# Patient Record
Sex: Male | Born: 1940 | Smoking: Former smoker
Health system: Southern US, Community
[De-identification: ages and names within clinical notes are randomized; demographics above are authoritative.]

## PROBLEM LIST (undated history)

## (undated) DIAGNOSIS — E119 Type 2 diabetes mellitus without complications: Secondary | ICD-10-CM

## (undated) DIAGNOSIS — N200 Calculus of kidney: Secondary | ICD-10-CM

## (undated) DIAGNOSIS — R112 Nausea with vomiting, unspecified: Secondary | ICD-10-CM

## (undated) DIAGNOSIS — Z9889 Other specified postprocedural states: Secondary | ICD-10-CM

## (undated) HISTORY — PX: SPLENECTOMY: SUR1306

## (undated) HISTORY — PX: KIDNEY SURGERY: SHX687

---

## 1998-03-15 ENCOUNTER — Encounter: Admission: RE | Admit: 1998-03-15 | Discharge: 1998-06-13 | Payer: Self-pay | Admitting: Emergency Medicine

## 2003-04-19 ENCOUNTER — Ambulatory Visit (HOSPITAL_COMMUNITY): Admission: RE | Admit: 2003-04-19 | Discharge: 2003-04-19 | Payer: Self-pay | Admitting: Family Medicine
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
## 2008-10-26 ENCOUNTER — Ambulatory Visit: Payer: Self-pay | Admitting: Family Medicine

## 2008-10-26 DIAGNOSIS — E119 Type 2 diabetes mellitus without complications: Secondary | ICD-10-CM

## 2008-10-26 DIAGNOSIS — R9431 Abnormal electrocardiogram [ECG] [EKG]: Secondary | ICD-10-CM | POA: Insufficient documentation

## 2008-10-26 DIAGNOSIS — E663 Overweight: Secondary | ICD-10-CM | POA: Insufficient documentation

## 2008-10-26 DIAGNOSIS — R5383 Other fatigue: Secondary | ICD-10-CM

## 2008-10-26 DIAGNOSIS — N3000 Acute cystitis without hematuria: Secondary | ICD-10-CM | POA: Insufficient documentation

## 2008-10-26 DIAGNOSIS — R5381 Other malaise: Secondary | ICD-10-CM

## 2008-10-26 DIAGNOSIS — F17200 Nicotine dependence, unspecified, uncomplicated: Secondary | ICD-10-CM

## 2008-10-26 DIAGNOSIS — D239 Other benign neoplasm of skin, unspecified: Secondary | ICD-10-CM | POA: Insufficient documentation

## 2008-10-26 DIAGNOSIS — R05 Cough: Secondary | ICD-10-CM | POA: Insufficient documentation

## 2008-10-27 ENCOUNTER — Encounter: Payer: Self-pay | Admitting: Family Medicine

## 2008-10-27 LAB — CONVERTED CEMR LAB: Microalb Creat Ratio: 24.6 mg/g (ref 0.0–30.0)

## 2010-05-07 LAB — CONVERTED CEMR LAB
ALT: 17 units/L (ref 0–53)
AST: 13 units/L (ref 0–37)
Basophils Absolute: 0 10*3/uL (ref 0.0–0.1)
Cholesterol: 207 mg/dL — ABNORMAL HIGH (ref 0–200)
Glucose, Bld: 230 mg/dL
Glucose, Urine, Semiquant: 500
Hgb A1c MFr Bld: 10.8 % — ABNORMAL HIGH (ref 4.6–6.1)
Indirect Bilirubin: 0.4 mg/dL (ref 0.0–0.9)
Lymphocytes Relative: 30 % (ref 12–46)
Neutro Abs: 7 10*3/uL (ref 1.7–7.7)
Neutrophils Relative %: 63 % (ref 43–77)
Platelets: 250 10*3/uL (ref 150–400)
Potassium: 4.2 meq/L (ref 3.5–5.3)
RDW: 13.8 % (ref 11.5–15.5)
Sodium: 138 meq/L (ref 135–145)
Specific Gravity, Urine: >=1.03
TSH: 0.903 microintl units/mL (ref 0.350–4.500)
Total CHOL/HDL Ratio: 5.4
Total Protein: 7.3 g/dL (ref 6.0–8.3)
Triglycerides: 304 mg/dL — ABNORMAL HIGH (ref <150)
VLDL: 61 mg/dL — ABNORMAL HIGH (ref 0–40)
WBC Urine, dipstick: NEGATIVE
pH: 5

## 2012-04-27 ENCOUNTER — Emergency Department (HOSPITAL_COMMUNITY)
Admission: EM | Admit: 2012-04-27 | Discharge: 2012-04-27 | Discharge status: Against Medical Advice | Payer: Medicare Other | Attending: Emergency Medicine | Admitting: Emergency Medicine

## 2012-04-27 ENCOUNTER — Emergency Department (HOSPITAL_COMMUNITY): Payer: Medicare Other

## 2012-04-27 ENCOUNTER — Encounter (HOSPITAL_COMMUNITY): Payer: Self-pay | Admitting: *Deleted

## 2012-04-27 DIAGNOSIS — N3289 Other specified disorders of bladder: Secondary | ICD-10-CM

## 2012-04-27 DIAGNOSIS — F17200 Nicotine dependence, unspecified, uncomplicated: Secondary | ICD-10-CM | POA: Insufficient documentation

## 2012-04-27 DIAGNOSIS — E119 Type 2 diabetes mellitus without complications: Secondary | ICD-10-CM | POA: Insufficient documentation

## 2012-04-27 DIAGNOSIS — R300 Dysuria: Secondary | ICD-10-CM | POA: Insufficient documentation

## 2012-04-27 DIAGNOSIS — Z87442 Personal history of urinary calculi: Secondary | ICD-10-CM | POA: Insufficient documentation

## 2012-04-27 DIAGNOSIS — R9389 Abnormal findings on diagnostic imaging of other specified body structures: Secondary | ICD-10-CM | POA: Insufficient documentation

## 2012-04-27 DIAGNOSIS — N390 Urinary tract infection, site not specified: Secondary | ICD-10-CM | POA: Insufficient documentation

## 2012-04-27 DIAGNOSIS — N509 Disorder of male genital organs, unspecified: Secondary | ICD-10-CM | POA: Insufficient documentation

## 2012-04-27 DIAGNOSIS — R319 Hematuria, unspecified: Secondary | ICD-10-CM

## 2012-04-27 HISTORY — DX: Calculus of kidney: N20.0

## 2012-04-27 HISTORY — DX: Type 2 diabetes mellitus without complications: E11.9

## 2012-04-27 LAB — CBC WITH DIFFERENTIAL/PLATELET
Eosinophils Absolute: 0 10*3/uL (ref 0.0–0.7)
Eosinophils Relative: 0 % (ref 0–5)
HCT: 41.7 % (ref 39.0–52.0)
Hemoglobin: 14.7 g/dL (ref 13.0–17.0)
Lymphocytes Relative: 31 % (ref 12–46)
Lymphs Abs: 3.4 10*3/uL (ref 0.7–4.0)
MCH: 32.2 pg (ref 26.0–34.0)
MCV: 91.4 fL (ref 78.0–100.0)
Monocytes Relative: 7 % (ref 3–12)
RBC: 4.56 MIL/uL (ref 4.22–5.81)
WBC: 10.9 10*3/uL — ABNORMAL HIGH (ref 4.0–10.5)

## 2012-04-27 LAB — URINALYSIS, ROUTINE W REFLEX MICROSCOPIC
Nitrite: POSITIVE — AB
Specific Gravity, Urine: 1.025 (ref 1.005–1.030)
pH: 5.5 (ref 5.0–8.0)

## 2012-04-27 LAB — BASIC METABOLIC PANEL
CO2: 27 mEq/L (ref 19–32)
Chloride: 100 mEq/L (ref 96–112)
Glucose, Bld: 237 mg/dL — ABNORMAL HIGH (ref 70–99)
Potassium: 3.7 mEq/L (ref 3.5–5.1)
Sodium: 136 mEq/L (ref 135–145)

## 2012-04-27 LAB — URINE MICROSCOPIC-ADD ON

## 2012-04-27 IMAGING — US US SCROTUM
1 series · 13 of 25 positions shown · non-contrast
Comparison: Stone study of earlier today.

CLINICAL DATA: Testicular pain. Right greater than left.
Swelling.  Rule out torsion.

SCROTAL ULTRASOUND
DOPPLER ULTRASOUND OF THE TESTICLES
TECHNIQUE: Complete ultrasound examination of the testicles,
epididymis, and other scrotal structures was performed.  Color and
spectral Doppler ultrasound were also utilized to evaluate blood
flow to the testicles.

[Series 1: us scrotum · 0.07mm/px · 13 of 60 slices shown]
[im 1/60]
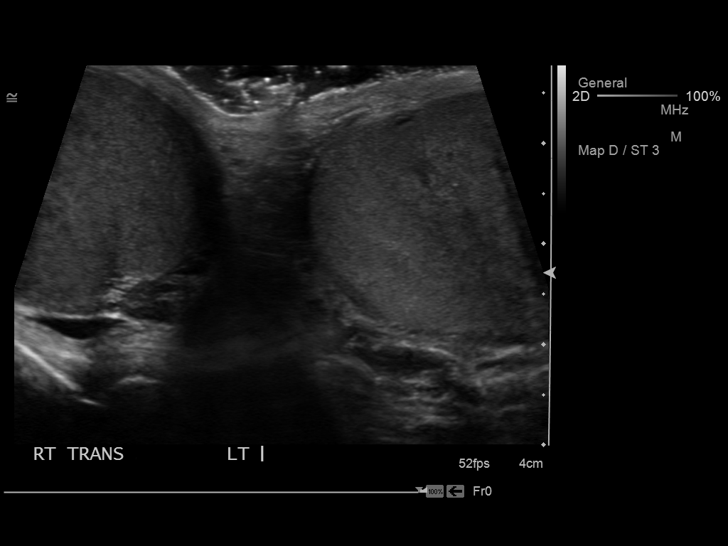
[im 5/60]
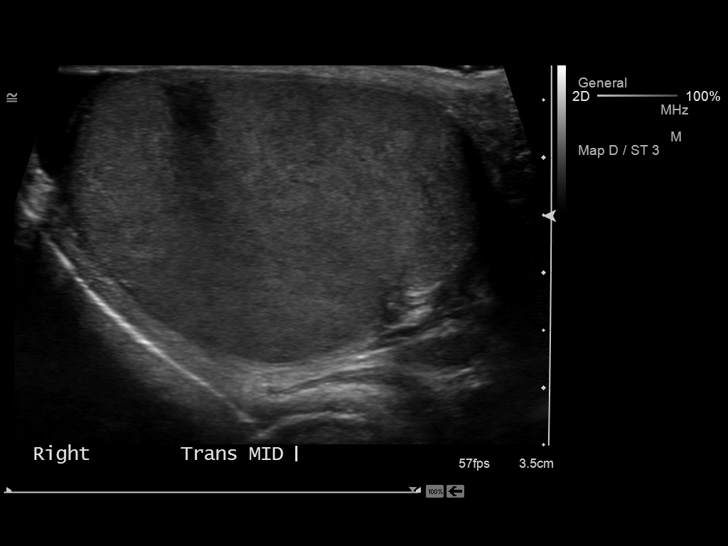
[im 10/60]
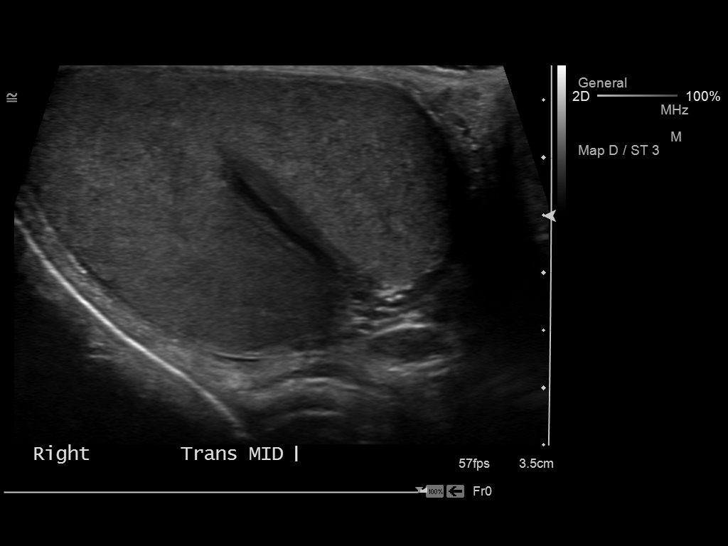
[im 15/60]
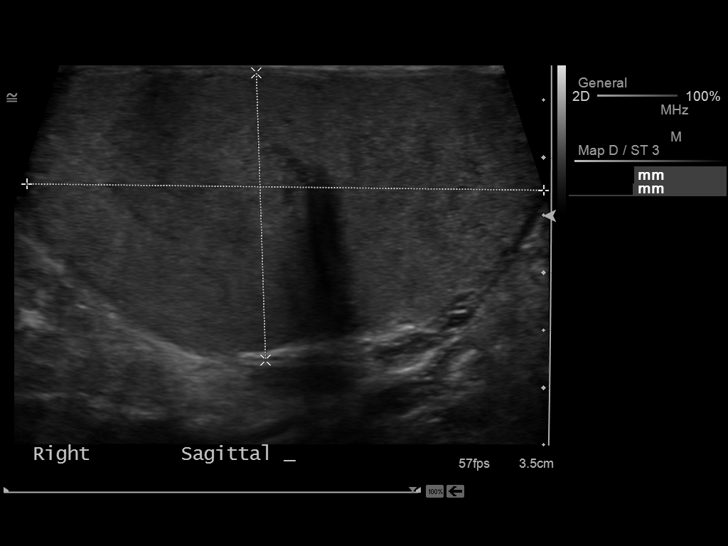
[im 20/60]
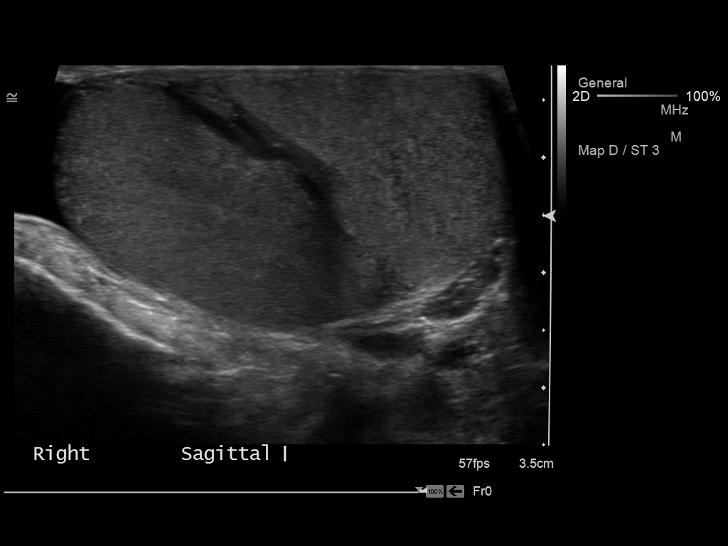
[im 25/60]
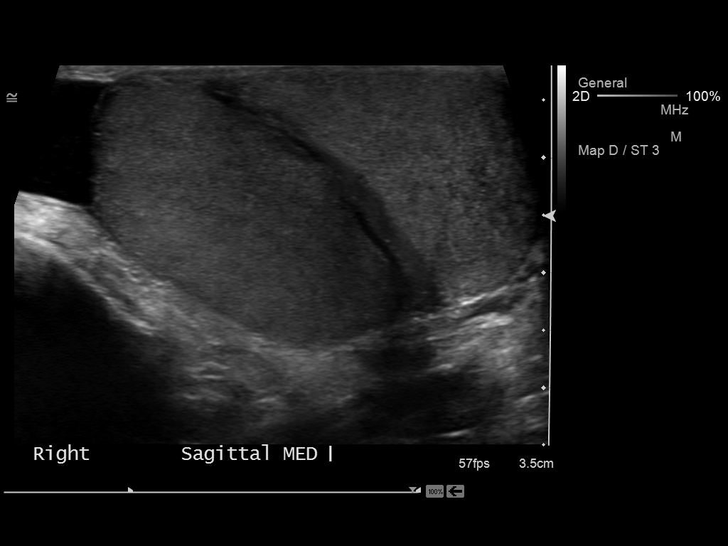
[im 30/60]
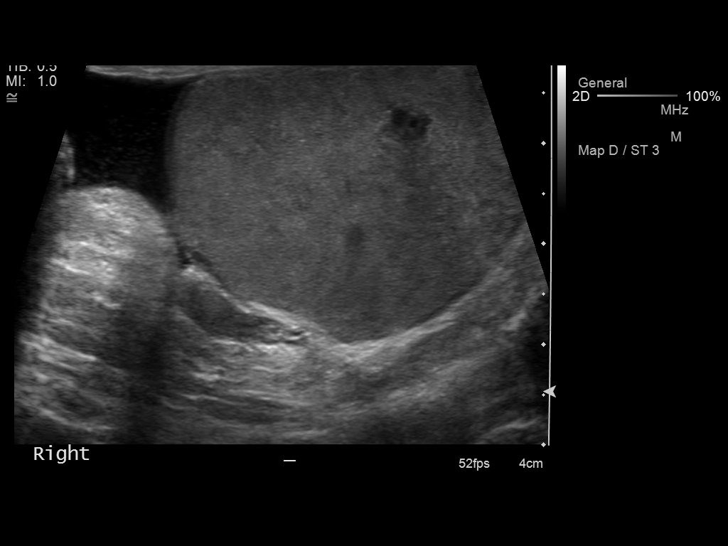
[im 35/60]
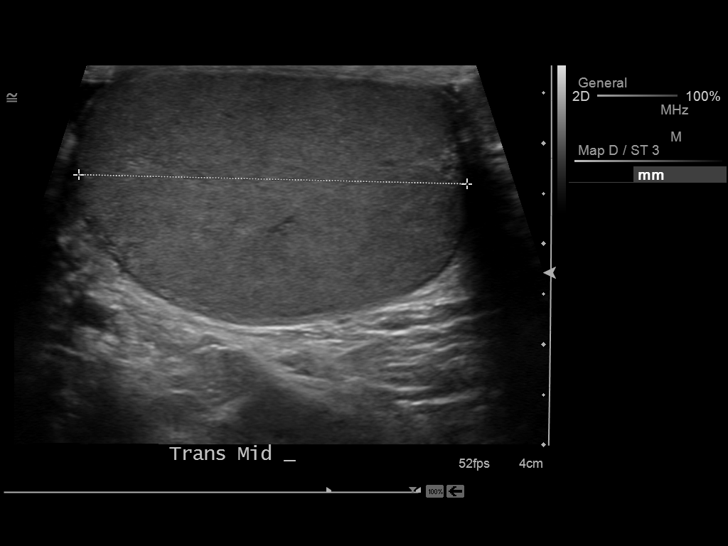
[im 40/60]
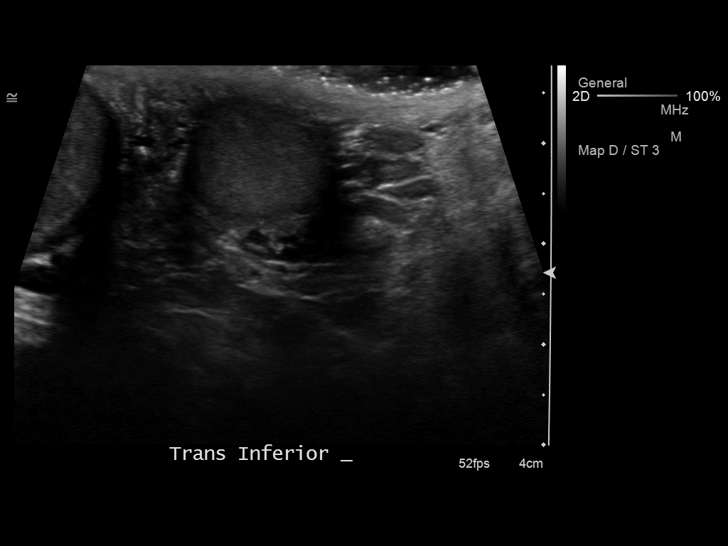
[im 45/60]
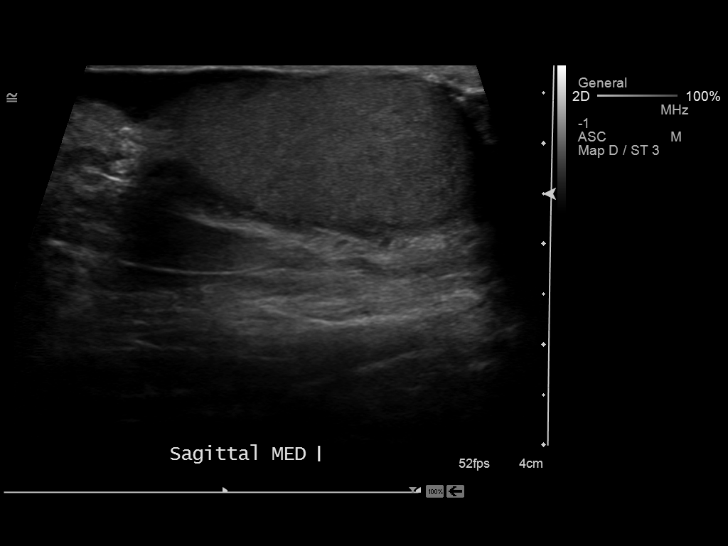
[im 50/60]
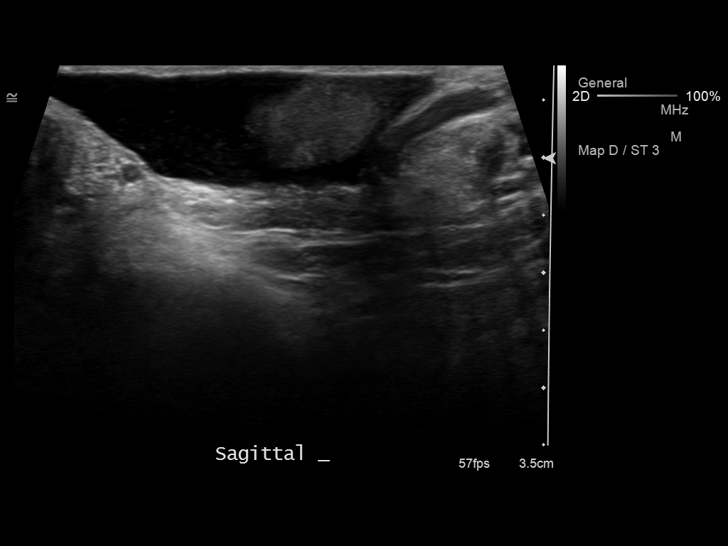
[im 55/60]
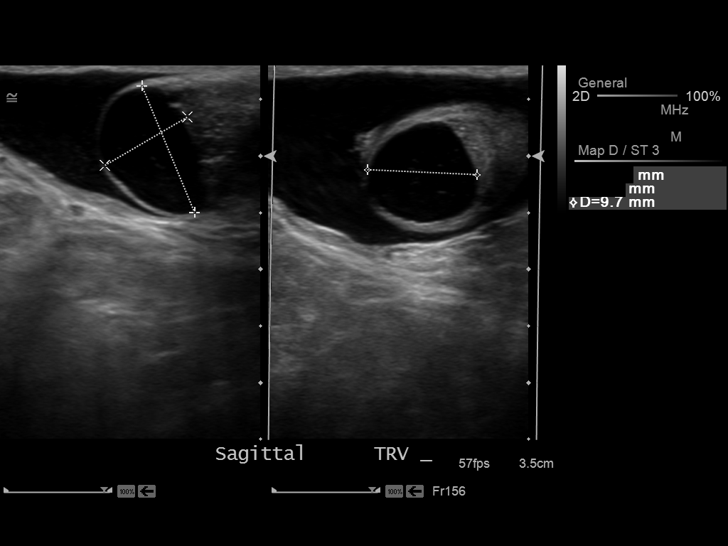
[im 60/60]
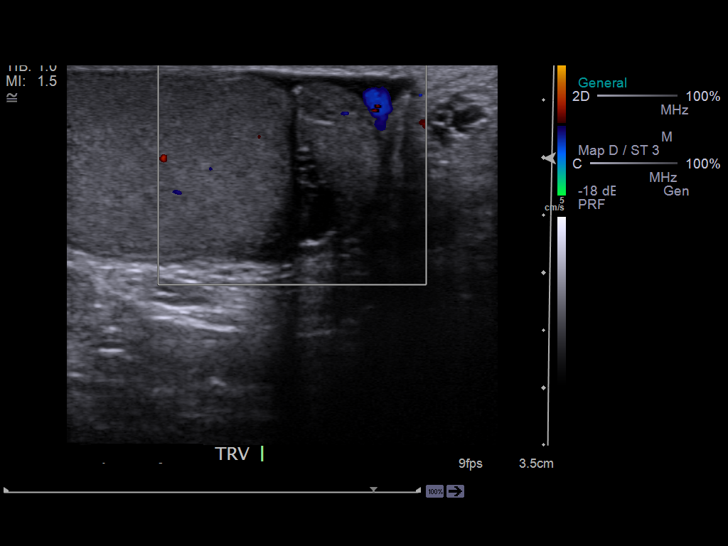

[13 of 25 positions shown; findings below may reference images not displayed]

FINDINGS: Right testis:  Normal in gray scale and color appearance.

Left testis:  Minimal testicular microlithiasis.  Otherwise normal
in size and appearance.  Normal color Doppler appearance.

Right epididymis:  Normal in size and appearance.

Left epididymis:  1.2 cm epididymal cyst versus spermatocele.

Hydrocele:  Small bilateral.

Varicocele:  Absent

Pulsed Doppler interrogation of both testes demonstrates low
resistance flow bilaterally.
IMPRESSION: 1. No acute process or explanation for testicular pain/swelling.
2.  Small bilateral hydroceles.
3.  Left epididymal cyst versus spermatocele.
4. Minimal left-sided testicular microlithiasis.  Current
literature suggests that testicular microlithiasis is not a
significant independent risk factor for development of testicular
carcinoma, and that followup imaging is not warranted in the
absence of other risk factors.  Monthly testicular self-examination
and annual physical exams are considered appropriate surveillance.
If patient has other risk factors for testicular carcinoma, then
referral to Urology should be considered.  (Reference:  ROBERTO,
et al.:  A 5-Year Followup Study of Asymptomatic Men with
Testicular Microlithiasis.  J Urol [SU]; 179:[PHONE_NUMBER].)

## 2012-04-27 IMAGING — CT CT ABD-PELV W/O CM
2 of 3 series · 17 of 46 positions shown, 19 images · non-contrast
Comparison: None.

CLINICAL DATA: Abdominal and pelvic pain and hematuria.

CT ABDOMEN AND PELVIS WITHOUT CONTRAST
TECHNIQUE: Multidetector CT imaging of the abdomen and pelvis was
performed following the standard protocol without intravenous
contrast.

[Series 2: standard/full over (age)lbs 5.0 · axial · 0.72mm/px · z∈[-451,-36]mm · 14 of 97 slices shown, 16 images]
[im 7/97  soft-tissue]
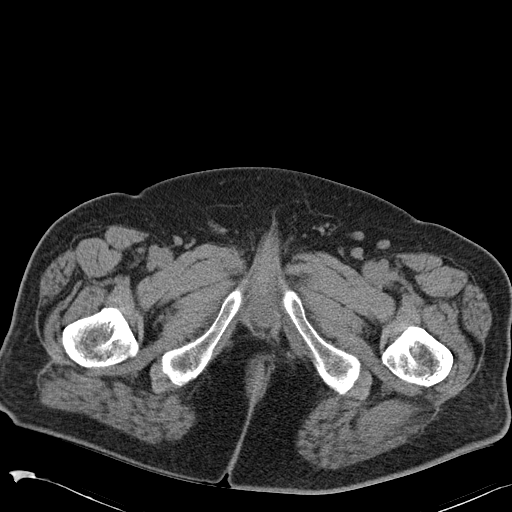
[im 7/97  bone]
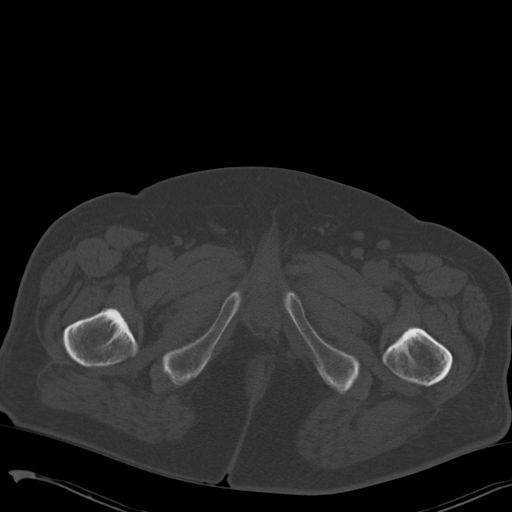
[im 13/97  soft-tissue]
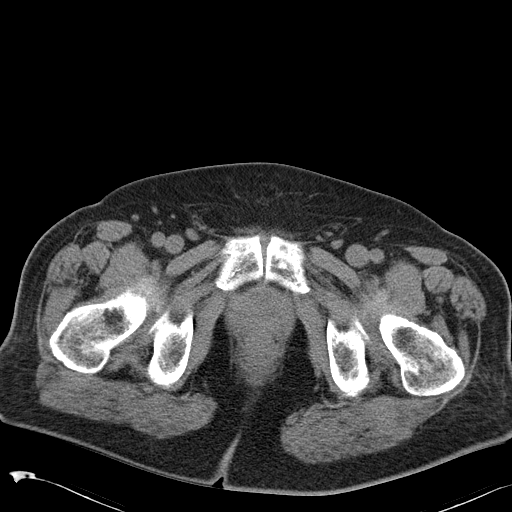
[im 19/97  soft-tissue]
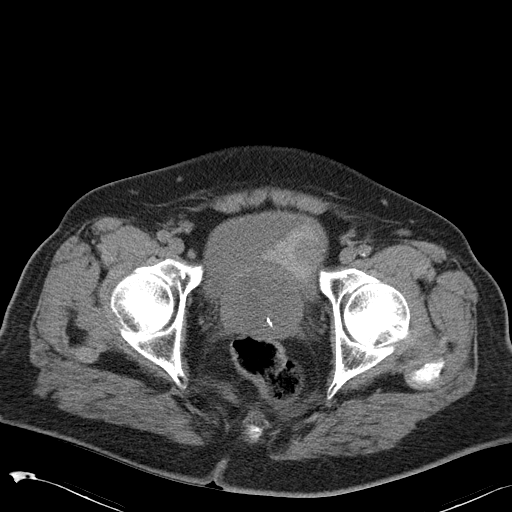
[im 25/97  soft-tissue]
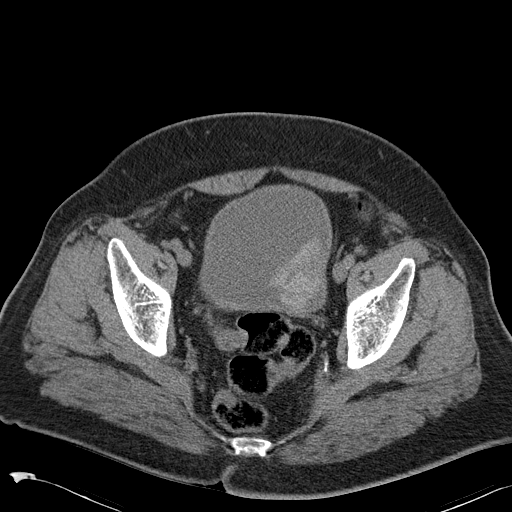
[im 31/97  soft-tissue]
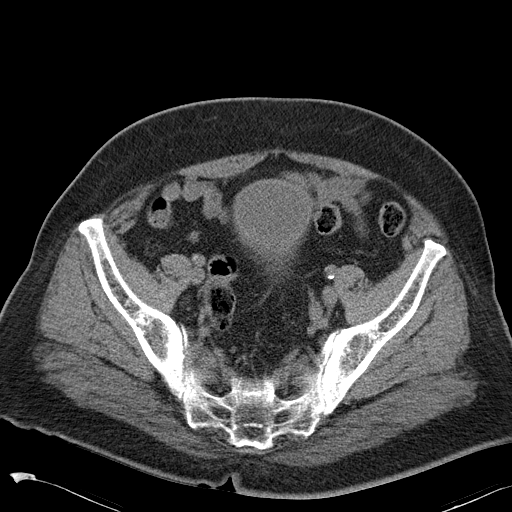
[im 38/97  soft-tissue]
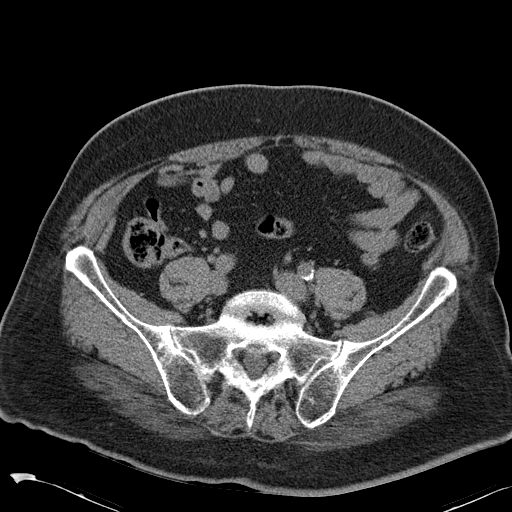
[im 44/97  soft-tissue]
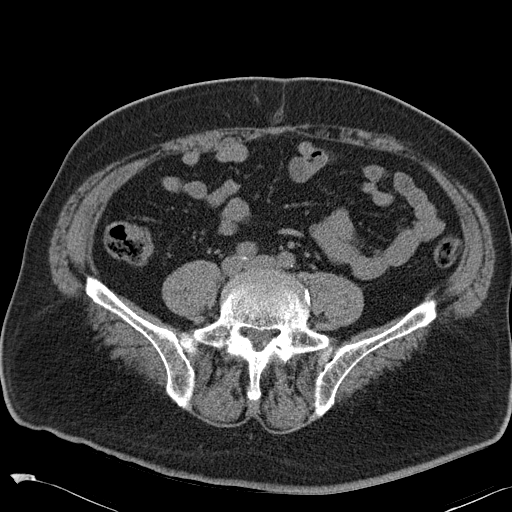
[im 53/97  soft-tissue]
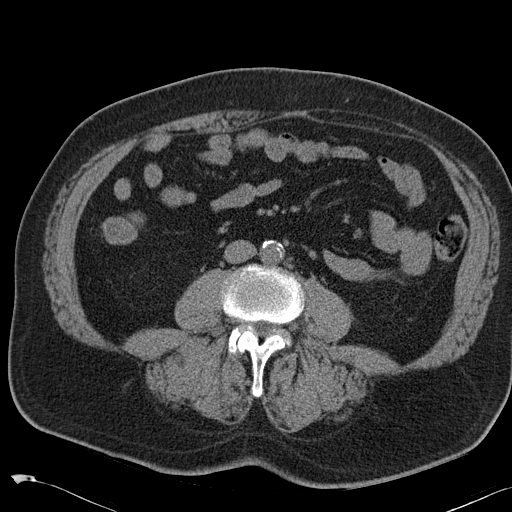
[im 59/97  soft-tissue]
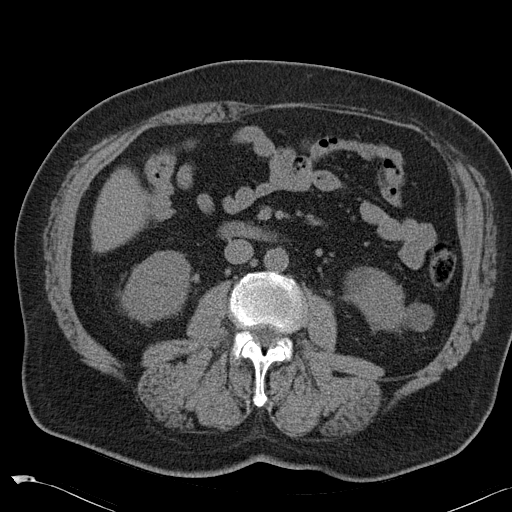
[im 59/97  bone]
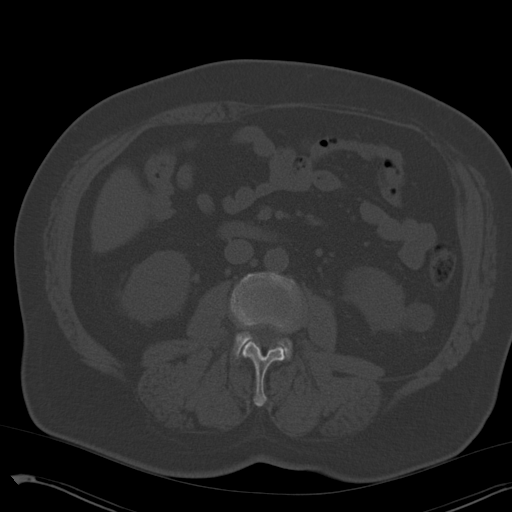
[im 66/97  soft-tissue]
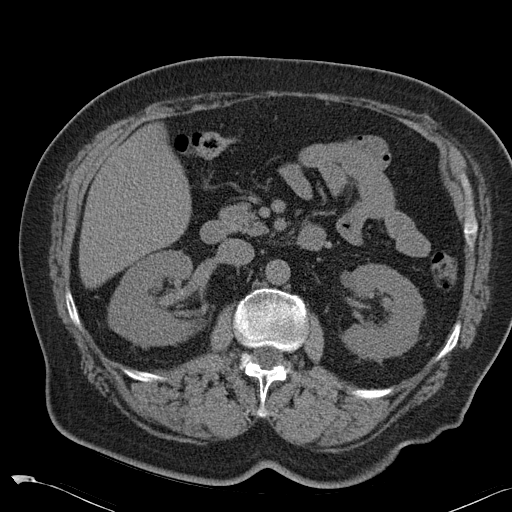
[im 72/97  soft-tissue]
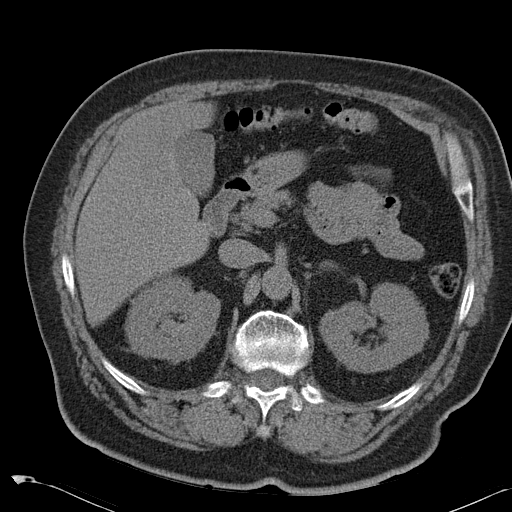
[im 78/97  soft-tissue]
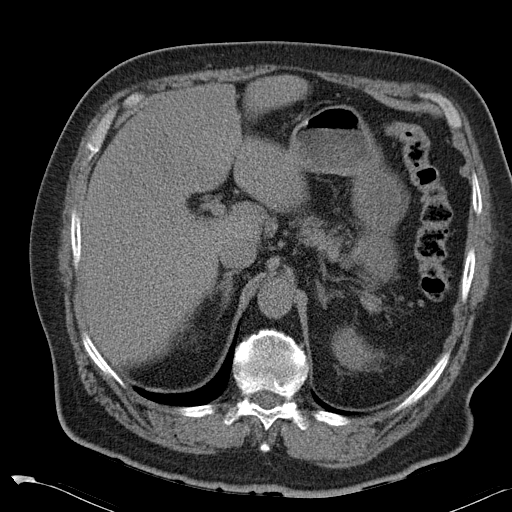
[im 84/97  soft-tissue]
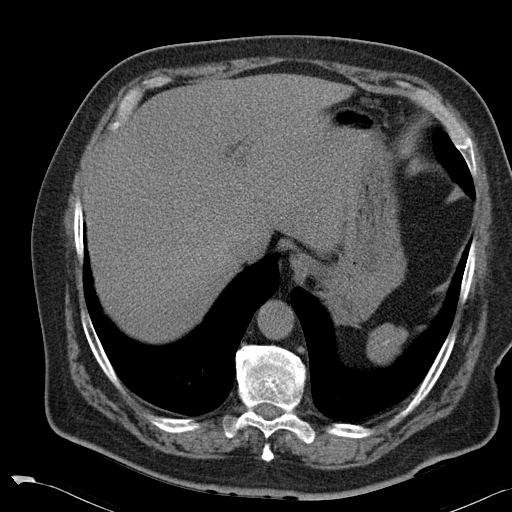
[im 90/97  soft-tissue]
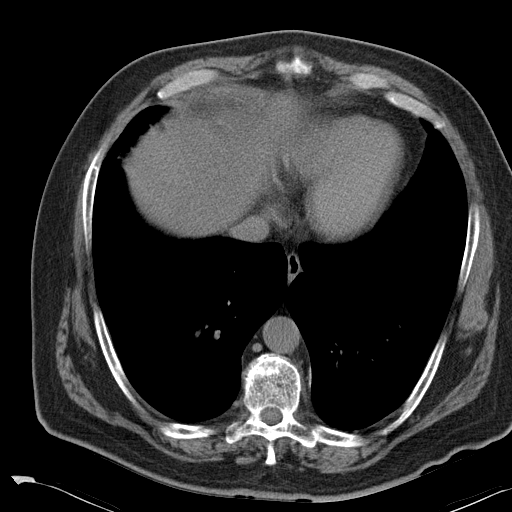

[Series 4: mpr coronal · coronal · 0.95mm/px · 3 of 102 slices shown]
[im 34/102  soft-tissue]
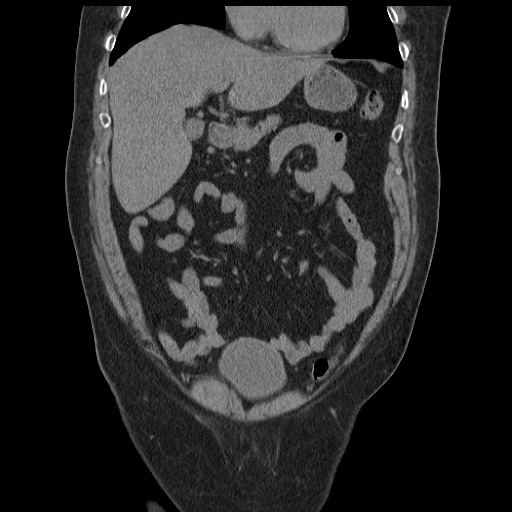
[im 45/102  soft-tissue]
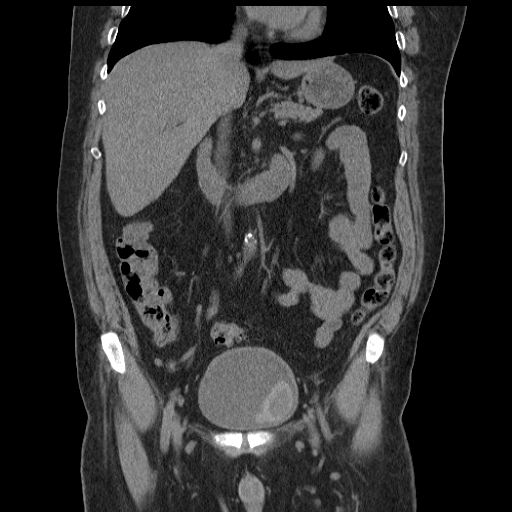
[im 57/102  soft-tissue]
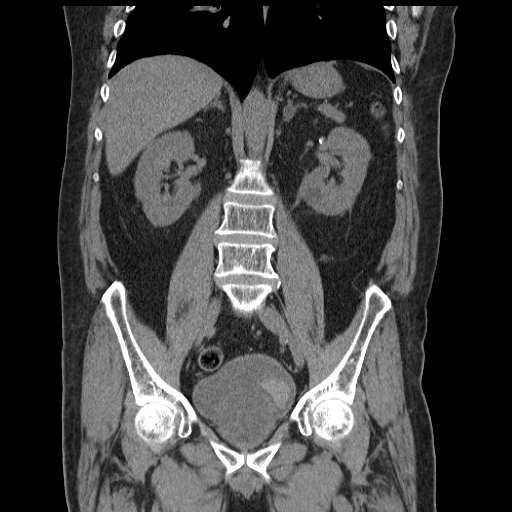

[17 of 46 positions shown; findings below may reference images not displayed]

FINDINGS: The lung bases are clear.  The heart is normal in size.
No pericardial effusion.  The distal esophagus is grossly normal as
is the distal descending thoracic aorta.

The liver is unremarkable.  No focal hepatic lesions or
intrahepatic biliary dilatation.  Gallbladder is normal.  No common
bile duct dilatation.  The pancreas is normal.  The spleen has been
surgically removed.  Accessory regenerated splenic tissue is noted
in the left upper quadrant.  Small left adrenal gland nodules are
consistent with benign adenomas.

There are renal arteriole calcifications but no renal or
obstructing ureteral calculi.  There is an ill-defined area of high
attenuation in the left aspect of the bladder.  Findings suspicious
for a bladder mass with surrounding hemorrhage.  The prostate gland
is mildly enlarged.  The seminal vesicles appear normal.  No pelvic
adenopathy.

The aorta is normal in caliber.  Scattered atherosclerotic
calcifications.  No mesenteric or retroperitoneal mass or
adenopathy.  Small scattered nodes are noted.

The bony structures are unremarkable.
IMPRESSION: 1.  CT findings suspicious for a left-sided bladder mass with
surrounding hemorrhage.
2.  Left renal cysts and left adrenal gland adenomas.

## 2012-04-27 MED ORDER — CIPROFLOXACIN IN D5W 400 MG/200ML IV SOLN
400.0000 mg | Freq: Once | INTRAVENOUS | Status: AC
  Administered 2012-04-27: 400 mg via INTRAVENOUS
  Filled 2012-04-27: qty 200

## 2012-04-27 MED ORDER — CIPROFLOXACIN HCL 500 MG PO TABS: 500.0000 mg | ORAL_TABLET | Freq: Two times a day (BID) | ORAL | Status: DC

## 2012-04-27 MED ORDER — ONDANSETRON HCL 4 MG/2ML IJ SOLN: 4.0000 mg | INTRAMUSCULAR | Status: DC | PRN

## 2012-04-27 MED ORDER — MORPHINE SULFATE 4 MG/ML IJ SOLN: 4.0000 mg | INTRAMUSCULAR | Status: DC | PRN

## 2012-04-27 MED ORDER — SODIUM CHLORIDE 0.9 % IV SOLN
INTRAVENOUS | Status: DC
  Administered 2012-04-27: 14:00:00 via INTRAVENOUS

## 2012-04-27 NOTE — ED Notes (Signed)
Hematuria with clots x 2 wks and testicle pain/swelling.

## 2012-04-27 NOTE — ED Notes (Signed)
Waiting for ax to finish before  Signing pt out.

## 2012-04-27 NOTE — ED Provider Notes (Signed)
History     CSN: [id number]  Arrival date & time 04/27/12  1059   First MD Initiated Contact with Patient 04/27/12 1316      Chief Complaint  Patient presents with  . Hematuria     HPI Pt was seen at 1320.   Per pt, c/o gradual onset and persistence of constant hematuria for the past 2 to 3 weeks.  Has been associated with dysuria and waxing and waning R>L testicular "pain."  Denies abd pain, no back pain, no N/V/D, no fevers, no rash, no penile drainage, no injury.    Past Medical History  Diagnosis Date  . Diabetes mellitus without complication   . Kidney stones     Past Surgical History  Procedure Date  . Splenectomy   . Kidney surgery     sutures      History  Substance Use Topics  . Smoking status: Current Every Day Smoker    Types: Cigarettes  . Smokeless tobacco: Not on file  . Alcohol Use: No      Review of Systems ROS: Statement: All systems negative except as marked or noted in the HPI; Constitutional: Negative for fever and chills. ; ; Eyes: Negative for eye pain, redness and discharge. ; ; ENMT: Negative for ear pain, hoarseness, nasal congestion, sinus pressure and sore throat. ; ; Cardiovascular: Negative for chest pain, palpitations, diaphoresis, dyspnea and peripheral edema. ; ; Respiratory: Negative for cough, wheezing and stridor. ; ; Gastrointestinal: Negative for nausea, vomiting, diarrhea, abdominal pain, blood in stool, hematemesis, jaundice and rectal bleeding. . ; ; Genitourinary: Negative for flank pain, +dysuria, hematuria. ; ; Genital:  No penile drainage or rash,+right testicular pain, no swelling, no perineal pain, no scrotal rash or swelling.;;  Musculoskeletal: Negative for back pain and neck pain. Negative for swelling and trauma.; ; Skin: Negative for pruritus, rash, abrasions, blisters, bruising and skin lesion.; ; Neuro: Negative for headache, lightheadedness and neck stiffness. Negative for weakness, altered level of consciousness ,  altered mental status, extremity weakness, paresthesias, involuntary movement, seizure and syncope.       Allergies  Review of patient's allergies indicates no known allergies.  Home Medications  No current outpatient prescriptions on file.  BP 138/67  Pulse 76  Temp 97.7 F (36.5 C) (Oral)  Resp 20  Ht 5\' 8"  (1.727 m)  Wt 205 lb (92.987 kg)  BMI 31.17 kg/m2  SpO2 95%  Physical Exam 1325: Physical examination:  Nursing notes reviewed; Vital signs and O2 SAT reviewed;  Constitutional: Well developed, Well nourished, Well hydrated, In no acute distress; Head:  Normocephalic, atraumatic; Eyes: EOMI, PERRL, No scleral icterus; ENMT: Mouth and pharynx normal, Mucous membranes moist; Neck: Supple, Full range of motion, No lymphadenopathy; Cardiovascular: Regular rate and rhythm, No murmur, rub, or gallop; Respiratory: Breath sounds clear & equal bilaterally, No rales, rhonchi, wheezes.  Speaking full sentences with ease, Normal respiratory effort/excursion; Chest: Nontender, Movement normal; Abdomen: Soft, Nontender, Nondistended, Normal bowel sounds; Genitourinary: No CVA tenderness; Genital exam performed with pt permission and male ED RN chaparone present during exam. No perineal erythema, soft tissue crepitus or tenderness to palp.  No penile lesions or drainage.  No scrotal erythema, edema or tenderness to palp.  Normal testicular lie.  No testicular tenderness to palp.  +cremasteric reflexes bilat.  No inguinal LAN or palpable masses.;;  Extremities: Pulses normal, No tenderness, No edema, No calf edema or asymmetry.; Neuro: AA&Ox3, Major CN grossly intact.  Speech clear. No gross focal  motor or sensory deficits in extremities.; Skin: Color normal, Warm, Dry.   ED Course  Procedures    MDM  MDM Reviewed: previous chart, nursing note and vitals Interpretation: labs, x-ray and ultrasound   Results for orders placed during the hospital encounter of 04/27/12  URINALYSIS, ROUTINE W  REFLEX MICROSCOPIC      Component Value Range   Color, Urine AMBER (*) YELLOW   APPearance HAZY (*) CLEAR   Specific Gravity, Urine 1.025  1.005 - 1.030   pH 5.5  5.0 - 8.0   Glucose, UA >1000 (*) NEGATIVE mg/dL   Hgb urine dipstick LARGE (*) NEGATIVE   Bilirubin Urine SMALL (*) NEGATIVE   Ketones, ur 15 (*) NEGATIVE mg/dL   Protein, ur >409 (*) NEGATIVE mg/dL   Urobilinogen, UA 0.2  0.0 - 1.0 mg/dL   Nitrite POSITIVE (*) NEGATIVE   Leukocytes, UA NEGATIVE  NEGATIVE  URINE MICROSCOPIC-ADD ON      Component Value Range   WBC, UA 11-20  <3 WBC/hpf   RBC / HPF 21-50  <3 RBC/hpf   Bacteria, UA MANY (*) RARE  BASIC METABOLIC PANEL      Component Value Range   Sodium 136  135 - 145 mEq/L   Potassium 3.7  3.5 - 5.1 mEq/L   Chloride 100  96 - 112 mEq/L   CO2 27  19 - 32 mEq/L   Glucose, Bld 237 (*) 70 - 99 mg/dL   BUN 13  6 - 23 mg/dL   Creatinine, Ser 8.11  0.50 - 1.35 mg/dL   Calcium 9.6  8.4 - 91.4 mg/dL   GFR calc non Af Amer >90  >90 mL/min   GFR calc Af Amer >90  >90 mL/min  CBC WITH DIFFERENTIAL      Component Value Range   WBC 10.9 (*) 4.0 - 10.5 K/uL   RBC 4.56  4.22 - 5.81 MIL/uL   Hemoglobin 14.7  13.0 - 17.0 g/dL   HCT 78.2  95.6 - 21.3 %   MCV 91.4  78.0 - 100.0 fL   MCH 32.2  26.0 - 34.0 pg   MCHC 35.3  30.0 - 36.0 g/dL   RDW 08.6  57.8 - 46.9 %   Platelets 306  150 - 400 K/uL   Neutrophils Relative 61  43 - 77 %   Neutro Abs 6.6  1.7 - 7.7 K/uL   Lymphocytes Relative 31  12 - 46 %   Lymphs Abs 3.4  0.7 - 4.0 K/uL   Monocytes Relative 7  3 - 12 %   Monocytes Absolute 0.8  0.1 - 1.0 K/uL   Eosinophils Relative 0  0 - 5 %   Eosinophils Absolute 0.0  0.0 - 0.7 K/uL   Basophils Relative 1  0 - 1 %   Basophils Absolute 0.1  0.0 - 0.1 K/uL   Ct Abdomen Pelvis Wo Contrast 04/27/2012  *RADIOLOGY REPORT*  Clinical Data: Abdominal and pelvic pain and hematuria.  CT ABDOMEN AND PELVIS WITHOUT CONTRAST  Technique:  Multidetector CT imaging of the abdomen and pelvis was  performed following the standard protocol without intravenous contrast.  Comparison: None.  Findings: The lung bases are clear.  The heart is normal in size. No pericardial effusion.  The distal esophagus is grossly normal as is the distal descending thoracic aorta.  The liver is unremarkable.  No focal hepatic lesions or intrahepatic biliary dilatation.  Gallbladder is normal.  No common bile duct dilatation.  The pancreas  is normal.  The spleen has been surgically removed.  Accessory regenerated splenic tissue is noted in the left upper quadrant.  Small left adrenal gland nodules are consistent with benign adenomas.  There are renal arteriole calcifications but no renal or obstructing ureteral calculi.  There is an ill-defined area of high attenuation in the left aspect of the bladder.  Findings suspicious for a bladder mass with surrounding hemorrhage.  The prostate gland is mildly enlarged.  The seminal vesicles appear normal.  No pelvic adenopathy.  The aorta is normal in caliber.  Scattered atherosclerotic calcifications.  No mesenteric or retroperitoneal mass or adenopathy.  Small scattered nodes are noted.  The bony structures are unremarkable.  IMPRESSION:  1.  CT findings suspicious for a left-sided bladder mass with surrounding hemorrhage. 2.  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   Original Report Authenticated By: Rudie Meyer, M.D.    US Scrotum 04/27/2012  *RADIOLOGY REPORT*  Clinical Data:  Testicular pain. Right greater than left. Swelling.  Rule out torsion.  SCROTAL ULTRASOUND DOPPLER ULTRASOUND OF THE TESTICLES  Technique: Complete ultrasound examination of the testicles, epididymis, and other scrotal structures was performed.  Color and spectral Doppler ultrasound were also utilized to evaluate blood flow to the testicles.  Comparison:  Stone study of earlier today.  Findings:  Right testis:  Normal in gray scale and color appearance.  Left testis:  Minimal testicular  microlithiasis.  Otherwise normal in size and appearance.  Normal color Doppler appearance.  Right epididymis:  Normal in size and appearance.  Left epididymis:  1.2 cm epididymal cyst versus spermatocele.  Hydrocele:  Small bilateral.  Varicocele:  Absent  Pulsed Doppler interrogation of both testes demonstrates low resistance flow bilaterally.  IMPRESSION:  1. No acute process or explanation for testicular pain/swelling. 2.  Small bilateral hydroceles. 3.  Left epididymal cyst versus spermatocele. 4. Minimal left-sided testicular microlithiasis.  Current literature suggests that testicular microlithiasis is not a significant independent risk factor for development of testicular carcinoma, and that followup imaging is not warranted in the absence of other risk factors.  Monthly testicular self-examination and annual physical exams are considered appropriate surveillance. If patient has other risk factors for testicular carcinoma, then referral to Urology should be considered.  (Reference:  DeCastro, et al.:  A 5-Year Followup Study of Asymptomatic Men with Testicular Microlithiasis.  J Urol 2008; 179:1420-1423.)   Original Report Authenticated By: Jeronimo Greaves, M.D.      1515:   T/C to Uro Dr. Jerre Simon, case discussed, including:  HPI, pertinent PM/SHx, VS/PE, dx testing, ED course and treatment:  requests to admit to hospital service and he will scope tomorrow, obtain UC and tx UTI with cipro.    1530:  Dx and testing d/w pt and family.  Questions answered.  Verb understanding, agreeable to admit. T/C to [facility] Dr. Kerry Hough, case discussed, including:  HPI, pertinent PM/SHx, VS/PE, dx testing, ED course and treatment:  Agreeable to admit, requests to write temporary orders, obtain medical bed to team 2.  1550:  Pt now states he "has to go home and take care of the dogs."  ED RN and myself, as well as his ex-wife, encouraged pt to make arrangements with friends/family to do same.  Pt refuses to do so saying he has  to do it himself, and continues to refuse admission.  I encouraged pt to stay to have his bleeding bladder mass further evaluated, but continues to refuse.  Pt makes his own medical  decisions.  Risks of AMA explained to pt and family, including, but not limited to:  uncontrolled hemorrhage, cancer (local, metastatic), stroke, heart attack, cardiac arrythmia ("irregular heart rate/beat"), "passing out," temporary and/or permanent disability, death.  Pt and family verb understanding and continue to refuse admission, understanding the consequences of their decision.  I encouraged pt to follow up with the Uro MD tomorrow and return to the ED immediately if symptoms worsen, he changes his mind about admission, or for any other concerns.  Pt and family verb understanding, agreeable.              Laray Anger, DO 04/29/12 1323

## 2012-04-27 NOTE — ED Notes (Signed)
Pt states he has to go home and take care of things at home, atleast for tonight. He states there is no way he can stay at this time.

## 2012-04-28 LAB — URINE CULTURE

## 2012-05-01 ENCOUNTER — Encounter (HOSPITAL_COMMUNITY): Payer: Self-pay | Admitting: Pharmacy Technician

## 2012-05-05 ENCOUNTER — Encounter (HOSPITAL_COMMUNITY)
Admission: RE | Admit: 2012-05-05 | Discharge: 2012-05-05 | Disposition: A | Discharge status: Home / Self Care | Payer: Medicare Other | Source: Ambulatory Visit | Attending: Urology | Admitting: Urology

## 2012-05-05 ENCOUNTER — Encounter (HOSPITAL_COMMUNITY): Payer: Self-pay

## 2012-05-05 LAB — SURGICAL PCR SCREEN: MRSA, PCR: NEGATIVE

## 2012-05-05 NOTE — Patient Instructions (Addendum)
RAYSHUN KANDLER  05/05/2012   Your procedure is scheduled on:  05/08/2012  Report to [facility] at  900 AM.  Call this number if you have problems the morning of surgery: [phone]   Remember:   Do not eat food or drink liquids after midnight.   Take these medicines the morning of surgery with A SIP OF WATER: none   Do not wear jewelry, make-up or nail polish.  Do not wear lotions, powders, or perfumes.  Do not shave 48 hours prior to surgery. Men may shave face and neck.  Do not bring valuables to the hospital.  Contacts, dentures or bridgework may not be worn into surgery.  Leave suitcase in the car. After surgery it may be brought to your room.  For patients admitted to the hospital, checkout time is 11:00 AM the day of discharge.   Patients discharged the day of surgery will not be allowed to drive  home.  Name and phone number of your driver: family  Special Instructions: Shower using CHG 2 nights before surgery and the night before surgery.  If you shower the day of surgery use CHG.  Use special wash - you have one bottle of CHG for all showers.  You should use approximately 1/3 of the bottle for each shower.   Please read over the following fact sheets that you were given: Pain Booklet, Coughing and Deep Breathing, MRSA Information, Surgical Site Infection Prevention, Anesthesia Post-op Instructions and Care and Recovery After Surgery Transurethral Resection, Bladder Tumor A cancerous growth (tumor) can develop on the inside wall of the bladder. The bladder is the organ that holds urine. One way to remove the tumor is a procedure called a transurethral resection. The tumor is removed (resected) through the tube that carries urine from the bladder out of the body (urethra). No cuts (incisions) are made in the skin. Instead, the procedure is done through a thin telescope, called a resectoscope. Attached to it is a light and usually a tiny camera. The resectoscope is put into the  urethra. In men, the urethra opens at the end of the penis. In women, it opens just above the vagina.  A transurethral resection is usually used to remove tumors that have not gotten too big or too deep. These are called Stage 0, Stage 1 or Stage 2 bladder cancers. LET YOUR CAREGIVER KNOW ABOUT:  On the day of the procedure, your caregivers will need to know the last time you had anything to eat or drink. This includes water, gum, and candy. In advance, make sure they know about:   Any allergies.  All medications you are taking, including:  Herbs, eyedrops, over-the-counter medications and creams.  Blood thinners (anticoagulants), aspirin or other drugs that could affect blood clotting.  Use of steroids (by mouth or as creams).  Previous problems with anesthetics, including local anesthetics.  Possibility of pregnancy, if this applies.  Any history of blood clots.  Any history of bleeding or other blood problems.  Previous surgery.  Smoking history.  Any recent symptoms of colds or infections.  Other health problems. RISKS AND COMPLICATIONS This is usually a safe procedure. Every procedure has risks, though. For a transurethral resection, they include:  Infection. Antibiotic medication would need to be taken.  Bleeding.  Light bleeding may last for several days after the procedure.  If bleeding continues or is heavy, the bladder may need rinsing. Or, a new catheter might be put in for  awhile.  Sometimes bed rest is needed.  Urination problems.  Pain and burning can occur when urinating. This usually goes away in a few days.  Scarring from the procedure can block the flow of urine.  Bladder damage.  It can be punctured or torn during removal of the tumor. If this happens, a catheter might be needed for longer. Antibiotics would be taken while the bladder heals.  Urine can leak through the hole or tear into the abdomen. If this happens, surgery may be needed to  repair the bladder. BEFORE THE PROCEDURE   A medical evaluation will be done. This may include:  A physical examination.  Urine test. This is to make sure you do not have a urinary tract infection.  Blood tests.  A test that checks the heart's rhythm (electrocardiogram).  Talking with an anesthesiologist. This is the person who will be in charge of the medication (anesthesia) to keep you from feeling pain during the transurethral resection. You might be asleep during the procedure (general anesthesia) or numb from the waist down, but awake during the procedure (spinal anesthesia). Ask your surgeon what to expect.  The person who is having a transurethral resection needs to give what is called informed consent. This requires signing a legal paper that gives permission for the procedure. To give informed consent:  You must understand how the procedure is done and why.  You must be told all the risks and benefits of the procedure.  You must sign the consent. Sometimes a legal guardian can do this.  Signing should be witnessed by a healthcare professional.  The day before the surgery, eat only a light dinner. Then, do not eat or drink anything for at least 8 hours before the surgery. Ask your caregiver if it is OK to take any needed medicines with a sip of water.  Arrive at least an hour before the surgery or whenever your surgeon recommends. This will give you time to check in and fill out any needed paperwork. PROCEDURE  The preparation:  You will change into a hospital gown.  A needle will be inserted in your arm. This is an intravenous access tube (IV). Medication will be able to flow directly into your body through this needle.  Small monitors will be put on your body. They are used to check your heart, blood pressure, and oxygen level.  You might be given medication that will help you relax (sedative).  You will be given a general anesthetic or spinal anesthesia.  The  procedure:  Once you are asleep or numb from the waist down, your legs will be placed in stirrups.  The resectoscope will be passed through the urethra into the bladder.  Fluid will be passed through the resectoscope. This will fill the bladder with water.  The surgeon will examine the bladder through the scope. If the scope has a camera, it can take pictures from inside the bladder. They can be projected onto a TV screen.  The surgeon will use various tools to remove the tumor in small pieces. Sometimes a laser (a beam of light energy) is used. Other tools may use electric current.  A tube (catheter) will often be placed so that urine can drain into a bag outside the body. This process helps stop bleeding. This tube keeps blood clots from blocking the urethra.  The procedure usually takes 30 to 45 minutes. AFTER THE PROCEDURE   You will stay in a recovery area until the anesthesia has  worn off. Your blood pressure and pulse will be checked every so often. Then you will be taken to a hospital room.  You may continue to get fluids through the IV for awhile.  Some pain is normal. The catheter might be uncomfortable. Pain is usually not severe. If it is, ask for pain medicine.  Your urine may look bloody after a transurethral resection. This is normal.  If bleeding is heavy, a hospital caregiver may rinse out the bladder (irrigation) through the catheter.  Once the urine is clear, the catheter will be taken out.  You will need to stay in the hospital until you can urinate on your own.  Most people stay in the hospital for up to 4 days. PROGNOSIS   Transurethral resection is considered the best way to treat bladder tumors that are not too far along. For most people, the treatment is successful. Sometimes, though, more treatment is needed.  Bladder cancers can come back even after a successful procedure. Because of this, be sure to have a checkup with your caregiver every 3 to 6  months. If everything is OK for 3 years, you can reduce the checkups to once a year. Document Released: 01/20/2009 Document Revised: 06/18/2011 Document Reviewed: 01/20/2009 [facility] Patient Information 2013 [place], [place]. PATIENT INSTRUCTIONS POST-ANESTHESIA  IMMEDIATELY FOLLOWING SURGERY:  Do not drive or operate machinery for the first twenty four hours after surgery.  Do not make any important decisions for twenty four hours after surgery or while taking narcotic pain medications or sedatives.  If you develop intractable nausea and vomiting or a severe headache please notify your doctor immediately.  FOLLOW-UP:  Please make an appointment with your surgeon as instructed. You do not need to follow up with anesthesia unless specifically instructed to do so.  WOUND CARE INSTRUCTIONS (if applicable):  Keep a dry clean dressing on the anesthesia/puncture wound site if there is drainage.  Once the wound has quit draining you may leave it open to air.  Generally you should leave the bandage intact for twenty four hours unless there is drainage.  If the epidural site drains for more than 36-48 hours please call the anesthesia department.  QUESTIONS?:  Please feel free to call your physician or the hospital operator if you have any questions, and they will be happy to assist you.

## 2012-05-07 NOTE — OR Nursing (Signed)
Dr. Jerre Simon notified  To see if this patient will need mitomycine -C. Per Dr.Javiad he won't know until after he looks into the pt. then will make this decision. Kathlene November in pharmacy notified of possibility of this need.

## 2012-05-08 ENCOUNTER — Ambulatory Visit (HOSPITAL_COMMUNITY): Payer: Medicare Other | Admitting: Anesthesiology

## 2012-05-08 ENCOUNTER — Ambulatory Visit (HOSPITAL_COMMUNITY)
Admission: RE | Admit: 2012-05-08 | Discharge: 2012-05-08 | Disposition: A | Discharge status: Home / Self Care | Payer: Medicare Other | Source: Ambulatory Visit | Attending: Urology | Admitting: Urology

## 2012-05-08 ENCOUNTER — Encounter (HOSPITAL_COMMUNITY)
Admission: RE | Disposition: A | Discharge status: Home / Self Care | Payer: Self-pay | Source: Ambulatory Visit | Attending: Urology

## 2012-05-08 ENCOUNTER — Encounter (HOSPITAL_COMMUNITY): Payer: Self-pay | Admitting: *Deleted

## 2012-05-08 ENCOUNTER — Encounter (HOSPITAL_COMMUNITY): Payer: Self-pay | Admitting: Anesthesiology

## 2012-05-08 DIAGNOSIS — N320 Bladder-neck obstruction: Secondary | ICD-10-CM | POA: Insufficient documentation

## 2012-05-08 DIAGNOSIS — Z01812 Encounter for preprocedural laboratory examination: Secondary | ICD-10-CM | POA: Insufficient documentation

## 2012-05-08 DIAGNOSIS — N138 Other obstructive and reflux uropathy: Secondary | ICD-10-CM | POA: Insufficient documentation

## 2012-05-08 DIAGNOSIS — Z01810 Encounter for preprocedural cardiovascular examination: Secondary | ICD-10-CM | POA: Insufficient documentation

## 2012-05-08 DIAGNOSIS — E119 Type 2 diabetes mellitus without complications: Secondary | ICD-10-CM | POA: Insufficient documentation

## 2012-05-08 DIAGNOSIS — N401 Enlarged prostate with lower urinary tract symptoms: Secondary | ICD-10-CM | POA: Insufficient documentation

## 2012-05-08 DIAGNOSIS — N3289 Other specified disorders of bladder: Secondary | ICD-10-CM | POA: Insufficient documentation

## 2012-05-08 HISTORY — PX: CYSTOSCOPY: SHX5120

## 2012-05-08 LAB — GLUCOSE, CAPILLARY: Glucose-Capillary: 204 mg/dL — ABNORMAL HIGH (ref 70–99)

## 2012-05-08 SURGERY — CYSTOSCOPY
Anesthesia: General | Site: Bladder | Wound class: Clean Contaminated

## 2012-05-08 MED ORDER — FENTANYL CITRATE 0.05 MG/ML IJ SOLN
INTRAMUSCULAR | Status: AC
  Filled 2012-05-08: qty 2

## 2012-05-08 MED ORDER — MIDAZOLAM HCL 5 MG/5ML IJ SOLN
INTRAMUSCULAR | Status: DC | PRN
  Administered 2012-05-08: 2 mg via INTRAVENOUS

## 2012-05-08 MED ORDER — ONDANSETRON HCL 4 MG/2ML IJ SOLN: 4.0000 mg | Freq: Once | INTRAMUSCULAR | Status: DC | PRN

## 2012-05-08 MED ORDER — ONDANSETRON HCL 4 MG/2ML IJ SOLN
INTRAMUSCULAR | Status: AC
  Filled 2012-05-08: qty 2

## 2012-05-08 MED ORDER — LIDOCAINE HCL (PF) 1 % IJ SOLN
INTRAMUSCULAR | Status: AC
  Filled 2012-05-08: qty 5

## 2012-05-08 MED ORDER — MIDAZOLAM HCL 2 MG/2ML IJ SOLN
INTRAMUSCULAR | Status: AC
  Filled 2012-05-08: qty 2

## 2012-05-08 MED ORDER — STERILE WATER FOR IRRIGATION IR SOLN
Status: DC | PRN
  Administered 2012-05-08: 1000 mL

## 2012-05-08 MED ORDER — FENTANYL CITRATE 0.05 MG/ML IJ SOLN
25.0000 ug | INTRAMUSCULAR | Status: DC | PRN
  Administered 2012-05-08 (×2): 25 ug via INTRAVENOUS

## 2012-05-08 MED ORDER — FENTANYL CITRATE 0.05 MG/ML IJ SOLN
INTRAMUSCULAR | Status: DC | PRN
  Administered 2012-05-08: 25 ug via INTRAVENOUS
  Administered 2012-05-08: 50 ug via INTRAVENOUS
  Administered 2012-05-08: 75 ug via INTRAVENOUS

## 2012-05-08 MED ORDER — GLYCINE 1.5 % IR SOLN
Status: DC | PRN
  Administered 2012-05-08 (×7): 3000 mL

## 2012-05-08 MED ORDER — BUPIVACAINE IN DEXTROSE 0.75-8.25 % IT SOLN
INTRATHECAL | Status: AC
  Filled 2012-05-08: qty 2

## 2012-05-08 MED ORDER — ONDANSETRON HCL 4 MG/2ML IJ SOLN
4.0000 mg | Freq: Once | INTRAMUSCULAR | Status: AC
  Administered 2012-05-08: 4 mg via INTRAVENOUS

## 2012-05-08 MED ORDER — FENTANYL CITRATE 0.05 MG/ML IJ SOLN
25.0000 ug | INTRAMUSCULAR | Status: DC | PRN
  Administered 2012-05-08: 50 ug via INTRAVENOUS

## 2012-05-08 MED ORDER — MIDAZOLAM HCL 2 MG/2ML IJ SOLN
1.0000 mg | INTRAMUSCULAR | Status: DC | PRN
  Administered 2012-05-08: 2 mg via INTRAVENOUS

## 2012-05-08 MED ORDER — PROPOFOL 10 MG/ML IV BOLUS
INTRAVENOUS | Status: DC | PRN
  Administered 2012-05-08: 160 mg via INTRAVENOUS

## 2012-05-08 MED ORDER — LACTATED RINGERS IV SOLN
INTRAVENOUS | Status: DC
  Administered 2012-05-08: 10:00:00 via INTRAVENOUS

## 2012-05-08 SURGICAL SUPPLY — 35 items
BAG DECANTER FOR FLEXI CONT (MISCELLANEOUS) ×3 IMPLANT
BAG DRAIN URO TABLE W/ADPT NS (DRAPE) ×3 IMPLANT
BAG URINE DRAINAGE (UROLOGICAL SUPPLIES) ×3 IMPLANT
CABLE HI FREQUENCY MONOPOLAR (ELECTROSURGICAL) ×3 IMPLANT
CATH FOLEY 2WAY SLVR  5CC 18FR (CATHETERS) ×1
CATH FOLEY 2WAY SLVR  5CC 20FR (CATHETERS)
CATH FOLEY 2WAY SLVR 5CC 18FR (CATHETERS) ×2 IMPLANT
CATH FOLEY 2WAY SLVR 5CC 20FR (CATHETERS) IMPLANT
CLOTH BEACON ORANGE TIMEOUT ST (SAFETY) ×3 IMPLANT
CONNECTOR 5 IN 1 STRAIGHT STRL (MISCELLANEOUS) ×3 IMPLANT
ELECT CUT LOOP C-MAX 27FR .012 (CUTTING LOOP) ×3
ELECTRODE CUT LP CMX 27FR .012 (CUTTING LOOP) ×2 IMPLANT
FORMALIN 10 PREFIL 120ML (MISCELLANEOUS) IMPLANT
GLOVE BIO SURGEON STRL SZ7 (GLOVE) ×3 IMPLANT
GLOVE BIOGEL PI IND STRL 7.0 (GLOVE) ×2 IMPLANT
GLOVE BIOGEL PI IND STRL 7.5 (GLOVE) ×2 IMPLANT
GLOVE BIOGEL PI INDICATOR 7.0 (GLOVE) ×1
GLOVE BIOGEL PI INDICATOR 7.5 (GLOVE) ×1
GLOVE ECLIPSE 6.5 STRL STRAW (GLOVE) ×3 IMPLANT
GLOVE ECLIPSE 7.0 STRL STRAW (GLOVE) ×3 IMPLANT
GLOVE SS BIOGEL STRL SZ 6.5 (GLOVE) ×2 IMPLANT
GLOVE SUPERSENSE BIOGEL SZ 6.5 (GLOVE) ×1
GLYCINE 1.5% IRRIG UROMATIC (IV SOLUTION) ×21 IMPLANT
GOWN STRL REIN XL XLG (GOWN DISPOSABLE) ×9 IMPLANT
IV NS IRRIG 3000ML ARTHROMATIC (IV SOLUTION) ×3 IMPLANT
KIT ROOM TURNOVER AP CYSTO (KITS) ×3 IMPLANT
MANIFOLD NEPTUNE II (INSTRUMENTS) ×3 IMPLANT
PACK CYSTO (CUSTOM PROCEDURE TRAY) ×3 IMPLANT
PAD ARMBOARD 7.5X6 YLW CONV (MISCELLANEOUS) ×3 IMPLANT
PAD TELFA 3X4 1S STER (GAUZE/BANDAGES/DRESSINGS) IMPLANT
SET IRRIGATING DISP (SET/KITS/TRAYS/PACK) ×3 IMPLANT
SYR 30ML LL (SYRINGE) ×3 IMPLANT
TOWEL OR 17X26 4PK STRL BLUE (TOWEL DISPOSABLE) ×3 IMPLANT
WATER STERILE IRR 1000ML POUR (IV SOLUTION) ×3 IMPLANT
YANKAUER SUCT BULB TIP 10FT TU (MISCELLANEOUS) ×3 IMPLANT

## 2012-05-08 NOTE — Anesthesia Preprocedure Evaluation (Addendum)
Anesthesia Evaluation  Patient identified by MRN, date of birth, ID band Patient awake    Reviewed: Allergy & Precautions, H&P , NPO status , Patient's Chart, lab work & pertinent test results  Airway Mallampati: I TM Distance: >3 FB     Dental  (+) Edentulous Upper and Edentulous Lower   Pulmonary neg pulmonary ROS, former smoker,  breath sounds clear to auscultation        Cardiovascular negative cardio ROS  Rhythm:Regular Rate:Normal     Neuro/Psych    GI/Hepatic negative GI ROS,   Endo/Other  diabetes, Type 2  Renal/GU Renal disease     Musculoskeletal   Abdominal   Peds  Hematology   Anesthesia Other Findings   Reproductive/Obstetrics                           Anesthesia Physical Anesthesia Plan  ASA: II  Anesthesia Plan: General   Post-op Pain Management:    Induction: Intravenous  Airway Management Planned: LMA  Additional Equipment:   Intra-op Plan:   Post-operative Plan: Extubation in OR  Informed Consent: I have reviewed the patients History and Physical, chart, labs and discussed the procedure including the risks, benefits and alternatives for the proposed anesthesia with the patient or authorized representative who has indicated his/her understanding and acceptance.     Plan Discussed with:   Anesthesia Plan Comments:        Anesthesia Quick Evaluation

## 2012-05-08 NOTE — Anesthesia Postprocedure Evaluation (Signed)
  Anesthesia Post-op Note  Patient: Thomas Garcia  Procedure(s) Performed: Procedure(s) (LRB) with comments: TRANSURETHRAL RESECTION OF BLADDER TUMOR (TURBT) (N/A)  Patient Location: PACU  Anesthesia Type:General  Level of Consciousness: awake, alert  and patient cooperative  Airway and Oxygen Therapy: Patient Spontanous Breathing and Patient connected to face mask oxygen  Post-op Pain: none  Post-op Assessment: Post-op Vital signs reviewed, Patient's Cardiovascular Status Stable, Respiratory Function Stable, Patent Airway, No signs of Nausea or vomiting and Pain level controlled  Post-op Vital Signs: Reviewed and stable  Complications: No apparent anesthesia complications

## 2012-05-08 NOTE — Anesthesia Procedure Notes (Signed)
Procedure Name: LMA Insertion Date/Time: 05/08/2012 10:25 AM Performed by: Despina Hidden Pre-anesthesia Checklist: Emergency Drugs available, Patient identified, Suction available and Patient being monitored Patient Re-evaluated:Patient Re-evaluated prior to inductionOxygen Delivery Method: Circle system utilized Preoxygenation: Pre-oxygenation with 100% oxygen Intubation Type: IV induction Ventilation: Mask ventilation without difficulty LMA Size: 4.0 Tube type: Oral Number of attempts: 1 Placement Confirmation: breath sounds checked- equal and bilateral and positive ETCO2 Tube secured with: Tape Dental Injury: Teeth and Oropharynx as per pre-operative assessment
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
## 2012-05-08 NOTE — Progress Notes (Signed)
No change in H&P on reexamination. 

## 2012-05-08 NOTE — Progress Notes (Signed)
Standard drainage bag changed to leg bag. Stat lock applied to R thigh. Pt instructed on foley care . Verbalized understanding and showed understanding through teach back method.

## 2012-05-08 NOTE — Op Note (Signed)
NAME:  Thomas Garcia, Thomas Garcia               ACCOUNT NO.:  [id number]  MEDICAL RECORD NO.:  [id number]  LOCATION:  [facility]                          FACILITY:  [facility]  PHYSICIAN:  Ky Barban, M.D.DATE OF BIRTH:  10/08/1940  DATE OF PROCEDURE: DATE OF DISCHARGE:                              OPERATIVE REPORT   PREOPERATIVE DIAGNOSIS:  Large blood clot organized, rule out bladder tumor.  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  No bladder tumor.  PROCEDURE:  Cystoscopy and evacuation of blood clots.  ANESTHESIA:  Spinal.  DESCRIPTION OF PROCEDURE:  The patient was under spinal anesthesia in lithotomy position.  After usual prep and drape, a #28 Iglesias resectoscope was introduced into the bladder.  He has a large organized blood clot sitting on the trigone.  I could not see behind it.  With the cold knife, I was able to cut the blood clot out, looked behind it and there was no tumor.  There was just inflammation behind and he has a very large prostate with very large median lobe which is bleeding.  Some of these bleeders were coagulated.  After making sure that he does have any bladder tumor, I cut the blood clot out and evacuated with the help of Ellik evacuator.  At the end, I looked into the bladder.  All the clot is out.  I decided to leave a Foley catheter in because he is still oozing blood from the prostatic urethra and #18 Foley catheter left in for drainage.  The patient left the operating room in satisfactory condition.     Ky Barban, M.D.     MIJ/MEDQ  D:  05/08/2012  T:  05/08/2012  Job:  [id number]

## 2012-05-08 NOTE — Anesthesia Postprocedure Evaluation (Signed)
  Anesthesia Post-op Note  Patient: Thomas Garcia  Procedure(s) Performed: Procedure(s) (LRB) with comments: TRANSURETHRAL RESECTION OF BLADDER TUMOR (TURBT) (N/A)  Patient Location: PACU  Anesthesia Type:General  Level of Consciousness: awake, alert , oriented and patient cooperative  Airway and Oxygen Therapy: Patient Spontanous Breathing  Post-op Pain: none  Post-op Assessment: Post-op Vital signs reviewed, Patient's Cardiovascular Status Stable, Respiratory Function Stable, Patent Airway, No signs of Nausea or vomiting and Pain level controlled  Post-op Vital Signs: Reviewed and stable  Complications: No apparent anesthesia complications

## 2012-05-09 ENCOUNTER — Encounter (HOSPITAL_COMMUNITY): Payer: Self-pay | Admitting: Urology

## 2012-05-09 ENCOUNTER — Encounter (HOSPITAL_COMMUNITY): Payer: Self-pay | Admitting: Anesthesiology

## 2012-05-09 MED ORDER — PROPOFOL 10 MG/ML IV EMUL
INTRAVENOUS | Status: AC
  Filled 2012-05-09: qty 20

## 2012-05-09 NOTE — Transfer of Care (Signed)
Immediate Anesthesia Transfer of Care Note  Patient: Thomas Garcia  Procedure(s) Performed: Procedure(s) (LRB) with comments: TRANSURETHRAL RESECTION OF BLADDER TUMOR (TURBT) (N/A)  Patient Location: PACU  Anesthesia Type:Spinal  Level of Consciousness: awake, alert , oriented and patient cooperative  Airway & Oxygen Therapy: Patient Spontanous Breathing  Post-op Assessment: Report given to PACU RN, Post -op Vital signs reviewed and stable, Patient moving all extremities and Patient moving all extremities X 4  Post vital signs: Reviewed and stable  Complications: No apparent anesthesia complications

## 2012-05-12 ENCOUNTER — Encounter (HOSPITAL_COMMUNITY): Payer: Self-pay | Admitting: Pharmacy Technician

## 2012-05-13 ENCOUNTER — Encounter (HOSPITAL_COMMUNITY): Payer: Self-pay

## 2012-05-13 ENCOUNTER — Encounter (HOSPITAL_COMMUNITY)
Admission: RE | Admit: 2012-05-13 | Discharge: 2012-05-13 | Disposition: A | Discharge status: Home / Self Care | Payer: Medicare Other | Source: Ambulatory Visit | Admitting: Urology

## 2012-05-13 NOTE — Patient Instructions (Addendum)
PRIYANSH PRY  05/13/2012   Your procedure is scheduled on:  Thursday, 05/14/12  Report to [facility] at 1130 AM.  Call this number if you have problems the morning of surgery: [phone]   Remember:   Do not eat food or drink liquids after midnight.   Take these medicines the morning of surgery with A SIP OF WATER: none   Do not wear jewelry, make-up or nail polish.  Do not wear lotions, powders, or perfumes. You may wear deodorant.  Do not shave 48 hours prior to surgery. Men may shave face and neck.  Do not bring valuables to the hospital.  Contacts, dentures or bridgework may not be worn into surgery.  Leave suitcase in the car. After surgery it may be brought to your room.  For patients admitted to the hospital, checkout time is 11:00 AM the day of  discharge.   Patients discharged the day of surgery will not be allowed to drive  home.  Name and phone number of your driver: family  Special Instructions: Shower using CHG 2 nights before surgery and the night before surgery.  If you shower the day of surgery use CHG.  Use special wash - you have one bottle of CHG for all showers.  You should use approximately 1/3 of the bottle for each shower.   Please read over the following fact sheets that you were given: Pain Booklet, MRSA Information, Surgical Site Infection Prevention, Anesthesia Post-op Instructions and Care and Recovery After Surgery   Transurethral Resection of the Prostate Care After Refer to this sheet in the next few weeks. These discharge instructions provide you with general information on caring for yourself after you leave the hospital. Your caregiver may also give you specific instructions. Your treatment has been planned according to the most current medical practices available, but unavoidable complications sometimes occur. If you have any problems or questions after discharge, please call your caregiver. HOME CARE INSTRUCTIONS  Medications  You will receive  medicine for pain management. As your level of discomfort decreases, adjustments in your pain medicines may be made.  Since it is important that you do deep breathing and coughing exercises and increase your activity, it may be helpful to take pain medicines prior to these activities. Take all medicines as directed.  You may be given a medicine (antibiotic) to kill germs following surgery. Finish all medicines. Let your caregiver know if you have any side effects or problems from the medicine. Hygiene  You can take a shower after surgery.  You should not take a bath while you still have the urethral catheter. Activity  You will be encouraged to get out of bed as much as possible and increase your activity level as tolerated.  Spend the first week in and around your home. For 10 days, avoid the following:  Straining.  Running.  Strenuous work.  Walks longer than a few blocks.  Riding for extended periods.  Sexual relations.  Do not lift heavy objects (more than 5 pounds/2.25 kilograms) for at least 1 month. When lifting, use your arms instead of your abdominal muscles.  You will be encouraged to walk as tolerated. Do not exert yourself. Increase your activity level slowly. Remember that it is important to keep moving after an operation of any type. This cuts down on the possibility of developing blood clots.  Your caregiver will tell you when you can resume driving and light housework. Discuss this at your first office visit after discharge.  Diet  No special diet is ordered after a TURP. However, if you are on a special diet for another medical problem, it should be continued.  Normal fluid intake is usually recommended.  Avoid alcohol and caffeinated drinks for 2 weeks. They irritate the bladder. Decaffeinated drinks are okay.  Avoid spicy foods. Bladder Function  For the first 10 days, empty the bladder whenever you feel a definite desire. Do not try to hold the urine for  long periods of time.  Urinating once or twice a night even after you are healed is not uncommon.  You may see some recurrence of blood in the urine after discharge from the hospital. If this occurs, force fluids again as you did in the hospital and reduce your activity. Bowel Function  You may experience some constipation after surgery. This can be minimized by increasing fluids and fiber in your diet. Drink enough water and fluids to keep your urine clear or pale yellow.  A stool softener may be prescribed for use at home. Do not strain to move your bowels.  If you are requiring increased pain medicine, it is important that you take stool softeners to prevent constipation. This will help to promote proper healing by reducing the need to strain to move your bowels. Sexual Activity  Semen movement in the opposite direction and into the bladder (retrograde ejaculation) may occur. Since the semen passes into the bladder, cloudy urine can occur the first time you urinate after intercourse. Or, you may not have an ejaculation during erection. Ask your caregiver when you can resume sexual activity. Retrograde ejaculation and reduced semen discharge should not reduce one's pleasure of intercourse. Postoperative Visit  Arrange the date and time of your after surgery visit with your caregiver. Return to Work  After your recovery is complete, you will be able to return to work and resume all activities. Your caregiver will inform you when you can return to work. Foley Catheter Care A soft, flexible tube (Foley catheter) has been placed in your bladder to drain urine and fluid. Follow these instructions: Taking Care of the Catheter  Keep the area where the catheter leaves your body clean.  Attach the catheter to the leg so there is no tension on the catheter.  Keep the drainage bag below the level of the bladder, but keep it OFF the floor.  Do not take long soaking baths. Your caregiver will  give instructions about showering.  Wash your hands before touching ANYTHING related to the catheter or bag.  Using mild soap and warm water on a washcloth:  Clean the area closest to the catheter insertion site using a circular motion around the catheter.  Clean the catheter itself by wiping AWAY from the insertion site for several inches down the tube.  NEVER wipe upward as this could sweep bacteria up into the urethra (tube in your body that normally drains the bladder) and cause infection.  Place a small amount of sterile lubricant at the tip of the penis where the catheter is entering. Taking Care of the Drainage Bags  Two drainage bags will be taken home: a large overnight drainage bag, and a smaller leg bag which fits underneath clothing.  It is okay to wear the overnight bag at any time, but NEVER wear the smaller leg bag at night.  Keep the drainage bag well below the level of your bladder. This prevents backflow of urine into the bladder and allows the urine to drain freely.  Anchor  the tubing to your leg to prevent pulling or tension on the catheter. Use tape or a leg strap provided by the hospital.  Empty the drainage bag when it is 1/2 to 3/4 full. Wash your hands before and after touching the bag.  Periodically check the tubing for kinks to make sure there is no pressure on the tubing which could restrict the flow of urine. Changing the Drainage Bags  Cleanse both ends of the clean bag with alcohol before changing.  Pinch off the rubber catheter to avoid urine spillage during the disconnection.  Disconnect the dirty bag and connect the clean one.  Empty the dirty bag carefully to avoid a urine spill.  Attach the new bag to the leg with tape or a leg strap. Cleaning the Drainage Bags  Whenever a drainage bag is disconnected, it must be cleaned quickly so it is ready for the next use.  Wash the bag in warm, soapy water.  Rinse the bag thoroughly with warm  water.  Soak the bag for 30 minutes in a solution of white vinegar and water (1 cup vinegar to 1 quart warm water).  Rinse with warm water. SEEK MEDICAL CARE IF:   You have chills or night sweats.  You are leaking around your catheter or have problems with your catheter. It is not uncommon to have sporadic leakage around your catheter as a result of bladder spasms. If the leakage stops, there is not much need for concern. If you are uncertain, call your caregiver.  You develop side effects that you think are coming from your medicines. SEEK IMMEDIATE MEDICAL CARE IF:   You are suddenly unable to urinate. Check to see if there are any kinks in the drainage tubing that may cause this. If you cannot find any kinks, call your caregiver immediately. This is an emergency.  You develop shortness of breath or chest pains.  Bleeding persists or clots develop in your urine.  You have a fever.  You develop pain in your back or over your lower belly (abdomen).  You develop pain or swelling in your legs.  Any problems you are having get worse rather than better. MAKE SURE YOU:   Understand these instructions.  Will watch your condition.  Will get help right away if you are not doing well or get worse. Document Released: 03/26/2005 Document Revised: 06/18/2011 Document Reviewed: 05/04/2011 [facility] Patient Information 2013 [place], [place]. Transurethral Resection of the Prostate Transurethral Resection of the Prostate (TURP) is often treatment for non-cancerous (benign) prostatic hyperplasia (BPH) or prostate cancer. BPH commonly begins in middle aged men and can cause symptoms at any age thereafter. The complications that stem from these problems, such as recurrent infection and bladder control and emptying problems, are often helped by this procedure. Both conditions usually cause the prostate to increase in size. TURP is a major surgery that removes part of the prostate gland. The goal is  to remove enough prostate to allow for unobstructed flow of urine. TREATMENT This surgery (TURP procedure) is done using an instrument like a narrow telescope to look into your bladder. This is then used to remove enlarged pieces of your prostate, one piece at a time. This removes the blockage and makes it easier for you to urinate. This is called a transurethral resection of the prostate. A lesser procedure is sometimes done. In this procedure, small cuts are made in the prostate. This lessens the prostates pressure on the urethra. This is called a transurethral incision (  cut by the surgeon) of the prostate (TUIP). You will probably be comfortable soon after the operation, but it will take 10 to 12 weeks, or longer, for your prostate to heal after you leave the hospital.  LET YOUR CAREGIVER KNOW ABOUT:  Allergies.  Medications taken including herbs, eye drops, over the counter medications, and creams.  Use of steroids (by mouth or creams).  Previous problems with anesthetics or Novocaine.  History of blood clots (thrombophlebitis).  History of bleeding or blood problems.  Previous surgery.  Previous prostate infections.  Other health problems. RISKS AND COMPLICATIONS  Rare injury to the bowel (intestine).  Rare injury to the bladder or adjacent blood vessels.  Intestinal/bowel obstruction.  Scarring called "stricture" that cause later problems with the flow of urine.  Bleeding and the need for blood transfusion (more common in those with prostate cancer and those who have previously received radiation therapy).  Inability to control your urine (incontinence). This is more common in those with prostate cancer and those who have received radiation therapy.  Injury to one of the ureters (tubes that drain the kidneys into the bladder) and/or urethra (the tube that drains the bladder).  Injury to the capsule that holds the prostate. This can lead to leakage of fluid and urine into  the belly (abdomen).  The operation can possibly lead to impotence. This is the inability to get an erection. Treatments are available for these types of problems.  Rare over absorption of the fluids used during the operation. This can then cause low blood levels of sodium, brain swelling, strain on the heart, and fluid accumulation in the lungs. This is more common in long procedures.  Infection.  Blood clots in the legs.  As with any major surgery, there is always the rare chance of a complicating stroke, heart attack, or other complications that will be discussed with you by the surgeon and anesthesiologist. BEFORE THE PROCEDURE   You may be asked to temporarily adjust your diet. If so, your caregiver will give you specific recommendations.  If you are on blood thinners, stop taking them before the operation, or as your caregiver advises.  You should have nothing to eat or drink after midnight prior to your surgery or as suggested by your caregiver. You may have a sip of water to take medications not stopped for the procedure PROCEDURE  This operation is performed after you have been given a medication to help you sleep (anesthetic), or with a spinal block. The spinal block keeps you awake but numb from the waist down.  No cut or incision is needed. During the operation, your surgeon passes a viewing and cutting instrument (resectoscope) through the penis into the prostate gland. The instrument contains an electric cutting edge. From inside the prostate, this cutter is used to remove part of the prostate.  HOME CARE INSTRUCTIONS  For your own protection, observe the following precautions for 10 days after your operation.  You may go home with a catheter. Take care of it as directed. You will receive instruction on catheter care.  After catheter removal, empty the bladder whenever you feel a definite desire. Do not try to hold the urine for long periods of time.  For 10 days, avoid all  lifting, straining, running, strenuous work, walks longer than a couple blocks, riding in a car for extended periods, and sexual relations.  Take 2 tablespoons of heavy mineral oil or Metamucil night and morning for 3 or 4 days. After that,  gradually reduce the dose to one or two teaspoons twice daily. Stop it after the stools have been normal for a week. If you become constipated, do not strain to move your bowels. You may use an enema. Notify your caregiver about problems.  Even after complete healing, you may continue to urinate once or twice during the night.  In addition to your usual medications, you may be given an antibiotic to take for 10-14 days. Notify your caregiver if you have any side effects or problems with the medication.  Avoid alcohol and caffeinated drinks for 2 weeks, as they are irritating to the bladder. Decaffeinated drinks are fine.  Eat a regular diet, avoiding spicy foods for 2 weeks.  You may continue non-strenuous activities. It is always important to keep active after an operation. This lessens the chance of developing blood clots.  You may see some recurrence of blood in the urine after discharge from the hospital. Even a small amount of blood colors the urine very red. If this occurs, force fluids again as you did in the hospital. This is generally not a concern. SEEK MEDICAL CARE IF:   You have chills or night sweats.  You are leaking around your catheter or have problems with your catheter.  You develop side effects that you think are coming from your medications. SEEK IMMEDIATE MEDICAL CARE IF:   You are suddenly unable to urinate. This is an emergency.  You develop shortness of breath or chest pains.  Bleeding persists or clots develop.  You have a fever.  You develop pain in your back or over your lower belly (abdomen).  You develop pain or swelling in your legs.  You develop swelling in your abdomen or have a sudden weight gain.  The  problems get worse rather than better. Document Released: 03/26/2005 Document Revised: 06/18/2011 Document Reviewed: 08/27/2011 [facility] Patient Information 2013 [place], [place].

## 2012-05-14 ENCOUNTER — Encounter (HOSPITAL_COMMUNITY): Payer: Self-pay | Admitting: Anesthesiology

## 2012-05-14 ENCOUNTER — Observation Stay (HOSPITAL_COMMUNITY)
Admission: RE | Admit: 2012-05-14 | Discharge: 2012-05-15 | Disposition: A | Discharge status: Home / Self Care | Payer: Medicare Other | Source: Ambulatory Visit | Attending: Urology | Admitting: Urology

## 2012-05-14 ENCOUNTER — Ambulatory Visit (HOSPITAL_COMMUNITY): Payer: Medicare Other | Admitting: Anesthesiology

## 2012-05-14 ENCOUNTER — Encounter (HOSPITAL_COMMUNITY)
Admission: RE | Disposition: A | Discharge status: Home / Self Care | Payer: Self-pay | Source: Ambulatory Visit | Attending: Urology

## 2012-05-14 ENCOUNTER — Encounter (HOSPITAL_COMMUNITY): Payer: Self-pay | Admitting: *Deleted

## 2012-05-14 ENCOUNTER — Encounter (HOSPITAL_COMMUNITY): Payer: Self-pay

## 2012-05-14 DIAGNOSIS — N3000 Acute cystitis without hematuria: Secondary | ICD-10-CM

## 2012-05-14 DIAGNOSIS — Z01812 Encounter for preprocedural laboratory examination: Secondary | ICD-10-CM | POA: Insufficient documentation

## 2012-05-14 DIAGNOSIS — Z23 Encounter for immunization: Secondary | ICD-10-CM | POA: Insufficient documentation

## 2012-05-14 DIAGNOSIS — D239 Other benign neoplasm of skin, unspecified: Secondary | ICD-10-CM

## 2012-05-14 DIAGNOSIS — E663 Overweight: Secondary | ICD-10-CM

## 2012-05-14 DIAGNOSIS — E119 Type 2 diabetes mellitus without complications: Secondary | ICD-10-CM

## 2012-05-14 DIAGNOSIS — F17200 Nicotine dependence, unspecified, uncomplicated: Secondary | ICD-10-CM

## 2012-05-14 DIAGNOSIS — R310 Gross hematuria: Secondary | ICD-10-CM | POA: Insufficient documentation

## 2012-05-14 DIAGNOSIS — R05 Cough: Secondary | ICD-10-CM

## 2012-05-14 DIAGNOSIS — R5381 Other malaise: Secondary | ICD-10-CM

## 2012-05-14 DIAGNOSIS — R9431 Abnormal electrocardiogram [ECG] [EKG]: Secondary | ICD-10-CM

## 2012-05-14 DIAGNOSIS — R059 Cough, unspecified: Secondary | ICD-10-CM

## 2012-05-14 DIAGNOSIS — N401 Enlarged prostate with lower urinary tract symptoms: Principal | ICD-10-CM | POA: Insufficient documentation

## 2012-05-14 DIAGNOSIS — N138 Other obstructive and reflux uropathy: Principal | ICD-10-CM | POA: Insufficient documentation

## 2012-05-14 HISTORY — PX: TRANSURETHRAL RESECTION OF PROSTATE: SHX73

## 2012-05-14 LAB — POCT I-STAT 4, (NA,K, GLUC, HGB,HCT): Glucose, Bld: 214 mg/dL — ABNORMAL HIGH (ref 70–99)

## 2012-05-14 SURGERY — TURP (TRANSURETHRAL RESECTION OF PROSTATE)
Anesthesia: Spinal | Site: Prostate | Wound class: Clean Contaminated

## 2012-05-14 MED ORDER — FENTANYL CITRATE 0.05 MG/ML IJ SOLN
INTRAMUSCULAR | Status: DC | PRN
  Administered 2012-05-14: 25 ug via INTRAVENOUS

## 2012-05-14 MED ORDER — INFLUENZA VIRUS VACC SPLIT PF IM SUSP
0.5000 mL | INTRAMUSCULAR | Status: AC
  Administered 2012-05-15: 0.5 mL via INTRAMUSCULAR
  Filled 2012-05-14: qty 0.5

## 2012-05-14 MED ORDER — STERILE WATER FOR IRRIGATION IR SOLN
Status: DC | PRN
  Administered 2012-05-14: 1000 mL

## 2012-05-14 MED ORDER — MIDAZOLAM HCL 2 MG/2ML IJ SOLN
INTRAMUSCULAR | Status: AC
  Filled 2012-05-14: qty 2

## 2012-05-14 MED ORDER — MIDAZOLAM HCL 5 MG/5ML IJ SOLN
INTRAMUSCULAR | Status: DC | PRN
  Administered 2012-05-14 (×3): 2 mg via INTRAVENOUS

## 2012-05-14 MED ORDER — PROPOFOL 10 MG/ML IV BOLUS
INTRAVENOUS | Status: DC | PRN
  Administered 2012-05-14: 10 mg via INTRAVENOUS

## 2012-05-14 MED ORDER — CIPROFLOXACIN IN D5W 200 MG/100ML IV SOLN
200.0000 mg | Freq: Once | INTRAVENOUS | Status: AC
  Administered 2012-05-14: 200 mg via INTRAVENOUS

## 2012-05-14 MED ORDER — PROPOFOL INFUSION 10 MG/ML OPTIME
INTRAVENOUS | Status: DC | PRN
  Administered 2012-05-14: 25 ug/kg/min via INTRAVENOUS

## 2012-05-14 MED ORDER — FENTANYL CITRATE 0.05 MG/ML IJ SOLN
25.0000 ug | INTRAMUSCULAR | Status: DC | PRN
  Administered 2012-05-14: 25 ug via INTRAVENOUS

## 2012-05-14 MED ORDER — SODIUM CHLORIDE 0.45 % IV SOLN
INTRAVENOUS | Status: DC
  Administered 2012-05-14: 16:00:00 via INTRAVENOUS

## 2012-05-14 MED ORDER — SODIUM CHLORIDE 0.9 % IR SOLN
Status: DC | PRN
  Administered 2012-05-14: 3000 mL via INTRAVESICAL

## 2012-05-14 MED ORDER — LIDOCAINE HCL (PF) 1 % IJ SOLN
INTRAMUSCULAR | Status: AC
  Filled 2012-05-14: qty 5

## 2012-05-14 MED ORDER — GLYCOPYRROLATE 0.2 MG/ML IJ SOLN
0.2000 mg | Freq: Once | INTRAMUSCULAR | Status: AC
  Administered 2012-05-14: 0.2 mg via INTRAVENOUS

## 2012-05-14 MED ORDER — MIDAZOLAM HCL 2 MG/2ML IJ SOLN
1.0000 mg | INTRAMUSCULAR | Status: DC | PRN
  Administered 2012-05-14: 2 mg via INTRAVENOUS

## 2012-05-14 MED ORDER — FENTANYL CITRATE 0.05 MG/ML IJ SOLN
INTRAMUSCULAR | Status: AC
  Filled 2012-05-14: qty 2

## 2012-05-14 MED ORDER — BUPIVACAINE IN DEXTROSE 0.75-8.25 % IT SOLN
INTRATHECAL | Status: AC
  Filled 2012-05-14: qty 2

## 2012-05-14 MED ORDER — GLYCOPYRROLATE 0.2 MG/ML IJ SOLN
INTRAMUSCULAR | Status: AC
  Filled 2012-05-14: qty 1

## 2012-05-14 MED ORDER — ONDANSETRON HCL 4 MG/2ML IJ SOLN: 4.0000 mg | Freq: Once | INTRAMUSCULAR | Status: DC | PRN

## 2012-05-14 MED ORDER — EPHEDRINE SULFATE 50 MG/ML IJ SOLN
INTRAMUSCULAR | Status: DC | PRN
  Administered 2012-05-14: 10 mg via INTRAVENOUS

## 2012-05-14 MED ORDER — CIPROFLOXACIN IN D5W 200 MG/100ML IV SOLN
INTRAVENOUS | Status: AC
  Filled 2012-05-14: qty 100

## 2012-05-14 MED ORDER — FENTANYL CITRATE 0.05 MG/ML IJ SOLN: 25.0000 ug | INTRAMUSCULAR | Status: DC | PRN

## 2012-05-14 MED ORDER — PROPOFOL 10 MG/ML IV EMUL
INTRAVENOUS | Status: AC
  Filled 2012-05-14: qty 20

## 2012-05-14 MED ORDER — HYDROMORPHONE HCL PF 1 MG/ML IJ SOLN
0.5000 mg | INTRAMUSCULAR | Status: DC | PRN
  Administered 2012-05-14: 0.5 mg via INTRAVENOUS
  Filled 2012-05-14: qty 1

## 2012-05-14 MED ORDER — GLYCINE 1.5 % IR SOLN
Status: DC | PRN
  Administered 2012-05-14 (×14): 3000 mL

## 2012-05-14 MED ORDER — PNEUMOCOCCAL VAC POLYVALENT 25 MCG/0.5ML IJ INJ
0.5000 mL | INJECTION | INTRAMUSCULAR | Status: AC
  Administered 2012-05-15: 0.5 mL via INTRAMUSCULAR
  Filled 2012-05-14: qty 0.5

## 2012-05-14 MED ORDER — LACTATED RINGERS IV SOLN
INTRAVENOUS | Status: DC
  Administered 2012-05-14: 1000 mL via INTRAVENOUS
  Administered 2012-05-14: 10:00:00 via INTRAVENOUS

## 2012-05-14 MED ORDER — EPHEDRINE SULFATE 50 MG/ML IJ SOLN
INTRAMUSCULAR | Status: AC
  Filled 2012-05-14: qty 1

## 2012-05-14 SURGICAL SUPPLY — 33 items
BAG DECANTER FOR FLEXI CONT (MISCELLANEOUS) ×6 IMPLANT
BAG DRAIN URO TABLE W/ADPT NS (DRAPE) ×2 IMPLANT
BAG URINE DRAIN TURP 4L (OSTOMY) ×2 IMPLANT
CABLE HI FREQUENCY MONOPOLAR (ELECTROSURGICAL) ×2 IMPLANT
CATH FOLEY 3WAY 30CC 22F (CATHETERS) ×2 IMPLANT
CLOTH BEACON ORANGE TIMEOUT ST (SAFETY) ×2 IMPLANT
CONNECTOR 5 IN 1 STRAIGHT STRL (MISCELLANEOUS) ×2 IMPLANT
DRAPE STERI URO 23X35 APER SZ5 (DRAPE) ×2 IMPLANT
DRAPE WARM FLUID 44X44 (DRAPE) ×2 IMPLANT
ELECT CUT LOOP C-MAX 27FR .012 (CUTTING LOOP) ×4
ELECT REM PT RETURN 9FT ADLT (ELECTROSURGICAL) ×2
ELECTRODE CUT LP CMX 27FR .012 (CUTTING LOOP) ×2 IMPLANT
ELECTRODE REM PT RTRN 9FT ADLT (ELECTROSURGICAL) ×1 IMPLANT
FORMALIN 10 PREFIL 480ML (MISCELLANEOUS) ×2 IMPLANT
GLOVE BIO SURGEON STRL SZ7 (GLOVE) ×2 IMPLANT
GLOVE BIOGEL PI IND STRL 7.0 (GLOVE) ×1 IMPLANT
GLOVE BIOGEL PI IND STRL 7.5 (GLOVE) ×2 IMPLANT
GLOVE BIOGEL PI INDICATOR 7.0 (GLOVE) ×1
GLOVE BIOGEL PI INDICATOR 7.5 (GLOVE) ×2
GLOVE ECLIPSE 7.0 STRL STRAW (GLOVE) ×2 IMPLANT
GLYCINE 1.5% IRRIG UROMATIC (IV SOLUTION) ×28 IMPLANT
GOWN STRL REIN XL XLG (GOWN DISPOSABLE) ×4 IMPLANT
IV NS IRRIG 3000ML ARTHROMATIC (IV SOLUTION) ×4 IMPLANT
KIT ROOM TURNOVER AP CYSTO (KITS) ×2 IMPLANT
MANIFOLD NEPTUNE II (INSTRUMENTS) ×2 IMPLANT
PACK CYSTO (CUSTOM PROCEDURE TRAY) ×2 IMPLANT
PAD ARMBOARD 7.5X6 YLW CONV (MISCELLANEOUS) ×2 IMPLANT
SET IRRIGATING DISP (SET/KITS/TRAYS/PACK) ×2 IMPLANT
SYR 30ML LL (SYRINGE) ×2 IMPLANT
TOWEL OR 17X26 4PK STRL BLUE (TOWEL DISPOSABLE) ×2 IMPLANT
WATER STERILE IRR 1000ML POUR (IV SOLUTION) ×2 IMPLANT
XPEEDA 550 SIDEFIRING FIBER (MISCELLANEOUS) IMPLANT
YANKAUER SUCT 12FT TUBE ARGYLE (SUCTIONS) ×2 IMPLANT

## 2012-05-14 NOTE — Progress Notes (Signed)
No change in H&P on reexamination. 

## 2012-05-14 NOTE — H&P (Signed)
NAME:  Thomas Garcia, Thomas Garcia               ACCOUNT NO.:  [id number]  MEDICAL RECORD NO.:  [id number]  LOCATION:                                FACILITY:  [facility]  PHYSICIAN:  Ky Barban, M.D.DATE OF BIRTH:  Jan 27, 1941  DATE OF ADMISSION:  05/14/2012 DATE OF DISCHARGE:  LH                             HISTORY & PHYSICAL   CHIEF COMPLAINT:  Gross hematuria and benign prostatic hypertrophy.  HISTORY:  This patient who is a 72 year old gentleman seen first in the office on January 21 with 106-month history of gross total painless hematuria, passing blood clots.  Had a CT scan done with contrast.  It showed questionable bladder tumor.  I looked into the bladder.  There was a large organized blood clot, and I could not see the whole bladder, so I took him to the operating room, and cystoscoped him under anesthesia.  There was no bladder tumor, removed that big organized blood clot and sent him home with Foley catheter because he was bleeding from prostatic urethra, which has hypertrophy bilateral lateral lobe with large median lobe.  He denies significant voiding difficulty, but he was bleeding and has significant bladder neck obstruction, and I have advised him to undergo TUR prostate for which he is coming in the morning to undergo TUR prostate, will stay overnight in the hospital. Next day, he will go home with the catheter.  No fever or chills.  The gross hematuria has subsided.  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  He was bleeding from spleen and the left kidney.  He has type 2 diabetes, diagnosed 64, but does not take any medicine, just watches with diet.  Never had any other surgery.  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  Does not drink any alcohol.  REVIEW OF SYSTEMS:  Unremarkable.  PHYSICAL EXAMINATION:  GENERAL:  Well-nourished,  well-developed male, not in acute distress, fully conscious, alert, oriented. VITAL SIGNS:  Blood pressure 130/80, temperature is normal. CENTRAL NERVOUS SYSTEM:  No gross neurological deficit. HEAD, NECK, EYES, ENT:  Negative. CHEST:  Symmetrical.  No breath sounds. HEART:  Regular sinus rhythm.  No murmur. ABDOMEN:  Soft, flat.  Liver, spleen, kidneys not palpable.  No CVA tenderness. GU:  External genitalia has a Foley catheter in place.  Testicles are normal. RECTAL:  Prostate 2+ smooth and firm.  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  TUR prostate.  Procedure limitation, and complications were discussed.  He understands and want me to proceed.     Ky Barban, M.D.     MIJ/MEDQ  D:  05/13/2012  T:  05/13/2012  Job:  [id number]

## 2012-05-14 NOTE — Anesthesia Postprocedure Evaluation (Signed)
  Anesthesia Post-op Note  Patient: Thomas Garcia  Procedure(s) Performed: Procedure(s) (LRB) with comments: TRANSURETHRAL RESECTION OF THE PROSTATE (TURP) (N/A)  Patient Location: PACU  Anesthesia Type:Spinal  Level of Consciousness: awake, alert  and oriented  Airway and Oxygen Therapy: Patient Spontanous Breathing and Patient connected to nasal cannula oxygen  Post-op Pain: none  Post-op Assessment: Post-op Vital signs reviewed, Patient's Cardiovascular Status Stable, Respiratory Function Stable, Patent Airway and No signs of Nausea or vomiting  Post-op Vital Signs: Reviewed and stable  Complications: No apparent anesthesia complications

## 2012-05-14 NOTE — Transfer of Care (Signed)
Immediate Anesthesia Transfer of Care Note  Patient: Thomas Garcia  Procedure(s) Performed: Procedure(s) (LRB) with comments: TRANSURETHRAL RESECTION OF THE PROSTATE (TURP) (N/A)  Patient Location: PACU  Anesthesia Type:Spinal  Level of Consciousness: awake, alert  and oriented  Airway & Oxygen Therapy: Patient Spontanous Breathing  Post-op Assessment: Report given to PACU RN  Post vital signs: Reviewed and stable  Complications: No apparent anesthesia complications

## 2012-05-14 NOTE — Anesthesia Preprocedure Evaluation (Signed)
Anesthesia Evaluation  Patient identified by MRN, date of birth, ID band Patient awake    Reviewed: Allergy & Precautions, H&P , NPO status , Patient's Chart, lab work & pertinent test results  Airway Mallampati: I TM Distance: >3 FB     Dental  (+) Edentulous Upper and Edentulous Lower   Pulmonary neg pulmonary ROS, COPDformer smoker (chronic productive cough),  breath sounds clear to auscultation        Cardiovascular negative cardio ROS  Rhythm:Regular Rate:Normal     Neuro/Psych    GI/Hepatic negative GI ROS,   Endo/Other  diabetes, Poorly Controlled, Type 2  Renal/GU Renal disease     Musculoskeletal   Abdominal   Peds  Hematology   Anesthesia Other Findings   Reproductive/Obstetrics                           Anesthesia Physical Anesthesia Plan  ASA: III  Anesthesia Plan: Spinal   Post-op Pain Management:    Induction: Intravenous  Airway Management Planned:   Additional Equipment:   Intra-op Plan:   Post-operative Plan:   Informed Consent: I have reviewed the patients History and Physical, chart, labs and discussed the procedure including the risks, benefits and alternatives for the proposed anesthesia with the patient or authorized representative who has indicated his/her understanding and acceptance.     Plan Discussed with:   Anesthesia Plan Comments:         Anesthesia Quick Evaluation

## 2012-05-14 NOTE — Op Note (Signed)
NAME:  Thomas Garcia, Thomas Garcia               ACCOUNT NO.:  [id number]  MEDICAL RECORD NO.:  [id number]  LOCATION:  [facility]                          FACILITY:  [facility]  PHYSICIAN:  Ky Barban, M.D.DATE OF BIRTH:  Aug 20, 1940  DATE OF PROCEDURE: DATE OF DISCHARGE:                              OPERATIVE REPORT   PREOPERATIVE DIAGNOSES:  Gross hematuria, benign prostatic hypertrophy.  POSTOPERATIVE DIAGNOSES:  Gross hematuria, benign prostatic hypertrophy.  PROCEDURE:  Transurethral resection of the prostate.  ANESTHESIA:  Spinal.  PROCEDURE:  The patient under spinal anesthesia in lithotomy position. After usual prep and drape, #28 Iglesias resectoscope was introduced into the bladder.  It was inspected once again.  He had a huge median lobe.  I proceeded to resect the median lobe up to the level of the verumontanum.  Then, the bladder neck was circumferentially resected. He has a large amount of tissue in the lateral lobes also.  After resecting the median lobe, I proceeded to do the bladder neck resection circumferentially down to the circular fibers.  Then, the resectoscope was pulled back at the level of the verumontanum, rotated to 11 o'clock position.  A groove was created down to the capsule at 11 o'clock position.  Then the right lobe was resected between 11 and 7 o'clock positions.  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  There is no visual obstruction.  The bleeders were coagulated.  Chips were evacuated, but there is still considerable tissue in the lateral lobes, but he does have any visual obstruction, so I decided to quit the procedure at this point. Resectoscope was removed, 22 three-way Foley catheter inserted, CBI started, which was clear.  The patient left the operating room in satisfactory condition.     Ky Barban, M.D.     MIJ/MEDQ  D:  05/14/2012  T:  05/14/2012  Job:  [id number]

## 2012-05-14 NOTE — Brief Op Note (Signed)
05/14/2012  9:58 AM  PATIENT:  Thomas Garcia  72 y.o. male  PRE-OPERATIVE DIAGNOSIS:  benign prostatic hypertrophy  POST-OPERATIVE DIAGNOSIS:  benign prostatic hypertrophy  PROCEDURE:  Procedure(s) (LRB) with comments: TRANSURETHRAL RESECTION OF THE PROSTATE (TURP) (N/A)  SURGEON:  Surgeon(s) and Role:    * Ky Barban, MD - Primary  PHYSICIAN ASSISTANT:   ASSISTANTS: none   ANESTHESIA:   spinal  EBL:  Total I/O In: 1200 [I.V.:1200] Out: -   BLOOD ADMINISTERED:none  DRAINS: Urinary Catheter (Foley)   LOCAL MEDICATIONS USED:  NONE  SPECIMEN:  Source of Specimen:  prostate chips  DISPOSITION OF SPECIMEN:  PATHOLOGY  COUNTS:  YES  TOURNIQUET:  * No tourniquets in log *  DICTATION: .Other Dictation: Dictation Number dictation [id number]  PLAN OF CARE: Admit for overnight observation  PATIENT DISPOSITION:  PACU - hemodynamically stable.   Delay start of Pharmacological VTE agent (>24hrs) due to surgical blood loss or risk of bleeding:

## 2012-05-14 NOTE — Anesthesia Procedure Notes (Signed)
Spinal  Patient location during procedure: OR Start time: 05/14/2012 8:18 AM Staffing CRNA/Resident: Glynn Octave E Preanesthetic Checklist Completed: patient identified, site marked, surgical consent, pre-op evaluation, timeout performed, IV checked, risks and benefits discussed and monitors and equipment checked Spinal Block Patient position: left lateral decubitus Prep: Betadine Patient monitoring: heart rate, cardiac monitor, continuous pulse ox and blood pressure Approach: left paramedian Location: L3-4 Injection technique: single-shot Needle Needle type: Spinocan  Needle gauge: 22 G Needle length: 9 cm Assessment Sensory level: T8 Additional Notes  ATTEMPTS:1 TRAY YN:[id number] TRAY EXPIRATION DATE:03/2013

## 2012-05-15 ENCOUNTER — Encounter (HOSPITAL_COMMUNITY): Payer: Self-pay | Admitting: Urology

## 2012-05-15 LAB — CBC
HCT: 37.8 % — ABNORMAL LOW (ref 39.0–52.0)
MCHC: 33.9 g/dL (ref 30.0–36.0)
MCV: 93.1 fL (ref 78.0–100.0)
Platelets: 317 10*3/uL (ref 150–400)
RDW: 14 % (ref 11.5–15.5)

## 2012-05-15 LAB — BASIC METABOLIC PANEL
BUN: 5 mg/dL — ABNORMAL LOW (ref 6–23)
Creatinine, Ser: 0.56 mg/dL (ref 0.50–1.35)
GFR calc Af Amer: >90 mL/min (ref >90)
GFR calc non Af Amer: >90 mL/min (ref >90)
Potassium: 3.5 mEq/L (ref 3.5–5.1)

## 2012-05-15 MED ORDER — MAGNESIUM HYDROXIDE 400 MG/5ML PO SUSP
30.0000 mL | Freq: Every day | ORAL | Status: DC | PRN
  Administered 2012-05-15: 30 mL via ORAL
  Filled 2012-05-15: qty 30

## 2012-05-15 MED ORDER — OXYCODONE-ACETAMINOPHEN 7.5-325 MG PO TABS: 1.0000 | ORAL_TABLET | Freq: Four times a day (QID) | ORAL | Status: DC | PRN

## 2012-05-15 NOTE — Progress Notes (Signed)
IV removed, site WNL.  Pt given d/c instructions and new prescriptions.  Discussed home care with patient and discussed home medications, patient verbalizes understanding, care of foley, switching from leg bad to large drainage bag, handwashing all discussed and pt able to demonstrate correct technique, teachback completed. F/U appointment in place for Monday, pt states they will keep appointment. Pt is stable at this time. Pt taken to main entrance in wheelchair by staff member.

## 2012-05-15 NOTE — Progress Notes (Signed)
UR Chart Review Completed  

## 2012-05-15 NOTE — Discharge Summary (Signed)
NAMEMarland Kitchen  JOVANIE, VERGE               ACCOUNT NO.:  [id number]  MEDICAL RECORD NO.:  [id number]  LOCATION:  A339                          FACILITY:  [facility]  PHYSICIAN:  Ky Barban, M.D.DATE OF BIRTH:  01/14/1941  DATE OF ADMISSION:  05/14/2012 DATE OF DISCHARGE:  02/06/2014LH                              DISCHARGE SUMMARY   Thomas Garcia is a 72 year old gentleman.  He was admitted in the hospital because he has BPH with bladder neck obstruction with history of gross hematuria.  He underwent TUR prostate yesterday.  Postoperative course was benign.  He has remained afebrile.  His CBI is absolutely clear. His hematocrit is 37.6.  WBC count is 11.1.  He is feeling fine except he has some constipation.  I am going to go ahead and give him milk of magnesia 1 ounce and after that he can be discharged home with Foley catheter, which I want to keep a couple of more days.  I will take it out Monday in the office.  I told him to come 10 o'clock on Monday.  His final pathology report is still pending.  He is going to go home with Flomax and Percocet.  FINAL DISCHARGE DIAGNOSIS:  Benign prostatic hypertrophy.  DISCHARGE CONDITION:  Improved.  DISCHARGE MEDICATIONS:  Flomax 0.4 mg 1 a day, and Percocet 7.5 mg 1 q.6 h. p.r.n., #30.  He is going to go home with Foley catheter which I will take it out on Monday.  I have told him if he has any fever or bleeding to let me know.  FINAL DISCHARGE DIAGNOSIS:  Benign prostatic hypertrophy.  DISCHARGE CONDITION:  Improved.     Ky Barban, M.D.     MIJ/MEDQ  D:  05/15/2012  T:  05/15/2012  Job:  [id number]

## 2012-05-15 NOTE — Progress Notes (Signed)
NAMEMarland Kitchen  Thomas Garcia, Thomas Garcia               ACCOUNT NO.:  [id number]  MEDICAL RECORD NO.:  [id number]  LOCATION:  A339                          FACILITY:  [facility]  PHYSICIAN:  Ky Barban, M.D.DATE OF BIRTH:  07/08/1940  DATE OF PROCEDURE: DATE OF DISCHARGE:  05/15/2012                                PROGRESS NOTE   [name] had TUR prostate done yesterday.  He clinically looks fine. His blood pressure 116/58, temperature is 98.1, and pulse is 78 per minute.  His CBI is clear.  His WBC count is 11.1, hematocrit 37.8. Sodium 138, potassium 3.5, chloride 104, CO2 is 25, BUN is 5, creatinine 0.56.  PLAN:  I am going to discontinue his CBI, get him out of bed and if the urine stays clear, then he will be going home with Foley catheter which I will take it out on Monday in the office.     Ky Barban, M.D.     MIJ/MEDQ  D:  05/15/2012  T:  05/15/2012  Job:  [id number]

## 2012-05-15 NOTE — Discharge Summary (Signed)
Discharge [phone]

## 2012-05-15 NOTE — Progress Notes (Signed)
Note [phone]

## 2012-05-15 NOTE — Progress Notes (Signed)
CBI discontinued per Dr Tillie Rung order.  Pt OOB to chair, tolerated well.

## 2013-02-12 ENCOUNTER — Other Ambulatory Visit: Payer: Self-pay

## 2018-09-08 ENCOUNTER — Emergency Department (HOSPITAL_COMMUNITY): Payer: Medicare Other

## 2018-09-08 ENCOUNTER — Other Ambulatory Visit: Payer: Self-pay

## 2018-09-08 ENCOUNTER — Inpatient Hospital Stay (HOSPITAL_COMMUNITY): Payer: Medicare Other

## 2018-09-08 ENCOUNTER — Encounter (HOSPITAL_COMMUNITY): Payer: Self-pay

## 2018-09-08 ENCOUNTER — Inpatient Hospital Stay (HOSPITAL_COMMUNITY)
Admission: EM | Admit: 2018-09-08 | Discharge: 2018-09-11 | DRG: 617 | Disposition: A | Discharge status: Home / Self Care | Payer: Medicare Other | Attending: Internal Medicine | Admitting: Internal Medicine

## 2018-09-08 DIAGNOSIS — M868X6 Other osteomyelitis, lower leg: Secondary | ICD-10-CM | POA: Diagnosis present

## 2018-09-08 DIAGNOSIS — I739 Peripheral vascular disease, unspecified: Secondary | ICD-10-CM | POA: Diagnosis not present

## 2018-09-08 DIAGNOSIS — F17200 Nicotine dependence, unspecified, uncomplicated: Secondary | ICD-10-CM | POA: Diagnosis not present

## 2018-09-08 DIAGNOSIS — E1169 Type 2 diabetes mellitus with other specified complication: Principal | ICD-10-CM | POA: Diagnosis present

## 2018-09-08 DIAGNOSIS — R509 Fever, unspecified: Secondary | ICD-10-CM | POA: Diagnosis not present

## 2018-09-08 DIAGNOSIS — E1165 Type 2 diabetes mellitus with hyperglycemia: Secondary | ICD-10-CM | POA: Diagnosis present

## 2018-09-08 DIAGNOSIS — I10 Essential (primary) hypertension: Secondary | ICD-10-CM | POA: Diagnosis present

## 2018-09-08 DIAGNOSIS — S91105A Unspecified open wound of left lesser toe(s) without damage to nail, initial encounter: Secondary | ICD-10-CM | POA: Diagnosis present

## 2018-09-08 DIAGNOSIS — E1151 Type 2 diabetes mellitus with diabetic peripheral angiopathy without gangrene: Secondary | ICD-10-CM | POA: Diagnosis not present

## 2018-09-08 DIAGNOSIS — L03116 Cellulitis of left lower limb: Secondary | ICD-10-CM | POA: Diagnosis present

## 2018-09-08 DIAGNOSIS — L97529 Non-pressure chronic ulcer of other part of left foot with unspecified severity: Secondary | ICD-10-CM | POA: Diagnosis present

## 2018-09-08 DIAGNOSIS — F17210 Nicotine dependence, cigarettes, uncomplicated: Secondary | ICD-10-CM | POA: Diagnosis present

## 2018-09-08 DIAGNOSIS — M868X7 Other osteomyelitis, ankle and foot: Secondary | ICD-10-CM | POA: Diagnosis present

## 2018-09-08 DIAGNOSIS — Z87442 Personal history of urinary calculi: Secondary | ICD-10-CM

## 2018-09-08 DIAGNOSIS — I96 Gangrene, not elsewhere classified: Secondary | ICD-10-CM | POA: Diagnosis not present

## 2018-09-08 DIAGNOSIS — M869 Osteomyelitis, unspecified: Secondary | ICD-10-CM | POA: Diagnosis not present

## 2018-09-08 DIAGNOSIS — D72829 Elevated white blood cell count, unspecified: Secondary | ICD-10-CM | POA: Diagnosis not present

## 2018-09-08 DIAGNOSIS — E1159 Type 2 diabetes mellitus with other circulatory complications: Secondary | ICD-10-CM | POA: Diagnosis present

## 2018-09-08 DIAGNOSIS — Z20828 Contact with and (suspected) exposure to other viral communicable diseases: Secondary | ICD-10-CM | POA: Diagnosis present

## 2018-09-08 DIAGNOSIS — E1152 Type 2 diabetes mellitus with diabetic peripheral angiopathy with gangrene: Secondary | ICD-10-CM | POA: Diagnosis present

## 2018-09-08 DIAGNOSIS — Z79899 Other long term (current) drug therapy: Secondary | ICD-10-CM | POA: Diagnosis not present

## 2018-09-08 DIAGNOSIS — Z01818 Encounter for other preprocedural examination: Secondary | ICD-10-CM | POA: Diagnosis not present

## 2018-09-08 LAB — URINALYSIS, ROUTINE W REFLEX MICROSCOPIC
Bilirubin Urine: NEGATIVE
Glucose, UA: >=500 mg/dL — AB
Ketones, ur: 5 mg/dL — AB
Leukocytes,Ua: NEGATIVE
Nitrite: NEGATIVE
Protein, ur: 30 mg/dL — AB
Specific Gravity, Urine: 1.014 (ref 1.005–1.030)
pH: 6 (ref 5.0–8.0)

## 2018-09-08 LAB — CBC WITH DIFFERENTIAL/PLATELET
Abs Immature Granulocytes: 0.05 10*3/uL (ref 0.00–0.07)
Basophils Absolute: 0.1 10*3/uL (ref 0.0–0.1)
Basophils Relative: 1 %
Eosinophils Absolute: 0 10*3/uL (ref 0.0–0.5)
Eosinophils Relative: 0 %
HCT: 42.1 % (ref 39.0–52.0)
Hemoglobin: 13.6 g/dL (ref 13.0–17.0)
Immature Granulocytes: 0 %
Lymphocytes Relative: 7 %
Lymphs Abs: 1.4 10*3/uL (ref 0.7–4.0)
MCH: 29.6 pg (ref 26.0–34.0)
MCHC: 32.3 g/dL (ref 30.0–36.0)
MCV: 91.5 fL (ref 80.0–100.0)
Monocytes Absolute: 1.5 10*3/uL — ABNORMAL HIGH (ref 0.1–1.0)
Monocytes Relative: 7 %
Neutro Abs: 17 10*3/uL — ABNORMAL HIGH (ref 1.7–7.7)
Neutrophils Relative %: 85 %
Platelets: 392 10*3/uL (ref 150–400)
RBC: 4.6 MIL/uL (ref 4.22–5.81)
RDW: 13.5 % (ref 11.5–15.5)
WBC: 20 10*3/uL — ABNORMAL HIGH (ref 4.0–10.5)
nRBC: 0 % (ref 0.0–0.2)

## 2018-09-08 LAB — PROTIME-INR
INR: 1.1 (ref 0.8–1.2)
Prothrombin Time: 13.7 seconds (ref 11.4–15.2)

## 2018-09-08 LAB — SARS CORONAVIRUS 2 BY RT PCR (HOSPITAL ORDER, PERFORMED IN ~~LOC~~ HOSPITAL LAB): SARS Coronavirus 2: NEGATIVE

## 2018-09-08 LAB — CBG MONITORING, ED: Glucose-Capillary: 190 mg/dL — ABNORMAL HIGH (ref 70–99)

## 2018-09-08 LAB — COMPREHENSIVE METABOLIC PANEL
ALT: 12 U/L (ref 0–44)
AST: 11 U/L — ABNORMAL LOW (ref 15–41)
Albumin: 3.3 g/dL — ABNORMAL LOW (ref 3.5–5.0)
Alkaline Phosphatase: 95 U/L (ref 38–126)
Anion gap: 13 (ref 5–15)
BUN: 14 mg/dL (ref 8–23)
CO2: 25 mmol/L (ref 22–32)
Calcium: 8.9 mg/dL (ref 8.9–10.3)
Chloride: 98 mmol/L (ref 98–111)
Creatinine, Ser: 0.71 mg/dL (ref 0.61–1.24)
GFR calc Af Amer: >60 mL/min (ref >60)
GFR calc non Af Amer: >60 mL/min (ref >60)
Glucose, Bld: 204 mg/dL — ABNORMAL HIGH (ref 70–99)
Potassium: 4.4 mmol/L (ref 3.5–5.1)
Sodium: 136 mmol/L (ref 135–145)
Total Bilirubin: 0.7 mg/dL (ref 0.3–1.2)
Total Protein: 7.6 g/dL (ref 6.5–8.1)

## 2018-09-08 LAB — C-REACTIVE PROTEIN: CRP: 16 mg/dL — ABNORMAL HIGH (ref <1.0)

## 2018-09-08 LAB — HEMOGLOBIN A1C
Hgb A1c MFr Bld: 9 % — ABNORMAL HIGH (ref 4.8–5.6)
Mean Plasma Glucose: 211.6 mg/dL

## 2018-09-08 LAB — LACTIC ACID, PLASMA
Lactic Acid, Venous: 1.3 mmol/L (ref 0.5–1.9)
Lactic Acid, Venous: 1.3 mmol/L (ref 0.5–1.9)

## 2018-09-08 LAB — GLUCOSE, CAPILLARY
Glucose-Capillary: 139 mg/dL — ABNORMAL HIGH (ref 70–99)
Glucose-Capillary: 127 mg/dL — ABNORMAL HIGH (ref 70–99)

## 2018-09-08 LAB — SEDIMENTATION RATE: Sed Rate: 72 mm/hr — ABNORMAL HIGH (ref 0–16)

## 2018-09-08 IMAGING — MR MRI OF THE LEFT FOOT WITHOUT CONTRAST
4 of 5 series · 19 of 40 positions shown · non-contrast
Comparison: Radiographs same date

CLINICAL DATA: Left foot wound with swelling for 6 months.
Diabetes. Osteomyelitis suspected.

EXAM:
MRI OF THE LEFT FOOT WITHOUT CONTRAST
TECHNIQUE: Multiplanar, multisequence MR imaging of the left forefoot was
performed. No intravenous contrast was administered.

[Series 4: T1 · coronal · 4.0mm · 0.30mm/px · 3 of 36 slices shown (1 of 2)]
[im 7/36]
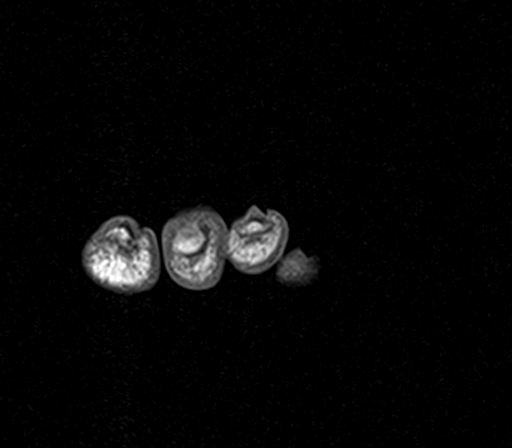
[im 20/36]
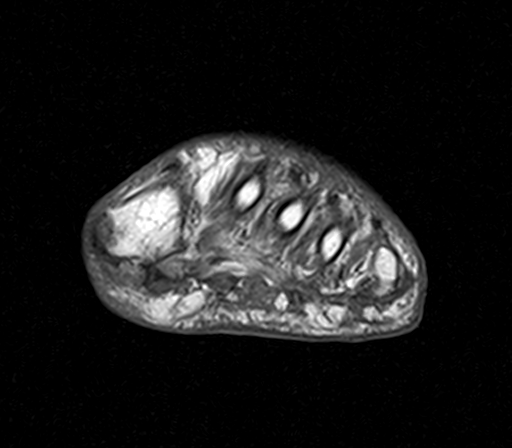
[im 32/36]
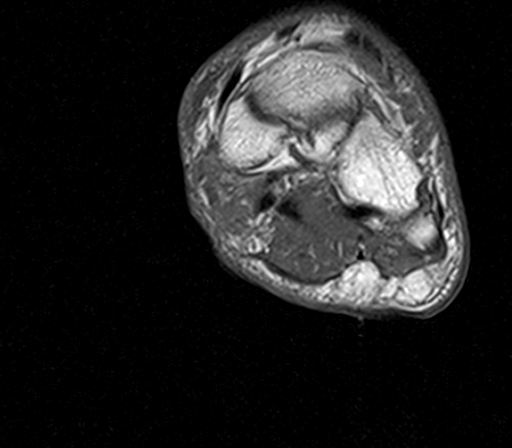

[Series 5: T2 fat-sat · coronal · 4.0mm · 0.30mm/px · 10 of 36 slices shown (1 of 2)]
[im 1/36]
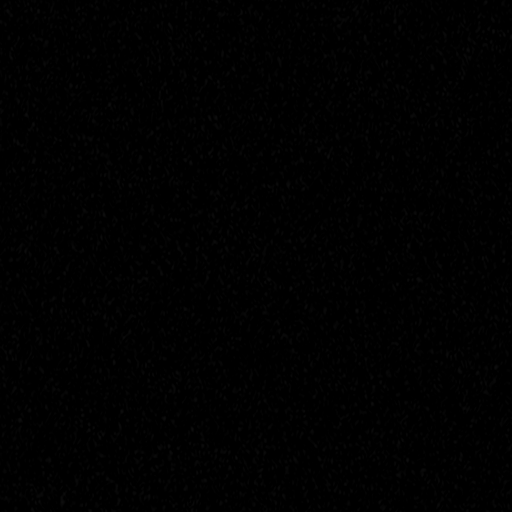
[im 4/36]
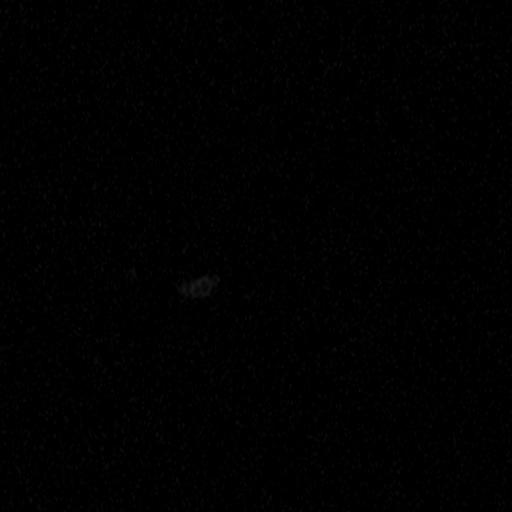
[im 8/36]
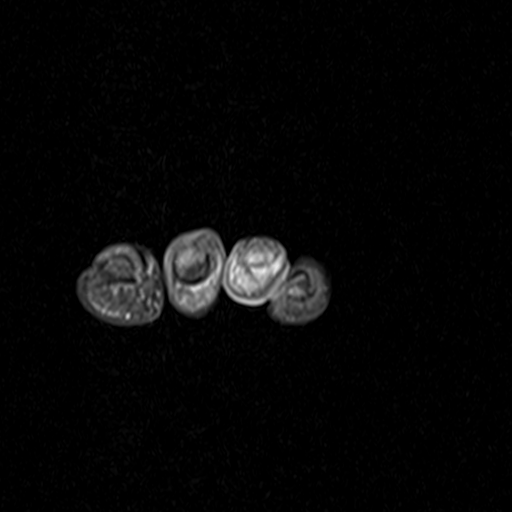
[im 11/36]
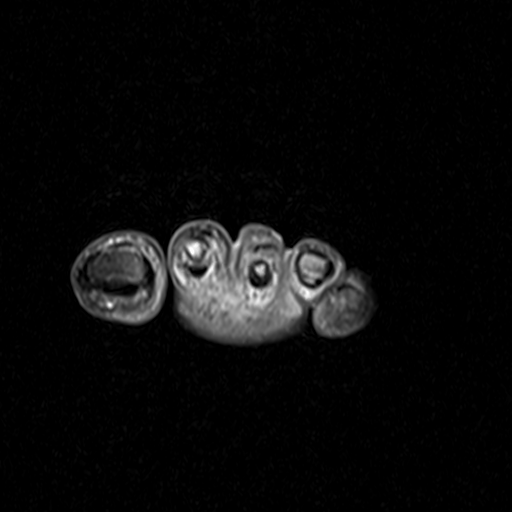
[im 15/36]
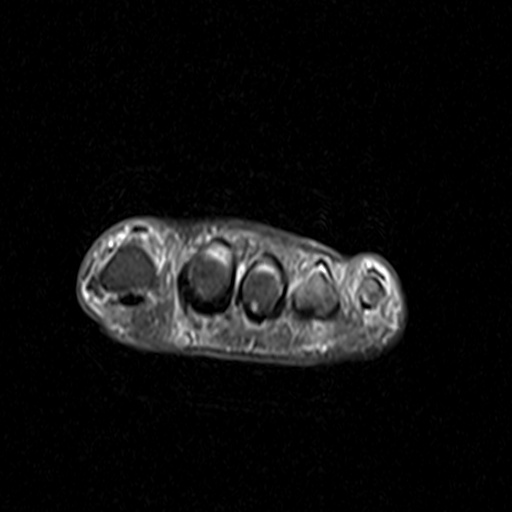
[im 18/36]
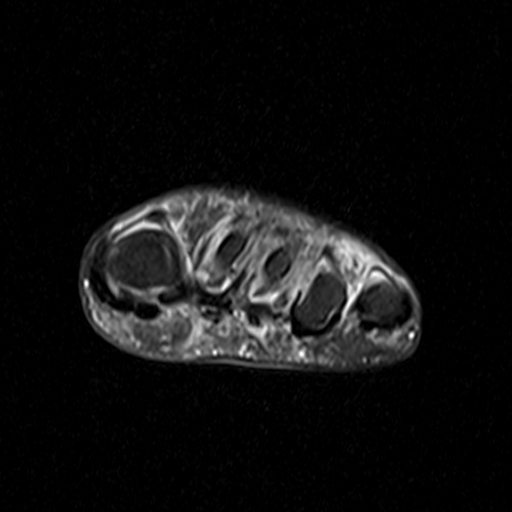
[im 22/36]
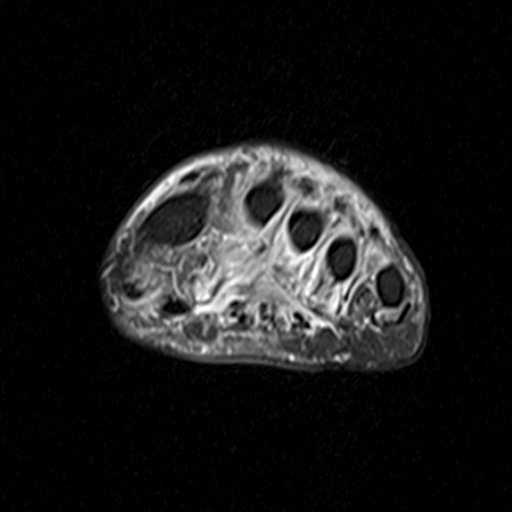
[im 25/36]
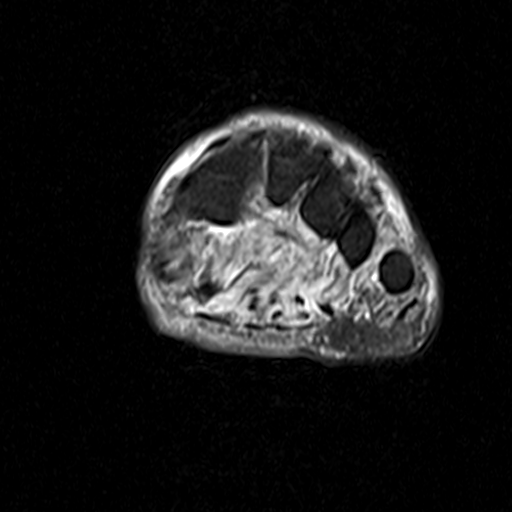
[im 29/36]
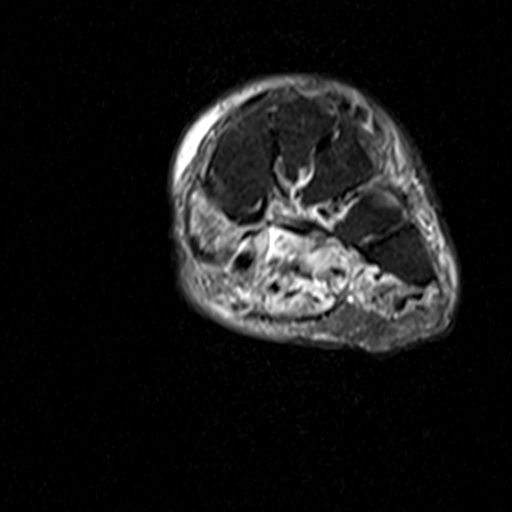
[im 32/36]
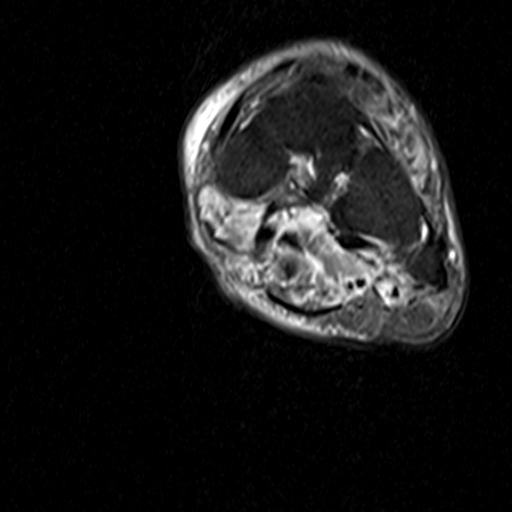

[Series 6: T2 fat-sat · axial · 4.0mm · 0.35mm/px · z∈[-54,+21]mm · 3 of 18 slices shown (2 of 2)]
[im 1/18]
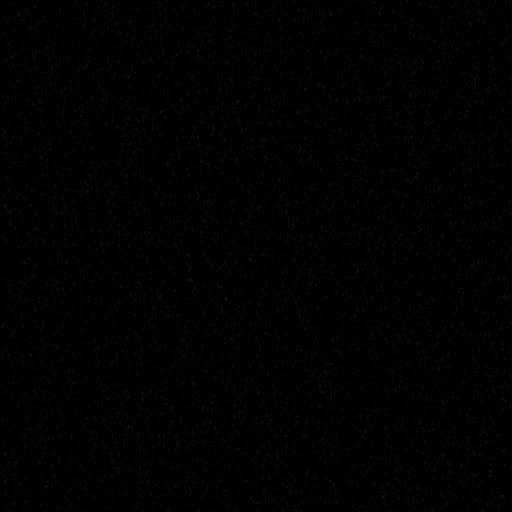
[im 9/18]
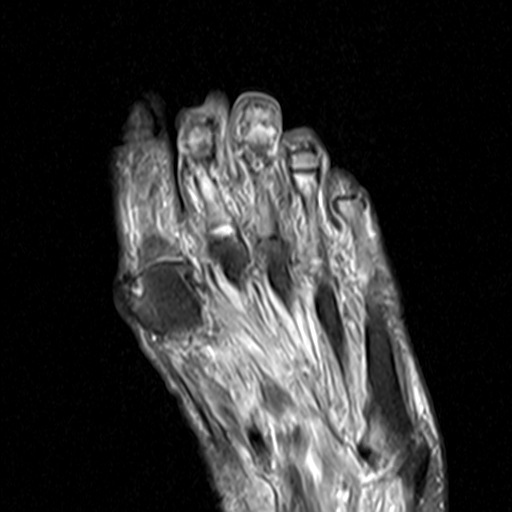
[im 18/18]
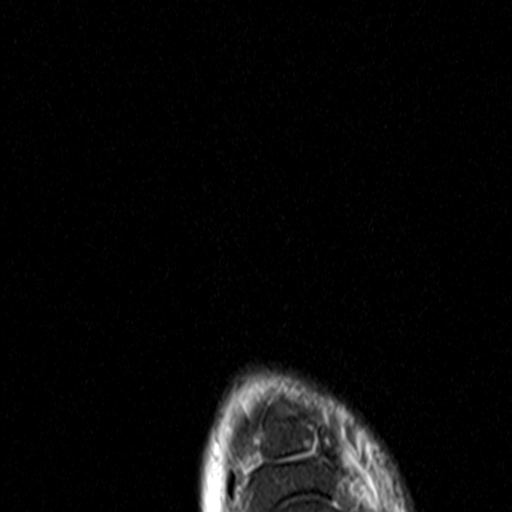

[Series 9: T1 · axial · 4.0mm · 0.35mm/px · z∈[-54,+18]mm · 3 of 18 slices shown (2 of 2)]
[im 1/18]
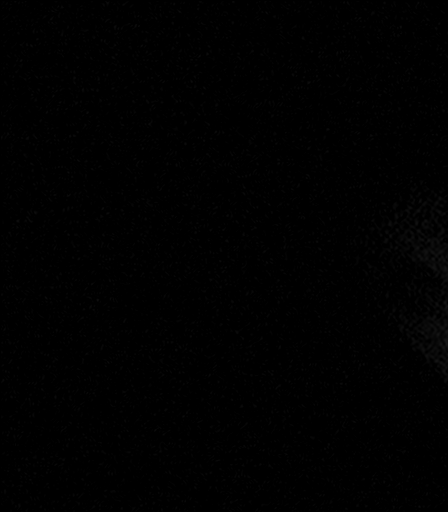
[im 9/18]
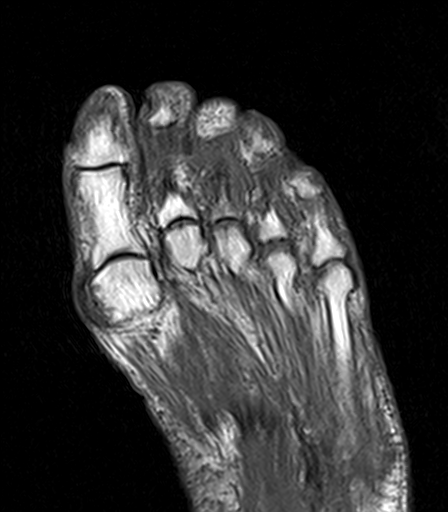
[im 18/18]
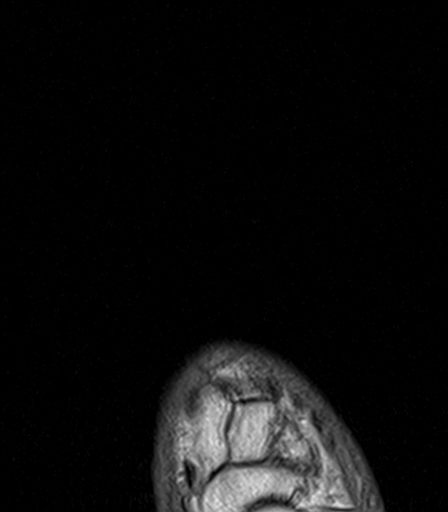

[19 of 40 positions shown; findings below may reference images not displayed]

FINDINGS: Despite efforts by the technologist and patient, mild motion
artifact is present on today's exam and could not be eliminated.
This reduces exam sensitivity and specificity.

Bones/Joint/Cartilage

Marrow assessment is limited by motion and incomplete fat saturation
on the T2 weighted images. Sagittal inversion recovery images were
obtained. There is cortical destruction with increased T2 and
decreased T1 marrow signal in the head of the 3rd proximal phalanx,
and probably the base of the 3rd middle phalanx, suspicious for
osteomyelitis as correlated with the earlier radiographs. There is
possible involvement of the 2nd metatarsal head as well, although
this is less definitive. The 1st, 4th and 5th toes appear intact.
There are mild degenerative changes at the 1st metatarsophalangeal
joint. No significant joint effusions. The alignment is normal at
the Lisfranc joint.

Ligaments

Intact Lisfranc ligament. The collateral ligaments of the
metatarsophalangeal joints are intact.

Muscles and Tendons

Increased T2 signal throughout the forefoot musculature, most likely
due to underlying diabetes. No focal fluid collection or
tenosynovitis.

Soft tissues

There is soft tissue swelling in the 3rd toe. In correlation with
the earlier radiographs, there is soft tissue emphysema as well.
There is possible mild soft tissue swelling in the 2nd toe. No focal
fluid collections are identified.
IMPRESSION: 1. In correlation with the earlier radiographs, the findings are
highly suspicious for osteomyelitis involving the head of the 3rd
proximal phalanx, and likely the 3rd middle phalanx.
2. Possible osteomyelitis of the 2nd proximal phalangeal head.
3. Soft tissue swelling in the 3rd and 2nd toes without focal fluid
collection.

## 2018-09-08 IMAGING — DX CHEST - 2 VIEW
2 series · 2 of 2 positions shown · non-contrast
Comparison: None.

CLINICAL DATA: Suspected sepsis. Pt has open wounds to 3rd toe of
Lt foot with redness and swelling to entire Lt foot. Pt states
wounds have been present x several weeks. Pt c/o chills
intermittently. Hx smoker, diabetes Suspected Sepsis

EXAM:
CHEST - 2 VIEW

[chest lat]
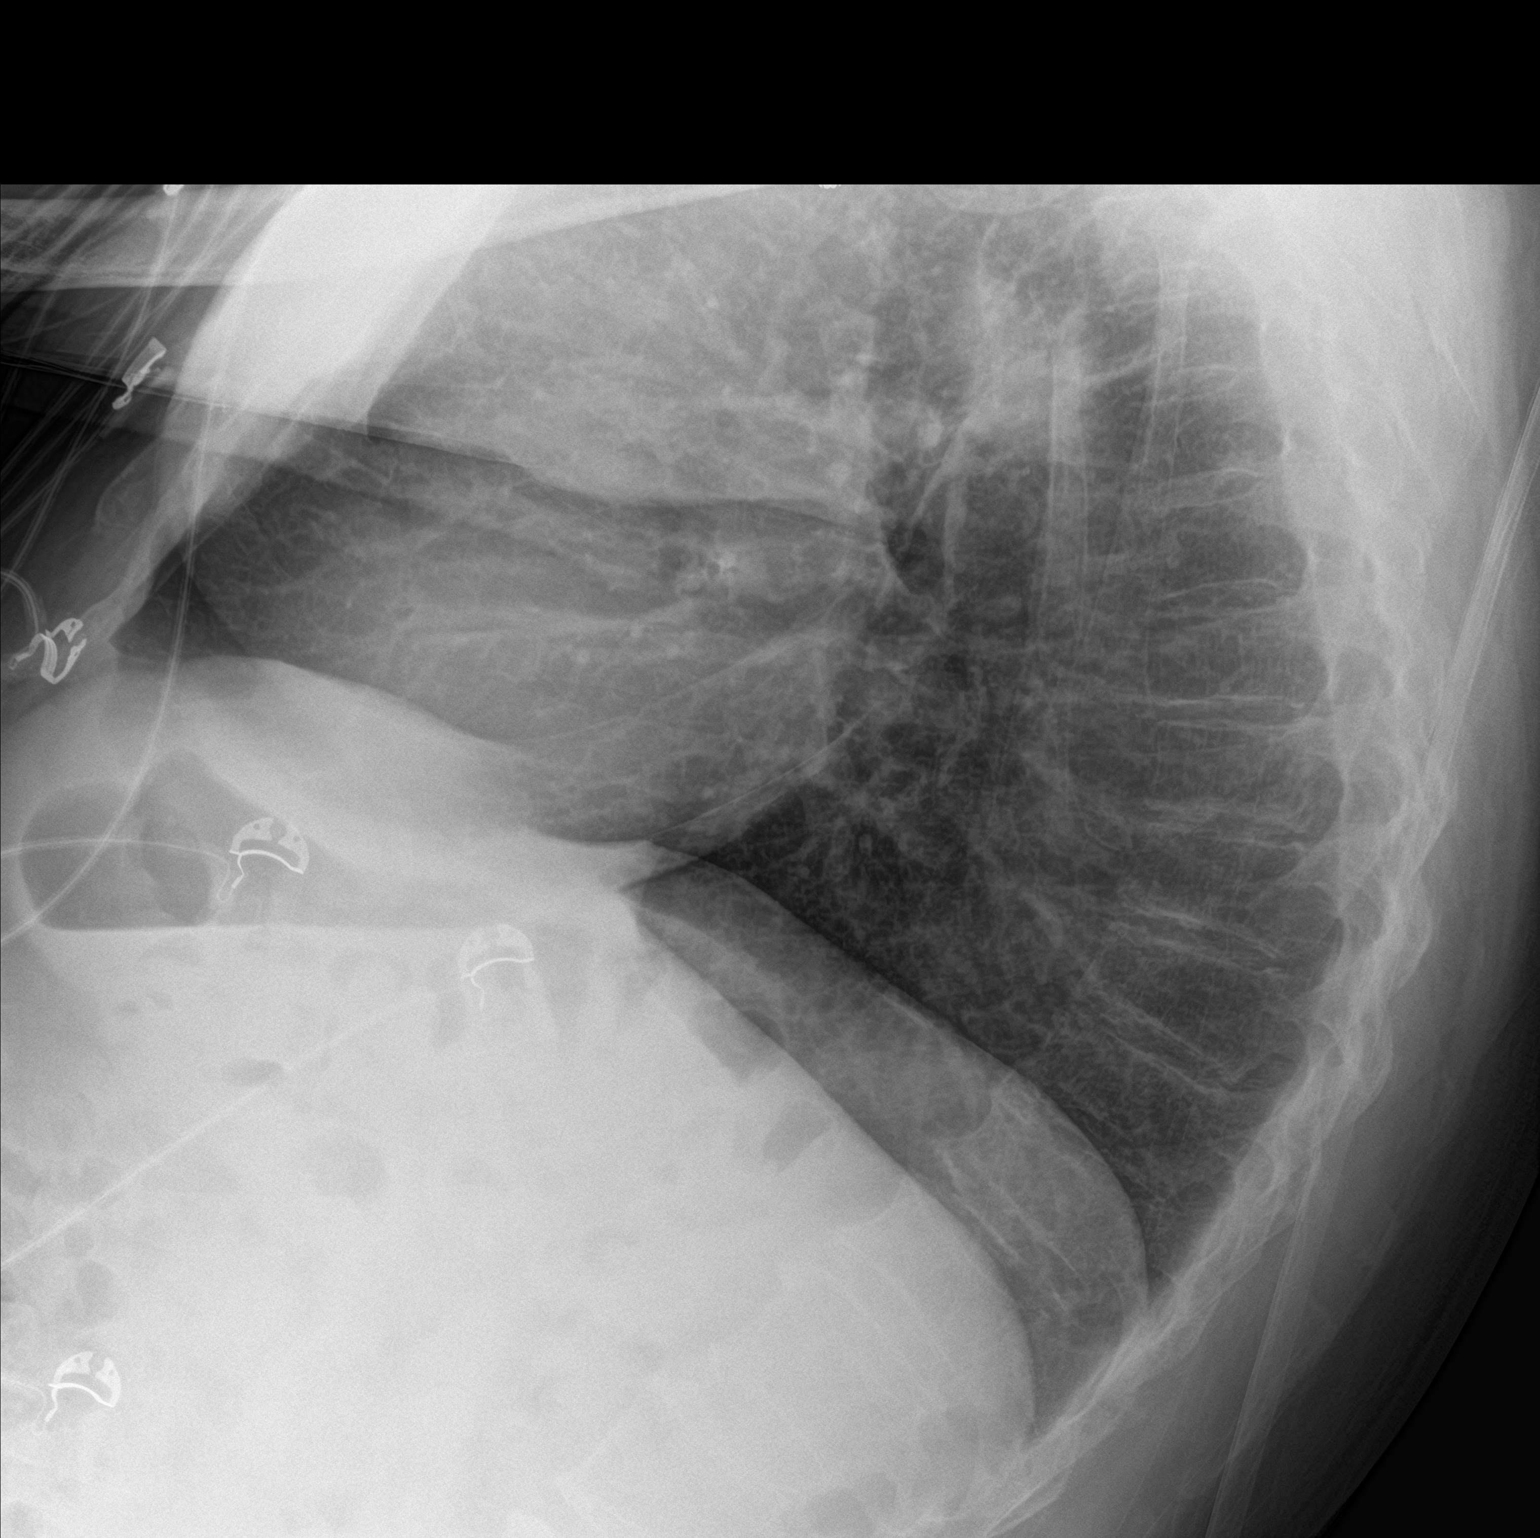

[chest ap]
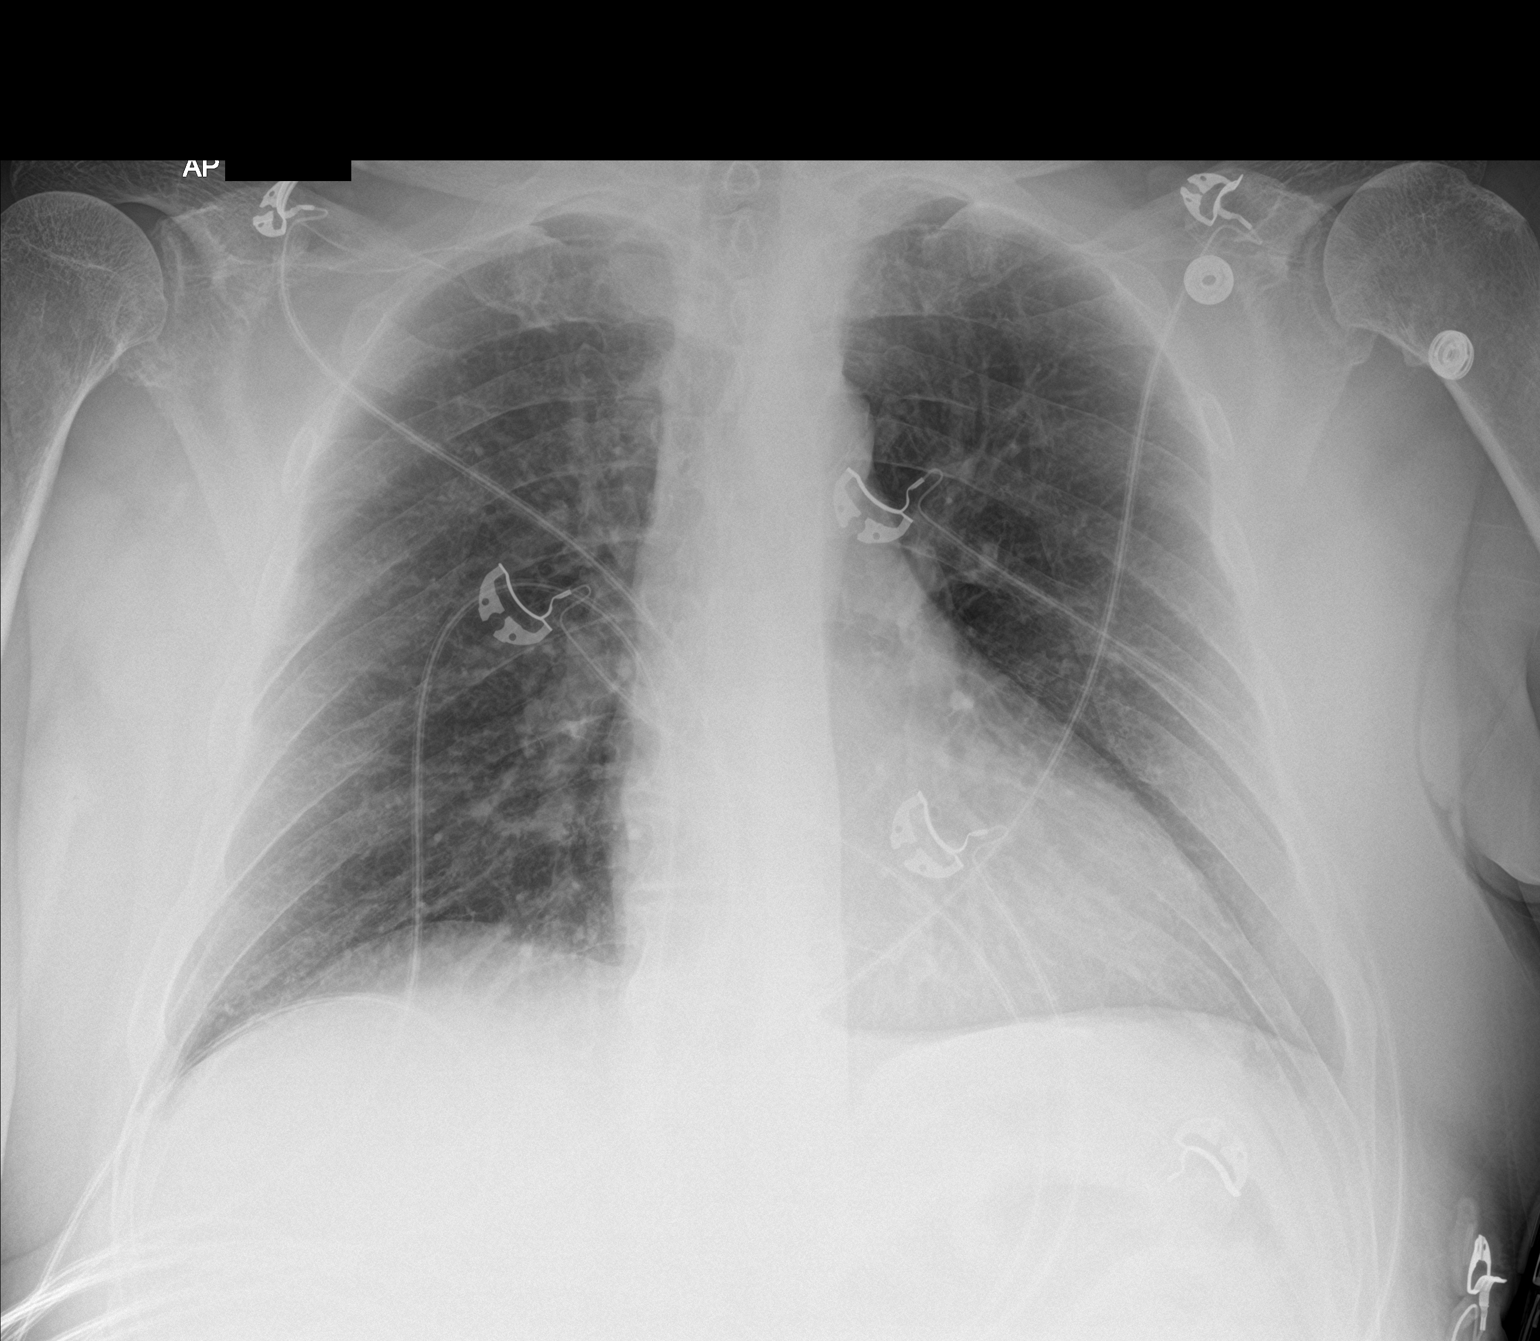

[2 of 2 positions shown; findings below may reference images not displayed]

FINDINGS: Normal mediastinum and cardiac silhouette. Normal pulmonary
vasculature. No evidence of effusion, infiltrate, or pneumothorax.
No acute bony abnormality.
IMPRESSION: No acute cardiopulmonary process.

## 2018-09-08 IMAGING — DX LEFT FOOT - COMPLETE 3+ VIEW
3 series · 3 of 3 positions shown · non-contrast
Comparison: None.

CLINICAL DATA: Third toe wound, initial encounter

EXAM:
LEFT FOOT - COMPLETE 3+ VIEW

[foot ap]
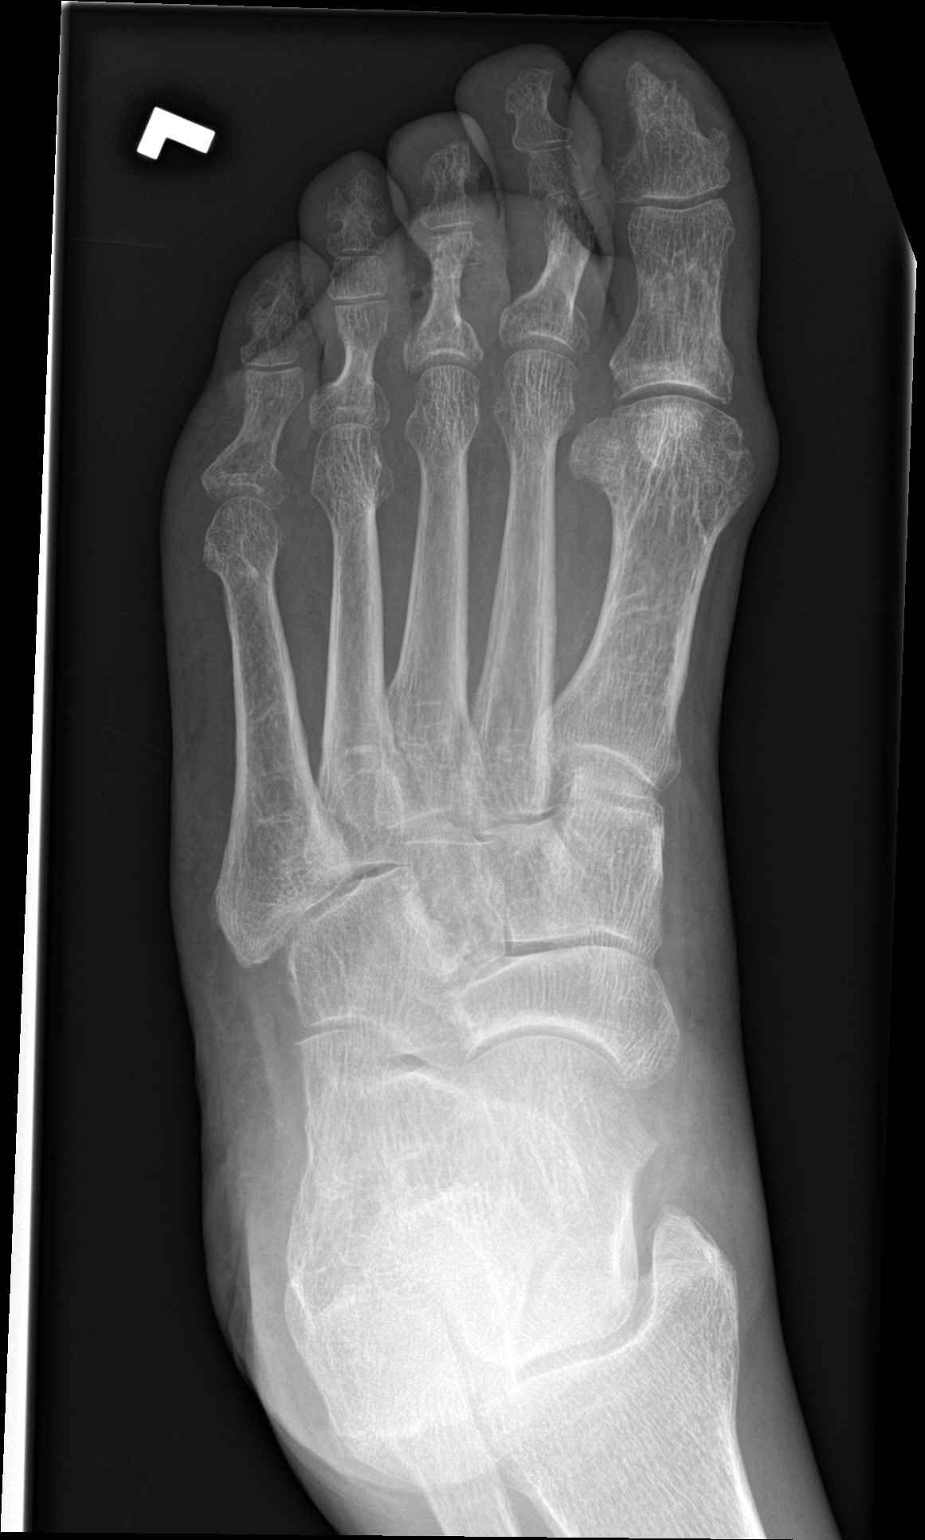

[foot obl]
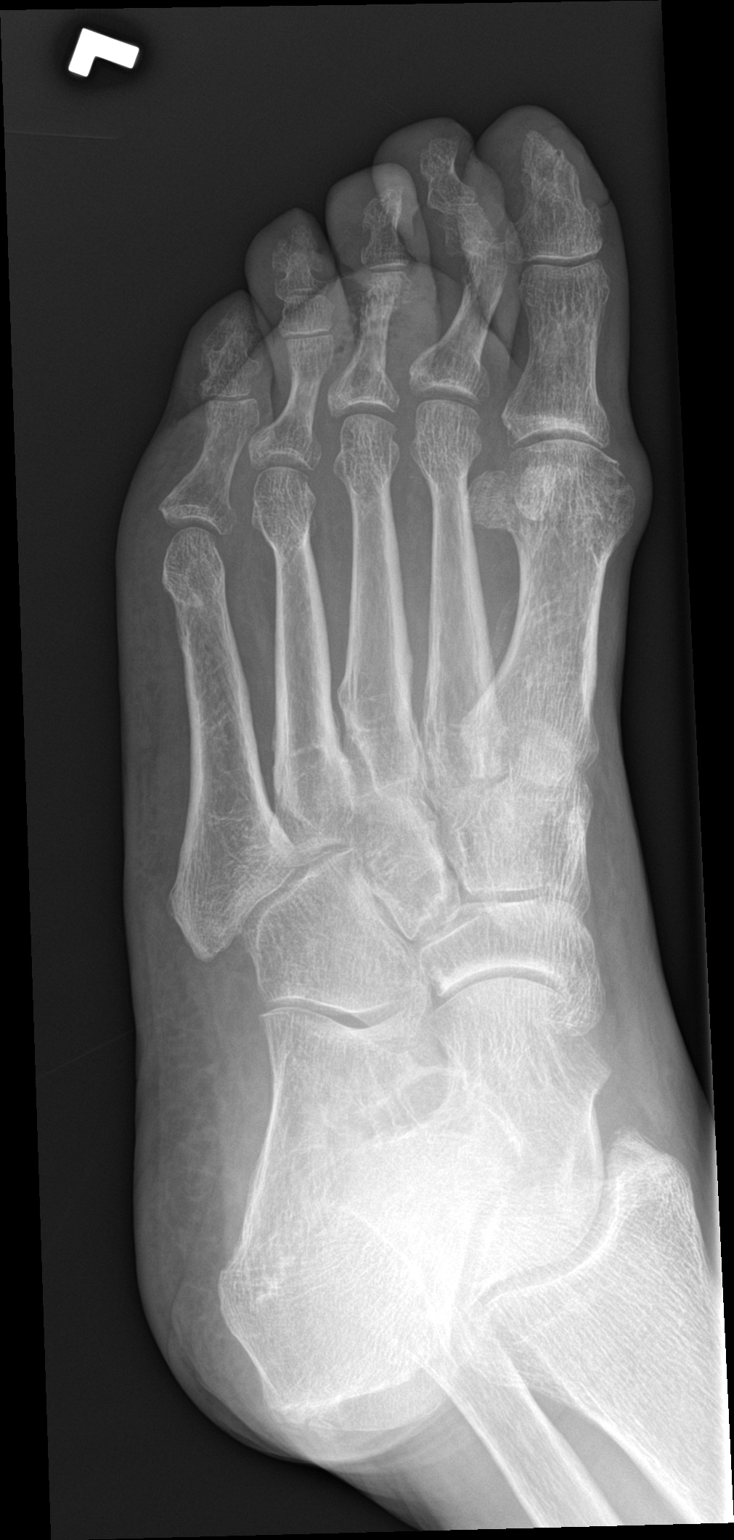

[foot lat]
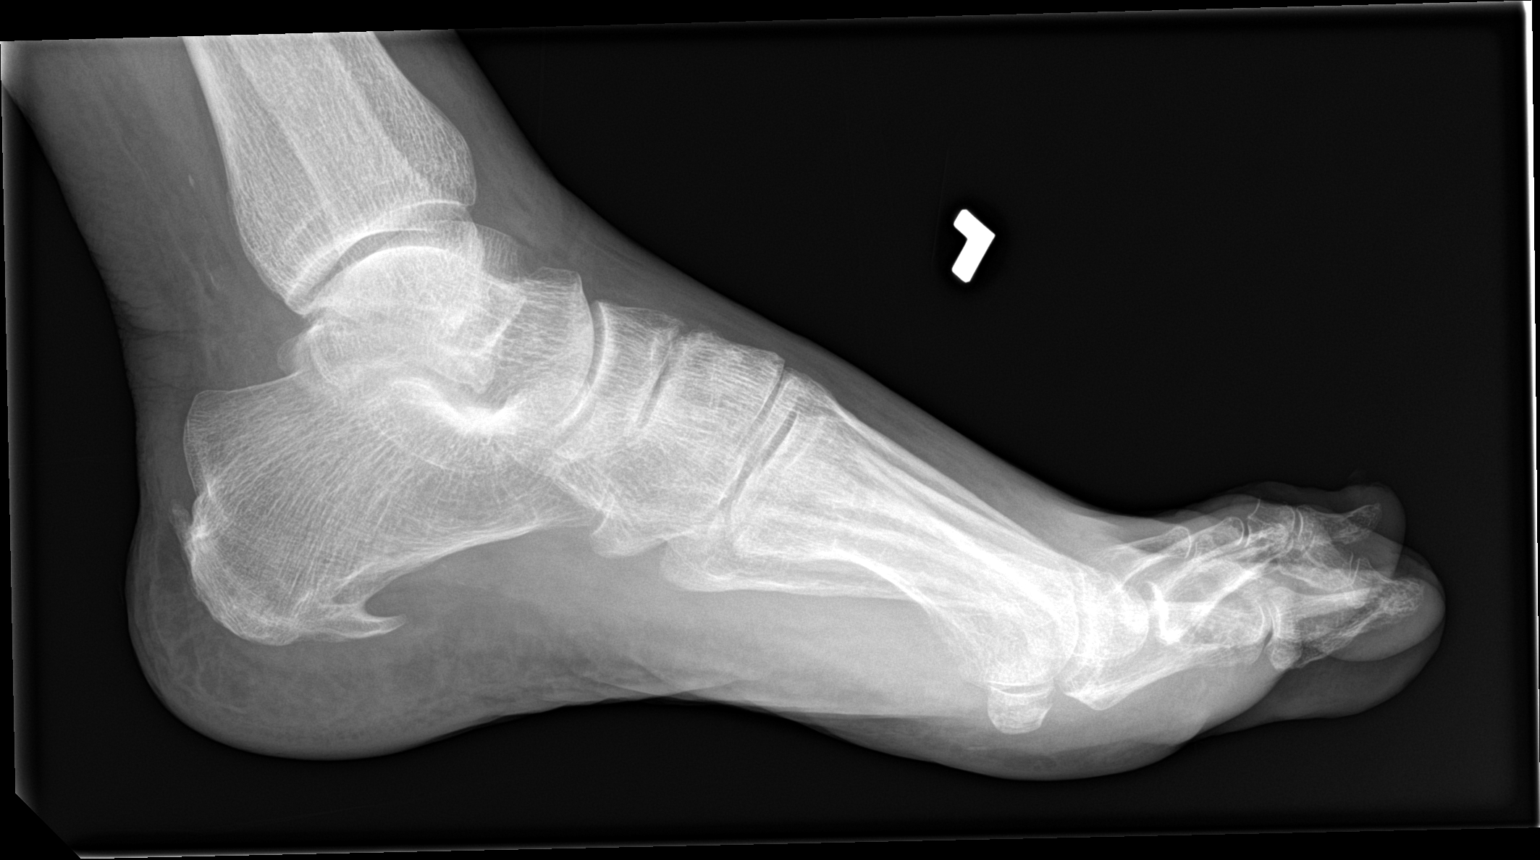

[3 of 3 positions shown; findings below may reference images not displayed]

FINDINGS: Soft tissue gas is noted in the third toe near its base. Some bony
resorption is noted in the distal aspect of the third proximal
phalanx.
IMPRESSION: Soft tissue infection involving the third toe with some resorption
in the distal aspect of the third proximal phalanx consistent with
osteomyelitis.

## 2018-09-08 MED ORDER — INSULIN ASPART 100 UNIT/ML ~~LOC~~ SOLN
0.0000 [IU] | Freq: Three times a day (TID) | SUBCUTANEOUS | Status: DC
  Administered 2018-09-08 – 2018-09-09 (×3): 2 [IU] via SUBCUTANEOUS
  Administered 2018-09-10 – 2018-09-11 (×2): 3 [IU] via SUBCUTANEOUS

## 2018-09-08 MED ORDER — PROMETHAZINE HCL 25 MG/ML IJ SOLN
12.5000 mg | Freq: Once | INTRAMUSCULAR | Status: AC
  Administered 2018-09-08: 12.5 mg via INTRAVENOUS
  Filled 2018-09-08: qty 1

## 2018-09-08 MED ORDER — HEPARIN SODIUM (PORCINE) 5000 UNIT/ML IJ SOLN
5000.0000 [IU] | Freq: Three times a day (TID) | INTRAMUSCULAR | Status: DC
  Administered 2018-09-08 – 2018-09-11 (×9): 5000 [IU] via SUBCUTANEOUS
  Filled 2018-09-08 (×9): qty 1

## 2018-09-08 MED ORDER — INSULIN ASPART 100 UNIT/ML ~~LOC~~ SOLN
0.0000 [IU] | Freq: Every day | SUBCUTANEOUS | Status: DC
  Administered 2018-09-10: 3 [IU] via SUBCUTANEOUS

## 2018-09-08 MED ORDER — PIPERACILLIN-TAZOBACTAM 3.375 G IVPB 30 MIN
3.3750 g | Freq: Once | INTRAVENOUS | Status: AC
  Administered 2018-09-08: 3.375 g via INTRAVENOUS
  Filled 2018-09-08: qty 50

## 2018-09-08 MED ORDER — HYDRALAZINE HCL 20 MG/ML IJ SOLN: 10.0000 mg | INTRAMUSCULAR | Status: DC | PRN

## 2018-09-08 MED ORDER — METRONIDAZOLE IN NACL 5-0.79 MG/ML-% IV SOLN
500.0000 mg | Freq: Three times a day (TID) | INTRAVENOUS | Status: DC
  Administered 2018-09-08 – 2018-09-11 (×9): 500 mg via INTRAVENOUS
  Filled 2018-09-08 (×9): qty 100

## 2018-09-08 MED ORDER — SODIUM CHLORIDE 0.9 % IV SOLN
2.0000 g | INTRAVENOUS | Status: DC
  Administered 2018-09-08: 17:00:00 via INTRAVENOUS
  Administered 2018-09-09 – 2018-09-11 (×4): 2 g via INTRAVENOUS
  Filled 2018-09-08 (×4): qty 20

## 2018-09-08 MED ORDER — HYDROCODONE-ACETAMINOPHEN 5-325 MG PO TABS
1.0000 | ORAL_TABLET | Freq: Once | ORAL | Status: AC
  Administered 2018-09-08: 1 via ORAL
  Filled 2018-09-08: qty 1

## 2018-09-08 MED ORDER — INSULIN GLARGINE 100 UNIT/ML ~~LOC~~ SOLN
10.0000 [IU] | Freq: Every day | SUBCUTANEOUS | Status: DC
  Administered 2018-09-08 – 2018-09-10 (×3): 10 [IU] via SUBCUTANEOUS
  Filled 2018-09-08 (×4): qty 0.1

## 2018-09-08 MED ORDER — VANCOMYCIN HCL IN DEXTROSE 1-5 GM/200ML-% IV SOLN
1000.0000 mg | Freq: Once | INTRAVENOUS | Status: AC
  Administered 2018-09-08: 1000 mg via INTRAVENOUS
  Filled 2018-09-08: qty 200

## 2018-09-08 MED ORDER — INSULIN ASPART 100 UNIT/ML ~~LOC~~ SOLN
4.0000 [IU] | Freq: Three times a day (TID) | SUBCUTANEOUS | Status: DC
  Administered 2018-09-08 – 2018-09-11 (×9): 4 [IU] via SUBCUTANEOUS

## 2018-09-08 MED ORDER — ONDANSETRON HCL 4 MG/2ML IJ SOLN
4.0000 mg | Freq: Four times a day (QID) | INTRAMUSCULAR | Status: DC | PRN
  Administered 2018-09-08 – 2018-09-10 (×4): 4 mg via INTRAVENOUS
  Filled 2018-09-08 (×5): qty 2

## 2018-09-08 MED ORDER — AMLODIPINE BESYLATE 5 MG PO TABS
5.0000 mg | ORAL_TABLET | Freq: Every day | ORAL | Status: DC
  Administered 2018-09-08 – 2018-09-11 (×3): 5 mg via ORAL
  Filled 2018-09-08 (×3): qty 1

## 2018-09-08 MED ORDER — OXYCODONE-ACETAMINOPHEN 5-325 MG PO TABS
1.0000 | ORAL_TABLET | Freq: Four times a day (QID) | ORAL | Status: DC | PRN
  Administered 2018-09-08 – 2018-09-10 (×7): 2 via ORAL
  Filled 2018-09-08 (×8): qty 2

## 2018-09-08 MED ORDER — ONDANSETRON HCL 4 MG PO TABS
4.0000 mg | ORAL_TABLET | Freq: Once | ORAL | Status: AC
  Administered 2018-09-08: 11:00:00 4 mg via ORAL
  Filled 2018-09-08: qty 1

## 2018-09-08 NOTE — H&P (Signed)
History and Physical  Thomas Garcia CZY:[id number] DOB: [birth year] DOA: 09/08/2018  Referring physician: Meryl Crutch  PCP: Fayrene Helper, MD   Chief Complaint: left foot wound  HPI: Thomas Garcia is a 78 y.o. male longtime smoker with type 2 diabetes mellitus and a history of renal stones who presented to the emergency department with left foot pain.  Is been dealing with an infection in his left foot for the past 6 months.  He has not has sought medical treatment for this.  He reports that the injury initially started with first toe and then he developed excoriations around that toe that developed into an infection that spread to the other toes.  He says that eventually most of the left foot became involved.  Is been using topical treatments and reports that he is developed an ulcer recently between the third and fourth toes.  He reports that his symptoms continued to worsen and he developed chills and is unaware of fever.  His main complaint is the pain that he is been feeling in the left foot.  He has pain mostly in the toes of the left foot and the lower leg.  He is having drainage and a foul smell from the third toe of the left foot.  He continues to smoke cigarettes daily.  He reports that his blood sugars have been mostly less than 200.  The patient denies chest pain and shortness of breath.  ED course: The patient was evaluated in the ED and noted to have a foul-smelling purulent drainage from the left foot with areas of necrosis around the second and third toes.  He was noted to have weak pedal pulses on the feet.  His main complaint involve pain of the left foot.  He was noted to have an elevated white count of 20.0.  His blood sugar was 204.  His lactic acid was 1.3.  He was afebrile with a temperature of 98.3.  His blood pressure was 142/76.  His oxygen saturations 94% on room air.  X-ray of the left foot show soft tissue gas noted at the left third toe and findings consistent with  osteomyelitis.  The patient had blood cultures drawn and was started on empiric antibiotic coverage and hospital admission was requested.  Surgery was consulted who agreed to see the patient in consultation and recommended an MRI of the left foot and ABI studies.  Review of Systems: All systems reviewed and apart from history of presenting illness, are negative.  Past Medical History:  Diagnosis Date  . Diabetes mellitus without complication ([place])   . Kidney stones    Past Surgical History:  Procedure Laterality Date  . CYSTOSCOPY  05/08/2012   Procedure: CYSTOSCOPY;  Surgeon: Marissa Nestle, MD;  Location: [facility];  Service: Urology;  Laterality: N/A;  Evacuation of clots  . KIDNEY SURGERY     sutures   . SPLENECTOMY    . TRANSURETHRAL RESECTION OF PROSTATE  05/14/2012   Procedure: TRANSURETHRAL RESECTION OF THE PROSTATE (TURP);  Surgeon: Marissa Nestle, MD;  Location: [facility];  Service: Urology;  Laterality: N/A;   Social History:  reports that he has been smoking cigarettes. He has a 11.25 pack-year smoking history. He has never used smokeless tobacco. He reports that he does not drink alcohol or use drugs.  No Known Allergies  History reviewed. No pertinent family history.  Prior to Admission medications   Medication Sig Start Date End Date Taking? Authorizing Provider  naproxen sodium (ALEVE) 220 MG tablet Take 440 mg by mouth 2 (two) times daily as needed (pain).   Yes [provider]   Physical Exam: Vitals:   09/08/18 0941 09/08/18 0948 09/08/18 1230  BP:  (!) 172/83 (!) 149/66  Pulse:  97 87  Resp:  16 18  Temp:  98.1 F (36.7 C)   TempSrc:  Oral   SpO2:  97% 98%  Weight: 86.2 kg    Height: 5\' 9"  (1.753 m)       General exam: Moderately built and nourished patient, lying comfortably supine on the gurney in no obvious distress.  Head, eyes and ENT: Nontraumatic and normocephalic. Pupils equally reacting to light and accommodation. Oral mucosa moist.   Neck: Supple. No JVD, carotid bruit or thyromegaly.  Lymphatics: No lymphadenopathy.  Respiratory system: Clear to auscultation. No increased work of breathing.  Cardiovascular system: S1 and S2 heard, RRR. No JVD, murmurs, gallops, clicks or pedal edema.  Gastrointestinal system: Abdomen is nondistended, soft and nontender. Normal bowel sounds heard. No organomegaly or masses appreciated.  Central nervous system: Alert and oriented. No focal neurological deficits.  Extremities: Symmetric 5 x 5 power. Weak pedal pulses bilateral.  Cellulitis and necrotic changes seen in left foot with extensive involvement of 3rd toe.  Foot is warm.         Skin: severe erythema, warmth, purulent drainage left foot noted in picture above.  Musculoskeletal system: Negative exam.  Psychiatry: Pleasant and cooperative.  Labs on Admission:  Basic Metabolic Panel: Recent Labs  Lab 09/08/18 1124  NA 136  K 4.4  CL 98  CO2 25  GLUCOSE 204*  BUN 14  CREATININE 0.71  CALCIUM 8.9   Liver Function Tests: Recent Labs  Lab 09/08/18 1124  AST 11*  ALT 12  ALKPHOS 95  BILITOT 0.7  PROT 7.6  ALBUMIN 3.3*   No results for input(s): LIPASE, AMYLASE in the last 168 hours. No results for input(s): AMMONIA in the last 168 hours. CBC: Recent Labs  Lab 09/08/18 1124  WBC 20.0*  NEUTROABS 17.0*  HGB 13.6  HCT 42.1  MCV 91.5  PLT 392   Cardiac Enzymes: No results for input(s): CKTOTAL, CKMB, CKMBINDEX, TROPONINI in the last 168 hours.  BNP (last 3 results) No results for input(s): PROBNP in the last 8760 hours. CBG: Recent Labs  Lab 09/08/18 1026  GLUCAP 190*    Radiological Exams on Admission: Dg Chest 2 View  Result Date: 09/08/2018 CLINICAL DATA:  Suspected sepsis. Pt has open wounds to 3rd toe of Lt foot with redness and swelling to entire Lt foot. Pt states wounds have been present x several weeks. Pt c/o chills intermittently. Hx smoker, diabetes Suspected Sepsis EXAM: CHEST -  2 VIEW COMPARISON:  None. FINDINGS: Normal mediastinum and cardiac silhouette. Normal pulmonary vasculature. No evidence of effusion, infiltrate, or pneumothorax. No acute bony abnormality. IMPRESSION: No acute cardiopulmonary process. Electronically Signed   By: Suzy Bouchard M.D.   On: 09/08/2018 11:57   Dg Foot Complete Left  Result Date: 09/08/2018 CLINICAL DATA:  Third toe wound, initial encounter EXAM: LEFT FOOT - COMPLETE 3+ VIEW COMPARISON:  None. FINDINGS: Soft tissue gas is noted in the third toe near its base. Some bony resorption is noted in the distal aspect of the third proximal phalanx. IMPRESSION: Soft tissue infection involving the third toe with some resorption in the distal aspect of the third proximal phalanx consistent with osteomyelitis. Electronically Signed   By: Elta Guadeloupe  Lukens M.D.   On: 09/08/2018 11:49    EKG: Independently reviewed. 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 Cellulitis and osteomyelitis of the left foot- the patient is being sent for MRI of the left foot, ABI studies ordered as well.  The patient has been started on broad-spectrum empiric antibiotic therapy.  Blood cultures have been obtained and will follow.  Continue supportive therapy.  General surgery consultation requested.  Patient likely will need some operative management of this infection. 2. Type 2 diabetes mellitus with hyperglycemia- ordered for sliding scale coverage, prandial coverage and monitor CBG closely.  Adjust therapy as needed for better glycemic control.  Check hemoglobin A1c. 3. Leukocytosis-secondary to osteomyelitis and cellulitis noted above.  Follow CBC with differential daily. 4. Nicotine addiction- patient counseled on smoking cessation and  will offer nicotine patch while in hospital.   DVT Prophylaxis: heparin Code Status: Full   Family Communication: patient   Disposition Plan: inpatient med surge   Time spent: 30 minutes   Thomas Labrie Wynetta Emery, MD [facility] How to contact the [facility] Attending or Consulting provider [place] or covering provider during after hours [place], for this patient?  1. Check the care team in [facility] and look for a) attending/consulting [facility] provider listed and b) the [facility] team listed 2. Log into [email or web address] and use [place]'s universal password to access. If you do not have the password, please contact the hospital operator. 3. Locate the [facility] provider you are looking for under [facility] and page to a number that you can be directly reached. 4. If you still have difficulty reaching the provider, please page the [facility] (Director on Call) for the Hospitalists listed on amion for assistance.

## 2018-09-08 NOTE — Progress Notes (Signed)
09/08/2018 5:48 PM  I spoke with patient and son about the MRI and ABI results and patient is going to have a surgery consultation.   Murvin Natal MD

## 2018-09-08 NOTE — ED Provider Notes (Signed)
[facility] EMERGENCY DEPARTMENT Provider Note   CSN: [id number] Arrival date & time: 09/08/18  8182    History   Chief Complaint Chief Complaint  Patient presents with  . Foot Pain    HPI Thomas Garcia is a 78 y.o. male.     Patient is a 78 year old male who presents to the emergency department with a complaint of left foot pain.  The patient states he has had problems with his foot for nearly 6 months.  He said it started out with him losing the nail of his left first toe, and then a raw area occurring on that toe.  It then seemed as though the raw areas went from one toe to the next until the entire foot on the left foot was involved.  He later noted an ulcer in between the third and fourth toe.  He applied over-the-counter medications for it, but these were not improving.  He says that he has had chills.  He is not aware of having had any fever.  He has increasing pain in his left foot.  The pain is from the toes to the lower tibial area.  Patient has drainage from the third toe and also from the area under the toes on the left foot.  It is of note the patient is a type II diabetic, and he smokes daily.  The history is provided by the patient.  Foot Pain  Pertinent negatives include no chest pain, no abdominal pain and no shortness of breath.    Past Medical History:  Diagnosis Date  . Diabetes mellitus without complication ([place])   . Kidney stones     Patient Active Problem List   Diagnosis Date Noted  . MOLE 10/26/2008  . DIABETES MELLITUS, TYPE II 10/26/2008  . OVERWEIGHT 10/26/2008  . NICOTINE ADDICTION 10/26/2008  . ACUTE CYSTITIS 10/26/2008  . FATIGUE 10/26/2008  . COUGH 10/26/2008  . ELECTROCARDIOGRAM, ABNORMAL 10/26/2008    Past Surgical History:  Procedure Laterality Date  . CYSTOSCOPY  05/08/2012   Procedure: CYSTOSCOPY;  Surgeon: Marissa Nestle, MD;  Location: [facility];  Service: Urology;  Laterality: N/A;  Evacuation of clots  . KIDNEY SURGERY     sutures   . SPLENECTOMY    . TRANSURETHRAL RESECTION OF PROSTATE  05/14/2012   Procedure: TRANSURETHRAL RESECTION OF THE PROSTATE (TURP);  Surgeon: Marissa Nestle, MD;  Location: [facility];  Service: Urology;  Laterality: N/A;        Home Medications    Prior to Admission medications   Medication Sig Start Date End Date Taking? Authorizing Provider  naproxen sodium (ALEVE) 220 MG tablet Take 440 mg by mouth 2 (two) times daily as needed (pain).   Yes [provider]    Family History No family history on file.  Social History Social History   Tobacco Use  . Smoking status: Current Every Day Smoker    Packs/day: 0.25    Years: 45.00    Pack years: 11.25    Types: Cigarettes  . Smokeless tobacco: Never Used  Substance Use Topics  . Alcohol use: No  . Drug use: No     Allergies   Patient has no known allergies.   Review of Systems Review of Systems  Constitutional: Positive for chills. Negative for activity change and fever.       All ROS Neg except as noted in HPI  HENT: Negative for nosebleeds.   Eyes: Negative for photophobia and discharge.  Respiratory:  Negative for cough, shortness of breath and wheezing.   Cardiovascular: Negative for chest pain and palpitations.  Gastrointestinal: Negative for abdominal pain and blood in stool.  Genitourinary: Negative for dysuria, frequency and hematuria.  Musculoskeletal: Negative for arthralgias, back pain and neck pain.       Foot pain  Skin: Negative.   Neurological: Negative for dizziness, seizures and speech difficulty.  Psychiatric/Behavioral: Negative for confusion and hallucinations.     Physical Exam Updated Vital Signs BP (!) 172/83 (BP Location: Left Arm)   Pulse 97   Temp 98.1 F (36.7 C) (Oral)   Resp 16   Ht 5\' 9"  (1.753 m)   Wt 86.2 kg   SpO2 97%   BMI 28.06 kg/m   Physical Exam Vitals signs and nursing note reviewed.  Constitutional:      Appearance: He is well-developed. He is not  toxic-appearing.  HENT:     Head: Normocephalic.     Right Ear: Tympanic membrane and external ear normal.     Left Ear: Tympanic membrane and external ear normal.  Eyes:     General: Lids are normal.     Pupils: Pupils are equal, round, and reactive to light.  Neck:     Musculoskeletal: Normal range of motion and neck supple.     Vascular: No carotid bruit.  Cardiovascular:     Rate and Rhythm: Normal rate and regular rhythm.     Pulses: Normal pulses.     Heart sounds: Normal heart sounds.  Pulmonary:     Effort: No respiratory distress.     Breath sounds: Normal breath sounds.  Abdominal:     General: Bowel sounds are normal.     Palpations: Abdomen is soft.     Tenderness: There is no abdominal tenderness. There is no guarding.  Musculoskeletal:        General: Swelling and tenderness present.     Comments: There is increased redness and swelling of the left foot.  There are multiple scabbed areas of the dorsum of the toes of the left foot.  There are cracked skin areas between the toes of the left foot.  There is a deep ulcer with black tissue in the webspace between the third and fourth toe.  There is purulent drainage present.  There is a purulent filled blister on the plantar surface from the second to the fourth toe.  There are no puncture wounds to the plantar surface of the left foot.  The dorsalis pedis is 1+ on the left.  The foot is warm, but not hot.  There are palpable inguinal nodes on the left.  Lymphadenopathy:     Head:     Right side of head: No submandibular adenopathy.     Left side of head: No submandibular adenopathy.     Cervical: No cervical adenopathy.  Skin:    General: Skin is warm and dry.  Neurological:     Mental Status: He is alert and oriented to person, place, and time.     Cranial Nerves: No cranial nerve deficit.     Sensory: No sensory deficit.  Psychiatric:        Speech: Speech normal.            ED Treatments / Results  Labs  (all labs ordered are listed, but only abnormal results are displayed) Labs Reviewed  CBG MONITORING, ED - Abnormal; Notable for the following components:      Result Value   Glucose-Capillary 190 (*)  All other components within normal limits  CULTURE, BLOOD (ROUTINE X 2)  CULTURE, BLOOD (ROUTINE X 2)  AEROBIC CULTURE (SUPERFICIAL SPECIMEN)  COMPREHENSIVE METABOLIC PANEL  LACTIC ACID, PLASMA  LACTIC ACID, PLASMA  CBC WITH DIFFERENTIAL/PLATELET  PROTIME-INR  URINALYSIS, ROUTINE W REFLEX MICROSCOPIC  CBG MONITORING, ED    EKG EKG Interpretation  Date/Time:  Monday September 08 2018 10:23:53 EDT Ventricular Rate:  94 PR Interval:    QRS Duration: 107 QT Interval:  401 QTC Calculation: 502 R Axis:   -58 Text Interpretation:  Sinus rhythm Left anterior fascicular block Abnormal R-wave progression, early transition Prolonged QT interval No significant change since last tracing Artifact Confirmed by Fredia Sorrow [phone]) on 09/08/2018 10:32:20 AM   Radiology No results found.  Procedures Procedures (including critical care time)  Medications Ordered in ED Medications  HYDROcodone-acetaminophen (NORCO/VICODIN) 5-325 MG per tablet 1 tablet (1 tablet Oral Given 09/08/18 1121)  ondansetron (ZOFRAN) tablet 4 mg (4 mg Oral Given 09/08/18 1121)     Initial Impression / Assessment and Plan / ED Course  I have reviewed the triage vital signs and the nursing notes.  Pertinent labs & imaging results that were available during my care of the patient were reviewed by me and considered in my medical decision making (see chart for details).          Final Clinical Impressions(s) / ED Diagnoses MDM  Blood pressure is elevated at 172/83, otherwise vital signs within normal limits.  Pulse oximetry is 97% on room air.  Within normal limits by my interpretation.  The patient has increased redness and swelling of the left foot.  There is an ulcer with black tissue material around it with  purulent drainage present.  I am concerned for possible septic foot.  Blood cultures and culture of the wound of the third toe have been obtained.  X-ray of the foot is pending.  CBG is 190.  Comprehensive metabolic panel shows a glucose to be elevated at 204.  The remainder of the comprehensive metabolic panel is well within normal limits.  The anion gap is normal at 13.  The lactic acid is normal at 1.3.  Chest x-ray shows no acute cardiopulmonary process. Soft tissue infection noted on the left foot x-ray particularly involving the third toe.  There is some resorption in the distal aspect of the third proximal phalanx, consistent with osteomyelitis.  Patient started on Zosyn and vancomycin.  Consult placed to [facility] hospitalist for admission. Consult placed to Gen. Surg. Hospitalist will admit.   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

## 2018-09-08 NOTE — ED Triage Notes (Signed)
Pt reports wound to left foot x 6 months.  Pt says hasn't seen his doctor for it yet.  Pt has foot wrapped.  Foot appears swollen.

## 2018-09-08 NOTE — Consult Note (Signed)
[facility] [facility] Nurse wound consult note Patient receiving care in [facility] ED 04. Reason for Consult: LE wound Wound type: chronic foot wound with evidence of osteomyelitis Per the Xray report of the left foot from today:  " FINDINGS: Soft tissue gas is noted in the third toe near its base. Some bony resorption is noted in the distal aspect of the third proximal phalanx. IMPRESSION:Soft tissue infection involving the third toe with some resorption in the distal aspect of the third proximal phalanx consistent with Osteomyelitis. Electronically Signed   By: Inez Catalina M.D.   On: 09/08/2018 11:49" Care of osteomyelitis exceeds the scope of practice of the [place] nurse. Please consult surgery for their participation in the patient care.  Val Riles, RN, MSN, CWOCN, CNS-BC, pager [phone]

## 2018-09-08 NOTE — ED Notes (Signed)
Pt's son, Ronalee Belts, can be reached [phone] or [phone].

## 2018-09-09 ENCOUNTER — Inpatient Hospital Stay (HOSPITAL_COMMUNITY): Payer: Medicare Other

## 2018-09-09 DIAGNOSIS — I96 Gangrene, not elsewhere classified: Secondary | ICD-10-CM

## 2018-09-09 DIAGNOSIS — Z01818 Encounter for other preprocedural examination: Secondary | ICD-10-CM

## 2018-09-09 DIAGNOSIS — I739 Peripheral vascular disease, unspecified: Secondary | ICD-10-CM

## 2018-09-09 LAB — BASIC METABOLIC PANEL
Anion gap: 12 (ref 5–15)
BUN: 17 mg/dL (ref 8–23)
CO2: 23 mmol/L (ref 22–32)
Calcium: 8.3 mg/dL — ABNORMAL LOW (ref 8.9–10.3)
Chloride: 101 mmol/L (ref 98–111)
Creatinine, Ser: 0.74 mg/dL (ref 0.61–1.24)
GFR calc Af Amer: >60 mL/min (ref >60)
GFR calc non Af Amer: >60 mL/min (ref >60)
Glucose, Bld: 153 mg/dL — ABNORMAL HIGH (ref 70–99)
Potassium: 3.9 mmol/L (ref 3.5–5.1)
Sodium: 136 mmol/L (ref 135–145)

## 2018-09-09 LAB — HIV ANTIBODY (ROUTINE TESTING W REFLEX): HIV Screen 4th Generation wRfx: NONREACTIVE

## 2018-09-09 LAB — CBC WITH DIFFERENTIAL/PLATELET
Abs Immature Granulocytes: 0.06 10*3/uL (ref 0.00–0.07)
Basophils Absolute: 0.1 10*3/uL (ref 0.0–0.1)
Basophils Relative: 0 %
Eosinophils Absolute: 0.1 10*3/uL (ref 0.0–0.5)
Eosinophils Relative: 0 %
HCT: 37.5 % — ABNORMAL LOW (ref 39.0–52.0)
Hemoglobin: 12.3 g/dL — ABNORMAL LOW (ref 13.0–17.0)
Immature Granulocytes: 0 %
Lymphocytes Relative: 11 %
Lymphs Abs: 2 10*3/uL (ref 0.7–4.0)
MCH: 29.6 pg (ref 26.0–34.0)
MCHC: 32.8 g/dL (ref 30.0–36.0)
MCV: 90.1 fL (ref 80.0–100.0)
Monocytes Absolute: 1 10*3/uL (ref 0.1–1.0)
Monocytes Relative: 6 %
Neutro Abs: 14 10*3/uL — ABNORMAL HIGH (ref 1.7–7.7)
Neutrophils Relative %: 83 %
Platelets: 415 10*3/uL — ABNORMAL HIGH (ref 150–400)
RBC: 4.16 MIL/uL — ABNORMAL LOW (ref 4.22–5.81)
RDW: 13.3 % (ref 11.5–15.5)
WBC: 17.2 10*3/uL — ABNORMAL HIGH (ref 4.0–10.5)
nRBC: 0 % (ref 0.0–0.2)

## 2018-09-09 LAB — GLUCOSE, CAPILLARY
Glucose-Capillary: 178 mg/dL — ABNORMAL HIGH (ref 70–99)
Glucose-Capillary: 132 mg/dL — ABNORMAL HIGH (ref 70–99)
Glucose-Capillary: 114 mg/dL — ABNORMAL HIGH (ref 70–99)
Glucose-Capillary: 134 mg/dL — ABNORMAL HIGH (ref 70–99)
Glucose-Capillary: 125 mg/dL — ABNORMAL HIGH (ref 70–99)

## 2018-09-09 LAB — PREALBUMIN: Prealbumin: 8.7 mg/dL — ABNORMAL LOW (ref 18–38)

## 2018-09-09 LAB — ECHOCARDIOGRAM COMPLETE
Height: 69 in
Weight: 3065.28 oz

## 2018-09-09 LAB — SURGICAL PCR SCREEN
MRSA, PCR: NEGATIVE
Staphylococcus aureus: NEGATIVE

## 2018-09-09 MED ORDER — NICOTINE 21 MG/24HR TD PT24
21.0000 mg | MEDICATED_PATCH | Freq: Every day | TRANSDERMAL | Status: DC
  Administered 2018-09-09 – 2018-09-11 (×2): 21 mg via TRANSDERMAL
  Filled 2018-09-09 (×2): qty 1

## 2018-09-09 MED ORDER — GLUCERNA SHAKE PO LIQD
237.0000 mL | Freq: Two times a day (BID) | ORAL | Status: DC
  Administered 2018-09-09 – 2018-09-11 (×4): 237 mL via ORAL

## 2018-09-09 MED ORDER — ADULT MULTIVITAMIN W/MINERALS CH
1.0000 | ORAL_TABLET | Freq: Every day | ORAL | Status: DC
  Administered 2018-09-09 – 2018-09-11 (×2): 1 via ORAL
  Filled 2018-09-09 (×2): qty 1

## 2018-09-09 MED ORDER — CEFAZOLIN SODIUM-DEXTROSE 2-4 GM/100ML-% IV SOLN
2.0000 g | INTRAVENOUS | Status: AC
  Administered 2018-09-10: 2 g via INTRAVENOUS
  Filled 2018-09-09: qty 100

## 2018-09-09 MED ORDER — LIVING WELL WITH DIABETES BOOK
Freq: Once | Status: AC
  Administered 2018-09-09: 13:00:00

## 2018-09-09 MED ORDER — CHLORHEXIDINE GLUCONATE CLOTH 2 % EX PADS
6.0000 | MEDICATED_PAD | Freq: Once | CUTANEOUS | Status: AC
  Administered 2018-09-09: 22:00:00 6 via TOPICAL

## 2018-09-09 MED ORDER — MUPIROCIN 2 % EX OINT
1.0000 "application " | TOPICAL_OINTMENT | Freq: Two times a day (BID) | CUTANEOUS | Status: DC
  Administered 2018-09-09 – 2018-09-11 (×3): 1 via NASAL
  Filled 2018-09-09: qty 22

## 2018-09-09 MED ORDER — SODIUM CHLORIDE 0.9 % IV SOLN
INTRAVENOUS | Status: DC | PRN
  Administered 2018-09-09: 18:00:00 1000 mL via INTRAVENOUS
  Administered 2018-09-10: 10:00:00 via INTRAVENOUS

## 2018-09-09 MED ORDER — CHLORHEXIDINE GLUCONATE CLOTH 2 % EX PADS: 6.0000 | MEDICATED_PAD | Freq: Once | CUTANEOUS | Status: DC

## 2018-09-09 MED ORDER — INSULIN STARTER KIT- PEN NEEDLES (ENGLISH)
1.0000 | Freq: Once | Status: AC
  Administered 2018-09-09: 1
  Filled 2018-09-09: qty 1

## 2018-09-09 MED ORDER — PRO-STAT SUGAR FREE PO LIQD
30.0000 mL | Freq: Two times a day (BID) | ORAL | Status: DC
  Administered 2018-09-09 – 2018-09-11 (×3): 30 mL via ORAL
  Filled 2018-09-09 (×3): qty 30

## 2018-09-09 MED ORDER — LISINOPRIL 5 MG PO TABS
5.0000 mg | ORAL_TABLET | Freq: Every day | ORAL | Status: DC
  Administered 2018-09-09 – 2018-09-11 (×2): 5 mg via ORAL
  Filled 2018-09-09 (×2): qty 1

## 2018-09-09 NOTE — Progress Notes (Signed)
*  PRELIMINARY RESULTS* Echocardiogram 2D Echocardiogram has been performed by Gerald Stabs RDCS.  Samuel Germany 09/09/2018, 10:29 AM

## 2018-09-09 NOTE — Consult Note (Signed)
[facility] Consult  Reason for Consult: Left 3rd toe osteomyelitis, cellulitis  Referring Physician:  Dr. Johnson, MD   Chief Complaint    Foot Pain      Thomas Garcia is a 78 y.o. male.  HPI:  Thomas Garcia is a very sweet 78 yo who does not seek out medical attention often and came to the ED with foot pain on the left with swelling that he says has been present for at least 6 months. He has diabetes that he knows of but has been on and off medications in the past and was not on medications currently to treat.  He says that he has noticed that the toes on his left foot have started to lose the toenails and that he does not recall any ulcers or blisters until this recent ulcer on his 3rd toe on the left. He says that the foot has been swollen for some time. He had tried to treat the area with some topical ointments but has not sought out any medical treatment.  He says that he has never had any heart attacks or strokes that he knows of but that he did have a time when he felt "someone pulls something down in his chest."  He is able to drive and lives alone. He has sons that are available if he needs them or in an emergency. He says he drives about 2 miles to the local store but this about the limit to his outings.   Past Medical History:  Diagnosis Date  . Diabetes mellitus without complication (HCC)   . Kidney stones     Past Surgical History:  Procedure Laterality Date  . CYSTOSCOPY  05/08/2012   Procedure: CYSTOSCOPY;  Surgeon: Mohammad I Javaid, MD;  Location: [facility];  Service: Urology;  Laterality: N/A;  Evacuation of clots  . KIDNEY SURGERY     sutures   . SPLENECTOMY    . TRANSURETHRAL RESECTION OF PROSTATE  05/14/2012   Procedure: TRANSURETHRAL RESECTION OF THE PROSTATE (TURP);  Surgeon: Mohammad I Javaid, MD;  Location: [facility];  Service: Urology;  Laterality: N/A;    History reviewed. No pertinent family history.  Social History   Tobacco Use  . Smoking  status: Current Every Day Smoker    Packs/day: 0.25    Years: 45.00    Pack years: 11.25    Types: Cigarettes  . Smokeless tobacco: Never Used  Substance Use Topics  . Alcohol use: No  . Drug use: No    Medications:  I have reviewed the patient's current medications. Prior to Admission:  Medications Prior to Admission  Medication Sig Dispense Refill Last Dose  . naproxen sodium (ALEVE) 220 MG tablet Take 440 mg by mouth 2 (two) times daily as needed (pain).   09/08/2018 at Unknown time   Scheduled: . amLODipine  5 mg Oral Daily  . heparin injection (subcutaneous)  5,000 Units Subcutaneous Q8H  . insulin aspart  0-15 Units Subcutaneous TID WC  . insulin aspart  0-5 Units Subcutaneous QHS  . insulin aspart  4 Units Subcutaneous TID WC  . insulin glargine  10 Units Subcutaneous QHS  . insulin starter kit- pen needles  1 kit Other Once  . living well with diabetes book   Does not apply Once   Continuous: . cefTRIAXone (ROCEPHIN)  IV 200 mL/hr at 09/08/18 1720   And  . metronidazole 500 mg (09/09/18 0809)   PRN:hydrALAZINE, ondansetron (ZOFRAN) IV, oxyCODONE-acetaminophen  No Known   Allergies  ROS:  A comprehensive review of systems was negative except for: Musculoskeletal: positive for left foot swelling and pain, drainage from the 3rd toe  Blood pressure (!) 151/70, pulse 89, temperature 98.1 F (36.7 C), temperature source Oral, resp. rate 16, height 5' 9" (1.753 m), weight 86.9 kg, SpO2 99 %. Physical Exam Vitals signs reviewed.  Constitutional:      Appearance: Normal appearance.  HENT:     Head: Normocephalic.     Nose: Nose normal.     Mouth/Throat:     Mouth: Mucous membranes are moist.  Eyes:     Pupils: Pupils are equal, round, and reactive to light.  Neck:     Musculoskeletal: Normal range of motion.  Cardiovascular:     Rate and Rhythm: Normal rate.     Pulses:          Popliteal pulses are 1+ on the right side and 1+ on the left side.       Dorsalis  pedis pulses are 0 on the right side and 0 on the left side.       Posterior tibial pulses are 0 on the right side and 0 on the left side.  Pulmonary:     Effort: Pulmonary effort is normal.  Abdominal:     General: There is no distension.     Palpations: Abdomen is soft.     Tenderness: There is no abdominal tenderness.  Musculoskeletal:     Right lower leg: No edema.     Left lower leg: Edema present.     Comments: Left foot swelling and erythema to the mid foot, 3rd toe with wet gangrene and drainage, 2nd toe with some ulcerations, no toenails on the 1st, 2nd toe, small attenuated nails on the remaining toes, 4th digit with some superficial ulcerations but no drainage, and 5th digit without ulcers or drainage  Skin:    General: Skin is warm.  Neurological:     General: No focal deficit present.     Mental Status: He is alert and oriented to person, place, and time.  Psychiatric:        Mood and Affect: Mood normal.        Behavior: Behavior normal.        Thought Content: Thought content normal.        Judgment: Judgment normal.       Results: Results for orders placed or performed during the hospital encounter of 09/08/18 (from the past 48 hour(s))  CBG monitoring, ED     Status: Abnormal   Collection Time: 09/08/18 10:26 AM  Result Value Ref Range   Glucose-Capillary 190 (H) 70 - 99 mg/dL   Comment 1 Notify RN   Urinalysis, Routine w reflex microscopic     Status: Abnormal   Collection Time: 09/08/18 10:37 AM  Result Value Ref Range   Color, Urine YELLOW YELLOW   APPearance CLEAR CLEAR   Specific Gravity, Urine 1.014 1.005 - 1.030   pH 6.0 5.0 - 8.0   Glucose, UA >=500 (A) NEGATIVE mg/dL   Hgb urine dipstick SMALL (A) NEGATIVE   Bilirubin Urine NEGATIVE NEGATIVE   Ketones, ur 5 (A) NEGATIVE mg/dL   Protein, ur 30 (A) NEGATIVE mg/dL   Nitrite NEGATIVE NEGATIVE   Leukocytes,Ua NEGATIVE NEGATIVE   RBC / HPF 21-50 0 - 5 RBC/hpf   WBC, UA 0-5 0 - 5 WBC/hpf    Bacteria, UA RARE (A) NONE SEEN   Mucus PRESENT      Hyaline Casts, UA PRESENT     Comment: Performed at [facility], [address] [place], [place] [address]  Wound or Superficial Culture     Status: None (Preliminary result)   Collection Time: 09/08/18 11:13 AM  Result Value Ref Range   Specimen Description      FOOT TOE Performed at [facility], [address] [place], [place] [address]    Special Requests      NONE Performed at [facility], [address] [place], [place] [address]    Gram Stain      MODERATE WBC PRESENT,BOTH PMN AND MONONUCLEAR ABUNDANT GRAM NEGATIVE RODS ABUNDANT GRAM POSITIVE COCCI    Culture      ABUNDANT GRAM NEGATIVE RODS CULTURE REINCUBATED FOR BETTER GROWTH Performed at [facility], [address] [place], [place] [address]    Report Status PENDING   Comprehensive metabolic panel     Status: Abnormal   Collection Time: 09/08/18 11:24 AM  Result Value Ref Range   Sodium 136 135 - 145 mmol/L   Potassium 4.4 3.5 - 5.1 mmol/L   Chloride 98 98 - 111 mmol/L   CO2 25 22 - 32 mmol/L   Glucose, Bld 204 (H) 70 - 99 mg/dL   BUN 14 8 - 23 mg/dL   Creatinine, Ser 0.71 0.61 - 1.24 mg/dL   Calcium 8.9 8.9 - 10.3 mg/dL   Total Protein 7.6 6.5 - 8.1 g/dL   Albumin 3.3 (L) 3.5 - 5.0 g/dL   AST 11 (L) 15 - 41 U/L   ALT 12 0 - 44 U/L   Alkaline Phosphatase 95 38 - 126 U/L   Total Bilirubin 0.7 0.3 - 1.2 mg/dL   GFR calc non Af Amer >60 >60 mL/min   GFR calc Af Amer >60 >60 mL/min   Anion gap 13 5 - 15    Comment: Performed at [facility], [address] [place], [place] [address]  Lactic acid, plasma     Status: None   Collection Time: 09/08/18 11:24 AM  Result Value Ref Range   Lactic Acid, Venous 1.3 0.5 - 1.9 mmol/L    Comment: Performed at [facility], [address] [place], [place] [address]  CBC with Differential     Status: Abnormal   Collection Time: 09/08/18 11:24 AM  Result Value Ref Range   WBC 20.0 (H) 4.0 - 10.5 K/uL   RBC  4.60 4.22 - 5.81 MIL/uL   Hemoglobin 13.6 13.0 - 17.0 g/dL   HCT 42.1 39.0 - 52.0 %   MCV 91.5 80.0 - 100.0 fL   MCH 29.6 26.0 - 34.0 pg   MCHC 32.3 30.0 - 36.0 g/dL   RDW 13.5 11.5 - 15.5 %   Platelets 392 150 - 400 K/uL    Comment: PLATELET COUNT CONFIRMED BY SMEAR SPECIMEN CHECKED FOR CLOTS    nRBC 0.0 0.0 - 0.2 %   Neutrophils Relative % 85 %   Neutro Abs 17.0 (H) 1.7 - 7.7 K/uL   Lymphocytes Relative 7 %   Lymphs Abs 1.4 0.7 - 4.0 K/uL   Monocytes Relative 7 %   Monocytes Absolute 1.5 (H) 0.1 - 1.0 K/uL   Eosinophils Relative 0 %   Eosinophils Absolute 0.0 0.0 - 0.5 K/uL   Basophils Relative 1 %   Basophils Absolute 0.1 0.0 - 0.1 K/uL   Immature Granulocytes 0 %   Abs Immature Granulocytes 0.05 0.00 - 0.07 K/uL    Comment: Performed at   [facility], [address] [place], [place] [address]  Protime-INR     Status: None   Collection Time: 09/08/18 11:24 AM  Result Value Ref Range   Prothrombin Time 13.7 11.4 - 15.2 seconds   INR 1.1 0.8 - 1.2    Comment: (NOTE) INR goal varies based on device and disease states. Performed at [facility], [address], [place], [place] [address]   Sedimentation rate     Status: Abnormal   Collection Time: 09/08/18 11:24 AM  Result Value Ref Range   Sed Rate 72 (H) 0 - 16 mm/hr    Comment: Performed at [facility], [address] [place], [place] [address]  Hemoglobin A1c     Status: Abnormal   Collection Time: 09/08/18 11:24 AM  Result Value Ref Range   Hgb A1c MFr Bld 9.0 (H) 4.8 - 5.6 %    Comment: (NOTE) Pre diabetes:          5.7%-6.4% Diabetes:              >6.4% Glycemic control for   <7.0% adults with diabetes    Mean Plasma Glucose 211.6 mg/dL    Comment: Performed at [facility] [address], [place], [place] [address]  Culture, blood (Routine x 2)     Status: None (Preliminary result)   Collection Time: 09/08/18 11:25 AM  Result Value Ref Range   Specimen Description RIGHT ANTECUBITAL    Special  Requests      BOTTLES DRAWN AEROBIC AND ANAEROBIC Blood Culture adequate volume   Culture      NO GROWTH < 24 HOURS Performed at [facility], [address], [place], [place] [address]    Report Status PENDING   Culture, blood (Routine x 2)     Status: None (Preliminary result)   Collection Time: 09/08/18 11:25 AM  Result Value Ref Range   Specimen Description LEFT ANTECUBITAL    Special Requests      BOTTLES DRAWN AEROBIC AND ANAEROBIC Blood Culture adequate volume   Culture      NO GROWTH < 24 HOURS Performed at [facility], [address] [place], [place] [address]    Report Status PENDING   SARS Coronavirus 2 (CEPHEID - Performed in [place] hospital lab), Hosp Order     Status: None   Collection Time: 09/08/18 11:48 AM  Result Value Ref Range   SARS Coronavirus 2 NEGATIVE NEGATIVE    Comment: (NOTE) If result is NEGATIVE SARS-CoV-2 target nucleic acids are NOT DETECTED. The SARS-CoV-2 RNA is generally detectable in upper and lower  respiratory specimens during the acute phase of infection. The lowest  concentration of SARS-CoV-2 viral copies this assay can detect is 250  copies / mL. A negative result does not preclude SARS-CoV-2 infection  and should not be used as the sole basis for treatment or other  patient management decisions.  A negative result may occur with  improper specimen collection / handling, submission of specimen other  than nasopharyngeal swab, presence of viral mutation(s) within the  areas targeted by this assay, and inadequate number of viral copies  (<250 copies / mL). A negative result must be combined with clinical  observations, patient history, and epidemiological information. If result is POSITIVE SARS-CoV-2 target nucleic acids are DETECTED. The SARS-CoV-2 RNA is generally detectable in upper and lower  respiratory specimens dur ing the acute phase of infection.  Positive  results are indicative of active infection with SARS-CoV-2.  Clinical  correlation with patient history and other diagnostic information is  necessary to determine patient infection status.  Positive results do  not rule out bacterial infection or co-infection with other viruses. If result is PRESUMPTIVE POSTIVE SARS-CoV-2 nucleic acids MAY BE PRESENT.   A presumptive positive result was obtained on the submitted specimen  and confirmed on repeat testing.  While 2019 novel coronavirus  (SARS-CoV-2) nucleic acids may be present in the submitted sample  additional confirmatory testing may be necessary for epidemiological  and / or clinical management purposes  to differentiate between  SARS-CoV-2 and other Sarbecovirus currently known to infect humans.  If clinically indicated additional testing with an alternate test  methodology ([id number]) is advised. The SARS-CoV-2 RNA is generally  detectable in upper and lower respiratory sp ecimens during the acute  phase of infection. The expected result is Negative. Fact Sheet for Patients:  https://www.fda.gov/media/136312/download Fact Sheet for Healthcare Providers: https://www.fda.gov/media/136313/download This test is not yet approved or cleared by the [place] FDA and has been authorized for detection and/or diagnosis of SARS-CoV-2 by FDA under an Emergency Use Authorization (EUA).  This EUA will remain in effect (meaning this test can be used) for the duration of the COVID-19 declaration under Section 564(b)(1) of the Act, 21 U.S.C. section 360bbb-3(b)(1), unless the authorization is terminated or revoked sooner. Performed at [facility], [address] [place], [place] [address]   Lactic acid, plasma     Status: None   Collection Time: 09/08/18  2:12 PM  Result Value Ref Range   Lactic Acid, Venous 1.3 0.5 - 1.9 mmol/L    Comment: Performed at [facility], [address] [place], [place] [address]  Glucose, capillary     Status: Abnormal   Collection Time: 09/08/18  5:04 PM  Result  Value Ref Range   Glucose-Capillary 139 (H) 70 - 99 mg/dL  C-reactive protein     Status: Abnormal   Collection Time: 09/08/18  5:44 PM  Result Value Ref Range   CRP 16.0 (H) <1.0 mg/dL    Comment: Performed at [facility], [address] [place], [place] [address]  Prealbumin     Status: Abnormal   Collection Time: 09/08/18  5:44 PM  Result Value Ref Range   Prealbumin 8.7 (L) 18 - 38 mg/dL    Comment: Performed at [facility], [address] [place], [place] [address]  Glucose, capillary     Status: Abnormal   Collection Time: 09/08/18  9:07 PM  Result Value Ref Range   Glucose-Capillary 127 (H) 70 - 99 mg/dL  Glucose, capillary     Status: Abnormal   Collection Time: 09/09/18  3:02 AM  Result Value Ref Range   Glucose-Capillary 178 (H) 70 - 99 mg/dL  Basic metabolic panel     Status: Abnormal   Collection Time: 09/09/18  6:06 AM  Result Value Ref Range   Sodium 136 135 - 145 mmol/L   Potassium 3.9 3.5 - 5.1 mmol/L   Chloride 101 98 - 111 mmol/L   CO2 23 22 - 32 mmol/L   Glucose, Bld 153 (H) 70 - 99 mg/dL   BUN 17 8 - 23 mg/dL   Creatinine, Ser 0.74 0.61 - 1.24 mg/dL   Calcium 8.3 (L) 8.9 - 10.3 mg/dL   GFR calc non Af Amer >60 >60 mL/min   GFR calc Af Amer >60 >60 mL/min   Anion gap 12 5 - 15    Comment: Performed at [facility], [address] [place], [place] [address]  CBC with Differential     Status: Abnormal   Collection Time: 09/09/18  6:06 AM  Result Value Ref Range   WBC 17.2 (H) 4.0 - 10.5 K/uL   RBC 4.16 (L) 4.22 - 5.81 MIL/uL   Hemoglobin 12.3 (L) 13.0 - 17.0 g/dL   HCT 37.5 (L) 39.0 - 52.0 %   MCV 90.1 80.0 - 100.0 fL   MCH 29.6 26.0 - 34.0 pg   MCHC 32.8 30.0 - 36.0 g/dL   RDW 13.3 11.5 - 15.5 %   Platelets 415 (H) 150 - 400 K/uL   nRBC 0.0 0.0 - 0.2 %   Neutrophils Relative % 83 %   Neutro Abs 14.0 (H) 1.7 - 7.7 K/uL   Lymphocytes Relative 11 %   Lymphs Abs 2.0 0.7 - 4.0 K/uL   Monocytes Relative 6 %   Monocytes Absolute 1.0 0.1 - 1.0  K/uL   Eosinophils Relative 0 %   Eosinophils Absolute 0.1 0.0 - 0.5 K/uL   Basophils Relative 0 %   Basophils Absolute 0.1 0.0 - 0.1 K/uL   Immature Granulocytes 0 %   Abs Immature Granulocytes 0.06 0.00 - 0.07 K/uL    Comment: Performed at [facility], [address] [place], [place] [address]  Glucose, capillary     Status: Abnormal   Collection Time: 09/09/18  7:19 AM  Result Value Ref Range   Glucose-Capillary 132 (H) 70 - 99 mg/dL   Personally reviewed imaging- concern for osteo of the 3rd toe on left, no abscess  Dg Chest 2 View  Result Date: 09/08/2018 CLINICAL DATA:  Suspected sepsis. Pt has open wounds to 3rd toe of Lt foot with redness and swelling to entire Lt foot. Pt states wounds have been present x several weeks. Pt c/o chills intermittently. Hx smoker, diabetes Suspected Sepsis EXAM: CHEST - 2 VIEW COMPARISON:  None. FINDINGS: Normal mediastinum and cardiac silhouette. Normal pulmonary vasculature. No evidence of effusion, infiltrate, or pneumothorax. No acute bony abnormality. IMPRESSION: No acute cardiopulmonary process. Electronically Signed   By: Stewart  Edmunds M.D.   On: 09/08/2018 11:57   Mri Left Foot Without Contrast  Result Date: 09/08/2018 CLINICAL DATA:  Left foot wound with swelling for 6 months. Diabetes. Osteomyelitis suspected. EXAM: MRI OF THE LEFT FOOT WITHOUT CONTRAST TECHNIQUE: Multiplanar, multisequence MR imaging of the left forefoot was performed. No intravenous contrast was administered. COMPARISON:  Radiographs same date FINDINGS: Despite efforts by the technologist and patient, mild motion artifact is present on today's exam and could not be eliminated. This reduces exam sensitivity and specificity. Bones/Joint/Cartilage Marrow assessment is limited by motion and incomplete fat saturation on the T2 weighted images. Sagittal inversion recovery images were obtained. There is cortical destruction with increased T2 and decreased T1 marrow signal in the head  of the 3rd proximal phalanx, and probably the base of the 3rd middle phalanx, suspicious for osteomyelitis as correlated with the earlier radiographs. There is possible involvement of the 2nd metatarsal head as well, although this is less definitive. The 1st, 4th and 5th toes appear intact. There are mild degenerative changes at the 1st metatarsophalangeal joint. No significant joint effusions. The alignment is normal at the Lisfranc joint. Ligaments Intact Lisfranc ligament. The collateral ligaments of the metatarsophalangeal joints are intact. Muscles and Tendons Increased T2 signal throughout the forefoot musculature, most likely due to underlying diabetes. No focal fluid collection or tenosynovitis. Soft tissues There is soft tissue swelling in   the 3rd toe. In correlation with the earlier radiographs, there is soft tissue emphysema as well. There is possible mild soft tissue swelling in the 2nd toe. No focal fluid collections are identified. IMPRESSION: 1. In correlation with the earlier radiographs, the findings are highly suspicious for osteomyelitis involving the head of the 3rd proximal phalanx, and likely the 3rd middle phalanx. 2. Possible osteomyelitis of the 2nd proximal phalangeal head. 3. Soft tissue swelling in the 3rd and 2nd toes without focal fluid collection. Electronically Signed   By: Richardean Sale M.D.   On: 09/08/2018 17:15   US Arterial Abi (screening Lower Extremity)  Result Date: 09/08/2018 CLINICAL DATA:  Peripheral vascular disease. Nonhealing left foot ulcer. Chronic osteomyelitis. Smoking history. EXAM: NONINVASIVE PHYSIOLOGIC VASCULAR STUDY OF BILATERAL LOWER EXTREMITIES TECHNIQUE: Evaluation of both lower extremities were performed at rest, including calculation of ankle-brachial indices with single level Doppler, pressure and pulse volume recording. COMPARISON:  None. FINDINGS: Right ABI:  0.51 Left ABI:  0.49 Right Lower Extremity: Monophasic and a few biphasic waveforms in the  right ankle. Left Lower Extremity:  Irregular arterial waveforms in left ankle. 0.5-0.79 Moderate PAD IMPRESSION: Right ABI is 0.51 and left ABI is 0.49. Findings are suggestive for moderate to severe peripheral arterial disease. Electronically Signed   By: Markus Daft M.D.   On: 09/08/2018 16:29   Dg Foot Complete Left  Result Date: 09/08/2018 CLINICAL DATA:  Third toe wound, initial encounter EXAM: LEFT FOOT - COMPLETE 3+ VIEW COMPARISON:  None. FINDINGS: Soft tissue gas is noted in the third toe near its base. Some bony resorption is noted in the distal aspect of the third proximal phalanx. IMPRESSION: Soft tissue infection involving the third toe with some resorption in the distal aspect of the third proximal phalanx consistent with osteomyelitis. Electronically Signed   By: Inez Catalina M.D.   On: 09/08/2018 11:49    Assessment & Plan:  BEREKET GERNERT is a 78 y.o. male with wet gangrene of the 3rd left toe and associated osteomyelitis. He also has some ulcerations and possible osteomyelitis of the 2nd toe and some superficial ulceration on the 4th toe.  I do not appreciate any palpable pulses and the ABI are consistent with some degree of moderate to severe disease. He will need Vascular Referral ASAP for healing purposes and to prevent further disease development. Given the wet gangrene I think he needs amputation of the 3rd toe and possible debridement of the 2nd toe and possible distal phalanx partial amputation.   -We have discussed the risk of bleeding, lack of bleeding due to poor blood flow, the risk of not healing and needing further surgery or amputation, the risk of need revascularization of the legs, the risk of infection and need for prolonged antibiotics, the need for packing of an open wound due to the wet gangrene.  -Attempted to call Legrand Como, son, but no answer, left a message.  -ECHO ordered for preop testing given lack of medical care -OR for amputation of the left 3rd toe and  debridement possible amputation of the 2nd toe   All questions were answered to the satisfaction of the patient.    Virl Cagey 09/09/2018, 9:10 AM

## 2018-09-09 NOTE — Progress Notes (Addendum)
PROGRESS NOTE  Thomas Garcia  ESP:[id number]  DOB: 21-Sep-1940  DOA: 09/08/2018 PCP: Fayrene Helper, MD  Brief Admission Hx: 78 year old male smoker with type 2 diabetes mellitus presented with worsening six-month infection involving the left foot and second and third toes.  He was found to have osteomyelitis in that left foot.  He was admitted for IV antibiotics and surgery consultation.  MDM/Assessment & Plan:   1. Cellulitis and osteomyelitis of the left foot is tolerating the IV broad-spectrum antibiotics.  ABI studies reveal moderate PVD in the lower extremity.  Patient has been seen by surgery and they are recommending operative management 09/10/2018.  The patient is having a preop echocardiogram done.  Patient consents to have surgery. 2. Type 2 diabetes mellitus with hyperglycemia- poorly controlled as evidenced by an hemoglobin A1c of 9.0.  The patient is on basal bolus insulin and his blood sugars remain controlled at this time.  Continue to monitor CBG 5 times per day. 3. Leukocytosis - WBC slightly down today.  Continue current treatments. 4. Nicotine addiction - Counseled patient to stop using tobacco products.  Offered nicotine patch while in hospital.  5. Essential hypertension - His blood pressure is suboptimally controlled, continue amlodipine 5 mg daily, add lisinopril 5 mg daily.   DVT prophylaxis: heparin Code Status: Full  Family Communication: I spoke with son on telephone and updated him Disposition Plan: continue inpatient hospital treatments  Consultants:  Surgery Dr. Constance Haw   Procedures:  Tentative 09/10/18  Echocardiogram: 09/09/18 IMPRESSIONS  1. The left ventricle has normal systolic function with an ejection fraction of 60-65%. The cavity size was normal. There is mild concentric left ventricular hypertrophy. Left ventricular diastolic Doppler parameters are consistent with impaired  relaxation. Indeterminate filling pressures No evidence of left  ventricular regional wall motion abnormalities.  2. The right ventricle has normal systolic function. The cavity was normal. There is no increase in right ventricular wall thickness.  3. The mitral valve is grossly normal. There is moderate mitral annular calcification present.  4. The tricuspid valve is grossly normal.  5. The aortic valve is tricuspid.  6. The aortic root is normal in size and structure.  Antimicrobials:  Ceftriaxone 6/2 >  Metronidazole 6/2 >  Subjective: Pt says that pain is controlled but the effects of the pain medication don't last very long.  He denies fever and chills.  He denies nausea and vomiting.   Objective: Vitals:   09/08/18 1538 09/08/18 2045 09/08/18 2111 09/09/18 0529  BP: (!) 142/76  (!) 159/55 (!) 151/70  Pulse: 84  71 89  Resp: '18  16 16  '$ Temp: 98.3 F (36.8 C)  97.7 F (36.5 C) 98.1 F (36.7 C)  TempSrc: Oral  Oral Oral  SpO2: 94% 98% 100% 99%  Weight: 86.9 kg     Height: '5\' 9"'$  (1.753 m)       Intake/Output Summary (Last 24 hours) at 09/09/2018 1351 Last data filed at 09/09/2018 1300 Gross per 24 hour  Intake 1206.96 ml  Output 100 ml  Net 1106.96 ml   Filed Weights   09/08/18 0941 09/08/18 1538  Weight: 86.2 kg 86.9 kg     REVIEW OF SYSTEMS  As per history otherwise all reviewed and reported negative  Exam:  General exam: awake, alert, NAD. Cooperative.  Respiratory system: Clear. No increased work of breathing. Cardiovascular system: S1 & S2 heard. No JVD, murmurs, gallops, clicks or pedal edema. Gastrointestinal system: Abdomen is nondistended, soft and nontender. Normal  bowel sounds heard. Central nervous system: Alert and oriented. No focal neurological deficits. Extremities: cellulitis left foot, warm, weak pedal pulses bilateral, necrotic area on left 3rd toe with surrounding purulent drainage.  Data Reviewed: Basic Metabolic Panel: Recent Labs  Lab 09/08/18 1124 09/09/18 0606  NA 136 136  K 4.4 3.9  CL 98  101  CO2 25 23  GLUCOSE 204* 153*  BUN 14 17  CREATININE 0.71 0.74  CALCIUM 8.9 8.3*   Liver Function Tests: Recent Labs  Lab 09/08/18 1124  AST 11*  ALT 12  ALKPHOS 95  BILITOT 0.7  PROT 7.6  ALBUMIN 3.3*   No results for input(s): LIPASE, AMYLASE in the last 168 hours. No results for input(s): AMMONIA in the last 168 hours. CBC: Recent Labs  Lab 09/08/18 1124 09/09/18 0606  WBC 20.0* 17.2*  NEUTROABS 17.0* 14.0*  HGB 13.6 12.3*  HCT 42.1 37.5*  MCV 91.5 90.1  PLT 392 415*   Cardiac Enzymes: No results for input(s): CKTOTAL, CKMB, CKMBINDEX, TROPONINI in the last 168 hours. CBG (last 3)  Recent Labs    09/09/18 0302 09/09/18 0719 09/09/18 1308  GLUCAP 178* 132* 114*   Recent Results (from the past 240 hour(s))  Wound or Superficial Culture     Status: None (Preliminary result)   Collection Time: 09/08/18 11:13 AM  Result Value Ref Range Status   Specimen Description   Final    FOOT TOE Performed at [facility], [address], [place], [place] [address]    Special Requests   Final    NONE Performed at [facility], [address], [place], [place] [address]    Gram Stain   Final    MODERATE WBC PRESENT,BOTH PMN AND MONONUCLEAR ABUNDANT GRAM NEGATIVE RODS ABUNDANT GRAM POSITIVE COCCI    Culture   Final    ABUNDANT GRAM NEGATIVE RODS CULTURE REINCUBATED FOR BETTER GROWTH Performed at [facility], [place] [address] [place], [place] [address]    Report Status PENDING  Incomplete  Culture, blood (Routine x 2)     Status: None (Preliminary result)   Collection Time: 09/08/18 11:25 AM  Result Value Ref Range Status   Specimen Description RIGHT ANTECUBITAL  Final   Special Requests   Final    BOTTLES DRAWN AEROBIC AND ANAEROBIC Blood Culture adequate volume   Culture   Final    NO GROWTH < 24 HOURS Performed at [facility], [address], [place], [place] [address]    Report Status PENDING  Incomplete  Culture, blood (Routine x 2)     Status:  None (Preliminary result)   Collection Time: 09/08/18 11:25 AM  Result Value Ref Range Status   Specimen Description LEFT ANTECUBITAL  Final   Special Requests   Final    BOTTLES DRAWN AEROBIC AND ANAEROBIC Blood Culture adequate volume   Culture   Final    NO GROWTH < 24 HOURS Performed at [facility], [address] [place], [place] [address]    Report Status PENDING  Incomplete  SARS Coronavirus 2 (CEPHEID - Performed in [place] hospital lab), Hosp Order     Status: None   Collection Time: 09/08/18 11:48 AM  Result Value Ref Range Status   SARS Coronavirus 2 NEGATIVE NEGATIVE Final    Comment: (NOTE) If result is NEGATIVE SARS-CoV-2 target nucleic acids are NOT DETECTED. The SARS-CoV-2 RNA is generally detectable in upper and lower  respiratory specimens during the acute phase of infection. The lowest  concentration of SARS-CoV-2 viral copies this assay can detect is 250  copies / mL. A negative result does not preclude SARS-CoV-2 infection  and should not be used as the sole basis for treatment or other  patient management decisions.  A negative result may occur with  improper specimen collection / handling, submission of specimen other  than nasopharyngeal swab, presence of viral mutation(s) within the  areas targeted by this assay, and inadequate number of viral copies  (<250 copies / mL). A negative result must be combined with clinical  observations, patient history, and epidemiological information. If result is POSITIVE SARS-CoV-2 target nucleic acids are DETECTED. The SARS-CoV-2 RNA is generally detectable in upper and lower  respiratory specimens dur ing the acute phase of infection.  Positive  results are indicative of active infection with SARS-CoV-2.  Clinical  correlation with patient history and other diagnostic information is  necessary to determine patient infection status.  Positive results do  not rule out bacterial infection or co-infection with other  viruses. If result is PRESUMPTIVE POSTIVE SARS-CoV-2 nucleic acids MAY BE PRESENT.   A presumptive positive result was obtained on the submitted specimen  and confirmed on repeat testing.  While 2019 novel coronavirus  (SARS-CoV-2) nucleic acids may be present in the submitted sample  additional confirmatory testing may be necessary for epidemiological  and / or clinical management purposes  to differentiate between  SARS-CoV-2 and other Sarbecovirus currently known to infect humans.  If clinically indicated additional testing with an alternate test  methodology [phone]) is advised. The SARS-CoV-2 RNA is generally  detectable in upper and lower respiratory sp ecimens during the acute  phase of infection. The expected result is Negative. Fact Sheet for Patients:  StrictlyIdeas.no Fact Sheet for Healthcare Providers: BankingDealers.co.za This test is not yet approved or cleared by the [place] FDA and has been authorized for detection and/or diagnosis of SARS-CoV-2 by FDA under an Emergency Use Authorization (EUA).  This EUA will remain in effect (meaning this test can be used) for the duration of the COVID-19 declaration under Section 564(b)(1) of the Act, 21 U.S.C. section 360bbb-3(b)(1), unless the authorization is terminated or revoked sooner. Performed at [facility], [address], [place], [place] [address]      Studies: Dg Chest 2 View  Result Date: 09/08/2018 CLINICAL DATA:  Suspected sepsis. Pt has open wounds to 3rd toe of Lt foot with redness and swelling to entire Lt foot. Pt states wounds have been present x several weeks. Pt c/o chills intermittently. Hx smoker, diabetes Suspected Sepsis EXAM: CHEST - 2 VIEW COMPARISON:  None. FINDINGS: Normal mediastinum and cardiac silhouette. Normal pulmonary vasculature. No evidence of effusion, infiltrate, or pneumothorax. No acute bony abnormality. IMPRESSION: No acute  cardiopulmonary process. Electronically Signed   By: Suzy Bouchard M.D.   On: 09/08/2018 11:57   Mri Left Foot Without Contrast  Result Date: 09/08/2018 CLINICAL DATA:  Left foot wound with swelling for 6 months. Diabetes. Osteomyelitis suspected. EXAM: MRI OF THE LEFT FOOT WITHOUT CONTRAST TECHNIQUE: Multiplanar, multisequence MR imaging of the left forefoot was performed. No intravenous contrast was administered. COMPARISON:  Radiographs same date FINDINGS: Despite efforts by the technologist and patient, mild motion artifact is present on today's exam and could not be eliminated. This reduces exam sensitivity and specificity. Bones/Joint/Cartilage Marrow assessment is limited by motion and incomplete fat saturation on the T2 weighted images. Sagittal inversion recovery images were obtained. There is cortical destruction with increased T2 and decreased T1  marrow signal in the head of the 3rd proximal phalanx, and probably the base of the 3rd middle phalanx, suspicious for osteomyelitis as correlated with the earlier radiographs. There is possible involvement of the 2nd metatarsal head as well, although this is less definitive. The 1st, 4th and 5th toes appear intact. There are mild degenerative changes at the 1st metatarsophalangeal joint. No significant joint effusions. The alignment is normal at the Lisfranc joint. Ligaments Intact Lisfranc ligament. The collateral ligaments of the metatarsophalangeal joints are intact. Muscles and Tendons Increased T2 signal throughout the forefoot musculature, most likely due to underlying diabetes. No focal fluid collection or tenosynovitis. Soft tissues There is soft tissue swelling in the 3rd toe. In correlation with the earlier radiographs, there is soft tissue emphysema as well. There is possible mild soft tissue swelling in the 2nd toe. No focal fluid collections are identified. IMPRESSION: 1. In correlation with the earlier radiographs, the findings are highly  suspicious for osteomyelitis involving the head of the 3rd proximal phalanx, and likely the 3rd middle phalanx. 2. Possible osteomyelitis of the 2nd proximal phalangeal head. 3. Soft tissue swelling in the 3rd and 2nd toes without focal fluid collection. Electronically Signed   By: Richardean Sale M.D.   On: 09/08/2018 17:15   US Arterial Abi (screening Lower Extremity)  Result Date: 09/08/2018 CLINICAL DATA:  Peripheral vascular disease. Nonhealing left foot ulcer. Chronic osteomyelitis. Smoking history. EXAM: NONINVASIVE PHYSIOLOGIC VASCULAR STUDY OF BILATERAL LOWER EXTREMITIES TECHNIQUE: Evaluation of both lower extremities were performed at rest, including calculation of ankle-brachial indices with single level Doppler, pressure and pulse volume recording. COMPARISON:  None. FINDINGS: Right ABI:  0.51 Left ABI:  0.49 Right Lower Extremity: Monophasic and a few biphasic waveforms in the right ankle. Left Lower Extremity:  Irregular arterial waveforms in left ankle. 0.5-0.79 Moderate PAD IMPRESSION: Right ABI is 0.51 and left ABI is 0.49. Findings are suggestive for moderate to severe peripheral arterial disease. Electronically Signed   By: Markus Daft M.D.   On: 09/08/2018 16:29   Dg Foot Complete Left  Result Date: 09/08/2018 CLINICAL DATA:  Third toe wound, initial encounter EXAM: LEFT FOOT - COMPLETE 3+ VIEW COMPARISON:  None. FINDINGS: Soft tissue gas is noted in the third toe near its base. Some bony resorption is noted in the distal aspect of the third proximal phalanx. IMPRESSION: Soft tissue infection involving the third toe with some resorption in the distal aspect of the third proximal phalanx consistent with osteomyelitis. Electronically Signed   By: Inez Catalina M.D.   On: 09/08/2018 11:49   Scheduled Meds:  amLODipine  5 mg Oral Daily   heparin injection (subcutaneous)  5,000 Units Subcutaneous Q8H   insulin aspart  0-15 Units Subcutaneous TID WC   insulin aspart  0-5 Units  Subcutaneous QHS   insulin aspart  4 Units Subcutaneous TID WC   insulin glargine  10 Units Subcutaneous QHS   insulin starter kit- pen needles  1 kit Other Once   Continuous Infusions:  cefTRIAXone (ROCEPHIN)  IV 200 mL/hr at 09/08/18 1720   And   metronidazole 500 mg (09/09/18 0809)    Principal Problem:   Osteomyelitis of left foot (HCC) Active Problems:   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 ([place])   NICOTINE ADDICTION   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 (HCC)   Cellulitis of left foot   Leukocytosis   Fever and chills   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  1. Check the care team in [facility] and look for a) attending/consulting TR[facility] provider listed and b) the [facility] team listed 2. Log into [email or web address] and use 's universal password to access. If you do not have the password, please contact the hospital operator. 3. Locate the [facility] provider you are looking for under [facility] and page to a number that you can be directly reached. 4. If you still have difficulty reaching the provider, please page the [facility] (Director on Call) for the Hospitalists listed on amion for assistance.

## 2018-09-09 NOTE — Progress Notes (Addendum)
Inpatient Diabetes Program Recommendations  AACE/ADA: New Consensus Statement on Inpatient Glycemic Control (2015)  Target Ranges:  Prepandial:   less than 140 mg/dL      Peak postprandial:   less than 180 mg/dL (1-2 hours)      Critically ill patients:  140 - 180 mg/dL   Lab Results  Component Value Date   GLUCAP 132 (H) 09/09/2018   HGBA1C 9.0 (H) 09/08/2018    Review of Glycemic Control Results for Thomas Garcia, Thomas Garcia (MRN [id number]) as of 09/09/2018 09:21  Ref. Range 09/08/2018 10:26 09/08/2018 17:04 09/08/2018 21:07 09/09/2018 03:02 09/09/2018 07:19  Glucose-Capillary Latest Ref Range: 70 - 99 mg/dL 190 (H) 139 (H) 127 (H) 178 (H) 132 (H)   Diabetes history: DM2 Outpatient Diabetes medications: None Current orders for Inpatient glycemic control: Lantus 10 units daily + Novolog 4 tid meal coverage + Novolog moderate correction tid + hs 0-5 units  Inpatient Diabetes Program Recommendations:   Ordered insulin starter kit pen needles to begin insulin injection teaching in case patient is discharged home on insulin to be prepared. Noted insulin regimen and will follow patient. Nurses, please begin insulin teaching to prepare for the possibility of home insulin regimen. Will plan to speak with patient (DM coordinator working from [place]).  Thank you, Nani Gasser. Thomas Buth, RN, MSN, CDE  Diabetes Coordinator Inpatient Glycemic Control Team Team Pager [phone] (8am-5pm) 09/09/2018 9:29 AM

## 2018-09-09 NOTE — H&P (View-Only) (Signed)
[facility] Consult  Reason for Consult: Left 3rd toe osteomyelitis, cellulitis  Referring Physician:  Dr. Wynetta Emery, MD   Chief Complaint    Foot Pain      Thomas Garcia is a 78 y.o. male.  HPI:  Mr. Thomas Garcia is a very sweet 78 yo who does not seek out medical attention often and came to the ED with foot pain on the left with swelling that he says has been present for at least 6 months. He has diabetes that he knows of but has been on and off medications in the past and was not on medications currently to treat.  He says that he has noticed that the toes on his left foot have started to lose the toenails and that he does not recall any ulcers or blisters until this recent ulcer on his 3rd toe on the left. He says that the foot has been swollen for some time. He had tried to treat the area with some topical ointments but has not sought out any medical treatment.  He says that he has never had any heart attacks or strokes that he knows of but that he did have a time when he felt "someone pulls something down in his chest."  He is able to drive and lives alone. He has sons that are available if he needs them or in an emergency. He says he drives about 2 miles to the local store but this about the limit to his outings.   Past Medical History:  Diagnosis Date  . Diabetes mellitus without complication ([place])   . Kidney stones     Past Surgical History:  Procedure Laterality Date  . CYSTOSCOPY  05/08/2012   Procedure: CYSTOSCOPY;  Surgeon: Marissa Nestle, MD;  Location: [facility];  Service: Urology;  Laterality: N/A;  Evacuation of clots  . KIDNEY SURGERY     sutures   . SPLENECTOMY    . TRANSURETHRAL RESECTION OF PROSTATE  05/14/2012   Procedure: TRANSURETHRAL RESECTION OF THE PROSTATE (TURP);  Surgeon: Marissa Nestle, MD;  Location: [facility];  Service: Urology;  Laterality: N/A;    History reviewed. No pertinent family history.  Social History   Tobacco Use  . Smoking  status: Current Every Day Smoker    Packs/day: 0.25    Years: 45.00    Pack years: 11.25    Types: Cigarettes  . Smokeless tobacco: Never Used  Substance Use Topics  . Alcohol use: No  . Drug use: No    Medications:  I have reviewed the patient's current medications. Prior to Admission:  Medications Prior to Admission  Medication Sig Dispense Refill Last Dose  . naproxen sodium (ALEVE) 220 MG tablet Take 440 mg by mouth 2 (two) times daily as needed (pain).   09/08/2018 at Unknown time   Scheduled: . amLODipine  5 mg Oral Daily  . heparin injection (subcutaneous)  5,000 Units Subcutaneous Q8H  . insulin aspart  0-15 Units Subcutaneous TID WC  . insulin aspart  0-5 Units Subcutaneous QHS  . insulin aspart  4 Units Subcutaneous TID WC  . insulin glargine  10 Units Subcutaneous QHS  . insulin starter kit- pen needles  1 kit Other Once  . living well with diabetes book   Does not apply Once   Continuous: . cefTRIAXone (ROCEPHIN)  IV 200 mL/hr at 09/08/18 1720   And  . metronidazole 500 mg (09/09/18 0809)   SWF:[id number], ondansetron (ZOFRAN) IV, oxyCODONE-acetaminophen  No Known  Allergies  ROS:  A comprehensive review of systems was negative except for: Musculoskeletal: positive for left foot swelling and pain, drainage from the 3rd toe  Blood pressure (!) 151/70, pulse 89, temperature 98.1 F (36.7 C), temperature source Oral, resp. rate 16, height 5' 9" (1.753 m), weight 86.9 kg, SpO2 99 %. Physical Exam Vitals signs reviewed.  Constitutional:      Appearance: Normal appearance.  HENT:     Head: Normocephalic.     Nose: Nose normal.     Mouth/Throat:     Mouth: Mucous membranes are moist.  Eyes:     Pupils: Pupils are equal, round, and reactive to light.  Neck:     Musculoskeletal: Normal range of motion.  Cardiovascular:     Rate and Rhythm: Normal rate.     Pulses:          Popliteal pulses are 1+ on the right side and 1+ on the left side.       Dorsalis  pedis pulses are 0 on the right side and 0 on the left side.       Posterior tibial pulses are 0 on the right side and 0 on the left side.  Pulmonary:     Effort: Pulmonary effort is normal.  Abdominal:     General: There is no distension.     Palpations: Abdomen is soft.     Tenderness: There is no abdominal tenderness.  Musculoskeletal:     Right lower leg: No edema.     Left lower leg: Edema present.     Comments: Left foot swelling and erythema to the mid foot, 3rd toe with wet gangrene and drainage, 2nd toe with some ulcerations, no toenails on the 1st, 2nd toe, small attenuated nails on the remaining toes, 4th digit with some superficial ulcerations but no drainage, and 5th digit without ulcers or drainage  Skin:    General: Skin is warm.  Neurological:     General: No focal deficit present.     Mental Status: He is alert and oriented to person, place, and time.  Psychiatric:        Mood and Affect: Mood normal.        Behavior: Behavior normal.        Thought Content: Thought content normal.        Judgment: Judgment normal.       Results: Results for orders placed or performed during the hospital encounter of 09/08/18 (from the past 48 hour(s))  CBG monitoring, ED     Status: Abnormal   Collection Time: 09/08/18 10:26 AM  Result Value Ref Range   Glucose-Capillary 190 (H) 70 - 99 mg/dL   Comment 1 Notify RN   Urinalysis, Routine w reflex microscopic     Status: Abnormal   Collection Time: 09/08/18 10:37 AM  Result Value Ref Range   Color, Urine YELLOW YELLOW   APPearance CLEAR CLEAR   Specific Gravity, Urine 1.014 1.005 - 1.030   pH 6.0 5.0 - 8.0   Glucose, UA >=500 (A) NEGATIVE mg/dL   Hgb urine dipstick SMALL (A) NEGATIVE   Bilirubin Urine NEGATIVE NEGATIVE   Ketones, ur 5 (A) NEGATIVE mg/dL   Protein, ur 30 (A) NEGATIVE mg/dL   Nitrite NEGATIVE NEGATIVE   Leukocytes,Ua NEGATIVE NEGATIVE   RBC / HPF 21-50 0 - 5 RBC/hpf   WBC, UA 0-5 0 - 5 WBC/hpf    Bacteria, UA RARE (A) NONE SEEN   Mucus PRESENT  Hyaline Casts, UA PRESENT     Comment: Performed at [facility], [address] [place], [place] [address]  Wound or Superficial Culture     Status: None (Preliminary result)   Collection Time: 09/08/18 11:13 AM  Result Value Ref Range   Specimen Description      FOOT TOE Performed at [facility], [address] [place], [place] [address]    Special Requests      NONE Performed at [facility], [address] [place], [place] [address]    Gram Stain      MODERATE WBC PRESENT,BOTH PMN AND MONONUCLEAR ABUNDANT GRAM NEGATIVE RODS ABUNDANT GRAM POSITIVE COCCI    Culture      ABUNDANT GRAM NEGATIVE RODS CULTURE REINCUBATED FOR BETTER GROWTH Performed at [facility], [address] [place], [place] [address]    Report Status PENDING   Comprehensive metabolic panel     Status: Abnormal   Collection Time: 09/08/18 11:24 AM  Result Value Ref Range   Sodium 136 135 - 145 mmol/L   Potassium 4.4 3.5 - 5.1 mmol/L   Chloride 98 98 - 111 mmol/L   CO2 25 22 - 32 mmol/L   Glucose, Bld 204 (H) 70 - 99 mg/dL   BUN 14 8 - 23 mg/dL   Creatinine, Ser 0.71 0.61 - 1.24 mg/dL   Calcium 8.9 8.9 - 10.3 mg/dL   Total Protein 7.6 6.5 - 8.1 g/dL   Albumin 3.3 (L) 3.5 - 5.0 g/dL   AST 11 (L) 15 - 41 U/L   ALT 12 0 - 44 U/L   Alkaline Phosphatase 95 38 - 126 U/L   Total Bilirubin 0.7 0.3 - 1.2 mg/dL   GFR calc non Af Amer >60 >60 mL/min   GFR calc Af Amer >60 >60 mL/min   Anion gap 13 5 - 15    Comment: Performed at [facility], [address] [place], [place] [address]  Lactic acid, plasma     Status: None   Collection Time: 09/08/18 11:24 AM  Result Value Ref Range   Lactic Acid, Venous 1.3 0.5 - 1.9 mmol/L    Comment: Performed at [facility], [address] [place], [place] [address]  CBC with Differential     Status: Abnormal   Collection Time: 09/08/18 11:24 AM  Result Value Ref Range   WBC 20.0 (H) 4.0 - 10.5 K/uL   RBC  4.60 4.22 - 5.81 MIL/uL   Hemoglobin 13.6 13.0 - 17.0 g/dL   HCT 42.1 39.0 - 52.0 %   MCV 91.5 80.0 - 100.0 fL   MCH 29.6 26.0 - 34.0 pg   MCHC 32.3 30.0 - 36.0 g/dL   RDW 13.5 11.5 - 15.5 %   Platelets 392 150 - 400 K/uL    Comment: PLATELET COUNT CONFIRMED BY SMEAR SPECIMEN CHECKED FOR CLOTS    nRBC 0.0 0.0 - 0.2 %   Neutrophils Relative % 85 %   Neutro Abs 17.0 (H) 1.7 - 7.7 K/uL   Lymphocytes Relative 7 %   Lymphs Abs 1.4 0.7 - 4.0 K/uL   Monocytes Relative 7 %   Monocytes Absolute 1.5 (H) 0.1 - 1.0 K/uL   Eosinophils Relative 0 %   Eosinophils Absolute 0.0 0.0 - 0.5 K/uL   Basophils Relative 1 %   Basophils Absolute 0.1 0.0 - 0.1 K/uL   Immature Granulocytes 0 %   Abs Immature Granulocytes 0.05 0.00 - 0.07 K/uL    Comment: Performed at   [facility], [address] [place], [place] [address]  Protime-INR     Status: None   Collection Time: 09/08/18 11:24 AM  Result Value Ref Range   Prothrombin Time 13.7 11.4 - 15.2 seconds   INR 1.1 0.8 - 1.2    Comment: (NOTE) INR goal varies based on device and disease states. Performed at [facility], [address], [place], [place] [address]   Sedimentation rate     Status: Abnormal   Collection Time: 09/08/18 11:24 AM  Result Value Ref Range   Sed Rate 72 (H) 0 - 16 mm/hr    Comment: Performed at [facility], [address] [place], [place] [address]  Hemoglobin A1c     Status: Abnormal   Collection Time: 09/08/18 11:24 AM  Result Value Ref Range   Hgb A1c MFr Bld 9.0 (H) 4.8 - 5.6 %    Comment: (NOTE) Pre diabetes:          5.7%-6.4% Diabetes:              >6.4% Glycemic control for   <7.0% adults with diabetes    Mean Plasma Glucose 211.6 mg/dL    Comment: Performed at [facility] [address], [place], [place] [address]  Culture, blood (Routine x 2)     Status: None (Preliminary result)   Collection Time: 09/08/18 11:25 AM  Result Value Ref Range   Specimen Description RIGHT ANTECUBITAL    Special  Requests      BOTTLES DRAWN AEROBIC AND ANAEROBIC Blood Culture adequate volume   Culture      NO GROWTH < 24 HOURS Performed at [facility], [address], [place], [place] [address]    Report Status PENDING   Culture, blood (Routine x 2)     Status: None (Preliminary result)   Collection Time: 09/08/18 11:25 AM  Result Value Ref Range   Specimen Description LEFT ANTECUBITAL    Special Requests      BOTTLES DRAWN AEROBIC AND ANAEROBIC Blood Culture adequate volume   Culture      NO GROWTH < 24 HOURS Performed at [facility], [address] [place], [place] [address]    Report Status PENDING   SARS Coronavirus 2 (CEPHEID - Performed in [place] hospital lab), Hosp Order     Status: None   Collection Time: 09/08/18 11:48 AM  Result Value Ref Range   SARS Coronavirus 2 NEGATIVE NEGATIVE    Comment: (NOTE) If result is NEGATIVE SARS-CoV-2 target nucleic acids are NOT DETECTED. The SARS-CoV-2 RNA is generally detectable in upper and lower  respiratory specimens during the acute phase of infection. The lowest  concentration of SARS-CoV-2 viral copies this assay can detect is 250  copies / mL. A negative result does not preclude SARS-CoV-2 infection  and should not be used as the sole basis for treatment or other  patient management decisions.  A negative result may occur with  improper specimen collection / handling, submission of specimen other  than nasopharyngeal swab, presence of viral mutation(s) within the  areas targeted by this assay, and inadequate number of viral copies  (<250 copies / mL). A negative result must be combined with clinical  observations, patient history, and epidemiological information. If result is POSITIVE SARS-CoV-2 target nucleic acids are DETECTED. The SARS-CoV-2 RNA is generally detectable in upper and lower  respiratory specimens dur ing the acute phase of infection.  Positive  results are indicative of active infection with SARS-CoV-2.  Clinical  correlation with patient history and other diagnostic information is  necessary to determine patient infection status.  Positive results do  not rule out bacterial infection or co-infection with other viruses. If result is PRESUMPTIVE POSTIVE SARS-CoV-2 nucleic acids MAY BE PRESENT.   A presumptive positive result was obtained on the submitted specimen  and confirmed on repeat testing.  While 2019 novel coronavirus  (SARS-CoV-2) nucleic acids may be present in the submitted sample  additional confirmatory testing may be necessary for epidemiological  and / or clinical management purposes  to differentiate between  SARS-CoV-2 and other Sarbecovirus currently known to infect humans.  If clinically indicated additional testing with an alternate test  methodology ([id number]) is advised. The SARS-CoV-2 RNA is generally  detectable in upper and lower respiratory sp ecimens during the acute  phase of infection. The expected result is Negative. Fact Sheet for Patients:  https://www.fda.gov/media/136312/download Fact Sheet for Healthcare Providers: https://www.fda.gov/media/136313/download This test is not yet approved or cleared by the [place] FDA and has been authorized for detection and/or diagnosis of SARS-CoV-2 by FDA under an Emergency Use Authorization (EUA).  This EUA will remain in effect (meaning this test can be used) for the duration of the COVID-19 declaration under Section 564(b)(1) of the Act, 21 U.S.C. section 360bbb-3(b)(1), unless the authorization is terminated or revoked sooner. Performed at [facility], [address] [place], [place] [address]   Lactic acid, plasma     Status: None   Collection Time: 09/08/18  2:12 PM  Result Value Ref Range   Lactic Acid, Venous 1.3 0.5 - 1.9 mmol/L    Comment: Performed at [facility], [address] [place], [place] [address]  Glucose, capillary     Status: Abnormal   Collection Time: 09/08/18  5:04 PM  Result  Value Ref Range   Glucose-Capillary 139 (H) 70 - 99 mg/dL  C-reactive protein     Status: Abnormal   Collection Time: 09/08/18  5:44 PM  Result Value Ref Range   CRP 16.0 (H) <1.0 mg/dL    Comment: Performed at [facility], [address] [place], [place] [address]  Prealbumin     Status: Abnormal   Collection Time: 09/08/18  5:44 PM  Result Value Ref Range   Prealbumin 8.7 (L) 18 - 38 mg/dL    Comment: Performed at [facility], [address] [place], [place] [address]  Glucose, capillary     Status: Abnormal   Collection Time: 09/08/18  9:07 PM  Result Value Ref Range   Glucose-Capillary 127 (H) 70 - 99 mg/dL  Glucose, capillary     Status: Abnormal   Collection Time: 09/09/18  3:02 AM  Result Value Ref Range   Glucose-Capillary 178 (H) 70 - 99 mg/dL  Basic metabolic panel     Status: Abnormal   Collection Time: 09/09/18  6:06 AM  Result Value Ref Range   Sodium 136 135 - 145 mmol/L   Potassium 3.9 3.5 - 5.1 mmol/L   Chloride 101 98 - 111 mmol/L   CO2 23 22 - 32 mmol/L   Glucose, Bld 153 (H) 70 - 99 mg/dL   BUN 17 8 - 23 mg/dL   Creatinine, Ser 0.74 0.61 - 1.24 mg/dL   Calcium 8.3 (L) 8.9 - 10.3 mg/dL   GFR calc non Af Amer >60 >60 mL/min   GFR calc Af Amer >60 >60 mL/min   Anion gap 12 5 - 15    Comment: Performed at [facility], [address] [place], [place] [address]  CBC with Differential     Status: Abnormal   Collection Time: 09/09/18  6:06 AM  Result Value Ref Range   WBC 17.2 (H) 4.0 - 10.5 K/uL   RBC 4.16 (L) 4.22 - 5.81 MIL/uL   Hemoglobin 12.3 (L) 13.0 - 17.0 g/dL   HCT 37.5 (L) 39.0 - 52.0 %   MCV 90.1 80.0 - 100.0 fL   MCH 29.6 26.0 - 34.0 pg   MCHC 32.8 30.0 - 36.0 g/dL   RDW 13.3 11.5 - 15.5 %   Platelets 415 (H) 150 - 400 K/uL   nRBC 0.0 0.0 - 0.2 %   Neutrophils Relative % 83 %   Neutro Abs 14.0 (H) 1.7 - 7.7 K/uL   Lymphocytes Relative 11 %   Lymphs Abs 2.0 0.7 - 4.0 K/uL   Monocytes Relative 6 %   Monocytes Absolute 1.0 0.1 - 1.0  K/uL   Eosinophils Relative 0 %   Eosinophils Absolute 0.1 0.0 - 0.5 K/uL   Basophils Relative 0 %   Basophils Absolute 0.1 0.0 - 0.1 K/uL   Immature Granulocytes 0 %   Abs Immature Granulocytes 0.06 0.00 - 0.07 K/uL    Comment: Performed at [facility], [address] [place], [place] [address]  Glucose, capillary     Status: Abnormal   Collection Time: 09/09/18  7:19 AM  Result Value Ref Range   Glucose-Capillary 132 (H) 70 - 99 mg/dL   Personally reviewed imaging- concern for osteo of the 3rd toe on left, no abscess  Dg Chest 2 View  Result Date: 09/08/2018 CLINICAL DATA:  Suspected sepsis. Pt has open wounds to 3rd toe of Lt foot with redness and swelling to entire Lt foot. Pt states wounds have been present x several weeks. Pt c/o chills intermittently. Hx smoker, diabetes Suspected Sepsis EXAM: CHEST - 2 VIEW COMPARISON:  None. FINDINGS: Normal mediastinum and cardiac silhouette. Normal pulmonary vasculature. No evidence of effusion, infiltrate, or pneumothorax. No acute bony abnormality. IMPRESSION: No acute cardiopulmonary process. Electronically Signed   By: Stewart  Edmunds M.D.   On: 09/08/2018 11:57   Mri Left Foot Without Contrast  Result Date: 09/08/2018 CLINICAL DATA:  Left foot wound with swelling for 6 months. Diabetes. Osteomyelitis suspected. EXAM: MRI OF THE LEFT FOOT WITHOUT CONTRAST TECHNIQUE: Multiplanar, multisequence MR imaging of the left forefoot was performed. No intravenous contrast was administered. COMPARISON:  Radiographs same date FINDINGS: Despite efforts by the technologist and patient, mild motion artifact is present on today's exam and could not be eliminated. This reduces exam sensitivity and specificity. Bones/Joint/Cartilage Marrow assessment is limited by motion and incomplete fat saturation on the T2 weighted images. Sagittal inversion recovery images were obtained. There is cortical destruction with increased T2 and decreased T1 marrow signal in the head  of the 3rd proximal phalanx, and probably the base of the 3rd middle phalanx, suspicious for osteomyelitis as correlated with the earlier radiographs. There is possible involvement of the 2nd metatarsal head as well, although this is less definitive. The 1st, 4th and 5th toes appear intact. There are mild degenerative changes at the 1st metatarsophalangeal joint. No significant joint effusions. The alignment is normal at the Lisfranc joint. Ligaments Intact Lisfranc ligament. The collateral ligaments of the metatarsophalangeal joints are intact. Muscles and Tendons Increased T2 signal throughout the forefoot musculature, most likely due to underlying diabetes. No focal fluid collection or tenosynovitis. Soft tissues There is soft tissue swelling in   the 3rd toe. In correlation with the earlier radiographs, there is soft tissue emphysema as well. There is possible mild soft tissue swelling in the 2nd toe. No focal fluid collections are identified. IMPRESSION: 1. In correlation with the earlier radiographs, the findings are highly suspicious for osteomyelitis involving the head of the 3rd proximal phalanx, and likely the 3rd middle phalanx. 2. Possible osteomyelitis of the 2nd proximal phalangeal head. 3. Soft tissue swelling in the 3rd and 2nd toes without focal fluid collection. Electronically Signed   By: Richardean Sale M.D.   On: 09/08/2018 17:15   US Arterial Abi (screening Lower Extremity)  Result Date: 09/08/2018 CLINICAL DATA:  Peripheral vascular disease. Nonhealing left foot ulcer. Chronic osteomyelitis. Smoking history. EXAM: NONINVASIVE PHYSIOLOGIC VASCULAR STUDY OF BILATERAL LOWER EXTREMITIES TECHNIQUE: Evaluation of both lower extremities were performed at rest, including calculation of ankle-brachial indices with single level Doppler, pressure and pulse volume recording. COMPARISON:  None. FINDINGS: Right ABI:  0.51 Left ABI:  0.49 Right Lower Extremity: Monophasic and a few biphasic waveforms in the  right ankle. Left Lower Extremity:  Irregular arterial waveforms in left ankle. 0.5-0.79 Moderate PAD IMPRESSION: Right ABI is 0.51 and left ABI is 0.49. Findings are suggestive for moderate to severe peripheral arterial disease. Electronically Signed   By: Markus Daft M.D.   On: 09/08/2018 16:29   Dg Foot Complete Left  Result Date: 09/08/2018 CLINICAL DATA:  Third toe wound, initial encounter EXAM: LEFT FOOT - COMPLETE 3+ VIEW COMPARISON:  None. FINDINGS: Soft tissue gas is noted in the third toe near its base. Some bony resorption is noted in the distal aspect of the third proximal phalanx. IMPRESSION: Soft tissue infection involving the third toe with some resorption in the distal aspect of the third proximal phalanx consistent with osteomyelitis. Electronically Signed   By: Inez Catalina M.D.   On: 09/08/2018 11:49    Assessment & Plan:  BEREKET GERNERT is a 78 y.o. male with wet gangrene of the 3rd left toe and associated osteomyelitis. He also has some ulcerations and possible osteomyelitis of the 2nd toe and some superficial ulceration on the 4th toe.  I do not appreciate any palpable pulses and the ABI are consistent with some degree of moderate to severe disease. He will need Vascular Referral ASAP for healing purposes and to prevent further disease development. Given the wet gangrene I think he needs amputation of the 3rd toe and possible debridement of the 2nd toe and possible distal phalanx partial amputation.   -We have discussed the risk of bleeding, lack of bleeding due to poor blood flow, the risk of not healing and needing further surgery or amputation, the risk of need revascularization of the legs, the risk of infection and need for prolonged antibiotics, the need for packing of an open wound due to the wet gangrene.  -Attempted to call Legrand Como, son, but no answer, left a message.  -ECHO ordered for preop testing given lack of medical care -OR for amputation of the left 3rd toe and  debridement possible amputation of the 2nd toe   All questions were answered to the satisfaction of the patient.    Virl Cagey 09/09/2018, 9:10 AM

## 2018-09-09 NOTE — Progress Notes (Signed)
Ate small amount of supper and vomited small amount.  Gave zofran

## 2018-09-09 NOTE — Progress Notes (Addendum)
Vomited after lunch today.  Had zofran.  Texted Dr. Wynetta Emery.  Showered with assist and gauze dressing replaced on left toes.  Has requested pain medicine for pain in toes.  Consent signed for surgery tomorrow and knows to not eat or drink after midnight.  MRSA sent to lab.  Diabetes teaching with booklet as well as insulin pen with repeat demonstration.

## 2018-09-09 NOTE — Progress Notes (Addendum)
Initial Nutrition Assessment  DOCUMENTATION CODES:  Not applicable  INTERVENTION:  MVI with minerals  Will order 30 mL Prostat BID, each supplement provides 100 kcal and 15 grams of protein.  Glucerna Shake po BID, each supplement provides 220 kcal and 10 grams of protein  Gave pt simple nutrition goals/diet recommendations for wound healing. Pt does feel he has the willpower to modify his baseline behaviors at home.   NUTRITION DIAGNOSIS:  Increased nutrient needs related to wound healing as evidenced by estimated nutrition requirements for this outcome  GOAL:  Patient will meet greater than or equal to 90% of their needs  MONITOR:  PO intake, Labs, I & O's, Supplement acceptance, Diet advancement, Weight trends, Skin  REASON FOR ASSESSMENT:  Consult Wound healing  ASSESSMENT:  78 y/o male PMHx tobacco abuse, DM2 and little medical follow up. Presented to ED with 6 month history of L foot pain, which has progressively worsened. 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 Admitted for operative management  Per general surgery evaluation, pt will need amputation of 3rd toe and either debridement or amputation of 2nd toe.   Pt reports that he lives on his own. He admits that he does not take his meds or follow a DM diet. He does not ever check his BG. He has a disorganized eating pattern and does not eat regularly. He lives on [employer] and prepackaged meals. A majority of his diet is cold cereal and canned soups. He does not like to cook because he cant stand for long periods. He drinks coffee (w/ sweetener), sweet tea, 2% milk and occasionally soda. He doesn't drink diet drinks. His brother occasionally takes him to buffets or fast food restaurants where he eats foods he knows he shouldn't, but says he cant resist  RD spoke with patient about what he needs to do diet-wise for his wound to heal, namely prioritize protein and  keep his sugars in check. Unfortunately, pt is candid about poor compliance/lack of self control. He does not want to change how he eats. He says he knows that when he gets home he will not do what is instructed. In fact, he says hed rather them just amputate his whole foot right now then deal with performing treatments or changing his behaviors when he goes home.  RD tried to motivate pt. Stressed that he could help his foot heal and avoid need for further amputation with only small diet changes. RD gave him simple goals: 1. Eat protein at meals (reviewed some foods that are already in his diet that are good. 2. Cut out the sweetened beverages from his diet. 3. Take his DM meds as prescribed. Again, he says he does not foresee himself making changes.    There is minimal weight history in chart. He is currently 191.6 lbs. Next most recent measurement in chart is from 2014 when he was ~205-210 lbs  Currently, pt is eating 75% of his meals. However, he reports a poor appetite. Apparently he just recently developed some nausea/vomiting as well. He agreed to protein supplements in the hospital, but he says he does not want to take anything at home. He does not like being in the hospital and says he is gonna jump out the window if he stays here much longer.   Labs: Wbc: 20-> 17.2, BGs 153->132->114. A1c: 9.0, Albumin: 3.3, CRp: 16.0 Meds: Insulin, IV abx, prn zofran/oxycodone  Recent Labs  Lab 09/08/18 1124  09/09/18 0606  NA 136 136  K 4.4 3.9  CL 98 101  CO2 25 23  BUN 14 17  CREATININE 0.71 0.74  CALCIUM 8.9 8.3*  GLUCOSE 204* 153*   NUTRITION - FOCUSED PHYSICAL EXAM: WDL  Diet Order:   Diet Order            Diet Carb Modified Fluid consistency: Thin; Room service appropriate? Yes  Diet effective now             EDUCATION NEEDS:  Education needs have been addressed  Skin: Wet gangrene/ulcer to L 3rd toe   Last BM:  5/31  Height:  Ht Readings from Last 1 Encounters:  09/08/18  5\' 9"  (1.753 m)   Weight:  Wt Readings from Last 1 Encounters:  09/08/18 86.9 kg   Wt Readings from Last 10 Encounters:  09/08/18 86.9 kg  05/14/12 95.2 kg  05/05/12 95.3 kg  04/27/12 93 kg   Ideal Body Weight:  72.72 kg  BMI:  Body mass index is 28.29 kg/m.  Estimated Nutritional Needs:  Kcal:  1900-2100 (22-24 kcals/kg bw) Protein:  95-109g Pro (1.3-1.5g/kg bw) Fluid:  1.9-2.1 L fluid (1 ml/kcal)  Burtis Junes RD, LDN, CNSC Clinical Nutrition Available Tues-Sat via Pager: [phone] 09/09/2018 1:24 PM'

## 2018-09-10 ENCOUNTER — Encounter (HOSPITAL_COMMUNITY)
Admission: EM | Disposition: A | Discharge status: Home / Self Care | Payer: Self-pay | Source: Home / Self Care | Attending: Family Medicine

## 2018-09-10 ENCOUNTER — Encounter (HOSPITAL_COMMUNITY): Payer: Self-pay | Admitting: Anesthesiology

## 2018-09-10 ENCOUNTER — Inpatient Hospital Stay (HOSPITAL_COMMUNITY): Payer: Medicare Other | Admitting: Anesthesiology

## 2018-09-10 DIAGNOSIS — M869 Osteomyelitis, unspecified: Secondary | ICD-10-CM

## 2018-09-10 HISTORY — PX: AMPUTATION TOE: SHX6595

## 2018-09-10 LAB — CBC WITH DIFFERENTIAL/PLATELET
Abs Immature Granulocytes: 0.12 10*3/uL — ABNORMAL HIGH (ref 0.00–0.07)
Basophils Absolute: 0.1 10*3/uL (ref 0.0–0.1)
Basophils Relative: 0 %
Eosinophils Absolute: 0 10*3/uL (ref 0.0–0.5)
Eosinophils Relative: 0 %
HCT: 39.8 % (ref 39.0–52.0)
Hemoglobin: 12.8 g/dL — ABNORMAL LOW (ref 13.0–17.0)
Immature Granulocytes: 1 %
Lymphocytes Relative: 11 %
Lymphs Abs: 2.4 10*3/uL (ref 0.7–4.0)
MCH: 29.3 pg (ref 26.0–34.0)
MCHC: 32.2 g/dL (ref 30.0–36.0)
MCV: 91.1 fL (ref 80.0–100.0)
Monocytes Absolute: 1.7 10*3/uL — ABNORMAL HIGH (ref 0.1–1.0)
Monocytes Relative: 8 %
Neutro Abs: 17.9 10*3/uL — ABNORMAL HIGH (ref 1.7–7.7)
Neutrophils Relative %: 80 %
Platelets: 442 10*3/uL — ABNORMAL HIGH (ref 150–400)
RBC: 4.37 MIL/uL (ref 4.22–5.81)
RDW: 13.6 % (ref 11.5–15.5)
WBC: 22.1 10*3/uL — ABNORMAL HIGH (ref 4.0–10.5)
nRBC: 0 % (ref 0.0–0.2)

## 2018-09-10 LAB — GLUCOSE, CAPILLARY
Glucose-Capillary: 115 mg/dL — ABNORMAL HIGH (ref 70–99)
Glucose-Capillary: 118 mg/dL — ABNORMAL HIGH (ref 70–99)
Glucose-Capillary: 109 mg/dL — ABNORMAL HIGH (ref 70–99)
Glucose-Capillary: 112 mg/dL — ABNORMAL HIGH (ref 70–99)
Glucose-Capillary: 167 mg/dL — ABNORMAL HIGH (ref 70–99)
Glucose-Capillary: 229 mg/dL — ABNORMAL HIGH (ref 70–99)

## 2018-09-10 LAB — BASIC METABOLIC PANEL
Anion gap: 13 (ref 5–15)
BUN: 29 mg/dL — ABNORMAL HIGH (ref 8–23)
CO2: 26 mmol/L (ref 22–32)
Calcium: 8.9 mg/dL (ref 8.9–10.3)
Chloride: 99 mmol/L (ref 98–111)
Creatinine, Ser: 1.03 mg/dL (ref 0.61–1.24)
GFR calc Af Amer: >60 mL/min (ref >60)
GFR calc non Af Amer: >60 mL/min (ref >60)
Glucose, Bld: 112 mg/dL — ABNORMAL HIGH (ref 70–99)
Potassium: 4.1 mmol/L (ref 3.5–5.1)
Sodium: 138 mmol/L (ref 135–145)

## 2018-09-10 LAB — MAGNESIUM: Magnesium: 2.2 mg/dL (ref 1.7–2.4)

## 2018-09-10 SURGERY — AMPUTATION, TOE
Anesthesia: General | Site: Foot | Laterality: Left

## 2018-09-10 MED ORDER — EPHEDRINE SULFATE 50 MG/ML IJ SOLN
INTRAMUSCULAR | Status: DC | PRN
  Administered 2018-09-10 (×2): 10 mg via INTRAVENOUS

## 2018-09-10 MED ORDER — PROPOFOL 10 MG/ML IV BOLUS
INTRAVENOUS | Status: AC
  Filled 2018-09-10: qty 40

## 2018-09-10 MED ORDER — FENTANYL CITRATE (PF) 100 MCG/2ML IJ SOLN
INTRAMUSCULAR | Status: AC
  Filled 2018-09-10: qty 2

## 2018-09-10 MED ORDER — LIDOCAINE HCL (PF) 1 % IJ SOLN
INTRAMUSCULAR | Status: AC
  Filled 2018-09-10: qty 30

## 2018-09-10 MED ORDER — ROCURONIUM BROMIDE 10 MG/ML (PF) SYRINGE
PREFILLED_SYRINGE | INTRAVENOUS | Status: AC
  Filled 2018-09-10: qty 10

## 2018-09-10 MED ORDER — ONDANSETRON HCL 4 MG/2ML IJ SOLN
4.0000 mg | Freq: Once | INTRAMUSCULAR | Status: AC
  Administered 2018-09-10: 4 mg via INTRAVENOUS

## 2018-09-10 MED ORDER — GLYCOPYRROLATE PF 0.2 MG/ML IJ SOSY
PREFILLED_SYRINGE | INTRAMUSCULAR | Status: DC | PRN
  Administered 2018-09-10: .2 mg via INTRAVENOUS

## 2018-09-10 MED ORDER — HYDROMORPHONE HCL 1 MG/ML IJ SOLN: 0.2500 mg | INTRAMUSCULAR | Status: DC | PRN

## 2018-09-10 MED ORDER — MORPHINE SULFATE (PF) 2 MG/ML IV SOLN: 2.0000 mg | INTRAVENOUS | Status: DC | PRN

## 2018-09-10 MED ORDER — BUPIVACAINE HCL (PF) 0.5 % IJ SOLN
INTRAMUSCULAR | Status: AC
  Filled 2018-09-10: qty 30

## 2018-09-10 MED ORDER — LACTATED RINGERS IV SOLN: INTRAVENOUS | Status: DC

## 2018-09-10 MED ORDER — FENTANYL CITRATE (PF) 100 MCG/2ML IJ SOLN
INTRAMUSCULAR | Status: DC | PRN
  Administered 2018-09-10 (×3): 50 ug via INTRAVENOUS

## 2018-09-10 MED ORDER — PROMETHAZINE HCL 25 MG/ML IJ SOLN: 6.2500 mg | INTRAMUSCULAR | Status: DC | PRN

## 2018-09-10 MED ORDER — GLYCOPYRROLATE PF 0.2 MG/ML IJ SOSY
PREFILLED_SYRINGE | INTRAMUSCULAR | Status: AC
  Filled 2018-09-10: qty 1

## 2018-09-10 MED ORDER — SODIUM CHLORIDE 0.9 % IR SOLN
Status: DC | PRN
  Administered 2018-09-10: 1000 mL

## 2018-09-10 MED ORDER — DEXAMETHASONE SODIUM PHOSPHATE 10 MG/ML IJ SOLN
INTRAMUSCULAR | Status: AC
  Filled 2018-09-10: qty 1

## 2018-09-10 MED ORDER — GABAPENTIN 100 MG PO CAPS
100.0000 mg | ORAL_CAPSULE | Freq: Three times a day (TID) | ORAL | Status: DC
  Administered 2018-09-10 – 2018-09-11 (×3): 100 mg via ORAL
  Filled 2018-09-10 (×3): qty 1

## 2018-09-10 MED ORDER — LIDOCAINE 2% (20 MG/ML) 5 ML SYRINGE
INTRAMUSCULAR | Status: AC
  Filled 2018-09-10: qty 5

## 2018-09-10 MED ORDER — BUPIVACAINE HCL (PF) 0.5 % IJ SOLN
INTRAMUSCULAR | Status: DC | PRN
  Administered 2018-09-10: 6 mL
  Administered 2018-09-10: 4 mL

## 2018-09-10 MED ORDER — OXYCODONE-ACETAMINOPHEN 5-325 MG PO TABS
1.0000 | ORAL_TABLET | ORAL | Status: DC | PRN
  Administered 2018-09-10: 2 via ORAL
  Administered 2018-09-11 (×2): 1 via ORAL
  Administered 2018-09-11: 2 via ORAL
  Filled 2018-09-10: qty 1
  Filled 2018-09-10 (×3): qty 2

## 2018-09-10 MED ORDER — DEXAMETHASONE SODIUM PHOSPHATE 4 MG/ML IJ SOLN
INTRAMUSCULAR | Status: DC | PRN
  Administered 2018-09-10: 8 mg via INTRAVENOUS

## 2018-09-10 MED ORDER — HYDROCODONE-ACETAMINOPHEN 7.5-325 MG PO TABS: 1.0000 | ORAL_TABLET | Freq: Once | ORAL | Status: DC | PRN

## 2018-09-10 MED ORDER — LIDOCAINE 2% (20 MG/ML) 5 ML SYRINGE
INTRAMUSCULAR | Status: DC | PRN
  Administered 2018-09-10: 40 mg via INTRAVENOUS

## 2018-09-10 MED ORDER — PROPOFOL 10 MG/ML IV BOLUS
INTRAVENOUS | Status: DC | PRN
  Administered 2018-09-10: 40 mg via INTRAVENOUS
  Administered 2018-09-10: 130 mg via INTRAVENOUS
  Administered 2018-09-10: 20 mg via INTRAVENOUS
  Administered 2018-09-10: 30 mg via INTRAVENOUS

## 2018-09-10 MED ORDER — MEPERIDINE HCL 50 MG/ML IJ SOLN: 6.2500 mg | INTRAMUSCULAR | Status: DC | PRN

## 2018-09-10 MED ORDER — SUCCINYLCHOLINE CHLORIDE 200 MG/10ML IV SOSY
PREFILLED_SYRINGE | INTRAVENOUS | Status: AC
  Filled 2018-09-10: qty 10

## 2018-09-10 SURGICAL SUPPLY — 35 items
BANDAGE ELASTIC 4 LF NS (GAUZE/BANDAGES/DRESSINGS) ×2 IMPLANT
BANDAGE ELASTIC 4 VELCRO NS (GAUZE/BANDAGES/DRESSINGS) ×2 IMPLANT
BNDG GAUZE ELAST 4 BULKY (GAUZE/BANDAGES/DRESSINGS) ×2 IMPLANT
CLOTH BEACON ORANGE TIMEOUT ST (SAFETY) ×3 IMPLANT
COVER LIGHT HANDLE STERIS (MISCELLANEOUS) ×6 IMPLANT
COVER WAND RF STERILE (DRAPES) ×2 IMPLANT
DRSG XEROFORM 1X8 (GAUZE/BANDAGES/DRESSINGS) ×2 IMPLANT
ELECT REM PT RETURN 9FT ADLT (ELECTROSURGICAL) ×3
ELECTRODE REM PT RTRN 9FT ADLT (ELECTROSURGICAL) ×1 IMPLANT
GAUZE PETROLATUM 1 X8 (GAUZE/BANDAGES/DRESSINGS) ×2 IMPLANT
GAUZE SPONGE 4X4 12PLY STRL (GAUZE/BANDAGES/DRESSINGS) ×3 IMPLANT
GLOVE BIO SURGEON STRL SZ 6.5 (GLOVE) ×2 IMPLANT
GLOVE BIO SURGEONS STRL SZ 6.5 (GLOVE) ×1
GLOVE BIOGEL PI IND STRL 6.5 (GLOVE) ×1 IMPLANT
GLOVE BIOGEL PI IND STRL 7.0 (GLOVE) ×1 IMPLANT
GLOVE BIOGEL PI INDICATOR 6.5 (GLOVE) ×2
GLOVE BIOGEL PI INDICATOR 7.0 (GLOVE) ×4
GOWN STRL REUS W/TWL LRG LVL3 (GOWN DISPOSABLE) ×7 IMPLANT
INST SET MINOR BONE (KITS) ×3 IMPLANT
KIT TURNOVER KIT A (KITS) ×3 IMPLANT
MANIFOLD NEPTUNE II (INSTRUMENTS) ×3 IMPLANT
NDL HYPO 25X1 1.5 SAFETY (NEEDLE) IMPLANT
NEEDLE HYPO 25X1 1.5 SAFETY (NEEDLE) ×3 IMPLANT
NS IRRIG 1000ML POUR BTL (IV SOLUTION) ×3 IMPLANT
PACK BASIC LIMB (CUSTOM PROCEDURE TRAY) ×3 IMPLANT
PAD ARMBOARD 7.5X6 YLW CONV (MISCELLANEOUS) ×3 IMPLANT
SET BASIN LINEN APH (SET/KITS/TRAYS/PACK) ×3 IMPLANT
SOL PREP PROV IODINE SCRUB 4OZ (MISCELLANEOUS) ×3 IMPLANT
SPONGE LAP 18X18 RF (DISPOSABLE) ×3 IMPLANT
SUT ETHILON 3 0 FSL (SUTURE) IMPLANT
SUT PROLENE 2 0 FS (SUTURE) ×4 IMPLANT
SUT VIC AB 3-0 SH 27 (SUTURE)
SUT VIC AB 3-0 SH 27XBRD (SUTURE) ×1 IMPLANT
SUT VICRYL 0 27 CT2 27 ABS (SUTURE) ×6 IMPLANT
SYR CONTROL 10ML LL (SYRINGE) ×2 IMPLANT

## 2018-09-10 NOTE — Anesthesia Procedure Notes (Signed)
Procedure Name: Intubation Date/Time: 09/10/2018 9:54 AM Performed by: Andree Elk, Tashae Inda A, CRNA Pre-anesthesia Checklist: Patient identified, Patient being monitored, Timeout performed, Emergency Drugs available and Suction available Patient Re-evaluated:Patient Re-evaluated prior to induction Oxygen Delivery Method: Circle system utilized Preoxygenation: Pre-oxygenation with 100% oxygen Induction Type: IV induction, Rapid sequence and Cricoid Pressure applied Laryngoscope Size: Mac and 3 Grade View: Grade I Tube type: Oral Tube size: 7.0 mm Number of attempts: 1 Airway Equipment and Method: Stylet Placement Confirmation: ETT inserted through vocal cords under direct vision,  positive ETCO2 and breath sounds checked- equal and bilateral Secured at: 21 cm Tube secured with: Tape Dental Injury: Teeth and Oropharynx as per pre-operative assessment

## 2018-09-10 NOTE — Op Note (Signed)
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 Constance Haw, MD   Assistants: No qualified resident was available    Anesthesia: General endotracheal   Anesthesiologist: Dr. Currie Paris, MD    Specimens: 3rd toe; proximal phalanx of 3rd toe; 2nd toe   Estimated Blood Loss: Minimal   Blood Replacement: None    Complications: None   Wound Class: Dirty/ Infected    Operative Indications: Thomas Garcia is a 78 yo with a history of foot pain and ulceration/ infection for several months. He presented to the ED with swollen and ulcerated toes and findings of wet gangrene on my exam. He had MRI that demonstrated osteomyelitis of the 3rd toe and possible osteomyelitis of the 2nd toe. He also had ABIs that demonstrated moderate to severe disease. He has had diabetes for years and has not been compliant with medication. He reports that he has been on and off oral medications.  We discussed the wet gangrene and infection of the bone of the 3rd toe and the possible infection of the bone of the second toe. We discussed the risk of surgery including but not limited to bleeding, infection, need for more surgery, poor healing, need for vascular intervention/ revascularization, and possibility of having more amputations in the future. We discussed function of his foot and that he is independent, lives alone, and drives. We will try to preserve as much function as possible.    Findings: 3rd toe wet gangrene; 2nd toe exposed intra-phalanx joint at site of ulceration   Procedure: The patient was taken to the operating room and placed supine.  General endotracheal anesthesia was induced. Intravenous antibiotics were administered per protocol.  The left foot was prepped and draped in the normal sterile fashion.     I inspected the 3rd digit and it was clearly wet gangrene and cannot be salvaged. The 2nd toe ulceration at the level of the joint has exposed intraphalanx joint.  Given this I planned for a 3rd toe amputation and a partial 2nd toe amputation in order to attempt to preserve function and prevent valgus deformity of the great toe.   A fish mouth incision was made at the level of the proximal phalanx metatarsal head, and carried down through to the subcutaneous tissue. This level was the level at which the skin appeared viable. The incision was deepened down to the bone and tendinous attachments were divided.  The periosteum overlying the proximal phalanx was elevated and the bone was transected at the proximal phalanx level as I was having difficulty with disarticulation.  After this, I was able to further disarticulate the proximal phalanx.  These two specimen were sent separately.  Following this, the 2nd toe was elevated. The ulceration and exposed joint was at the level of the proximal and mid joint space.  I performed a fish mouth incision just proximal to this on viable skin. The periosteal elevate was used, and the proximal phalanx was transected mid phalanx in order to attempt to preserve the position of the great toe and prevent valgus deformation. The edges were cleaned up and smoothed. Both wounds were copious irrigated. The entire foot was cleaned with a scrub  brush and betadine and washed with saline.  There was some minor bleeding of the tissue.  Hemostasis was achieved. The 3rd digit wound deep space was closed with 0 Vicryl suture. Pulling the tendon over the metatarsal head and closing as much of the subcutaneous tissue over this bone.    The 2nd digit wound was closed with 0 Vicryl interrupted deep and 2-0 Prolene interrupted  to close the fish mouth.   Gauze packing was placed in the 3rd digit wound and a xeroform gauze was placed over the 2nd digit. Gauze was placed between the remaining toes and fluffed gauze and kerlix was wrapped around the wound. An Ace wrap was applied.   Final inspection revealed acceptable hemostasis. All counts were correct at the end of the case. The patient was awakened from anesthesia and extubated without complication.  The patient went to the PACU in stable condition.   Curlene Labrum, MD [facility] [address] [place], [place] [address] [phone] (office)

## 2018-09-10 NOTE — TOC Initial Note (Signed)
Transition of Care [facility]) - Initial/Assessment Note    Patient Details  Name: Thomas Garcia MRN: [id number] Date of Birth: 10/14/1940  Transition of Care [facility]) CM/SW Contact:    Boneta Lucks, RN Phone Number: 09/10/2018, 3:24 PM  Clinical Narrative:       Admitted for osteomyelitis, Patient lives at home alone, brother lives next door and Son close by to take him to appointments. He has a vehicle but can not drive due to stick shift. Discussed SNF and HH with patient he rather go home and follow up with PCP. Patient will wait for PT assessment and discuss in the morning.         Expected Discharge Plan: Home/Self Care Barriers to Discharge: Continued Medical Work up   Patient Goals and CMS Choice        Expected Discharge Plan and Services Expected Discharge Plan: Home/Self Care       Living arrangements for the past 2 months: Single Family Home Expected Discharge Date: 09/10/18                         HH Arranged: Refused HH, Refused SNF          Prior Living Arrangements/Services Living arrangements for the past 2 months: Single Family Home Lives with:: Self   Do you feel safe going back to the place where you live?: Yes      Need for Family Participation in Patient Care: Yes (Comment) Care giver support system in place?: Yes (comment)   Criminal Activity/Legal Involvement Pertinent to Current Situation/Hospitalization: No - Comment as needed  Activities of Daily Living Home Assistive Devices/Equipment: None ADL Screening (condition at time of admission) Patient's cognitive ability adequate to safely complete daily activities?: Yes Is the patient deaf or have difficulty hearing?: No Does the patient have difficulty seeing, even when wearing glasses/contacts?: No Does the patient have difficulty concentrating, remembering, or making decisions?: No Patient able to express need for assistance with ADLs?: No Does the patient have difficulty dressing or  bathing?: No Independently performs ADLs?: Yes (appropriate for developmental age) Does the patient have difficulty walking or climbing stairs?: No Weakness of Legs: None Weakness of Arms/Hands: None  Permission Sought/Granted Permission sought to share information with : Case Manager                Emotional Assessment     Affect (typically observed): Calm, Hopeful Orientation: : Oriented to Self, Oriented to  Time, Oriented to Place, Oriented to Situation Alcohol / Substance Use: Not Applicable Psych Involvement: No (comment)  Admission diagnosis:  PVD (peripheral vascular disease) ([place]) [I73.9] Patient Active Problem List   Diagnosis Date Noted  . 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[place])   . PVD (peripheral vascular disease) ([place])   . 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 Osteomyelitis of second toe of left foot ([place]) 09/08/2018  . Cellulitis of left foot 09/08/2018  . Leukocytosis 09/08/2018  . Fever and chills 09/08/2018  . MOLE 10/26/2008  . 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 OVERWEIGHT 10/26/2008  . NICOTINE ADDICTION 10/26/2008  . ACUTE CYSTITIS 10/26/2008  . FATIGUE 10/26/2008  . COUGH 10/26/2008  . ELECTROCARDIOGRAM, ABNORMAL 10/26/2008   PCP:  Fayrene Helper, MD Pharmacy:   [facility] [address], [place] - [place] [address] [place] [address] [place] [address] Phone: [phone] Fax: [phone]  Readmission Risk Interventions No flowsheet data found.

## 2018-09-10 NOTE — Interval H&P Note (Signed)
History and Physical Interval Note:  09/10/2018 9:42 AM  Thomas Garcia  has presented today for surgery, with the diagnosis of osteomyelitis ,wet gangrene.  The various methods of treatment have been discussed with the patient and family. After consideration of risks, benefits and other options for treatment, the patient has consented to  Procedure(s): AMPUTATION TOE 3rd toe with debridement of 2nd toe and possible amputation of 2nd toe (Left) as a surgical intervention.  The patient's history has been reviewed, patient examined, no change in status, stable for surgery.  I have reviewed the patient's chart and labs.  Questions were answered to the patient's satisfaction.     No questions. Patient saying he has pain in the 1st toe and in the bottom of the foot. Discussed possibility of gabapentin to help with this. Do not think amputation of these is appropriate at this time but it is possible that this could be necessary in the future for a transmetatarsal versus a BKA once he is revascularized and pending his healing.  Virl Cagey

## 2018-09-10 NOTE — Care Management Important Message (Signed)
Important Message  Patient Details  Name: Thomas Garcia MRN: [id number] Date of Birth: 03-Dec-1940   Medicare Important Message Given:  Yes    [name] 09/10/2018, 2:15 PM

## 2018-09-10 NOTE — Progress Notes (Signed)
PROGRESS NOTE    Thomas Garcia  ONG:[id number] DOB: 1940/11/04 DOA: 09/08/2018 PCP: Fayrene Helper, MD   Brief Narrative:  78 year old male smoker with type 2 diabetes mellitus presented with worsening six-month infection involving the left foot and second and third toes.  He was found to have osteomyelitis in that left foot.  He was admitted for IV antibiotics and surgery consultation.  He has now undergone left third toe and left partial second toe amputation.  He appears to have infection with Morganella that is currently susceptible to IV Rocephin.  Blood cultures with no growth in the last 2 days noted.  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 Cellulitis and osteomyelitis of the left foot with Morganella. ABI studies reveal moderate PVD in the lower extremity.  Patient has been seen by surgery and they are recommending operative management 09/10/2018.  The patient is having a preop echocardiogram done.  Patient consents to have surgery.  Patient is susceptible to Rocephin which she is currently on and will continue for now until discharge on oral antibiotics. 2. Type 2 diabetes mellitus with hyperglycemia- poorly controlled as evidenced by an hemoglobin A1c of 9.0.  The patient is on basal bolus insulin and his blood sugars remain controlled at this time.  Continue to monitor CBG 5 times per day. 3. Leukocytosis - WBC continues to remain elevated.  Continue current antibiotics. 4. Nicotine addiction - Counseled patient to stop using tobacco products.  Offered nicotine patch while in hospital.  5. Essential hypertension - His blood pressure is better  controlled today.  Continue amlodipine 5 mg daily, add lisinopril 5 mg daily.   DVT prophylaxis: heparin Code Status: Full  Family Communication:  Dr. Constance Haw had updated son Disposition Plan: continue inpatient hospital treatments and plan for discharge in a.m. if stable.  Will need outpatient follow-up to vascular surgery for PAD.  Consultants:  Surgery Dr. Constance Haw   Procedures:  Left third toe full toe and left second toe partial amputation on 6/3  Echocardiogram: 09/09/18 IMPRESSIONS 1. The left ventricle has normal systolic function with an ejection fraction of 60-65%. The cavity size was normal. There is mild concentric left ventricular hypertrophy. Left ventricular diastolic Doppler parameters are consistent with impaired  relaxation. Indeterminate filling pressures No evidence of left ventricular regional wall motion abnormalities. 2. The right ventricle has normal systolic function. The cavity was normal. There is no increase in right ventricular wall thickness. 3. The mitral valve is grossly normal. There is moderate mitral annular calcification present. 4. The tricuspid valve is grossly normal. 5. The aortic valve is tricuspid. 6. The aortic root is normal in size and structure.  Antimicrobials:  Ceftriaxone 6/2 >  Metronidazole 6/2 >  Subjective: Patient seen and evaluated today with no new acute complaints or concerns. No acute concerns or events noted overnight.  He is status post left toe amputation and denies any significant pain.  Somewhat somnolent from anesthesia.  Objective: Vitals:   09/10/18 1115 09/10/18 1130 09/10/18 1145 09/10/18 1221  BP: (!) 122/48 (!) 120/48 (!) 121/52 (!) 125/55  Pulse: 89 87 87 85  Resp: 18 16 15 18   Temp:    97.7 F (36.5 C)  TempSrc:    Oral  SpO2: 100% 100% 92% 93%  Weight:      Height:        Intake/Output Summary (Last 24 hours) at 09/10/2018 1246 Last data filed at 09/10/2018 1115 Gross per 24 hour  Intake  1680 ml  Output 6 ml  Net 1674 ml   Filed Weights   09/08/18 0941 09/08/18 1538  Weight: 86.2 kg 86.9 kg    Examination:  General exam: Appears calm and comfortable, somnolent Respiratory system: Clear to auscultation. Respiratory effort normal. Cardiovascular system: S1 & S2 heard, RRR. No JVD, murmurs, rubs, gallops or clicks. No pedal edema. Gastrointestinal system: Abdomen is nondistended, soft and nontender. No organomegaly or masses felt. Normal bowel sounds heard. Central nervous system: Arousable but somnolent. Extremities: Left lower extremity in clean dry and intact dressings. Skin: No rashes, lesions or ulcers Psychiatry: Judgement and insight appear normal. Mood & affect appropriate.     Data Reviewed: I have personally reviewed following labs and imaging studies  CBC: Recent Labs  Lab 09/08/18 1124 09/09/18 0606 09/10/18 0348  WBC 20.0* 17.2* 22.1*  NEUTROABS 17.0* 14.0* 17.9*  HGB 13.6 12.3* 12.8*  HCT 42.1 37.5* 39.8  MCV 91.5 90.1 91.1  PLT 392 415* 465*   Basic Metabolic Panel: Recent Labs  Lab 09/08/18 1124 09/09/18 0606 09/10/18 0348  NA 136 136 138  K 4.4 3.9 4.1  CL 98 101 99  CO2 25 23 26   GLUCOSE 204* 153* 112*  BUN 14 17 29*  CREATININE 0.71 0.74 1.03  CALCIUM 8.9 8.3* 8.9  MG  --   --  2.2   GFR: Estimated Creatinine Clearance: 66.6 mL/min (by C-G formula based on SCr of 1.03 mg/dL). Liver Function Tests: Recent Labs  Lab 09/08/18 1124  AST 11*  ALT 12  ALKPHOS 95  BILITOT 0.7  PROT 7.6  ALBUMIN 3.3*   No results for input(s): LIPASE, AMYLASE in the last 168 hours. No results for input(s): AMMONIA in the last 168 hours. Coagulation Profile: Recent Labs  Lab 09/08/18 1124  INR 1.1   Cardiac Enzymes: No results for input(s): CKTOTAL, CKMB, CKMBINDEX, TROPONINI in the last 168 hours. BNP (last 3 results) No results for input(s): PROBNP in the last 8760 hours. HbA1C: Recent Labs    09/08/18 1124  HGBA1C 9.0*    CBG: Recent Labs  Lab 09/09/18 1610 09/09/18 2118 09/10/18 0259 09/10/18 0717 09/10/18 0842  GLUCAP 134* 125* 115* 118* 109*   Lipid Profile: No results for input(s): CHOL, HDL, LDLCALC, TRIG, CHOLHDL, LDLDIRECT in the last 72 hours. Thyroid Function Tests: No results for input(s): TSH, T4TOTAL, FREET4, T3FREE, THYROIDAB in the last 72 hours. Anemia Panel: No results for input(s): VITAMINB12, FOLATE, FERRITIN, TIBC, IRON, RETICCTPCT in the last 72 hours. Sepsis Labs: Recent Labs  Lab 09/08/18 1124 09/08/18 1412  LATICACIDVEN 1.3 1.3    Recent Results (from the past 240 hour(s))  Wound or Superficial Culture     Status: None (Preliminary result)   Collection Time: 09/08/18 11:13 AM  Result Value Ref Range Status   Specimen Description   Final    FOOT TOE Performed at [facility], [address], [place], [place] [address]    Special Requests   Final    NONE Performed at [facility], [address] [place], [place] [address]    Gram Stain   Final  MODERATE WBC PRESENT,BOTH PMN AND MONONUCLEAR ABUNDANT GRAM NEGATIVE RODS ABUNDANT GRAM POSITIVE COCCI    Culture   Final    ABUNDANT MORGANELLA MORGANII CULTURE REINCUBATED FOR BETTER GROWTH Performed at [facility], [place] [address], [place], [place] [address]    Report Status PENDING  Incomplete   Organism ID, Bacteria MORGANELLA MORGANII  Final      Susceptibility   Morganella morganii - MIC*    AMPICILLIN >=32 RESISTANT Resistant     CEFAZOLIN >=64 RESISTANT Resistant     CEFEPIME <=1 SENSITIVE Sensitive     CEFTAZIDIME <=1 SENSITIVE Sensitive     CEFTRIAXONE <=1 SENSITIVE Sensitive     CIPROFLOXACIN <=0.25 SENSITIVE Sensitive     GENTAMICIN <=1 SENSITIVE Sensitive     IMIPENEM 1 SENSITIVE Sensitive     TRIMETH/SULFA <=20 SENSITIVE Sensitive     AMPICILLIN/SULBACTAM 16 INTERMEDIATE Intermediate     PIP/TAZO <=4 SENSITIVE Sensitive     * ABUNDANT MORGANELLA MORGANII  Culture, blood (Routine x 2)      Status: None (Preliminary result)   Collection Time: 09/08/18 11:25 AM  Result Value Ref Range Status   Specimen Description RIGHT ANTECUBITAL  Final   Special Requests   Final    BOTTLES DRAWN AEROBIC AND ANAEROBIC Blood Culture adequate volume   Culture   Final    NO GROWTH 2 DAYS Performed at [facility], [address], [place], [place] [address]    Report Status PENDING  Incomplete  Culture, blood (Routine x 2)     Status: None (Preliminary result)   Collection Time: 09/08/18 11:25 AM  Result Value Ref Range Status   Specimen Description LEFT ANTECUBITAL  Final   Special Requests   Final    BOTTLES DRAWN AEROBIC AND ANAEROBIC Blood Culture adequate volume   Culture   Final    NO GROWTH 2 DAYS Performed at [facility], [address], [place], [place] [address]    Report Status PENDING  Incomplete  SARS Coronavirus 2 (CEPHEID - Performed in [place] hospital lab), Hosp Order     Status: None   Collection Time: 09/08/18 11:48 AM  Result Value Ref Range Status   SARS Coronavirus 2 NEGATIVE NEGATIVE Final    Comment: (NOTE) If result is NEGATIVE SARS-CoV-2 target nucleic acids are NOT DETECTED. The SARS-CoV-2 RNA is generally detectable in upper and lower  respiratory specimens during the acute phase of infection. The lowest  concentration of SARS-CoV-2 viral copies this assay can detect is 250  copies / mL. A negative result does not preclude SARS-CoV-2 infection  and should not be used as the sole basis for treatment or other  patient management decisions.  A negative result may occur with  improper specimen collection / handling, submission of specimen other  than nasopharyngeal swab, presence of viral mutation(s) within the  areas targeted by this assay, and inadequate number of viral copies  (<250 copies / mL). A negative result must be combined with clinical  observations, patient history, and epidemiological information. If result is POSITIVE SARS-CoV-2 target  nucleic acids are DETECTED. The SARS-CoV-2 RNA is generally detectable in upper and lower  respiratory specimens dur ing the acute phase of infection.  Positive  results are indicative of active infection with SARS-CoV-2.  Clinical  correlation with patient history and other diagnostic information is  necessary to determine patient infection status.  Positive results do  not rule out bacterial infection or co-infection with other viruses. If result is  PRESUMPTIVE POSTIVE SARS-CoV-2 nucleic acids MAY BE PRESENT.   A presumptive positive result was obtained on the submitted specimen  and confirmed on repeat testing.  While 2019 novel coronavirus  (SARS-CoV-2) nucleic acids may be present in the submitted sample  additional confirmatory testing may be necessary for epidemiological  and / or clinical management purposes  to differentiate between  SARS-CoV-2 and other Sarbecovirus currently known to infect humans.  If clinically indicated additional testing with an alternate test  methodology [phone]) is advised. The SARS-CoV-2 RNA is generally  detectable in upper and lower respiratory sp ecimens during the acute  phase of infection. The expected result is Negative. Fact Sheet for Patients:  StrictlyIdeas.no Fact Sheet for Healthcare Providers: BankingDealers.co.za This test is not yet approved or cleared by the [place] FDA and has been authorized for detection and/or diagnosis of SARS-CoV-2 by FDA under an Emergency Use Authorization (EUA).  This EUA will remain in effect (meaning this test can be used) for the duration of the COVID-19 declaration under Section 564(b)(1) of the Act, 21 U.S.C. section 360bbb-3(b)(1), unless the authorization is terminated or revoked sooner. Performed at [facility], [address], [place], [place] [address]   Surgical PCR screen     Status: None   Collection Time: 09/09/18  4:10 PM  Result Value  Ref Range Status   MRSA, PCR NEGATIVE NEGATIVE Final   Staphylococcus aureus NEGATIVE NEGATIVE Final    Comment: (NOTE) The Xpert SA Assay (FDA approved for NASAL specimens in patients 18 years of age and older), is one component of a comprehensive surveillance program. It is not intended to diagnose infection nor to guide or monitor treatment. Performed at [facility], [address], [place], [place] [address]          Radiology Studies: Mri Left Foot Without Contrast  Result Date: 09/08/2018 CLINICAL DATA:  Left foot wound with swelling for 6 months. Diabetes. Osteomyelitis suspected. EXAM: MRI OF THE LEFT FOOT WITHOUT CONTRAST TECHNIQUE: Multiplanar, multisequence MR imaging of the left forefoot was performed. No intravenous contrast was administered. COMPARISON:  Radiographs same date FINDINGS: Despite efforts by the technologist and patient, mild motion artifact is present on today's exam and could not be eliminated. This reduces exam sensitivity and specificity. Bones/Joint/Cartilage Marrow assessment is limited by motion and incomplete fat saturation on the T2 weighted images. Sagittal inversion recovery images were obtained. There is cortical destruction with increased T2 and decreased T1 marrow signal in the head of the 3rd proximal phalanx, and probably the base of the 3rd middle phalanx, suspicious for osteomyelitis as correlated with the earlier radiographs. There is possible involvement of the 2nd metatarsal head as well, although this is less definitive. The 1st, 4th and 5th toes appear intact. There are mild degenerative changes at the 1st metatarsophalangeal joint. No significant joint effusions. The alignment is normal at the Lisfranc joint. Ligaments Intact Lisfranc ligament. The collateral ligaments of the metatarsophalangeal joints are intact. Muscles and Tendons Increased T2 signal throughout the forefoot musculature, most likely due to underlying diabetes. No focal fluid  collection or tenosynovitis. Soft tissues There is soft tissue swelling in the 3rd toe. In correlation with the earlier radiographs, there is soft tissue emphysema as well. There is possible mild soft tissue swelling in the 2nd toe. No focal fluid collections are identified. IMPRESSION: 1. In correlation with the earlier radiographs, the findings are highly suspicious for osteomyelitis involving the head of the 3rd proximal phalanx, and likely the 3rd middle  phalanx. 2. Possible osteomyelitis of the 2nd proximal phalangeal head. 3. Soft tissue swelling in the 3rd and 2nd toes without focal fluid collection. Electronically Signed   By: Richardean Sale M.D.   On: 09/08/2018 17:15   US Arterial Abi (screening Lower Extremity)  Result Date: 09/08/2018 CLINICAL DATA:  Peripheral vascular disease. Nonhealing left foot ulcer. Chronic osteomyelitis. Smoking history. EXAM: NONINVASIVE PHYSIOLOGIC VASCULAR STUDY OF BILATERAL LOWER EXTREMITIES TECHNIQUE: Evaluation of both lower extremities were performed at rest, including calculation of ankle-brachial indices with single level Doppler, pressure and pulse volume recording. COMPARISON:  None. FINDINGS: Right ABI:  0.51 Left ABI:  0.49 Right Lower Extremity: Monophasic and a few biphasic waveforms in the right ankle. Left Lower Extremity:  Irregular arterial waveforms in left ankle. 0.5-0.79 Moderate PAD IMPRESSION: Right ABI is 0.51 and left ABI is 0.49. Findings are suggestive for moderate to severe peripheral arterial disease. Electronically Signed   By: Markus Daft M.D.   On: 09/08/2018 16:29        Scheduled Meds:  amLODipine  5 mg Oral Daily   Chlorhexidine Gluconate Cloth  6 each Topical Once   feeding supplement (GLUCERNA SHAKE)  237 mL Oral BID BM   feeding supplement (PRO-STAT SUGAR FREE 64)  30 mL Oral BID   heparin injection (subcutaneous)  5,000 Units Subcutaneous Q8H   insulin aspart  0-15 Units Subcutaneous TID WC   insulin aspart  0-5  Units Subcutaneous QHS   insulin aspart  4 Units Subcutaneous TID WC   insulin glargine  10 Units Subcutaneous QHS   lisinopril  5 mg Oral Daily   multivitamin with minerals  1 tablet Oral Daily   mupirocin ointment  1 application Nasal BID   nicotine  21 mg Transdermal Daily   Continuous Infusions:  sodium chloride 1,000 mL (09/09/18 1803)   cefTRIAXone (ROCEPHIN)  IV 2 g (09/09/18 1723)   And   metronidazole 500 mg (09/10/18 0801)     LOS: 2 days    Time spent: 30 minutes    Chisom Aust Darleen Crocker, DO [facility] Pager [phone]  If 7PM-7AM, please contact night-coverage [email or web address] Password [facility] 09/10/2018, 12:46 PM

## 2018-09-10 NOTE — Anesthesia Postprocedure Evaluation (Signed)
Anesthesia Post Note Late Entry for 1133  Patient: [name]  Procedure(s) Performed: AMPUTATION OF THIRD TOE LEFT FOOT, DEBRIDEMENT OF ULCERS ON SECOND TOE AND PARTIAL AMPUTATION SECOND TOE ON LEFT FOOT (Left Foot)  Patient location during evaluation: PACU Anesthesia Type: General Level of consciousness: awake and oriented Pain management: pain level controlled Vital Signs Assessment: post-procedure vital signs reviewed and stable Respiratory status: spontaneous breathing Cardiovascular status: stable Postop Assessment: no apparent nausea or vomiting Anesthetic complications: no     Last Vitals:  Vitals:   09/10/18 1145 09/10/18 1221  BP: (!) 121/52 (!) 125/55  Pulse: 87 85  Resp: 15 18  Temp:  36.5 C  SpO2: 92% 93%    Last Pain:  Vitals:   09/10/18 1221  TempSrc: Oral  PainSc:                  ADAMS, AMY A

## 2018-09-10 NOTE — Progress Notes (Signed)
[facility]  L 3rd toe amputation and L 2nd toe partial amputation. Pain medication ordered, diet ordered, post op shoe ordered, PT for tomorrow. Added gabapentin for general foot pain he described. Continue IV antibiotics while inpatient.  Will need packing of the 3rd toe site as it was left open.  Will start this tomorrow.  Will need Vascular referral urgently as outpatient.  Will need home health for packing of the wound.   Updated Dr. Manuella Ghazi. Updated son, Legrand Como.   Curlene Labrum, MD [facility] [address] [place], [place] [address] [phone] (office)

## 2018-09-10 NOTE — Transfer of Care (Addendum)
Immediate Anesthesia Transfer of Care Note  Patient: Thomas Garcia  Procedure(s) Performed: AMPUTATION OF THIRD TOE LEFT FOOT, DEBRIDEMENT OF ULCERS ON SECOND TOE AND PARTIAL AMPUTATION SECOND TOE ON LEFT FOOT (Left Foot)  Patient Location: PACU  Anesthesia Type:General  Level of Consciousness: sedated and patient cooperative  Airway & Oxygen Therapy: Patient Spontanous Breathing and Patient connected to face mask oxygen  Post-op Assessment: Report given to RN and Post -op Vital signs reviewed and stable  Post vital signs: Reviewed and stable  Last Vitals:  Vitals Value Taken Time  BP 121/45 09/10/2018 11:10 AM  Temp    Pulse 89 09/10/2018 11:15 AM  Resp 18 09/10/2018 11:15 AM  SpO2 100 % 09/10/2018 11:15 AM  Vitals shown include unvalidated device data.  Last Pain:  Vitals:   09/10/18 0846  TempSrc:   PainSc: 9       Patients Stated Pain Goal: 0 (72/27/73 7505)  Complications: No apparent anesthesia complications

## 2018-09-10 NOTE — Anesthesia Preprocedure Evaluation (Signed)
Anesthesia Evaluation    Airway Mallampati: II       Dental  (+) Poor Dentition   Pulmonary Current Smoker,    breath sounds clear to auscultation       Cardiovascular + Peripheral Vascular Disease   Rhythm:regular     Neuro/Psych    GI/Hepatic   Endo/Other  diabetes  Renal/GU Renal disease     Musculoskeletal   Abdominal   Peds  Hematology   Anesthesia Other Findings DM2, PVD with osteomyelitis, wet gangrene and cellulitis Ongoing tobacco abuse No previous medical care with approx 9 yr h/o DM Denies any experience of CP or other CV disease  Reproductive/Obstetrics                             Anesthesia Physical Anesthesia Plan  ASA: III  Anesthesia Plan: General   Post-op Pain Management:    Induction:   PONV Risk Score and Plan:   Airway Management Planned:   Additional Equipment:   Intra-op Plan:   Post-operative Plan:   Informed Consent: I have reviewed the patients History and Physical, chart, labs and discussed the procedure including the risks, benefits and alternatives for the proposed anesthesia with the patient or authorized representative who has indicated his/her understanding and acceptance.       Plan Discussed with: Anesthesiologist  Anesthesia Plan Comments:         Anesthesia Quick Evaluation

## 2018-09-11 ENCOUNTER — Encounter (HOSPITAL_COMMUNITY): Payer: Self-pay | Admitting: General Surgery

## 2018-09-11 LAB — AEROBIC CULTURE W GRAM STAIN (SUPERFICIAL SPECIMEN)

## 2018-09-11 LAB — BASIC METABOLIC PANEL
Anion gap: 10 (ref 5–15)
BUN: 29 mg/dL — ABNORMAL HIGH (ref 8–23)
CO2: 27 mmol/L (ref 22–32)
Calcium: 8.7 mg/dL — ABNORMAL LOW (ref 8.9–10.3)
Chloride: 100 mmol/L (ref 98–111)
Creatinine, Ser: 0.93 mg/dL (ref 0.61–1.24)
GFR calc Af Amer: >60 mL/min (ref >60)
GFR calc non Af Amer: >60 mL/min (ref >60)
Glucose, Bld: 169 mg/dL — ABNORMAL HIGH (ref 70–99)
Potassium: 4.3 mmol/L (ref 3.5–5.1)
Sodium: 137 mmol/L (ref 135–145)

## 2018-09-11 LAB — CBC WITH DIFFERENTIAL/PLATELET
Abs Immature Granulocytes: 0.14 10*3/uL — ABNORMAL HIGH (ref 0.00–0.07)
Basophils Absolute: 0 10*3/uL (ref 0.0–0.1)
Basophils Relative: 0 %
Eosinophils Absolute: 0 10*3/uL (ref 0.0–0.5)
Eosinophils Relative: 0 %
HCT: 38.2 % — ABNORMAL LOW (ref 39.0–52.0)
Hemoglobin: 12.4 g/dL — ABNORMAL LOW (ref 13.0–17.0)
Immature Granulocytes: 1 %
Lymphocytes Relative: 8 %
Lymphs Abs: 1.8 10*3/uL (ref 0.7–4.0)
MCH: 29.7 pg (ref 26.0–34.0)
MCHC: 32.5 g/dL (ref 30.0–36.0)
MCV: 91.6 fL (ref 80.0–100.0)
Monocytes Absolute: 1.2 10*3/uL — ABNORMAL HIGH (ref 0.1–1.0)
Monocytes Relative: 5 %
Neutro Abs: 20.6 10*3/uL — ABNORMAL HIGH (ref 1.7–7.7)
Neutrophils Relative %: 86 %
Platelets: 470 10*3/uL — ABNORMAL HIGH (ref 150–400)
RBC: 4.17 MIL/uL — ABNORMAL LOW (ref 4.22–5.81)
RDW: 13.8 % (ref 11.5–15.5)
WBC: 23.8 10*3/uL — ABNORMAL HIGH (ref 4.0–10.5)
nRBC: 0 % (ref 0.0–0.2)

## 2018-09-11 LAB — GLUCOSE, CAPILLARY
Glucose-Capillary: 155 mg/dL — ABNORMAL HIGH (ref 70–99)
Glucose-Capillary: 119 mg/dL — ABNORMAL HIGH (ref 70–99)
Glucose-Capillary: 96 mg/dL (ref 70–99)

## 2018-09-11 MED ORDER — SULFAMETHOXAZOLE-TRIMETHOPRIM 800-160 MG PO TABS: 1.0000 | ORAL_TABLET | Freq: Two times a day (BID) | ORAL | 0 refills | Status: DC

## 2018-09-11 MED ORDER — OXYCODONE-ACETAMINOPHEN 5-325 MG PO TABS: 1.0000 | ORAL_TABLET | ORAL | 0 refills | Status: DC | PRN

## 2018-09-11 MED ORDER — CALCIUM CARBONATE ANTACID 500 MG PO CHEW
1.0000 | CHEWABLE_TABLET | Freq: Three times a day (TID) | ORAL | Status: DC
  Administered 2018-09-11: 200 mg via ORAL
  Filled 2018-09-11: qty 1

## 2018-09-11 MED ORDER — METFORMIN HCL 500 MG PO TABS: 500.0000 mg | ORAL_TABLET | Freq: Two times a day (BID) | ORAL | 11 refills | Status: DC

## 2018-09-11 MED ORDER — LISINOPRIL 5 MG PO TABS: 5.0000 mg | ORAL_TABLET | Freq: Every day | ORAL | 0 refills | Status: DC

## 2018-09-11 MED ORDER — GABAPENTIN 100 MG PO CAPS: 100.0000 mg | ORAL_CAPSULE | Freq: Three times a day (TID) | ORAL | 0 refills | Status: DC

## 2018-09-11 MED ORDER — AMLODIPINE BESYLATE 5 MG PO TABS: 5.0000 mg | ORAL_TABLET | Freq: Every day | ORAL | 0 refills | Status: DC

## 2018-09-11 NOTE — TOC Progression Note (Signed)
Transition of Care [facility]) - Progression Note    Patient Details  Name: THURMON MIZELL MRN: [id number] Date of Birth: [birth year]  Transition of Care [facility]) CM/SW Contact  Boneta Lucks, RN Phone Number: 09/11/2018, 10:36 AM  Clinical Narrative:   PT recommended rolling walker, referred to [facility]. Juliann Pulse will deliver today. Patient wishes to go home, he lives alone, has walking stick and supportive family.     Expected Discharge Plan: Home/Self Care Barriers to Discharge: No Barriers Identified  Expected Discharge Plan and Services Expected Discharge Plan: Home/Self Care   Discharge Planning Services: CM Consult Post Acute Care Choice: Durable Medical Equipment Living arrangements for the past 2 months: Mobile Home Expected Discharge Date: 09/10/18               DME Arranged: 3-N-1 DME Agency: [employer] Date DME Agency Contacted: 09/11/18 Time DME Agency Contacted: 0930   HH Arranged: Refused HH, Refused SNF               Readmission Risk Interventions No flowsheet data found.

## 2018-09-11 NOTE — Discharge Instructions (Signed)
Left Foot Amputation Site Care: Twice daily dressing changes.  2nd digit incision needs xeroform gauze over the incision.  3rd digit wound needs saline dampened gauze packed into the cavity.  Dry gauze over the area. Dry gauze between remaining toes.  Wrap with Kerlix and ACE wrap.  No soaking the foot.  You can clean the foot with a wash cloth soap/water daily. Dry well.   Dr. Sharlot Gowda Information: If you have questions or concerns, please call our office, [phone], Monday- Thursday 8AM-5PM and Friday 8AM-12Noon.  If it is after hours or on the weekend, please call [facility] Main Number, [phone], and ask to speak to the surgeon on call for Dr. Constance Haw at [facility].

## 2018-09-11 NOTE — Discharge Summary (Signed)
Physician Discharge Summary  VELDON WAGER QIW:[id number] DOB: Mar 19, 1941 DOA: 09/08/2018  PCP: Fayrene Helper, MD  Admit date: 09/08/2018  Discharge date: 09/11/2018  Admitted From:Home  Disposition:  Home  Recommendations for Outpatient Follow-up:  1. Follow up with PCP in 1-2 weeks 2. Called [facility] specialists in [place] who will call patient to arrange appointment ASAP to address revascularization for peripheral vascular disease 3. Follow-up with Dr. Constance Haw as scheduled on 6/9 for wound evaluation 4. Set up with home health for complex wound care 5. Continue on metformin and follow-up with PCP to monitor blood glucose readings as hemoglobin A1c noted to be 9%. 6. Continue on blood pressure agents as prescribed and follow-up with PCP 7. Remain on Bactrim as prescribed for 10 more days to complete a 14-day total course of treatment for Morganella infection  Home Health: Yes for wound care  Equipment/Devices: Rolling walker  Discharge Condition: Stable  CODE STATUS: Full  Diet recommendation: Heart Healthy/carb modified  Brief/Interim Summary: 78 year old male smoker with type 2 diabetes mellitus presented with worsening six-month infection involving the left foot and second and third toes.He was found to have osteomyelitis in that left foot. He was admitted for IV antibiotics and surgery consultation.  He has now undergone left third toe and left partial second toe amputation on 6/3.  He appears to have infection with Morganella that is currently susceptible to IV Rocephin and has received 2 days worth of this antibiotic as well as Flagyl.  Blood cultures with no growth in the last 2 days noted.  He will remain on Bactrim for the next 10 days to complete a total 14-day course of treatment.  He has been seen by general surgery today and deemed okay for discharge as long as he has complex wound care at home.  Dr. Constance Haw will follow-up in the outpatient setting on  6/9 as scheduled.  He will also be set up with vascular surgeon Dr. Carlis Abbott in the very near future and the practice will call the patient to schedule an appointment ASAP.  Patient was also noted to have elevated blood pressure readings while in the hospital and will be discharged on amlodipine as well as lisinopril.  Additionally, he is noted to have hyperglycemia with type 2 diabetes and hemoglobin A1c noted to be 9%.  He will be discharged on metformin twice daily for now to follow-up with his PCP in the near future.  He will also have nicotine patch for assistance with tobacco cessation.  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 Moderate PAD on ABI.  Discharge Instructions  Discharge Instructions    Diet - low sodium heart healthy   Complete by:  As directed    Increase activity slowly   Complete by:  As directed      Allergies as of 09/11/2018   No Known Allergies     Medication List    STOP taking these medications   naproxen sodium 220 MG tablet Commonly known  as:  ALEVE     TAKE these medications   amLODipine 5 MG tablet Commonly known as:  NORVASC Take 1 tablet (5 mg total) by mouth daily for 30 days. Start taking on:  September 12, 2018   gabapentin 100 MG capsule Commonly known as:  NEURONTIN Take 1 capsule (100 mg total) by mouth 3 (three) times daily for 30 days.   lisinopril 5 MG tablet Commonly known as:  ZESTRIL Take 1 tablet (5 mg total) by mouth daily for 30 days. Start  taking on:  September 12, 2018   metFORMIN 500 MG tablet Commonly known as:  Glucophage Take 1 tablet (500 mg total) by mouth 2 (two) times daily with a meal.   oxyCODONE-acetaminophen 5-325 MG tablet Commonly known as:  PERCOCET/ROXICET Take 1-2 tablets by mouth every 4 (four) hours as needed for moderate pain or severe pain.   sulfamethoxazole-trimethoprim 800-160 MG tablet Commonly known as:  BACTRIM DS Take 1 tablet by mouth 2 (two) times daily for 10 days.            Durable Medical Equipment  (From admission, onward)         Start     Ordered   09/11/18 1431  For home use only DME Walker rolling  [facility])  Once    Question:  Patient needs a walker to treat with the following condition  Answer:  Weakness   09/11/18 1432         Follow-up Information    Virl Cagey, MD On 09/16/2018.   Specialty:  General Surgery Why:  Wound check.  Contact information: [address] Linna Hoff [facility] [address] [phone]        Marty Heck, MD Follow up in 3 day(s).   Specialty:  Vascular Surgery Why:  Office will call with appt ASAP. Contact information: [place] [address] [phone]        Fayrene Helper, MD Follow up in 1 week(s).   Specialty:  Family Medicine Contact information: [address] [place] [address] [phone]          No Known Allergies  Consultations:  General surgery Dr. Constance Haw   Procedures/Studies: Dg Chest 2 View  Result Date: 09/08/2018 CLINICAL DATA:  Suspected sepsis. Pt has open wounds to 3rd toe of Lt foot with redness and swelling to entire Lt foot. Pt states wounds have been present x several weeks. Pt c/o chills intermittently. Hx smoker, diabetes Suspected Sepsis EXAM: CHEST - 2 VIEW COMPARISON:  None. FINDINGS: Normal mediastinum and cardiac silhouette. Normal pulmonary vasculature. No evidence of effusion, infiltrate, or pneumothorax. No acute bony abnormality. IMPRESSION:  No acute cardiopulmonary process. Electronically Signed   By: Suzy Bouchard M.D.   On: 09/08/2018 11:57   Mri Left Foot Without Contrast  Result Date: 09/08/2018 CLINICAL DATA:  Left foot wound with swelling for 6 months. Diabetes. Osteomyelitis suspected. EXAM: MRI OF THE LEFT FOOT WITHOUT CONTRAST TECHNIQUE: Multiplanar, multisequence MR imaging of the left forefoot was performed. No intravenous contrast was administered. COMPARISON:  Radiographs same date FINDINGS: Despite efforts by the technologist and patient, mild motion artifact is present on today's exam and could not be eliminated. This reduces exam sensitivity and specificity. Bones/Joint/Cartilage Marrow assessment is limited by motion and incomplete fat saturation on the T2 weighted images. Sagittal inversion recovery images were obtained. There is cortical destruction with increased T2 and decreased T1 marrow signal in the head of the 3rd proximal phalanx,  and probably the base of the 3rd middle phalanx, suspicious for osteomyelitis as correlated with the earlier radiographs. There is possible involvement of the 2nd metatarsal head as well, although this is less definitive. The 1st, 4th and 5th toes appear intact. There are mild degenerative changes at the 1st metatarsophalangeal joint. No significant joint effusions. The alignment is normal at the Lisfranc joint. Ligaments Intact Lisfranc ligament. The collateral ligaments of the metatarsophalangeal joints are intact. Muscles and Tendons Increased T2 signal throughout the forefoot musculature, most likely due to underlying diabetes. No focal fluid collection or tenosynovitis. Soft tissues There is soft tissue swelling in the 3rd toe. In correlation with the earlier radiographs, there is soft tissue emphysema as well. There is possible mild soft tissue swelling in the 2nd toe. No focal fluid collections are identified. IMPRESSION: 1. In correlation with the earlier radiographs, the findings are  highly suspicious for osteomyelitis involving the head of the 3rd proximal phalanx, and likely the 3rd middle phalanx. 2. Possible osteomyelitis of the 2nd proximal phalangeal head. 3. Soft tissue swelling in the 3rd and 2nd toes without focal fluid collection. Electronically Signed   By: Richardean Sale M.D.   On: 09/08/2018 17:15   US Arterial Abi (screening Lower Extremity)  Result Date: 09/08/2018 CLINICAL DATA:  Peripheral vascular disease. Nonhealing left foot ulcer. Chronic osteomyelitis. Smoking history. EXAM: NONINVASIVE PHYSIOLOGIC VASCULAR STUDY OF BILATERAL LOWER EXTREMITIES TECHNIQUE: Evaluation of both lower extremities were performed at rest, including calculation of ankle-brachial indices with single level Doppler, pressure and pulse volume recording. COMPARISON:  None. FINDINGS: Right ABI:  0.51 Left ABI:  0.49 Right Lower Extremity: Monophasic and a few biphasic waveforms in the right ankle. Left Lower Extremity:  Irregular arterial waveforms in left ankle. 0.5-0.79 Moderate PAD IMPRESSION: Right ABI is 0.51 and left ABI is 0.49. Findings are suggestive for moderate to severe peripheral arterial disease. Electronically Signed   By: Markus Daft M.D.   On: 09/08/2018 16:29   Dg Foot Complete Left  Result Date: 09/08/2018 CLINICAL DATA:  Third toe wound, initial encounter EXAM: LEFT FOOT - COMPLETE 3+ VIEW COMPARISON:  None. FINDINGS: Soft tissue gas is noted in the third toe near its base. Some bony resorption is noted in the distal aspect of the third proximal phalanx. IMPRESSION: Soft tissue infection involving the third toe with some resorption in the distal aspect of the third proximal phalanx consistent with osteomyelitis. Electronically Signed   By: Inez Catalina M.D.   On: 09/08/2018 11:49     Discharge Exam: Vitals:   09/11/18 0540 09/11/18 1415  BP: 138/81 (!) 122/58  Pulse: 81 76  Resp: 16 18  Temp: 97.7 F (36.5 C) 98.4 F (36.9 C)  SpO2: 98% 97%   Vitals:   09/10/18  2015 09/10/18 2024 09/11/18 0540 09/11/18 1415  BP: (!) 156/91  138/81 (!) 122/58  Pulse: 78  81 76  Resp: 18  16 18   Temp: 98.5 F (36.9 C)  97.7 F (36.5 C) 98.4 F (36.9 C)  TempSrc: Oral  Oral Oral  SpO2: 97% 97% 98% 97%  Weight:      Height:        General: Pt is alert, awake, not in acute distress Cardiovascular: RRR, S1/S2 +, no rubs, no gallops Respiratory: CTA bilaterally, no wheezing, no rhonchi Abdominal: Soft, NT, ND, bowel sounds + Extremities: no edema, no cyanosis    The results of significant diagnostics from this hospitalization (including imaging, microbiology, ancillary and laboratory) are  listed below for reference.     Microbiology: Recent Results (from the past 240 hour(s))  Wound or Superficial Culture     Status: None   Collection Time: 09/08/18 11:13 AM  Result Value Ref Range Status   Specimen Description   Final    FOOT TOE Performed at [facility], [address] [place], [place] [address]    Special Requests   Final    NONE Performed at [facility], [address] [place], [place] [address]    Gram Stain   Final    MODERATE WBC PRESENT,BOTH PMN AND MONONUCLEAR ABUNDANT GRAM NEGATIVE RODS ABUNDANT GRAM POSITIVE COCCI    Culture   Final    ABUNDANT MORGANELLA MORGANII NO GROUP A STREP (S.PYOGENES) ISOLATED NO STAPHYLOCOCCUS AUREUS ISOLATED WITHIN MIXED ORGANISMS Performed at [facility], [address], [place], [place] [address]    Report Status 09/11/2018 FINAL  Final   Organism ID, Bacteria MORGANELLA MORGANII  Final      Susceptibility   Morganella morganii - MIC*    AMPICILLIN >=32 RESISTANT Resistant     CEFAZOLIN >=64 RESISTANT Resistant     CEFEPIME <=1 SENSITIVE Sensitive     CEFTAZIDIME <=1 SENSITIVE Sensitive     CEFTRIAXONE <=1 SENSITIVE Sensitive     CIPROFLOXACIN <=0.25 SENSITIVE Sensitive     GENTAMICIN <=1 SENSITIVE Sensitive     IMIPENEM 1 SENSITIVE Sensitive     TRIMETH/SULFA <=20 SENSITIVE Sensitive      AMPICILLIN/SULBACTAM 16 INTERMEDIATE Intermediate     PIP/TAZO <=4 SENSITIVE Sensitive     * ABUNDANT MORGANELLA MORGANII  Culture, blood (Routine x 2)     Status: None (Preliminary result)   Collection Time: 09/08/18 11:25 AM  Result Value Ref Range Status   Specimen Description RIGHT ANTECUBITAL  Final   Special Requests   Final    BOTTLES DRAWN AEROBIC AND ANAEROBIC Blood Culture adequate volume   Culture   Final    NO GROWTH 3 DAYS Performed at [facility], [address] [place], [place] [address]    Report Status PENDING  Incomplete  Culture, blood (Routine x 2)     Status: None (Preliminary result)   Collection Time: 09/08/18 11:25 AM  Result Value Ref Range Status   Specimen Description LEFT ANTECUBITAL  Final   Special Requests   Final    BOTTLES DRAWN AEROBIC AND ANAEROBIC Blood Culture adequate volume   Culture   Final    NO GROWTH 3 DAYS Performed at [facility], [address] [place], [place] [address]    Report Status PENDING  Incomplete  SARS Coronavirus 2 (CEPHEID - Performed in [place] hospital lab), Hosp Order     Status: None   Collection Time: 09/08/18 11:48 AM  Result Value Ref Range Status   SARS Coronavirus 2 NEGATIVE NEGATIVE Final    Comment: (NOTE) If result is NEGATIVE SARS-CoV-2 target nucleic acids are NOT DETECTED. The SARS-CoV-2 RNA is generally detectable in upper and lower  respiratory specimens during the acute phase of infection. The lowest  concentration of SARS-CoV-2 viral copies this assay can detect is 250  copies / mL. A negative result does not preclude SARS-CoV-2 infection  and should not be used as the sole basis for treatment or other  patient management decisions.  A negative result may occur with  improper specimen collection / handling, submission of specimen other  than nasopharyngeal swab, presence of viral mutation(s) within the  areas targeted by this  assay, and inadequate number of viral copies  (<250 copies /  mL). A negative result must be combined with clinical  observations, patient history, and epidemiological information. If result is POSITIVE SARS-CoV-2 target nucleic acids are DETECTED. The SARS-CoV-2 RNA is generally detectable in upper and lower  respiratory specimens dur ing the acute phase of infection.  Positive  results are indicative of active infection with SARS-CoV-2.  Clinical  correlation with patient history and other diagnostic information is  necessary to determine patient infection status.  Positive results do  not rule out bacterial infection or co-infection with other viruses. If result is PRESUMPTIVE POSTIVE SARS-CoV-2 nucleic acids MAY BE PRESENT.   A presumptive positive result was obtained on the submitted specimen  and confirmed on repeat testing.  While 2019 novel coronavirus  (SARS-CoV-2) nucleic acids may be present in the submitted sample  additional confirmatory testing may be necessary for epidemiological  and / or clinical management purposes  to differentiate between  SARS-CoV-2 and other Sarbecovirus currently known to infect humans.  If clinically indicated additional testing with an alternate test  methodology [phone]) is advised. The SARS-CoV-2 RNA is generally  detectable in upper and lower respiratory sp ecimens during the acute  phase of infection. The expected result is Negative. Fact Sheet for Patients:  StrictlyIdeas.no Fact Sheet for Healthcare Providers: BankingDealers.co.za This test is not yet approved or cleared by the [place] FDA and has been authorized for detection and/or diagnosis of SARS-CoV-2 by FDA under an Emergency Use Authorization (EUA).  This EUA will remain in effect (meaning this test can be used) for the duration of the COVID-19 declaration under Section 564(b)(1) of the Act, 21 U.S.C. section 360bbb-3(b)(1), unless the authorization is terminated or revoked  sooner. Performed at [facility], [address] [place],  [address]   Surgical PCR screen     Status: None   Collection Time: 09/09/18  4:10 PM  Result Value Ref Range Status   MRSA, PCR NEGATIVE NEGATIVE Final   Staphylococcus aureus NEGATIVE NEGATIVE Final    Comment: (NOTE) The Xpert SA Assay (FDA approved for NASAL specimens in patients 80 years of age and older), is one component of a comprehensive surveillance program. It is not intended to diagnose infection nor to guide or monitor treatment. Performed at [facility], [address], [place],  [address]      Labs: BNP (last 3 results) No results for input(s): BNP in the last 8760 hours. Basic Metabolic Panel: Recent Labs  Lab 09/08/18 1124 09/09/18 0606 09/10/18 0348 09/11/18 0535  NA 136 136 138 137  K 4.4 3.9 4.1 4.3  CL 98 101 99 100  CO2 25 23 26 27   GLUCOSE 204* 153* 112* 169*  BUN 14 17 29* 29*  CREATININE 0.71 0.74 1.03 0.93  CALCIUM 8.9 8.3* 8.9 8.7*  MG  --   --  2.2  --    Liver Function Tests: Recent Labs  Lab 09/08/18 1124  AST 11*  ALT 12  ALKPHOS 95  BILITOT 0.7  PROT 7.6  ALBUMIN 3.3*   No results for input(s): LIPASE, AMYLASE in the last 168 hours. No results for input(s): AMMONIA in the last 168 hours. CBC: Recent Labs  Lab 09/08/18 1124 09/09/18 0606 09/10/18 0348 09/11/18 0535  WBC 20.0* 17.2* 22.1* 23.8*  NEUTROABS 17.0* 14.0* 17.9* 20.6*  HGB 13.6 12.3* 12.8* 12.4*  HCT 42.1 37.5* 39.8 38.2*  MCV 91.5 90.1 91.1 91.6  PLT 392 415* 442*  470*   Cardiac Enzymes: No results for input(s): CKTOTAL, CKMB, CKMBINDEX, TROPONINI in the last 168 hours. BNP: Invalid input(s): POCBNP CBG: Recent Labs  Lab 09/10/18 1113 09/10/18 1621 09/10/18 2018 09/11/18 0735 09/11/18 1150  GLUCAP 112* 167* 229* 155* 119*   D-Dimer No results for input(s): DDIMER in the last 72 hours. Hgb A1c No results for input(s): HGBA1C in the last 72 hours. Lipid Profile No results  for input(s): CHOL, HDL, LDLCALC, TRIG, CHOLHDL, LDLDIRECT in the last 72 hours. Thyroid function studies No results for input(s): TSH, T4TOTAL, T3FREE, THYROIDAB in the last 72 hours.  Invalid input(s): FREET3 Anemia work up No results for input(s): VITAMINB12, FOLATE, FERRITIN, TIBC, IRON, RETICCTPCT in the last 72 hours. Urinalysis    Component Value Date/Time   COLORURINE YELLOW 09/08/2018 1037   APPEARANCEUR CLEAR 09/08/2018 1037   LABSPEC 1.014 09/08/2018 1037   PHURINE 6.0 09/08/2018 1037   GLUCOSEU >=500 (A) 09/08/2018 1037   HGBUR SMALL (A) 09/08/2018 1037   HGBUR trace-intact 10/26/2008 0000   BILIRUBINUR NEGATIVE 09/08/2018 1037   KETONESUR 5 (A) 09/08/2018 1037   PROTEINUR 30 (A) 09/08/2018 1037   UROBILINOGEN 0.2 04/27/2012 1142   NITRITE NEGATIVE 09/08/2018 1037   LEUKOCYTESUR NEGATIVE 09/08/2018 1037   Sepsis Labs Invalid input(s): PROCALCITONIN,  WBC,  LACTICIDVEN Microbiology Recent Results (from the past 240 hour(s))  Wound or Superficial Culture     Status: None   Collection Time: 09/08/18 11:13 AM  Result Value Ref Range Status   Specimen Description   Final    FOOT TOE Performed at [facility], [address], [place], [place] [address]    Special Requests   Final    NONE Performed at [facility], [address] [place], [place] [address]    Gram Stain   Final    MODERATE WBC PRESENT,BOTH PMN AND MONONUCLEAR ABUNDANT GRAM NEGATIVE RODS ABUNDANT GRAM POSITIVE COCCI    Culture   Final    ABUNDANT MORGANELLA MORGANII NO GROUP A STREP (S.PYOGENES) ISOLATED NO STAPHYLOCOCCUS AUREUS ISOLATED WITHIN MIXED ORGANISMS Performed at [facility], [place] [address] [place], [place] [address]    Report Status 09/11/2018 FINAL  Final   Organism ID, Bacteria MORGANELLA MORGANII  Final      Susceptibility   Morganella morganii - MIC*    AMPICILLIN >=32 RESISTANT Resistant     CEFAZOLIN >=64 RESISTANT Resistant     CEFEPIME <=1 SENSITIVE Sensitive      CEFTAZIDIME <=1 SENSITIVE Sensitive     CEFTRIAXONE <=1 SENSITIVE Sensitive     CIPROFLOXACIN <=0.25 SENSITIVE Sensitive     GENTAMICIN <=1 SENSITIVE Sensitive     IMIPENEM 1 SENSITIVE Sensitive     TRIMETH/SULFA <=20 SENSITIVE Sensitive     AMPICILLIN/SULBACTAM 16 INTERMEDIATE Intermediate     PIP/TAZO <=4 SENSITIVE Sensitive     * ABUNDANT MORGANELLA MORGANII  Culture, blood (Routine x 2)     Status: None (Preliminary result)   Collection Time: 09/08/18 11:25 AM  Result Value Ref Range Status   Specimen Description RIGHT ANTECUBITAL  Final   Special Requests   Final    BOTTLES DRAWN AEROBIC AND ANAEROBIC Blood Culture adequate volume   Culture   Final    NO GROWTH 3 DAYS Performed at [facility], [address] [place], [place] [address]    Report Status PENDING  Incomplete  Culture, blood (Routine x 2)     Status: None (Preliminary result)   Collection Time: 09/08/18 11:25 AM  Result Value Ref Range Status   Specimen Description LEFT ANTECUBITAL  Final   Special Requests   Final    BOTTLES DRAWN AEROBIC AND ANAEROBIC Blood Culture adequate volume   Culture   Final    NO GROWTH 3 DAYS Performed at [facility], [address], [place], [place] [address]    Report Status PENDING  Incomplete  SARS Coronavirus 2 (CEPHEID - Performed in [place] hospital lab), Hosp Order     Status: None   Collection Time: 09/08/18 11:48 AM  Result Value Ref Range Status   SARS Coronavirus 2 NEGATIVE NEGATIVE Final    Comment: (NOTE) If result is NEGATIVE SARS-CoV-2 target nucleic acids are NOT DETECTED. The SARS-CoV-2 RNA is generally detectable in upper and lower  respiratory specimens during the acute phase of infection. The lowest  concentration of SARS-CoV-2 viral copies this assay can detect is 250  copies / mL. A negative result does not preclude SARS-CoV-2 infection  and should not be used as the sole basis for treatment or other  patient management decisions.  A negative result  may occur with  improper specimen collection / handling, submission of specimen other  than nasopharyngeal swab, presence of viral mutation(s) within the  areas targeted by this assay, and inadequate number of viral copies  (<250 copies / mL). A negative result must be combined with clinical  observations, patient history, and epidemiological information. If result is POSITIVE SARS-CoV-2 target nucleic acids are DETECTED. The SARS-CoV-2 RNA is generally detectable in upper and lower  respiratory specimens dur ing the acute phase of infection.  Positive  results are indicative of active infection with SARS-CoV-2.  Clinical  correlation with patient history and other diagnostic information is  necessary to determine patient infection status.  Positive results do  not rule out bacterial infection or co-infection with other viruses. If result is PRESUMPTIVE POSTIVE SARS-CoV-2 nucleic acids MAY BE PRESENT.   A presumptive positive result was obtained on the submitted specimen  and confirmed on repeat testing.  While 2019 novel coronavirus  (SARS-CoV-2) nucleic acids may be present in the submitted sample  additional confirmatory testing may be necessary for epidemiological  and / or clinical management purposes  to differentiate between  SARS-CoV-2 and other Sarbecovirus currently known to infect humans.  If clinically indicated additional testing with an alternate test  methodology [phone]) is advised. The SARS-CoV-2 RNA is generally  detectable in upper and lower respiratory sp ecimens during the acute  phase of infection. The expected result is Negative. Fact Sheet for Patients:  StrictlyIdeas.no Fact Sheet for Healthcare Providers: BankingDealers.co.za This test is not yet approved or cleared by the [place] FDA and has been authorized for detection and/or diagnosis of SARS-CoV-2 by FDA under an Emergency Use Authorization (EUA).   This EUA will remain in effect (meaning this test can be used) for the duration of the COVID-19 declaration under Section 564(b)(1) of the Act, 21 U.S.C. section 360bbb-3(b)(1), unless the authorization is terminated or revoked sooner. Performed at [facility], [address] [place], [place] [address]   Surgical PCR screen     Status: None   Collection Time: 09/09/18  4:10 PM  Result Value Ref Range Status   MRSA, PCR NEGATIVE NEGATIVE Final   Staphylococcus aureus NEGATIVE NEGATIVE Final    Comment: (NOTE) The Xpert SA Assay (FDA approved for NASAL specimens in patients 75 years of age and older), is one component of a comprehensive surveillance program. It is not  intended to diagnose infection nor to guide or monitor treatment. Performed at [facility], [address], [place], [place] [address]      Time coordinating discharge: 35 minutes  SIGNED:   Rodena Goldmann, DO [facility] 09/11/2018, 2:35 PM  If 7PM-7AM, please contact night-coverage [email or web address] Password [email or web address]

## 2018-09-11 NOTE — Evaluation (Signed)
Physical Therapy Evaluation Patient Details Name: Thomas Garcia MRN: [id number] DOB: 1940/05/19 Today's Date: 09/11/2018   History of Present Illness  ARCH METHOT is a 78 y.o. 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  Is been dealing with an infection in his left foot for the past 6 months.  He has not has sought medical treatment for this.  He reports that the injury initially started with first toe and then he developed excoriations around that toe that developed into an infection that spread to the other toes.  He says that eventually most of the left foot became involved.  Is been using topical treatments and reports that he is developed an ulcer recently between the third and fourth toes.  He reports that his symptoms continued to worsen and he developed chills and is unaware of fever.  His main complaint is the pain that he is been feeling in the left foot.  He has pain mostly in the toes of the left foot and the lower leg.  He is having drainage and a foul smell from the third toe of the left foot.  He continues to smoke cigarettes daily.  He reports that his blood sugars have been mostly less than 200.  The patient denies chest pain and shortness of breath.    Clinical Impression  Patient demonstrates fair/good return for keeping body weight off left forefoot while wearing post op shoe, has to lean on nearby objects for support and required use of SPC for safety to increase stability, no loss of balance using SPC without having to lean on side rails.  Patient requested RW for longer distances when he goes shopping with his brother and will benefit also from use of SPC in household.  Patient tolerated sitting up at bedside after therapy.  Patient will benefit from continued physical therapy in hospital to  increase strength, balance, endurance for safe ADLs and gait.    Follow Up Recommendations Supervision - Intermittent    Equipment Recommendations  Rolling walker with 5" wheels;Cane    Recommendations for Other Services       Precautions / Restrictions Precautions Precautions: None Required Braces or Orthoses: Other Brace Other Brace: post op shoe left foot Restrictions Weight Bearing Restrictions: Yes LLE Weight Bearing: Partial weight bearing LLE Partial Weight Bearing Percentage or Pounds: weightbearing on left heel only      Mobility  Bed Mobility Overal bed mobility: Modified Independent             General bed mobility comments: head of bed raised  Transfers Overall transfer level: Modified independent Equipment used: None;Straight cane             General transfer comment: increased time  Ambulation/Gait Ambulation/Gait assistance: Supervision Gait Distance (Feet): 65 Feet Assistive device: Straight cane Gait Pattern/deviations: Decreased step length - left;Decreased stance time - left;Decreased stride length;Step-through pattern Gait velocity: decreased   General Gait Details: slightly labored cadence with fair/good return for keeping body weight off left forefoot, tends to lean on nearby objects for support when not using an AD, safer using SPC with mostly 2 point gait pattern  Stairs            Wheelchair Mobility    Modified Rankin (Stroke Patients Only)       Balance  Overall balance assessment: Needs assistance Sitting-balance support: Feet supported;No upper extremity supported Sitting balance-Leahy Scale: Good     Standing balance support: During functional activity;No upper extremity supported Standing balance-Leahy Scale: Fair Standing balance comment: fair/good using SPC                             Pertinent Vitals/Pain Pain Assessment: 0-10 Pain Score: 7  Pain Location: 6-7/10 left foot when dangling Pain  Descriptors / Indicators: Sore;Discomfort Pain Intervention(s): Limited activity within patient's tolerance;Monitored during session    Home Living Family/patient expects to be discharged to:: Private residence Living Arrangements: Alone Available Help at Discharge: Family;Available PRN/intermittently Type of Home: Mobile home Home Access: Stairs to enter Entrance Stairs-Rails: Right;Left Entrance Stairs-Number of Steps: 2 Home Layout: One level Home Equipment: Other (comment) Additional Comments: has walking stick    Prior Function Level of Independence: Independent;Needs assistance   Gait / Transfers Assistance Needed: household ambulator  ADL's / Homemaking Assistance Needed: assisted by his brother for community ADLs        Hand Dominance        Extremity/Trunk Assessment   Upper Extremity Assessment Upper Extremity Assessment: Overall WFL for tasks assessed    Lower Extremity Assessment Lower Extremity Assessment: Generalized weakness;RLE deficits/detail;LLE deficits/detail RLE Deficits / Details: grossly 5/5 LLE Deficits / Details: grossly 4/5 except ankle/foot not tested LLE: Unable to fully assess due to pain    Cervical / Trunk Assessment Cervical / Trunk Assessment: Normal  Communication   Communication: No difficulties  Cognition Arousal/Alertness: Awake/alert Behavior During Therapy: WFL for tasks assessed/performed Overall Cognitive Status: Within Functional Limits for tasks assessed                                        General Comments      Exercises     Assessment/Plan    PT Assessment Patient needs continued PT services  PT Problem List Decreased strength;Decreased activity tolerance;Decreased balance;Decreased mobility       PT Treatment Interventions Therapeutic exercise;Gait training;Stair training;Functional mobility training;Therapeutic activities;Patient/family education    PT Goals (Current goals can be found  in the Care Plan section)  Acute Rehab PT Goals Patient Stated Goal: return home with family to assist PT Goal Formulation: With patient Time For Goal Achievement: 09/18/18 Potential to Achieve Goals: Good    Frequency Min 3X/week   Barriers to discharge        Co-evaluation               AM-PAC PT "6 Clicks" Mobility  Outcome Measure Help needed turning from your back to your side while in a flat bed without using bedrails?: None Help needed moving from lying on your back to sitting on the side of a flat bed without using bedrails?: None Help needed moving to and from a bed to a chair (including a wheelchair)?: None Help needed standing up from a chair using your arms (e.g., wheelchair or bedside chair)?: None Help needed to walk in hospital room?: A Little Help needed climbing 3-5 steps with a railing? : A Lot 6 Click Score: 21    End of Session   Activity Tolerance: Patient tolerated treatment well;Patient limited by fatigue;Patient limited by pain Patient left: in bed;with call bell/phone within reach(seated at bedside) Nurse Communication: Mobility status PT Visit Diagnosis: Unsteadiness on feet (R26.81);Other  abnormalities of gait and mobility (R26.89);Muscle weakness (generalized) (M62.81)    Time: 5188-4166 PT Time Calculation (min) (ACUTE ONLY): 25 min   Charges:   PT Evaluation $PT Eval Moderate Complexity: 1 Mod PT Treatments $Therapeutic Activity: 23-37 mins        10:11 AM, 09/11/18 Lonell Grandchild, MPT Physical Therapist with [facility] 336 [phone] office [phone] mobile phone

## 2018-09-11 NOTE — Progress Notes (Signed)
Discharge instructions given to patient. Demonstrated to patient dressing change to left toe area. Patient verbalized and demonstrated understanding of instructions. Patient discharged home with son in stable condition.

## 2018-09-11 NOTE — Progress Notes (Signed)
[facility]  Doing fair after his amputation. Worked with PT this AM. Rolling walker provided. He says he is feeling well otherwise. Ate lunch.    BP 138/81 (BP Location: Left Arm)   Pulse 81   Temp 97.7 F (36.5 C) (Oral)   Resp 16   Ht 5\' 9"  (1.753 m)   Wt 86.9 kg   SpO2 98%   BMI 28.29 kg/m   Bandage removed. Minimal drainage. 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  Doing fair.   -Twice daily dressing changes. 2nd digit incision needs xeroform gauze over the incision. 3rd digit wound needs saline dampened gauze packed into the cavity. Dry gauze over the area. Dry gauze between remaining toes. Wrap with Kerlix and ACE wrap.  -Will likely need Home Health -Would complete a course of antibiotics for 14 days total given his continued cellulitis  -Needs Vascular Referral ASAP for possible revascularization to aid with healing  -Plan to see the patient in the clinic in person for wound check on 09/16/2018 @ 11:30AM.   Curlene Labrum, MD [facility] [address] [place],  [address] [phone] (office)

## 2018-09-11 NOTE — Plan of Care (Signed)
  Problem: Acute Rehab PT Goals(only PT should resolve) Goal: Pt Will Go Supine/Side To Sit Outcome: Progressing Flowsheets (Taken 09/11/2018 1012) Pt will go Supine/Side to Sit: Independently Goal: Patient Will Transfer Sit To/From Stand Outcome: Progressing Flowsheets (Taken 09/11/2018 1012) Patient will transfer sit to/from stand: with modified independence Goal: Pt Will Transfer Bed To Chair/Chair To Bed Outcome: Progressing Flowsheets (Taken 09/11/2018 1012) Pt will Transfer Bed to Chair/Chair to Bed: with modified independence Goal: Pt Will Ambulate Outcome: Progressing Flowsheets (Taken 09/11/2018 1012) Pt will Ambulate: 100 feet; with modified independence; with rolling walker; with cane   10:13 AM, 09/11/18 Lonell Grandchild, MPT Physical Therapist with [facility] [phone] office [phone] mobile phone

## 2018-09-13 LAB — CULTURE, BLOOD (ROUTINE X 2)
Culture: NO GROWTH
Culture: NO GROWTH
Special Requests: ADEQUATE
Special Requests: ADEQUATE

## 2018-09-15 ENCOUNTER — Telehealth (HOSPITAL_COMMUNITY): Payer: Self-pay | Admitting: Rehabilitation

## 2018-09-15 NOTE — Telephone Encounter (Signed)

## 2018-09-16 ENCOUNTER — Telehealth: Payer: Self-pay

## 2018-09-16 ENCOUNTER — Encounter: Payer: Self-pay | Admitting: Vascular Surgery

## 2018-09-16 ENCOUNTER — Encounter: Payer: Medicare Other | Admitting: Vascular Surgery

## 2018-09-16 ENCOUNTER — Encounter: Payer: Self-pay | Admitting: *Deleted

## 2018-09-16 ENCOUNTER — Encounter (HOSPITAL_COMMUNITY): Payer: Medicare Other

## 2018-09-16 ENCOUNTER — Other Ambulatory Visit: Payer: Self-pay | Admitting: *Deleted

## 2018-09-16 ENCOUNTER — Other Ambulatory Visit: Payer: Self-pay

## 2018-09-16 ENCOUNTER — Telehealth: Payer: Self-pay | Admitting: General Surgery

## 2018-09-16 ENCOUNTER — Ambulatory Visit (INDEPENDENT_AMBULATORY_CARE_PROVIDER_SITE_OTHER): Payer: Medicare Other | Admitting: Vascular Surgery

## 2018-09-16 ENCOUNTER — Other Ambulatory Visit (HOSPITAL_COMMUNITY)
Admission: RE | Admit: 2018-09-16 | Discharge: 2018-09-16 | Disposition: A | Discharge status: Home / Self Care | Payer: Medicare Other | Source: Ambulatory Visit | Attending: Vascular Surgery | Admitting: Vascular Surgery

## 2018-09-16 VITALS — BP 125/65 | HR 90 | Temp 101.2°F | Ht 67.0 in | Wt 191.0 lb

## 2018-09-16 DIAGNOSIS — I739 Peripheral vascular disease, unspecified: Secondary | ICD-10-CM

## 2018-09-16 DIAGNOSIS — Z01812 Encounter for preprocedural laboratory examination: Secondary | ICD-10-CM | POA: Insufficient documentation

## 2018-09-16 DIAGNOSIS — Z1159 Encounter for screening for other viral diseases: Secondary | ICD-10-CM | POA: Insufficient documentation

## 2018-09-16 LAB — SARS CORONAVIRUS 2 BY RT PCR (HOSPITAL ORDER, PERFORMED IN ~~LOC~~ HOSPITAL LAB): SARS Coronavirus 2: NEGATIVE

## 2018-09-16 MED ORDER — OXYCODONE-ACETAMINOPHEN 5-325 MG PO TABS: 1.0000 | ORAL_TABLET | ORAL | 0 refills | Status: DC | PRN

## 2018-09-16 NOTE — Telephone Encounter (Signed)
[facility]  Patient called and out of pain medication. Only given 10 percocet by hospitalist.  Seeing Vascular today and following up with me for a wound check on Thursday.  Reiterated need for Vascular appointment today. Acute post op pain.   Refilled Percocet 5-325mg  take 1-2 tablet q 4 PRN (20#)   Curlene Labrum, MD [facility] [address] [place], [place] [address] [phone] (office)

## 2018-09-16 NOTE — Progress Notes (Signed)
Patient scheduled to go to [facility] from the office today for nasal swab for Covid screening. Instructions given.

## 2018-09-16 NOTE — Progress Notes (Signed)
Patient name: Thomas Garcia MRN: [id number] DOB: 07-27-1940 Sex: male  REASON FOR CONSULT: Nonhealing left second and third toe amps, concern for underlying PAD  HPI: Thomas CRESCENZO is a 78 y.o. 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  He reports one year of severe pain in the left foot - mainly the great toe.  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  These have been nonhealing.  On evaluation today he reports he still having severe pain in his left foot.  He did have ABIs that showed an ABI of 0.51 on the right 0.49 on the left with monophasic runoff.  He has a history of heavy tobacco abuse and says smoked his entire life.  He still smoking 10 cigarettes a day.  States he does not want to go through a bunch of operations and is willing to lose his leg if it would expedite alleviating his discomfort.  States he is able to walk when foot not hurting.  Past Medical History:  Diagnosis Date  . Diabetes mellitus without complication ([place])   . Kidney stones     Past Surgical History:  Procedure Laterality Date  . AMPUTATION TOE Left 09/10/2018   Procedure: AMPUTATION OF THIRD TOE LEFT FOOT, DEBRIDEMENT OF ULCERS ON SECOND TOE AND PARTIAL AMPUTATION SECOND TOE ON LEFT FOOT;  Surgeon: Virl Cagey, MD;  Location: [facility];  Service: General;  Laterality: Left;  . CYSTOSCOPY  05/08/2012   Procedure: CYSTOSCOPY;  Surgeon: Marissa Nestle, MD;  Location: [facility];  Service: Urology;  Laterality: N/A;  Evacuation of clots  . KIDNEY SURGERY     sutures   . SPLENECTOMY    . TRANSURETHRAL RESECTION OF PROSTATE  05/14/2012   Procedure: TRANSURETHRAL RESECTION OF THE PROSTATE (TURP);  Surgeon: Marissa Nestle, MD;  Location: [facility];  Service: Urology;  Laterality: N/A;    No family  history on file.  SOCIAL HISTORY: Social History   Socioeconomic History  . Marital status: Divorced    Spouse name: Not on file  . Number of children: Not on file  . Years of education: Not on file  . Highest education level: Not on file  Occupational History  . Not on file  Social Needs  . Financial resource strain: Not on file  . Food insecurity:    Worry: Not on file    Inability: Not on file  . Transportation needs:    Medical: Not on file    Non-medical: Not on file  Tobacco Use  . Smoking status: Current Every Day Smoker    Packs/day: 0.25    Years: 45.00    Pack years: 11.25    Types: Cigarettes  . Smokeless tobacco: Never Used  Substance and Sexual Activity  . Alcohol use: No  . Drug use: No  . Sexual activity: Not on file  Lifestyle  . Physical activity:    Days per week: Not on file    Minutes per session: Not on file  . Stress: Not on file  Relationships  . Social connections:    Talks on phone: Not on file    Gets together: Not on file    Attends religious service: Not on file    Active member of club or organization: Not  on file    Attends meetings of clubs or organizations: Not on file    Relationship status: Not on file  . Intimate partner violence:    Fear of current or ex partner: Not on file    Emotionally abused: Not on file    Physically abused: Not on file    Forced sexual activity: Not on file  Other Topics Concern  . Not on file  Social History Narrative  . Not on file    No Known Allergies  Current Outpatient Medications  Medication Sig Dispense Refill  . amLODipine (NORVASC) 5 MG tablet Take 1 tablet (5 mg total) by mouth daily for 30 days. 30 tablet 0  . gabapentin (NEURONTIN) 100 MG capsule Take 1 capsule (100 mg total) by mouth 3 (three) times daily for 30 days. 90 capsule 0  . lisinopril (ZESTRIL) 5 MG tablet Take 1 tablet (5 mg total) by mouth daily for 30 days. 30 tablet 0  . metFORMIN (GLUCOPHAGE) 500 MG tablet Take 1  tablet (500 mg total) by mouth 2 (two) times daily with a meal. 60 tablet 11  . oxyCODONE-acetaminophen (PERCOCET/ROXICET) 5-325 MG tablet Take 1-2 tablets by mouth every 4 (four) hours as needed for severe pain. 20 tablet 0  . sulfamethoxazole-trimethoprim (BACTRIM DS) 800-160 MG tablet Take 1 tablet by mouth 2 (two) times daily for 10 days. 20 tablet 0   No current facility-administered medications for this visit.     REVIEW OF SYSTEMS:  [X]  denotes positive finding, [ ]  denotes negative finding Cardiac  Comments:  Chest pain or chest pressure:    Shortness of breath upon exertion:    Short of breath when lying flat:    Irregular heart rhythm:        Vascular    Pain in calf, thigh, or hip brought on by ambulation:    Pain in feet at night that wakes you up from your sleep:  x left  Blood clot in your veins:    Leg swelling:         Pulmonary    Oxygen at home:    Productive cough:     Wheezing:         Neurologic    Sudden weakness in arms or legs:     Sudden numbness in arms or legs:     Sudden onset of difficulty speaking or slurred speech:    Temporary loss of vision in one eye:     Problems with dizziness:         Gastrointestinal    Blood in stool:     Vomited blood:         Genitourinary    Burning when urinating:     Blood in urine:        Psychiatric    Major depression:         Hematologic    Bleeding problems:    Problems with blood clotting too easily:        Skin    Rashes or ulcers:        Constitutional    Fever or chills:      PHYSICAL EXAM: Vitals:   09/16/18 1106 09/16/18 1107  BP:  125/65  Pulse:  90  Temp:  (!) 101.2 F (38.4 C)  TempSrc:  Temporal  SpO2:  94%  Weight: 191 lb (86.6 kg) 191 lb (86.6 kg)  Height: 5\' 9"  (1.753 m) 5\' 7"  (1.702 m)    GENERAL: The patient  is a well-nourished male, in no acute distress. The vital signs are documented above. CARDIAC: There is a regular rate and rhythm.  VASCULAR:  Palpable bilateral  femoral pulses Left DP/PT monophasic signals Left 2nd and third toe amputation sites as pictured below Dependent rubor in left foot with some underlying cellulitis PULMONARY: There is good air exchange bilaterally without wheezing or rales. ABDOMEN: Soft and non-tender with normal pitched bowel sounds.  MUSCULOSKELETAL: There are no major deformities or cyanosis. NEUROLOGIC: No focal weakness or paresthesias are detected. SKIN: There are no ulcers or rashes noted. PSYCHIATRIC: The patient has a normal affect.      DATA:   ABI 0.51 on the right 0.4 on the left monophasic waveforms at the ankle.  Assessment/Plan:  Had a long discussion with Mr. Beswick regarding his left foot.  He has critical limb ischemia with tissue loss and nonhealing left second and third toe amps.  This is certainly a limb threatening situation and I discussed without intervention he is at high risk for limb loss.  We will schedule him for arteriogram with possible intervention tomorrow and I will plan to admit him afterwards because he is going to need some further amputation whether that be a TMA versus BKA pending the results of revascularization.  He states he does not want to go through multiple surgeries that are at high risk for failure and is at peace losing his leg if it means less pain and less hospital stays.  Discussed with son via conference call.   Marty Heck, MD [facility] Office: [phone] Pager: [phone]

## 2018-09-17 ENCOUNTER — Encounter (HOSPITAL_COMMUNITY)
Admission: RE | Disposition: A | Discharge status: Rehabilitation Facility | Payer: Self-pay | Source: Home / Self Care | Attending: Vascular Surgery

## 2018-09-17 ENCOUNTER — Inpatient Hospital Stay (HOSPITAL_COMMUNITY)
Admission: RE | Admit: 2018-09-17 | Discharge: 2018-09-24 | DRG: 617 | Disposition: A | Discharge status: Rehabilitation Facility | Payer: Medicare Other | Attending: Vascular Surgery | Admitting: Vascular Surgery

## 2018-09-17 ENCOUNTER — Other Ambulatory Visit: Payer: Self-pay

## 2018-09-17 DIAGNOSIS — E1169 Type 2 diabetes mellitus with other specified complication: Principal | ICD-10-CM | POA: Diagnosis present

## 2018-09-17 DIAGNOSIS — S88112S Complete traumatic amputation at level between knee and ankle, left lower leg, sequela: Secondary | ICD-10-CM | POA: Diagnosis not present

## 2018-09-17 DIAGNOSIS — G8918 Other acute postprocedural pain: Secondary | ICD-10-CM

## 2018-09-17 DIAGNOSIS — Z7984 Long term (current) use of oral hypoglycemic drugs: Secondary | ICD-10-CM | POA: Diagnosis not present

## 2018-09-17 DIAGNOSIS — S88112A Complete traumatic amputation at level between knee and ankle, left lower leg, initial encounter: Secondary | ICD-10-CM

## 2018-09-17 DIAGNOSIS — I10 Essential (primary) hypertension: Secondary | ICD-10-CM | POA: Diagnosis not present

## 2018-09-17 DIAGNOSIS — G546 Phantom limb syndrome with pain: Secondary | ICD-10-CM | POA: Diagnosis not present

## 2018-09-17 DIAGNOSIS — E1152 Type 2 diabetes mellitus with diabetic peripheral angiopathy with gangrene: Secondary | ICD-10-CM | POA: Diagnosis present

## 2018-09-17 DIAGNOSIS — D62 Acute posthemorrhagic anemia: Secondary | ICD-10-CM | POA: Diagnosis not present

## 2018-09-17 DIAGNOSIS — Z7902 Long term (current) use of antithrombotics/antiplatelets: Secondary | ICD-10-CM | POA: Diagnosis not present

## 2018-09-17 DIAGNOSIS — E1142 Type 2 diabetes mellitus with diabetic polyneuropathy: Secondary | ICD-10-CM

## 2018-09-17 DIAGNOSIS — F17210 Nicotine dependence, cigarettes, uncomplicated: Secondary | ICD-10-CM | POA: Diagnosis present

## 2018-09-17 DIAGNOSIS — M792 Neuralgia and neuritis, unspecified: Secondary | ICD-10-CM | POA: Diagnosis not present

## 2018-09-17 DIAGNOSIS — E119 Type 2 diabetes mellitus without complications: Secondary | ICD-10-CM | POA: Diagnosis not present

## 2018-09-17 DIAGNOSIS — M869 Osteomyelitis, unspecified: Secondary | ICD-10-CM | POA: Diagnosis present

## 2018-09-17 DIAGNOSIS — I739 Peripheral vascular disease, unspecified: Secondary | ICD-10-CM | POA: Diagnosis present

## 2018-09-17 DIAGNOSIS — T8781 Dehiscence of amputation stump: Secondary | ICD-10-CM | POA: Diagnosis not present

## 2018-09-17 DIAGNOSIS — Z720 Tobacco use: Secondary | ICD-10-CM | POA: Diagnosis not present

## 2018-09-17 HISTORY — DX: Other specified postprocedural states: Z98.890

## 2018-09-17 HISTORY — PX: ABDOMINAL AORTOGRAM W/LOWER EXTREMITY: CATH118223

## 2018-09-17 HISTORY — PX: PERIPHERAL VASCULAR BALLOON ANGIOPLASTY: CATH118281

## 2018-09-17 HISTORY — DX: Other specified postprocedural states: R11.2

## 2018-09-17 LAB — POCT I-STAT, CHEM 8
BUN: 17 mg/dL (ref 8–23)
Calcium, Ion: 1.02 mmol/L — ABNORMAL LOW (ref 1.15–1.40)
Chloride: 102 mmol/L (ref 98–111)
Creatinine, Ser: 0.8 mg/dL (ref 0.61–1.24)
Glucose, Bld: 148 mg/dL — ABNORMAL HIGH (ref 70–99)
HCT: 42 % (ref 39.0–52.0)
Hemoglobin: 14.3 g/dL (ref 13.0–17.0)
Potassium: 5.4 mmol/L — ABNORMAL HIGH (ref 3.5–5.1)
Sodium: 132 mmol/L — ABNORMAL LOW (ref 135–145)
TCO2: 26 mmol/L (ref 22–32)

## 2018-09-17 LAB — GLUCOSE, CAPILLARY
Glucose-Capillary: 150 mg/dL — ABNORMAL HIGH (ref 70–99)
Glucose-Capillary: 127 mg/dL — ABNORMAL HIGH (ref 70–99)
Glucose-Capillary: 97 mg/dL (ref 70–99)

## 2018-09-17 LAB — CBC
HCT: 36.3 % — ABNORMAL LOW (ref 39.0–52.0)
Hemoglobin: 12 g/dL — ABNORMAL LOW (ref 13.0–17.0)
MCH: 29.2 pg (ref 26.0–34.0)
MCHC: 33.1 g/dL (ref 30.0–36.0)
MCV: 88.3 fL (ref 80.0–100.0)
Platelets: 474 10*3/uL — ABNORMAL HIGH (ref 150–400)
RBC: 4.11 MIL/uL — ABNORMAL LOW (ref 4.22–5.81)
RDW: 13.7 % (ref 11.5–15.5)
WBC: 14.5 10*3/uL — ABNORMAL HIGH (ref 4.0–10.5)
nRBC: 0 % (ref 0.0–0.2)

## 2018-09-17 LAB — POCT ACTIVATED CLOTTING TIME
Activated Clotting Time: 213 seconds
Activated Clotting Time: 180 seconds

## 2018-09-17 LAB — CREATININE, SERUM
Creatinine, Ser: 0.81 mg/dL (ref 0.61–1.24)
GFR calc Af Amer: >60 mL/min (ref >60)
GFR calc non Af Amer: >60 mL/min (ref >60)

## 2018-09-17 SURGERY — ABDOMINAL AORTOGRAM W/LOWER EXTREMITY
Anesthesia: LOCAL

## 2018-09-17 MED ORDER — LIDOCAINE HCL (PF) 1 % IJ SOLN
INTRAMUSCULAR | Status: DC | PRN
  Administered 2018-09-17: 15 mL

## 2018-09-17 MED ORDER — VANCOMYCIN HCL IN DEXTROSE 1-5 GM/200ML-% IV SOLN: 1000.0000 mg | Freq: Once | INTRAVENOUS | Status: DC

## 2018-09-17 MED ORDER — CLOPIDOGREL BISULFATE 75 MG PO TABS: 75.0000 mg | ORAL_TABLET | Freq: Every day | ORAL | Status: DC

## 2018-09-17 MED ORDER — SODIUM CHLORIDE 0.9 % IV SOLN: 250.0000 mL | INTRAVENOUS | Status: DC | PRN

## 2018-09-17 MED ORDER — SODIUM CHLORIDE 0.9% FLUSH: 3.0000 mL | INTRAVENOUS | Status: DC | PRN

## 2018-09-17 MED ORDER — NITROGLYCERIN 1 MG/10 ML FOR IR/CATH LAB
INTRA_ARTERIAL | Status: AC
  Filled 2018-09-17: qty 10

## 2018-09-17 MED ORDER — CLOPIDOGREL BISULFATE 300 MG PO TABS
ORAL_TABLET | ORAL | Status: DC | PRN
  Administered 2018-09-17: 300 mg via ORAL

## 2018-09-17 MED ORDER — HEPARIN SODIUM (PORCINE) 5000 UNIT/ML IJ SOLN
5000.0000 [IU] | Freq: Three times a day (TID) | INTRAMUSCULAR | Status: DC
  Administered 2018-09-17 – 2018-09-22 (×10): 5000 [IU] via SUBCUTANEOUS
  Filled 2018-09-17 (×11): qty 1

## 2018-09-17 MED ORDER — HEPARIN (PORCINE) IN NACL 1000-0.9 UT/500ML-% IV SOLN
INTRAVENOUS | Status: DC | PRN
  Administered 2018-09-17: 500 mL

## 2018-09-17 MED ORDER — FENTANYL CITRATE (PF) 100 MCG/2ML IJ SOLN
INTRAMUSCULAR | Status: AC
  Filled 2018-09-17: qty 2

## 2018-09-17 MED ORDER — OXYCODONE-ACETAMINOPHEN 5-325 MG PO TABS
1.0000 | ORAL_TABLET | ORAL | Status: DC | PRN
  Administered 2018-09-17 – 2018-09-19 (×6): 2 via ORAL
  Administered 2018-09-19: 1 via ORAL
  Administered 2018-09-19 – 2018-09-24 (×23): 2 via ORAL
  Filled 2018-09-17 (×30): qty 2

## 2018-09-17 MED ORDER — HEPARIN SODIUM (PORCINE) 1000 UNIT/ML IJ SOLN
INTRAMUSCULAR | Status: AC
  Filled 2018-09-17: qty 1

## 2018-09-17 MED ORDER — CLOPIDOGREL BISULFATE 75 MG PO TABS
75.0000 mg | ORAL_TABLET | Freq: Every day | ORAL | Status: DC
  Administered 2018-09-19 – 2018-09-24 (×6): 75 mg via ORAL
  Filled 2018-09-17 (×6): qty 1

## 2018-09-17 MED ORDER — HEPARIN SODIUM (PORCINE) 1000 UNIT/ML IJ SOLN
INTRAMUSCULAR | Status: DC | PRN
  Administered 2018-09-17: 2000 [IU] via INTRAVENOUS
  Administered 2018-09-17: 8000 [IU] via INTRAVENOUS

## 2018-09-17 MED ORDER — AMLODIPINE BESYLATE 5 MG PO TABS: 5.0000 mg | ORAL_TABLET | Freq: Every day | ORAL | Status: DC

## 2018-09-17 MED ORDER — MIDAZOLAM HCL 2 MG/2ML IJ SOLN
INTRAMUSCULAR | Status: DC | PRN
  Administered 2018-09-17 (×4): 1 mg via INTRAVENOUS

## 2018-09-17 MED ORDER — GABAPENTIN 100 MG PO CAPS
100.0000 mg | ORAL_CAPSULE | Freq: Three times a day (TID) | ORAL | Status: DC
  Administered 2018-09-17 – 2018-09-24 (×19): 100 mg via ORAL
  Filled 2018-09-17 (×19): qty 1

## 2018-09-17 MED ORDER — HYDRALAZINE HCL 20 MG/ML IJ SOLN: 5.0000 mg | INTRAMUSCULAR | Status: DC | PRN

## 2018-09-17 MED ORDER — ACETAMINOPHEN 325 MG PO TABS: 650.0000 mg | ORAL_TABLET | ORAL | Status: DC | PRN

## 2018-09-17 MED ORDER — SODIUM CHLORIDE 0.9 % IV SOLN
INTRAVENOUS | Status: DC
  Administered 2018-09-17: 11:00:00 via INTRAVENOUS

## 2018-09-17 MED ORDER — PIPERACILLIN-TAZOBACTAM 3.375 G IVPB
3.3750 g | Freq: Three times a day (TID) | INTRAVENOUS | Status: DC
  Administered 2018-09-17 – 2018-09-24 (×21): 3.375 g via INTRAVENOUS
  Filled 2018-09-17 (×17): qty 50

## 2018-09-17 MED ORDER — ATORVASTATIN CALCIUM 10 MG PO TABS
10.0000 mg | ORAL_TABLET | Freq: Every day | ORAL | Status: DC
  Administered 2018-09-17 – 2018-09-23 (×7): 10 mg via ORAL
  Filled 2018-09-17 (×7): qty 1

## 2018-09-17 MED ORDER — CLOPIDOGREL BISULFATE 300 MG PO TABS
ORAL_TABLET | ORAL | Status: AC
  Filled 2018-09-17: qty 1

## 2018-09-17 MED ORDER — VANCOMYCIN HCL 10 G IV SOLR
1250.0000 mg | Freq: Two times a day (BID) | INTRAVENOUS | Status: DC
  Administered 2018-09-17 – 2018-09-20 (×7): 1250 mg via INTRAVENOUS
  Filled 2018-09-17 (×8): qty 1250

## 2018-09-17 MED ORDER — MIDAZOLAM HCL 2 MG/2ML IJ SOLN
INTRAMUSCULAR | Status: AC
  Filled 2018-09-17: qty 2

## 2018-09-17 MED ORDER — LABETALOL HCL 5 MG/ML IV SOLN: 10.0000 mg | INTRAVENOUS | Status: DC | PRN

## 2018-09-17 MED ORDER — HEPARIN (PORCINE) IN NACL 1000-0.9 UT/500ML-% IV SOLN
INTRAVENOUS | Status: AC
  Filled 2018-09-17: qty 1000

## 2018-09-17 MED ORDER — SODIUM CHLORIDE 0.9% FLUSH: 10.0000 mL | INTRAVENOUS | Status: DC | PRN

## 2018-09-17 MED ORDER — ASPIRIN EC 81 MG PO TBEC
81.0000 mg | DELAYED_RELEASE_TABLET | Freq: Every day | ORAL | Status: DC
  Administered 2018-09-17 – 2018-09-24 (×7): 81 mg via ORAL
  Filled 2018-09-17 (×7): qty 1

## 2018-09-17 MED ORDER — SODIUM CHLORIDE 0.9% FLUSH: 3.0000 mL | Freq: Two times a day (BID) | INTRAVENOUS | Status: DC

## 2018-09-17 MED ORDER — PIPERACILLIN-TAZOBACTAM 3.375 G IVPB 30 MIN: 3.3750 g | Freq: Once | INTRAVENOUS | Status: DC

## 2018-09-17 MED ORDER — NITROGLYCERIN 1 MG/10 ML FOR IR/CATH LAB
INTRA_ARTERIAL | Status: DC | PRN
  Administered 2018-09-17: 400 ug via INTRA_ARTERIAL

## 2018-09-17 MED ORDER — SODIUM CHLORIDE 0.9 % IV SOLN: INTRAVENOUS | Status: AC

## 2018-09-17 MED ORDER — CLOPIDOGREL BISULFATE 75 MG PO TABS: 300.0000 mg | ORAL_TABLET | Freq: Once | ORAL | Status: DC

## 2018-09-17 MED ORDER — FENTANYL CITRATE (PF) 100 MCG/2ML IJ SOLN
INTRAMUSCULAR | Status: DC | PRN
  Administered 2018-09-17 (×4): 25 ug via INTRAVENOUS

## 2018-09-17 MED ORDER — ONDANSETRON HCL 4 MG/2ML IJ SOLN: 4.0000 mg | Freq: Four times a day (QID) | INTRAMUSCULAR | Status: DC | PRN

## 2018-09-17 MED ORDER — LIDOCAINE HCL (PF) 1 % IJ SOLN
INTRAMUSCULAR | Status: AC
  Filled 2018-09-17: qty 30

## 2018-09-17 SURGICAL SUPPLY — 26 items
BALL STERLING OTW 2.5X150X150 (BALLOONS) ×1
BALLN ADMIRAL INPACT 5X200 (BALLOONS) ×3
BALLN MUSTANG 4X150X135 (BALLOONS) ×3
BALLN MUSTANG 5X150X135 (BALLOONS) ×3
BALLN STERLING OTW 2.5X150X150 (BALLOONS) ×2
BALLOON ADMIRAL INPACT 5X200 (BALLOONS) ×2 IMPLANT
BALLOON MUSTANG 4X150X135 (BALLOONS) ×2 IMPLANT
BALLOON MUSTANG 5X150X135 (BALLOONS) ×2 IMPLANT
BALLOON STRLNG OTW 2.5X150X150 (BALLOONS) ×2 IMPLANT
CATH CXI SUPP 2.6F 150 ANG (CATHETERS) ×3 IMPLANT
CATH OMNI FLUSH 5F 65CM (CATHETERS) ×3 IMPLANT
CATH QUICKCROSS SUPP .035X90CM (MICROCATHETER) ×3 IMPLANT
DEVICE TORQUE .025-.038 (MISCELLANEOUS) ×3 IMPLANT
GLIDEWIRE ADV .035X260CM (WIRE) ×3 IMPLANT
KIT ENCORE 26 ADVANTAGE (KITS) ×3 IMPLANT
KIT MICROPUNCTURE NIT STIFF (SHEATH) ×3 IMPLANT
KIT PV (KITS) ×3 IMPLANT
SHEATH FLEX ANSEL ANG 6F 45CM (SHEATH) ×3 IMPLANT
SHEATH PINNACLE 5F 10CM (SHEATH) ×3 IMPLANT
SHEATH PROBE COVER 6X72 (BAG) ×3 IMPLANT
SYR MEDRAD MARK V 150ML (SYRINGE) ×3 IMPLANT
TRANSDUCER W/STOPCOCK (MISCELLANEOUS) ×3 IMPLANT
TRAY PV CATH (CUSTOM PROCEDURE TRAY) ×3 IMPLANT
WIRE BENTSON .035X145CM (WIRE) ×3 IMPLANT
WIRE G V18X300CM (WIRE) ×3 IMPLANT
WIRE TORQFLEX AUST .018X40CM (WIRE) ×6 IMPLANT

## 2018-09-17 NOTE — H&P (Signed)
History and Physical Interval Note:  09/17/2018 10:47 AM  Thomas Garcia  has presented today for surgery, with the diagnosis of pad  non healing wound lt foot.  The various methods of treatment have been discussed with the patient and family. After consideration of risks, benefits and other options for treatment, the patient has consented to  Procedure(s): ABDOMINAL AORTOGRAM W/LOWER EXTREMITY (N/A) as a surgical intervention.  The patient's history has been reviewed, patient examined, no change in status, stable for surgery.  I have reviewed the patient's chart and labs.  Questions were answered to the patient's satisfaction.    Aortogram, BLE arteriogram.  CLI with non-healing toe amps left foot.  Thomas Garcia  Patient name: Thomas Garcia         MRN: [id number]        DOB: 21-Mar-1941          Sex: male  REASON FOR CONSULT: Nonhealing left second and third toe amps, concern for underlying PAD  HPI: Thomas Garcia is a 78 y.o. 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  He reports one year of severe pain in the left foot - mainly the great toe.  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  These have been nonhealing.  On evaluation today he reports he still having severe pain in his left foot.  He did have ABIs that showed an ABI of 0.51 on the right 0.49 on the left with monophasic runoff.  He has a history of heavy tobacco abuse and says smoked his entire life.  He still smoking 10 cigarettes a day.  States he does not want to go through a bunch of operations and is willing to lose his leg if it would expedite alleviating his discomfort.  States he is able to walk when foot not hurting.      Past Medical History:  Diagnosis Date  . Diabetes mellitus without complication ([place])    . Kidney stones          Past Surgical History:  Procedure Laterality Date  . AMPUTATION TOE Left 09/10/2018   Procedure: AMPUTATION OF THIRD TOE LEFT FOOT, DEBRIDEMENT OF ULCERS ON SECOND TOE AND PARTIAL AMPUTATION SECOND TOE ON LEFT FOOT;  Surgeon: Virl Cagey, MD;  Location: [facility];  Service: General;  Laterality: Left;  . CYSTOSCOPY  05/08/2012   Procedure: CYSTOSCOPY;  Surgeon: Marissa Nestle, MD;  Location: [facility];  Service: Urology;  Laterality: N/A;  Evacuation of clots  . KIDNEY SURGERY     sutures   . SPLENECTOMY    . TRANSURETHRAL RESECTION OF PROSTATE  05/14/2012   Procedure: TRANSURETHRAL RESECTION OF THE PROSTATE (TURP);  Surgeon: Marissa Nestle, MD;  Location: [facility];  Service: Urology;  Laterality: N/A;    No family history on file.  SOCIAL HISTORY: Social History        Socioeconomic History  . Marital status: Divorced    Spouse name: Not on file  . Number of children: Not on file  . Years of education: Not on file  . Highest education level: Not on file  Occupational History  . Not on file  Social Needs  . Financial resource strain: Not on file  . Food insecurity:    Worry: Not on  file    Inability: Not on file  . Transportation needs:    Medical: Not on file    Non-medical: Not on file  Tobacco Use  . Smoking status: Current Every Day Smoker    Packs/day: 0.25    Years: 45.00    Pack years: 11.25    Types: Cigarettes  . Smokeless tobacco: Never Used  Substance and Sexual Activity  . Alcohol use: No  . Drug use: No  . Sexual activity: Not on file  Lifestyle  . Physical activity:    Days per week: Not on file    Minutes per session: Not on file  . Stress: Not on file  Relationships  . Social connections:    Talks on phone: Not on file    Gets together: Not on file    Attends religious service: Not on file    Active member of club or organization: Not on file    Attends meetings of  clubs or organizations: Not on file    Relationship status: Not on file  . Intimate partner violence:    Fear of current or ex partner: Not on file    Emotionally abused: Not on file    Physically abused: Not on file    Forced sexual activity: Not on file  Other Topics Concern  . Not on file  Social History Narrative  . Not on file    No Known Allergies        Current Outpatient Medications  Medication Sig Dispense Refill  . amLODipine (NORVASC) 5 MG tablet Take 1 tablet (5 mg total) by mouth daily for 30 days. 30 tablet 0  . gabapentin (NEURONTIN) 100 MG capsule Take 1 capsule (100 mg total) by mouth 3 (three) times daily for 30 days. 90 capsule 0  . lisinopril (ZESTRIL) 5 MG tablet Take 1 tablet (5 mg total) by mouth daily for 30 days. 30 tablet 0  . metFORMIN (GLUCOPHAGE) 500 MG tablet Take 1 tablet (500 mg total) by mouth 2 (two) times daily with a meal. 60 tablet 11  . oxyCODONE-acetaminophen (PERCOCET/ROXICET) 5-325 MG tablet Take 1-2 tablets by mouth every 4 (four) hours as needed for severe pain. 20 tablet 0  . sulfamethoxazole-trimethoprim (BACTRIM DS) 800-160 MG tablet Take 1 tablet by mouth 2 (two) times daily for 10 days. 20 tablet 0   No current facility-administered medications for this visit.     REVIEW OF SYSTEMS:  [X]  denotes positive finding, [ ]  denotes negative finding Cardiac  Comments:  Chest pain or chest pressure:    Shortness of breath upon exertion:    Short of breath when lying flat:    Irregular heart rhythm:        Vascular    Pain in calf, thigh, or hip brought on by ambulation:    Pain in feet at night that wakes you up from your sleep:  x left  Blood clot in your veins:    Leg swelling:         Pulmonary    Oxygen at home:    Productive cough:     Wheezing:         Neurologic    Sudden weakness in arms or legs:     Sudden numbness in arms or legs:     Sudden onset of difficulty  speaking or slurred speech:    Temporary loss of vision in one eye:     Problems with dizziness:  Gastrointestinal    Blood in stool:     Vomited blood:         Genitourinary    Burning when urinating:     Blood in urine:        Psychiatric    Major depression:         Hematologic    Bleeding problems:    Problems with blood clotting too easily:        Skin    Rashes or ulcers:        Constitutional    Fever or chills:      PHYSICAL EXAM:     Vitals:   09/16/18 1106 09/16/18 1107  BP:  125/65  Pulse:  90  Temp:  (!) 101.2 F (38.4 C)  TempSrc:  Temporal  SpO2:  94%  Weight: 191 lb (86.6 kg) 191 lb (86.6 kg)  Height: 5\' 9"  (1.753 m) 5\' 7"  (1.702 m)    GENERAL: The patient is a well-nourished male, in no acute distress. The vital signs are documented above. CARDIAC: There is a regular rate and rhythm.  VASCULAR:  Palpable bilateral femoral pulses Left DP/PT monophasic signals Left 2nd and third toe amputation sites as pictured below Dependent rubor in left foot with some underlying cellulitis PULMONARY: There is good air exchange bilaterally without wheezing or rales. ABDOMEN: Soft and non-tender with normal pitched bowel sounds.  MUSCULOSKELETAL: There are no major deformities or cyanosis. NEUROLOGIC: No focal weakness or paresthesias are detected. SKIN: There are no ulcers or rashes noted. PSYCHIATRIC: The patient has a normal affect.      DATA:   ABI 0.51 on the right 0.4 on the left monophasic waveforms at the ankle.  Assessment/Plan:  Had a long discussion with Mr. Holley regarding his left foot.  He has critical limb ischemia with tissue loss and nonhealing left second and third toe amps.  This is certainly a limb threatening situation and I discussed without intervention he is at high risk for limb loss.  We will schedule him for arteriogram with possible intervention  tomorrow and I will plan to admit him afterwards because he is going to need some further amputation whether that be a TMA versus BKA pending the results of revascularization.  He states he does not want to go through multiple surgeries that are at high risk for failure and is at peace losing his leg if it means less pain and less hospital stays.  Discussed with son via conference call.   Thomas Heck, MD [facility] Office: [phone] Pager: [phone]

## 2018-09-17 NOTE — Progress Notes (Signed)
Pharmacy Antibiotic Note  Thomas Garcia is a 78 y.o. male admitted on 09/17/2018 with cellulitis/foot abscess.  Pharmacy has been consulted for vancomycin and zosyn dosing.  Plan: 1. Vancomycin 1250 mg q 12 hrs.  Est AUC 506 2. Zosyn 3.375g IV q 8 hrs, extended interval infusion. 3. Monitor cultures, renal function and clinical course. 4. Vancomycin level as needed.  Height: 5\' 9"  (175.3 cm) Weight: 185 lb (83.9 kg) IBW/kg (Calculated) : 70.7  Temp (24hrs), Avg:98.3 F (36.8 C), Min:98 F (36.7 C), Max:98.6 F (37 C)  Recent Labs  Lab 09/11/18 0535 09/17/18 1048  WBC 23.8*  --   CREATININE 0.93 0.80    Estimated Creatinine Clearance: 78.6 mL/min (by C-G formula based on SCr of 0.8 mg/dL).    No Known Allergies  Antimicrobials this admission:  Vanc 6/10 >  Zosyn 6/10 >   Dose adjustments this admission:    Microbiology results:  6/9 COVID neg  Thank you for allowing pharmacy to be a part of this patient's care. Marguerite Olea, [facility] Clinical Pharmacist Phone [phone]  09/17/2018 5:02 PM

## 2018-09-17 NOTE — Progress Notes (Signed)
Pt received from cath lab. VSS. L ft with dopplerable DP pulse. CHG complete. Pt oriented to room and unit. Will continue to monitor.  Clyde Canterbury, RN

## 2018-09-17 NOTE — Op Note (Signed)
Patient name: Thomas Garcia MRN: [id number] DOB: 1940-07-23 Sex: male  09/17/2018 Pre-operative Diagnosis: Critical limb ischemia of the left lower extremity with tissue loss Post-operative diagnosis:  Same Surgeon:  Marty Heck, MD Procedure Performed: 1.  Ultrasound-guided access of the right common femoral artery 2.  Aortogram with bilateral lower extremity arteriogram 3.  Left SFA angioplasty (4 mm x 150 mm Mustang and 5 mm x 200 mm drug-coated Admiral Impact) 4.  Left anterior tibial artery angioplasty (2.5 mm x 150 mm Sterling) 5.  109 minutes of monitored moderate conscious sedation time  Indications: Patient is a 78 year old male who presented to clinic yesterday with evidence of osteomyelitis on MRI and requiring toe amputations and subsequently underwent a partial left second toe amputation and left third toe amputation that are nonhealing.  His noninvasive imaging suggested multilevel vascular disease and he presents today for lower extremity arteriogram and intervention after risks benefits are discussed.  I discussed high risk for limb loss.  Findings:  Aortogram showed patent single renal arteries bilaterally.  His aortoiliac segment was very calcified and small but there was no evidence of flow-limiting stenosis. On the right which is the nonaffected extremity he did have a large posterior plaque in the distal external iliac common femoral artery.  The right profunda was widely patent but the SFA was diffusely diseased with a short focal chronic total occlusion and 50 to 70% stenosis throughout its course.  Above and below-knee popliteal artery were patent.  I could only identify a single tibial runoff via the anterior tibial but it was difficult to evaluate this in the mid to distal calf and into the foot given patient movement. On the left which was the side of interest he had a patent common femoral and a very large profunda.  His SFA was very small and diffusely  diseased and he had several segments of chronic total occlusions with reconstitution at [place]'s canal.  The above and below-knee popliteal artery was widely patent.  He had a very robust anterior tibial artery in the proximal to mid calf as the only patent tibial.  Unfortunately in the distal calf it appeared the anterior tibial occluded and then through collaterals filled the peroneal which had a collateral to fill the posterior tibial retrograde.  No posterior tibial or peroneal stump was identified proximally and the TP trunk was occluded as well.  Ultimately the SFA was recanalized with balloon angioplasty and then treated with a drug-coated balloon.  I went down the anterior tibial and then treated this with a balloon into the top of the ankle.  Patient had very brisk dorsalis pedis and posterior tibial signals at completion of the case.   Procedure:  The patient was identified in the holding area and taken to room 8.  The patient was then placed supine on the table and prepped and draped in the usual sterile fashion.  A time out was called.  Ultrasound was used to evaluate the right common femoral artery.  It was patent .  A digital ultrasound image was acquired.  A micropuncture needle was used to access the right common femoral artery under ultrasound guidance.  There was a large posterior plaque here and had a lot of trouble getting a wire to advance.  After several sticks with a micro access needle I finally was successful getting my wire to track into the external iliac.  I then placed a micro-sheath and through this placed a Bentson wire into the  infrarenal aorta exchanged for a short 5 French sheath in the right groin.  An Omni Flush catheter was then advanced and aortogram was obtained with pertinent findings noted above.  We then pulled the catheter down to the aortic bifurcation and bilateral lower extremity runoff was obtained.  We had difficulty evaluating the tibias on the left due to patient  movement.  Ultimately used an Omni Flush to select the contralateral common iliac and also got a Bentson wire down into the distal left external iliac and passed the Omni Flush catheter.  We then got dedicated tibial shots on the left including lateral foot shot.  Best I could tell he had severe SFA disease with a patent popliteal and then only had single-vessel runoff via an anterior tibial that occluded in the distal calf and had collaterals that retrograde filled the peroneal and posterior tibial at the ankle. He has been adamant that he does not want a lot of surgery.  Given that I am not sure his foot is salvageable at this point, I elected to go ahead and perform endovascular approach for re-vascularization.  I then used a Glidewire advantage through the Omni Flush catheter and exchanged for a large 6 [place] Ansell sheath in the right groin was placed over the aortic bifurcation.  The patient was given 100 units heparin/kg.  I then used a Glidewire advantage selected the SFA and using a quick cross catheter crossed the multilevel chronic total occlusions in the SFA into the popliteal artery which we confirmed we were back in the true lumen with a brief hand-injection.  Over this wire I then initially dilated everything with a 4 mm x 150 mm Mustang to nominal pressure for 2 minutes.  Another hand-injection through the sheath showed no flow-limiting dissection but the artery still appeared fairly small in caliber.  I went on down and turned my attention to the tibials planing to come back and use a drug-coated balloon in the SFA.  I exchanged for a V 18 wire that went down the anterior tibial and then tracked over this with a CXI catheter.  I could get down the anterior tibial to just above the ankle but then it appeared the artery occluded and there was a collateral with reconstitution of the dorsalis pedis in the foot.  Ultimately I selected a 2.5 mm x 150 mm Sterling and angioplastied the distal anterior  tibial to try and give him the best chance of inline flow.  In further evaluating these images there was no stump to stick antegrade to get down the posterior tibial or peroneal.  Patient was given additional nitroglycerin.  I then did some additional hand injections that confirmed the anterior tibial remained patent with filling of the peroneal and posterior tibial retrograde off collaterals. I went back up to the SFA and this was treated with a long 5 mm drug-coated balloon to nominal pressure for 2 minutes.  One final hand-injection through the sheath showed inline flow down the SFA popliteal and filling of the anterior tibial.  At that point time wires and catheters were removed.  I left his long 6 French sheath in the right groin but pulled it back off the aortic bifurcation.  Sheath will be removed in PACU.  I did not mynx this due to large posterior plaque.  Plan: Concerned that he may have abscess in the plantar arch of his left foot given the amount of pain he is having and based on his exam.  He  has a fair amount of foot cellulitis at this time.  I will admit him for IV antibiotics.  I plan to take him to the OR tomorrow for I&D of his foot with TMA versus BKA.  He has been having severe pain and states very clearly he does not want a bunch of return trips to the OR and is willing to have an amputation tomorrow if the tissue in the foot does not look salvageable.       Marty Heck, MD [facility] [place] Office: [phone] Pager: [place]

## 2018-09-17 NOTE — Progress Notes (Signed)
Cath lab called and informed of lab results  (Potassium level)

## 2018-09-17 NOTE — Progress Notes (Signed)
    6 Fr. Long sheath was pulled manually from the R F/A, and pressure was held for 20 min.. Sterile gauze was applied at the site. R groin area is soft and non tender. R and L DP were obtained via doppler before and after rhe sheath pull.    Bed rest started at 1520 X 4 hr. Instructions were given to tre patient about his limitations during that time.   HR 81 NSR  BP 152/99  sPO2 98 % on R/A

## 2018-09-18 ENCOUNTER — Inpatient Hospital Stay (HOSPITAL_COMMUNITY): Payer: Medicare Other | Admitting: Certified Registered"

## 2018-09-18 ENCOUNTER — Encounter (HOSPITAL_COMMUNITY)
Admission: RE | Disposition: A | Discharge status: Rehabilitation Facility | Payer: Self-pay | Source: Home / Self Care | Attending: Vascular Surgery

## 2018-09-18 ENCOUNTER — Ambulatory Visit: Payer: Medicare Other | Admitting: General Surgery

## 2018-09-18 ENCOUNTER — Encounter (HOSPITAL_COMMUNITY): Payer: Self-pay | Admitting: Vascular Surgery

## 2018-09-18 DIAGNOSIS — I739 Peripheral vascular disease, unspecified: Secondary | ICD-10-CM

## 2018-09-18 HISTORY — PX: APPLICATION OF WOUND VAC: SHX5189

## 2018-09-18 HISTORY — PX: TRANSMETATARSAL AMPUTATION: SHX6197

## 2018-09-18 LAB — BASIC METABOLIC PANEL
Anion gap: 10 (ref 5–15)
BUN: 8 mg/dL (ref 8–23)
CO2: 24 mmol/L (ref 22–32)
Calcium: 8.2 mg/dL — ABNORMAL LOW (ref 8.9–10.3)
Chloride: 100 mmol/L (ref 98–111)
Creatinine, Ser: 0.85 mg/dL (ref 0.61–1.24)
GFR calc Af Amer: >60 mL/min (ref >60)
GFR calc non Af Amer: >60 mL/min (ref >60)
Glucose, Bld: 119 mg/dL — ABNORMAL HIGH (ref 70–99)
Potassium: 4.2 mmol/L (ref 3.5–5.1)
Sodium: 134 mmol/L — ABNORMAL LOW (ref 135–145)

## 2018-09-18 LAB — CBC
HCT: 34.1 % — ABNORMAL LOW (ref 39.0–52.0)
Hemoglobin: 11.4 g/dL — ABNORMAL LOW (ref 13.0–17.0)
MCH: 29.6 pg (ref 26.0–34.0)
MCHC: 33.4 g/dL (ref 30.0–36.0)
MCV: 88.6 fL (ref 80.0–100.0)
Platelets: 473 10*3/uL — ABNORMAL HIGH (ref 150–400)
RBC: 3.85 MIL/uL — ABNORMAL LOW (ref 4.22–5.81)
RDW: 13.8 % (ref 11.5–15.5)
WBC: 11.5 10*3/uL — ABNORMAL HIGH (ref 4.0–10.5)
nRBC: 0 % (ref 0.0–0.2)

## 2018-09-18 LAB — GLUCOSE, CAPILLARY
Glucose-Capillary: 111 mg/dL — ABNORMAL HIGH (ref 70–99)
Glucose-Capillary: 113 mg/dL — ABNORMAL HIGH (ref 70–99)
Glucose-Capillary: 109 mg/dL — ABNORMAL HIGH (ref 70–99)

## 2018-09-18 SURGERY — AMPUTATION, FOOT, TRANSMETATARSAL
Anesthesia: Choice | Site: Foot | Laterality: Left

## 2018-09-18 MED ORDER — ONDANSETRON HCL 4 MG/2ML IJ SOLN
INTRAMUSCULAR | Status: AC
  Filled 2018-09-18: qty 2

## 2018-09-18 MED ORDER — LACTATED RINGERS IV SOLN
INTRAVENOUS | Status: DC
  Administered 2018-09-18 (×2): via INTRAVENOUS

## 2018-09-18 MED ORDER — PHENYLEPHRINE 40 MCG/ML (10ML) SYRINGE FOR IV PUSH (FOR BLOOD PRESSURE SUPPORT)
PREFILLED_SYRINGE | INTRAVENOUS | Status: AC
  Filled 2018-09-18: qty 10

## 2018-09-18 MED ORDER — ONDANSETRON HCL 4 MG/2ML IJ SOLN: 4.0000 mg | Freq: Once | INTRAMUSCULAR | Status: DC | PRN

## 2018-09-18 MED ORDER — LIDOCAINE 2% (20 MG/ML) 5 ML SYRINGE
INTRAMUSCULAR | Status: DC | PRN
  Administered 2018-09-18: 100 mg via INTRAVENOUS

## 2018-09-18 MED ORDER — FENTANYL CITRATE (PF) 100 MCG/2ML IJ SOLN
INTRAMUSCULAR | Status: DC | PRN
  Administered 2018-09-18: 50 ug via INTRAVENOUS

## 2018-09-18 MED ORDER — EPHEDRINE SULFATE-NACL 50-0.9 MG/10ML-% IV SOSY
PREFILLED_SYRINGE | INTRAVENOUS | Status: DC | PRN
  Administered 2018-09-18: 15 mg via INTRAVENOUS

## 2018-09-18 MED ORDER — DEXAMETHASONE SODIUM PHOSPHATE 10 MG/ML IJ SOLN
INTRAMUSCULAR | Status: DC | PRN
  Administered 2018-09-18: 5 mg via INTRAVENOUS

## 2018-09-18 MED ORDER — HYDROMORPHONE HCL 1 MG/ML IJ SOLN: 0.2500 mg | INTRAMUSCULAR | Status: DC | PRN

## 2018-09-18 MED ORDER — MORPHINE SULFATE (PF) 2 MG/ML IV SOLN
2.0000 mg | INTRAVENOUS | Status: DC | PRN
  Administered 2018-09-19 – 2018-09-20 (×3): 2 mg via INTRAVENOUS
  Filled 2018-09-18 (×3): qty 1

## 2018-09-18 MED ORDER — MEPERIDINE HCL 25 MG/ML IJ SOLN: 6.2500 mg | INTRAMUSCULAR | Status: DC | PRN

## 2018-09-18 MED ORDER — ONDANSETRON HCL 4 MG/2ML IJ SOLN
INTRAMUSCULAR | Status: DC | PRN
  Administered 2018-09-18: 4 mg via INTRAVENOUS

## 2018-09-18 MED ORDER — FENTANYL CITRATE (PF) 250 MCG/5ML IJ SOLN
INTRAMUSCULAR | Status: AC
  Filled 2018-09-18: qty 5

## 2018-09-18 MED ORDER — 0.9 % SODIUM CHLORIDE (POUR BTL) OPTIME
TOPICAL | Status: DC | PRN
  Administered 2018-09-18: 11:00:00 1000 mL

## 2018-09-18 MED ORDER — LIDOCAINE 2% (20 MG/ML) 5 ML SYRINGE
INTRAMUSCULAR | Status: AC
  Filled 2018-09-18: qty 10

## 2018-09-18 MED ORDER — PROPOFOL 10 MG/ML IV BOLUS
INTRAVENOUS | Status: AC
  Filled 2018-09-18: qty 20

## 2018-09-18 MED ORDER — DEXAMETHASONE SODIUM PHOSPHATE 10 MG/ML IJ SOLN
INTRAMUSCULAR | Status: AC
  Filled 2018-09-18: qty 1

## 2018-09-18 MED ORDER — PHENYLEPHRINE 40 MCG/ML (10ML) SYRINGE FOR IV PUSH (FOR BLOOD PRESSURE SUPPORT)
PREFILLED_SYRINGE | INTRAVENOUS | Status: DC | PRN
  Administered 2018-09-18: 80 ug via INTRAVENOUS
  Administered 2018-09-18: 200 ug via INTRAVENOUS
  Administered 2018-09-18 (×2): 80 ug via INTRAVENOUS

## 2018-09-18 MED ORDER — PHENYLEPHRINE HCL (PRESSORS) 10 MG/ML IV SOLN
INTRAVENOUS | Status: AC
  Filled 2018-09-18: qty 2

## 2018-09-18 MED ORDER — PROPOFOL 10 MG/ML IV BOLUS
INTRAVENOUS | Status: DC | PRN
  Administered 2018-09-18: 150 mg via INTRAVENOUS

## 2018-09-18 MED ORDER — ROPIVACAINE HCL 7.5 MG/ML IJ SOLN
INTRAMUSCULAR | Status: DC | PRN
  Administered 2018-09-18: 30 mL via PERINEURAL

## 2018-09-18 MED ORDER — EPHEDRINE 5 MG/ML INJ
INTRAVENOUS | Status: AC
  Filled 2018-09-18: qty 10

## 2018-09-18 SURGICAL SUPPLY — 68 items
BANDAGE ACE 4X5 VEL STRL LF (GAUZE/BANDAGES/DRESSINGS) ×4 IMPLANT
BANDAGE ACE 6X5 VEL STRL LF (GAUZE/BANDAGES/DRESSINGS) ×4 IMPLANT
BANDAGE ESMARK 6X9 LF (GAUZE/BANDAGES/DRESSINGS) IMPLANT
BLADE CORE FAN STRYKER (BLADE) ×4 IMPLANT
BLADE SAW SAG 73X25 THK (BLADE) ×1
BLADE SAW SGTL 73X25 THK (BLADE) ×3 IMPLANT
BNDG COHESIVE 6X5 TAN STRL LF (GAUZE/BANDAGES/DRESSINGS) ×4 IMPLANT
BNDG ESMARK 6X9 LF (GAUZE/BANDAGES/DRESSINGS)
BNDG GAUZE ELAST 4 BULKY (GAUZE/BANDAGES/DRESSINGS) ×8 IMPLANT
CANISTER SUCT 3000ML PPV (MISCELLANEOUS) ×4 IMPLANT
CANISTER WOUNDNEG PRESSURE 500 (CANNISTER) ×4 IMPLANT
CLIP VESOCCLUDE MED 6/CT (CLIP) IMPLANT
COVER BACK TABLE 60X90IN (DRAPES) IMPLANT
COVER SURGICAL LIGHT HANDLE (MISCELLANEOUS) ×8 IMPLANT
COVER WAND RF STERILE (DRAPES) ×4 IMPLANT
CUFF TOURNIQUET SINGLE 24IN (TOURNIQUET CUFF) IMPLANT
CUFF TOURNIQUET SINGLE 34IN LL (TOURNIQUET CUFF) IMPLANT
CUFF TOURNIQUET SINGLE 44IN (TOURNIQUET CUFF) IMPLANT
DRAIN CHANNEL 19F RND (DRAIN) IMPLANT
DRAPE EXTREMITY T 121X128X90 (DISPOSABLE) ×4 IMPLANT
DRAPE HALF SHEET 40X57 (DRAPES) ×4 IMPLANT
DRAPE INCISE IOBAN 66X45 STRL (DRAPES) IMPLANT
DRAPE ORTHO SPLIT 77X108 STRL (DRAPES) ×4
DRAPE SURG ORHT 6 SPLT 77X108 (DRAPES) ×4 IMPLANT
DRSG ADAPTIC 3X8 NADH LF (GAUZE/BANDAGES/DRESSINGS) ×4 IMPLANT
DRSG VAC ATS MED SENSATRAC (GAUZE/BANDAGES/DRESSINGS) ×4 IMPLANT
ELECT CAUTERY BLADE 6.4 (BLADE) ×4 IMPLANT
ELECT REM PT RETURN 9FT ADLT (ELECTROSURGICAL) ×4
ELECTRODE REM PT RTRN 9FT ADLT (ELECTROSURGICAL) ×2 IMPLANT
EVACUATOR SILICONE 100CC (DRAIN) IMPLANT
GAUZE SPONGE 4X4 12PLY STRL (GAUZE/BANDAGES/DRESSINGS) ×8 IMPLANT
GLOVE BIO SURGEON STRL SZ7.5 (GLOVE) ×4 IMPLANT
GLOVE BIOGEL PI IND STRL 6.5 (GLOVE) ×4 IMPLANT
GLOVE BIOGEL PI IND STRL 8 (GLOVE) ×2 IMPLANT
GLOVE BIOGEL PI INDICATOR 6.5 (GLOVE) ×4
GLOVE BIOGEL PI INDICATOR 8 (GLOVE) ×2
GOWN STRL REUS W/ TWL LRG LVL3 (GOWN DISPOSABLE) ×4 IMPLANT
GOWN STRL REUS W/ TWL XL LVL3 (GOWN DISPOSABLE) ×4 IMPLANT
GOWN STRL REUS W/TWL LRG LVL3 (GOWN DISPOSABLE) ×4
GOWN STRL REUS W/TWL XL LVL3 (GOWN DISPOSABLE) ×4
KIT BASIN OR (CUSTOM PROCEDURE TRAY) ×4 IMPLANT
KIT TURNOVER KIT B (KITS) ×4 IMPLANT
NS IRRIG 1000ML POUR BTL (IV SOLUTION) ×4 IMPLANT
PACK GENERAL/GYN (CUSTOM PROCEDURE TRAY) ×4 IMPLANT
PAD ARMBOARD 7.5X6 YLW CONV (MISCELLANEOUS) ×8 IMPLANT
STAPLER VISISTAT 35W (STAPLE) ×4 IMPLANT
STOCKINETTE IMPERVIOUS LG (DRAPES) ×4 IMPLANT
SUT ETHILON 3 0 PS 1 (SUTURE) ×4 IMPLANT
SUT MNCRL AB 4-0 PS2 18 (SUTURE) IMPLANT
SUT SILK 0 TIES 10X30 (SUTURE) IMPLANT
SUT SILK 2 0 (SUTURE) ×2
SUT SILK 2-0 18XBRD TIE 12 (SUTURE) ×2 IMPLANT
SUT SILK 3 0 (SUTURE)
SUT SILK 3 0 SH CR/8 (SUTURE) ×4 IMPLANT
SUT SILK 3 0SH CR/8 30 (SUTURE) ×4 IMPLANT
SUT SILK 3-0 18XBRD TIE 12 (SUTURE) IMPLANT
SUT VIC AB 2-0 CT1 18 (SUTURE) ×8 IMPLANT
SUT VIC AB 2-0 CTX 36 (SUTURE) IMPLANT
SUT VIC AB 3-0 SH 18 (SUTURE) IMPLANT
SUT VIC AB 3-0 SH 27 (SUTURE)
SUT VIC AB 3-0 SH 27X BRD (SUTURE) IMPLANT
SWAB COLLECTION DEVICE MRSA (MISCELLANEOUS) ×4 IMPLANT
SWAB CULTURE LIQUID MINI MALE (MISCELLANEOUS) ×4 IMPLANT
TAPE UMBILICAL COTTON 1/8X30 (MISCELLANEOUS) ×4 IMPLANT
TOWEL GREEN STERILE (TOWEL DISPOSABLE) ×8 IMPLANT
TOWEL GREEN STERILE FF (TOWEL DISPOSABLE) ×4 IMPLANT
UNDERPAD 30X30 (UNDERPADS AND DIAPERS) ×4 IMPLANT
WATER STERILE IRR 1000ML POUR (IV SOLUTION) ×4 IMPLANT

## 2018-09-18 NOTE — Progress Notes (Signed)
Pt received from PACU. Vital stable. PT denies any pain. Tlebox applied/ccmd notified. Doppler used DP +1.  Will continue to monitor.  Jerald Kief, RN

## 2018-09-18 NOTE — Progress Notes (Signed)
Dr Carlis Abbott notified ref to multiple dressing reinforcements. Pt still continues to bleed. Will await order and reinforce again.  Jerald Kief, RN

## 2018-09-18 NOTE — Anesthesia Procedure Notes (Signed)
Procedure Name: LMA Insertion Date/Time: 09/18/2018 10:54 AM Performed by: Gwyndolyn Saxon, CRNA Pre-anesthesia Checklist: Patient identified, Emergency Drugs available, Suction available and Patient being monitored Patient Re-evaluated:Patient Re-evaluated prior to induction Oxygen Delivery Method: Circle system utilized Preoxygenation: Pre-oxygenation with 100% oxygen Induction Type: IV induction Ventilation: Mask ventilation without difficulty LMA: LMA inserted LMA Size: 4.0 Number of attempts: 1 Placement Confirmation: positive ETCO2 and breath sounds checked- equal and bilateral Tube secured with: Tape Dental Injury: Teeth and Oropharynx as per pre-operative assessment

## 2018-09-18 NOTE — Progress Notes (Signed)
[facility]  Subjective  - No complaints.  Thinks left foot feels better.   Objective (!) 113/58 65 97.9 F (36.6 C) (Oral) 15 93%  Intake/Output Summary (Last 24 hours) at 09/18/2018 0917 Last data filed at 09/18/2018 0400 Gross per 24 hour  Intake 1285.17 ml  Output 125 ml  Net 1160.17 ml    Right groin c/d/i, no hematoma Left DP/Pt signals brisk Necrotic left 2nd 3rd toe amps  Laboratory Lab Results: Recent Labs    09/17/18 1642 09/18/18 0642  WBC 14.5* 11.5*  HGB 12.0* 11.4*  HCT 36.3* 34.1*  PLT 474* 473*   BMET Recent Labs    09/17/18 1048 09/17/18 1642 09/18/18 0642  NA 132*  --  134*  K 5.4*  --  4.2  CL 102  --  100  CO2  --   --  24  GLUCOSE 148*  --  119*  BUN 17  --  8  CREATININE 0.80 0.81 0.85  CALCIUM  --   --  8.2*    COAG Lab Results  Component Value Date   INR 1.1 09/08/2018   No results found for: PTT  Assessment/Planning:  Doing well POD#1 s/p revascularization LLE.  Brisk DP/PT signals.  Foot actually looks a bit better.  Plan for I&D left foot with possible TMA vs BKA.  Will remain on vanc/zosyn for now.  Had profound foot cellulitis on admission with previous evidence osteo.   Marty Heck 09/18/2018 9:17 AM --

## 2018-09-18 NOTE — Anesthesia Procedure Notes (Signed)
Anesthesia Regional Block: Popliteal block   Pre-Anesthetic Checklist: ,, timeout performed, Correct Patient, Correct Site, Correct Laterality, Correct Procedure, Correct Position, site marked, Risks and benefits discussed,  Surgical consent,  Pre-op evaluation,  At surgeon's request and post-op pain management  Laterality: Left  Prep: chloraprep       Needles:  Injection technique: Single-shot  Needle Type: Stimiplex     Needle Length: 10cm  Needle Gauge: 21     Additional Needles:   Procedures:,,,, ultrasound used (permanent image in chart),,,,  Motor weakness within 5 minutes.   Nerve Stimulator or Paresthesia:  Response: 0.5 mA,   Additional Responses:   Narrative:  Start time: 09/18/2018 10:12 AM End time: 09/18/2018 10:17 AM Injection made incrementally with aspirations every 5 mL.  Performed by: Personally  Anesthesiologist: Nolon Nations, MD  Additional Notes: Nerve located and needle positioned with direct ultrasound guidance. Good perineural spread. Patient tolerated well.

## 2018-09-18 NOTE — Anesthesia Postprocedure Evaluation (Signed)
Anesthesia Post Note  Patient: Thomas Garcia  Procedure(s) Performed: TRANSMETATARSAL AMPUTATION LEFT FOOT (Left ) APPLICATION OF WOUND VAC (Left Foot)     Patient location during evaluation: PACU Anesthesia Type: General Level of consciousness: sedated and patient cooperative Pain management: pain level controlled Vital Signs Assessment: post-procedure vital signs reviewed and stable Respiratory status: spontaneous breathing Cardiovascular status: stable Anesthetic complications: no    Last Vitals:  Vitals:   09/18/18 0800 09/18/18 1200  BP: (!) 113/58 129/60  Pulse: 65 83  Resp: 15 16  Temp: 36.6 C 36.7 C  SpO2: 93% 99%    Last Pain:  Vitals:   09/18/18 1200  TempSrc:   PainSc: Asleep    LLE Motor Response: Purposeful movement (09/18/18 1200) LLE Sensation: Full sensation (09/18/18 1200)          Nolon Nations

## 2018-09-18 NOTE — Transfer of Care (Signed)
Immediate Anesthesia Transfer of Care Note  Patient: Thomas Garcia  Procedure(s) Performed: TRANSMETATARSAL AMPUTATION LEFT FOOT (Left ) APPLICATION OF WOUND VAC (Left Foot)  Patient Location: PACU  Anesthesia Type:General  Level of Consciousness: awake, alert  and oriented  Airway & Oxygen Therapy: Patient Spontanous Breathing and Patient connected to nasal cannula oxygen  Post-op Assessment: Report given to RN, Post -op Vital signs reviewed and stable and Patient moving all extremities X 4  Post vital signs: Reviewed and stable  Last Vitals:  Vitals Value Taken Time  BP 129/60 09/18/18 1200  Temp    Pulse 83 09/18/18 1200  Resp 16 09/18/18 1200  SpO2 99 % 09/18/18 1200  Vitals shown include unvalidated device data.  Last Pain:  Vitals:   09/18/18 0911  TempSrc:   PainSc: 0-No pain         Complications: No apparent anesthesia complications

## 2018-09-18 NOTE — Progress Notes (Signed)
Inpatient Diabetes Program Recommendations  AACE/ADA: New Consensus Statement on Inpatient Glycemic Control (2015)  Target Ranges:  Prepandial:   less than 140 mg/dL      Peak postprandial:   less than 180 mg/dL (1-2 hours)      Critically ill patients:  140 - 180 mg/dL   Lab Results  Component Value Date   GLUCAP 109 (H) 09/18/2018   HGBA1C 9.0 (H) 09/08/2018    Review of Glycemic Control Results for [name], Thomas Garcia (MRN [id number]) as of 09/18/2018 12:47  Ref. Range 09/17/2018 14:33 09/17/2018 16:38 09/18/2018 08:44 09/18/2018 09:16 09/18/2018 11:59  Glucose-Capillary Latest Ref Range: 70 - 99 mg/dL 127 (H) 97 113 (H) 111 (H) 109 (H)   Diabetes history: DM 2 Outpatient Diabetes medications: Metformin 500 mg bid Current orders for Inpatient glycemic control:  None  Inpatient Diabetes Program Recommendations:    Note elevated A1C.  May need adjustment in outpatient medications.  Also please add Novolog sensitive tid with meals and HS.   Thanks,  Adah Perl, RN, BC-ADM Inpatient Diabetes Coordinator Pager [phone] (8a-5p)

## 2018-09-18 NOTE — Progress Notes (Signed)
Called to address alarm in the room. Wound vac alarming. Bleeding from left foot. S/P  Surgery of the foot. PA called.

## 2018-09-18 NOTE — Op Note (Signed)
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  Left transmetatarsal amputation (wound left open) 2.  Negative pressure wound VAC placement to left transmetatarsal amputation  Surgeon: Dr. Marty Heck, MD  Assistant: Roxy Horseman, [place]  Indications: Patient is a 78 year old male that was seen in clinic on Tuesday with critical limb ischemia of his left lower extremity.  He underwent toe amputations of his left second and third toes that have been nonhealing and now appear necrotic.  In addition he had a fairly frank cellulitis of the foot with a fair amount of tenderness on the plantar surface.  He underwent revascularization yesterday with left SFA and anterior tibial artery angioplasty.  He presents today for TMA versus BKA after risks and benefits were discussed.  Findings: Fishmouth incision was made with left transmetatarsal amputation.  We found a large abscess cavity on the plantar surface of the foot where he has been having a lot of tenderness and some of the underlying muscle here was necrotic.  Ultimately I revised the transmetatarsal amputation back further and debrided significant amount of tissue on the plantar surface flap until everything appeared healthy.  There was excellent pulsatile bleeding.  Negative pressure wound VAC was placed.  Discussed I will bring him back on Monday for delayed primary closure versus BKA if the flap fails over the weekend.  Anesthesia: LMA  Details: The patient was taken to the operating room after informed consent was obtained.  He was placed on operative table in the supine position and his left leg was prepped and draped in the usual sterile fashion.  Preop timeout was performed to identify patient, procedure and site.  Initially marked out a fishmouth incision on  the left foot approximately at healthy tissue margins.  Ultimately incision was made with a 15 blade scalpel was carried down to the metatarsal bone.  I then used Bovie cautery and isolated all the metatarsals on the dorsum of the foot.  These were then transected with oscillating saw.  I then completed the posterior flap with Bovie cautery.  Ultimately we entered a large abscess cavity on the plantar surface of the foot that tracked posteriorly.  There was a fair amount of tissue on the plantar flap that looked necrotic and ultimately Bovie cautery was used to debride this tissue back.  Ultimately in order to get healthier tissue margins I then made a new fishmouth incision with a 15 blade scalpel and then used an oscillating saw to take more metatarsal bone back another 2 or 3 inches.  I then felt like I had reasonable tissue on the plantar flap that looked viable after debriding back the necrotic stuff and widely opening the abscess cavity.  It should be noted that we did take cultures for Gram stain.  The wound was then copious irrigated with saline until the effluent was clear.  Given the underlying infection, I elected to leave the flap open.  A medium sponge was cut to size and a negative pressure wound VAC was applied.  He was taken to PACU in stable condition.  Condition: Stable  Complications: None  Marty Heck, MD [facility] [place] Office: [phone] Pager: [place]

## 2018-09-18 NOTE — Progress Notes (Signed)
Pt partial amputation continues to bleed through dressings. Reinforced with abd pad, cling, and ace wrap. Will continue to monitor. Pt has +1 dp.  Jerald Kief, RN

## 2018-09-18 NOTE — Anesthesia Preprocedure Evaluation (Addendum)
Anesthesia Evaluation  Patient identified by MRN, date of birth, ID band Patient awake    Reviewed: Allergy & Precautions, NPO status , Patient's Chart, lab work & pertinent test results  History of Anesthesia Complications (+) PONV and history of anesthetic complications  Airway Mallampati: II       Dental  (+) Poor Dentition, Dental Advisory Given   Pulmonary neg pulmonary ROS, Current Smoker,    breath sounds clear to auscultation       Cardiovascular + Peripheral Vascular Disease   Rhythm:regular  Echo 09/09/2018  1. The left ventricle has normal systolic function with an ejection fraction of 60-65%. The cavity size was normal. There is mild concentric left ventricular hypertrophy. Left ventricular diastolic Doppler parameters are consistent with impaired relaxation. Indeterminate filling pressures No evidence of left ventricular regional wall motion abnormalities.  2. The right ventricle has normal systolic function. The cavity was normal. There is no increase in right ventricular wall thickness.  3. The mitral valve is grossly normal. There is moderate mitral annular calcification present.  4. The tricuspid valve is grossly normal.  5. The aortic valve is tricuspid.  6. The aortic root is normal in size and structure.   Neuro/Psych negative neurological ROS  negative psych ROS   GI/Hepatic negative GI ROS, Neg liver ROS,   Endo/Other  diabetes, Type 2  Renal/GU Renal disease     Musculoskeletal negative musculoskeletal ROS (+)   Abdominal   Peds  Hematology negative hematology ROS (+)   Anesthesia Other Findings DM2, PVD with osteomyelitis, wet gangrene and cellulitis Ongoing tobacco abuse No previous medical care with approx 30 yr h/o DM Denies any experience of CP or other CV disease  Reproductive/Obstetrics                            Anesthesia Physical  Anesthesia Plan  ASA:  III  Anesthesia Plan:    Post-op Pain Management:    Induction: Intravenous  PONV Risk Score and Plan:   Airway Management Planned:   Additional Equipment:   Intra-op Plan:   Post-operative Plan:   Informed Consent: I have reviewed the patients History and Physical, chart, labs and discussed the procedure including the risks, benefits and alternatives for the proposed anesthesia with the patient or authorized representative who has indicated his/her understanding and acceptance.     Dental advisory given  Plan Discussed with: CRNA  Anesthesia Plan Comments:        Anesthesia Quick Evaluation

## 2018-09-18 NOTE — Progress Notes (Signed)
   I was called to patient's bedside for persistent bleeding.  All dressings were taken down there were 2 areas that were directly using with diffuse ooze from most of the wound bed there was some clot present which are left in place.  I placed no throughout the wound packed Kerlix tightly and wrapped with Ace wrap.  He tolerated this procedure very well we will continue to monitor.  Selma Mink C. Donzetta Matters, MD [facility] [place] Office: [phone] Pager: [phone]

## 2018-09-19 ENCOUNTER — Encounter (HOSPITAL_COMMUNITY): Payer: Self-pay | Admitting: Vascular Surgery

## 2018-09-19 LAB — GLUCOSE, CAPILLARY: Glucose-Capillary: 157 mg/dL — ABNORMAL HIGH (ref 70–99)

## 2018-09-19 NOTE — Care Management Important Message (Signed)
Important Message  Patient Details  Name: STEFANOS HAYNESWORTH MRN: [id number] Date of Birth: 1940/10/27   Medicare Important Message Given:  Yes    Shelda Altes 09/19/2018, 11:52 AM

## 2018-09-19 NOTE — Progress Notes (Signed)
Orthopedic Tech Progress Note Patient Details:  Thomas Garcia [birth year] [id number] Called in order to [facility] for [name] Patient ID: Thomas Garcia, male   DOB: [birth year], 78 y.o.   MRN: [id number]   [name] 09/19/2018, 8:38 AM

## 2018-09-19 NOTE — Progress Notes (Addendum)
  Progress Note    09/19/2018 7:55 AM 1 Day Post-Op  Subjective:  Wound vac removed overnight due to persistent bleeding.  No complaints this morning from patient   Vitals:   09/19/18 0627 09/19/18 0700  BP: 126/62 (!) 150/66  Pulse: 80 71  Resp: 17 15  Temp: 97.7 F (36.5 C)   SpO2: 97% 99%   Physical Exam: Lungs:  Non labored Incisions:  L foot wound without purulence or continued bleeding Extremities:  DP by doppler Neurologic: A&O  CBC    Component Value Date/Time   WBC 11.5 (H) 09/18/2018 0642   RBC 3.85 (L) 09/18/2018 0642   HGB 11.4 (L) 09/18/2018 0642   HCT 34.1 (L) 09/18/2018 0642   PLT 473 (H) 09/18/2018 0642   MCV 88.6 09/18/2018 0642   MCH 29.6 09/18/2018 0642   MCHC 33.4 09/18/2018 0642   RDW 13.8 09/18/2018 0642   LYMPHSABS 1.8 09/11/2018 0535   MONOABS 1.2 (H) 09/11/2018 0535   EOSABS 0.0 09/11/2018 0535   BASOSABS 0.0 09/11/2018 0535    BMET    Component Value Date/Time   NA 134 (L) 09/18/2018 0642   K 4.2 09/18/2018 0642   CL 100 09/18/2018 0642   CO2 24 09/18/2018 0642   GLUCOSE 119 (H) 09/18/2018 0642   BUN 8 09/18/2018 0642   CREATININE 0.85 09/18/2018 0642   CALCIUM 8.2 (L) 09/18/2018 0642   GFRNONAA >60 09/18/2018 0642   GFRAA >60 09/18/2018 0642    INR    Component Value Date/Time   INR 1.1 09/08/2018 1124     Intake/Output Summary (Last 24 hours) at 09/19/2018 0755 Last data filed at 09/19/2018 0524 Gross per 24 hour  Intake 1965.23 ml  Output 2440 ml  Net -474.77 ml     Assessment/Plan:  78 y.o. male is s/p L TMA  1 Day Post-Op   Wet to dry dressing changed Darco shoe ordered, heel weightbearing only Continue IV antibiotics Plan is for wound care with dressing changes over the weekend with return to OR Monday for possible closure of wound; patient is aware he remains at risk for BKA   Dagoberto Ligas, PA-C [facility] [phone] 09/19/2018 7:55 AM  I have seen and evaluated the patient. I  agree with the PA note as documented above. Brisk L DP/PT signals.  TMA yesterday left open.  Wound vac removed and packed due to bleeding.  Hemostatic this am and dressing changed.  Will need wet to dry dressing change through weekend.  OR Monday for delayed primary closure if tissue looks ok through weekend.  Marty Heck, MD [facility] [place] Office: [phone] Pager: [phone]

## 2018-09-19 NOTE — Progress Notes (Signed)
Orthopedic Tech Progress Note Patient Details:  Thomas Garcia March 14, 1941 [id number] [facility] came out and asked if we could give him one of our [place] they are short on those shoes. Went up to patient room and patient is bleeding heavily. RN said she would need to change the dressing. I told her I would return once she's ready for the shoe. But I did leave the shoe at the bedside. Ortho Devices Type of Ortho Device: Darco shoe Ortho Device/Splint Location: LLE Ortho Device/Splint Interventions: Other (comment)   Post Interventions Patient Tolerated: Other (comment) Instructions Provided: Other (comment)   [name] 09/19/2018, 9:24 AM

## 2018-09-20 LAB — VANCOMYCIN, TROUGH: Vancomycin Tr: 16 ug/mL (ref 15–20)

## 2018-09-20 LAB — GLUCOSE, CAPILLARY: Glucose-Capillary: 110 mg/dL — ABNORMAL HIGH (ref 70–99)

## 2018-09-20 LAB — VANCOMYCIN, RANDOM: Vancomycin Rm: 40

## 2018-09-20 MED ORDER — VANCOMYCIN HCL IN DEXTROSE 1-5 GM/200ML-% IV SOLN
1000.0000 mg | Freq: Two times a day (BID) | INTRAVENOUS | Status: DC
  Administered 2018-09-21 – 2018-09-24 (×7): 1000 mg via INTRAVENOUS
  Filled 2018-09-20 (×8): qty 200

## 2018-09-20 NOTE — Progress Notes (Signed)
Pharmacy Antibiotic Note  Thomas Garcia is a 78 y.o. male admitted on 09/17/2018 with cellulitis/foot abscess.  Pharmacy has been consulted for vancomycin and zosyn dosing.  SCr has remained stable, cultures this admit are negative.  Plan: -Reduce vancomycin to 1000mg  IV q12h - est AUC 502 -Continue Zosyn 3.375g IV EI q8h  Height: 5\' 9"  (175.3 cm) Weight: 185 lb (83.9 kg) IBW/kg (Calculated) : 70.7  Temp (24hrs), Avg:97.9 F (36.6 C), Min:97.6 F (36.4 C), Max:98.1 F (36.7 C)  Recent Labs  Lab 09/17/18 1048 09/17/18 1642 09/18/18 0642 09/20/18 1657 09/20/18 1919  WBC  --  14.5* 11.5*  --   --   CREATININE 0.80 0.81 0.85  --   --   VANCOTROUGH  --   --   --  16  --   VANCORANDOM  --   --   --   --  40    Estimated Creatinine Clearance: 73.9 mL/min (by C-G formula based on SCr of 0.85 mg/dL).    No Known Allergies  Antimicrobials this admission:  Vanc 6/10 >  Zosyn 6/10 >   Dose adjustments this admission:  6/13: Vp 40, Vt 16, AUC 628 > reduce to 1000mg  q12h  Microbiology results:  6/9 COVID neg   Arrie Senate, PharmD, BCPS Clinical Pharmacist [phone] Please check AMION for all [place] numbers 09/20/2018

## 2018-09-20 NOTE — Progress Notes (Signed)
    Subjective  - POD #2  No complaints   Physical Exam:    Left foot dressing changed.  Tissue appears to be viable.  There is still bleeding from the deep tissue.  Wet-to-dry dressing was replaced.     Assessment/Plan:  POD #2  Continue with wet-to-dry dressing changes and return trip to the operating room on Monday for possible closure versus more proximal amputation.  Wells Brabham 09/20/2018 1:30 PM --  Vitals:   09/20/18 0756 09/20/18 1100  BP: 127/82 137/66  Pulse: 80 82  Resp: 17 14  Temp: 97.6 F (36.4 C) 98 F (36.7 C)  SpO2:      Intake/Output Summary (Last 24 hours) at 09/20/2018 1330 Last data filed at 09/20/2018 0803 Gross per 24 hour  Intake 50 ml  Output 2050 ml  Net -2000 ml     Laboratory CBC    Component Value Date/Time   WBC 11.5 (H) 09/18/2018 0642   HGB 11.4 (L) 09/18/2018 0642   HCT 34.1 (L) 09/18/2018 0642   PLT 473 (H) 09/18/2018 0642    BMET    Component Value Date/Time   NA 134 (L) 09/18/2018 0642   K 4.2 09/18/2018 0642   CL 100 09/18/2018 0642   CO2 24 09/18/2018 0642   GLUCOSE 119 (H) 09/18/2018 0642   BUN 8 09/18/2018 0642   CREATININE 0.85 09/18/2018 0642   CALCIUM 8.2 (L) 09/18/2018 0642   GFRNONAA >60 09/18/2018 0642   GFRAA >60 09/18/2018 0642    COAG Lab Results  Component Value Date   INR 1.1 09/08/2018   No results found for: PTT  Antibiotics Anti-infectives (From admission, onward)   Start     Dose/Rate Route Frequency Ordered Stop   09/17/18 1700  vancomycin (VANCOCIN) 1,250 mg in sodium chloride 0.9 % 250 mL IVPB     1,250 mg 166.7 mL/hr over 90 Minutes Intravenous Every 12 hours 09/17/18 1659     09/17/18 1700  piperacillin-tazobactam (ZOSYN) IVPB 3.375 g     3.375 g 12.5 mL/hr over 240 Minutes Intravenous Every 8 hours 09/17/18 1659     09/17/18 1645  piperacillin-tazobactam (ZOSYN) IVPB 3.375 g  Status:  Discontinued     3.375 g 100 mL/hr over 30 Minutes Intravenous  Once 09/17/18 1644  09/17/18 1844   09/17/18 1645  vancomycin (VANCOCIN) IVPB 1000 mg/200 mL premix  Status:  Discontinued     1,000 mg 200 mL/hr over 60 Minutes Intravenous  Once 09/17/18 1644 09/17/18 1914       V. Leia Alf, M.D., [facility] [place] Office: [phone] Pager:  [phone]

## 2018-09-21 LAB — GLUCOSE, CAPILLARY: Glucose-Capillary: 184 mg/dL — ABNORMAL HIGH (ref 70–99)

## 2018-09-21 NOTE — Progress Notes (Addendum)
[facility] [place]  Subjective  - comfortable.   Objective (!) 132/53 63 98 F (36.7 C) (Oral) 16 98%  Intake/Output Summary (Last 24 hours) at 09/21/2018 3149 Last data filed at 09/21/2018 7026 Gross per 24 hour  Intake 540 ml  Output 775 ml  Net -235 ml    Left foot dressing clean and dry Lungs non labored breathing DP doppler intact left LE  Assessment/Planning: POD # 3 open transmetatarsal amputation NPO past MN  Plan secondary closure verses transtibial amputation  Roxy Horseman 09/21/2018 8:21 AM --  Laboratory Lab Results: No results for input(s): WBC, HGB, HCT, PLT in the last 72 hours. BMET No results for input(s): NA, K, CL, CO2, GLUCOSE, BUN, CREATININE, CALCIUM in the last 72 hours.  COAG Lab Results  Component Value Date   INR 1.1 09/08/2018   No results found for: PTT   I agree with the above.  I have seen and examined the patient.  Dressing dry without bleeding.  Plan for OR tomorrow.  Check CBC in am.   Annamarie Major

## 2018-09-22 ENCOUNTER — Inpatient Hospital Stay (HOSPITAL_COMMUNITY): Payer: Medicare Other | Admitting: Registered Nurse

## 2018-09-22 ENCOUNTER — Encounter (HOSPITAL_COMMUNITY): Payer: Self-pay | Admitting: Certified Registered Nurse Anesthetist

## 2018-09-22 ENCOUNTER — Encounter (HOSPITAL_COMMUNITY)
Admission: RE | Disposition: A | Discharge status: Rehabilitation Facility | Payer: Self-pay | Source: Home / Self Care | Attending: Vascular Surgery

## 2018-09-22 DIAGNOSIS — T8781 Dehiscence of amputation stump: Secondary | ICD-10-CM

## 2018-09-22 HISTORY — PX: AMPUTATION: SHX166

## 2018-09-22 LAB — CBC
HCT: 26 % — ABNORMAL LOW (ref 39.0–52.0)
Hemoglobin: 8.6 g/dL — ABNORMAL LOW (ref 13.0–17.0)
MCH: 29.6 pg (ref 26.0–34.0)
MCHC: 33.1 g/dL (ref 30.0–36.0)
MCV: 89.3 fL (ref 80.0–100.0)
Platelets: 486 10*3/uL — ABNORMAL HIGH (ref 150–400)
RBC: 2.91 MIL/uL — ABNORMAL LOW (ref 4.22–5.81)
RDW: 13.9 % (ref 11.5–15.5)
WBC: 14.2 10*3/uL — ABNORMAL HIGH (ref 4.0–10.5)
nRBC: 0 % (ref 0.0–0.2)

## 2018-09-22 LAB — BASIC METABOLIC PANEL
Anion gap: 7 (ref 5–15)
BUN: 11 mg/dL (ref 8–23)
CO2: 25 mmol/L (ref 22–32)
Calcium: 8.2 mg/dL — ABNORMAL LOW (ref 8.9–10.3)
Chloride: 104 mmol/L (ref 98–111)
Creatinine, Ser: 0.86 mg/dL (ref 0.61–1.24)
GFR calc Af Amer: >60 mL/min (ref >60)
GFR calc non Af Amer: >60 mL/min (ref >60)
Glucose, Bld: 167 mg/dL — ABNORMAL HIGH (ref 70–99)
Potassium: 3.5 mmol/L (ref 3.5–5.1)
Sodium: 136 mmol/L (ref 135–145)

## 2018-09-22 LAB — GLUCOSE, CAPILLARY
Glucose-Capillary: 201 mg/dL — ABNORMAL HIGH (ref 70–99)
Glucose-Capillary: 158 mg/dL — ABNORMAL HIGH (ref 70–99)
Glucose-Capillary: 165 mg/dL — ABNORMAL HIGH (ref 70–99)

## 2018-09-22 SURGERY — AMPUTATION BELOW KNEE
Anesthesia: General | Site: Leg Lower | Laterality: Left

## 2018-09-22 MED ORDER — METHOCARBAMOL 500 MG PO TABS
ORAL_TABLET | ORAL | Status: AC
  Filled 2018-09-22: qty 1

## 2018-09-22 MED ORDER — PANTOPRAZOLE SODIUM 40 MG PO TBEC
40.0000 mg | DELAYED_RELEASE_TABLET | Freq: Every day | ORAL | Status: DC
  Administered 2018-09-23 – 2018-09-24 (×2): 40 mg via ORAL
  Filled 2018-09-22 (×2): qty 1

## 2018-09-22 MED ORDER — BACITRACIN ZINC 500 UNIT/GM EX OINT
TOPICAL_OINTMENT | CUTANEOUS | Status: DC | PRN
  Administered 2018-09-22: 1 via TOPICAL

## 2018-09-22 MED ORDER — PROPOFOL 10 MG/ML IV BOLUS
INTRAVENOUS | Status: DC | PRN
  Administered 2018-09-22: 150 mg via INTRAVENOUS

## 2018-09-22 MED ORDER — METOPROLOL TARTRATE 5 MG/5ML IV SOLN: 2.0000 mg | INTRAVENOUS | Status: DC | PRN

## 2018-09-22 MED ORDER — HEPARIN SODIUM (PORCINE) 5000 UNIT/ML IJ SOLN
5000.0000 [IU] | Freq: Three times a day (TID) | INTRAMUSCULAR | Status: DC
  Administered 2018-09-23 – 2018-09-24 (×5): 5000 [IU] via SUBCUTANEOUS
  Filled 2018-09-22 (×5): qty 1

## 2018-09-22 MED ORDER — ACETAMINOPHEN 10 MG/ML IV SOLN
1000.0000 mg | Freq: Once | INTRAVENOUS | Status: AC
  Administered 2018-09-22: 1000 mg via INTRAVENOUS

## 2018-09-22 MED ORDER — POTASSIUM CHLORIDE CRYS ER 20 MEQ PO TBCR: 20.0000 meq | EXTENDED_RELEASE_TABLET | Freq: Every day | ORAL | Status: DC | PRN

## 2018-09-22 MED ORDER — LACTATED RINGERS IV SOLN
INTRAVENOUS | Status: DC
  Administered 2018-09-22: 14:00:00 via INTRAVENOUS

## 2018-09-22 MED ORDER — FENTANYL CITRATE (PF) 250 MCG/5ML IJ SOLN
INTRAMUSCULAR | Status: DC | PRN
  Administered 2018-09-22 (×5): 50 ug via INTRAVENOUS

## 2018-09-22 MED ORDER — MORPHINE SULFATE (PF) 2 MG/ML IV SOLN
2.0000 mg | INTRAVENOUS | Status: DC | PRN
  Administered 2018-09-22: 2 mg via INTRAVENOUS
  Filled 2018-09-22: qty 1

## 2018-09-22 MED ORDER — ACETAMINOPHEN 10 MG/ML IV SOLN
INTRAVENOUS | Status: AC
  Filled 2018-09-22: qty 100

## 2018-09-22 MED ORDER — SODIUM CHLORIDE 0.9 % IV SOLN
INTRAVENOUS | Status: DC
  Administered 2018-09-22: 18:00:00 via INTRAVENOUS

## 2018-09-22 MED ORDER — EPHEDRINE SULFATE-NACL 50-0.9 MG/10ML-% IV SOSY
PREFILLED_SYRINGE | INTRAVENOUS | Status: DC | PRN
  Administered 2018-09-22: 10 mg via INTRAVENOUS

## 2018-09-22 MED ORDER — 0.9 % SODIUM CHLORIDE (POUR BTL) OPTIME
TOPICAL | Status: DC | PRN
  Administered 2018-09-22: 15:00:00 1000 mL

## 2018-09-22 MED ORDER — GUAIFENESIN-DM 100-10 MG/5ML PO SYRP: 15.0000 mL | ORAL_SOLUTION | ORAL | Status: DC | PRN

## 2018-09-22 MED ORDER — BACITRACIN ZINC 500 UNIT/GM EX OINT
TOPICAL_OINTMENT | CUTANEOUS | Status: AC
  Filled 2018-09-22: qty 28.35

## 2018-09-22 MED ORDER — PHENYLEPHRINE 40 MCG/ML (10ML) SYRINGE FOR IV PUSH (FOR BLOOD PRESSURE SUPPORT)
PREFILLED_SYRINGE | INTRAVENOUS | Status: DC | PRN
  Administered 2018-09-22 (×3): 80 ug via INTRAVENOUS

## 2018-09-22 MED ORDER — HYDROMORPHONE HCL 1 MG/ML IJ SOLN
INTRAMUSCULAR | Status: AC
  Filled 2018-09-22: qty 1

## 2018-09-22 MED ORDER — DOCUSATE SODIUM 100 MG PO CAPS
100.0000 mg | ORAL_CAPSULE | Freq: Every day | ORAL | Status: DC
  Filled 2018-09-22 (×2): qty 1

## 2018-09-22 MED ORDER — MIDAZOLAM HCL 2 MG/2ML IJ SOLN
0.5000 mg | Freq: Once | INTRAMUSCULAR | Status: AC
  Administered 2018-09-22: 0.5 mg via INTRAVENOUS

## 2018-09-22 MED ORDER — ONDANSETRON HCL 4 MG/2ML IJ SOLN
INTRAMUSCULAR | Status: DC | PRN
  Administered 2018-09-22: 4 mg via INTRAVENOUS

## 2018-09-22 MED ORDER — ALUM & MAG HYDROXIDE-SIMETH 200-200-20 MG/5ML PO SUSP: 15.0000 mL | ORAL | Status: DC | PRN

## 2018-09-22 MED ORDER — PHENOL 1.4 % MT LIQD: 1.0000 | OROMUCOSAL | Status: DC | PRN

## 2018-09-22 MED ORDER — HYDROMORPHONE HCL 1 MG/ML IJ SOLN
0.2500 mg | INTRAMUSCULAR | Status: AC | PRN
  Administered 2018-09-22 (×4): 0.5 mg via INTRAVENOUS

## 2018-09-22 MED ORDER — PIPERACILLIN-TAZOBACTAM 3.375 G IVPB 30 MIN
3.3750 g | INTRAVENOUS | Status: AC
  Administered 2018-09-22: 3.375 g via INTRAVENOUS
  Filled 2018-09-22: qty 50

## 2018-09-22 MED ORDER — DEXAMETHASONE SODIUM PHOSPHATE 10 MG/ML IJ SOLN
INTRAMUSCULAR | Status: DC | PRN
  Administered 2018-09-22: 5 mg via INTRAVENOUS

## 2018-09-22 MED ORDER — LIDOCAINE HCL (CARDIAC) PF 100 MG/5ML IV SOSY
PREFILLED_SYRINGE | INTRAVENOUS | Status: DC | PRN
  Administered 2018-09-22: 100 mg via INTRAVENOUS

## 2018-09-22 MED ORDER — MAGNESIUM SULFATE 2 GM/50ML IV SOLN: 2.0000 g | Freq: Every day | INTRAVENOUS | Status: DC | PRN

## 2018-09-22 MED ORDER — FENTANYL CITRATE (PF) 250 MCG/5ML IJ SOLN
INTRAMUSCULAR | Status: AC
  Filled 2018-09-22: qty 5

## 2018-09-22 MED ORDER — MIDAZOLAM HCL 2 MG/2ML IJ SOLN
INTRAMUSCULAR | Status: AC
  Filled 2018-09-22: qty 2

## 2018-09-22 MED ORDER — FENTANYL CITRATE (PF) 100 MCG/2ML IJ SOLN
25.0000 ug | INTRAMUSCULAR | Status: DC | PRN
  Administered 2018-09-22 (×2): 25 ug via INTRAVENOUS
  Administered 2018-09-22: 50 ug via INTRAVENOUS

## 2018-09-22 MED ORDER — SODIUM CHLORIDE 0.9 % IV SOLN
INTRAVENOUS | Status: DC | PRN
  Administered 2018-09-22: 15:00:00 30 ug/min via INTRAVENOUS

## 2018-09-22 MED ORDER — FENTANYL CITRATE (PF) 100 MCG/2ML IJ SOLN
INTRAMUSCULAR | Status: AC
  Filled 2018-09-22: qty 2

## 2018-09-22 MED ORDER — GLYCOPYRROLATE PF 0.2 MG/ML IJ SOSY
PREFILLED_SYRINGE | INTRAMUSCULAR | Status: AC
  Filled 2018-09-22: qty 4

## 2018-09-22 MED ORDER — METHOCARBAMOL 500 MG PO TABS
500.0000 mg | ORAL_TABLET | Freq: Once | ORAL | Status: AC
  Administered 2018-09-22: 500 mg via ORAL

## 2018-09-22 SURGICAL SUPPLY — 59 items
BANDAGE ACE 4X5 VEL STRL LF (GAUZE/BANDAGES/DRESSINGS) IMPLANT
BANDAGE ACE 6X5 VEL STRL LF (GAUZE/BANDAGES/DRESSINGS) IMPLANT
BANDAGE ELASTIC 4 VELCRO ST LF (GAUZE/BANDAGES/DRESSINGS) IMPLANT
BANDAGE ESMARK 6X9 LF (GAUZE/BANDAGES/DRESSINGS) IMPLANT
BLADE CORE FAN STRYKER (BLADE) IMPLANT
BLADE SAW SAG 73X25 THK (BLADE) ×1
BLADE SAW SGTL 73X25 THK (BLADE) ×3 IMPLANT
BNDG COHESIVE 6X5 TAN STRL LF (GAUZE/BANDAGES/DRESSINGS) ×4 IMPLANT
BNDG ESMARK 6X9 LF (GAUZE/BANDAGES/DRESSINGS)
BNDG GAUZE ELAST 4 BULKY (GAUZE/BANDAGES/DRESSINGS) ×4 IMPLANT
CANISTER SUCT 3000ML PPV (MISCELLANEOUS) ×4 IMPLANT
CLIP VESOCCLUDE MED 6/CT (CLIP) ×4 IMPLANT
COVER BACK TABLE 60X90IN (DRAPES) IMPLANT
COVER SURGICAL LIGHT HANDLE (MISCELLANEOUS) IMPLANT
COVER WAND RF STERILE (DRAPES) ×4 IMPLANT
CUFF TOURNIQUET SINGLE 24IN (TOURNIQUET CUFF) IMPLANT
CUFF TOURNIQUET SINGLE 34IN LL (TOURNIQUET CUFF) IMPLANT
CUFF TOURNIQUET SINGLE 44IN (TOURNIQUET CUFF) IMPLANT
DRAIN CHANNEL 19F RND (DRAIN) IMPLANT
DRAPE EXTREMITY T 121X128X90 (DISPOSABLE) ×4 IMPLANT
DRAPE HALF SHEET 40X57 (DRAPES) ×4 IMPLANT
DRAPE ORTHO SPLIT 77X108 STRL (DRAPES)
DRAPE SURG ORHT 6 SPLT 77X108 (DRAPES) IMPLANT
DRSG ADAPTIC 3X8 NADH LF (GAUZE/BANDAGES/DRESSINGS) ×4 IMPLANT
ELECT REM PT RETURN 9FT ADLT (ELECTROSURGICAL) ×4
ELECTRODE REM PT RTRN 9FT ADLT (ELECTROSURGICAL) ×2 IMPLANT
EVACUATOR SILICONE 100CC (DRAIN) IMPLANT
GAUZE SPONGE 4X4 12PLY STRL (GAUZE/BANDAGES/DRESSINGS) ×4 IMPLANT
GAUZE SPONGE 4X4 12PLY STRL LF (GAUZE/BANDAGES/DRESSINGS) IMPLANT
GLOVE BIO SURGEON STRL SZ7.5 (GLOVE) ×4 IMPLANT
GLOVE BIOGEL PI IND STRL 8 (GLOVE) ×2 IMPLANT
GLOVE BIOGEL PI INDICATOR 8 (GLOVE) ×2
GOWN STRL REUS W/ TWL LRG LVL3 (GOWN DISPOSABLE) ×4 IMPLANT
GOWN STRL REUS W/ TWL XL LVL3 (GOWN DISPOSABLE) ×4 IMPLANT
GOWN STRL REUS W/TWL LRG LVL3 (GOWN DISPOSABLE) ×4
GOWN STRL REUS W/TWL XL LVL3 (GOWN DISPOSABLE) ×4
KIT BASIN OR (CUSTOM PROCEDURE TRAY) ×4 IMPLANT
KIT TURNOVER KIT B (KITS) ×4 IMPLANT
NS IRRIG 1000ML POUR BTL (IV SOLUTION) ×4 IMPLANT
PACK GENERAL/GYN (CUSTOM PROCEDURE TRAY) ×4 IMPLANT
PAD ARMBOARD 7.5X6 YLW CONV (MISCELLANEOUS) ×8 IMPLANT
STAPLER VISISTAT 35W (STAPLE) ×4 IMPLANT
STOCKINETTE IMPERVIOUS LG (DRAPES) ×4 IMPLANT
SUT ETHILON 3 0 PS 1 (SUTURE) IMPLANT
SUT SILK 0 TIES 10X30 (SUTURE) ×4 IMPLANT
SUT SILK 2 0 (SUTURE) ×2
SUT SILK 2 0 PERMA HAND 18 BK (SUTURE) ×4 IMPLANT
SUT SILK 2-0 18XBRD TIE 12 (SUTURE) ×2 IMPLANT
SUT SILK 3 0 (SUTURE)
SUT SILK 3 0 SH CR/8 (SUTURE) ×4 IMPLANT
SUT SILK 3 0SH CR/8 30 (SUTURE) IMPLANT
SUT SILK 3-0 18XBRD TIE 12 (SUTURE) IMPLANT
SUT VIC AB 2-0 CT1 18 (SUTURE) ×12 IMPLANT
SUT VIC AB 3-0 SH 18 (SUTURE) IMPLANT
TAPE UMBILICAL COTTON 1/8X30 (MISCELLANEOUS) IMPLANT
TOWEL GREEN STERILE (TOWEL DISPOSABLE) ×8 IMPLANT
TOWEL GREEN STERILE FF (TOWEL DISPOSABLE) ×4 IMPLANT
UNDERPAD 30X30 (UNDERPADS AND DIAPERS) ×4 IMPLANT
WATER STERILE IRR 1000ML POUR (IV SOLUTION) ×4 IMPLANT

## 2018-09-22 NOTE — Op Note (Signed)
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 Marty Heck, MD  Assistant: Leontine Locket, PA  Indications: This is a 78 year old male who was seen in clinic last Tuesday with nonhealing left second third toe amputations.  Ultimately he underwent revascularization with SFA and tibial intervention and then left transmetatarsal amputation at the end of last week.  Ultimately his TMA was left open given that a lot of the tissue was necrotic and he had a plantar abscess.  He returns today for TMA closure versus BKA after risks and benefits were discussed.  Operative findings: Unfortunately, the plantar flap was necrotic and appeared nonviable.  I did not think there was enough soft tissue to shorten his TMA and make another attempt to revise the TMA.  I discussed with him converting to a below-knee amputation if the tissue was not viable prior to surgery and patient was amenable to that plan.  Left below knee amputation tissue all appeared viable.  Anesthesia: General  Details: The patient was taken to the operating room after informed consent was obtained.  He was placed on operative table in supine position and his left leg and foot were prepped and draped in usual sterile fashion after dressings were removed.  Preoperative antibiotics were administered.  Timeout was performed to identify patient, procedure and site.  Initially evaluated the TMA on his left foot and unfortunately most of the plantar flap was necrotic and nonviable.  In further evaluating I did not think there was any room to revise this and still have enough tissue to close the wound.  As a result we elected to proceed with below-knee amputation.  I marked out the tibia one handsbreadth below the tibial tuberosity.  I then used a suture here and marked the  total circumference of the leg and divide this into two thirds one thirds distance.  The two thirds distance was used to mark the anterior posterior diameter of the flap and the one thirds distance was used to mark the length of the flap along the gastrocnemius.  Ultimately once the flap was marked out this incision was made with a 15 blade scalpel.  We then dissected through subcutaneous tissue fascia and muscle with Bovie cautery.  Initially the anterior tibial artery and veins were identified and ligated between 2-0 silk ties and divided.  I then circumferentially mobilized the tibia that was then transected with an oscillating saw.  The fibula was dissected higher and then cut with a hand-held bone cutter.  We then found the peroneal and posterior tibial arteries/veins that were controlled hemostats and ultimately ligated with 2-0 silk ties and divided.  The posterior flap was then created using healthy gastrocnemius and soleus muscle.  The specimen was then passed off the field.  We copiously irrigated the wound until all the effluent was clear.  Hemostasis was then achieved with Bovie cautery and additional sutures.  We then reapproximated the flap and this was secured reapproximating the fascia with 2-0 Vicryl's in interrupted fashion until we could not insert a finger into the fascia.  The skin was then closed with staples.    Complication: None  Condition: Stable  Marty Heck, MD [facility] [place] Office: [phone] Pager: [place]

## 2018-09-22 NOTE — Transfer of Care (Signed)
Immediate Anesthesia Transfer of Care Note  Patient: Thomas Garcia  Procedure(s) Performed: AMPUTATION BELOW KNEE LEFT (Left Leg Lower)  Patient Location: PACU  Anesthesia Type:General  Level of Consciousness: drowsy and patient cooperative  Airway & Oxygen Therapy: Patient Spontanous Breathing  Post-op Assessment: Report given to RN, Post -op Vital signs reviewed and stable and Patient moving all extremities X 4  Post vital signs: Reviewed and stable  Last Vitals:  Vitals Value Taken Time  BP 90/68 09/22/18 1534  Temp    Pulse 84 09/22/18 1537  Resp 22 09/22/18 1537  SpO2 90 % 09/22/18 1537  Vitals shown include unvalidated device data.  Last Pain:  Vitals:   09/22/18 1049  TempSrc:   PainSc: 3          Complications: No apparent anesthesia complications

## 2018-09-22 NOTE — Anesthesia Postprocedure Evaluation (Signed)
Anesthesia Post Note  Patient: Thomas Garcia  Procedure(s) Performed: AMPUTATION BELOW KNEE LEFT (Left Leg Lower)     Patient location during evaluation: PACU Anesthesia Type: General Level of consciousness: awake Pain management: pain level controlled Vital Signs Assessment: post-procedure vital signs reviewed and stable Respiratory status: spontaneous breathing Cardiovascular status: stable Postop Assessment: no apparent nausea or vomiting Anesthetic complications: no    Last Vitals:  Vitals:   09/22/18 1635 09/22/18 1650  BP: (!) 111/56 (!) 137/57  Pulse: 86 87  Resp: 12 13  Temp:    SpO2: 100% 100%    Last Pain:  Vitals:   09/22/18 1645  TempSrc:   PainSc: 9                  Kenijah Benningfield

## 2018-09-22 NOTE — Progress Notes (Signed)
Spoke with Patient son. Updated him and all questions answered. Will monitor patient. Dallin Mccorkel, Bettina Gavia rN

## 2018-09-22 NOTE — Anesthesia Preprocedure Evaluation (Signed)
Anesthesia Evaluation  Patient identified by MRN, date of birth, ID band Patient awake    Reviewed: Allergy & Precautions, NPO status , Patient's Chart, lab work & pertinent test results  History of Anesthesia Complications (+) PONV  Airway Mallampati: II  TM Distance: >3 FB     Dental   Pulmonary Current Smoker,    breath sounds clear to auscultation       Cardiovascular + Peripheral Vascular Disease   Rhythm:Regular Rate:Normal     Neuro/Psych    GI/Hepatic negative GI ROS, Neg liver ROS,   Endo/Other  diabetes  Renal/GU Renal disease     Musculoskeletal   Abdominal   Peds  Hematology   Anesthesia Other Findings   Reproductive/Obstetrics                             Anesthesia Physical Anesthesia Plan  ASA: III  Anesthesia Plan: General   Post-op Pain Management:    Induction: Intravenous  PONV Risk Score and Plan: Ondansetron, Dexamethasone, Midazolam and Treatment may vary due to age or medical condition  Airway Management Planned: Oral ETT  Additional Equipment:   Intra-op Plan:   Post-operative Plan: Extubation in OR  Informed Consent: I have reviewed the patients History and Physical, chart, labs and discussed the procedure including the risks, benefits and alternatives for the proposed anesthesia with the patient or authorized representative who has indicated his/her understanding and acceptance.     Dental advisory given  Plan Discussed with: CRNA and Anesthesiologist  Anesthesia Plan Comments:         Anesthesia Quick Evaluation

## 2018-09-22 NOTE — Anesthesia Procedure Notes (Signed)
Procedure Name: LMA Insertion Date/Time: 09/22/2018 2:23 PM Performed by: Julieta Bellini, CRNA Pre-anesthesia Checklist: Patient identified, Emergency Drugs available, Suction available and Patient being monitored Patient Re-evaluated:Patient Re-evaluated prior to induction Oxygen Delivery Method: Circle system utilized Preoxygenation: Pre-oxygenation with 100% oxygen Induction Type: IV induction LMA: LMA inserted LMA Size: 5.0 Number of attempts: 1 Placement Confirmation: positive ETCO2,  CO2 detector and breath sounds checked- equal and bilateral Tube secured with: Tape Dental Injury: Teeth and Oropharynx as per pre-operative assessment

## 2018-09-22 NOTE — Progress Notes (Signed)
[facility] [place]  Subjective  - No complaints.   Objective (!) 120/57 66 98.1 F (36.7 C) (Oral) 16 97%  Intake/Output Summary (Last 24 hours) at 09/22/2018 1341 Last data filed at 09/22/2018 0508 Gross per 24 hour  Intake 985.74 ml  Output 325 ml  Net 660.74 ml    Left foot dressing c/d/i DP dopple LLE  Laboratory Lab Results: Recent Labs    09/22/18 0322  WBC 14.2*  HGB 8.6*  HCT 26.0*  PLT 486*   BMET Recent Labs    09/22/18 0322  NA 136  K 3.5  CL 104  CO2 25  GLUCOSE 167*  BUN 11  CREATININE 0.86  CALCIUM 8.2*    COAG Lab Results  Component Value Date   INR 1.1 09/08/2018   No results found for: PTT  Assessment/Planning:  Washout and closure of L TMA vs BKA.  Risks and benefits discussed.  Marty Heck 09/22/2018 1:41 PM --

## 2018-09-22 NOTE — Progress Notes (Signed)
Patient arrived from PACU, Left stump wrapped, dressing clean dry and intact. Patient resting at this time. Vital signs obtained and patient placed on monitor. Bart Ashford, Bettina Gavia rN

## 2018-09-23 ENCOUNTER — Encounter (HOSPITAL_COMMUNITY): Payer: Self-pay | Admitting: Vascular Surgery

## 2018-09-23 LAB — CBC
HCT: 24.1 % — ABNORMAL LOW (ref 39.0–52.0)
Hemoglobin: 7.9 g/dL — ABNORMAL LOW (ref 13.0–17.0)
MCH: 29.5 pg (ref 26.0–34.0)
MCHC: 32.8 g/dL (ref 30.0–36.0)
MCV: 89.9 fL (ref 80.0–100.0)
Platelets: 512 10*3/uL — ABNORMAL HIGH (ref 150–400)
RBC: 2.68 MIL/uL — ABNORMAL LOW (ref 4.22–5.81)
RDW: 13.9 % (ref 11.5–15.5)
WBC: 17.4 10*3/uL — ABNORMAL HIGH (ref 4.0–10.5)
nRBC: 0 % (ref 0.0–0.2)

## 2018-09-23 LAB — GLUCOSE, CAPILLARY: Glucose-Capillary: 221 mg/dL — ABNORMAL HIGH (ref 70–99)

## 2018-09-23 LAB — BASIC METABOLIC PANEL
Anion gap: 7 (ref 5–15)
BUN: 11 mg/dL (ref 8–23)
CO2: 27 mmol/L (ref 22–32)
Calcium: 8.2 mg/dL — ABNORMAL LOW (ref 8.9–10.3)
Chloride: 103 mmol/L (ref 98–111)
Creatinine, Ser: 0.83 mg/dL (ref 0.61–1.24)
GFR calc Af Amer: >60 mL/min (ref >60)
GFR calc non Af Amer: >60 mL/min (ref >60)
Glucose, Bld: 211 mg/dL — ABNORMAL HIGH (ref 70–99)
Potassium: 3.8 mmol/L (ref 3.5–5.1)
Sodium: 137 mmol/L (ref 135–145)

## 2018-09-23 LAB — AEROBIC/ANAEROBIC CULTURE W GRAM STAIN (SURGICAL/DEEP WOUND): Culture: NO GROWTH

## 2018-09-23 NOTE — Progress Notes (Signed)
Inpatient Rehab Admissions:  Inpatient Rehab Consult received.  I met with pt at the bedside for rehabilitation assessment and to discuss goals and expectations of an inpatient rehab admission.  Pt wanting to discuss the program with his son (along with cost) prior to making decision regarding CIR.   AC will follow up with pt tomorrow for final decision.   Jhonnie Garner, OTR/L  Rehab Admissions Coordinator  [phone] 09/23/2018 3:53 PM

## 2018-09-23 NOTE — Evaluation (Signed)
Physical Therapy Evaluation Patient Details Name: Thomas Garcia MRN: [id number] DOB: 02-Jan-1941 Today's Date: 09/23/2018   History of Present Illness  Thomas Garcia is a 78 y.o. 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  He reports one year of severe pain in the left foot - mainly the great toe.  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  These have been nonhealing.  Revascularization 6/10.  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    Clinical Impression  Pt admitted with above diagnosis. Pt currently with functional limitations due to the deficits listed below (see PT Problem List). Pt was able to pivot with mod assist but needed cues and assist with flexed posture.  Will need Rehab to prepare pt to achieve Modif Independence at home.   Pt will benefit from skilled PT to increase their independence and safety with mobility to allow discharge to the venue listed below.    Follow Up Recommendations CIR    Equipment Recommendations  Wheelchair (Amputee [phone] lightweight with deskarmrests, anti-tippers, amputee pad for left LE);Wheelchair cushion (18x16 pressure relieving cushion)    Recommendations for Other Services Rehab consult     Precautions / Restrictions Precautions Precautions: Fall Restrictions Weight Bearing Restrictions: Yes LLE Weight Bearing: Non weight bearing LLE Partial Weight Bearing Percentage or Pounds: BKA      Mobility  Bed Mobility Overal bed mobility: Modified Independent             General bed mobility comments: head of bed raised  Transfers Overall transfer level: Needs assistance Equipment used: Rolling walker (2 wheeled) Transfers: Sit to/from [facility] Sit to Stand: Mod assist Stand pivot transfers: Mod assist;Min assist        General transfer comment: increased time, pt needed assist to power up and steadying assist to maintain balance once standing.  Pt was able to pivot around with assist to move RW.  Overall fair stability.  Pt maintained flexed posture in trunk and right LE as well making transfer more difficult.   Ambulation/Gait                Stairs            Wheelchair Mobility    Modified Rankin (Stroke Patients Only)       Balance Overall balance assessment: Needs assistance Sitting-balance support: Feet supported;No upper extremity supported Sitting balance-Leahy Scale: Good     Standing balance support: Bilateral upper extremity supported;During functional activity Standing balance-Leahy Scale: Poor Standing balance comment: relies on UE support and RW for balance                              Pertinent Vitals/Pain Pain Assessment: 0-10 Pain Score: 7  Pain Descriptors / Indicators: Sore;Discomfort Pain Intervention(s): Limited activity within patient's tolerance;Monitored during session;Repositioned    Home Living Family/patient expects to be discharged to:: Private residence Living Arrangements: Alone Available Help at Discharge: Family;Available PRN/intermittently Type of Home: Mobile home Home Access: Stairs to enter Entrance Stairs-Rails: Right;Left;Can reach both Entrance Stairs-Number of Steps: 3 Home Layout: One level Home Equipment: Walker - 2 wheels Additional Comments: has walking stick    Prior Function Level of Independence: Independent;Needs assistance   Gait / Transfers Assistance Needed: household ambulator  ADL's / Homemaking Assistance Needed: assisted by his brother for community ADLs  Comments: States son may build ramp     Hand Dominance        Extremity/Trunk Assessment   Upper Extremity Assessment Upper Extremity Assessment: Defer to OT evaluation    Lower Extremity Assessment Lower Extremity Assessment: Generalized  weakness;LLE deficits/detail LLE Deficits / Details: grossly 3-/5 LLE: Unable to fully assess due to pain    Cervical / Trunk Assessment Cervical / Trunk Assessment: Normal  Communication   Communication: No difficulties  Cognition Arousal/Alertness: Awake/alert Behavior During Therapy: WFL for tasks assessed/performed Overall Cognitive Status: Impaired/Different from baseline Area of Impairment: Problem solving;Awareness;Safety/judgement                         Safety/Judgement: Decreased awareness of safety;Decreased awareness of deficits   Problem Solving: Difficulty sequencing;Requires verbal cues;Requires tactile cues        General Comments      Exercises     Assessment/Plan    PT Assessment Patient needs continued PT services  PT Problem List Decreased strength;Decreased activity tolerance;Decreased balance;Decreased mobility       PT Treatment Interventions Therapeutic exercise;Gait training;Stair training;Functional mobility training;Therapeutic activities;Patient/family education    PT Goals (Current goals can be found in the Care Plan section)  Acute Rehab PT Goals Patient Stated Goal: to get stronger PT Goal Formulation: With patient Time For Goal Achievement: 10/07/18 Potential to Achieve Goals: Good    Frequency Min 5X/week   Barriers to discharge Decreased caregiver support      Co-evaluation               AM-PAC PT "6 Clicks" Mobility  Outcome Measure Help needed turning from your back to your side while in a flat bed without using bedrails?: None Help needed moving from lying on your back to sitting on the side of a flat bed without using bedrails?: None Help needed moving to and from a bed to a chair (including a wheelchair)?: A Lot Help needed standing up from a chair using your arms (e.g., wheelchair or bedside chair)?: A Lot Help needed to walk in hospital room?: Total Help needed climbing 3-5 steps with a railing? :  Total 6 Click Score: 14    End of Session Equipment Utilized During Treatment: Gait belt Activity Tolerance: Patient limited by fatigue Patient left: in chair;with call bell/phone within reach;with chair alarm set Nurse Communication: Mobility status PT Visit Diagnosis: Unsteadiness on feet (R26.81);Other abnormalities of gait and mobility (R26.89);Muscle weakness (generalized) (M62.81)    Time: 3704-8889 PT Time Calculation (min) (ACUTE ONLY): 21 min   Charges:   PT Evaluation $PT Eval Moderate Complexity: [place] Pager:  [phone]  Office:  [phone]    Denice Paradise 09/23/2018, 10:13 AM

## 2018-09-23 NOTE — Progress Notes (Signed)
[facility]  Subjective  - No complaints.   Objective (!) 133/57 71 97.6 F (36.4 C) (Oral) 18 96%  Intake/Output Summary (Last 24 hours) at 09/23/2018 0800 Last data filed at 09/23/2018 0644 Gross per 24 hour  Intake 1278.33 ml  Output 500 ml  Net 778.33 ml    L BKA dressing c/d/i  Laboratory Lab Results: Recent Labs    09/22/18 0322 09/23/18 0359  WBC 14.2* 17.4*  HGB 8.6* 7.9*  HCT 26.0* 24.1*  PLT 486* 512*   BMET Recent Labs    09/22/18 0322 09/23/18 0359  NA 136 137  K 3.5 3.8  CL 104 103  CO2 25 27  GLUCOSE 167* 211*  BUN 11 11  CREATININE 0.86 0.83  CALCIUM 8.2* 8.2*    COAG Lab Results  Component Value Date   INR 1.1 09/08/2018   No results found for: PTT  Assessment/Planning: POD #1 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  Plantar flap necrotic in OR yesterday.  Will remove dressing tomorrow.  Continue vanc/zosyn for 48 hours.  PT/OT etc.  Pain well controlled today.  Marty Heck 09/23/2018 8:00 AM --

## 2018-09-23 NOTE — Evaluation (Signed)
Occupational Therapy Evaluation Patient Details Name: Thomas Garcia MRN: [id number] DOB: [birth year] Today's Date: 09/23/2018    History of Present Illness Thomas Garcia is a 78 y.o. 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  He reports one year of severe pain in the left foot - mainly the great toe.  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  These have been nonhealing.  Revascularization 6/10.  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     Clinical Impression   Pt in recliner upon therapy arrival and agreeable to participate in OT evaluation. Patient is overall functioning fairly well post Left BKA although requires additional assistance with transfers, sit to stands, and balance. PT is recommending a short stay at CIR to increase independence with ADL tasks and functional transfers and allow him to return home alone at Mod I or independent level. Pt will benefit from skilled OT services since acute stay to focus on mentioned deficits.     Follow Up Recommendations  CIR    Equipment Recommendations  3 in 1 bedside commode;Tub/shower bench       Precautions / Restrictions Precautions Precautions: Fall Precaution Comments: recent left BKA Restrictions Weight Bearing Restrictions: Yes LLE Weight Bearing: Non weight bearing LLE Partial Weight Bearing Percentage or Pounds: BKA      Mobility Bed Mobility Overal bed mobility: Modified Independent             General bed mobility comments: head of bed raised  Transfers Overall transfer level: Needs assistance Equipment used: Rolling walker (2 wheeled) Transfers: Sit to/from Stand Sit to Stand: Mod assist Stand pivot transfers: Mod assist;Min assist       General transfer comment: increased time, pt needed assist to power up and  steadying assist to maintain balance once standing.  Pt was able to pivot around with assist to move RW.  Overall fair stability.  Pt maintained flexed posture in trunk and right LE as well making transfer more difficult.     Balance Overall balance assessment: Needs assistance Sitting-balance support: Feet supported;No upper extremity supported Sitting balance-Leahy Scale: Good     Standing balance support: Bilateral upper extremity supported;During functional activity Standing balance-Leahy Scale: Poor Standing balance comment: relies on UE support and RW for balance         ADL either performed or assessed with clinical judgement   ADL Overall ADL's : Needs assistance/impaired Eating/Feeding: Modified independent;Sitting   Grooming: Set up;Wash/dry hands;Wash/dry face;Sitting   Upper Body Bathing: Set up;Sitting   Lower Body Bathing: Minimal assistance;Bed level   Upper Body Dressing : Set up;Sitting   Lower Body Dressing: Minimal assistance;Bed level   Toilet Transfer: Moderate assistance;Stand-pivot;RW          Vision Baseline Vision/History: No visual deficits Patient Visual Report: No change from baseline              Pertinent Vitals/Pain Pain Assessment: No/denies pain Pain Score: 7  Pain Descriptors / Indicators: Sore;Discomfort Pain Intervention(s): Limited activity within patient's tolerance;Monitored during session;Repositioned     Hand Dominance Right   Extremity/Trunk Assessment Upper Extremity Assessment Upper Extremity Assessment: Overall WFL for tasks assessed   Lower Extremity Assessment Lower Extremity Assessment: Defer to PT evaluation LLE Deficits / Details: grossly 3-/5 LLE: Unable to fully assess due to pain  Cervical / Trunk Assessment Cervical / Trunk Assessment: Normal   Communication Communication Communication: No difficulties   Cognition Arousal/Alertness: Awake/alert Behavior During Therapy: WFL for tasks  assessed/performed Overall Cognitive Status: Within Functional Limits for tasks assessed Area of Impairment: Problem solving;Awareness;Safety/judgement       Safety/Judgement: Decreased awareness of safety;Decreased awareness of deficits   Problem Solving: Difficulty sequencing;Requires verbal cues;Requires tactile cues                Home Living Family/patient expects to be discharged to:: Private residence Living Arrangements: Alone Available Help at Discharge: Family;Available PRN/intermittently(Brother lives next door) Type of Home: Mobile home Home Access: Stairs to enter [employer] of Steps: 3 Entrance Stairs-Rails: Right;Left;Can reach both Home Layout: One level     Bathroom Shower/Tub: Teacher, early years/pre: Standard Bathroom Accessibility: Yes How Accessible: Accessible via walker Home Equipment: [place] - 2 wheels   Additional Comments: has walking stick      Prior Functioning/Environment Level of Independence: Independent;Needs assistance  Gait / Transfers Assistance Needed: household ambulator ADL's / Homemaking Assistance Needed: assisted by his brother for community ADLs   Comments: Thinks his son will build him a ramp        OT Problem List: Decreased strength;Impaired balance (sitting and/or standing);Decreased knowledge of use of DME or AE      OT Treatment/Interventions: Self-care/ADL training;Balance training;Therapeutic exercise;Therapeutic activities;DME and/or AE instruction;Patient/family education    OT Goals(Current goals can be found in the care plan section) Acute Rehab OT Goals Patient Stated Goal: To go home OT Goal Formulation: With patient Time For Goal Achievement: 10/07/18 Potential to Achieve Goals: Good  OT Frequency: Min 2X/week   Barriers to D/C:    Pt lives alone. Family is available to assist PRN.          AM-PAC OT "6 Clicks" Daily Activity     Outcome Measure Help from another person  eating meals?: None Help from another person taking care of personal grooming?: None Help from another person toileting, which includes using toliet, bedpan, or urinal?: A Little Help from another person bathing (including washing, rinsing, drying)?: A Little Help from another person to put on and taking off regular upper body clothing?: A Little Help from another person to put on and taking off regular lower body clothing?: A Little 6 Click Score: 20   End of Session    Activity Tolerance: Patient tolerated treatment well Patient left: in bed;with call bell/phone within reach;with chair alarm set  OT Visit Diagnosis: Muscle weakness (generalized) (M62.81)                Time: 1100-1118 OT Time Calculation (min): 18 min Charges:  OT General Charges $OT Visit: 1 Visit OT Evaluation $OT Eval Low Complexity: 1 Low OT Treatments $Self Care/Home Management : 8-22 mins(11')  Ailene Ravel, OTR/L,CBIS  [phone]   [place], Clarene Duke 09/23/2018, 11:24 AM

## 2018-09-23 NOTE — Consult Note (Signed)
PV Navigator consult acknowledged. Chart reviewed. Working remotely at this time.  78 y/o male admitted for non healing wounds left foot 2nd and 3rd toes > 9mo with MRI confirming + osteo. Surgical amputation of 3rd toe and partial 2nd toe 09/10/18. Revascularization 09/17/18. Wounds continued to decline and pt elected L TMA with Dr Carlis Abbott on 09/18/18. Patient continued to have limb pain and pt opted for a L BKA 09/22/18 due to flap necrosis and noted non viable tissue, in hopes that this would optimize healing potential and minimize pain.   Medical history to include PAD, PVD, DM. Active smoker. Noted Nutritional, Diabetes Coordinator and IP rehab consults completed.  Will continue to follow this patient. Thank you for this consult.  Cletis Media RN BSN CWS [place] [phone]

## 2018-09-23 NOTE — Care Management Important Message (Signed)
Important Message  Patient Details  Name: RIDGELY ANASTACIO MRN: [id number] Date of Birth: 07-07-1940   Medicare Important Message Given:  Yes    Shelda Altes 09/23/2018, 12:55 PM

## 2018-09-23 NOTE — PMR Pre-admission (Signed)
PMR Admission Coordinator Pre-Admission Assessment  Patient: Thomas Garcia is an 78 y.o., male MRN: [id number] DOB: 03/20/1941 Height: 5' 9.02" (175.3 cm) Weight: 83.9 kg  Insurance Information HMO:     PPO:      PCP:      IPA:      80/20: yes     OTHER:  PRIMARY: Medicare A and B      Policy#: [id number]      Subscriber: Patient CM Name:       Phone#:      Fax#:  Pre-Cert#:       Employer:  Benefits:  Phone #: NA     Name: verified eligibility via [employer] on 09/23/18 Eff. Date: part A 08/08/07; part B 08/08/07     Deduct: $1,408      Out of Pocket Max: NA      Life Max: NA CIR: Covered per Medicare guidelines once yearly deductible is met      SNF: days 1-20, 100%, days 21-100, 80% Outpatient: 80%     Co-Pay: 20% Home Health: 100%      Co-Pay:  DME: 80%     Co-Pay: 20% Providers: Pt's choice SECONDARY:      Policy#:       Subscriber:  CM Name:       Phone#:      Fax#:  Pre-Cert#:       Employer:  Benefits:  Phone #:      Name:  Eff. Date:      Deduct:       Out of Pocket Max:       Life Max:  CIR:       SNF:  Outpatient:      Co-Pay:  Home Health:       Co-Pay:  DME:      Co-Pay:   Medicaid Application Date:       Case Manager:  Disability Application Date:       Case Worker:   The "Data Collection Information Summary" for patients in Inpatient Rehabilitation Facilities with attached "Privacy Act Statement-Health Care Records" was provided and verbally reviewed with: Patient  Emergency Contact Information Contact Information    Name Relation Home Work Mobile   Groot,michael Son [phone]        Current Medical History  Patient Admitting Diagnosis: L BKA History of Present Illness: Thomas Garcia is a 78-year-old male with history of diabetes mellitus, tobacco abuse, peripheral vascular disease. Presented 09/17/2018 with ischemic left lower extremity and recent amputation of left third toe partial amputation of left second toe 09/10/2018 and was discharged to home  09/11/2018 with home health therapies.  Aortogram completed of left lower extremity showing aortoiliac segment very calcified and a very large posterior plaque in the distal external iliac common femoral artery.  The SFA was diffusely diseased and several segments of chronic total occlusion.  No change with conservative care and underwent left transmetatarsal amputation wound VAC placement 09/18/2018 per Dr. 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  Patient on vancomycin x48 hours postoperatively.  Hospital course pain management.  Acute blood loss anemia 7.3.  Subcutaneous heparin for DVT prophylaxis.  Therapy evaluations completed with recommendations for CIR. Patient is to be admitted for a comprehensive rehab program on 09/24/18.    Patient's medical record from [facility] has been reviewed by the rehabilitation admission coordinator and physician.  Past Medical History  Past Medical History:  Diagnosis Date  .   Diabetes mellitus without complication (HCC)   . Kidney stones   . PONV (postoperative nausea and vomiting)     Family History   family history is not on file.  Prior Rehab/Hospitalizations Has the patient had prior rehab or hospitalizations prior to admission? No  Has the patient had major surgery during 100 days prior to admission? Yes   Current Medications  Current Facility-Administered Medications:  .  0.9 %  sodium chloride infusion, , Intravenous, Continuous, Rhyne, Samantha J, PA-C, Stopped at 09/22/18 1817 .  acetaminophen (TYLENOL) tablet 650 mg, 650 mg, Oral, Q4H PRN, Rhyne, Samantha J, PA-C .  alum & mag hydroxide-simeth (MAALOX/MYLANTA) 200-200-20 MG/5ML suspension 15-30 mL, 15-30 mL, Oral, Q2H PRN, Rhyne, Samantha J, PA-C .  aspirin EC tablet 81 mg, 81 mg, Oral, Daily, Rhyne, Samantha J, PA-C, 81 mg at 09/24/18 0821 .  atorvastatin (LIPITOR) tablet 10 mg, 10 mg, Oral, q1800, Rhyne, Samantha J, PA-C, 10 mg at 09/23/18  1800 .  clopidogrel (PLAVIX) tablet 75 mg, 75 mg, Oral, Daily, Rhyne, Samantha J, PA-C, 75 mg at 09/24/18 0820 .  docusate sodium (COLACE) capsule 100 mg, 100 mg, Oral, Daily, Rhyne, Samantha J, PA-C .  gabapentin (NEURONTIN) capsule 100 mg, 100 mg, Oral, TID, Rhyne, Samantha J, PA-C, 100 mg at 09/24/18 0821 .  guaiFENesin-dextromethorphan (ROBITUSSIN DM) 100-10 MG/5ML syrup 15 mL, 15 mL, Oral, Q4H PRN, Rhyne, Samantha J, PA-C .  heparin injection 5,000 Units, 5,000 Units, Subcutaneous, Q8H, Rhyne, Samantha J, PA-C, 5,000 Units at 09/24/18 0524 .  hydrALAZINE (APRESOLINE) injection 5 mg, 5 mg, Intravenous, Q20 Min PRN, Rhyne, Samantha J, PA-C .  labetalol (NORMODYNE) injection 10 mg, 10 mg, Intravenous, Q10 min PRN, Rhyne, Samantha J, PA-C .  magnesium sulfate IVPB 2 g 50 mL, 2 g, Intravenous, Daily PRN, Rhyne, Samantha J, PA-C .  metoprolol tartrate (LOPRESSOR) injection 2-5 mg, 2-5 mg, Intravenous, Q2H PRN, Rhyne, Samantha J, PA-C .  morphine 2 MG/ML injection 2 mg, 2 mg, Intravenous, Q2H PRN, Rhyne, Samantha J, PA-C, 2 mg at 09/22/18 1818 .  ondansetron (ZOFRAN) injection 4 mg, 4 mg, Intravenous, Q6H PRN, Rhyne, Samantha J, PA-C .  oxyCODONE-acetaminophen (PERCOCET/ROXICET) 5-325 MG per tablet 1-2 tablet, 1-2 tablet, Oral, Q4H PRN, Rhyne, Samantha J, PA-C, 2 tablet at 09/24/18 1235 .  pantoprazole (PROTONIX) EC tablet 40 mg, 40 mg, Oral, Daily, Rhyne, Samantha J, PA-C, 40 mg at 09/24/18 0820 .  phenol (CHLORASEPTIC) mouth spray 1 spray, 1 spray, Mouth/Throat, PRN, Rhyne, Samantha J, PA-C .  piperacillin-tazobactam (ZOSYN) IVPB 3.375 g, 3.375 g, Intravenous, Q8H, Clark, Christopher J, MD, Last Rate: 12.5 mL/hr at 09/24/18 0644, 3.375 g at 09/24/18 0644 .  potassium chloride SA (K-DUR) CR tablet 20-40 mEq, 20-40 mEq, Oral, Daily PRN, Rhyne, Samantha J, PA-C .  sodium chloride flush (NS) 0.9 % injection 10-40 mL, 10-40 mL, Intracatheter, PRN, Rhyne, Samantha J, PA-C .  vancomycin (VANCOCIN) IVPB  1000 mg/200 mL premix, 1,000 mg, Intravenous, Q12H, Clark, Christopher J, MD, Last Rate: 200 mL/hr at 09/24/18 0525, 1,000 mg at 09/24/18 0525  Patients Current Diet:  Diet Order            Diet Carb Modified Fluid consistency: Thin; Room service appropriate? Yes with Assist  Diet effective now              Precautions / Restrictions Precautions Precautions: Fall Precaution Comments: recent left BKA Restrictions Weight Bearing Restrictions: Yes LLE Weight Bearing: Non weight bearing LLE Partial Weight Bearing Percentage or Pounds: (  BKA)   Has the patient had 2 or more falls or a fall with injury in the past year? No  Prior Activity Level Community (5-7x/wk): retired Research officer, trade union main; drove PTA; did walk dogs approx 3-4x/week. Indepdendent without AD PTA  Prior Functional Level Self Care: Did the patient need help bathing, dressing, using the toilet or eating? Independent  Indoor Mobility: Did the patient need assistance with walking from room to room (with or without device)? Independent  Stairs: Did the patient need assistance with internal or external stairs (with or without device)? Independent  Functional Cognition: Did the patient need help planning regular tasks such as shopping or remembering to take medications? [place] Devices/Equipment: Gilford Rile (specify type) Home Equipment: Walker - 2 wheels  Prior Device Use: Indicate devices/aids used by the patient prior to current illness, exacerbation or injury? None of the above  Current Functional Level Cognition  Overall Cognitive Status: Within Functional Limits for tasks assessed Orientation Level: Oriented X4 Safety/Judgement: Decreased awareness of safety, Decreased awareness of deficits    Extremity Assessment (includes Sensation/Coordination)  Upper Extremity Assessment: Overall WFL for tasks assessed  Lower Extremity Assessment: Defer to PT evaluation LLE  Deficits / Details: grossly 3-/5 LLE: Unable to fully assess due to pain    ADLs  Overall ADL's : Needs assistance/impaired Eating/Feeding: Modified independent, Sitting Grooming: Set up, Wash/dry hands, Wash/dry face, Sitting Upper Body Bathing: Set up, Sitting Lower Body Bathing: Minimal assistance, Bed level Upper Body Dressing : Set up, Sitting Lower Body Dressing: Minimal assistance, Bed level Toilet Transfer: Moderate assistance, Stand-pivot, RW    Mobility  Overal bed mobility: Modified Independent General bed mobility comments: head of bed raised    Transfers  Overall transfer level: Needs assistance Equipment used: Rolling walker (2 wheeled) Transfers: Sit to/from Stand Sit to Stand: Mod assist Stand pivot transfers: Mod assist, Min assist General transfer comment: increased time, pt needed assist to power up and steadying assist to maintain balance once standing.  Pt was able to pivot around with assist to move RW.  Overall fair stability.  Pt maintained flexed posture in trunk and right LE as well making transfer more difficult.     Ambulation / Gait / Stairs / Office manager / Balance Balance Overall balance assessment: Needs assistance Sitting-balance support: Feet supported, No upper extremity supported Sitting balance-Leahy Scale: Good Standing balance support: Bilateral upper extremity supported, During functional activity Standing balance-Leahy Scale: Poor Standing balance comment: relies on UE support and RW for balance     Special needs/care consideration BiPAP/CPAP : no CPM : no Continuous Drip IV : piperacillin-taxobactam, vancomycin Dialysis : no        Days : no Life Vest : no Oxygen : no Special Bed : no Trach Size : no Wound Vac (area) : no      Location : no Skin : rash to right and left posterior thigh; surgical incision to LLE (BKA)                    Bowel mgmt: some incontinence in hospital; last BM: 09/23/18 Bladder mgmt:  some incontinence at baseline per pt Diabetic mgmt: yes Behavioral consideration : no Chemo/radiation : no   Previous Home Environment (from acute therapy documentation) Living Arrangements: Alone Available Help at Discharge: Family, Available PRN/intermittently(Brother lives next door) Type of Home: Mobile home Home Layout: One level Home Access: Stairs to enter Entrance  Stairs-Rails: Right, Left, Can reach both Entrance Stairs-Number of Steps: 3 Bathroom Shower/Tub: Chiropodist: Standard Bathroom Accessibility: Yes How Accessible: Accessible via walker Home Care Services: No Additional Comments: has walking stick  Discharge Living Setting Plans for Discharge Living Setting: Patient's home, Lives with (comment), Mobile Home(has brother next door on property) Type of Home at Discharge: Mobile home Discharge Home Layout: One level Discharge Home Access: Stairs to enter Entrance Stairs-Rails: Right, Left Entrance Stairs-Number of Steps: 3 Discharge Bathroom Shower/Tub: Tub/shower unit Discharge Bathroom Toilet: Standard Discharge Bathroom Accessibility: No(not for RW per son) Does the patient have any problems obtaining your medications?: No  Social/Family/Support Systems Patient Roles: Other (Comment)(brother lives on property; son is 8 miles away) Contact Information: son: [phone] Legrand [place]); brother [name]): [phone] Anticipated Caregiver: son and brother Anticipated Caregiver's Contact Information: see above Ability/Limitations of Caregiver: intermittant A Caregiver Availability: Intermittent Discharge Plan Discussed with Primary Caregiver: Yes(son and brother) Is Caregiver In Agreement with Plan?: Yes Does Caregiver/Family have Issues with Lodging/Transportation while Pt is in Rehab?: No  Goals/Additional Needs Patient/Family Goal for Rehab: PT/OT: Mod I/Supervision; SLP: NA Expected length of stay: 7-10 days Cultural Considerations:  NA Dietary Needs: carb modified (cal level 1600-2000); thin liquids; room service with assist.  Equipment Needs: TBD Special Service Needs: amputation  Pt/Family Agrees to Admission and willing to participate: Yes Program Orientation Provided & Reviewed with Pt/Caregiver Including Roles  & Responsibilities: Yes(son and brother (and pt))  Barriers to Discharge: Home environment access/layout  Barriers to Discharge Comments: steps to enter; small bathroom not conduvice to RW; pt open to [facility] in living room; discussed ramp needs with pt and son  Decrease burden of Care through IP rehab admission: NA  Possible need for SNF placement upon discharge: Not anticipated; pt has good prognosis for further progress through CIR. Pt has good social support from family who are willing and able to physically assist as needed at DC.   Patient Condition: I have reviewed medical records from [facility], spoken with CM, RN, and MD, and patient and son and brother. I met with patient at the bedside for inpatient rehabilitation assessment.  Patient will benefit from ongoing PT and OT, can actively participate in 3 hours of therapy a day 5 days of the week, and can make measurable gains during the admission.  Patient will also benefit from the coordinated team approach during an Inpatient Acute Rehabilitation admission.  The patient will receive intensive therapy as well as Rehabilitation physician, nursing, social worker, and care management interventions.  Due to bladder management, bowel management, safety, skin/wound care, disease management, medication administration, pain management and patient education the patient requires 24 hour a day rehabilitation nursing.  The patient is currently Mod A with transfers and overall Min A for LB basic ADLs.  Discharge setting and therapy post discharge at home with home health is anticipated.  Patient has agreed to participate in the Acute Inpatient Rehabilitation  Program and will admit today.  Preadmission Screen Completed By:  Jhonnie Garner, 09/24/2018 12:57 PM ______________________________________________________________________   Discussed status with Dr. Posey Pronto  on 09/24/18 at 12:57PM and received approval for admission today.  Admission Coordinator:  Jhonnie Garner, OT, time 12:57PM/Date 09/24/18   Assessment/Plan: Diagnosis:  L BKA  1. Does the need for close, 24 hr/day Medical supervision in concert with the patient's rehab needs make it unreasonable for this patient to be served in a less intensive setting? Yes  2. Co-Morbidities requiring supervision/potential complications: diabetes  mellitus, tobacco abuse, peripheral vascular disease 3. Due to bowel management, safety, skin/wound care, disease management, pain management and patient education, does the patient require 24 hr/day rehab nursing? Yes 4. Does the patient require coordinated care of a physician, rehab nurse, PT (1-2 hrs/day, 5 days/week) and OT (1-2 hrs/day, 5 days/week) to address physical and functional deficits in the context of the above medical diagnosis(es)? Yes Addressing deficits in the following areas: balance, endurance, locomotion, strength, transferring, bowel/bladder control, bathing, dressing, toileting and psychosocial support 5. Can the patient actively participate in an intensive therapy program of at least 3 hrs of therapy 5 days a week? Yes 6. The potential for patient to make measurable gains while on inpatient rehab is excellent 7. Anticipated functional outcomes upon discharge from inpatients are: supervision PT, supervision OT, n/a SLP 8. Estimated rehab length of stay to reach the above functional goals is: 8-12 days. 9. Anticipated D/C setting: Home 10. Anticipated post D/C treatments: HH therapy and Home excercise program 11. Overall Rehab/Functional Prognosis: good  MD Signature: Ankit Patel, MD, ABPMR 

## 2018-09-24 ENCOUNTER — Encounter (HOSPITAL_COMMUNITY): Payer: Self-pay | Admitting: Nurse Practitioner

## 2018-09-24 ENCOUNTER — Other Ambulatory Visit: Payer: Self-pay

## 2018-09-24 ENCOUNTER — Inpatient Hospital Stay (HOSPITAL_COMMUNITY)
Admission: RE | Admit: 2018-09-24 | Discharge: 2018-09-30 | DRG: 560 | Disposition: A | Discharge status: Home Health | Payer: Medicare Other | Source: Intra-hospital | Attending: Physical Medicine & Rehabilitation | Admitting: Physical Medicine & Rehabilitation

## 2018-09-24 DIAGNOSIS — Z4781 Encounter for orthopedic aftercare following surgical amputation: Principal | ICD-10-CM

## 2018-09-24 DIAGNOSIS — I10 Essential (primary) hypertension: Secondary | ICD-10-CM

## 2018-09-24 DIAGNOSIS — E1142 Type 2 diabetes mellitus with diabetic polyneuropathy: Secondary | ICD-10-CM

## 2018-09-24 DIAGNOSIS — M792 Neuralgia and neuritis, unspecified: Secondary | ICD-10-CM | POA: Diagnosis not present

## 2018-09-24 DIAGNOSIS — G8918 Other acute postprocedural pain: Secondary | ICD-10-CM

## 2018-09-24 DIAGNOSIS — E1151 Type 2 diabetes mellitus with diabetic peripheral angiopathy without gangrene: Secondary | ICD-10-CM | POA: Diagnosis present

## 2018-09-24 DIAGNOSIS — S88112A Complete traumatic amputation at level between knee and ankle, left lower leg, initial encounter: Secondary | ICD-10-CM | POA: Diagnosis present

## 2018-09-24 DIAGNOSIS — Z9081 Acquired absence of spleen: Secondary | ICD-10-CM | POA: Diagnosis not present

## 2018-09-24 DIAGNOSIS — F17210 Nicotine dependence, cigarettes, uncomplicated: Secondary | ICD-10-CM | POA: Diagnosis present

## 2018-09-24 DIAGNOSIS — D62 Acute posthemorrhagic anemia: Secondary | ICD-10-CM | POA: Diagnosis present

## 2018-09-24 DIAGNOSIS — S88112S Complete traumatic amputation at level between knee and ankle, left lower leg, sequela: Secondary | ICD-10-CM

## 2018-09-24 DIAGNOSIS — Z720 Tobacco use: Secondary | ICD-10-CM

## 2018-09-24 DIAGNOSIS — E8809 Other disorders of plasma-protein metabolism, not elsewhere classified: Secondary | ICD-10-CM | POA: Diagnosis present

## 2018-09-24 DIAGNOSIS — E119 Type 2 diabetes mellitus without complications: Secondary | ICD-10-CM | POA: Diagnosis not present

## 2018-09-24 DIAGNOSIS — I739 Peripheral vascular disease, unspecified: Secondary | ICD-10-CM

## 2018-09-24 DIAGNOSIS — G546 Phantom limb syndrome with pain: Secondary | ICD-10-CM | POA: Diagnosis present

## 2018-09-24 DIAGNOSIS — E785 Hyperlipidemia, unspecified: Secondary | ICD-10-CM | POA: Diagnosis present

## 2018-09-24 DIAGNOSIS — Z89512 Acquired absence of left leg below knee: Secondary | ICD-10-CM | POA: Diagnosis not present

## 2018-09-24 DIAGNOSIS — Z7984 Long term (current) use of oral hypoglycemic drugs: Secondary | ICD-10-CM

## 2018-09-24 LAB — BASIC METABOLIC PANEL
Anion gap: 8 (ref 5–15)
BUN: 12 mg/dL (ref 8–23)
CO2: 24 mmol/L (ref 22–32)
Calcium: 8.2 mg/dL — ABNORMAL LOW (ref 8.9–10.3)
Chloride: 105 mmol/L (ref 98–111)
Creatinine, Ser: 0.9 mg/dL (ref 0.61–1.24)
GFR calc Af Amer: >60 mL/min (ref >60)
GFR calc non Af Amer: >60 mL/min (ref >60)
Glucose, Bld: 183 mg/dL — ABNORMAL HIGH (ref 70–99)
Potassium: 3.6 mmol/L (ref 3.5–5.1)
Sodium: 137 mmol/L (ref 135–145)

## 2018-09-24 LAB — GLUCOSE, CAPILLARY
Glucose-Capillary: 187 mg/dL — ABNORMAL HIGH (ref 70–99)
Glucose-Capillary: 208 mg/dL — ABNORMAL HIGH (ref 70–99)
Glucose-Capillary: 137 mg/dL — ABNORMAL HIGH (ref 70–99)

## 2018-09-24 LAB — CBC
HCT: 22.5 % — ABNORMAL LOW (ref 39.0–52.0)
Hemoglobin: 7.3 g/dL — ABNORMAL LOW (ref 13.0–17.0)
MCH: 29.3 pg (ref 26.0–34.0)
MCHC: 32.4 g/dL (ref 30.0–36.0)
MCV: 90.4 fL (ref 80.0–100.0)
Platelets: 522 10*3/uL — ABNORMAL HIGH (ref 150–400)
RBC: 2.49 MIL/uL — ABNORMAL LOW (ref 4.22–5.81)
RDW: 14.4 % (ref 11.5–15.5)
WBC: 11.8 10*3/uL — ABNORMAL HIGH (ref 4.0–10.5)
nRBC: 0 % (ref 0.0–0.2)

## 2018-09-24 MED ORDER — METFORMIN HCL 500 MG PO TABS
500.0000 mg | ORAL_TABLET | Freq: Two times a day (BID) | ORAL | Status: DC
  Administered 2018-09-24 – 2018-09-29 (×10): 500 mg via ORAL
  Filled 2018-09-24 (×10): qty 1

## 2018-09-24 MED ORDER — ACETAMINOPHEN 325 MG PO TABS: 650.0000 mg | ORAL_TABLET | ORAL | Status: DC | PRN

## 2018-09-24 MED ORDER — PANTOPRAZOLE SODIUM 40 MG PO TBEC
40.0000 mg | DELAYED_RELEASE_TABLET | Freq: Every day | ORAL | Status: DC
  Administered 2018-09-25 – 2018-09-30 (×6): 40 mg via ORAL
  Filled 2018-09-24 (×6): qty 1

## 2018-09-24 MED ORDER — CLOPIDOGREL BISULFATE 75 MG PO TABS
75.0000 mg | ORAL_TABLET | Freq: Every day | ORAL | Status: DC
  Administered 2018-09-25 – 2018-09-30 (×6): 75 mg via ORAL
  Filled 2018-09-24 (×6): qty 1

## 2018-09-24 MED ORDER — SORBITOL 70 % SOLN: 30.0000 mL | Freq: Every day | Status: DC | PRN

## 2018-09-24 MED ORDER — ASPIRIN 81 MG PO TBEC: 81.0000 mg | DELAYED_RELEASE_TABLET | Freq: Every day | ORAL | Status: DC

## 2018-09-24 MED ORDER — OXYCODONE-ACETAMINOPHEN 5-325 MG PO TABS
1.0000 | ORAL_TABLET | ORAL | Status: DC | PRN
  Administered 2018-09-24 – 2018-09-30 (×17): 2 via ORAL
  Filled 2018-09-24 (×17): qty 2

## 2018-09-24 MED ORDER — ATORVASTATIN CALCIUM 10 MG PO TABS
10.0000 mg | ORAL_TABLET | Freq: Every day | ORAL | Status: DC
  Administered 2018-09-24 – 2018-09-29 (×6): 10 mg via ORAL
  Filled 2018-09-24 (×6): qty 1

## 2018-09-24 MED ORDER — HEPARIN SODIUM (PORCINE) 5000 UNIT/ML IJ SOLN
5000.0000 [IU] | Freq: Three times a day (TID) | INTRAMUSCULAR | Status: DC
  Administered 2018-09-24 – 2018-09-30 (×17): 5000 [IU] via SUBCUTANEOUS
  Filled 2018-09-24 (×17): qty 1

## 2018-09-24 MED ORDER — HEPARIN SODIUM (PORCINE) 5000 UNIT/ML IJ SOLN: 5000.0000 [IU] | Freq: Three times a day (TID) | INTRAMUSCULAR | Status: DC

## 2018-09-24 MED ORDER — ATORVASTATIN CALCIUM 10 MG PO TABS: 10.0000 mg | ORAL_TABLET | Freq: Every day | ORAL | Status: DC

## 2018-09-24 MED ORDER — CLOPIDOGREL BISULFATE 75 MG PO TABS: 75.0000 mg | ORAL_TABLET | Freq: Every day | ORAL | Status: DC

## 2018-09-24 MED ORDER — ASPIRIN EC 81 MG PO TBEC
81.0000 mg | DELAYED_RELEASE_TABLET | Freq: Every day | ORAL | Status: DC
  Administered 2018-09-25 – 2018-09-30 (×6): 81 mg via ORAL
  Filled 2018-09-24 (×6): qty 1

## 2018-09-24 MED ORDER — INSULIN ASPART 100 UNIT/ML ~~LOC~~ SOLN
0.0000 [IU] | Freq: Three times a day (TID) | SUBCUTANEOUS | Status: DC
  Administered 2018-09-24: 5 [IU] via SUBCUTANEOUS
  Administered 2018-09-25: 09:00:00 2 [IU] via SUBCUTANEOUS
  Administered 2018-09-25 – 2018-09-26 (×3): 3 [IU] via SUBCUTANEOUS
  Administered 2018-09-26 – 2018-09-27 (×2): 2 [IU] via SUBCUTANEOUS
  Administered 2018-09-27: 3 [IU] via SUBCUTANEOUS
  Administered 2018-09-27: 1 [IU] via SUBCUTANEOUS
  Administered 2018-09-28 (×3): 3 [IU] via SUBCUTANEOUS
  Administered 2018-09-29 (×2): 2 [IU] via SUBCUTANEOUS
  Administered 2018-09-29 – 2018-09-30 (×2): 3 [IU] via SUBCUTANEOUS

## 2018-09-24 MED ORDER — GABAPENTIN 100 MG PO CAPS
100.0000 mg | ORAL_CAPSULE | Freq: Three times a day (TID) | ORAL | Status: DC
  Administered 2018-09-24 – 2018-09-25 (×3): 100 mg via ORAL
  Filled 2018-09-24 (×3): qty 1

## 2018-09-24 NOTE — Progress Notes (Addendum)
Inpatient Rehabilitation-Admissions Coordinator   Pt and son are agreeable to CIR. AC awaits medical clearance from vascular surgery for possible DC to CIR today. Will follow up.   Jhonnie Garner, OTR/L  Rehab Admissions Coordinator  [phone] 09/24/2018 12:34 PM  Addendum 12:58PM: AC has received medical clearance from Dr. Carlis Abbott for this pt to DC to CIR today. AC will update RN, CM/SW regarding plan.

## 2018-09-24 NOTE — Progress Notes (Signed)
Patient admitted to 4M11 via bed, escorted by nursing staff.  Patient verbalized understanding of rehab process including fall prevention policy, personal belongings policy, and visitation policy.  Patients stump wrapping with bright red blood noted on both medial and lateral aspects of stump.  Patient denies any trauma since surgery.  Redressed stump with xeroform gauze, abd pad, kerlix, and ace wrap.  Encouraged the use of ice and compression to control bleeding. IV antibiotics currently infusing via right midline.  Patient appears to be in no immediate distress at this time.  Brita Romp, RN

## 2018-09-24 NOTE — IPOC Note (Signed)
Individualized overall Plan of Care [facility]) Patient Details Name: Thomas Garcia MRN: [id number] DOB: 1940-12-25  Admitting Diagnosis: Left BKA  Hospital Problems: Active Problems:   Unilateral complete BKA, left, initial encounter ([place])   Unilateral complete BKA, left, sequela (HCC)   Acute blood loss anemia   Essential hypertension   Neuropathic pain     Functional Problem List: Nursing Bowel, Edema, Endurance, Medication Management, Motor, Pain, Safety, Sensory, Skin Integrity  PT Balance, Edema, Endurance, Motor, Sensory, Pain, Skin Integrity  OT Balance, Endurance, Pain, Sensory  SLP    TR         Basic ADL's: OT Bathing, Dressing, Toileting     Advanced  ADL's: OT Simple Meal Preparation     Transfers: PT Bed Mobility, Bed to Chair, Car  OT Toilet, Tub/Shower     Locomotion: PT Ambulation, Emergency planning/management officer, Stairs     Additional Impairments: OT None  SLP        TR      Anticipated Outcomes Item Anticipated Outcome  Self Feeding no goal, pt is Independent  Swallowing      Basic self-care  Mod I  Toileting  Mod I   Bathroom Transfers Mod I  Bowel/Bladder  Mod I assist  Transfers  modified independent basic; superviison car  Locomotion  modified independent gait x 15' household setting; modified independent w/c x 150' controlled setting and x 50 ' home setting  Communication     Cognition     Pain  < 4  Safety/Judgment  Mod I assist   Therapy Plan: PT Intensity: Minimum of 1-2 x/day ,45 to 90 minutes PT Frequency: 5 out of 7 days PT Duration Estimated Length of Stay: 7-10 OT Intensity: Minimum of 1-2 x/day, 45 to 90 minutes OT Frequency: 5 out of 7 days OT Duration/Estimated Length of Stay: 7-10 days      Team Interventions: Nursing Interventions Patient/Family Education, Pain Management, Medication Management, Discharge Planning, Bowel Management, Skin Care/Wound Management, Psychosocial Support, Disease Management/Prevention  PT  interventions Ambulation/gait training, Discharge planning, DME/adaptive equipment instruction, Functional mobility training, Pain management, Psychosocial support, Splinting/orthotics, Therapeutic Activities, UE/LE Strength taining/ROM, Visual/perceptual remediation/compensation, Training and development officer, Academic librarian, Neuromuscular re-education, Barrister's clerk education, IT trainer, Therapeutic Exercise, UE/LE Coordination activities, Wheelchair propulsion/positioning  OT Interventions Training and development officer, Discharge planning, DME/adaptive equipment instruction, Functional mobility training, Pain management, Psychosocial support, Patient/family education, Self Care/advanced ADL retraining, Therapeutic Activities, Therapeutic Exercise, UE/LE Strength taining/ROM  SLP Interventions    TR Interventions    SW/CM Interventions Discharge Planning, Psychosocial Support, Patient/Family Education   Barriers to Discharge MD  Medical stability, Wound care and Weight bearing restrictions  Nursing Decreased caregiver support, Wound Care, Inaccessible home environment Lives alone with steps to enter house.  PT      OT      SLP      SW       Team Discharge Planning: Destination: PT-Home ,OT- Home , SLP-  Projected Follow-up: PT-Home health PT, OT-  Home health OT, SLP-  Projected Equipment Needs: PT- , OT- Tub/shower bench, 3 in 1 bedside comode, SLP-  Equipment Details: PT-w/c, seat cushion, amputee pad L, OT-  Patient/family involved in discharge planning: PT- Patient,  OT-Patient, SLP-   MD ELOS: 7-10 days. Medical Rehab Prognosis:  Good Assessment: 78 year old right-handed male with history of diabetes mellitus, tobacco abuse, peripheral vascular disease.  Presented 09/17/2018 with ischemic left lower extremity and recent amputation of left third toe partial amputation of left second toe 09/10/2018 and was  discharged to home 09/11/2018 with home health therapies.  Aortogram  completed of left lower extremity showing aortoiliac segment very calcified and a very large posterior plaque in the distal external iliac common femoral artery.  The SFA was diffusely diseased and several segments of chronic total occlusion.  No change with conservative care.  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 Hospital course further complicated by postoperative pain as well as acute blood loss anemia.  Patient with resulting functional deficits with mobility, transfers, self-care.  We will set goals for Mod I with PT/OT.  Due to the current state of emergency, patients may not be receiving their 3-hours of Medicare-mandated therapy.  See Team Conference Notes for weekly updates to the plan of care

## 2018-09-24 NOTE — Progress Notes (Signed)
Orders received for pt transfer/discharge to CIR.  Telemetry monitor removed and CCMD notified.  Report called to receiving RN.

## 2018-09-24 NOTE — H&P (Signed)
Physical Medicine and Rehabilitation Admission H&P    Chief complaint: Stump pain HPI: Thomas Garcia is a 78 year old right-handed male with history of diabetes mellitus, tobacco abuse, peripheral vascular disease.  Per chart review and patient, patient lives alone with assistance of his brother for community ADLs.  Mobile home and son plans to build a ramp.  Independent household ambulator prior to admission.  Presented 09/17/2018 with ischemic left lower extremity and recent amputation of left third toe partial amputation of left second toe 09/10/2018 and was discharged to home 09/11/2018 with home health therapies.  Aortogram completed of left lower extremity showing aortoiliac segment very calcified and a very large posterior plaque in the distal external iliac common femoral artery.  The SFA was diffusely diseased and several segments of chronic total occlusion.  No change with conservative care.  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 Patient on vancomycin x48 hours postoperatively.  Hospital course further complicated by postoperative pain as well as acute blood loss anemia.  Subcutaneous heparin for DVT prophylaxis.  Therapy evaluations completed and patient was admitted for a comprehensive rehab program  Review of Systems  Constitutional: Negative for chills.  HENT: Negative for hearing loss.   Eyes: Negative for blurred vision and double vision.  Respiratory: Negative for cough and shortness of breath.   Cardiovascular: Positive for leg swelling. Negative for chest pain and palpitations.  Gastrointestinal: Positive for constipation. Negative for heartburn, nausea and vomiting.  Genitourinary: Negative for dysuria, flank pain and hematuria.  Musculoskeletal: Positive for joint pain and myalgias.  Skin: Negative for rash.  All other systems reviewed and are negative.  Past Medical History:   Diagnosis Date  . Diabetes mellitus without complication ([place])   . Kidney stones   . PONV (postoperative nausea and vomiting)    Past Surgical History:  Procedure Laterality Date  . ABDOMINAL AORTOGRAM W/LOWER EXTREMITY N/A 09/17/2018   Procedure: ABDOMINAL AORTOGRAM W/LOWER EXTREMITY;  Surgeon: Marty Heck, MD;  Location: [facility];  Service: Cardiovascular;  Laterality: N/A;  . AMPUTATION Left 09/22/2018   Procedure: AMPUTATION BELOW KNEE LEFT;  Surgeon: Marty Heck, MD;  Location: [facility];  Service: Vascular;  Laterality: Left;  . AMPUTATION TOE Left 09/10/2018   Procedure: AMPUTATION OF THIRD TOE LEFT FOOT, DEBRIDEMENT OF ULCERS ON SECOND TOE AND PARTIAL AMPUTATION SECOND TOE ON LEFT FOOT;  Surgeon: Virl Cagey, MD;  Location: [facility];  Service: General;  Laterality: Left;  . APPLICATION OF WOUND VAC Left 09/18/2018   Procedure: APPLICATION OF WOUND VAC;  Surgeon: Marty Heck, MD;  Location: [facility];  Service: Vascular;  Laterality: Left;  . CYSTOSCOPY  05/08/2012   Procedure: CYSTOSCOPY;  Surgeon: Marissa Nestle, MD;  Location: [facility];  Service: Urology;  Laterality: N/A;  Evacuation of clots  . KIDNEY SURGERY     sutures   . PERIPHERAL VASCULAR BALLOON ANGIOPLASTY  09/17/2018   Procedure: PERIPHERAL VASCULAR BALLOON ANGIOPLASTY;  Surgeon: Marty Heck, MD;  Location: [facility];  Service: Cardiovascular;;  LT SFA and AT  . SPLENECTOMY    . TRANSMETATARSAL AMPUTATION Left 09/18/2018   Procedure: TRANSMETATARSAL AMPUTATION LEFT FOOT;  Surgeon: Marty Heck, MD;  Location: [facility];  Service: Vascular;  Laterality: Left;  . TRANSURETHRAL RESECTION OF PROSTATE  05/14/2012   Procedure: TRANSURETHRAL RESECTION OF THE PROSTATE (TURP);  Surgeon: Marissa Nestle, MD;  Location: [facility];  Service: Urology;  Laterality: N/A;   History reviewed. No pertinent family history of amputation. Social History:  reports that he has been smoking  cigarettes. He has a 11.25 pack-year smoking history. He has never used smokeless tobacco. He reports that he does not drink alcohol or use drugs. Allergies: No Known Allergies Medications Prior to Admission  Medication Sig Dispense Refill  . amLODipine (NORVASC) 5 MG tablet Take 1 tablet (5 mg total) by mouth daily for 30 days. 30 tablet 0  . gabapentin (NEURONTIN) 100 MG capsule Take 1 capsule (100 mg total) by mouth 3 (three) times daily for 30 days. 90 capsule 0  . lisinopril (ZESTRIL) 5 MG tablet Take 1 tablet (5 mg total) by mouth daily for 30 days. 30 tablet 0  . metFORMIN (GLUCOPHAGE) 500 MG tablet Take 1 tablet (500 mg total) by mouth 2 (two) times daily with a meal. 60 tablet 11  . oxyCODONE-acetaminophen (PERCOCET/ROXICET) 5-325 MG tablet Take 1-2 tablets by mouth every 4 (four) hours as needed for severe pain. 20 tablet 0  . [EXPIRED] sulfamethoxazole-trimethoprim (BACTRIM DS) 800-160 MG tablet Take 1 tablet by mouth 2 (two) times daily for 10 days. 20 tablet 0    Drug Regimen Review Drug regimen was reviewed and remains appropriate with no significant issues identified  Home: Home Living Family/patient expects to be discharged to:: Private residence Living Arrangements: Alone Available Help at Discharge: Family, Available PRN/intermittently(Brother lives next door) Type of Home: Mobile home Home Access: Stairs to enter Technical brewer of Steps: 3 Entrance Stairs-Rails: Right, Left, Can reach both Home Layout: One level Bathroom Shower/Tub: Chiropodist: Standard Bathroom Accessibility: Yes Home Equipment: Environmental consultant - 2 wheels Additional Comments: has walking stick   Functional History: Prior Function Level of Independence: Independent, Needs assistance Gait / Transfers Assistance Needed: household ambulator ADL's / Homemaking Assistance Needed: assisted by his brother for community ADLs Comments: Thinks his son will build him a ramp   Functional Status:  Mobility: Bed Mobility Overal bed mobility: Modified Independent General bed mobility comments: head of bed raised Transfers Overall transfer level: Needs assistance Equipment used: Rolling walker (2 wheeled) Transfers: Sit to/from Stand Sit to Stand: Mod assist Stand pivot transfers: Mod assist, Min assist General transfer comment: increased time, pt needed assist to power up and steadying assist to maintain balance once standing.  Pt was able to pivot around with assist to move RW.  Overall fair stability.  Pt maintained flexed posture in trunk and right LE as well making transfer more difficult.       ADL: ADL Overall ADL's : Needs assistance/impaired Eating/Feeding: Modified independent, Sitting Grooming: Set up, Wash/dry hands, Wash/dry face, Sitting Upper Body Bathing: Set up, Sitting Lower Body Bathing: Minimal assistance, Bed level Upper Body Dressing : Set up, Sitting Lower Body Dressing: Minimal assistance, Bed level Toilet Transfer: Moderate assistance, Stand-pivot, RW  Cognition: Cognition Overall Cognitive Status: Within Functional Limits for tasks assessed Orientation Level: Oriented X4 Cognition Arousal/Alertness: Awake/alert Behavior During Therapy: WFL for tasks assessed/performed Overall Cognitive Status: Within Functional Limits for tasks assessed Area of Impairment: Problem solving, Awareness, Safety/judgement Safety/Judgement: Decreased awareness of safety, Decreased awareness of deficits Problem Solving: Difficulty sequencing, Requires verbal cues, Requires tactile cues  Physical Exam: Blood pressure (!) 119/51, pulse 82, temperature 98 F (36.7 C), temperature source Oral, resp. rate 15, height 5' 9.02" (1.753 m), weight 83.9 kg, SpO2 100 %. Physical Exam  Vitals reviewed. Constitutional: He appears well-developed and well-nourished.  HENT:  Head: Normocephalic and atraumatic.  Eyes: EOM are normal. Right eye exhibits no  discharge. Left eye exhibits no discharge.  Respiratory: Effort normal. No respiratory distress.  GI: He exhibits no distension.  Musculoskeletal:     Comments: Left BKA with edema and tenderness  Neurological: He is alert.  Follows commands.   Provides his name and age. Motor: Bilateral upper extremities, right lower extremity: 5/5 proximal distal Left lower extremity: Limited due to pain  Skin:  BKA site is dressed appropriately tender Some drainage through lateral incision  Psychiatric: His mood appears anxious. His speech is tangential. He is hyperactive.    Results for orders placed or performed during the hospital encounter of 09/17/18 (from the past 48 hour(s))  Glucose, capillary     Status: Abnormal   Collection Time: 09/22/18 12:30 PM  Result Value Ref Range   Glucose-Capillary 158 (H) 70 - 99 mg/dL   Comment 1 Notify RN    Comment 2 Document in Chart   Glucose, capillary     Status: Abnormal   Collection Time: 09/22/18  3:34 PM  Result Value Ref Range   Glucose-Capillary 165 (H) 70 - 99 mg/dL  Basic metabolic panel     Status: Abnormal   Collection Time: 09/23/18  3:59 AM  Result Value Ref Range   Sodium 137 135 - 145 mmol/L   Potassium 3.8 3.5 - 5.1 mmol/L   Chloride 103 98 - 111 mmol/L   CO2 27 22 - 32 mmol/L   Glucose, Bld 211 (H) 70 - 99 mg/dL   BUN 11 8 - 23 mg/dL   Creatinine, Ser 0.83 0.61 - 1.24 mg/dL   Calcium 8.2 (L) 8.9 - 10.3 mg/dL   GFR calc non Af Amer >60 >60 mL/min   GFR calc Af Amer >60 >60 mL/min   Anion gap 7 5 - 15    Comment: Performed at [facility], [address] [place], [place] [address]  CBC     Status: Abnormal   Collection Time: 09/23/18  3:59 AM  Result Value Ref Range   WBC 17.4 (H) 4.0 - 10.5 K/uL   RBC 2.68 (L) 4.22 - 5.81 MIL/uL   Hemoglobin 7.9 (L) 13.0 - 17.0 g/dL   HCT 24.1 (L) 39.0 - 52.0 %   MCV 89.9 80.0 - 100.0 fL   MCH 29.5 26.0 - 34.0 pg   MCHC 32.8 30.0 - 36.0 g/dL   RDW 13.9 11.5 - 15.5 %   Platelets  512 (H) 150 - 400 K/uL   nRBC 0.0 0.0 - 0.2 %    Comment: Performed at [facility] [address] [place], [place] [address]  Glucose, capillary     Status: Abnormal   Collection Time: 09/23/18  6:44 AM  Result Value Ref Range   Glucose-Capillary 221 (H) 70 - 99 mg/dL  Basic metabolic panel     Status: Abnormal   Collection Time: 09/24/18  4:46 AM  Result Value Ref Range   Sodium 137 135 - 145 mmol/L   Potassium 3.6 3.5 - 5.1 mmol/L   Chloride 105 98 - 111 mmol/L   CO2 24 22 - 32 mmol/L   Glucose, Bld 183 (H) 70 - 99 mg/dL   BUN 12 8 - 23 mg/dL   Creatinine, Ser 0.90 0.61 - 1.24 mg/dL   Calcium 8.2 (L) 8.9 - 10.3 mg/dL   GFR calc non Af Amer >60 >60 mL/min   GFR calc Af Amer >  60 >60 mL/min   Anion gap 8 5 - 15    Comment: Performed at [facility] [address], [place], [place] [address]  CBC     Status: Abnormal   Collection Time: 09/24/18  4:46 AM  Result Value Ref Range   WBC 11.8 (H) 4.0 - 10.5 K/uL   RBC 2.49 (L) 4.22 - 5.81 MIL/uL   Hemoglobin 7.3 (L) 13.0 - 17.0 g/dL   HCT 22.5 (L) 39.0 - 52.0 %   MCV 90.4 80.0 - 100.0 fL   MCH 29.3 26.0 - 34.0 pg   MCHC 32.4 30.0 - 36.0 g/dL   RDW 14.4 11.5 - 15.5 %   Platelets 522 (H) 150 - 400 K/uL   nRBC 0.0 0.0 - 0.2 %    Comment: Performed at [facility], [place] [address] [place], [place] [address]  Glucose, capillary     Status: Abnormal   Collection Time: 09/24/18  6:25 AM  Result Value Ref Range   Glucose-Capillary 187 (H) 70 - 99 mg/dL   No results found.     Medical Problem List and Plan: 1.  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  Admit to CIR  To complete course of IV Vanc for infection.  2.  Antithrombotics: -DVT/anticoagulation: Subcutaneous heparin  -antiplatelet therapy: Aspirin 81 mg daily, Plavix 75 mg daily 3. Pain Management: Neurontin 100 mg 3 times daily, oxycodone as needed 4. Mood: Provide emotional support  -antipsychotic agents: N/A 5. Neuropsych:  This patient is capable of making decisions on his own behalf. 6. Skin/Wound Care: Routine skin checks 7. Fluids/Electrolytes/Nutrition: Routine in and outs. Follow up BMP tomorrow AM.  8.  Diabetes mellitus with peripheral neuropathy.  Hemoglobin A1c 9.0.  Resume Glucophage 500 mg twice daily. Monitor with increased mobility.  9.  Hyperlipidemia.  Lipitor 10.  History of hypertension.  Patient on lisinopril 5 mg daily prior to admission.  Resume as needed. Monitor with increased mobility.  11.  Tobacco abuse.  Counseling  Post Admission Physician Evaluation: 1. Preadmission assessment reviewed and changes made below. 2. Functional deficits secondary  to left BKA. 3. Patient is admitted to receive collaborative, interdisciplinary care between the physiatrist, rehab nursing staff, and therapy team. 4. Patient's level of medical complexity and substantial therapy needs in context of that medical necessity cannot be provided at a lesser intensity of care such as a SNF. 5. Patient has experienced substantial functional loss from his/her baseline which was documented above under the "Functional History" and "Functional Status" headings.  Judging by the patient's diagnosis, physical exam, and functional history, the patient has potential for functional progress which will result in measurable gains while on inpatient rehab.  These gains will be of substantial and practical use upon discharge  in facilitating mobility and self-care at the household level. 6. Physiatrist will provide 24 hour management of medical needs as well as oversight of the therapy plan/treatment and provide guidance as appropriate regarding the interaction of the two. 7. 24 hour rehab nursing will assist with bowel management, safety, skin/wound care, disease management, pain management and patient education  and help integrate therapy concepts, techniques,education, etc. 8. PT will assess and treat for/with: Lower extremity strength,  range of motion, stamina, balance, functional mobility, safety, adaptive techniques and equipment, wound care, coping skills, pain control, pre-prosthetic education. Goals are: Supervision/min a. 9. OT will assess and treat for/with: ADL's, functional mobility, safety, upper extremity strength, adaptive techniques and equipment, wound mgt,  ego support, and community reintegration.   Goals are: Supervision/min a. Therapy may not proceed with showering this patient. 10. Case Management and Social Worker will assess and treat for psychological issues and discharge planning. 11. Team conference will be held weekly to assess progress toward goals and to determine barriers to discharge. 12. Patient will receive at least 3 hours of therapy per day at least 5 days per week. 13. ELOS: 9-14 days.       14. Prognosis:  good  I have personally performed a face to face diagnostic evaluation, including, but not limited to relevant history and physical exam findings, of this patient and developed relevant assessment and plan.  Additionally, I have reviewed and concur with the physician assistant's documentation above.  Delice Lesch, MD, ABPMR Lavon Paganini Angiulli, PA-C 09/24/2018

## 2018-09-24 NOTE — H&P (Signed)
Physical Medicine and Rehabilitation Admission H&P    Chief complaint: Stump pain HPI: Thomas Garcia. Kanaan is a 78 year old right-handed male with history of diabetes mellitus, tobacco abuse, peripheral vascular disease.  Per chart review and patient, patient lives alone with assistance of his brother for community ADLs.  Mobile home and son plans to build a ramp.  Independent household ambulator prior to admission.  Presented 09/17/2018 with ischemic left lower extremity and recent amputation of left third toe partial amputation of left second toe 09/10/2018 and was discharged to home 09/11/2018 with home health therapies.  Aortogram completed of left lower extremity showing aortoiliac segment very calcified and a very large posterior plaque in the distal external iliac common femoral artery.  The SFA was diffusely diseased and several segments of chronic total occlusion.  No change with conservative care.  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 Patient on vancomycin x48 hours postoperatively.  Hospital course further complicated by postoperative pain as well as acute blood loss anemia.  Subcutaneous heparin for DVT prophylaxis.  Therapy evaluations completed and patient was admitted for a comprehensive rehab program  Review of Systems  Constitutional: Negative for chills.  HENT: Negative for hearing loss.   Eyes: Negative for blurred vision and double vision.  Respiratory: Negative for cough and shortness of breath.   Cardiovascular: Positive for leg swelling. Negative for chest pain and palpitations.  Gastrointestinal: Positive for constipation. Negative for heartburn, nausea and vomiting.  Genitourinary: Negative for dysuria, flank pain and hematuria.  Musculoskeletal: Positive for joint pain and myalgias.  Skin: Negative for rash.  All other systems reviewed and are negative.  Past Medical History:   Diagnosis Date  . Diabetes mellitus without complication ([place])   . Kidney stones   . PONV (postoperative nausea and vomiting)    Past Surgical History:  Procedure Laterality Date  . ABDOMINAL AORTOGRAM W/LOWER EXTREMITY N/A 09/17/2018   Procedure: ABDOMINAL AORTOGRAM W/LOWER EXTREMITY;  Surgeon: Marty Heck, MD;  Location: [facility];  Service: Cardiovascular;  Laterality: N/A;  . AMPUTATION Left 09/22/2018   Procedure: AMPUTATION BELOW KNEE LEFT;  Surgeon: Marty Heck, MD;  Location: [facility];  Service: Vascular;  Laterality: Left;  . AMPUTATION TOE Left 09/10/2018   Procedure: AMPUTATION OF THIRD TOE LEFT FOOT, DEBRIDEMENT OF ULCERS ON SECOND TOE AND PARTIAL AMPUTATION SECOND TOE ON LEFT FOOT;  Surgeon: Virl Cagey, MD;  Location: [facility];  Service: General;  Laterality: Left;  . APPLICATION OF WOUND VAC Left 09/18/2018   Procedure: APPLICATION OF WOUND VAC;  Surgeon: Marty Heck, MD;  Location: [facility];  Service: Vascular;  Laterality: Left;  . CYSTOSCOPY  05/08/2012   Procedure: CYSTOSCOPY;  Surgeon: Marissa Nestle, MD;  Location: [facility];  Service: Urology;  Laterality: N/A;  Evacuation of clots  . KIDNEY SURGERY     sutures   . PERIPHERAL VASCULAR BALLOON ANGIOPLASTY  09/17/2018   Procedure: PERIPHERAL VASCULAR BALLOON ANGIOPLASTY;  Surgeon: Marty Heck, MD;  Location: [facility];  Service: Cardiovascular;;  LT SFA and AT  . SPLENECTOMY    . TRANSMETATARSAL AMPUTATION Left 09/18/2018   Procedure: TRANSMETATARSAL AMPUTATION LEFT FOOT;  Surgeon: Marty Heck, MD;  Location: [facility];  Service: Vascular;  Laterality: Left;  . TRANSURETHRAL RESECTION OF PROSTATE  05/14/2012   Procedure: TRANSURETHRAL RESECTION OF THE PROSTATE (TURP);  Surgeon: Marissa Nestle, MD;  Location: [facility];  Service: Urology;  Laterality: N/A;   History reviewed. No pertinent family history of amputation. Social History:  reports that he has been smoking  cigarettes. He has a 11.25 pack-year smoking history. He has never used smokeless tobacco. He reports that he does not drink alcohol or use drugs. Allergies: No Known Allergies Medications Prior to Admission  Medication Sig Dispense Refill  . amLODipine (NORVASC) 5 MG tablet Take 1 tablet (5 mg total) by mouth daily for 30 days. 30 tablet 0  . gabapentin (NEURONTIN) 100 MG capsule Take 1 capsule (100 mg total) by mouth 3 (three) times daily for 30 days. 90 capsule 0  . lisinopril (ZESTRIL) 5 MG tablet Take 1 tablet (5 mg total) by mouth daily for 30 days. 30 tablet 0  . metFORMIN (GLUCOPHAGE) 500 MG tablet Take 1 tablet (500 mg total) by mouth 2 (two) times daily with a meal. 60 tablet 11  . oxyCODONE-acetaminophen (PERCOCET/ROXICET) 5-325 MG tablet Take 1-2 tablets by mouth every 4 (four) hours as needed for severe pain. 20 tablet 0  . [EXPIRED] sulfamethoxazole-trimethoprim (BACTRIM DS) 800-160 MG tablet Take 1 tablet by mouth 2 (two) times daily for 10 days. 20 tablet 0    Drug Regimen Review Drug regimen was reviewed and remains appropriate with no significant issues identified  Home: Home Living Family/patient expects to be discharged to:: Private residence Living Arrangements: Alone Available Help at Discharge: Family, Available PRN/intermittently(Brother lives next door) Type of Home: Mobile home Home Access: Stairs to enter Technical brewer of Steps: 3 Entrance Stairs-Rails: Right, Left, Can reach both Home Layout: One level Bathroom Shower/Tub: Chiropodist: Standard Bathroom Accessibility: Yes Home Equipment: Environmental consultant - 2 wheels Additional Comments: has walking stick   Functional History: Prior Function Level of Independence: Independent, Needs assistance Gait / Transfers Assistance Needed: household ambulator ADL's / Homemaking Assistance Needed: assisted by his brother for community ADLs Comments: Thinks his son will build him a ramp   Functional Status:  Mobility: Bed Mobility Overal bed mobility: Modified Independent General bed mobility comments: head of bed raised Transfers Overall transfer level: Needs assistance Equipment used: Rolling walker (2 wheeled) Transfers: Sit to/from Stand Sit to Stand: Mod assist Stand pivot transfers: Mod assist, Min assist General transfer comment: increased time, pt needed assist to power up and steadying assist to maintain balance once standing.  Pt was able to pivot around with assist to move RW.  Overall fair stability.  Pt maintained flexed posture in trunk and right LE as well making transfer more difficult.       ADL: ADL Overall ADL's : Needs assistance/impaired Eating/Feeding: Modified independent, Sitting Grooming: Set up, Wash/dry hands, Wash/dry face, Sitting Upper Body Bathing: Set up, Sitting Lower Body Bathing: Minimal assistance, Bed level Upper Body Dressing : Set up, Sitting Lower Body Dressing: Minimal assistance, Bed level Toilet Transfer: Moderate assistance, Stand-pivot, RW  Cognition: Cognition Overall Cognitive Status: Within Functional Limits for tasks assessed Orientation Level: Oriented X4 Cognition Arousal/Alertness: Awake/alert Behavior During Therapy: WFL for tasks assessed/performed Overall Cognitive Status: Within Functional Limits for tasks assessed Area of Impairment: Problem solving, Awareness, Safety/judgement Safety/Judgement: Decreased awareness of safety, Decreased awareness of deficits Problem Solving: Difficulty sequencing, Requires verbal cues, Requires tactile cues  Physical Exam: Blood pressure (!) 119/51, pulse 82, temperature 98 F (36.7 C), temperature source Oral, resp. rate 15, height 5' 9.02" (1.753 m), weight 83.9 kg, SpO2 100 %. Physical Exam  Vitals reviewed. Constitutional: He appears well-developed and well-nourished.  HENT:  Head: Normocephalic and atraumatic.  Eyes: EOM are normal. Right eye exhibits no  discharge. Left eye exhibits no discharge.  Respiratory: Effort normal. No respiratory distress.  GI: He exhibits no distension.  Musculoskeletal:     Comments: Left BKA with edema and tenderness  Neurological: He is alert.  Follows commands.   Provides his name and age. Motor: Bilateral upper extremities, right lower extremity: 5/5 proximal distal Left lower extremity: Limited due to pain  Skin:  BKA site is dressed appropriately tender Some drainage through lateral incision  Psychiatric: His mood appears anxious. His speech is tangential. He is hyperactive.    Results for orders placed or performed during the hospital encounter of 09/17/18 (from the past 48 hour(s))  Glucose, capillary     Status: Abnormal   Collection Time: 09/22/18 12:30 PM  Result Value Ref Range   Glucose-Capillary 158 (H) 70 - 99 mg/dL   Comment 1 Notify RN    Comment 2 Document in Chart   Glucose, capillary     Status: Abnormal   Collection Time: 09/22/18  3:34 PM  Result Value Ref Range   Glucose-Capillary 165 (H) 70 - 99 mg/dL  Basic metabolic panel     Status: Abnormal   Collection Time: 09/23/18  3:59 AM  Result Value Ref Range   Sodium 137 135 - 145 mmol/L   Potassium 3.8 3.5 - 5.1 mmol/L   Chloride 103 98 - 111 mmol/L   CO2 27 22 - 32 mmol/L   Glucose, Bld 211 (H) 70 - 99 mg/dL   BUN 11 8 - 23 mg/dL   Creatinine, Ser 0.83 0.61 - 1.24 mg/dL   Calcium 8.2 (L) 8.9 - 10.3 mg/dL   GFR calc non Af Amer >60 >60 mL/min   GFR calc Af Amer >60 >60 mL/min   Anion gap 7 5 - 15    Comment: Performed at [facility], [address] [place], [place] [address]  CBC     Status: Abnormal   Collection Time: 09/23/18  3:59 AM  Result Value Ref Range   WBC 17.4 (H) 4.0 - 10.5 K/uL   RBC 2.68 (L) 4.22 - 5.81 MIL/uL   Hemoglobin 7.9 (L) 13.0 - 17.0 g/dL   HCT 24.1 (L) 39.0 - 52.0 %   MCV 89.9 80.0 - 100.0 fL   MCH 29.5 26.0 - 34.0 pg   MCHC 32.8 30.0 - 36.0 g/dL   RDW 13.9 11.5 - 15.5 %   Platelets  512 (H) 150 - 400 K/uL   nRBC 0.0 0.0 - 0.2 %    Comment: Performed at [facility] [address], [place], [place] [address]  Glucose, capillary     Status: Abnormal   Collection Time: 09/23/18  6:44 AM  Result Value Ref Range   Glucose-Capillary 221 (H) 70 - 99 mg/dL  Basic metabolic panel     Status: Abnormal   Collection Time: 09/24/18  4:46 AM  Result Value Ref Range   Sodium 137 135 - 145 mmol/L   Potassium 3.6 3.5 - 5.1 mmol/L   Chloride 105 98 - 111 mmol/L   CO2 24 22 - 32 mmol/L   Glucose, Bld 183 (H) 70 - 99 mg/dL   BUN 12 8 - 23 mg/dL   Creatinine, Ser 0.90 0.61 - 1.24 mg/dL   Calcium 8.2 (L) 8.9 - 10.3 mg/dL   GFR calc non Af Amer >60 >60 mL/min   GFR calc Af Amer >  60 >60 mL/min   Anion gap 8 5 - 15    Comment: Performed at [facility] [address] [place], [place] [address]  CBC     Status: Abnormal   Collection Time: 09/24/18  4:46 AM  Result Value Ref Range   WBC 11.8 (H) 4.0 - 10.5 K/uL   RBC 2.49 (L) 4.22 - 5.81 MIL/uL   Hemoglobin 7.3 (L) 13.0 - 17.0 g/dL   HCT 22.5 (L) 39.0 - 52.0 %   MCV 90.4 80.0 - 100.0 fL   MCH 29.3 26.0 - 34.0 pg   MCHC 32.4 30.0 - 36.0 g/dL   RDW 14.4 11.5 - 15.5 %   Platelets 522 (H) 150 - 400 K/uL   nRBC 0.0 0.0 - 0.2 %    Comment: Performed at [facility], [place] [address], [place],  [address]  Glucose, capillary     Status: Abnormal   Collection Time: 09/24/18  6:25 AM  Result Value Ref Range   Glucose-Capillary 187 (H) 70 - 99 mg/dL   No results found.     Medical Problem List and Plan: 1.  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  Admit to CIR  To complete course of IV Vanc for infection.  2.  Antithrombotics: -DVT/anticoagulation: Subcutaneous heparin  -antiplatelet therapy: Aspirin 81 mg daily, Plavix 75 mg daily 3. Pain Management: Neurontin 100 mg 3 times daily, oxycodone as needed 4. Mood: Provide emotional support  -antipsychotic agents: N/A 5. Neuropsych:  This patient is capable of making decisions on his own behalf. 6. Skin/Wound Care: Routine skin checks 7. Fluids/Electrolytes/Nutrition: Routine in and outs. Follow up BMP tomorrow AM.  8.  Diabetes mellitus with peripheral neuropathy.  Hemoglobin A1c 9.0.  Resume Glucophage 500 mg twice daily. Monitor with increased mobility.  9.  Hyperlipidemia.  Lipitor 10.  History of hypertension.  Patient on lisinopril 5 mg daily prior to admission.  Resume as needed. Monitor with increased mobility.  11.  Tobacco abuse.  Counseling  Cathlyn Parsons, PA-C 09/24/2018

## 2018-09-24 NOTE — Progress Notes (Signed)
Jamse Arn, MD  Physician  Physical Medicine and Rehabilitation  PMR Pre-admission  Signed  Date of Service:  09/23/2018 7:01 PM      Related encounter: Admission (Discharged) from 09/17/2018 in [facility] 4E [facility] [place]      Signed         PMR Admission Coordinator Pre-Admission Assessment  Patient: Thomas Garcia is an 78 y.o., male MRN: [id number] DOB: 1940-08-30 Height: 5' 9.02" (175.3 cm) Weight: 83.9 kg  Insurance Information HMO:     PPO:      PCP:      IPA:      80/20: yes     OTHER:  PRIMARY: Medicare A and B      Policy#: [id number]      Subscriber: Patient CM Name:       Phone#:      Fax#:  Pre-Cert#:       Employer:  Benefits:  Phone #: NA     Name: verified eligibility via [place] on 09/23/18 Eff. Date: part A 08/08/07; part B 08/08/07     Deduct: $1,408      Out of Pocket Max: NA      Life Max: NA CIR: Covered per Medicare guidelines once yearly deductible is met      SNF: days 1-20, 100%, days 21-100, 80% Outpatient: 80%     Co-Pay: 20% Home Health: 100%      Co-Pay:  DME: 80%     Co-Pay: 20% Providers: Pt's choice SECONDARY:      Policy#:       Subscriber:  CM Name:       Phone#:      Fax#:  Pre-Cert#:       Employer:  Benefits:  Phone #:      Name:  Eff. Date:      Deduct:       Out of Pocket Max:       Life Max:  CIR:       SNF:  Outpatient:      Co-Pay:  Home Health:       Co-Pay:  DME:      Co-Pay:   Medicaid Application Date:       Case Manager:  Disability Application Date:       Case Worker:   The "Data Collection Information Summary" for patients in Inpatient Rehabilitation Facilities with attached "Privacy Act [place] Records" was provided and verbally reviewed with: Patient  Emergency Contact Information         Contact Information    Name Relation Home Work Mobile   Eicher,michael Son [phone]        Current Medical History  Patient Admitting Diagnosis: L BKA History of Present  Illness: Thomas Garcia is a 78 year old male with history of diabetes mellitus, tobacco abuse, peripheral vascular disease. Presented 09/17/2018 with ischemic left lower extremity and recent amputation of left third toe partial amputation of left second toe 09/10/2018 and was discharged to home 09/11/2018 with home health therapies. Aortogram completed of left lower extremity showing aortoiliac segment very calcified and a very large posterior plaque in the distal external iliac common femoral artery. The SFA was diffusely diseased and several segments of chronic total occlusion. No change with conservative care and underwent left transmetatarsal amputation wound VAC placement 09/18/2018 per Dr. 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 Patient on vancomycin x48 hours postoperatively. Hospital course pain management. Acute blood loss anemia 7.3.  Subcutaneous heparin for DVT prophylaxis. Therapy evaluations completed with recommendations for CIR. Patient is to be admitted for a comprehensive rehab program on 09/24/18.    Patient's medical record from [facility] has been reviewed by the rehabilitation admission coordinator and physician.  Past Medical History      Past Medical History:  Diagnosis Date  . Diabetes mellitus without complication ([place])   . Kidney stones   . PONV (postoperative nausea and vomiting)     Family History   family history is not on file.  Prior Rehab/Hospitalizations Has the patient had prior rehab or hospitalizations prior to admission? No  Has the patient had major surgery during 100 days prior to admission? Yes             Current Medications  Current Facility-Administered Medications:  .  0.9 %  sodium chloride infusion, , Intravenous, Continuous, Rhyne, Hulen Shouts, PA-C, Stopped at 09/22/18 1817 .  acetaminophen (TYLENOL) tablet 650 mg, 650 mg, Oral, Q4H PRN, Rhyne, Samantha J, PA-C .  alum & mag  hydroxide-simeth (MAALOX/MYLANTA) 200-200-20 MG/5ML suspension 15-30 mL, 15-30 mL, Oral, Q2H PRN, Rhyne, Samantha J, PA-C .  aspirin EC tablet 81 mg, 81 mg, Oral, Daily, Rhyne, Samantha J, PA-C, 81 mg at 09/24/18 0821 .  atorvastatin (LIPITOR) tablet 10 mg, 10 mg, Oral, q1800, Rhyne, Samantha J, PA-C, 10 mg at 09/23/18 1800 .  clopidogrel (PLAVIX) tablet 75 mg, 75 mg, Oral, Daily, Rhyne, Samantha J, PA-C, 75 mg at 09/24/18 0820 .  docusate sodium (COLACE) capsule 100 mg, 100 mg, Oral, Daily, Rhyne, Samantha J, PA-C .  gabapentin (NEURONTIN) capsule 100 mg, 100 mg, Oral, TID, Rhyne, Samantha J, PA-C, 100 mg at 09/24/18 0821 .  guaiFENesin-dextromethorphan (ROBITUSSIN DM) 100-10 MG/5ML syrup 15 mL, 15 mL, Oral, Q4H PRN, Rhyne, Samantha J, PA-C .  heparin injection 5,000 Units, 5,000 Units, Subcutaneous, Q8H, Rhyne, Samantha J, PA-C, 5,000 Units at 09/24/18 0524 .  hydrALAZINE (APRESOLINE) injection 5 mg, 5 mg, Intravenous, Q20 Min PRN, Rhyne, Samantha J, PA-C .  labetalol (NORMODYNE) injection 10 mg, 10 mg, Intravenous, Q10 min PRN, Rhyne, Samantha J, PA-C .  magnesium sulfate IVPB 2 g 50 mL, 2 g, Intravenous, Daily PRN, Rhyne, Samantha J, PA-C .  metoprolol tartrate (LOPRESSOR) injection 2-5 mg, 2-5 mg, Intravenous, Q2H PRN, Rhyne, Samantha J, PA-C .  morphine 2 MG/ML injection 2 mg, 2 mg, Intravenous, Q2H PRN, Rhyne, Samantha J, PA-C, 2 mg at 09/22/18 1818 .  ondansetron (ZOFRAN) injection 4 mg, 4 mg, Intravenous, Q6H PRN, Rhyne, Samantha J, PA-C .  oxyCODONE-acetaminophen (PERCOCET/ROXICET) 5-325 MG per tablet 1-2 tablet, 1-2 tablet, Oral, Q4H PRN, Rhyne, Samantha J, PA-C, 2 tablet at 09/24/18 1235 .  pantoprazole (PROTONIX) EC tablet 40 mg, 40 mg, Oral, Daily, Rhyne, Samantha J, PA-C, 40 mg at 09/24/18 0820 .  phenol (CHLORASEPTIC) mouth spray 1 spray, 1 spray, Mouth/Throat, PRN, Rhyne, Samantha J, PA-C .  piperacillin-tazobactam (ZOSYN) IVPB 3.375 g, 3.375 g, Intravenous, Q8H, Marty Heck, MD, Last Rate: 12.5 mL/hr at 09/24/18 0644, 3.375 g at 09/24/18 0644 .  potassium chloride SA (K-DUR) CR tablet 20-40 mEq, 20-40 mEq, Oral, Daily PRN, Rhyne, Samantha J, PA-C .  sodium chloride flush (NS) 0.9 % injection 10-40 mL, 10-40 mL, Intracatheter, PRN, Rhyne, Samantha J, PA-C .  vancomycin (VANCOCIN) IVPB 1000 mg/200 mL premix, 1,000 mg, Intravenous, Q12H, Marty Heck, MD, Last Rate: 200 mL/hr at 09/24/18 0525, 1,000 mg at 09/24/18 0525  Patients Current Diet:  Diet Order                  Diet Carb Modified Fluid consistency: Thin; Room service appropriate? Yes with Assist  Diet effective now               Precautions / Restrictions Precautions Precautions: Fall Precaution Comments: recent left BKA Restrictions Weight Bearing Restrictions: Yes LLE Weight Bearing: Non weight bearing LLE Partial Weight Bearing Percentage or Pounds: (BKA)   Has the patient had 2 or more falls or a fall with injury in the past year? No  Prior Activity Level Community (5-7x/wk): retired Research officer, trade union main; drove PTA; did walk dogs approx 3-4x/week. Indepdendent without AD PTA  Prior Functional Level Self Care: Did the patient need help bathing, dressing, using the toilet or eating? Independent  Indoor Mobility: Did the patient need assistance with walking from room to room (with or without device)? Independent  Stairs: Did the patient need assistance with internal or external stairs (with or without device)? Independent  Functional Cognition: Did the patient need help planning regular tasks such as shopping or remembering to take medications? [place] Devices/Equipment: Gilford Rile (specify type) Home Equipment: Walker - 2 wheels  Prior Device Use: Indicate devices/aids used by the patient prior to current illness, exacerbation or injury? None of the above  Current Functional Level Cognition  Overall  Cognitive Status: Within Functional Limits for tasks assessed Orientation Level: Oriented X4 Safety/Judgement: Decreased awareness of safety, Decreased awareness of deficits    Extremity Assessment (includes Sensation/Coordination)  Upper Extremity Assessment: Overall WFL for tasks assessed  Lower Extremity Assessment: Defer to PT evaluation LLE Deficits / Details: grossly 3-/5 LLE: Unable to fully assess due to pain    ADLs  Overall ADL's : Needs assistance/impaired Eating/Feeding: Modified independent, Sitting Grooming: Set up, Wash/dry hands, Wash/dry face, Sitting Upper Body Bathing: Set up, Sitting Lower Body Bathing: Minimal assistance, Bed level Upper Body Dressing : Set up, Sitting Lower Body Dressing: Minimal assistance, Bed level Toilet Transfer: Moderate assistance, Stand-pivot, RW    Mobility  Overal bed mobility: Modified Independent General bed mobility comments: head of bed raised    Transfers  Overall transfer level: Needs assistance Equipment used: Rolling walker (2 wheeled) Transfers: Sit to/from Stand Sit to Stand: Mod assist Stand pivot transfers: Mod assist, Min assist General transfer comment: increased time, pt needed assist to power up and steadying assist to maintain balance once standing.  Pt was able to pivot around with assist to move RW.  Overall fair stability.  Pt maintained flexed posture in trunk and right LE as well making transfer more difficult.     Ambulation / Gait / Stairs / Office manager / Balance Balance Overall balance assessment: Needs assistance Sitting-balance support: Feet supported, No upper extremity supported Sitting balance-Leahy Scale: Good Standing balance support: Bilateral upper extremity supported, During functional activity Standing balance-Leahy Scale: Poor Standing balance comment: relies on UE support and RW for balance     Special needs/care consideration BiPAP/CPAP : no CPM : no  Continuous Drip IV : piperacillin-taxobactam, vancomycin Dialysis : no        Days : no Life Vest : no Oxygen : no Special Bed : no Trach Size : no Wound Vac (area) : no      Location : no Skin : rash to right and left posterior thigh; surgical incision to LLE (BKA)  Bowel mgmt: some incontinence in hospital; last BM: 09/23/18 Bladder mgmt: some incontinence at baseline per pt Diabetic mgmt: yes Behavioral consideration : no Chemo/radiation : no   Previous Home Environment (from acute therapy documentation) Living Arrangements: Alone Available Help at Discharge: Family, Available PRN/intermittently(Brother lives next door) Type of Home: Mobile home Home Layout: One level Home Access: Stairs to enter Entrance Stairs-Rails: Right, Left, Can reach both Entrance Stairs-Number of Steps: 3 Bathroom Shower/Tub: Optometrist: Yes How Accessible: Accessible via walker [place]: No Additional Comments: has walking stick  Discharge Living Setting Plans for Discharge Living Setting: Patient's home, Lives with (comment), Mobile Home(has brother next door on property) Type of Home at Discharge: Mobile home Discharge Home Layout: One level Discharge Home Access: Stairs to enter Entrance Stairs-Rails: Right, Left Entrance Stairs-Number of Steps: 3 Discharge Bathroom Shower/Tub: Tub/shower unit Discharge Bathroom Toilet: Standard Discharge Bathroom Accessibility: No(not for RW per son) Does the patient have any problems obtaining your medications?: No  Social/Family/Support Systems Patient Roles: Other (Comment)(brother lives on property; son is 8 miles away) Contact Information: son: [phone] Legrand Como); brother [name]): [phone] Anticipated Caregiver: son and brother Anticipated Caregiver's Contact Information: see above Ability/Limitations of Caregiver: intermittant A Caregiver Availability:  Intermittent Discharge Plan Discussed with Primary Caregiver: Yes(son and brother) Is Caregiver In Agreement with Plan?: Yes Does Caregiver/Family have Issues with Lodging/Transportation while Pt is in Rehab?: No  Goals/Additional Needs Patient/Family Goal for Rehab: PT/OT: Mod I/Supervision; SLP: NA Expected length of stay: 7-10 days Cultural Considerations: NA Dietary Needs: carb modified (cal level 1600-2000); thin liquids; room service with assist.  Equipment Needs: TBD Special Service Needs: amputation  Pt/Family Agrees to Admission and willing to participate: Yes Program Orientation Provided & Reviewed with Pt/Caregiver Including Roles  & Responsibilities: Yes(son and brother (and pt))  Barriers to Discharge: Home environment access/layout  Barriers to Discharge Comments: steps to enter; small bathroom not conduvice to RW; pt open to [facility] in living room; discussed ramp needs with pt and son  Decrease burden of Care through IP rehab admission: NA  Possible need for SNF placement upon discharge: Not anticipated; pt has good prognosis for further progress through CIR. Pt has good social support from family who are willing and able to physically assist as needed at DC.   Patient Condition: I have reviewed medical records from [facility], spoken with CM, RN, and MD, and patient and son and brother. I met with patient at the bedside for inpatient rehabilitation assessment.  Patient will benefit from ongoing PT and OT, can actively participate in 3 hours of therapy a day 5 days of the week, and can make measurable gains during the admission.  Patient will also benefit from the coordinated team approach during an Inpatient Acute Rehabilitation admission.  The patient will receive intensive therapy as well as Rehabilitation physician, nursing, social worker, and care management interventions.  Due to bladder management, bowel management, safety, skin/wound care, disease  management, medication administration, pain management and patient education the patient requires 24 hour a day rehabilitation nursing.  The patient is currently Mod A with transfers and overall Min A for LB basic ADLs.  Discharge setting and therapy post discharge at home with home health is anticipated.  Patient has agreed to participate in the Acute Inpatient Rehabilitation Program and will admit today.  Preadmission Screen Completed By:  Jhonnie Garner, 09/24/2018 12:57 PM ______________________________________________________________________   Discussed status with Dr. Posey Pronto  on 09/24/18 at  12:57PM and received approval for admission today.  Admission Coordinator:  Jhonnie Garner, OT, time 12:57PM/Date 09/24/18   Assessment/Plan: Diagnosis:  L BKA  1. Does the need for close, 24 hr/day Medical supervision in concert with the patient's rehab needs make it unreasonable for this patient to be served in a less intensive setting? Yes  2. Co-Morbidities requiring supervision/potential complications: diabetes mellitus, tobacco abuse, peripheral vascular disease 3. Due to bowel management, safety, skin/wound care, disease management, pain management and patient education, does the patient require 24 hr/day rehab nursing? Yes 4. Does the patient require coordinated care of a physician, rehab nurse, PT (1-2 hrs/day, 5 days/week) and OT (1-2 hrs/day, 5 days/week) to address physical and functional deficits in the context of the above medical diagnosis(es)? Yes Addressing deficits in the following areas: balance, endurance, locomotion, strength, transferring, bowel/bladder control, bathing, dressing, toileting and psychosocial support 5. Can the patient actively participate in an intensive therapy program of at least 3 hrs of therapy 5 days a week? Yes 6. The potential for patient to make measurable gains while on inpatient rehab is excellent 7. Anticipated functional outcomes upon discharge from inpatients  are: supervision PT, supervision OT, n/a SLP 8. Estimated rehab length of stay to reach the above functional goals is: 8-12 days. 9. Anticipated D/C setting: Home 10. Anticipated post D/C treatments: HH therapy and Home excercise program 11. Overall Rehab/Functional Prognosis: good  MD Signature: Delice Lesch, MD, ABPMR        Revision History Date/Time User Provider Type Action  09/24/2018 1:11 PM Jamse Arn, MD Physician Sign  09/24/2018 12:57 PM Jhonnie Garner, [place] [facility] Admission Coordinator Share  View Details Report

## 2018-09-24 NOTE — Progress Notes (Signed)
Physical Therapy Treatment Patient Details Name: Thomas Garcia MRN: [id number] DOB: Aug 01, 1940 Today's Date: 09/24/2018    History of Present Illness Thomas Garcia is a 78 y.o. 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  He reports one year of severe pain in the left foot - mainly the great toe.  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  These have been nonhealing.  Revascularization 6/10.  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      PT Comments    Pt was seen for mobility and strengthening with ROM to L knee as well.  Pt could handle a short sidestep with assist, using RW and cueing his control of R foot and ankle.  He is planning to DC to rehab unit today and so will see tomorrow if the transition is held for some reason.  Follow acutely for gait, ex and control of balance in all postures, as well as ongoing education about the process of prosthetic preparation.   Follow Up Recommendations  CIR     Equipment Recommendations  Wheelchair (measurements PT);Wheelchair cushion (measurements PT)    Recommendations for Other Services Rehab consult     Precautions / Restrictions Precautions Precautions: Fall Precaution Comments: recent left BKA Restrictions Weight Bearing Restrictions: Yes LLE Weight Bearing: Non weight bearing LLE Partial Weight Bearing Percentage or Pounds: BKA    Mobility  Bed Mobility Overal bed mobility: Modified Independent                Transfers Overall transfer level: Needs assistance Equipment used: Rolling walker (2 wheeled);1 person hand held assist Transfers: Sit to/from Stand Sit to Stand: Mod assist;From elevated surface         General transfer comment: pt requires repetition of instructions but could assist with all standing using bed  rail  Ambulation/Gait Ambulation/Gait assistance: Min assist Gait Distance (Feet): 2 Feet Assistive device: Rolling walker (2 wheeled);1 person hand held assist   Gait velocity: decreased Gait velocity interpretation: <1.8 ft/sec, indicate of risk for recurrent falls General Gait Details: side scooted with RW using R foot, minor shifting of balance on RLE with cues for direction and postural control   Stairs             Wheelchair Mobility    Modified Rankin (Stroke Patients Only)       Balance     Sitting balance-Leahy Scale: Good     Standing balance support: Bilateral upper extremity supported;During functional activity Standing balance-Leahy Scale: Poor Standing balance comment: RW and PT needed to control standing balance                            Cognition Arousal/Alertness: Awake/alert Behavior During Therapy: WFL for tasks assessed/performed Overall Cognitive Status: Within Functional Limits for tasks assessed                                        Exercises General Exercises - Lower Extremity Short Arc QuadSinclair Ship;Left;5 reps Heel Slides: AAROM;Left;5 reps(flexion of knee in sitting to increase ROM, attained 90+ deg)    General Comments General comments (skin integrity, edema, etc.): Pt has a very pale watery  drainage on bottom of L stump bandage      Pertinent Vitals/Pain Pain Assessment: Faces Faces Pain Scale: Hurts little more Pain Location: L leg pain post op Pain Descriptors / Indicators: Operative site guarding Pain Intervention(s): Limited activity within patient's tolerance;Premedicated before session;Monitored during session;Repositioned    Home Living                      Prior Function            PT Goals (current goals can now be found in the care plan section) Acute Rehab PT Goals Patient Stated Goal: To go home Progress towards PT goals: Progressing toward goals    Frequency    Min  5X/week      PT Plan Current plan remains appropriate    Co-evaluation              AM-PAC PT "6 Clicks" Mobility   Outcome Measure  Help needed turning from your back to your side while in a flat bed without using bedrails?: None Help needed moving from lying on your back to sitting on the side of a flat bed without using bedrails?: A Little Help needed moving to and from a bed to a chair (including a wheelchair)?: A Little Help needed standing up from a chair using your arms (e.g., wheelchair or bedside chair)?: A Lot Help needed to walk in hospital room?: A Lot Help needed climbing 3-5 steps with a railing? : Total 6 Click Score: 15    End of Session Equipment Utilized During Treatment: Gait belt Activity Tolerance: Patient limited by fatigue;Patient limited by pain Patient left: in bed;with call bell/phone within reach;with bed alarm set Nurse Communication: Mobility status PT Visit Diagnosis: Unsteadiness on feet (R26.81);Other abnormalities of gait and mobility (R26.89);Muscle weakness (generalized) (M62.81)     Time: 2409-7353 PT Time Calculation (min) (ACUTE ONLY): 29 min  Charges:  $Gait Training: 8-22 mins $Therapeutic Exercise: 8-22 mins                    Ramond Dial 09/24/2018, 2:08 PM  Mee Hives, PT MS Acute Rehab Dept. Number: [place] [facility] [place]

## 2018-09-24 NOTE — Progress Notes (Signed)
[facility]  Subjective  - No complaints.  PT/OT recommended CIR.   Objective (!) 119/51 82 98 F (36.7 C) (Oral) 15 100%  Intake/Output Summary (Last 24 hours) at 09/24/2018 0838 Last data filed at 09/24/2018 0644 Gross per 24 hour  Intake 540 ml  Output 500 ml  Net 40 ml    L BKA stump clean and dry - looks healthy - some bruising medially  Laboratory Lab Results: Recent Labs    09/23/18 0359 09/24/18 0446  WBC 17.4* 11.8*  HGB 7.9* 7.3*  HCT 24.1* 22.5*  PLT 512* 522*   BMET Recent Labs    09/23/18 0359 09/24/18 0446  NA 137 137  K 3.8 3.6  CL 103 105  CO2 27 24  GLUCOSE 211* 183*  BUN 11 12  CREATININE 0.83 0.90  CALCIUM 8.2* 8.2*    COAG Lab Results  Component Value Date   INR 1.1 09/08/2018   No results found for: PTT  Assessment/Planning: POD #2 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  Dressing changed today and BKA looks healthy.  Will give him 24 more hours of antibiotics since foot was infected.  PT/Ot recommend CIR.  He wants to further discuss with his son.  Pain well controlled.  Marty Heck 09/24/2018 8:38 AM --

## 2018-09-25 ENCOUNTER — Inpatient Hospital Stay (HOSPITAL_COMMUNITY): Payer: Medicare Other | Admitting: Occupational Therapy

## 2018-09-25 ENCOUNTER — Inpatient Hospital Stay (HOSPITAL_COMMUNITY): Payer: Medicare Other

## 2018-09-25 DIAGNOSIS — I10 Essential (primary) hypertension: Secondary | ICD-10-CM

## 2018-09-25 DIAGNOSIS — S88112S Complete traumatic amputation at level between knee and ankle, left lower leg, sequela: Secondary | ICD-10-CM

## 2018-09-25 DIAGNOSIS — E1142 Type 2 diabetes mellitus with diabetic polyneuropathy: Secondary | ICD-10-CM

## 2018-09-25 DIAGNOSIS — D62 Acute posthemorrhagic anemia: Secondary | ICD-10-CM

## 2018-09-25 DIAGNOSIS — M792 Neuralgia and neuritis, unspecified: Secondary | ICD-10-CM

## 2018-09-25 LAB — COMPREHENSIVE METABOLIC PANEL
ALT: 28 U/L (ref 0–44)
AST: 29 U/L (ref 15–41)
Albumin: 2.6 g/dL — ABNORMAL LOW (ref 3.5–5.0)
Alkaline Phosphatase: 62 U/L (ref 38–126)
Anion gap: 9 (ref 5–15)
BUN: 9 mg/dL (ref 8–23)
CO2: 25 mmol/L (ref 22–32)
Calcium: 8.4 mg/dL — ABNORMAL LOW (ref 8.9–10.3)
Chloride: 106 mmol/L (ref 98–111)
Creatinine, Ser: 0.81 mg/dL (ref 0.61–1.24)
GFR calc Af Amer: >60 mL/min (ref >60)
GFR calc non Af Amer: >60 mL/min (ref >60)
Glucose, Bld: 170 mg/dL — ABNORMAL HIGH (ref 70–99)
Potassium: 3.5 mmol/L (ref 3.5–5.1)
Sodium: 140 mmol/L (ref 135–145)
Total Bilirubin: 0.4 mg/dL (ref 0.3–1.2)
Total Protein: 6.3 g/dL — ABNORMAL LOW (ref 6.5–8.1)

## 2018-09-25 LAB — CBC WITH DIFFERENTIAL/PLATELET
Abs Immature Granulocytes: 0.09 10*3/uL — ABNORMAL HIGH (ref 0.00–0.07)
Basophils Absolute: 0.1 10*3/uL (ref 0.0–0.1)
Basophils Relative: 1 %
Eosinophils Absolute: 0.2 10*3/uL (ref 0.0–0.5)
Eosinophils Relative: 2 %
HCT: 24.8 % — ABNORMAL LOW (ref 39.0–52.0)
Hemoglobin: 7.9 g/dL — ABNORMAL LOW (ref 13.0–17.0)
Immature Granulocytes: 1 %
Lymphocytes Relative: 27 %
Lymphs Abs: 2.7 10*3/uL (ref 0.7–4.0)
MCH: 29.2 pg (ref 26.0–34.0)
MCHC: 31.9 g/dL (ref 30.0–36.0)
MCV: 91.5 fL (ref 80.0–100.0)
Monocytes Absolute: 1.1 10*3/uL — ABNORMAL HIGH (ref 0.1–1.0)
Monocytes Relative: 10 %
Neutro Abs: 6.1 10*3/uL (ref 1.7–7.7)
Neutrophils Relative %: 59 %
Platelets: 589 10*3/uL — ABNORMAL HIGH (ref 150–400)
RBC: 2.71 MIL/uL — ABNORMAL LOW (ref 4.22–5.81)
RDW: 15.2 % (ref 11.5–15.5)
WBC: 10.3 10*3/uL (ref 4.0–10.5)
nRBC: 0 % (ref 0.0–0.2)

## 2018-09-25 LAB — GLUCOSE, CAPILLARY
Glucose-Capillary: 130 mg/dL — ABNORMAL HIGH (ref 70–99)
Glucose-Capillary: 159 mg/dL — ABNORMAL HIGH (ref 70–99)
Glucose-Capillary: 162 mg/dL — ABNORMAL HIGH (ref 70–99)
Glucose-Capillary: 98 mg/dL (ref 70–99)

## 2018-09-25 MED ORDER — GABAPENTIN 100 MG PO CAPS
200.0000 mg | ORAL_CAPSULE | Freq: Three times a day (TID) | ORAL | Status: DC
  Administered 2018-09-25 – 2018-09-26 (×3): 200 mg via ORAL
  Filled 2018-09-25 (×3): qty 2

## 2018-09-25 MED ORDER — PRO-STAT SUGAR FREE PO LIQD
30.0000 mL | Freq: Two times a day (BID) | ORAL | Status: DC
  Administered 2018-09-26 – 2018-09-30 (×9): 30 mL via ORAL
  Filled 2018-09-25 (×10): qty 30

## 2018-09-25 NOTE — Progress Notes (Addendum)
09/24/2018 2100:  Patient complains to LPN of stinging and extreme discomfort in left leg. Patient states, "I know this is phantom pain but it will not go away". Patient is most comfortable laying laterally in bed with left leg hanging off of bed. Reinforced safety by encouraging laying in the bed correctly with leg in the bed. Utilized repositioning along with pillow wedge support and pain medication as comfort measures. Patient has continuous 8/10 pain before and after pain medication. LPN will continue to educate pt concerning safety measures and phantom pain management. Pt is resting with call bell at side. LPN will continue to monitor.

## 2018-09-25 NOTE — Progress Notes (Signed)
Inpatient Rehabilitation  Patient information reviewed and entered into eRehab system by Hera Celaya M. Krithik Mapel, M.A., CCC/SLP, PPS Coordinator.  Information including medical coding, functional ability and quality indicators will be reviewed and updated through discharge.    

## 2018-09-25 NOTE — Progress Notes (Signed)
[facility] PHYSICAL MEDICINE & REHABILITATION PROGRESS NOTE  Subjective/Complaints: Patient seen sitting up in bed this morning.  He states he slept well for the most part.  He is very hesitant to initiate therapies.  ROS: Denies CP, shortness of breath, nausea, vomiting, diarrhea.  Objective: Vital Signs: Blood pressure (!) 139/53, pulse 81, temperature 97.6 F (36.4 C), resp. rate 18, height 5\' 9"  (1.753 m), weight 81.9 kg, SpO2 97 %. No results found. Recent Labs    09/24/18 0446 09/25/18 0748  WBC 11.8* 10.3  HGB 7.3* 7.9*  HCT 22.5* 24.8*  PLT 522* 589*   Recent Labs    09/24/18 0446 09/25/18 0748  NA 137 140  K 3.6 3.5  CL 105 106  CO2 24 25  GLUCOSE 183* 170*  BUN 12 9  CREATININE 0.90 0.81  CALCIUM 8.2* 8.4*    Physical Exam: BP (!) 139/53 (BP Location: Left Arm)   Pulse 81   Temp 97.6 F (36.4 C)   Resp 18   Ht 5\' 9"  (1.753 m)   Wt 81.9 kg   SpO2 97%   BMI 26.67 kg/m  Constitutional: No distress . Vital signs reviewed. HENT: Normocephalic.  Atraumatic. Eyes: EOMI. No discharge. Cardiovascular: No JVD. Respiratory: Normal effort. GI: Non-distended. Musc: Left BKA with edema and tenderness  Neurological: He is alert.  Follows commands.   Motor: Bilateral upper extremities, right lower extremity: 5/5 proximal distal Left lower extremity: Limited due to pain, unchanged  Skin: BKA with serosanguineous drainage along lateral incision Psychiatric: Anxious  Assessment/Plan: 1. Functional deficits secondary to left BKA which require 3+ hours per day of interdisciplinary therapy in a comprehensive inpatient rehab setting.  Physiatrist is providing close team supervision and 24 hour management of active medical problems listed below.  Physiatrist and rehab team continue to assess barriers to discharge/monitor patient progress toward functional and medical goals  Care Tool:  Bathing              Bathing assist       Upper Body  Dressing/Undressing Upper body dressing        Upper body assist      Lower Body Dressing/Undressing Lower body dressing            Lower body assist       Toileting Toileting    Toileting assist Assist for toileting: Moderate Assistance - Patient 50 - 74%     Transfers Chair/bed transfer  Transfers assist     Chair/bed transfer assist level: Moderate Assistance - Patient 50 - 74%     Locomotion Ambulation   Ambulation assist              Walk 10 feet activity   Assist           Walk 50 feet activity   Assist           Walk 150 feet activity   Assist           Walk 10 feet on uneven surface  activity   Assist           Wheelchair     Assist               Wheelchair 50 feet with 2 turns activity    Assist            Wheelchair 150 feet activity     Assist            Medical Problem List and  Plan: 1.  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             Begin CIR evaluations 2.  Antithrombotics: -DVT/anticoagulation: Subcutaneous heparin             -antiplatelet therapy: Aspirin 81 mg daily, Plavix 75 mg daily 3. Pain Management:   Neurontin 100 mg 3 times daily, increase to 200 3 times daily on 6/18  Oxycodone as needed 4. Mood: Provide emotional support             -antipsychotic agents: N/A 5. Neuropsych: This patient is capable of making decisions on his own behalf. 6. Skin/Wound Care: Routine skin checks 7. Fluids/Electrolytes/Nutrition: Routine in and outs.   BMP within acceptable range, except for glucose on 6/18 8.  Diabetes mellitus with peripheral neuropathy.  Hemoglobin A1c 9.0.  Resume Glucophage 500 mg twice daily.   Monitor with increased mobility 9.  Hyperlipidemia.  Lipitor 10. Essential hypertension.  Patient on lisinopril 5 mg daily prior to admission.  Resume as needed.   Monitor with increased mobility 11.  Tobacco abuse.  Counseling 12.   Acute blood loss anemia  Hemoglobin 7.9 on 6/18  Continue to monitor 13.  Hypoalbuminemia  Supplement initiated on 6/18   LOS: 1 days A FACE TO FACE EVALUATION WAS PERFORMED  Thomas Garcia 09/25/2018, 9:39 AM

## 2018-09-25 NOTE — Progress Notes (Addendum)
09/24/2018 2030:  Please note that patient's primary problem on Epic is listed as L AKA. The patient has a L BKA. Contacted patient placement and attendant stated that they were unable to change the primary problem. The physician must change the primary problem. LPN will pass the message along to day shift nurse.
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
## 2018-09-25 NOTE — Plan of Care (Signed)
  Problem: Consults Goal: RH LIMB LOSS PATIENT EDUCATION Description: Description: See Patient Education module for eduction specifics. 09/25/2018 1446 by Adria Devon, LPN Outcome: Progressing 09/25/2018 1445 by Adria Devon, LPN Outcome: Progressing Goal: Skin Care Protocol Initiated - if Braden Score 18 or less Description: If consults are not indicated, leave blank or document N/A 09/25/2018 1446 by Adria Devon, LPN Outcome: Progressing 09/25/2018 1445 by Adria Devon, LPN Outcome: Progressing Goal: Diabetes Guidelines if Diabetic/Glucose > 140 Description: If diabetic or lab glucose is > 140 mg/dl - Initiate Diabetes/Hyperglycemia Guidelines & Document Interventions  09/25/2018 1446 by Adria Devon, LPN Outcome: Progressing 09/25/2018 1445 by Adria Devon, LPN Outcome: Progressing

## 2018-09-25 NOTE — Evaluation (Signed)
Occupational Therapy Assessment and Plan  Patient Details  Name: Thomas Garcia MRN: [id number] Date of Birth: 10-15-1940  OT Diagnosis: acute pain, muscle weakness (generalized) and swelling of limb Rehab Potential: Rehab Potential (ACUTE ONLY): Excellent ELOS: 7-10 days   Today's Date: 09/25/2018 OT Individual Time: 0930-1030 OT Individual Time Calculation (min): 60 min     Problem List:  Patient Active Problem List   Diagnosis Date Noted  . Unilateral complete BKA, left, sequela ([place])   . Acute blood loss anemia   . Essential hypertension   . Neuropathic pain   . Unilateral complete BKA, left, initial encounter ([place])   . Tobacco abuse   . Benign essential HTN   . Diabetic peripheral neuropathy ([place])   . Post-operative pain   . 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 PVD (peripheral vascular disease) ([place])   . 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 ([place]) 09/08/2018  . Osteomyelitis of second toe of left foot ([place]) 09/08/2018  . Cellulitis of left foot 09/08/2018  . Leukocytosis 09/08/2018  . Fever and chills 09/08/2018  . MOLE 10/26/2008  . 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 OVERWEIGHT 10/26/2008  . NICOTINE ADDICTION 10/26/2008  . ACUTE CYSTITIS 10/26/2008  . FATIGUE 10/26/2008  . COUGH 10/26/2008  . ELECTROCARDIOGRAM, ABNORMAL 10/26/2008    Past Medical History:  Past Medical History:  Diagnosis Date  . Diabetes mellitus without complication ([place])   . Kidney stones   . PONV (postoperative nausea and vomiting)    Past Surgical History:  Past Surgical History:  Procedure Laterality Date  . ABDOMINAL AORTOGRAM W/LOWER EXTREMITY N/A 09/17/2018   Procedure: ABDOMINAL AORTOGRAM W/LOWER EXTREMITY;  Surgeon: Marty Heck, MD;  Location: [facility];  Service: Cardiovascular;  Laterality: N/A;  . AMPUTATION Left 09/22/2018   Procedure: AMPUTATION BELOW KNEE LEFT;  Surgeon: Marty Heck, MD;  Location: [facility];  Service:  Vascular;  Laterality: Left;  . AMPUTATION TOE Left 09/10/2018   Procedure: AMPUTATION OF THIRD TOE LEFT FOOT, DEBRIDEMENT OF ULCERS ON SECOND TOE AND PARTIAL AMPUTATION SECOND TOE ON LEFT FOOT;  Surgeon: Virl Cagey, MD;  Location: [facility];  Service: General;  Laterality: Left;  . APPLICATION OF WOUND VAC Left 09/18/2018   Procedure: APPLICATION OF WOUND VAC;  Surgeon: Marty Heck, MD;  Location: [facility];  Service: Vascular;  Laterality: Left;  . CYSTOSCOPY  05/08/2012   Procedure: CYSTOSCOPY;  Surgeon: Marissa Nestle, MD;  Location: [facility];  Service: Urology;  Laterality: N/A;  Evacuation of clots  . KIDNEY SURGERY     sutures   . PERIPHERAL VASCULAR BALLOON ANGIOPLASTY  09/17/2018   Procedure: PERIPHERAL VASCULAR BALLOON ANGIOPLASTY;  Surgeon: Marty Heck, MD;  Location: [facility];  Service: Cardiovascular;;  LT SFA and AT  . SPLENECTOMY    . TRANSMETATARSAL AMPUTATION Left 09/18/2018   Procedure: TRANSMETATARSAL AMPUTATION LEFT FOOT;  Surgeon: Marty Heck, MD;  Location: [facility];  Service: Vascular;  Laterality: Left;  . TRANSURETHRAL RESECTION OF PROSTATE  05/14/2012   Procedure: TRANSURETHRAL RESECTION OF THE PROSTATE (TURP);  Surgeon: Marissa Nestle, MD;  Location: [facility];  Service: Urology;  Laterality: N/A;    Assessment & Plan Clinical Impression:  Thomas Bel[place]. Garcia is a 78 year old right-handed male with history of diabetes mellitus, tobacco abuse, peripheral vascular disease. Per chart review and patient, patient lives alone with assistance of his brother for community ADLs. Mobile home and  son plans to build a ramp. Independent household ambulator prior to admission. Presented 09/17/2018 with ischemic left lower extremity and recent amputation of left third toe partial amputation of left second toe 09/10/2018 and was discharged to home 09/11/2018 with home health therapies. Aortogram completed of left lower extremity showing aortoiliac segment very  calcified and a very large posterior plaque in the distal external iliac common femoral artery. The SFA was diffusely diseased and several segments of chronic total occlusion. No change with conservative care. 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 Patient on vancomycin x48 hours postoperatively. Hospital course further complicated by postoperative pain as well as acute blood loss anemia. Subcutaneous heparin for DVT prophylaxis. Therapy evaluations completed and patient was admitted for a comprehensive rehab program  Patient transferred to CIR on 09/24/2018 .    Patient currently requires min with basic self-care skills secondary to muscle weakness, decreased cardiorespiratoy endurance and decreased standing balance and decreased balance strategies.  Prior to hospitalization, patient could complete BADL with modified independent .  Patient will benefit from skilled intervention to increase independence with basic self-care skills and increase level of independence with iADL prior to discharge home independently.  Anticipate patient will require intermittent supervision and follow up home health.  OT - End of Session Activity Tolerance: Tolerates 10 - 20 min activity with multiple rests Endurance Deficit: Yes Endurance Deficit Description: due to pain OT Assessment Rehab Potential (ACUTE ONLY): Excellent OT Patient demonstrates impairments in the following area(s): Balance;Endurance;Pain;Sensory OT Basic ADL's Functional Problem(s): Bathing;Dressing;Toileting OT Advanced ADL's Functional Problem(s): Simple Meal Preparation OT Transfers Functional Problem(s): Toilet;Tub/Shower OT Additional Impairment(s): None OT Plan OT Intensity: Minimum of 1-2 x/day, 45 to 90 minutes OT Frequency: 5 out of 7 days OT Duration/Estimated Length of Stay: 7-10 days OT Treatment/Interventions: Balance/vestibular  training;Discharge planning;DME/adaptive equipment instruction;Functional mobility training;Pain management;Psychosocial support;Patient/family education;Self Care/advanced ADL retraining;Therapeutic Activities;Therapeutic Exercise;UE/LE Strength taining/ROM OT Self Feeding Anticipated Outcome(s): no goal, pt is Independent OT Basic Self-Care Anticipated Outcome(s): Mod I OT Toileting Anticipated Outcome(s): Mod I OT Bathroom Transfers Anticipated Outcome(s): Mod I OT Recommendation Patient destination: Home Follow Up Recommendations: Home health OT Equipment Recommended: Tub/shower bench;3 in 1 bedside comode   Skilled Therapeutic Intervention Pt seen for initial evaluation and ADL training with a focus on mobility skills. Explained role of OT and discussed pt's goals.  Pt initially very apprehensive about having a male OT or caregiver, but with reassurance that he would be given as much privacy as possible pt relaxed and at end of session he stated he was now ok with male caregivers.  Pt received in wc and completed stand pivot transfer with grab bars and min A to elevated BSC over toilet.  From there he was able to toilet and bathe. He did need some assist with R foot and A to balance in standing to wash bottom.  He transferred to w/c to dress. A to stabilize stand balance as pt pulled pants over hips with min a.   A to don sock.   Pt completed grooming at the sink.    Pt tolerated session well even though he was in a lot of pain. Pt resting in wc with belt alarm on and all needs met.   OT Evaluation Precautions/Restrictions  Precautions Precautions: Fall Restrictions LLE Weight Bearing: Non weight bearing   Pain Pain Assessment Pain Score: 7  Pain Type: Acute pain;Surgical pain;Phantom pain Pain Location: Leg Pain  Orientation: Left Pain Descriptors / Indicators: Cramping Pain Onset: On-going Pain Intervention(s): Rest;Repositioned;Emotional support Home Living/Prior  Functioning Home Living Family/patient expects to be discharged to:: Private residence Living Arrangements: Alone Available Help at Discharge: Family, Available PRN/intermittently Type of Home: Mobile home Home Access: Stairs to enter Entrance Stairs-Number of Steps: 3 Entrance Stairs-Rails: Right, Left, Can reach both Home Layout: One level Bathroom Shower/Tub: Government social research officer Accessibility: Yes Additional Comments: son plans to build ramp  Lives With: Alone Prior Function Level of Independence: Requires assistive device for independence, Needs assistance with homemaking, Independent with basic ADLs, Independent with transfers Driving: Yes Vocation: Retired Comments: Thinks his son will build him a ramp ADL  min A overall  Vision Baseline Vision/History: Wears glasses Patient Visual Report: Blurring of vision Vision Assessment?: No apparent visual deficits Additional Comments: Pt did not want to wear glasses, per pt he could see well enough.  Did not formally test vision today. Perception  Perception: Within Functional Limits Praxis Praxis: Intact Cognition Overall Cognitive Status: Within Functional Limits for tasks assessed Arousal/Alertness: Awake/alert Orientation Level: Person;Place;Situation Person: Oriented Place: Oriented Situation: Oriented Year: 2020 Month: June Day of Week: Correct Memory: Appears intact Immediate Memory Recall: Sock;Blue;Bed Memory Recall: Sock;Blue;Bed Memory Recall Sock: Without Cue Memory Recall Blue: Without Cue Memory Recall Bed: Without Cue Awareness: Appears intact Problem Solving: Appears intact Safety/Judgment: Appears intact Sensation Sensation Peripheral sensation comments: great toe partially numb ; bil finger tips somewhat numb/parasthesias Light Touch Impaired Details: Impaired RUE;Impaired LUE;Absent RLE Hot/Cold: Appears Intact Proprioception: Appears Intact Stereognosis: Not  tested Coordination Gross Motor Movements are Fluid and Coordinated: Yes Fine Motor Movements are Fluid and Coordinated: Yes Motor   generalized muscle weakness Mobility    min A stand pivot transfers Trunk/Postural Assessment  Cervical Assessment Cervical Assessment: Within Functional Limits Thoracic Assessment Thoracic Assessment: Within Functional Limits Lumbar Assessment Lumbar Assessment: Within Functional Limits Postural Control Postural Control: Within Functional Limits  Balance Dynamic Sitting Balance Dynamic Sitting - Level of Assistance: 7: Independent Static Standing Balance Static Standing - Level of Assistance: 4: Min assist(with 1 hand on a support) Dynamic Standing Balance Dynamic Standing - Level of Assistance: 3: Mod assist(with 1 hand on a support) Extremity/Trunk Assessment RUE Assessment RUE Assessment: Within Functional Limits LUE Assessment LUE Assessment: Within Functional Limits     Refer to Care Plan for Long Term Goals  Recommendations for other services: None    Discharge Criteria: Patient will be discharged from OT if patient refuses treatment 3 consecutive times without medical reason, if treatment goals not met, if there is a change in medical status, if patient makes no progress towards goals or if patient is discharged from hospital.  The above assessment, treatment plan, treatment alternatives and goals were discussed and mutually agreed upon: by patient  [facility] 09/25/2018, 12:57 PM

## 2018-09-25 NOTE — Plan of Care (Signed)
  Problem: Consults Goal: RH LIMB LOSS PATIENT EDUCATION Description: Description: See Patient Education module for eduction specifics. Outcome: Progressing Goal: Skin Care Protocol Initiated - if Braden Score 18 or less Description: If consults are not indicated, leave blank or document N/A Outcome: Progressing Goal: Diabetes Guidelines if Diabetic/Glucose > 140 Description: If diabetic or lab glucose is > 140 mg/dl - Initiate Diabetes/Hyperglycemia Guidelines & Document Interventions  Outcome: Progressing

## 2018-09-25 NOTE — Progress Notes (Signed)
Occupational Therapy Session Note  Patient Details  Name: Thomas Garcia MRN: [id number] Date of Birth: [birth year]  Today's Date: 09/25/2018 OT Individual Time: 7893-8101 OT Individual Time Calculation (min): 73 min    Short Term Goals: Week 1:  OT Short Term Goal 1 (Week 1): Pt will complete toilet transfers with S using RW. OT Short Term Goal 2 (Week 1): Pt will complete tub bench transfers with S using Rw. OT Short Term Goal 3 (Week 1): Pt will complete LB dressing with S.  Skilled Therapeutic Interventions/Progress Updates:  1:1. Pt received on toilet. Pt completes CM/transfer after toileting with grab bar and CGA. Pt requires increased time for voiding. Pt completes washing hands at sink and propwls w/c to tx space with supervision. Pt completes squat pivot transfer with CGA and VC for hand placement. Pt completes mirror tx for phantom sensation and pain normalization with mod VC for attention to image in mirror. Pt and OT discuss experience of dietary services and medications the first night as pt was frustrated. Ot educates on eating for energy and pt agrees to eat saltines. Pt  Completes 2x10 w/c push ups with VC for slow controlled decent. Exited session with pt seated in w/c, call light in reach and all needs et  Therapy Documentation Precautions:  Precautions Precautions: Fall Restrictions Weight Bearing Restrictions: Yes LLE Weight Bearing: Non weight bearing LLE Partial Weight Bearing Percentage or Pounds: BKA General:   Vital Signs: Therapy Vitals Temp: 98.9 F (37.2 C) Temp Source: Oral Pulse Rate: 88 Resp: 18 BP: (!) 121/55 Patient Position (if appropriate): Sitting Oxygen Therapy SpO2: 100 % O2 Device: Room Air Pain: Pain Assessment Pain Score: 7  Pain Type: Acute pain;Surgical pain;Phantom pain Pain Location: Leg Pain Orientation: Left Pain Descriptors / Indicators: Cramping Pain Onset: On-going Pain Intervention(s): Rest;Repositioned;Emotional  support ADL:   Vision Baseline Vision/History: Wears glasses Patient Visual Report: Blurring of vision Vision Assessment?: No apparent visual deficits Additional Comments: Pt did not want to wear glasses, per pt he could see well enough.  Did not formally test vision today. Perception  Perception: Within Functional Limits Praxis Praxis: Intact Exercises:   Other Treatments:     Therapy/Group: Individual Therapy  Tonny Branch 09/25/2018, 2:21 PM

## 2018-09-26 ENCOUNTER — Inpatient Hospital Stay (HOSPITAL_COMMUNITY): Payer: Medicare Other | Admitting: Occupational Therapy

## 2018-09-26 ENCOUNTER — Inpatient Hospital Stay (HOSPITAL_COMMUNITY): Payer: Medicare Other

## 2018-09-26 DIAGNOSIS — G8918 Other acute postprocedural pain: Secondary | ICD-10-CM

## 2018-09-26 DIAGNOSIS — G546 Phantom limb syndrome with pain: Secondary | ICD-10-CM

## 2018-09-26 LAB — GLUCOSE, CAPILLARY
Glucose-Capillary: 114 mg/dL — ABNORMAL HIGH (ref 70–99)
Glucose-Capillary: 171 mg/dL — ABNORMAL HIGH (ref 70–99)
Glucose-Capillary: 196 mg/dL — ABNORMAL HIGH (ref 70–99)
Glucose-Capillary: 170 mg/dL — ABNORMAL HIGH (ref 70–99)

## 2018-09-26 MED ORDER — GABAPENTIN 400 MG PO CAPS
400.0000 mg | ORAL_CAPSULE | Freq: Three times a day (TID) | ORAL | Status: DC
  Administered 2018-09-26 – 2018-09-30 (×12): 400 mg via ORAL
  Filled 2018-09-26 (×12): qty 1

## 2018-09-26 NOTE — Progress Notes (Addendum)
Occupational Therapy Session Note  Patient Details  Name: Thomas Garcia MRN: [id number] Date of Birth: Sep 14, 1940  Today's Date: 09/26/2018 OT Individual Time: 7903-8333 OT Individual Time Calculation (min): 60 min    Short Term Goals: Week 1:  OT Short Term Goal 1 (Week 1): Pt will complete toilet transfers with S using RW. OT Short Term Goal 2 (Week 1): Pt will complete tub bench transfers with S using Rw. OT Short Term Goal 3 (Week 1): Pt will complete LB dressing with S.  Skilled Therapeutic Interventions/Progress Updates:    Pt received in w/c c/o intense L limb pain.  Pt initially declining therapy stating he was in too much pain and stated he did not choose to come to rehab, but that he was "told" he needed to come.  Discussed the benefits of rehab and the dangers of going .  With encouragement pt agreed to participate.  Pt taken to gym and met another pt (socially distanced with face mask) who had the same dx. That pt encouraged [name] to work hard in rehab.   Pt taken to gym. He was unwilling to transfer to mat and just wanted to work from the w/c.  Pt set up with mirror for mirror therapy. Worked on that for 20 + min with frequent cues as he kept closing his eyes and dozing off. 3 lb dowel bar exercises with chest and shoulder presses.  Pt taken back to room but he opted to stay in wc.  Belt alarm on and all needs met.  Therapy Documentation Precautions:  Precautions Precautions: Fall Restrictions Weight Bearing Restrictions: Yes LLE Weight Bearing: Non weight bearing LLE Partial Weight Bearing Percentage or Pounds: BKA     Pain: Pain Assessment Pain Scale: 0-10 Pain Score: 10-Worst pain ever Faces Pain Scale: Hurts worst Pain Type: Acute pain;Surgical pain Pain Location: Leg Pain Orientation: Left Multiple Pain Sites: No  (RN aware - medication provided)      Therapy/Group: Individual Therapy  [place] 09/26/2018, 12:06 PM

## 2018-09-26 NOTE — Progress Notes (Signed)
[facility] PHYSICAL MEDICINE & REHABILITATION PROGRESS NOTE  Subjective/Complaints: Patient seen laying in bed this morning.  He states he did not sleep well overnight because he needs to sleep in a particular position with his legs hanging off the bed and he was unable to do that comfortably overnight.  He states yesterday was not a good first day with therapies either because he did not really feel like participating.  He states that he probably want feel like participating today either.  ROS: Denies CP, shortness of breath, nausea, vomiting, diarrhea.  Objective: Vital Signs: Blood pressure (!) 142/64, pulse 87, temperature 98.4 F (36.9 C), temperature source Oral, resp. rate 16, height 5\' 9"  (1.753 m), weight 81.9 kg, SpO2 98 %. No results found. Recent Labs    09/24/18 0446 09/25/18 0748  WBC 11.8* 10.3  HGB 7.3* 7.9*  HCT 22.5* 24.8*  PLT 522* 589*   Recent Labs    09/24/18 0446 09/25/18 0748  NA 137 140  K 3.6 3.5  CL 105 106  CO2 24 25  GLUCOSE 183* 170*  BUN 12 9  CREATININE 0.90 0.81  CALCIUM 8.2* 8.4*    Physical Exam: BP (!) 142/64 (BP Location: Left Arm)   Pulse 87   Temp 98.4 F (36.9 C) (Oral)   Resp 16   Ht 5\' 9"  (1.753 m)   Wt 81.9 kg   SpO2 98%   BMI 26.67 kg/m  Constitutional: No distress . Vital signs reviewed. HENT: Normocephalic.  Atraumatic. Eyes: EOMI.  No discharge. Cardiovascular: No JVD. Respiratory: Normal effort. GI: Non-distended. Musc: Left BKA with edema and tenderness, stable Neurological: He is alert.  Follows commands.   Motor: Bilateral upper extremities, right lower extremity: 5/5 proximal to distal Left lower extremity: >/3/5 hip flexion, limited due to pain  Skin: BKA with serosanguineous drainage along lateral incision Psychiatric: Anxious with altered affect  Assessment/Plan: 1. Functional deficits secondary to left BKA which require 3+ hours per day of interdisciplinary therapy in a comprehensive inpatient rehab  setting.  Physiatrist is providing close team supervision and 24 hour management of active medical problems listed below.  Physiatrist and rehab team continue to assess barriers to discharge/monitor patient progress toward functional and medical goals  Care Tool:  Bathing    Body parts bathed by patient: Right arm, Left arm, Chest, Abdomen, Front perineal area, Buttocks, Right upper leg, Left upper leg, Face   Body parts bathed by helper: Right lower leg Body parts n/a: Left lower leg   Bathing assist Assist Level: Minimal Assistance - Patient > 75%     Upper Body Dressing/Undressing Upper body dressing   What is the patient wearing?: Pull over shirt    Upper body assist Assist Level: Set up assist    Lower Body Dressing/Undressing Lower body dressing      What is the patient wearing?: Pants     Lower body assist Assist for lower body dressing: Moderate Assistance - Patient 50 - 74%     Toileting Toileting    Toileting assist Assist for toileting: Minimal Assistance - Patient > 75%     Transfers Chair/bed transfer  Transfers assist     Chair/bed transfer assist level: Minimal Assistance - Patient > 75%     Locomotion Ambulation   Ambulation assist      Assist level: Minimal Assistance - Patient > 75% Assistive device: Parallel bars Max distance: 3   Walk 10 feet activity   Assist  Walk 10 feet activity did  not occur: Safety/medical concerns(pain)        Walk 50 feet activity   Assist Walk 50 feet with 2 turns activity did not occur: (pain)         Walk 150 feet activity   Assist Walk 150 feet activity did not occur: Safety/medical concerns(activity tolerance)         Walk 10 feet on uneven surface  activity   Assist Walk 10 feet on uneven surfaces activity did not occur: Safety/medical concerns(pain, decreased balance)         Wheelchair     Assist Will patient use wheelchair at discharge?: Yes Type of Wheelchair:  Manual    Wheelchair assist level: Supervision/Verbal cueing Max wheelchair distance: 150    Wheelchair 50 feet with 2 turns activity    Assist        Assist Level: Supervision/Verbal cueing   Wheelchair 150 feet activity     Assist     Assist Level: Supervision/Verbal cueing      Medical Problem List and Plan: 1.  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             Continue CIR 2.  Antithrombotics: -DVT/anticoagulation: Subcutaneous heparin             -antiplatelet therapy: Aspirin 81 mg daily, Plavix 75 mg daily 3. Pain Management/phantom limb pain:   Neurontin 100 mg 3 times daily, increase to 200 3 times daily on 6/18.  400 3 times daily on 6/19  Oxycodone as needed 4. Mood: Provide emotional support             -antipsychotic agents: N/A 5. Neuropsych: This patient is capable of making decisions on his own behalf. 6. Skin/Wound Care: Routine skin checks 7. Fluids/Electrolytes/Nutrition: Routine in and outs.   BMP within acceptable range, except for glucose on 6/18 8.  Diabetes mellitus with peripheral neuropathy.  Hemoglobin A1c 9.0.  Resume Glucophage 500 mg twice daily.   Labile on 6/19, monitor for trend  Monitor with increased mobility 9.  Hyperlipidemia.  Lipitor 10. Essential hypertension.  Patient on lisinopril 5 mg daily prior to admission.  Resume as needed.   Relatively controlled on 6/19  Monitor with increased mobility 11.  Tobacco abuse.  Counseling 12.  Acute blood loss anemia  Hemoglobin 7.9 on 6/18  Continue to monitor 13.  Hypoalbuminemia  Supplement initiated on 6/18   LOS: 2 days A FACE TO FACE EVALUATION WAS PERFORMED  Thomas Garcia Lorie Phenix 09/26/2018, 12:23 PM

## 2018-09-26 NOTE — Progress Notes (Addendum)
Physical Therapy Session Note  Patient Details  Name: Thomas Garcia MRN: [id number] Date of Birth: 1940-08-12  Today's Date: 09/26/2018 PT Individual Time: 0800-0900 PT Individual Time Calculation (min): 60 min   Short Term Goals: Week 1:  PT Short Term Goal 1 (Week 1): = LTGs due to ELOS  Skilled Therapeutic Interventions/Progress Updates:     Patient in bed upon PT arrival. Patient alert and agreeable to PT session. Reported 8.5/10 pain in his residual limb with phantom pain and that he was pre-medicated prior to session. PT provided education about phantom pain and relaxation techniques. Patient stated he was more comfortable sitting EOB than lying in the bed and was assisted to sitting EOB during education.   Therapeutic Activity: Bed Mobility: Patient performed supine to/from sit with min A for L LE managment with HOB elevated. Provided verbal cues for using UEs to scoot to the EOB while holding his residual limb off the bed. Patient sat EOB during pain management education for 10 minutes with supervision.  Transfers: Patient performed sit to/from stand x1 with the RW, a toilet transfer without RW and use of grab bars, and a squat pivot transfer x1 without AD with CGA-min A. Provided verbal cues for hand placement and use of RW and grab bars. He had a successful BM while on the toilet and required supervision for peri-care with total A for follow up by therapist. He donned paper scrub pants with mod A during sit to stand transfer.   Wheelchair Mobility:  Patient required max-mod cues for donning/doffing of leg rest and limb pad and use of breaks throughout session.  Patient in w/c at end of session with breaks locked, seat belt alarm set, and all needs within reach.    Therapy Documentation Precautions:  Precautions Precautions: Fall Restrictions Weight Bearing Restrictions: Yes LLE Weight Bearing: Non weight bearing LLE Partial Weight Bearing Percentage or Pounds:  BKA Pain: Pain Assessment Pain Scale: 0-10 Pain Score: 8 Pain Type: Acute pain;Surgical pain;phantom pain Pain Location: Leg Pain Orientation: Left Pin Intervention: Repositioned;Distraction;Relaxation    Therapy/Group: Individual Therapy  Jameria Bradway L Jhania Etherington PT, DPT  09/26/2018, 11:17 AM

## 2018-09-26 NOTE — Progress Notes (Signed)
Occupational Therapy Session Note  Patient Details  Name: Thomas Garcia MRN: [id number] Date of Birth: 21-Sep-1940  Today's Date: 09/26/2018 OT Individual Time: 1164-3539 OT Individual Time Calculation (min): 60 min   Short Term Goals: Week 1:  OT Short Term Goal 1 (Week 1): Pt will complete toilet transfers with S using RW. OT Short Term Goal 2 (Week 1): Pt will complete tub bench transfers with S using Rw. OT Short Term Goal 3 (Week 1): Pt will complete LB dressing with S.  Skilled Therapeutic Interventions/Progress Updates:    Pt greeted sitting in wc, pt reports R LE uncomfortable and wanted to know if his leg rest could lift up. OT exchanged regular leg rest for elevating leg rest to improve pt tolerance seated in wc. With encouragement, pt agreeable to UB there-ex. Pt propelled wc to therapy gym with 1 rest break. Pt completed 5 mins x 3 on [employer] with extended rest breaks in between sets. OT incorporated music therapy with patient preferred 70's radio station. Pt really enjoyed music and was more willing to participate as much as he could. Pt completed L BKA there-ex including knee extension, hip extension, hip adduction/abduction, and glute squeezes. 3 sets of 10.Pt reported max fatigue, but agreeable to propel wc back to room with supervision. Pt left seated in wc with safety belt on and needs met.   Therapy Documentation Precautions:  Precautions Precautions: Fall Restrictions Weight Bearing Restrictions: Yes LLE Weight Bearing: Non weight bearing LLE Partial Weight Bearing Percentage or Pounds: BKA General: General OT Amount of Missed Time: 15 Minutes Pain:  reports pain is well managed at the time of therapy, would not state a number  Therapy/Group: Individual Therapy  Valma Cava 09/26/2018, 3:30 PM

## 2018-09-26 NOTE — Plan of Care (Signed)
  Problem: Consults Goal: RH LIMB LOSS PATIENT EDUCATION Description: Description: See Patient Education module for eduction specifics. Outcome: Progressing Goal: Skin Care Protocol Initiated - if Braden Score 18 or less Description: If consults are not indicated, leave blank or document N/A Outcome: Progressing Goal: Diabetes Guidelines if Diabetic/Glucose > 140 Description: If diabetic or lab glucose is > 140 mg/dl - Initiate Diabetes/Hyperglycemia Guidelines & Document Interventions  Outcome: Progressing   Problem: RH BOWEL ELIMINATION Goal: RH STG MANAGE BOWEL WITH ASSISTANCE Description: STG Manage Bowel with Min Assistance. Outcome: Progressing Goal: RH STG MANAGE BOWEL W/MEDICATION W/ASSISTANCE Description: STG Manage Bowel with Medication with [place]. Outcome: Progressing   Problem: RH SKIN INTEGRITY Goal: RH STG SKIN FREE OF INFECTION/BREAKDOWN Description: Patients skin will remain free from further infection or breakdown with min assist. Outcome: Progressing Goal: RH STG MAINTAIN SKIN INTEGRITY WITH ASSISTANCE Description: STG Maintain Skin Integrity With min Assistance. Outcome: Progressing Goal: RH STG ABLE TO PERFORM INCISION/WOUND CARE W/ASSISTANCE Description: STG Able To Perform Incision/Wound Care With Assistance. Outcome: Progressing   Problem: RH SAFETY Goal: RH STG ADHERE TO SAFETY PRECAUTIONS W/ASSISTANCE/DEVICE Description: STG Adhere to Safety Precautions With mod Assistance/Device. Outcome: Progressing   Problem: RH PAIN MANAGEMENT Goal: RH STG PAIN MANAGED AT OR BELOW PT'S PAIN GOAL Description: < 4 Outcome: Progressing   Problem: RH KNOWLEDGE DEFICIT LIMB LOSS Goal: RH STG INCREASE KNOWLEDGE OF SELF CARE AFTER LIMB LOSS Description: With min assist. Outcome: Progressing   Problem: RH BOWEL ELIMINATION Goal: RH STG MANAGE BOWEL W/MEDICATION W/ASSISTANCE Description: STG Manage Bowel with Medication with Assistance. Outcome:  Progressing

## 2018-09-27 ENCOUNTER — Inpatient Hospital Stay (HOSPITAL_COMMUNITY): Payer: Medicare Other

## 2018-09-27 LAB — GLUCOSE, CAPILLARY
Glucose-Capillary: 168 mg/dL — ABNORMAL HIGH (ref 70–99)
Glucose-Capillary: 139 mg/dL — ABNORMAL HIGH (ref 70–99)
Glucose-Capillary: 152 mg/dL — ABNORMAL HIGH (ref 70–99)
Glucose-Capillary: 173 mg/dL — ABNORMAL HIGH (ref 70–99)

## 2018-09-27 NOTE — Progress Notes (Signed)
Physical Therapy Session Note  Patient Details  Name: Thomas Garcia MRN: [id number] Date of Birth: October 03, 1940  Today's Date: 09/27/2018 PT Individual Time: 0815-1000 and 1435-1355 PT Individual Time Calculation (min): 105 min and 20 min  Short Term Goals: Week 1:  PT Short Term Goal 1 (Week 1): = LTGs due to ELOS  Skilled Therapeutic Interventions/Progress Updates:     Session 1: Patient in w/c upon PT arrival. PT arrived early for session due to other patient cancellation, patient was alert and agreeable to PT session. He asked if he could do all of his therapy this morning to have a break this afternoon. PT agreed to continue into his scheduled time until 1000 and to come back for a short session this afternoon for his remaining time. He agreed and requested to use the restroom prior to leaving the room.   Therapeutic Activity: Bed Mobility: Patient performed supine to/from sit with supervision with bed flat without rails. Transfers: Patient performed a toilet transfer with CGA for safety and balance using B garb bars. He performed sit to/from stand x1 and a car transfer with min A using a RW with cues for safe use of RW and reaching back to sit. He performed squat pivot transfers to/from the bed with CGA and cues for set up.  Wheelchair Mobility:  Patient propelled wheelchair 150 feet and 100 feet with supervision. Provided verbal cues for turning technique and donning doffing leg rests and use of breaks. Demonstrated improvement with use of leg rest and breaks using teach-back method throughout session.   Therapeutic Exercise: Patient performed the following exercises with verbal and tactile cues for proper technique. -Thomas quad sets x10 with 5 sec hold -B glut sets x10 with 5 sec hold -Thomas SLR x10 -B isometric hip adduction with towel roll x10 with 5 sec hold -R bridging x10  Patient sitting EOB with RN at bedside at end of session with breaks locked, bed alarm set, and all needs  within reach.   Session 2:  Patient in w/c upon PT arrival. Patient alert and agreeable to PT session.  Therapeutic Activity: Transfers: Patient performed sit to/from stand x1 with CGA using the RW. Provided verbal cues for reaching back to sit.  Gait Training:  Patient ambulated 15 feet using RW with CGA with w/c follow. Ambulated with hop-to gait pattern on R with decreased gait speed, increased impact on initial contact, and decreased step length. Provided verbal cues for sequencing, controlling initial contact with UEs, and landing between his hands inside the RW.  Wheelchair Mobility:  Patient propelled wheelchair 20 feet x2 and with in the room and don/doffed the leg rest and demonstrated correct use of breaks independently throughout session. PT cleared patient for w/c mobility within the room indpendently, but no transfers, during session. Patient was educated on calling for assistance to transfer into and out of the w/c and agreed that he would do so, RN made aware.   Patient in w/c at end of session with breaks locked and all needs within reach. Provided patient a piece of paper to draw our bathroom set up, as he stated he would need to ambulate into the bathroom at home.   Therapy Documentation Precautions:  Precautions Precautions: Fall Restrictions Weight Bearing Restrictions: Yes LLE Weight Bearing: Non weight bearing LLE Partial Weight Bearing Percentage or Pounds: BKA Pain: Patient had no complaints of pain throughout sessions.    Therapy/Group: Individual Therapy  Thomas Garcia Thomas Garcia PT, DPT  09/27/2018, 3:42 PM

## 2018-09-27 NOTE — Progress Notes (Signed)
[facility] PHYSICAL MEDICINE & REHABILITATION PROGRESS NOTE  Subjective/Complaints: Seen in OT.  Working on bathroom transfers No phantom pain still has stump pain.  ROS: Denies CP, shortness of breath, nausea, vomiting, diarrhea.  Objective: Vital Signs: Blood pressure 132/73, pulse 80, temperature 97.9 F (36.6 C), temperature source Oral, resp. rate 18, height 5\' 9"  (1.753 m), weight 81.9 kg, SpO2 98 %. No results found. Recent Labs    09/25/18 0748  WBC 10.3  HGB 7.9*  HCT 24.8*  PLT 589*   Recent Labs    09/25/18 0748  NA 140  K 3.5  CL 106  CO2 25  GLUCOSE 170*  BUN 9  CREATININE 0.81  CALCIUM 8.4*    Physical Exam: BP 132/73 (BP Location: Left Arm)   Pulse 80   Temp 97.9 F (36.6 C) (Oral)   Resp 18   Ht 5\' 9"  (1.753 m)   Wt 81.9 kg   SpO2 98%   BMI 26.67 kg/m  Constitutional: No distress . Vital signs reviewed. HENT: Normocephalic.  Atraumatic. Eyes: EOMI.  No discharge. Cardiovascular: No JVD. Respiratory: Normal effort. GI: Non-distended. Musc: Left BKA with edema and tenderness, stable Neurological: He is alert.  Follows commands.   Motor: Bilateral upper extremities, right lower extremity: 5/5 proximal to distal Left lower extremity: >/3/5 hip flexion, limited due to pain  Skin: BKA with serosanguineous drainage along lateral incision Psychiatric: Anxious with altered affect  Assessment/Plan: 1. Functional deficits secondary to left BKA which require 3+ hours per day of interdisciplinary therapy in a comprehensive inpatient rehab setting.  Physiatrist is providing close team supervision and 24 hour management of active medical problems listed below.  Physiatrist and rehab team continue to assess barriers to discharge/monitor patient progress toward functional and medical goals  Care Tool:  Bathing    Body parts bathed by patient: Right arm, Left arm, Chest, Abdomen, Front perineal area, Buttocks, Right upper leg, Left upper leg, Face    Body parts bathed by helper: Right lower leg Body parts n/a: Left lower leg   Bathing assist Assist Level: Minimal Assistance - Patient > 75%     Upper Body Dressing/Undressing Upper body dressing   What is the patient wearing?: Pull over shirt    Upper body assist Assist Level: Contact Guard/Touching assist    Lower Body Dressing/Undressing Lower body dressing      What is the patient wearing?: Pants     Lower body assist Assist for lower body dressing: Moderate Assistance - Patient 50 - 74%     Toileting Toileting    Toileting assist Assist for toileting: Contact Guard/Touching assist     Transfers Chair/bed transfer  Transfers assist     Chair/bed transfer assist level: Minimal Assistance - Patient > 75%     Locomotion Ambulation   Ambulation assist      Assist level: Minimal Assistance - Patient > 75% Assistive device: Parallel bars Max distance: 3   Walk 10 feet activity   Assist  Walk 10 feet activity did not occur: Safety/medical concerns(pain)        Walk 50 feet activity   Assist Walk 50 feet with 2 turns activity did not occur: (pain)         Walk 150 feet activity   Assist Walk 150 feet activity did not occur: Safety/medical concerns(activity tolerance)         Walk 10 feet on uneven surface  activity   Assist Walk 10 feet on uneven surfaces activity  did not occur: Safety/medical concerns(pain, decreased balance)         Wheelchair     Assist Will patient use wheelchair at discharge?: Yes Type of Wheelchair: Manual    Wheelchair assist level: Supervision/Verbal cueing Max wheelchair distance: 150    Wheelchair 50 feet with 2 turns activity    Assist        Assist Level: Supervision/Verbal cueing   Wheelchair 150 feet activity     Assist     Assist Level: Supervision/Verbal cueing      Medical Problem List and Plan: 1.  Decreased functional mobility secondary to left BKA  09/22/2018.             Continue CIR PT, OT 2.  Antithrombotics: -DVT/anticoagulation: Subcutaneous heparin             -antiplatelet therapy: Aspirin 81 mg daily, Plavix 75 mg daily 3. Pain Management/phantom limb pain:   Neurontin 400 3 times daily on 6/19  Oxycodone as needed 4. Mood: Provide emotional support             -antipsychotic agents: N/A 5. Neuropsych: This patient is capable of making decisions on his own behalf. 6. Skin/Wound Care: Routine skin checks 7. Fluids/Electrolytes/Nutrition: Routine in and outs.   BMP within acceptable range, except for glucose on 6/18   8.  Diabetes mellitus with peripheral neuropathy.  Hemoglobin A1c 9.0.  Resume Glucophage 500 mg twice daily.   Labile on 6/19, monitor for trend  Monitor with increased mobility 9.  Hyperlipidemia.  Lipitor 10. Essential hypertension.  Patient on lisinopril 5 mg daily prior to admission.  Resume as needed.   Relatively controlled on 6/19  Monitor with increased mobility 11.  Tobacco abuse.  Counseling 12.  Acute blood loss anemia  Hemoglobin 7.9 on 6/18, will recheck 6/22  Continue to monitor 13.  Hypoalbuminemia  Supplement initiated on 6/18   LOS: 3 days A FACE TO FACE EVALUATION WAS PERFORMED  Charlett Blake 09/27/2018, 8:36 AM

## 2018-09-27 NOTE — Progress Notes (Signed)
Occupational Therapy Session Note  Patient Details  Name: Thomas Garcia MRN: [id number] Date of Birth: 15-Jul-1940  Today's Date: 09/27/2018 OT Individual Time: 1102-1200 OT Individual Time Calculation (min): 58 min    Short Term Goals: Week 1:  OT Short Term Goal 1 (Week 1): Pt will complete toilet transfers with S using RW. OT Short Term Goal 2 (Week 1): Pt will complete tub bench transfers with S using Rw. OT Short Term Goal 3 (Week 1): Pt will complete LB dressing with S.  Skilled Therapeutic Interventions/Progress Updates:    1:1. Pt received in EOB with reported cramp in residual limb. Pt completes squat pivot transfer with supervision. Pt bathes UB with set up and LB with CGA for cleansing buttocks. Pt dons shirt with S and pants with CGA. Pt educated on TTB transfer and practices via squat pivot with CGA. Educated pt on use of hand held shower head, non skid strips and shower curtain adaptations to improve safety while showering. Exited session with pt seated in w/c, call light in reach and all needs met. Exit alarm not deemed necessary per RN, PT and OT. Pt very good about calling for A to mobilize.  Therapy Documentation Precautions:  Precautions Precautions: Fall Restrictions Weight Bearing Restrictions: Yes LLE Weight Bearing: Non weight bearing LLE Partial Weight Bearing Percentage or Pounds: BKA   Therapy/Group: Individual Therapy  Tonny Branch 09/27/2018, 11:25 AM

## 2018-09-27 NOTE — Progress Notes (Signed)
[facility] Individual Statement of Services  Patient Name:  CHUKWUKA FESTA  Date:  09/27/2018  Welcome to the [facility].  Our goal is to provide you with an individualized program based on your diagnosis and situation, designed to meet your specific needs.  With this comprehensive rehabilitation program, you will be expected to participate in at least 3 hours of rehabilitation therapies Monday-Friday, with modified therapy programming on the weekends.  Your rehabilitation program will include the following services:  Physical Therapy (PT), Occupational Therapy (OT), 24 hour per day rehabilitation nursing, Neuropsychology, Case Management (Social Worker), Rehabilitation Medicine, Nutrition Services and Pharmacy Services  Weekly team conferences will be held on Wednesdays to discuss your progress.  Your Social Worker will talk with you frequently to get your input and to update you on team discussions.  Team conferences with you and your family in attendance may also be held.  Expected length of stay: 7 to 10 days  Overall anticipated outcome: modified independent with supervision for car transfers, ambulation, and wheelchair in the community  Depending on your progress and recovery, your program may change. Your Social Worker will coordinate services and will keep you informed of any changes. Your Social Worker's name and contact numbers are listed  below.  The following services may also be recommended but are not provided by the [facility] will be made to provide these services after discharge if needed.  Arrangements include referral to agencies that provide these services.  Your insurance has been verified to be:  Medicare Your primary doctor is:  Dr. Tula Nakayama  Pertinent information will be shared with  your doctor and your insurance company.  Social Worker:  Alfonse Alpers, LCSW  [phone] or [phone]  Information discussed with and copy given to patient by: Trey Sailors, 09/27/2018, 12:23 AM

## 2018-09-28 LAB — GLUCOSE, CAPILLARY
Glucose-Capillary: 167 mg/dL — ABNORMAL HIGH (ref 70–99)
Glucose-Capillary: 175 mg/dL — ABNORMAL HIGH (ref 70–99)
Glucose-Capillary: 171 mg/dL — ABNORMAL HIGH (ref 70–99)
Glucose-Capillary: 177 mg/dL — ABNORMAL HIGH (ref 70–99)

## 2018-09-28 NOTE — Progress Notes (Signed)
Social Work Assessment and Plan  Patient Details  Name: Thomas Garcia MRN: [id number] Date of Birth: 1940-08-01  Today's Date: 09/26/2018  Problem List:  Patient Active Problem List   Diagnosis Date Noted  . Phantom limb pain ([place])   . Postoperative pain   . Unilateral complete BKA, left, sequela ([place])   . Acute blood loss anemia   . Essential hypertension   . Neuropathic pain   . Unilateral complete BKA, left, initial encounter ([place])   . Tobacco abuse   . Benign essential HTN   . Diabetic peripheral neuropathy ([place])   . Post-operative pain   . 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 PVD (peripheral vascular disease) ([place])   . 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 ([place]) 09/08/2018  . Osteomyelitis of second toe of left foot ([place]) 09/08/2018  . Cellulitis of left foot 09/08/2018  . Leukocytosis 09/08/2018  . Fever and chills 09/08/2018  . MOLE 10/26/2008  . 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 OVERWEIGHT 10/26/2008  . NICOTINE ADDICTION 10/26/2008  . ACUTE CYSTITIS 10/26/2008  . FATIGUE 10/26/2008  . COUGH 10/26/2008  . ELECTROCARDIOGRAM, ABNORMAL 10/26/2008   Past Medical History:  Past Medical History:  Diagnosis Date  . Diabetes mellitus without complication ([place])   . Kidney stones   . PONV (postoperative nausea and vomiting)    Past Surgical History:  Past Surgical History:  Procedure Laterality Date  . ABDOMINAL AORTOGRAM W/LOWER EXTREMITY N/A 09/17/2018   Procedure: ABDOMINAL AORTOGRAM W/LOWER EXTREMITY;  Surgeon: Marty Heck, MD;  Location: [facility];  Service: Cardiovascular;  Laterality: N/A;  . AMPUTATION Left 09/22/2018   Procedure: AMPUTATION BELOW KNEE LEFT;  Surgeon: Marty Heck, MD;  Location: [facility];  Service: Vascular;  Laterality: Left;  . AMPUTATION TOE Left 09/10/2018   Procedure: AMPUTATION OF THIRD TOE LEFT FOOT, DEBRIDEMENT OF ULCERS ON SECOND TOE AND PARTIAL AMPUTATION SECOND TOE ON LEFT FOOT;   Surgeon: Virl Cagey, MD;  Location: [facility];  Service: General;  Laterality: Left;  . APPLICATION OF WOUND VAC Left 09/18/2018   Procedure: APPLICATION OF WOUND VAC;  Surgeon: Marty Heck, MD;  Location: [facility];  Service: Vascular;  Laterality: Left;  . CYSTOSCOPY  05/08/2012   Procedure: CYSTOSCOPY;  Surgeon: Marissa Nestle, MD;  Location: [facility];  Service: Urology;  Laterality: N/A;  Evacuation of clots  . KIDNEY SURGERY     sutures   . PERIPHERAL VASCULAR BALLOON ANGIOPLASTY  09/17/2018   Procedure: PERIPHERAL VASCULAR BALLOON ANGIOPLASTY;  Surgeon: Marty Heck, MD;  Location: [facility];  Service: Cardiovascular;;  LT SFA and AT  . SPLENECTOMY    . TRANSMETATARSAL AMPUTATION Left 09/18/2018   Procedure: TRANSMETATARSAL AMPUTATION LEFT FOOT;  Surgeon: Marty Heck, MD;  Location: [facility];  Service: Vascular;  Laterality: Left;  . TRANSURETHRAL RESECTION OF PROSTATE  05/14/2012   Procedure: TRANSURETHRAL RESECTION OF THE PROSTATE (TURP);  Surgeon: Marissa Nestle, MD;  Location: [facility];  Service: Urology;  Laterality: N/A;   Social History:  reports that he has been smoking cigarettes. He has a 45.00 pack-year smoking history. He has never used smokeless tobacco. He reports that he does not drink alcohol or use drugs.  Family / Support Systems Marital Status: Separated How Long?: 12 years Patient Roles: Other (Comment)(grandfather; brother) Children: Breton Berns - son - [phone]; 2 other sons Anticipated Caregiver: son and brother Ability/Limitations of Caregiver:  intermittant A Caregiver Availability: Intermittent Family Dynamics: son and brother will assist, as needed, but pt will try to not bother them  Social History Preferred language: English Religion: Non-Denominational Read: Yes Write: Yes Employment Status: Retired Date Retired/Disabled/Unemployed: 2006 Age Retired: 52 Public relations account executive Issues: none  reported Guardian/Conservator: N/A - MD has determined that pt is capable of making his own decisions.   Abuse/Neglect Abuse/Neglect Assessment Can Be Completed: Yes Physical Abuse: Denies Verbal Abuse: Denies Sexual Abuse: Denies Exploitation of patient/patient's resources: Denies Self-Neglect: Denies  Emotional Status Pt's affect, behavior and adjustment status: Pt reports being up and down in his mood following amputation. He is motivated to work hard on CIR so that he can get home and get his prosthesis. Recent Psychosocial Issues: none reported Psychiatric History: none reported Substance Abuse History: none reported  Patient / Family Perceptions, Expectations & Goals Pt/Family understanding of illness & functional limitations: Pt has a good understanding of his condition. Premorbid pt/family roles/activities: Pt likes to spend time with his brother. Anticipated changes in roles/activities/participation: Pt will not be able to drive initially and brother or son will assist. Pt/family expectations/goals: Pt wants to work hard in [place], get home, and get his prosthesis.  Community Resources [employer] Scripts: None Premorbid Home Care/DME Agencies: None Transportation available at discharge: family Resource referrals recommended: Neuropsychology, Support group (specify)(Amputee Support Group)  Discharge Planning Living Arrangements: Alone Support Systems: Children, Other relatives Type of Residence: Private residence(mobile home - son is Warden/ranger ramp) Insurance Resources: Chartered certified accountant Resources: Radio broadcast assistant Screen Referred: No Money Management: Patient Does the patient have any problems obtaining your medications?: No Home Management: Pt was doing all of this PTA.  Son or brother may need to help him. Patient/Family Preliminary Plans: Pt plans to return to his home and his brother is next door on pt's property. Social Work Anticipated Follow Up Needs:  HH/OP, Support Group Expected length of stay: 7-10 days  Clinical Impression CSW met with pt and later spoke with pt's son to introduce self and role of CSW, as well as to complete assessment.  Pt was open with CSW and admitted that he was having some down moments.  CSW offered support and told him of support group and peer support from Amputee support group.  Pt may be open to this later.  Pt will have some support from his retired brother and from his son.  CSW confirmed with son that ramp is done and he will come for family education prior to pt's d/c and will be the one to take him home.   Pt did not express any concerns/questions/needs at this time and is grateful to be on CIR and is pleased with most of his team/care, especially when [place] Ormond incorporated music in their session.  CSW will continue to follow and assist as needed.    Jersee Winiarski, Silvestre Mesi 09/28/2018, 11:41 PM

## 2018-09-28 NOTE — Progress Notes (Signed)
[place] PHYSICAL MEDICINE & REHABILITATION PROGRESS NOTE  Subjective/Complaints: No sig stump or phantom pain. No other c/os ROS: Denies CP, shortness of breath, nausea, vomiting, diarrhea.  Objective: Vital Signs: Blood pressure (!) 138/58, pulse 87, temperature (!) 97.5 F (36.4 C), temperature source Oral, resp. rate 17, height 5\' 9"  (1.753 m), weight 81.9 kg, SpO2 98 %. No results found. No results for input(s): WBC, HGB, HCT, PLT in the last 72 hours. No results for input(s): NA, K, CL, CO2, GLUCOSE, BUN, CREATININE, CALCIUM in the last 72 hours.  Physical Exam: BP (!) 138/58 (BP Location: Left Arm)   Pulse 87   Temp (!) 97.5 F (36.4 C) (Oral)   Resp 17   Ht 5\' 9"  (1.753 m)   Wt 81.9 kg   SpO2 98%   BMI 26.67 kg/m  Constitutional: No distress . Vital signs reviewed. HENT: Normocephalic.  Atraumatic. Eyes: EOMI.  No discharge. Cardiovascular: No JVD. Respiratory: Normal effort. GI: Non-distended. Musc: Left BKA with edema and tenderness, stable Neurological: He is alert.  Follows commands.   Motor: Bilateral upper extremities, right lower extremity: 5/5 proximal to distal Left lower extremity: >/3/5 hip flexion, limited due to pain  Skin:    Psychiatric: Anxious with altered affect  Assessment/Plan: 1. Functional deficits secondary to left BKA which require 3+ hours per day of interdisciplinary therapy in a comprehensive inpatient rehab setting.  Physiatrist is providing close team supervision and 24 hour management of active medical problems listed below.  Physiatrist and rehab team continue to assess barriers to discharge/monitor patient progress toward functional and medical goals  Care Tool:  Bathing    Body parts bathed by patient: Right arm, Left arm, Chest, Abdomen, Front perineal area, Buttocks, Right upper leg, Left upper leg, Face   Body parts bathed by helper: Right lower leg Body parts n/a: Left lower leg   Bathing assist Assist Level:  Minimal Assistance - Patient > 75%     Upper Body Dressing/Undressing Upper body dressing   What is the patient wearing?: Pull over shirt    Upper body assist Assist Level: Contact Guard/Touching assist    Lower Body Dressing/Undressing Lower body dressing      What is the patient wearing?: Pants     Lower body assist Assist for lower body dressing: Moderate Assistance - Patient 50 - 74%     Toileting Toileting    Toileting assist Assist for toileting: Contact Guard/Touching assist     Transfers Chair/bed transfer  Transfers assist     Chair/bed transfer assist level: Contact Guard/Touching assist     Locomotion Ambulation   Ambulation assist      Assist level: Contact Guard/Touching assist Assistive device: Walker-rolling Max distance: 15'   Walk 10 feet activity   Assist  Walk 10 feet activity did not occur: Safety/medical concerns(pain)  Assist level: Contact Guard/Touching assist Assistive device: Walker-rolling   Walk 50 feet activity   Assist Walk 50 feet with 2 turns activity did not occur: (pain)         Walk 150 feet activity   Assist Walk 150 feet activity did not occur: Safety/medical concerns(activity tolerance)         Walk 10 feet on uneven surface  activity   Assist Walk 10 feet on uneven surfaces activity did not occur: Safety/medical concerns(pain, decreased balance)         Wheelchair     Assist Will patient use wheelchair at discharge?: Yes Type of Wheelchair: Manual  Wheelchair assist level: Supervision/Verbal cueing Max wheelchair distance: 150'    Wheelchair 50 feet with 2 turns activity    Assist        Assist Level: Supervision/Verbal cueing   Wheelchair 150 feet activity     Assist     Assist Level: Supervision/Verbal cueing      Medical Problem List and Plan: 1.  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             Continue CIR PT, OT 2.   Antithrombotics: -DVT/anticoagulation: Subcutaneous heparin             -antiplatelet therapy: Aspirin 81 mg daily, Plavix 75 mg daily 3. Pain Management/phantom limb pain:   Neurontin 400mg  3 times daily on 6/19  Oxycodone as needed 4. Mood: Provide emotional support             -antipsychotic agents: N/A 5. Neuropsych: This patient is capable of making decisions on his own behalf. 6. Skin/Wound Care: Routine skin checks, healing well still some edema 7. Fluids/Electrolytes/Nutrition: Routine in and outs.   BMP within acceptable range, except for glucose on 6/18   8.  Diabetes mellitus with peripheral neuropathy.  Hemoglobin A1c 9.0.  Resume Glucophage 500 mg twice daily.   Labile on 6/19, monitor for trend   CBG (last 3)  Recent Labs    09/27/18 1635 09/27/18 2114 09/28/18 0606  GLUCAP 152* 173* 167*  fair control 6/201 9.  Hyperlipidemia.  Lipitor 10. Essential hypertension.  Patient on lisinopril 5 mg daily prior to admission.  Resume as needed.   Relatively controlled on 6/19  Monitor with increased mobility 11.  Tobacco abuse.  Counseling 12.  Acute blood loss anemia  Hemoglobin 7.9 on 6/18, will recheck 6/22  Continue to monitor 13.  Hypoalbuminemia  Supplement initiated on 6/18   LOS: 4 days A FACE TO FACE EVALUATION WAS PERFORMED  Charlett Blake 09/28/2018, 8:15 AM

## 2018-09-29 ENCOUNTER — Inpatient Hospital Stay (HOSPITAL_COMMUNITY): Payer: Medicare Other

## 2018-09-29 ENCOUNTER — Inpatient Hospital Stay (HOSPITAL_COMMUNITY): Payer: Medicare Other | Admitting: Physical Therapy

## 2018-09-29 ENCOUNTER — Inpatient Hospital Stay (HOSPITAL_COMMUNITY): Payer: Medicare Other | Admitting: Occupational Therapy

## 2018-09-29 DIAGNOSIS — E119 Type 2 diabetes mellitus without complications: Secondary | ICD-10-CM

## 2018-09-29 LAB — GLUCOSE, CAPILLARY
Glucose-Capillary: 193 mg/dL — ABNORMAL HIGH (ref 70–99)
Glucose-Capillary: 150 mg/dL — ABNORMAL HIGH (ref 70–99)
Glucose-Capillary: 143 mg/dL — ABNORMAL HIGH (ref 70–99)
Glucose-Capillary: 144 mg/dL — ABNORMAL HIGH (ref 70–99)

## 2018-09-29 MED ORDER — METFORMIN HCL 850 MG PO TABS
850.0000 mg | ORAL_TABLET | Freq: Two times a day (BID) | ORAL | Status: DC
  Administered 2018-09-29 – 2018-09-30 (×2): 850 mg via ORAL
  Filled 2018-09-29 (×2): qty 1

## 2018-09-29 MED ORDER — ATORVASTATIN CALCIUM 10 MG PO TABS: 10.0000 mg | ORAL_TABLET | Freq: Every day | ORAL | 0 refills | Status: DC

## 2018-09-29 MED ORDER — GABAPENTIN 400 MG PO CAPS: 400.0000 mg | ORAL_CAPSULE | Freq: Three times a day (TID) | ORAL | 0 refills | Status: DC

## 2018-09-29 MED ORDER — OXYCODONE-ACETAMINOPHEN 5-325 MG PO TABS: 1.0000 | ORAL_TABLET | ORAL | 0 refills | Status: DC | PRN

## 2018-09-29 MED ORDER — ACETAMINOPHEN 325 MG PO TABS: 650.0000 mg | ORAL_TABLET | ORAL | Status: DC | PRN

## 2018-09-29 MED ORDER — CLOPIDOGREL BISULFATE 75 MG PO TABS: 75.0000 mg | ORAL_TABLET | Freq: Every day | ORAL | 0 refills | Status: DC

## 2018-09-29 MED ORDER — PANTOPRAZOLE SODIUM 40 MG PO TBEC: 40.0000 mg | DELAYED_RELEASE_TABLET | Freq: Every day | ORAL | 0 refills | Status: DC

## 2018-09-29 MED ORDER — METFORMIN HCL 850 MG PO TABS: 850.0000 mg | ORAL_TABLET | Freq: Two times a day (BID) | ORAL | 1 refills | Status: DC

## 2018-09-29 NOTE — Progress Notes (Signed)
Occupational Therapy Session Note  Patient Details  Name: Thomas Garcia MRN: [id number] Date of Birth: 1940/12/04  Today's Date: 09/29/2018 OT Individual Time: 1340-1427 OT Individual Time Calculation (min): 47 min    Short Term Goals: Week 1:  OT Short Term Goal 1 (Week 1): Pt will complete toilet transfers with S using RW. OT Short Term Goal 2 (Week 1): Pt will complete tub bench transfers with S using Rw. OT Short Term Goal 3 (Week 1): Pt will complete LB dressing with S.  Skilled Therapeutic Interventions/Progress Updates:    Pt received in w/c with c/o residual limb soreness but request for intervention. Pt completed w/c propulsion 150 ft to the therapy gym. Pt required minimal cueing for proper propulsion technique. (S) provided during stand pivot transfer to therapy mat. Pt transitioned into prone on the mat with minimal cueing. Pt completed prone pushups, back extensions, and BLE extensions with minimal cueing. Pt edu on importance of residual limb positioning. Pt very motivated to participate. Pt returned to EOM and returned demonstration of residual limb wrapping. Pt returned to w/c and propelled back to room. Chair alarm was activated and pt left with all needs met.   Therapy Documentation Precautions:  Precautions Precautions: Fall Restrictions Weight Bearing Restrictions: Yes LLE Weight Bearing: Non weight bearing LLE Partial Weight Bearing Percentage or Pounds: BKA   Therapy/Group: Individual Therapy  Curtis Sites 09/29/2018, 7:27 AM

## 2018-09-29 NOTE — Progress Notes (Signed)
Physical Therapy Session Note  Patient Details  Name: Thomas Garcia MRN: [id number] Date of Birth: 1940/07/31  Today's Date: 09/29/2018 PT Individual Time: 1000-1055 PT Individual Time Calculation (min): 55 min   Short Term Goals: Week 1:  PT Short Term Goal 1 (Week 1): = LTGs due to ELOS  Skilled Therapeutic Interventions/Progress Updates:   Pt received in w/c and agreeable to therapy w/ encouragement, no c/o pain throughout session. Pt adamant about going home ASAP. Has prolonged discussion regarding what assistance he will have at home. Pt states his sons and brother will both be around to help w/ meals and chores. He feels he can do everything else without help. Discussed need to be able to perform transfers w/o assist, as his family might not be there 24/7. Pt verbalized understanding and in agreement. Practice bed and couch transfers in ADL apartment, as well as car transfer, all w/ supervision and no cues needed for w/c parts management and safe set-up of w/c. Pt did not have w/c locked at one point, but able to correct once he attempted to push up on armrests and realized there was movement. Had prolonged discussion regarding bathroom set-up w/ step up to bath tub. Set-up simulation of this, however upon attempting pt decided he would just sponge bathe as he felt is was too unsafe for him to attempt shower transfer at home - made OT aware. Discussed w/c size for him and benefits of 16x16 w/c for turning radius in his house, however pt requesting to keep 18" in length at it supports his LEs more, but 16" width was suitable to him and did not fit tightly. Returned to room and ended session in w/c, all needs in reach.   Therapy Documentation Precautions:  Precautions Precautions: Fall Restrictions Weight Bearing Restrictions: Yes LLE Weight Bearing: Non weight bearing LLE Partial Weight Bearing Percentage or Pounds: BKA Vital Signs:   Therapy/Group: Individual Therapy  Amarianna Abplanalp Clent Demark 09/29/2018, 12:39 PM

## 2018-09-29 NOTE — Progress Notes (Signed)
Occupational Therapy Session Note  Patient Details  Name: Thomas Garcia MRN: [id number] Date of Birth: May 02, 1940  Today's Date: 09/29/2018 OT Individual Time: 0736-0900 OT Individual Time Calculation (min): 84 min   Short Term Goals: Week 1:  OT Short Term Goal 1 (Week 1): Pt will complete toilet transfers with S using RW. OT Short Term Goal 2 (Week 1): Pt will complete tub bench transfers with S using Rw. OT Short Term Goal 3 (Week 1): Pt will complete LB dressing with S.  Skilled Therapeutic Interventions/Progress Updates:    Pt greeted seated EOB with nursing administering medications. Pt upset about having bed alarm on. Pt stated he has been getting up and transferring in/out of bathroom and to and from wc without assistance all weekend. Explained falls policy to patient and that he has not been made mod I in the room, therefore he needs staff present for transfers. Pt frustrated with policy and states he wants to go home. OT used therapeutic use of self to continue to explain policy and redirect patient. Pt able to set-up wc and completed squat-pivot transfer w/ supervision. Pt agreeable to bathing/dressing at the sink. UB bathing from wc completed mod I, pt declined LB bathing/dressing. Pt showed OT hand drawing of the layout of his bathroom and discussed bathroom transfers in simulated home environment. Pt agreeable to try to ambulate in and out of bathroom using RW. Pt needed CGA/min A for ambulation and verbal cues for hand placement when turning with RW. Pt fatigued quickly took very small hops with ambulation. Pt propelled wc to tub room and discussed tub shower transfer with pt and the need for tub bench to shower safely. Pt reported feeling a little "queezy" and declined practicing actual transfer. Pt returned to room and left seated in wc with chair alarm on and needs met. Reviewed importance of calling staff for assistance when needing to transfer.   Therapy  Documentation Precautions:  Precautions Precautions: Fall Restrictions Weight Bearing Restrictions: Yes LLE Weight Bearing: Non weight bearing LLE Partial Weight Bearing Percentage or Pounds: BKA Pain:     Therapy/Group: Individual Therapy  Valma Cava 09/29/2018, 9:26 AM

## 2018-09-29 NOTE — Progress Notes (Signed)
[place] PHYSICAL MEDICINE & REHABILITATION PROGRESS NOTE  Subjective/Complaints: Patient seen sitting up in his chair working with therapy this morning.  He states he did not sleep well overnight because his bed alarm went off when he was trying to transfer; he states it was his fault.  Discussed transfers with therapies. He is appreciative of his care.  ROS: Denies CP, shortness of breath, nausea, vomiting, diarrhea.  Objective: Vital Signs: Blood pressure (!) 110/53, pulse 90, temperature 98.2 F (36.8 C), temperature source Oral, resp. rate 12, height 5\' 9"  (1.753 m), weight 81.9 kg, SpO2 99 %. No results found. No results for input(s): WBC, HGB, HCT, PLT in the last 72 hours. No results for input(s): NA, K, CL, CO2, GLUCOSE, BUN, CREATININE, CALCIUM in the last 72 hours.  Physical Exam: BP (!) 110/53 (BP Location: Left Arm)   Pulse 90   Temp 98.2 F (36.8 C) (Oral)   Resp 12   Ht 5\' 9"  (1.753 m)   Wt 81.9 kg   SpO2 99%   BMI 26.67 kg/m  Constitutional: No distress . Vital signs reviewed. HENT: Normocephalic.  Atraumatic. Eyes: EOMI.  No discharge. Cardiovascular: No JVD. Respiratory: Normal effort. GI: Non-distended. Musc: Left BKA with edema and tenderness, improving Neurological: He is alert.  Follows commands.   Motor: Bilateral upper extremities, right lower extremity: 5/5 proximal to distal Left lower extremity: 4+/5 hip flexion Skin: BKA with dressing C/D/I Psychiatric: Slightly anxious  Assessment/Plan: 1. Functional deficits secondary to left BKA which require 3+ hours per day of interdisciplinary therapy in a comprehensive inpatient rehab setting.  Physiatrist is providing close team supervision and 24 hour management of active medical problems listed below.  Physiatrist and rehab team continue to assess barriers to discharge/monitor patient progress toward functional and medical goals  Care Tool:  Bathing    Body parts bathed by patient: Right arm,  Left arm, Chest, Abdomen, Front perineal area, Buttocks, Right upper leg, Left upper leg, Face   Body parts bathed by helper: Right lower leg Body parts n/a: Left lower leg   Bathing assist Assist Level: Minimal Assistance - Patient > 75%     Upper Body Dressing/Undressing Upper body dressing   What is the patient wearing?: Pull over shirt    Upper body assist Assist Level: Contact Guard/Touching assist    Lower Body Dressing/Undressing Lower body dressing      What is the patient wearing?: Pants     Lower body assist Assist for lower body dressing: Moderate Assistance - Patient 50 - 74%     Toileting Toileting    Toileting assist Assist for toileting: Contact Guard/Touching assist     Transfers Chair/bed transfer  Transfers assist     Chair/bed transfer assist level: Contact Guard/Touching assist     Locomotion Ambulation   Ambulation assist      Assist level: Contact Guard/Touching assist Assistive device: Walker-rolling Max distance: 15'   Walk 10 feet activity   Assist  Walk 10 feet activity did not occur: Safety/medical concerns(pain)  Assist level: Contact Guard/Touching assist Assistive device: Walker-rolling   Walk 50 feet activity   Assist Walk 50 feet with 2 turns activity did not occur: (pain)         Walk 150 feet activity   Assist Walk 150 feet activity did not occur: Safety/medical concerns(activity tolerance)         Walk 10 feet on uneven surface  activity   Assist Walk 10 feet on uneven surfaces activity  did not occur: Safety/medical concerns(pain, decreased balance)         Wheelchair     Assist Will patient use wheelchair at discharge?: Yes Type of Wheelchair: Manual    Wheelchair assist level: Supervision/Verbal cueing Max wheelchair distance: 150'    Wheelchair 50 feet with 2 turns activity    Assist        Assist Level: Supervision/Verbal cueing   Wheelchair 150 feet activity      Assist     Assist Level: Supervision/Verbal cueing      Medical Problem List and Plan: 1.  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             Continue CIR   Weekend notes reviewed-stable 2.  Antithrombotics: -DVT/anticoagulation: Subcutaneous heparin             -antiplatelet therapy: Aspirin 81 mg daily, Plavix 75 mg daily 3. Pain Management/phantom limb pain:   Neurontin 400mg  3 times daily on 6/19  Oxycodone as needed  Controlled on 6/22 4. Mood: Provide emotional support             -antipsychotic agents: N/A 5. Neuropsych: This patient is capable of making decisions on his own behalf. 6. Skin/Wound Care: Routine skin checks 7. Fluids/Electrolytes/Nutrition: Routine in and outs.   BMP within acceptable range, except for glucose on 6/18   8.  Diabetes mellitus with peripheral neuropathy.  Hemoglobin A1c 9.0.    Resumed Glucophage 500 mg twice daily, increased to 850 twice daily on 6/22.    CBG (last 3)  Recent Labs    09/28/18 1654 09/28/18 2119 09/29/18 0701  GLUCAP 171* 177* 193*   9.  Hyperlipidemia.  Lipitor 10. Essential hypertension.  Patient on lisinopril 5 mg daily prior to admission.  Resume as needed.   Controlled on 6/22  Monitor with increased mobility 11.  Tobacco abuse.  Counseling 12.  Acute blood loss anemia  Hemoglobin 7.9 on 6/18, labs pending  Continue to monitor 13.  Hypoalbuminemia  Supplement initiated on 6/18   LOS: 5 days A FACE TO FACE EVALUATION WAS PERFORMED  Ankit Lorie Phenix 09/29/2018, 9:19 AM

## 2018-09-29 NOTE — Discharge Summary (Addendum)
Physician Discharge Summary  Patient ID: Thomas Garcia MRN: [id number] DOB/AGE: [birth year] 78 y.o.  Admit date: 09/24/2018 Discharge date: 09/30/2018  Discharge Diagnoses:  Active Problems:   Unilateral complete BKA, left, initial encounter (HCC)   Unilateral complete BKA, left, sequela (HCC)   Acute blood loss anemia   Essential hypertension   Neuropathic pain   Phantom limb pain (HCC)   Postoperative pain   Diabetes mellitus type 2 in nonobese [facility])   Discharged Condition: stable  Significant Diagnostic Studies: Dg Chest 2 View  Result Date: 09/08/2018 CLINICAL DATA:  Suspected sepsis. Pt has open wounds to 3rd toe of Lt foot with redness and swelling to entire Lt foot. Pt states wounds have been present x several weeks. Pt c/o chills intermittently. Hx smoker, diabetes Suspected Sepsis EXAM: CHEST - 2 VIEW COMPARISON:  None. FINDINGS: Normal mediastinum and cardiac silhouette. Normal pulmonary vasculature. No evidence of effusion, infiltrate, or pneumothorax. No acute bony abnormality. IMPRESSION: No acute cardiopulmonary process. Electronically Signed   By: Suzy Bouchard M.D.   On: 09/08/2018 11:57   Mri Left Foot Without Contrast  Result Date: 09/08/2018 CLINICAL DATA:  Left foot wound with swelling for 6 months. Diabetes. Osteomyelitis suspected. EXAM: MRI OF THE LEFT FOOT WITHOUT CONTRAST TECHNIQUE: Multiplanar, multisequence MR imaging of the left forefoot was performed. No intravenous contrast was administered. COMPARISON:  Radiographs same date FINDINGS: Despite efforts by the technologist and patient, mild motion artifact is present on today's exam and could not be eliminated. This reduces exam sensitivity and specificity. Bones/Joint/Cartilage Marrow assessment is limited by motion and incomplete fat saturation on the T2 weighted images. Sagittal inversion recovery images were obtained. There is cortical destruction with increased T2 and decreased T1 marrow signal in the head  of the 3rd proximal phalanx, and probably the base of the 3rd middle phalanx, suspicious for osteomyelitis as correlated with the earlier radiographs. There is possible involvement of the 2nd metatarsal head as well, although this is less definitive. The 1st, 4th and 5th toes appear intact. There are mild degenerative changes at the 1st metatarsophalangeal joint. No significant joint effusions. The alignment is normal at the Lisfranc joint. Ligaments Intact Lisfranc ligament. The collateral ligaments of the metatarsophalangeal joints are intact. Muscles and Tendons Increased T2 signal throughout the forefoot musculature, most likely due to underlying diabetes. No focal fluid collection or tenosynovitis. Soft tissues There is soft tissue swelling in the 3rd toe. In correlation with the earlier radiographs, there is soft tissue emphysema as well. There is possible mild soft tissue swelling in the 2nd toe. No focal fluid collections are identified. IMPRESSION: 1. In correlation with the earlier radiographs, the findings are highly suspicious for osteomyelitis involving the head of the 3rd proximal phalanx, and likely the 3rd middle phalanx. 2. Possible osteomyelitis of the 2nd proximal phalangeal head. 3. Soft tissue swelling in the 3rd and 2nd toes without focal fluid collection. Electronically Signed   By: Richardean Sale M.D.   On: 09/08/2018 17:15   US Arterial Abi (screening Lower Extremity)  Result Date: 09/08/2018 CLINICAL DATA:  Peripheral vascular disease. Nonhealing left foot ulcer. Chronic osteomyelitis. Smoking history. EXAM: NONINVASIVE PHYSIOLOGIC VASCULAR STUDY OF BILATERAL LOWER EXTREMITIES TECHNIQUE: Evaluation of both lower extremities were performed at rest, including calculation of ankle-brachial indices with single level Doppler, pressure and pulse volume recording. COMPARISON:  None. FINDINGS: Right ABI:  0.51 Left ABI:  0.49 Right Lower Extremity: Monophasic and a few biphasic waveforms in the  right ankle. Left Lower  Extremity:  Irregular arterial waveforms in left ankle. 0.5-0.79 Moderate PAD IMPRESSION: Right ABI is 0.51 and left ABI is 0.49. Findings are suggestive for moderate to severe peripheral arterial disease. Electronically Signed   By: Markus Daft M.D.   On: 09/08/2018 16:29   Dg Foot Complete Left  Result Date: 09/08/2018 CLINICAL DATA:  Third toe wound, initial encounter EXAM: LEFT FOOT - COMPLETE 3+ VIEW COMPARISON:  None. FINDINGS: Soft tissue gas is noted in the third toe near its base. Some bony resorption is noted in the distal aspect of the third proximal phalanx. IMPRESSION: Soft tissue infection involving the third toe with some resorption in the distal aspect of the third proximal phalanx consistent with osteomyelitis. Electronically Signed   By: Inez Catalina M.D.   On: 09/08/2018 11:49    Labs:  Basic Metabolic Panel: Recent Labs  Lab 09/25/18 0748  NA 140  K 3.5  CL 106  CO2 25  GLUCOSE 170*  BUN 9  CREATININE 0.81  CALCIUM 8.4*    CBC: Recent Labs  Lab 09/25/18 0748  WBC 10.3  NEUTROABS 6.1  HGB 7.9*  HCT 24.8*  MCV 91.5  PLT 589*    CBG: Recent Labs  Lab 09/29/18 0701 09/29/18 1154 09/29/18 1701 09/29/18 2122 09/30/18 0646  GLUCAP 193* 150* 143* 144* 159*  Family history.  Positive for hypertension and kidney disease.  Negative colon cancer  Brief HPI:   Thomas Garcia. Thomas Garcia is a 78 year old right-handed male history of diabetes mellitus tobacco abuse and peripheral vascular disease.  He lives alone he has a brother who checks on him.  Presented 09/17/2018 with ischemic left lower extremity and recent amputation of left third toe 09/10/2018 discharge to home.  Aortogram completed left lower extremity showing aortoiliac segment very calcified and very large posterior plaque in the distal external iliac common femoral artery.  No change with conservative care.  Patient underwent left transmetatarsal amputation with placement of wound VAC  09/18/2018 but later required a BKA 09/12/2018.  Maintained on vancomycin x48 hours.  Hospital course pain management.  Subcutaneous heparin for DVT prophylaxis.  Patient was admitted for a comprehensive rehab program.  Hospital Course: ZIGMUND LINSE was admitted to rehab 09/24/2018 for inpatient therapies to consist of PT, ST and OT at least three hours five days a week. Past admission physiatrist, therapy team and rehab RN have worked together to provide customized collaborative inpatient rehab.  Pertaining to left BKA surgical site healing nicely patient would follow-up vascular surgery.  Subcutaneous heparin for DVT prophylaxis.  No bleeding episodes.  He remained on aspirin and Plavix.  Pain management to use of Neurontin 3 times daily as well as oxycodone as needed.  Blood sugars monitored closely hemoglobin A1c of 9.0 Glucophage was increased to 850 mg twice daily.  He remained on Lipitor for hyperlipidemia.  Blood pressures controlled patient had been on lisinopril prior to admission patient should follow-up with primary MD for both diabetes mellitus and blood pressure control.  Physical exam.  Blood pressure 119/51 pulse 82 temperature 98 respirations 15 oxygen saturations 100% room air Constitutional.  Well-developed well-nourished HEENT Head.  Normocephalic and atraumatic Eyes.  EOMs normal no discharge without nystagmus Respiratory.  Effort normal no respiratory distress GI.  Soft nontender without rebound Musculoskeletal.  Left BKA with edema and tenderness Neurological.  Alert follows commands upper extremities 5 out of 5 left lower extremity limited by pain  Rehab course: During patient's stay in rehab weekly team conferences were  held to monitor patient's progress, set goals and discuss barriers to discharge. At admission, patient required min mod assist for stand pivot transfers moderate assist for sit to stand.  Set up for upper body bathing and dressing minimal assist lower body  bathing and dressing  He has had improvement in activity tolerance, balance, postural control as well as ability to compensate for deficits. He has had improvement in functional use RUE/LUE  and RLE/LLE as well as improvement in awareness.  Patient performs supine to sit supervision perform toilet transfers contact-guard using a rolling walker propels wheelchair supervision gather belongings for activities dressing, grooming and homemaking full teaching completed plan discharge to home       Disposition: Discharged home    Diet: Diabetic diet  Special Instructions: No driving smoking or alcohol  Follow up with PCP on resuming blood pressure meds  Medications at discharge 1.  Tylenol as needed 2.  Aspirin 81 mg daily 3 Plavix 75 mg daily 4.  Lipitor 10 mg p.o. daily 5.  Neurontin 400 mg p.o. 3 times daily 6.  Glucophage 850 mg p.o. twice daily 7.  Oxycodone 1 to 2 tablets every 4 hours as needed pain 8.  Protonix 40 mg p.o. daily  Discharge Instructions    Ambulatory referral to Physical Medicine Rehab   Complete by: As directed    Moderate complexity follow-up 1 to 2 weeks left BKA      Follow-up Information    Jamse Arn, MD Follow up.   Specialty: Physical Medicine and Rehabilitation Why: Office to call for appointment Contact information: [address] [place] [address] [phone]        Marty Heck, MD Follow up.   Specialty: Vascular Surgery Why: Call for appointment 2 weeks Contact information: [place] [address] [phone]        Fayrene Helper, MD Follow up.   Specialty: Family Medicine Why: Dr. Moshe Cipro is not taking new patients currently, but they have a new physician and the office will call you to make an appointment with Dr. Holly Bodily. The office has your number & Michael's number. Call them if you don't hear from them in a couple of weeks. Contact information: [address]  [place] [address] [phone]           Signed: Cathlyn Parsons 10/01/2018, 8:54 AM Patient seen and examined by me on day of discharge. Delice Lesch, MD, ABPMR

## 2018-09-29 NOTE — Progress Notes (Signed)
Patient ID: Thomas Garcia, male   DOB: 1940-06-19, 78 y.o.   MRN: [id number]      Diagnosis codes:   N82.956O  Height:     5'9"           Weight:     180lbs/81.9kgs      Patient suffers from a left below the knee amputation which impairs his ability to perform daily activities like toileting, dressing, feeding, grooming in the home.  A cane or walker will not resolve issue with performing activities of daily living.  A wheelchair will allow patient to safely perform daily activities.  Patient is not able to propel himself in the home using a standard weight wheelchair due to arm weakness and endurance.  Patient can self propel in the lightweight wheelchair.

## 2018-09-30 ENCOUNTER — Encounter (HOSPITAL_COMMUNITY): Payer: Medicare Other | Admitting: Occupational Therapy

## 2018-09-30 ENCOUNTER — Ambulatory Visit (HOSPITAL_COMMUNITY): Payer: Medicare Other | Admitting: Physical Therapy

## 2018-09-30 LAB — GLUCOSE, CAPILLARY: Glucose-Capillary: 159 mg/dL — ABNORMAL HIGH (ref 70–99)

## 2018-09-30 NOTE — Plan of Care (Signed)
  Problem: Consults Goal: RH LIMB LOSS PATIENT EDUCATION Description: Description: See Patient Education module for eduction specifics. Outcome: Progressing Goal: Skin Care Protocol Initiated - if Braden Score 18 or less Description: If consults are not indicated, leave blank or document N/A Outcome: Progressing Goal: Diabetes Guidelines if Diabetic/Glucose > 140 Description: If diabetic or lab glucose is > 140 mg/dl - Initiate Diabetes/Hyperglycemia Guidelines & Document Interventions  Outcome: Progressing   Problem: RH BOWEL ELIMINATION Goal: RH STG MANAGE BOWEL WITH ASSISTANCE Description: STG Manage Bowel with Min Assistance. Outcome: Progressing Goal: RH STG MANAGE BOWEL W/MEDICATION W/ASSISTANCE Description: STG Manage Bowel with Medication with Evans. Outcome: Progressing   Problem: RH SKIN INTEGRITY Goal: RH STG SKIN FREE OF INFECTION/BREAKDOWN Description: Patients skin will remain free from further infection or breakdown with min assist. Outcome: Progressing Goal: RH STG MAINTAIN SKIN INTEGRITY WITH ASSISTANCE Description: STG Maintain Skin Integrity With min Assistance. Outcome: Progressing Goal: RH STG ABLE TO PERFORM INCISION/WOUND CARE W/ASSISTANCE Description: STG Able To Perform Incision/Wound Care With Assistance. Outcome: Progressing   Problem: RH SAFETY Goal: RH STG ADHERE TO SAFETY PRECAUTIONS W/ASSISTANCE/DEVICE Description: STG Adhere to Safety Precautions With mod Assistance/Device. Outcome: Progressing   Problem: RH PAIN MANAGEMENT Goal: RH STG PAIN MANAGED AT OR BELOW PT'S PAIN GOAL Description: < 4 Outcome: Progressing   Problem: RH KNOWLEDGE DEFICIT LIMB LOSS Goal: RH STG INCREASE KNOWLEDGE OF SELF CARE AFTER LIMB LOSS Description: With min assist. Outcome: Progressing   Problem: RH BOWEL ELIMINATION Goal: RH STG MANAGE BOWEL W/MEDICATION W/ASSISTANCE Description: STG Manage Bowel with Medication with Assistance. Outcome:  Progressing

## 2018-09-30 NOTE — Progress Notes (Signed)
Physical Therapy Discharge Summary  Patient Details  Name: Thomas Garcia MRN: [id number] Date of Birth: September 12, 1940  Today's Date: 09/30/2018 PT Individual Time: 0900-0955 PT Individual Time Calculation (min): 55 min   Pt in w/c and agreeable to therapy, pain as detailed below. Son present for education prior to d/c home today. Pt self-propelled w/c around unit, mod/i, demonstrated and educated w/c parts management to son as well although pt is able to perform these tasks by himself. Pt performed stand pivot transfer and bed mobility, mod/i. Pt ambulated 24' w/ RW w/ supervision, and son plans to be present any time pt needs to ambulate in his house.  Also demonstrated proper guarding techniques as well for gait. Discussed, both w/ son and pt, amputee education including residual limb care and desensitization, review of HEP as prescribed by primary PT, importance of residual limb stretching and ROM, and residual limb wrapping. Son asked appropriate questions and verbalized understanding of all education. Returned to room and ended session in w/c, all needs in reach and in care of son.   Patient has met 9 of 11 long term goals due to improved activity tolerance, improved balance, increased strength, increased range of motion, decreased pain and ability to compensate for deficits.  Patient to discharge at a wheelchair level Modified Independent, supervision gait w/ RW for short distances.  Patient's care partner is independent to provide the necessary physical assistance at discharge.  Reasons goals not met: Pt continues to require supervision for dynamic standing balance and household gait for short distances. Pt's son is able to provide this.   Recommendation:  Patient will benefit from ongoing skilled PT services in home health setting to continue to advance safe functional mobility, address ongoing impairments in functional balance, LLE strength and ROM, and endurance, and minimize fall  risk.  Equipment: manual w/c and RW  Reasons for discharge: treatment goals met and discharge from hospital  Patient/family agrees with progress made and goals achieved: Yes  PT Discharge Precautions/Restrictions Precautions Precautions: None Restrictions Weight Bearing Restrictions: Yes LLE Weight Bearing: Non weight bearing Pain Pain Assessment Pain Scale: 0-10 Pain Score: 7  Vision/Perception  Perception Perception: Within Functional Limits Praxis Praxis: Intact  Cognition Overall Cognitive Status: Within Functional Limits for tasks assessed Arousal/Alertness: Awake/alert Orientation Level: Oriented X4 Sustained Attention: Appears intact Memory: Appears intact Awareness: Appears intact Problem Solving: Appears intact Safety/Judgment: Appears intact Sensation Sensation Light Touch: Impaired by gross assessment(Impaired in all distal extremities at baseline 2/2 neuropathy, impaired at L residual limb incision site) Coordination Gross Motor Movements are Fluid and Coordinated: Yes(WFL w/ L BKA) Fine Motor Movements are Fluid and Coordinated: Yes Motor  Motor Motor: Within Functional Limits  Mobility Bed Mobility Bed Mobility: Rolling Right;Rolling Left;Supine to Sit;Sit to Supine Rolling Right: Independent Rolling Left: Independent Supine to Sit: Independent Sit to Supine: Independent Transfers Transfers: Stand to Sit;Sit to [facility] Sit to Stand: Independent with assistive device Stand to Sit: Independent with assistive device Stand Pivot Transfers: Independent with assistive device Transfer (Assistive device): Other (Comment)(armrests) Locomotion  Gait Ambulation: Yes Gait Assistance: Supervision/Verbal cueing Gait Distance (Feet): 15 Feet Assistive device: Rolling walker Stairs / Additional Locomotion Stairs: No Wheelchair Mobility Wheelchair Mobility: Yes Wheelchair Assistance: Independent with Garment/textile technologist: Both upper extremities Wheelchair Parts Management: Independent Distance: 150'  Trunk/Postural Assessment  Cervical Assessment Cervical Assessment: Within Functional Limits Thoracic Assessment Thoracic Assessment: Within Functional Limits Lumbar Assessment Lumbar Assessment: Exceptions to WFL(posterior pelvic tilt) Postural Control Postural  Control: Within Functional Limits  Balance Balance Balance Assessed: Yes Dynamic Sitting Balance Dynamic Sitting - Level of Assistance: 7: Independent Static Standing Balance Static Standing - Level of Assistance: 6: Modified independent (Device/Increase time) Dynamic Standing Balance Dynamic Standing - Level of Assistance: 5: Stand by assistance Extremity Assessment  RLE Assessment RLE Assessment: Within Functional Limits LLE Assessment LLE Assessment: Exceptions to [facility] Passive Range of Motion (PROM) Comments: Knee ROM limited 2/2 edema and pain, new L BKA General Strength Comments: WFL globally, mild pain w/ testing hamstring and quad but able to hold firm contraction    Adamaris King K Adelfo Diebel 09/30/2018, 10:04 AM

## 2018-09-30 NOTE — Discharge Instructions (Signed)
Inpatient Rehab Discharge Instructions  KARANDEEP RESENDE Discharge date and time: No discharge date for patient encounter.   Activities/Precautions/ Functional Status: Activity: activity as tolerated Diet: diabetic diet Wound Care: keep wound clean and dry Functional status:  ___ No restrictions     ___ Walk up steps independently ___ 24/7 supervision/assistance   ___ Walk up steps with assistance ___ Intermittent supervision/assistance  ___ Bathe/dress independently ___ Walk with walker     _x__ Bathe/dress with assistance ___ Walk Independently    ___ Shower independently ___ Walk with assistance    ___ Shower with assistance ___ No alcohol     ___ Return to work/school ________  COMMUNITY REFERRALS UPON DISCHARGE:   Home Health:   PT     OT       RN  Agency:  [facility]:  [phone] Medical Equipment/Items Ordered:  16"x18" wheelchair with left amputee support pad and basic cushion; 3-in-1  Agency/Supplier:  [employer]      Phone:  [phone]  Special Instructions: No smoking driving or alcohol   My questions have been answered and I understand these instructions. I will adhere to these goals and the provided educational materials after my discharge from the hospital.  Patient/Caregiver Signature _______________________________ Date __________  Clinician Signature _______________________________________ Date __________  Please bring this form and your medication list with you to all your follow-up doctor's appointments.

## 2018-09-30 NOTE — Progress Notes (Signed)
Pt discharge home with family. Belongings and equipment sent with pt. Pt stable at time of discharge. Discharge instructions given by Linna Hoff, PA. No further questions from pt or family.   Gerald Stabs, RN

## 2018-09-30 NOTE — Progress Notes (Signed)
Social Work Discharge Note  The overall goal for the admission was met for:   Discharge location: Yes - to his home  Length of Stay: Yes - 6 days  Discharge activity level: Yes - modified independent  Home/community participation: Yes  Services provided included: MD, RD, PT, OT, RN, Pharmacy and SW  Financial Services: Medicare  Follow-up services arranged: Home Health: PT/OT from [facility], DME: 16"x18" lightweight w/c with amputee support pad; 3-in-1 from [employer] and Patient/Family has no preference for HH/DME agencies  Comments (or additional information): Son came in for family education and feel good about pt's progress.  Kaedin Hicklin - son - [phone]; Wilman Tucker - brother - [phone]  Patient/Family verbalized understanding of follow-up arrangements: Yes  Individual responsible for coordination of the follow-up plan: pt and his son for support and brother for transportation  Confirmed correct DME delivered: Trey Sailors 09/30/2018    Delila Kuklinski, Silvestre Mesi

## 2018-09-30 NOTE — Progress Notes (Signed)
Occupational Therapy Discharge Summary  Patient Details  Name: Thomas Garcia MRN: [id number] Date of Birth: 1940-08-13  Today's Date: 09/30/2018 OT Individual Time: 1005-1035 OT Individual Time Calculation (min): 30 min    OT treatment session focused on pt/family education. Pt propelled wc to tub room and practiced tub bench and toilet transfers in simulated home environment. Son to order tub bench from [facility] for home delivery. Discussed home modifications for safe BADL participation in home environment. Pt left seated in wc at end of session with son present and needs met.   Patient has met 8 of 8 long term goals due to improved activity tolerance, improved balance, postural control and ability to compensate for deficits.  Patient to discharge at overall Modified Independent level.  Patient's care partner is independent to provide the necessary physical assistance at discharge for higher level iADL tasks.   Reasons goals not met: n/a  Recommendation:  Patient will benefit from ongoing skilled OT services in home health setting to continue to advance functional skills in the area of BADL.  Equipment: 3-in-1 BSC, wc, (pt to order tub bench privately)  Reasons for discharge: treatment goals met and discharge from hospital  Patient/family agrees with progress made and goals achieved: Yes  OT Discharge Precautions/Restrictions  Precautions Precautions: None Restrictions Weight Bearing Restrictions: Yes LLE Weight Bearing: Non weight bearing LLE Partial Weight Bearing Percentage or Pounds: BKA Pain Pain Assessment Pain Scale: 0-10 Pain Score: 7   Repositioned ADL ADL Eating: Independent Grooming: Independent Upper Body Bathing: Independent Lower Body Bathing: Modified independent Upper Body Dressing: Independent Lower Body Dressing: Modified independent Toileting: Modified independent Toilet Transfer: Modified independent Tub/Shower Transfer: Modified  independent Vision Baseline Vision/History: No visual deficits Wears Glasses: At all times Patient Visual Report: No change from baseline Vision Assessment?: No apparent visual deficits Perception  Perception: Within Functional Limits Praxis Praxis: Intact Cognition Overall Cognitive Status: Within Functional Limits for tasks assessed Arousal/Alertness: Awake/alert Orientation Level: Oriented X4 Attention: Sustained Sustained Attention: Appears intact Memory: Appears intact Awareness: Appears intact Problem Solving: Appears intact Safety/Judgment: Appears intact Sensation Sensation Light Touch: Impaired by gross assessment(Impaired in all distal extremities at baseline 2/2 neuropathy, impaired at L residual limb incision site) Coordination Gross Motor Movements are Fluid and Coordinated: Yes Fine Motor Movements are Fluid and Coordinated: Yes Motor  Motor Motor: Within Functional Limits Motor - Discharge Observations: Continues to have generalized weakness, but improved since eval Mobility  Bed Mobility Bed Mobility: Rolling Right;Rolling Left;Supine to Sit;Sit to Supine Rolling Right: Independent Rolling Left: Independent Supine to Sit: Independent Sit to Supine: Independent Transfers Sit to Stand: Independent with assistive device Stand to Sit: Independent with assistive device  Trunk/Postural Assessment  Cervical Assessment Cervical Assessment: Within Functional Limits Thoracic Assessment Thoracic Assessment: Within Functional Limits Lumbar Assessment Lumbar Assessment: Exceptions to WFL(posterior pelvic tilt) Postural Control Postural Control: Within Functional Limits  Balance Balance Balance Assessed: Yes Dynamic Sitting Balance Dynamic Sitting - Level of Assistance: 7: Independent Static Standing Balance Static Standing - Level of Assistance: 6: Modified independent (Device/Increase time) Dynamic Standing Balance Dynamic Standing - Level of Assistance:  6: Modified independent (Device/Increase time) Extremity/Trunk Assessment RUE Assessment RUE Assessment: Within Functional Limits LUE Assessment LUE Assessment: Within Functional Limits   Daneen Schick Alexi Geibel 09/30/2018, 12:32 PM

## 2018-09-30 NOTE — Progress Notes (Signed)
Brief note.  Please see discharge summary as well.  Patient sitting up in bed and looking forward to discharge today.  He has questions regarding discharge medications.  Medically stable for discharge.  We will see patient in 1-2 weeks for transitional care management.

## 2018-09-30 NOTE — Patient Care Conference (Signed)
Inpatient RehabilitationTeam Conference and Plan of Care Update Date: 09/30/2018   Time: 1:17 PM    Patient Name: Thomas Garcia      Medical Record Number: [id number]  Date of Birth: 06-01-1940 Sex: Male         Room/Bed: 4M11C/4M11C-01 Payor Info: Payor: MEDICARE / Plan: MEDICARE PART A AND B / Product Type: *No Product type* /    Admitting Diagnosis: 5. Gen Team  Lt. Uni. BKA, PVD; 11-13days  Admit Date/Time:  09/24/2018  2:55 PM Admission Comments: No comment available   Primary Diagnosis:  <principal problem not specified> Principal Problem: <principal problem not specified>  Patient Active Problem List   Diagnosis Date Noted  . Diabetes mellitus type 2 in nonobese (HCC)   . Phantom limb pain ([place])   . Postoperative pain   . Unilateral complete BKA, left, sequela ([place])   . Acute blood loss anemia   . Essential hypertension   . Neuropathic pain   . Unilateral complete BKA, left, initial encounter ([place])   . Tobacco abuse   . Benign essential HTN   . Diabetic peripheral neuropathy ([place])   . Post-operative pain   . 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 PVD (peripheral vascular disease) ([place])   . 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 ([place]) 09/08/2018  . Osteomyelitis of second toe of left foot ([place]) 09/08/2018  . Cellulitis of left foot 09/08/2018  . Leukocytosis 09/08/2018  . Fever and chills 09/08/2018  . MOLE 10/26/2008  . 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 OVERWEIGHT 10/26/2008  . NICOTINE ADDICTION 10/26/2008  . ACUTE CYSTITIS 10/26/2008  . FATIGUE 10/26/2008  . COUGH 10/26/2008  . ELECTROCARDIOGRAM, ABNORMAL 10/26/2008    Expected Discharge Date: Expected Discharge Date: 09/30/18  Team Members Present: Physician leading conference: Dr. Delice Lesch Social Worker Present: Alfonse Alpers, LCSW Nurse Present: Mohammed Kindle, LPN PT Present: Burnard Bunting, PT OT Present: Cherylynn Ridges, OT PPS Coordinator present : Gunnar Fusi, SLP   Current Status/Progress Goal Weekly Team Focus  Medical   Decreased functional mobility secondary to left BKA 09/22/2018.  Improve mobility, pain, HTN/DM  See above   Bowel/Bladder   Continent of bowel and bladder. LBM 09/29/2018  remain continent of bowel and bladder. maintain regular bowel pattern  Assess bowel and bladder function every shift. report abnormal findings.   Swallow/Nutrition/ Hydration             ADL's   mod I  Mod I  dc planning, pt/family education   Mobility   mod/i bed mobility and transfers, supervision gait short distance w/ RW (~15')  mod/i overall, however son able to provide supervision for household gait when necessary  discharge planning, amputee education   Communication             Safety/Cognition/ Behavioral Observations            Pain   Pain at incision site. worsen with activity. PRN pain med available and utilized prior to activity.  pain level less that 3 on scale of 0-10  Assess pain every shift. Administer pain med as needed.   Skin   Incision to left leg BKA site  Maintain skin integrity, prevent infection at incision site.  Assess skin every shift. Dressing change per order. Report abnormal findings.    Rehab Goals Patient on target to meet rehab goals: Yes Rehab Goals Revised: none *See Care Plan and progress notes for long and short-term goals.  Barriers to Discharge  Current Status/Progress Possible Resolutions Date Resolved   Physician    Medical stability;Wound Care;Weight bearing restrictions     See above  Therapies, optimize DM/HTN/Pain meds, making good progress- d/c today      Nursing                  PT                    OT                  SLP                SW                Discharge Planning/Teaching Needs:  Pt to d/c home at mod I level with son and brother to assist as needed.  Pt's son came in for family education.   Team Discussion:  Pt has met mod I goals and son came for family education.  Ramp  built by son.  Son and brother to assist pt, as needed.  DME delivered to room and home health therapies arranged.  Dr. Posey Pronto has cleared pt for d/c today.  Revisions to Treatment Plan:  none    Continued Need for Acute Rehabilitation Level of Care: The patient requires daily medical management by a physician with specialized training in physical medicine and rehabilitation for the following conditions: Daily analysis of laboratory values and/or radiology reports with any subsequent need for medication adjustment of medical intervention for : Post surgical problems;Diabetes problems;Wound care problems;Blood pressure problems   I attest that I was present, lead the team conference, and concur with the assessment and plan of the team.  This was an informal conference among the team members due to pt not being admitted to Rehab over a scheduled team conference day.   Lavan Imes, Silvestre Mesi 09/30/2018, 1:17 PM

## 2018-10-01 ENCOUNTER — Telehealth: Payer: Self-pay | Admitting: Registered Nurse

## 2018-10-01 NOTE — Telephone Encounter (Signed)
Transitional Care call  Patient name: Thomas Garcia  DOB: 05-11-1940 1. Are you/is patient experiencing any problems since coming home? No a. Are there any questions regarding any aspect of care? No 2. Are there any questions regarding medications administration/dosing? No a. Are meds being taken as prescribed? Yes b. "Patient should review meds with caller to confirm" Medication List Reviewed 3. Have there been any falls? No 4. Has Home Health been to the house and/or have they contacted you? No a. If not, have you tried to contact them? No b. Can we help you contact them? Yes, [facility] was called, they will be calling Mr. Nienhuis by 10/02/2018, office staff states. Mr. Lucarelli called and I spoke with his son Legrand Como. 5. Are bowels and bladder emptying properly? Yes a. Are there any unexpected incontinence issues? No b. If applicable, is patient following bowel/bladder programs? NA 6. Any fevers, problems with breathing, unexpected pain? No 7. Are there any skin problems or new areas of breakdown? No 8. Has the patient/family member arranged specialty MD follow up (ie cardiology/neurology/renal/surgical/etc.)?  Mr. Lua and his son Legrand Como was instructed to call Dr. Carlis Abbott for [place] appointment, they verbalize understanding.  a. Can we help arrange? No 9. Does the patient need any other services or support that we can help arrange? No 10. Are caregivers following through as expected in assisting the patient? Yes 11. Has the patient quit smoking, drinking alcohol, or using drugs as recommended? Mr. Whack states he hasn't smoke since hospitalization, denies drinking alcohol or using illicit drugs.   Appointment date/time 10/09/2018,  arrival time 1:40 for 2:00 appointment with Dr. Posey Pronto. At [facility]

## 2018-10-02 NOTE — Discharge Summary (Signed)
Discharge Summary    Thomas Garcia [birth year] 78 y.o. male  [id number]  Admission Date: 09/17/2018  Discharge Date: 09/24/2018  Physician: No att. providers found  Admission Diagnosis: PVD (peripheral vascular disease) ([place]) [I73.9]   HPI:   This is a 78 y.o. 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  He underwent toe amputations of his left second and third toes that have been nonhealing and now appear necrotic.  In addition he had a fairly frank cellulitis of the foot with a fair amount of tenderness on the plantar surface.  He underwent revascularization yesterday with left SFA and anterior tibial artery angioplasty.  He presents today for TMA versus BKA after risks and benefits were discussed.  Hospital Course:  The patient was admitted to the hospital and taken to the operating room on 09/18/2018 and underwent: 1.  Left transmetatarsal amputation (wound left open) 2.  Negative pressure wound VAC placement to left transmetatarsal amputation    Findings: [place] incision was made with left transmetatarsal amputation.  We found a large abscess cavity on the plantar surface of the foot where he has been having a lot of tenderness and some of the underlying muscle here was necrotic.  Ultimately I revised the transmetatarsal amputation back further and debrided significant amount of tissue on the plantar surface flap until everything appeared healthy.  There was excellent pulsatile bleeding.  Negative pressure wound VAC was placed.  Discussed I will bring him back on Monday for delayed primary closure versus BKA if the flap fails over the weekend.  The pt tolerated the procedure well and was transported to the PACU in good condition.   Later that day, MD called to see pt for persistent bleeding.  All dressings removed and there were 2 areas that were directly oozing and there was some clot present.  [place] tightly wrapped  followed by ace wrap.    Wet to dry dressing were continued through the weekend.    On 09/22/2018, pt was taken to the operating room for washout and closure of left TMA vs BKA.   Unfortunately, the plantar flap was necrotic and appeared nonviable.  I did not think there was enough soft tissue to shorten his TMA and make another attempt to revise the TMA.  I discussed with him converting to a below-knee amputation if the tissue was not viable prior to surgery and patient was amenable to that plan.  Left below knee amputation tissue all appeared viable.  His IV abx were continued for 48 hrs post op.    On POD 2, pt wound was clean with some bruising medially.  He was discharged to CIR.   The remainder of the hospital course consisted of increasing mobilization and increasing intake of solids without difficulty.  CBC    Component Value Date/Time   WBC 10.3 09/25/2018 0748   RBC 2.71 (L) 09/25/2018 0748   HGB 7.9 (L) 09/25/2018 0748   HCT 24.8 (L) 09/25/2018 0748   PLT 589 (H) 09/25/2018 0748   MCV 91.5 09/25/2018 0748   MCH 29.2 09/25/2018 0748   MCHC 31.9 09/25/2018 0748   RDW 15.2 09/25/2018 0748   LYMPHSABS 2.7 09/25/2018 0748   MONOABS 1.1 (H) 09/25/2018 0748   EOSABS 0.2 09/25/2018 0748   BASOSABS 0.1 09/25/2018 0748    BMET    Component Value Date/Time   NA 140 09/25/2018 0748   K 3.5 09/25/2018 0748   CL  106 09/25/2018 0748   CO2 25 09/25/2018 0748   GLUCOSE 170 (H) 09/25/2018 0748   BUN 9 09/25/2018 0748   CREATININE 0.81 09/25/2018 0748   CALCIUM 8.4 (L) 09/25/2018 0748   GFRNONAA >60 09/25/2018 0748   GFRAA >60 09/25/2018 0748      Discharge Instructions    Discharge patient   Complete by: As directed    Discharge to CIR   Discharge disposition: [employer] Not Defined   Discharge patient date: 09/24/2018      Discharge Diagnosis:  PVD (peripheral vascular disease) ([place]) [I73.9]  Secondary Diagnosis: Patient Active Problem List    Diagnosis Date Noted  . Diabetes mellitus type 2 in nonobese (HCC)   . Phantom limb pain ([place])   . Postoperative pain   . Unilateral complete BKA, left, sequela ([place])   . Acute blood loss anemia   . Essential hypertension   . Neuropathic pain   . Unilateral complete BKA, left, initial encounter ([place])   . Tobacco abuse   . Benign essential HTN   . Diabetic peripheral neuropathy ([place])   . Post-operative pain   . 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 PVD (peripheral vascular disease) ([place])   . 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 ([place]) 09/08/2018  . Osteomyelitis of second toe of left foot ([place]) 09/08/2018  . Cellulitis of left foot 09/08/2018  . Leukocytosis 09/08/2018  . Fever and chills 09/08/2018  . MOLE 10/26/2008  . 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 OVERWEIGHT 10/26/2008  . NICOTINE ADDICTION 10/26/2008  . ACUTE CYSTITIS 10/26/2008  . FATIGUE 10/26/2008  . COUGH 10/26/2008  . ELECTROCARDIOGRAM, ABNORMAL 10/26/2008   Past Medical History:  Diagnosis Date  . Diabetes mellitus without complication ([place])   . Kidney stones   . PONV (postoperative nausea and vomiting)      Allergies as of 09/24/2018   No Known Allergies     Medication List    STOP taking these medications   sulfamethoxazole-trimethoprim 800-160 MG tablet Commonly known as: BACTRIM DS     TAKE these medications   aspirin 81 MG EC tablet Take 1 tablet (81 mg total) by mouth daily.       Prescriptions given: Asa 81mg  daily  Disposition: CIR  Patient's condition: is Good  Follow up: 1. Dr. Carlis Abbott in 4 weeks   Leontine Locket, PA-C [facility] [phone] 10/02/2018  12:07 PM

## 2018-10-08 ENCOUNTER — Telehealth: Payer: Self-pay

## 2018-10-08 NOTE — Telephone Encounter (Signed)
[place], OT from [facility] called requesting verbal orders for HHOT 1wk2, 2wk1, 1wk2. Orders approved and given per discharge summary.

## 2018-10-09 ENCOUNTER — Encounter: Payer: Self-pay | Admitting: Physical Medicine & Rehabilitation

## 2018-10-09 ENCOUNTER — Other Ambulatory Visit: Payer: Self-pay

## 2018-10-09 ENCOUNTER — Encounter: Payer: Medicare Other | Attending: Physical Medicine & Rehabilitation | Admitting: Physical Medicine & Rehabilitation

## 2018-10-09 VITALS — BP 134/60 | HR 91 | Temp 98.3°F | Ht 69.0 in | Wt 180.0 lb

## 2018-10-09 DIAGNOSIS — M792 Neuralgia and neuritis, unspecified: Secondary | ICD-10-CM | POA: Insufficient documentation

## 2018-10-09 DIAGNOSIS — E119 Type 2 diabetes mellitus without complications: Secondary | ICD-10-CM | POA: Insufficient documentation

## 2018-10-09 DIAGNOSIS — G546 Phantom limb syndrome with pain: Secondary | ICD-10-CM | POA: Diagnosis present

## 2018-10-09 DIAGNOSIS — S88112S Complete traumatic amputation at level between knee and ankle, left lower leg, sequela: Secondary | ICD-10-CM | POA: Diagnosis not present

## 2018-10-09 DIAGNOSIS — Z720 Tobacco use: Secondary | ICD-10-CM | POA: Diagnosis present

## 2018-10-09 DIAGNOSIS — E1142 Type 2 diabetes mellitus with diabetic polyneuropathy: Secondary | ICD-10-CM | POA: Insufficient documentation

## 2018-10-09 DIAGNOSIS — G8918 Other acute postprocedural pain: Secondary | ICD-10-CM

## 2018-10-09 DIAGNOSIS — I10 Essential (primary) hypertension: Secondary | ICD-10-CM | POA: Insufficient documentation

## 2018-10-09 DIAGNOSIS — R269 Unspecified abnormalities of gait and mobility: Secondary | ICD-10-CM | POA: Diagnosis present

## 2018-10-09 MED ORDER — GABAPENTIN 400 MG PO CAPS: 400.0000 mg | ORAL_CAPSULE | Freq: Three times a day (TID) | ORAL | 1 refills | Status: DC

## 2018-10-09 MED ORDER — SULFAMETHOXAZOLE-TRIMETHOPRIM 800-160 MG PO TABS: 1.0000 | ORAL_TABLET | Freq: Two times a day (BID) | ORAL | 0 refills | Status: AC

## 2018-10-09 NOTE — Progress Notes (Signed)
Subjective:    Patient ID: Thomas Garcia, male    DOB: 08-10-1940, 78 y.o.   MRN: [id number]  HPI 78 year old right-handed male history of diabetes mellitus tobacco abuse and peripheral vascular disease presents for transitional care management after receiving CIR for left BKA.    Son provides history. At discharge, he was instructed to follow up with Vascular, with whom he has an appointment. He has not established with PCP. Continues to take Gabapentin. He has not been checking CBGs. BP is controlled.  He is smoking 1 cig/day.  Therapies: 2/week DME: Bedside commode Mobility: Wheelchair at all times.  Pain Inventory Average Pain 7 Pain Right Now 0 My pain is na  In the last 24 hours, has pain interfered with the following? General activity 7 Relation with others 6 Enjoyment of life 8 What TIME of day is your pain at its worst? evening Sleep (in general) Poor  Pain is worse with: unsure Pain improves with: medication Relief from Meds: 8  Mobility use a walker ability to climb steps?  no do you drive?  no  Function retired I need assistance with the following:  meal prep, household duties and shopping  Neuro/Psych weakness numbness tremor tingling trouble walking confusion depression  Prior Studies Any changes since last visit?  no  Physicians involved in your care Any changes since last visit?  no   No family history on file. Social History   Socioeconomic History  . Marital status: Divorced    Spouse name: Not on file  . Number of children: Not on file  . Years of education: Not on file  . Highest education level: Not on file  Occupational History  . Not on file  Social Needs  . Financial resource strain: Not on file  . Food insecurity    Worry: Not on file    Inability: Not on file  . Transportation needs    Medical: Not on file    Non-medical: Not on file  Tobacco Use  . Smoking status: Current Every Day Smoker    Packs/day: 1.00   Years: 45.00    Pack years: 45.00    Types: Cigarettes  . Smokeless tobacco: Never Used  Substance and Sexual Activity  . Alcohol use: No  . Drug use: No  . Sexual activity: Not on file  Lifestyle  . Physical activity    Days per week: Not on file    Minutes per session: Not on file  . Stress: Not on file  Relationships  . Social Herbalist on phone: Not on file    Gets together: Not on file    Attends religious service: Not on file    Active member of club or organization: Not on file    Attends meetings of clubs or organizations: Not on file    Relationship status: Not on file  Other Topics Concern  . Not on file  Social History Narrative  . Not on file   Past Surgical History:  Procedure Laterality Date  . ABDOMINAL AORTOGRAM W/LOWER EXTREMITY N/A 09/17/2018   Procedure: ABDOMINAL AORTOGRAM W/LOWER EXTREMITY;  Surgeon: Marty Heck, MD;  Location: [facility];  Service: Cardiovascular;  Laterality: N/A;  . AMPUTATION Left 09/22/2018   Procedure: AMPUTATION BELOW KNEE LEFT;  Surgeon: Marty Heck, MD;  Location: [place];  Service: Vascular;  Laterality: Left;  . AMPUTATION TOE Left 09/10/2018   Procedure: AMPUTATION OF THIRD TOE LEFT FOOT, DEBRIDEMENT OF  ULCERS ON SECOND TOE AND PARTIAL AMPUTATION SECOND TOE ON LEFT FOOT;  Surgeon: Virl Cagey, MD;  Location: [facility];  Service: General;  Laterality: Left;  . APPLICATION OF WOUND VAC Left 09/18/2018   Procedure: APPLICATION OF WOUND VAC;  Surgeon: Marty Heck, MD;  Location: [place] [facility];  Service: Vascular;  Laterality: Left;  . CYSTOSCOPY  05/08/2012   Procedure: CYSTOSCOPY;  Surgeon: Marissa Nestle, MD;  Location: [facility];  Service: Urology;  Laterality: N/A;  Evacuation of clots  . KIDNEY SURGERY     sutures   . PERIPHERAL VASCULAR BALLOON ANGIOPLASTY  09/17/2018   Procedure: PERIPHERAL VASCULAR BALLOON ANGIOPLASTY;  Surgeon: Marty Heck, MD;  Location: [place];   Service: Cardiovascular;;  LT SFA and AT  . SPLENECTOMY    . TRANSMETATARSAL AMPUTATION Left 09/18/2018   Procedure: TRANSMETATARSAL AMPUTATION LEFT FOOT;  Surgeon: Marty Heck, MD;  Location: [facility];  Service: Vascular;  Laterality: Left;  . TRANSURETHRAL RESECTION OF PROSTATE  05/14/2012   Procedure: TRANSURETHRAL RESECTION OF THE PROSTATE (TURP);  Surgeon: Marissa Nestle, MD;  Location: [facility];  Service: Urology;  Laterality: N/A;   Past Medical History:  Diagnosis Date  . Diabetes mellitus without complication ([place])   . Kidney stones   . PONV (postoperative nausea and vomiting)    BP 134/60   Pulse 91   Temp 98.3 F (36.8 C)   SpO2 94%   Opioid Risk Score:   Fall Risk Score:  `1  Depression screen PHQ 2/9  No flowsheet data found.   Review of Systems  Constitutional: Positive for unexpected weight change.  HENT: Negative.   Eyes: Negative.   Respiratory: Negative.   Cardiovascular: Negative.   Gastrointestinal: Negative.   Endocrine: Negative.   Genitourinary: Negative.   Musculoskeletal: Positive for gait problem and joint swelling.  Skin: Negative.   Allergic/Immunologic: Negative.   Neurological: Positive for tremors, weakness and numbness.  Hematological: Negative.   Psychiatric/Behavioral: Positive for confusion.  All other systems reviewed and are negative.      Objective:   Physical Exam Constitutional: No distress . Vital signs reviewed. HENT: Normocephalic.  Atraumatic. Eyes: EOMI. No discharge. Cardiovascular: No JVD. Respiratory: Normal  effort. GI: Non-distended. Musc: Left BKA with edema and tenderness, improving RLE with edema and cellulitis Neurological: He is alert.  Follows commands.   Motor: Bilateral upper extremities, right lower extremity: 5/5 proximal to distal Left lower extremity: 5/5 hip flexion Skin: BKA with staples healing  Foul odor and ulcer in right heel Psychiatric: Anxious    Assessment & Plan:  78 year old  right-handed male history of diabetes mellitus tobacco abuse and peripheral vascular disease presents for transitional care management after receiving CIR for left BKA.    1.  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   Cont therapies  Follow up with Vascular  2. Pain Management/phantom limb pain:              Neurontin 400mg  TID reordered             Oxycodone as needed             Controlled on 6/22  3.  Diabetes mellitus with peripheral neuropathy.    Patient has not been checking  Encouraged monitoring  Needs to establish with PCP  4. Essential hypertension.    Cont meds  Controlled at present  5.  Tobacco abuse.    Encouraged abstinence  6. Gait abnormality  Cont therapies  Wheelchair for safety  7. Right heel ulcer with cellulitis  Bactim x14 days ordered  Encouraged off-loading

## 2018-10-17 ENCOUNTER — Encounter (HOSPITAL_COMMUNITY): Payer: Self-pay | Admitting: Vascular Surgery

## 2018-10-20 DIAGNOSIS — I10 Essential (primary) hypertension: Secondary | ICD-10-CM

## 2018-10-20 DIAGNOSIS — Z7984 Long term (current) use of oral hypoglycemic drugs: Secondary | ICD-10-CM

## 2018-10-20 DIAGNOSIS — E1151 Type 2 diabetes mellitus with diabetic peripheral angiopathy without gangrene: Secondary | ICD-10-CM

## 2018-10-20 DIAGNOSIS — D509 Iron deficiency anemia, unspecified: Secondary | ICD-10-CM

## 2018-10-20 DIAGNOSIS — Z89512 Acquired absence of left leg below knee: Secondary | ICD-10-CM

## 2018-10-20 DIAGNOSIS — E1142 Type 2 diabetes mellitus with diabetic polyneuropathy: Secondary | ICD-10-CM

## 2018-10-20 DIAGNOSIS — E785 Hyperlipidemia, unspecified: Secondary | ICD-10-CM

## 2018-10-20 DIAGNOSIS — F17210 Nicotine dependence, cigarettes, uncomplicated: Secondary | ICD-10-CM

## 2018-10-20 DIAGNOSIS — G546 Phantom limb syndrome with pain: Secondary | ICD-10-CM

## 2018-10-20 DIAGNOSIS — Z4781 Encounter for orthopedic aftercare following surgical amputation: Secondary | ICD-10-CM

## 2018-10-20 DIAGNOSIS — Z4800 Encounter for change or removal of nonsurgical wound dressing: Secondary | ICD-10-CM

## 2018-11-03 ENCOUNTER — Telehealth (HOSPITAL_COMMUNITY): Payer: Self-pay | Admitting: Rehabilitation

## 2018-11-03 NOTE — Telephone Encounter (Signed)

## 2018-11-04 ENCOUNTER — Other Ambulatory Visit: Payer: Self-pay

## 2018-11-04 ENCOUNTER — Ambulatory Visit (INDEPENDENT_AMBULATORY_CARE_PROVIDER_SITE_OTHER): Payer: Self-pay | Admitting: Vascular Surgery

## 2018-11-04 ENCOUNTER — Encounter: Payer: Self-pay | Admitting: Vascular Surgery

## 2018-11-04 VITALS — BP 130/59 | HR 100 | Temp 98.7°F | Resp 18 | Ht 69.0 in | Wt 180.0 lb

## 2018-11-04 DIAGNOSIS — I739 Peripheral vascular disease, unspecified: Secondary | ICD-10-CM

## 2018-11-04 MED ORDER — AMOXICILLIN-POT CLAVULANATE 875-125 MG PO TABS: 1.0000 | ORAL_TABLET | Freq: Two times a day (BID) | ORAL | 0 refills | Status: AC

## 2018-11-04 NOTE — Progress Notes (Signed)
Patient name: DEL OVERFELT MRN: [id number] DOB: Jun 10, 1940 Sex: male  REASON FOR VISIT: Post-op check  HPI: Thomas Garcia is a 78 y.o. 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  Ultimately performed left lower extremity intervention and then a TMA.  Unfortunately this failed to heal and we had to convert him to a BKA.  On follow-up today states his pain is doing much better in the left leg after below-knee amputation.  He still has the staples in.  Little bit of redness between the staples.  No fevers or chills at home.  No drainage.  Apparently had developed a wound on his right heel which is the opposite leg but both him and his son both state that this has healed over the last week.  Past Medical History:  Diagnosis Date  . Diabetes mellitus without complication ([place])   . Kidney stones   . PONV (postoperative nausea and vomiting)     Past Surgical History:  Procedure Laterality Date  . ABDOMINAL AORTOGRAM W/LOWER EXTREMITY N/A 09/17/2018   Procedure: ABDOMINAL AORTOGRAM W/LOWER EXTREMITY;  Surgeon: Marty Heck, MD;  Location: [facility];  Service: Cardiovascular;  Laterality: N/A;  . AMPUTATION Left 09/22/2018   Procedure: AMPUTATION BELOW KNEE LEFT;  Surgeon: Marty Heck, MD;  Location: [facility];  Service: Vascular;  Laterality: Left;  . AMPUTATION TOE Left 09/10/2018   Procedure: AMPUTATION OF THIRD TOE LEFT FOOT, DEBRIDEMENT OF ULCERS ON SECOND TOE AND PARTIAL AMPUTATION SECOND TOE ON LEFT FOOT;  Surgeon: Virl Cagey, MD;  Location: [facility];  Service: General;  Laterality: Left;  . APPLICATION OF WOUND VAC Left 09/18/2018   Procedure: APPLICATION OF WOUND VAC;  Surgeon: Marty Heck, MD;  Location: [facility];  Service: Vascular;  Laterality: Left;  . CYSTOSCOPY  05/08/2012   Procedure: CYSTOSCOPY;  Surgeon: Marissa Nestle, MD;  Location: [facility];  Service: Urology;   Laterality: N/A;  Evacuation of clots  . KIDNEY SURGERY     sutures   . PERIPHERAL VASCULAR BALLOON ANGIOPLASTY  09/17/2018   Procedure: PERIPHERAL VASCULAR BALLOON ANGIOPLASTY;  Surgeon: Marty Heck, MD;  Location: [facility];  Service: Cardiovascular;;  LT SFA and AT  . SPLENECTOMY    . TRANSMETATARSAL AMPUTATION Left 09/18/2018   Procedure: TRANSMETATARSAL AMPUTATION LEFT FOOT;  Surgeon: Marty Heck, MD;  Location: [facility];  Service: Vascular;  Laterality: Left;  . TRANSURETHRAL RESECTION OF PROSTATE  05/14/2012   Procedure: TRANSURETHRAL RESECTION OF THE PROSTATE (TURP);  Surgeon: Marissa Nestle, MD;  [facility]: [facility];  Service: Urology;  Laterality: N/A;    Family History  Problem Relation Age of Onset  . Diabetes Mother     SOCIAL HISTORY: Social History   Tobacco Use  . Smoking status: Current Every Day Smoker    Packs/day: 1.00    Years: 45.00    Pack years: 45.00    Types: Cigarettes  . Smokeless tobacco: Never Used  Substance Use Topics  . Alcohol use: No    No Known Allergies  Current Outpatient Medications  Medication Sig Dispense Refill  . acetaminophen (TYLENOL) 325 MG tablet Take 2 tablets (650 mg total) by mouth every 4 (four) hours as needed for headache or mild pain.    Marland Kitchen atorvastatin (LIPITOR) 10 MG tablet Take 1 tablet (10 mg total) by mouth daily at 6 PM. 30 tablet 0  .  gabapentin (NEURONTIN) 400 MG capsule Take 1 capsule (400 mg total) by mouth 3 (three) times daily. 90 capsule 1  . metFORMIN (GLUCOPHAGE) 850 MG tablet Take 1 tablet (850 mg total) by mouth 2 (two) times daily with a meal. 60 tablet 1  . amoxicillin-clavulanate (AUGMENTIN) 875-125 MG tablet Take 1 tablet by mouth 2 (two) times daily for 7 days. 14 tablet 0  . aspirin EC 81 MG EC tablet Take 1 tablet (81 mg total) by mouth daily. (Patient not taking: Reported on 11/04/2018)    . clopidogrel (PLAVIX) 75 MG tablet Take 1 tablet (75 mg total) by mouth daily. 30 tablet 0   . oxyCODONE-acetaminophen (PERCOCET/ROXICET) 5-325 MG tablet Take 1-2 tablets by mouth every 4 (four) hours as needed for severe pain. (Patient not taking: Reported on 11/04/2018) 20 tablet 0  . pantoprazole (PROTONIX) 40 MG tablet Take 1 tablet (40 mg total) by mouth daily. (Patient not taking: Reported on 11/04/2018) 30 tablet 0   No current facility-administered medications for this visit.     REVIEW OF SYSTEMS:  [X]  denotes positive finding, [ ]  denotes negative finding Cardiac  Comments:  Chest pain or chest pressure:    Shortness of breath upon exertion:    Short of breath when lying flat:    Irregular heart rhythm:        Vascular    Pain in calf, thigh, or hip brought on by ambulation:    Pain in feet at night that wakes you up from your sleep:     Blood clot in your veins:    Leg swelling:         Pulmonary    Oxygen at home:    Productive cough:     Wheezing:         Neurologic    Sudden weakness in arms or legs:     Sudden numbness in arms or legs:     Sudden onset of difficulty speaking or slurred speech:    Temporary loss of vision in one eye:     Problems with dizziness:         Gastrointestinal    Blood in stool:     Vomited blood:         Genitourinary    Burning when urinating:     Blood in urine:        Psychiatric    Major depression:         Hematologic    Bleeding problems:    Problems with blood clotting too easily:        Skin    Rashes or ulcers:        Constitutional    Fever or chills:      PHYSICAL EXAM: Vitals:   11/04/18 1536  BP: (!) 130/59  Pulse: 100  Resp: 18  Temp: 98.7 F (37.1 C)  TempSrc: Temporal  SpO2: 99%  Weight: 180 lb (81.6 kg)  Height: 5\' 9"  (1.753 m)    GENERAL: The patient is a well-nourished male, in no acute distress. The vital signs are documented above. CARDIAC: There is a regular rate and rhythm.  VASCULAR:  Left BKA - some erythema - staples in place - no drainage Right foot with scab on lateral  ankle healing  DATA:   None  Assessment/Plan:  78 year old male presents for postop check after left BKA on 09/22/2018 for a critical ischemia with tissue loss and nonhealing TMA.  On evaluation today he has little bit of  cellulitis between the staples.  I did prescribe him a course of Augmentin for 1 week.  We removed every other staple.  I will have him follow-up next week in the [facility] clinic for one more wound check before the staples are completely removed.  Discussed if this wound breaks down he potentially would require conversion to a higher level amputation or above-knee amputation.  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  Discussed with him and his son that if he has any progressive tissue loss in the right foot we should proceed with arteriogram and possible intervention on the right leg.  Both him and his son state they will let us know if he has any new wounds or ankle wound that is healing starts breaking down.   Marty Heck, MD [facility] Office: [phone] Pager: [phone]

## 2018-11-06 ENCOUNTER — Encounter: Payer: Self-pay | Admitting: Physical Medicine & Rehabilitation

## 2018-11-06 ENCOUNTER — Encounter (HOSPITAL_BASED_OUTPATIENT_CLINIC_OR_DEPARTMENT_OTHER): Payer: Medicare Other | Admitting: Physical Medicine & Rehabilitation

## 2018-11-06 ENCOUNTER — Other Ambulatory Visit: Payer: Self-pay

## 2018-11-06 VITALS — BP 120/70 | HR 103 | Temp 98.6°F

## 2018-11-06 DIAGNOSIS — Z720 Tobacco use: Secondary | ICD-10-CM

## 2018-11-06 DIAGNOSIS — S88112S Complete traumatic amputation at level between knee and ankle, left lower leg, sequela: Secondary | ICD-10-CM | POA: Diagnosis present

## 2018-11-06 DIAGNOSIS — I10 Essential (primary) hypertension: Secondary | ICD-10-CM | POA: Diagnosis present

## 2018-11-06 DIAGNOSIS — R269 Unspecified abnormalities of gait and mobility: Secondary | ICD-10-CM

## 2018-11-06 DIAGNOSIS — E119 Type 2 diabetes mellitus without complications: Secondary | ICD-10-CM | POA: Diagnosis present

## 2018-11-06 DIAGNOSIS — E1142 Type 2 diabetes mellitus with diabetic polyneuropathy: Secondary | ICD-10-CM | POA: Diagnosis present

## 2018-11-06 DIAGNOSIS — M792 Neuralgia and neuritis, unspecified: Secondary | ICD-10-CM

## 2018-11-06 DIAGNOSIS — G546 Phantom limb syndrome with pain: Secondary | ICD-10-CM

## 2018-11-06 DIAGNOSIS — G8918 Other acute postprocedural pain: Secondary | ICD-10-CM | POA: Diagnosis present

## 2018-11-06 MED ORDER — ASPIRIN 81 MG PO TBEC: 81.0000 mg | DELAYED_RELEASE_TABLET | Freq: Every day | ORAL | Status: DC

## 2018-11-06 NOTE — Progress Notes (Signed)
Subjective:    Patient ID: Thomas Garcia, male    DOB: 01-19-1941, 78 y.o.   MRN: [id number]  HPI Right-handed male history of diabetes mellitus tobacco abuse and peripheral vascular disease presents for transitional care management after receiving CIR for left BKA.    Son provides history. Last clinic visit on 10/09/2018.  Since that time, pt was seen by Vascular, notes reviewed, 1 week course of Augmentin ordered. OT completed, still in PT. He sees Vascular next week for removal of remainder of staples.  He is taking Gabapentin.  He is scheduled to see PCP in 2 weeks and plans on getting glucometer at that time.  BP is controlled. Denies falls. Smoking 5-6 cig/day.  Pain Inventory Average Pain 2 Pain Right Now 1 My pain is na  In the last 24 hours, has pain interfered with the following? General activity 2 Relation with others 2 Enjoyment of life 1 What TIME of day is your pain at its worst? night Sleep (in general) Fair  Pain is worse with: unsure Pain improves with: medication Relief from Meds: 8  Mobility use a walker ability to climb steps?  no do you drive?  no  Function retired I need assistance with the following:  meal prep, household duties and shopping  Neuro/Psych weakness numbness tremor tingling trouble walking confusion depression  Prior Studies Any changes since last visit?  no  Physicians involved in your care Any changes since last visit?  no   Family History  Problem Relation Age of Onset  . Diabetes Mother    Social History   Socioeconomic History  . Marital status: Legally Separated    Spouse name: Not on file  . Number of children: Not on file  . Years of education: Not on file  . Highest education level: Not on file  Occupational History  . Not on file  Social Needs  . Financial resource strain: Not on file  . Food insecurity    Worry: Not on file    Inability: Not on file  . Transportation needs    Medical: Not on file     Non-medical: Not on file  Tobacco Use  . Smoking status: Current Every Day Smoker    Packs/day: 1.00    Years: 45.00    Pack years: 45.00    Types: Cigarettes  . Smokeless tobacco: Never Used  Substance and Sexual Activity  . Alcohol use: No  . Drug use: No  . Sexual activity: Not on file  Lifestyle  . Physical activity    Days per week: Not on file    Minutes per session: Not on file  . Stress: Not on file  Relationships  . Social Herbalist on phone: Not on file    Gets together: Not on file    Attends religious service: Not on file    Active member of club or organization: Not on file    Attends meetings of clubs or organizations: Not on file    Relationship status: Not on file  Other Topics Concern  . Not on file  Social History Narrative  . Not on file   Past Surgical History:  Procedure Laterality Date  . ABDOMINAL AORTOGRAM W/LOWER EXTREMITY N/A 09/17/2018   Procedure: ABDOMINAL AORTOGRAM W/LOWER EXTREMITY;  Surgeon: Marty Heck, MD;  Location: [facility];  Service: Cardiovascular;  Laterality: N/A;  . AMPUTATION Left 09/22/2018   Procedure: AMPUTATION BELOW KNEE LEFT;  Surgeon: Carlis Abbott,  Gwenyth Allegra, MD;  Location: [facility];  Service: Vascular;  Laterality: Left;  . AMPUTATION TOE Left 09/10/2018   Procedure: AMPUTATION OF THIRD TOE LEFT FOOT, DEBRIDEMENT OF ULCERS ON SECOND TOE AND PARTIAL AMPUTATION SECOND TOE ON LEFT FOOT;  Surgeon: Virl Cagey, MD;  Location: [facility];  Service: General;  Laterality: Left;  . APPLICATION OF WOUND VAC Left 09/18/2018   Procedure: APPLICATION OF WOUND VAC;  Surgeon: Marty Heck, MD;  Location: [facility];  Service: Vascular;  Laterality: Left;  . CYSTOSCOPY  05/08/2012   Procedure: CYSTOSCOPY;  Surgeon: Marissa Nestle, MD;  Location: [facility];  Service: Urology;  Laterality: N/A;  Evacuation of clots  . KIDNEY SURGERY     sutures   . PERIPHERAL VASCULAR BALLOON ANGIOPLASTY  09/17/2018   Procedure:  PERIPHERAL VASCULAR BALLOON ANGIOPLASTY;  Surgeon: Marty Heck, MD;  Location: [facility];  Service: Cardiovascular;;  LT SFA and AT  . SPLENECTOMY    . TRANSMETATARSAL AMPUTATION Left 09/18/2018   Procedure: TRANSMETATARSAL AMPUTATION LEFT FOOT;  Surgeon: Marty Heck, MD;  Location: [facility];  Service: Vascular;  Laterality: Left;  . TRANSURETHRAL RESECTION OF PROSTATE  05/14/2012   Procedure: TRANSURETHRAL RESECTION OF THE PROSTATE (TURP);  Surgeon: Marissa Nestle, MD;  [facility];  Service: Urology;  Laterality: N/A;   Past Medical History:  Diagnosis Date  . Diabetes mellitus without complication ([place])   . Kidney stones   . PONV (postoperative nausea and vomiting)    BP 120/70   Pulse (!) 103   Temp 98.6 F (37 C)   SpO2 98%   Opioid Risk Score:   Fall Risk Score:  `1  Depression screen PHQ 2/9  No flowsheet data found.   Review of Systems  Constitutional: Positive for unexpected weight change.  HENT: Negative.   Eyes: Negative.   Respiratory: Negative.   Cardiovascular: Negative.   Gastrointestinal: Negative.   Endocrine: Negative.   Genitourinary: Negative.   Musculoskeletal: Positive for gait problem and joint swelling.  Skin: Negative.   Allergic/Immunologic: Negative.   Neurological: Positive for tremors, weakness and numbness.  Hematological: Negative.   Psychiatric/Behavioral: Positive for confusion.  All other systems reviewed and are negative.      Objective:   Physical Exam Constitutional: No distress . Vital signs reviewed. HENT: Normocephalic. Atraumatic. Eyes: EOMI. No discharge. Cardiovascular: No JVD. Respiratory: Normal effort. GI: Non -distended. Musc: Left BKA with edema and tenderness RLE with edema  Neurological: He is alert. Follows commands.   Motor: Bilateral upper extremities, right lower extremity: 5/5 proximal to distal Left lower extremity: 5/5 hip flexion Skin: BKA with dressing c/d/i Right heel  ulcer healing Psychiatric: Flat     Assessment & Plan:  Right-handed male history of diabetes mellitus tobacco abuse and peripheral vascular disease presents for transitional care management after receiving CIR for left BKA.    1.  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   Cont therapies  Cont follow up with Vascular- plan to remove remainder of staples next week  2. Pain Management/phantom limb pain:              Wean Neurontin to 400mg  qhs  3.  Diabetes mellitus with peripheral neuropathy.    Patient has not been checking  Encouraged monitoring after seeing PCP  Needs to establish with PCP  4. Essential hypertension.    Cont meds  Controlled at present  5.  Tobacco abuse.  Encouraged abstinence again  6. Gait abnormality  Cont therapies  Wheelchair for safety  7. Right heel ulcer with cellulitis - healed  Still with edema  Encouraged elevation  >25 minutes spent with patient and son, with >20 minutes in counseling regarding prognosis, prosthesis, wound care

## 2018-11-14 ENCOUNTER — Ambulatory Visit: Payer: Medicare Other

## 2018-11-14 ENCOUNTER — Telehealth (HOSPITAL_COMMUNITY): Payer: Self-pay | Admitting: Rehabilitation

## 2018-11-14 NOTE — Telephone Encounter (Signed)

## 2018-11-17 ENCOUNTER — Other Ambulatory Visit: Payer: Self-pay

## 2018-11-17 ENCOUNTER — Ambulatory Visit (INDEPENDENT_AMBULATORY_CARE_PROVIDER_SITE_OTHER): Payer: Medicare Other | Admitting: Family

## 2018-11-17 ENCOUNTER — Encounter: Payer: Self-pay | Admitting: Family

## 2018-11-17 VITALS — BP 149/75 | HR 99 | Temp 97.9°F | Resp 16 | Ht 69.0 in | Wt 185.0 lb

## 2018-11-17 DIAGNOSIS — I779 Disorder of arteries and arterioles, unspecified: Secondary | ICD-10-CM

## 2018-11-17 DIAGNOSIS — E1151 Type 2 diabetes mellitus with diabetic peripheral angiopathy without gangrene: Secondary | ICD-10-CM

## 2018-11-17 DIAGNOSIS — F17200 Nicotine dependence, unspecified, uncomplicated: Secondary | ICD-10-CM

## 2018-11-17 DIAGNOSIS — Z89512 Acquired absence of left leg below knee: Secondary | ICD-10-CM

## 2018-11-17 MED ORDER — CLOPIDOGREL BISULFATE 75 MG PO TABS: 75.0000 mg | ORAL_TABLET | Freq: Every day | ORAL | 0 refills | Status: DC

## 2018-11-17 MED ORDER — CEPHALEXIN 500 MG PO CAPS: 500.0000 mg | ORAL_CAPSULE | Freq: Two times a day (BID) | ORAL | 0 refills | Status: DC

## 2018-11-17 NOTE — Progress Notes (Signed)
[facility]   CC: Follow up left BKA evaluation and staples removal   History of Present Illness Thomas Garcia is a 78 y.o. 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 Carlis Abbott for critical limb ischemia with tissue loss.  Prior to this performed left lower extremity intervention and then a TMA.  Unfortunately this failed to heal and we had to convert him to a BKA.    Dr. Carlis Abbott last evaluated pt on 11-04-18. At that time pt had a little bit of cellulitis between the staples. Dr. Carlis Abbott prescribed a course of Augmentin for 1 week.  Every other staple removed. Pt was to follow-up next week in the PA clinic for one more wound check before the staples are completely removed.   Discussed if this wound breaks down he potentially would require conversion to a higher level amputation or above-knee amputation. 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  Discussed with him and his son that day if he has any progressive tissue loss in the right foot we should proceed with arteriogram and possible intervention on the right leg.  Both him and his son stated they will let us know if he has any new wounds or ankle wound that is healing starts breaking down.  Pt returns for left BKA wound evaluation.    Diabetic: Yes, 9.0 A1C on 09-08-18, uncontrolled Tobacco use: smoker  (1 ppd )  Pt meds include: Statin :Yes Betablocker: No ASA: Yes Other anticoagulants/antiplatelets: Plavix  Past Medical History:  Diagnosis Date  . Diabetes mellitus without complication ([place])   . Kidney stones   . PONV (postoperative nausea and vomiting)     Social History Social History   Tobacco Use  . Smoking status: Current Every Day Smoker    Packs/day: 1.00    Years: 45.00    Pack years: 45.00    Types: Cigarettes  . Smokeless tobacco: Never Used  Substance Use Topics  . Alcohol use: No  . Drug use: No    Family History  Family History  Problem Relation Age of Onset  . Diabetes Mother     Past Surgical History:  Procedure Laterality Date  . ABDOMINAL AORTOGRAM W/LOWER EXTREMITY N/A 09/17/2018   Procedure: ABDOMINAL AORTOGRAM W/LOWER EXTREMITY;  Surgeon: Marty Heck, MD;  Location: [facility];  Service: Cardiovascular;  Laterality: N/A;  . AMPUTATION Left 09/22/2018   Procedure: AMPUTATION BELOW KNEE LEFT;  Surgeon: Marty Heck, MD;  Location: [facility];  Service: Vascular;  Laterality: Left;  . AMPUTATION TOE Left 09/10/2018   Procedure: AMPUTATION OF THIRD TOE LEFT FOOT, DEBRIDEMENT OF ULCERS ON SECOND TOE AND PARTIAL AMPUTATION SECOND TOE ON LEFT FOOT;  Surgeon: Virl Cagey, MD;  [facility];  Service: General;  Laterality: Left;  . APPLICATION OF WOUND VAC Left 09/18/2018   Procedure: APPLICATION OF WOUND VAC;  Surgeon: Marty Heck, MD;  Location: [facility];  Service: Vascular;  Laterality: Left;  . CYSTOSCOPY  05/08/2012   Procedure: CYSTOSCOPY;  Surgeon: Marissa Nestle, MD;  [facility];  Service: Urology;  Laterality: N/A;  Evacuation of clots  . KIDNEY SURGERY     sutures   . PERIPHERAL VASCULAR BALLOON ANGIOPLASTY  09/17/2018   Procedure: PERIPHERAL VASCULAR BALLOON ANGIOPLASTY;  Surgeon: Marty Heck, MD;  Location: [facility];  Service: Cardiovascular;;  LT SFA and AT  .  SPLENECTOMY    . TRANSMETATARSAL AMPUTATION Left 09/18/2018   Procedure: TRANSMETATARSAL AMPUTATION LEFT FOOT;  Surgeon: Marty Heck, MD;  Location: [employer] [facility];  Service: Vascular;  Laterality: Left;  . TRANSURETHRAL RESECTION OF PROSTATE  05/14/2012   Procedure: TRANSURETHRAL RESECTION OF THE PROSTATE (TURP);  Surgeon: Marissa Nestle, MD;  [facility];  Service: Urology;  Laterality: N/A;    No Known Allergies  Current Outpatient Medications  Medication Sig Dispense Refill  . acetaminophen (TYLENOL) 325 MG tablet Take 2 tablets (650 mg total) by mouth every 4  (four) hours as needed for headache or mild pain.    Marland Kitchen atorvastatin (LIPITOR) 10 MG tablet Take 1 tablet (10 mg total) by mouth daily at 6 PM. 30 tablet 0  . gabapentin (NEURONTIN) 400 MG capsule Take 1 capsule (400 mg total) by mouth 3 (three) times daily. 90 capsule 1  . metFORMIN (GLUCOPHAGE) 850 MG tablet Take 1 tablet (850 mg total) by mouth 2 (two) times daily with a meal. 60 tablet 1  . pantoprazole (PROTONIX) 40 MG tablet Take 1 tablet (40 mg total) by mouth daily. 30 tablet 0  . aspirin 81 MG EC tablet Take 1 tablet (81 mg total) by mouth daily. (Patient not taking: Reported on 11/17/2018) 30 tablet   . clopidogrel (PLAVIX) 75 MG tablet Take 1 tablet (75 mg total) by mouth daily. 30 tablet 0  . oxyCODONE-acetaminophen (PERCOCET/ROXICET) 5-325 MG tablet Take 1-2 tablets by mouth every 4 (four) hours as needed for severe pain. (Patient not taking: Reported on 11/17/2018) 20 tablet 0   No current facility-administered medications for this visit.     ROS: See HPI for pertinent positives and negatives.   Physical Examination  Vitals:   11/17/18 1352  BP: (!) 149/75  Pulse: 99  Resp: 16  Temp: 97.9 F (36.6 C)  TempSrc: Temporal  SpO2: 99%  Weight: 185 lb (83.9 kg)  Height: 5\' 9"  (1.753 m)   Body mass index is 27.32 kg/m.  General: A&O x 3, frail appearing elderly male. Gait: seated in w/c HEENT: No gross abnormalities.  Pulmonary: Respirations are non labored Cardiac: regular rhythm     Radial pulses are palpable bilaterally   Adominal aortic pulse is not palpable                         VASCULAR EXAM: Extremities without ischemic changes, without Gangrene; without open wounds. Erythema at left BKA incision. See photos below.    Left BKA, medial aspect   Left BKA, lateral aspect    Left BKA, anterior aspect                                                                                                            LE Pulses Right Left       FEMORAL  faintly  palpable  2+ palpable        POPLITEAL  not palpable   not palpable       POSTERIOR TIBIAL  not  palpable   BKA        DORSALIS PEDIS      ANTERIOR TIBIAL not palpable  BKA    Abdomen: soft, NT, no palpable masses. Skin: no rashes, no cellulitis, no ulcers noted. Musculoskeletal: no muscle wasting or atrophy.  Neurologic: A&O X 3; appropriate affect, Sensation is normal; MOTOR FUNCTION:  moving all extremities equally, motor strength 5/5 throughout. Speech is fluent/normal. CN 2-12 intact. Psychiatric: Thought content is normal, mood appropriate for clinical situation.     ASSESSMENT: Thomas Garcia is a 78 y.o. 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 Carlis Abbott for critical limb ischemia with tissue loss.  Prior to this performed left lower extremity intervention and then a TMA.  Unfortunately this failed to heal and we had to convert him to a BKA.    [place] visits him already; please advise HH to daily apply triple antibiotic ointment to left BKA incision that has moderate erythema, no drainage, no fluctuance.  Pt denies fever or chills.    Keflex  500 mg po bid x 10 days, disp#20, 0 refills sent to his [facility]. Triple antibx ointment daily dressing change to left BKA,   Plavix reordered, 30 days with no refills, Dr. Carlis Abbott to determine whether to continue after this.   Referral to podiatrist in [place], to address his thick long toenails on his remaninig (right) foot, he has uncontrolled DM and PAD.   We discussed smoking cessation, he expressed a desire to quit.   Remaining staples removed.  Unfortunately he continues to smoke and his DM is uncontrolled. He does not have a PCP, but has an appointment with a new PCP on 11-24-18.     PLAN:  Based on the patient's HPI and examination, pt will return to clinic in 2-3 weeks with right LE arterial duplex and right ABI, see Dr. Carlis Abbott only.   I discussed in depth with the patient the nature of  atherosclerosis, and emphasized the importance of maximal medical management including strict control of blood pressure, blood glucose, and lipid levels, obtaining regular exercise, and cessation of smoking.  The patient is aware that without maximal medical management the underlying atherosclerotic disease process will progress, limiting the benefit of any interventions.  The patient was given information about PAD including signs, symptoms, treatment, what symptoms should prompt the patient to seek immediate medical care, and risk reduction measures to take.  Clemon Chambers, RN, MSN, FNP-C [facility] Phone: [phone]  Clinic MD: Trula Slade  11/17/18 2:01 PM

## 2018-11-17 NOTE — Patient Instructions (Signed)

## 2018-11-18 ENCOUNTER — Telehealth: Payer: Self-pay | Admitting: Family

## 2018-11-18 NOTE — Telephone Encounter (Signed)
I gave a verbal order to the Rep at [facility] to add triple antibiotic ointment to daily dressing changes to the Left BKA.   Rep said this order is not considered a "skill" but she will contact his nurse Colletta Maryland to see if she can teach the patient to apply the ointment himself.  Thurston Hole., LPN

## 2018-11-20 ENCOUNTER — Ambulatory Visit: Payer: Medicare Other | Admitting: Family Medicine

## 2018-11-24 ENCOUNTER — Encounter: Payer: Self-pay | Admitting: Family Medicine

## 2018-11-24 ENCOUNTER — Other Ambulatory Visit: Payer: Self-pay

## 2018-11-24 ENCOUNTER — Ambulatory Visit (INDEPENDENT_AMBULATORY_CARE_PROVIDER_SITE_OTHER): Payer: Medicare HMO | Admitting: Family Medicine

## 2018-11-24 VITALS — BP 138/79 | HR 99 | Temp 98.7°F | Ht 69.0 in | Wt 185.0 lb

## 2018-11-24 DIAGNOSIS — E1151 Type 2 diabetes mellitus with diabetic peripheral angiopathy without gangrene: Secondary | ICD-10-CM

## 2018-11-24 DIAGNOSIS — I10 Essential (primary) hypertension: Secondary | ICD-10-CM

## 2018-11-24 DIAGNOSIS — F17200 Nicotine dependence, unspecified, uncomplicated: Secondary | ICD-10-CM

## 2018-11-24 DIAGNOSIS — D72828 Other elevated white blood cell count: Secondary | ICD-10-CM | POA: Diagnosis not present

## 2018-11-24 DIAGNOSIS — I739 Peripheral vascular disease, unspecified: Secondary | ICD-10-CM

## 2018-11-24 LAB — POCT GLYCOSYLATED HEMOGLOBIN (HGB A1C): Hemoglobin A1C: 6.9 % — AB (ref 4.0–5.6)

## 2018-11-24 MED ORDER — LISINOPRIL 5 MG PO TABS: 5.0000 mg | ORAL_TABLET | Freq: Every day | ORAL | 1 refills | Status: DC

## 2018-11-24 MED ORDER — METFORMIN HCL 850 MG PO TABS: 850.0000 mg | ORAL_TABLET | Freq: Two times a day (BID) | ORAL | 1 refills | Status: DC

## 2018-11-24 MED ORDER — ATORVASTATIN CALCIUM 10 MG PO TABS: 10.0000 mg | ORAL_TABLET | Freq: Every day | ORAL | 1 refills | Status: DC

## 2018-11-24 NOTE — Patient Instructions (Addendum)

## 2018-11-24 NOTE — Progress Notes (Signed)
New Patient Office Visit  Subjective:  Patient ID: Thomas Garcia, male    DOB: [birth year]  Age: 78 y.o. MRN: [id number]  CC:  Chief Complaint  Patient presents with  . New Patient (Initial Visit)  . Diabetes    Type 2     HPI CESAR ROGERSON presents forDM-1998-oral medication Dr. Carlis Abbott  evaluating pt with  cellulitis  Noted from [place].  6/20-vascular-started with ulcerations and toe with removal then foot removal then lower leg removed-poor circulation . pt long term tob use and DM-poorly controlled -9.0% 8/31-Podiatry appt for foot exam/care-h/o ulcerations, thickened nails 12/09/18-vascular-h/o left BKA, now with right leg decrease in pulse and pain [employer]- prosthetic -unable to heal infection previously-now evaluation  Past Medical History:  Diagnosis Date  . Diabetes mellitus without complication ([place])   . Kidney stones   . PONV (postoperative nausea and vomiting)     Past Surgical History:  Procedure Laterality Date  . ABDOMINAL AORTOGRAM W/LOWER EXTREMITY N/A 09/17/2018   Procedure: ABDOMINAL AORTOGRAM W/LOWER EXTREMITY;  Surgeon: Marty Heck, MD;  Location: [facility];  Service: Cardiovascular;  Laterality: N/A;  . AMPUTATION Left 09/22/2018   Procedure: AMPUTATION BELOW KNEE LEFT;  Surgeon: Marty Heck, MD;  Location: [place];  Service: Vascular;  Laterality: Left;  . AMPUTATION TOE Left 09/10/2018   Procedure: AMPUTATION OF THIRD TOE LEFT FOOT, DEBRIDEMENT OF ULCERS ON SECOND TOE AND PARTIAL AMPUTATION SECOND TOE ON LEFT FOOT;  Surgeon: Virl Cagey, MD;  Location: [facility];  Service: General;  Laterality: Left;  . APPLICATION OF WOUND VAC Left 09/18/2018   Procedure: APPLICATION OF WOUND VAC;  Surgeon: Marty Heck, MD;  Location: [place];  Service: Vascular;  Laterality: Left;  . CYSTOSCOPY  05/08/2012   Procedure: CYSTOSCOPY;  Surgeon: Marissa Nestle, MD;  Location: [facility];  Service: Urology;  Laterality: N/A;  Evacuation of clots   . KIDNEY SURGERY     sutures   . PERIPHERAL VASCULAR BALLOON ANGIOPLASTY  09/17/2018   Procedure: PERIPHERAL VASCULAR BALLOON ANGIOPLASTY;  Surgeon: Marty Heck, MD;  Location: [facility];  Service: Cardiovascular;;  LT SFA and AT  . SPLENECTOMY    . TRANSMETATARSAL AMPUTATION Left 09/18/2018   Procedure: TRANSMETATARSAL AMPUTATION LEFT FOOT;  Surgeon: Marty Heck, MD;  Location: [place];  Service: Vascular;  Laterality: Left;  . TRANSURETHRAL RESECTION OF PROSTATE  05/14/2012   Procedure: TRANSURETHRAL RESECTION OF THE PROSTATE (TURP);  Surgeon: Marissa Nestle, MD;  [facility];  Service: Urology;  Laterality: N/A;    Family History  Problem Relation Age of Onset  . Diabetes Mother     Social History  56 years total 78yo-78yo 1pk/day+30 pk/years + last 5 years 3/4 pk/day=4pk years  Socioeconomic History  . Marital status: Legally Separated    Spouse name: Not on file  . Number of children: Not on file  . Years of education: Not on file  . Highest education level: Not on file  Occupational History  . Not on file  Social Needs  . Financial resource strain: Not on file  . Food insecurity    Worry: Not on file    Inability: Not on file  . Transportation needs    Medical: Not on file    Non-medical: Not on file  Tobacco Use  . Smoking status: Current Every Day Smoker    Packs/day: 1.00    Years: 45.00    Pack years: 45.00  Types: Cigarettes  . Smokeless tobacco: Never Used  Substance and Sexual Activity  . Alcohol use: No  . Drug use: No  . Sexual activity: Not on file  Lifestyle  . Physical activity    Days per week: Not on file    Minutes per session: Not on file  . Stress: Not on file  Relationships  . Social Herbalist on phone: Not on file    Gets together: Not on file    Attends religious service: Not on file    Active member of club or organization: Not on file    Attends meetings of clubs or organizations: Not on  file    Relationship status: Not on file  . Intimate partner violence    Fear of current or ex partner: Not on file    Emotionally abused: Not on file    Physically abused: Not on file    Forced sexual activity: Not on file  Other Topics Concern  . Not on file  Social History Narrative  . Not on file    Ref Range & Units 85mo ago (09/08/18) 3yr ago (10/26/08) 91yr ago (10/26/08)  Hgb A1c MFr Bld 4.8 - 5.6 % 9.0High   10.8High  R, CM  10.4 R   Comment: (NOTE)  Pre diabetes:     5.7%-6.4%      ROS Review of Systems  Constitutional: Positive for fatigue. Negative for fever.  HENT: Negative for congestion.   Respiratory: Negative for cough, chest tightness and shortness of breath.   Cardiovascular: Positive for chest pain and leg swelling.  Gastrointestinal: Negative for abdominal pain.  Endocrine: Negative for polydipsia.  Genitourinary: Positive for hematuria.  Musculoskeletal: Positive for gait problem and joint swelling.  Skin: Positive for color change and wound.  Psychiatric/Behavioral: The patient is not nervous/anxious.   BKA with difficulty healing  Objective:   Today's Vitals: BP 138/79 (BP Location: Left Arm, Patient Position: Sitting, Cuff Size: Normal)   Pulse 99   Temp 98.7 F (37.1 C) (Oral)   Ht 5\' 9"  (1.753 m)   Wt 185 lb (83.9 kg)   SpO2 98%   BMI 27.32 kg/m   Physical Exam Constitutional:      General: He is not in acute distress. HENT:     Head: Normocephalic and atraumatic.  Eyes:     Conjunctiva/sclera: Conjunctivae normal.  Neck:     Musculoskeletal: Normal range of motion and neck supple.  Cardiovascular:     Rate and Rhythm: Normal rate and regular rhythm.     Pulses: Normal pulses.     Heart sounds: Normal heart sounds.  Pulmonary:     Effort: Pulmonary effort is normal.     Breath sounds: Normal breath sounds.  Musculoskeletal:        General: Deformity present.  Skin:    Findings: Erythema, lesion and rash present.   Neurological:     Mental Status: He is alert and oriented to person, place, and time.  Psychiatric:        Mood and Affect: Mood normal.        Behavior: Behavior normal.     Assessment & Plan:    Outpatient Encounter Medications as of 11/24/2018  Medication Sig  . acetaminophen (TYLENOL) 325 MG tablet Take 2 tablets (650 mg total) by mouth every 4 (four) hours as needed for headache or mild pain.  Marland Kitchen aspirin 81 MG EC tablet Take 1 tablet (81 mg total) by mouth  daily. (Patient not taking: Reported on 11/17/2018)  . atorvastatin (LIPITOR) 10 MG tablet Take 1 tablet (10 mg total) by mouth daily at 6 PM.  . clopidogrel (PLAVIX) 75 MG tablet Take 1 tablet (75 mg total) by mouth daily.  Marland Kitchen gabapentin (NEURONTIN) 400 MG capsule Take 1 capsule (400 mg total) by mouth 3 (three) times daily.  . metFORMIN (GLUCOPHAGE) 850 MG tablet Take 1 tablet (850 mg total) by mouth 2 (two) times daily with a meal.  . [DISCONTINUED] cephALEXin (KEFLEX) 500 MG capsule Take 1 capsule (500 mg total) by mouth 2 (two) times daily. (Patient not taking: Reported on 11/24/2018)  . [DISCONTINUED] oxyCODONE-acetaminophen (PERCOCET/ROXICET) 5-325 MG tablet Take 1-2 tablets by mouth every 4 (four) hours as needed for severe pain. (Patient not taking: Reported on 11/17/2018)  . [DISCONTINUED] pantoprazole (PROTONIX) 40 MG tablet Take 1 tablet (40 mg total) by mouth daily. (Patient not taking: Reported on 11/24/2018)   No facility-administered encounter medications on file as of 11/24/2018.   1. 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 PVD (peripheral vascular disease) (HCC) Sees surgeon-right LE -previously BKA-left plavix and neurontin 3. Essential hypertension - TSH - Lipid panel - Uric  acid lipitor -rx 4. Other elevated white blood cell (WBC) count - CBC - COMPLETE METABOLIC PANEL WITH GFR Concern for elevated WBC-prior infection-no antibiotics 5. NICOTINE ADDICTION D/w pt quitting  Follow-up:3 months-DM   Hannah Beat, MD

## 2018-11-25 ENCOUNTER — Ambulatory Visit: Payer: Medicare HMO | Admitting: *Deleted

## 2018-11-25 DIAGNOSIS — Z89512 Acquired absence of left leg below knee: Secondary | ICD-10-CM

## 2018-11-25 NOTE — Progress Notes (Signed)
Ronalee Belts from [facility] called to let us know that this patient had 1 retained staple that had migrated out to skin surface. The patient came in to the office and I removed the staple without difficulty. I re-educated the patient on wound cleansing and care since there is still a small area medially that is healing in, No S&S of infection noted.

## 2018-11-27 DIAGNOSIS — E1151 Type 2 diabetes mellitus with diabetic peripheral angiopathy without gangrene: Secondary | ICD-10-CM | POA: Diagnosis not present

## 2018-11-27 DIAGNOSIS — I10 Essential (primary) hypertension: Secondary | ICD-10-CM | POA: Diagnosis not present

## 2018-11-27 DIAGNOSIS — D72828 Other elevated white blood cell count: Secondary | ICD-10-CM | POA: Diagnosis not present

## 2018-11-28 LAB — COMPLETE METABOLIC PANEL WITH GFR
AG Ratio: 1.2 (calc) (ref 1.0–2.5)
ALT: 6 U/L — ABNORMAL LOW (ref 9–46)
AST: 8 U/L — ABNORMAL LOW (ref 10–35)
Albumin: 3.6 g/dL (ref 3.6–5.1)
Alkaline phosphatase (APISO): 79 U/L (ref 35–144)
BUN/Creatinine Ratio: 25 (calc) — ABNORMAL HIGH (ref 6–22)
BUN: 17 mg/dL (ref 7–25)
CO2: 26 mmol/L (ref 20–32)
Calcium: 9.6 mg/dL (ref 8.6–10.3)
Chloride: 107 mmol/L (ref 98–110)
Creat: 0.67 mg/dL — ABNORMAL LOW (ref 0.70–1.18)
GFR, Est African American: 108 mL/min/{1.73_m2} (ref >=60)
GFR, Est Non African American: 93 mL/min/{1.73_m2} (ref >=60)
Globulin: 3 g/dL (calc) (ref 1.9–3.7)
Glucose, Bld: 133 mg/dL — ABNORMAL HIGH (ref 65–99)
Potassium: 5 mmol/L (ref 3.5–5.3)
Sodium: 141 mmol/L (ref 135–146)
Total Bilirubin: 0.2 mg/dL (ref 0.2–1.2)
Total Protein: 6.6 g/dL (ref 6.1–8.1)

## 2018-11-28 LAB — TSH: TSH: 1.25 mIU/L (ref 0.40–4.50)

## 2018-11-28 LAB — URIC ACID: Uric Acid, Serum: 5.2 mg/dL (ref 4.0–8.0)

## 2018-11-28 LAB — CBC
HCT: 31 % — ABNORMAL LOW (ref 38.5–50.0)
Hemoglobin: 9.6 g/dL — ABNORMAL LOW (ref 13.2–17.1)
MCH: 26.1 pg — ABNORMAL LOW (ref 27.0–33.0)
MCHC: 31 g/dL — ABNORMAL LOW (ref 32.0–36.0)
MCV: 84.2 fL (ref 80.0–100.0)
MPV: 11 fL (ref 7.5–12.5)
Platelets: 498 10*3/uL — ABNORMAL HIGH (ref 140–400)
RBC: 3.68 10*6/uL — ABNORMAL LOW (ref 4.20–5.80)
RDW: 14.5 % (ref 11.0–15.0)
WBC: 12.3 10*3/uL — ABNORMAL HIGH (ref 3.8–10.8)

## 2018-11-28 LAB — LIPID PANEL
Cholesterol: 152 mg/dL (ref <200)
HDL: 44 mg/dL (ref >=40)
LDL Cholesterol (Calc): 83 mg/dL (calc)
Non-HDL Cholesterol (Calc): 108 mg/dL (calc) (ref <130)
Total CHOL/HDL Ratio: 3.5 (calc) (ref <5.0)
Triglycerides: 148 mg/dL (ref <150)

## 2018-11-28 LAB — MICROALBUMIN, URINE: Microalb, Ur: 19.9 mg/dL

## 2018-12-05 ENCOUNTER — Other Ambulatory Visit: Payer: Self-pay

## 2018-12-05 DIAGNOSIS — I779 Disorder of arteries and arterioles, unspecified: Secondary | ICD-10-CM

## 2018-12-09 ENCOUNTER — Ambulatory Visit (HOSPITAL_COMMUNITY)
Admission: RE | Admit: 2018-12-09 | Discharge: 2018-12-09 | Disposition: A | Discharge status: Home / Self Care | Payer: Medicare HMO | Source: Ambulatory Visit | Attending: Vascular Surgery | Admitting: Vascular Surgery

## 2018-12-09 ENCOUNTER — Encounter: Payer: Self-pay | Admitting: Vascular Surgery

## 2018-12-09 ENCOUNTER — Other Ambulatory Visit: Payer: Self-pay

## 2018-12-09 ENCOUNTER — Ambulatory Visit (INDEPENDENT_AMBULATORY_CARE_PROVIDER_SITE_OTHER): Payer: Self-pay | Admitting: Vascular Surgery

## 2018-12-09 VITALS — BP 125/59 | HR 89 | Temp 98.0°F | Resp 16 | Ht 69.0 in | Wt 185.0 lb

## 2018-12-09 DIAGNOSIS — I739 Peripheral vascular disease, unspecified: Secondary | ICD-10-CM

## 2018-12-09 DIAGNOSIS — I779 Disorder of arteries and arterioles, unspecified: Secondary | ICD-10-CM | POA: Diagnosis not present

## 2018-12-09 NOTE — Progress Notes (Signed)
Patient name: Thomas Garcia MRN: [id number] DOB: 11/04/1940 Sex: male  REASON FOR VISIT: Follow-up from NP clinic  HPI: Thomas Garcia is a 78 y.o. 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  Ultimately performed left lower extremity intervention and then a TMA.  Unfortunately this failed to heal and we had to convert him to a BKA.  His BKA has healed.  Apparently there was concerns about his right lower extremity.  On follow-up today patient reports he has no wounds on his right foot.  He is having no pain.  He sitting in a wheelchair today.  He would like to avoid [facility] if he can.  Past Medical History:  Diagnosis Date  . Diabetes mellitus without complication ([place])   . Kidney stones   . PONV (postoperative nausea and vomiting)     Past Surgical History:  Procedure Laterality Date  . ABDOMINAL AORTOGRAM W/LOWER EXTREMITY N/A 09/17/2018   Procedure: ABDOMINAL AORTOGRAM W/LOWER EXTREMITY;  Surgeon: Marty Heck, MD;  Location: [place] [facility] [facility];  Service: Cardiovascular;  Laterality: N/A;  . AMPUTATION Left 09/22/2018   Procedure: AMPUTATION BELOW KNEE LEFT;  Surgeon: Marty Heck, MD;  Location: [place];  Service: Vascular;  Laterality: Left;  . AMPUTATION TOE Left 09/10/2018   Procedure: AMPUTATION OF THIRD TOE LEFT FOOT, DEBRIDEMENT OF ULCERS ON SECOND TOE AND PARTIAL AMPUTATION SECOND TOE ON LEFT FOOT;  Surgeon: Virl Cagey, MD;  Location: [facility];  Service: General;  Laterality: Left;  . APPLICATION OF WOUND VAC Left 09/18/2018   Procedure: APPLICATION OF WOUND VAC;  Surgeon: Marty Heck, MD;  Location: [place];  Service: Vascular;  Laterality: Left;  . CYSTOSCOPY  05/08/2012   Procedure: CYSTOSCOPY;  Surgeon: Marissa Nestle, MD;  [facility];  Service: Urology;  Laterality: N/A;  Evacuation of clots  . KIDNEY SURGERY     sutures   . PERIPHERAL  VASCULAR BALLOON ANGIOPLASTY  09/17/2018   Procedure: PERIPHERAL VASCULAR BALLOON ANGIOPLASTY;  Surgeon: Marty Heck, MD;  Location: [facility] [facility];  Service: Cardiovascular;;  LT SFA and AT  . SPLENECTOMY    . TRANSMETATARSAL AMPUTATION Left 09/18/2018   Procedure: TRANSMETATARSAL AMPUTATION LEFT FOOT;  Surgeon: Marty Heck, MD;  Location: [place];  Service: Vascular;  Laterality: Left;  . TRANSURETHRAL RESECTION OF PROSTATE  05/14/2012   Procedure: TRANSURETHRAL RESECTION OF THE PROSTATE (TURP);  Surgeon: Marissa Nestle, MD;  [facility];  Service: Urology;  Laterality: N/A;    Family History  Problem Relation Age of Onset  . Diabetes Mother     SOCIAL HISTORY: Social History   Tobacco Use  . Smoking status: Current Every Day Smoker    Packs/day: 1.00    Years: 45.00    Pack years: 45.00    Types: Cigarettes  . Smokeless tobacco: Never Used  Substance Use Topics  . Alcohol use: No    No Known Allergies  Current Outpatient Medications  Medication Sig Dispense Refill  . acetaminophen (TYLENOL) 325 MG tablet Take 2 tablets (650 mg total) by mouth every 4 (four) hours as needed for headache or mild pain.    Marland Kitchen aspirin 81 MG EC tablet Take 1 tablet (81 mg total) by mouth daily. 30 tablet   . atorvastatin (LIPITOR) 10 MG tablet Take 1 tablet (10 mg total) by mouth daily at 6 PM. 90 tablet  1  . clopidogrel (PLAVIX) 75 MG tablet Take 1 tablet (75 mg total) by mouth daily. 30 tablet 0  . gabapentin (NEURONTIN) 400 MG capsule Take 1 capsule (400 mg total) by mouth 3 (three) times daily. 90 capsule 1  . lisinopril (ZESTRIL) 5 MG tablet Take 1 tablet (5 mg total) by mouth daily. 90 tablet 1  . metFORMIN (GLUCOPHAGE) 850 MG tablet Take 1 tablet (850 mg total) by mouth 2 (two) times daily with a meal. 180 tablet 1   No current facility-administered medications for this visit.     REVIEW OF SYSTEMS:  [X]  denotes positive finding, [ ]  denotes negative finding  Cardiac  Comments:  Chest pain or chest pressure:    Shortness of breath upon exertion:    Short of breath when lying flat:    Irregular heart rhythm:        Vascular    Pain in calf, thigh, or hip brought on by ambulation:    Pain in feet at night that wakes you up from your sleep:     Blood clot in your veins:    Leg swelling:         Pulmonary    Oxygen at home:    Productive cough:     Wheezing:         Neurologic    Sudden weakness in arms or legs:     Sudden numbness in arms or legs:     Sudden onset of difficulty speaking or slurred speech:    Temporary loss of vision in one eye:     Problems with dizziness:         Gastrointestinal    Blood in stool:     Vomited blood:         Genitourinary    Burning when urinating:     Blood in urine:        Psychiatric    Major depression:         Hematologic    Bleeding problems:    Problems with blood clotting too easily:        Skin    Rashes or ulcers:        Constitutional    Fever or chills:      PHYSICAL EXAM: Vitals:   12/09/18 1506  BP: (!) 125/59  Pulse: 89  Resp: 16  Temp: 98 F (36.7 C)  TempSrc: Temporal  SpO2: 100%  Weight: 185 lb (83.9 kg)  Height: 5\' 9"  (1.753 m)    GENERAL: The patient is a well-nourished male, in no acute distress. The vital signs are documented above. CARDIAC: There is a regular rate and rhythm.  VASCULAR:  Left BKA - c/d/i Right DP/PT signals monophasic DATA:   None  Assessment/Plan:  78 year old male presents after left BKA on 09/22/2018 for a critical ischemia with tissue loss and nonhealing TMA.  Here today for evaluation of right leg given concern for NP.  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  He has no tissue loss today or rest pain that would warrant intervention.  Discussed that he needs to let our office know if something changes in the future.  I will have him follow-up in 3 months.   Marty Heck, MD [facility] Office: [phone] Pager: [place]

## 2018-12-16 ENCOUNTER — Other Ambulatory Visit: Payer: Self-pay

## 2018-12-16 ENCOUNTER — Other Ambulatory Visit: Payer: Self-pay | Admitting: Vascular Surgery

## 2018-12-16 MED ORDER — CLOPIDOGREL BISULFATE 75 MG PO TABS: 75.0000 mg | ORAL_TABLET | Freq: Every day | ORAL | 0 refills | Status: DC

## 2018-12-16 MED ORDER — CLOPIDOGREL BISULFATE 75 MG PO TABS: 75.0000 mg | ORAL_TABLET | Freq: Every day | ORAL | 4 refills | Status: DC

## 2018-12-22 ENCOUNTER — Ambulatory Visit: Payer: Medicare Other | Admitting: Physical Medicine & Rehabilitation

## 2018-12-24 ENCOUNTER — Encounter: Payer: Medicare HMO | Attending: Physical Medicine & Rehabilitation | Admitting: Physical Medicine & Rehabilitation

## 2018-12-24 ENCOUNTER — Other Ambulatory Visit: Payer: Self-pay

## 2018-12-24 ENCOUNTER — Encounter: Payer: Self-pay | Admitting: Physical Medicine & Rehabilitation

## 2018-12-24 VITALS — BP 150/70 | HR 94 | Temp 97.5°F | Wt 185.0 lb

## 2018-12-24 DIAGNOSIS — G8918 Other acute postprocedural pain: Secondary | ICD-10-CM | POA: Diagnosis not present

## 2018-12-24 DIAGNOSIS — I10 Essential (primary) hypertension: Secondary | ICD-10-CM | POA: Diagnosis not present

## 2018-12-24 DIAGNOSIS — Z720 Tobacco use: Secondary | ICD-10-CM | POA: Insufficient documentation

## 2018-12-24 DIAGNOSIS — S88112S Complete traumatic amputation at level between knee and ankle, left lower leg, sequela: Secondary | ICD-10-CM | POA: Diagnosis not present

## 2018-12-24 DIAGNOSIS — E119 Type 2 diabetes mellitus without complications: Secondary | ICD-10-CM | POA: Insufficient documentation

## 2018-12-24 DIAGNOSIS — M792 Neuralgia and neuritis, unspecified: Secondary | ICD-10-CM | POA: Diagnosis not present

## 2018-12-24 DIAGNOSIS — R269 Unspecified abnormalities of gait and mobility: Secondary | ICD-10-CM | POA: Insufficient documentation

## 2018-12-24 DIAGNOSIS — G546 Phantom limb syndrome with pain: Secondary | ICD-10-CM | POA: Diagnosis not present

## 2018-12-24 DIAGNOSIS — E1142 Type 2 diabetes mellitus with diabetic polyneuropathy: Secondary | ICD-10-CM | POA: Diagnosis not present

## 2018-12-24 NOTE — Progress Notes (Signed)
Subjective:    Patient ID: Thomas Garcia, male    DOB: 1940-07-21, 78 y.o.   MRN: [id number]  HPI Right-handed male history of diabetes mellitus tobacco abuse and peripheral vascular disease presents for follow up for left BKA.    Last clinic visit on 11/06/2018.  Since last visit, he is doing HEP.  He is following up with Vascular.  Stump is healing, but has an ulcer on left anterior tibia from scratching.  Denies falls. He is not aware of what medications he is taking.  CBGs have been slightly elevated, he is following up with PCP. BP is slightly elevated, but states normal for the most part. He smoking 1/2 PPD.  Right heel ulcer healed.  Pain Inventory Average Pain 0 Pain Right Now 0 My pain is na  In the last 24 hours, has pain interfered with the following? General activity 1 Relation with others 1 Enjoyment of life 1 What TIME of day is your pain at its worst? night Sleep (in general) Good  Pain is worse with: unsure Pain improves with: medication Relief from Meds: 8  Mobility use a walker ability to climb steps?  no do you drive?  no  Function retired I need assistance with the following:  meal prep, household duties and shopping  Neuro/Psych weakness numbness tremor tingling trouble walking confusion depression  Prior Studies Any changes since last visit?  no  Physicians involved in your care Any changes since last visit?  no   Family History  Problem Relation Age of Onset  . Diabetes Mother    Social History   Socioeconomic History  . Marital status: Legally Separated    Spouse name: Not on file  . Number of children: Not on file  . Years of education: Not on file  . Highest education level: Not on file  Occupational History  . Not on file  Social Needs  . Financial resource strain: Not on file  . Food insecurity    Worry: Not on file    Inability: Not on file  . Transportation needs    Medical: Not on file    Non-medical: Not on  file  Tobacco Use  . Smoking status: Current Every Day Smoker    Packs/day: 1.00    Years: 45.00    Pack years: 45.00    Types: Cigarettes  . Smokeless tobacco: Never Used  Substance and Sexual Activity  . Alcohol use: No  . Drug use: No  . Sexual activity: Not on file  Lifestyle  . Physical activity    Days per week: Not on file    Minutes per session: Not on file  . Stress: Not on file  Relationships  . Social Herbalist on phone: Not on file    Gets together: Not on file    Attends religious service: Not on file    Active member of club or organization: Not on file    Attends meetings of clubs or organizations: Not on file    Relationship status: Not on file  Other Topics Concern  . Not on file  Social History Narrative  . Not on file   Past Surgical History:  Procedure Laterality Date  . ABDOMINAL AORTOGRAM W/LOWER EXTREMITY N/A 09/17/2018   Procedure: ABDOMINAL AORTOGRAM W/LOWER EXTREMITY;  Surgeon: Marty Heck, MD;  Location: [place] [facility];  Service: Cardiovascular;  Laterality: N/A;  . AMPUTATION Left 09/22/2018   Procedure: AMPUTATION BELOW KNEE LEFT;  Surgeon: Marty Heck, MD;  Location: [place];  Service: Vascular;  Laterality: Left;  . AMPUTATION TOE Left 09/10/2018   Procedure: AMPUTATION OF THIRD TOE LEFT FOOT, DEBRIDEMENT OF ULCERS ON SECOND TOE AND PARTIAL AMPUTATION SECOND TOE ON LEFT FOOT;  Surgeon: Virl Cagey, MD;  [facility];  Service: General;  Laterality: Left;  . APPLICATION OF WOUND VAC Left 09/18/2018   Procedure: APPLICATION OF WOUND VAC;  Surgeon: Marty Heck, MD;  Location: [place];  Service: Vascular;  Laterality: Left;  . CYSTOSCOPY  05/08/2012   Procedure: CYSTOSCOPY;  Surgeon: Marissa Nestle, MD;  [facility];  Service: Urology;  Laterality: N/A;  Evacuation of clots  . KIDNEY SURGERY     sutures   . PERIPHERAL VASCULAR BALLOON ANGIOPLASTY  09/17/2018   Procedure: PERIPHERAL VASCULAR  BALLOON ANGIOPLASTY;  Surgeon: Marty Heck, MD;  Location: [place] [facility];  Service: Cardiovascular;;  LT SFA and AT  . SPLENECTOMY    . TRANSMETATARSAL AMPUTATION Left 09/18/2018   Procedure: TRANSMETATARSAL AMPUTATION LEFT FOOT;  Surgeon: Marty Heck, MD;  Location: [place];  Service: Vascular;  Laterality: Left;  . TRANSURETHRAL RESECTION OF PROSTATE  05/14/2012   Procedure: TRANSURETHRAL RESECTION OF THE PROSTATE (TURP);  Surgeon: Marissa Nestle, MD;  [facility];  Service: Urology;  Laterality: N/A;   Past Medical History:  Diagnosis Date  . Diabetes mellitus without complication ([place])   . Kidney stones   . PONV (postoperative nausea and vomiting)    BP (!) 150/70   Pulse 94   Temp (!) 97.5 F (36.4 C)   Wt 185 lb (83.9 kg) Comment: pt reported in wheelchair  SpO2 98%   BMI 27.32 kg/m   Opioid Risk Score:   Fall Risk Score:  `1  Depression screen PHQ 2/9  Depression screen PHQ 2/9 11/24/2018  Decreased Interest 0  Down, Depressed, Hopeless 0  PHQ - 2 Score 0     Review of Systems  HENT: Negative.   Eyes: Negative.   Respiratory: Negative.   Cardiovascular: Negative.   Gastrointestinal: Negative.   Endocrine: Negative.   Genitourinary: Negative.   Musculoskeletal: Positive for gait problem and joint swelling.  Skin: Negative.   Allergic/Immunologic: Negative.   Neurological: Positive for tremors, weakness and numbness.  Hematological: Negative.   Psychiatric/Behavioral: Positive for confusion.      Objective:   Physical Exam Constitutional: NAD. Vital signs reviewed. HENT: Normocephalic. Atraumatic. Eyes: EOMI. NO discharge. Cardiovascular: No JVD. Respiratory: Normal effort. No stridor.  GI: Non-distended. Musc: Left BKA with edema and tenderness, improving RLE with edema, improving Neurological: Alert Follows commands.   Motor: Bilateral upper extremities, right lower extremity: 5/5 proximal to distal Left lower extremity:  5/5 hip flexion Skin: BKA with nearly healed incision BKA with ulcer on anterior tibia Right heel ulcer healed Psychiatric: Normal mood. Normal affect.     Assessment & Plan:  Right-handed male history of diabetes mellitus tobacco abuse and peripheral vascular disease presents for follow up for left BKA.    1.  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   Cont HEP, plan for therapies after prosthesis  Cont follow up with Vascular  Educated on avoid scratching/abrasions to stump  2. Pain Management/phantom limb pain:              D/c Neurontin   3.  Diabetes mellitus with peripheral neuropathy.    Slightly elevated  Cont follow up with PCP  4. Essential hypertension.    Cont meds  Slightly elevated today, however, controlled for the most part, per patient  5.  Tobacco abuse.    Now smoking 1/2 PPD, decreased, but cont to encourage abstinence   6. Gait abnormality  Cont HEP  Wheelchair for safety  7. Right heel ulcer with cellulitis - healed

## 2018-12-31 DIAGNOSIS — E1142 Type 2 diabetes mellitus with diabetic polyneuropathy: Secondary | ICD-10-CM | POA: Diagnosis not present

## 2018-12-31 DIAGNOSIS — Z89512 Acquired absence of left leg below knee: Secondary | ICD-10-CM

## 2018-12-31 DIAGNOSIS — Z7984 Long term (current) use of oral hypoglycemic drugs: Secondary | ICD-10-CM

## 2018-12-31 DIAGNOSIS — G546 Phantom limb syndrome with pain: Secondary | ICD-10-CM | POA: Diagnosis not present

## 2018-12-31 DIAGNOSIS — F17210 Nicotine dependence, cigarettes, uncomplicated: Secondary | ICD-10-CM | POA: Diagnosis not present

## 2018-12-31 DIAGNOSIS — Z4800 Encounter for change or removal of nonsurgical wound dressing: Secondary | ICD-10-CM | POA: Diagnosis not present

## 2018-12-31 DIAGNOSIS — E785 Hyperlipidemia, unspecified: Secondary | ICD-10-CM | POA: Diagnosis not present

## 2018-12-31 DIAGNOSIS — I10 Essential (primary) hypertension: Secondary | ICD-10-CM | POA: Diagnosis not present

## 2018-12-31 DIAGNOSIS — D509 Iron deficiency anemia, unspecified: Secondary | ICD-10-CM | POA: Diagnosis not present

## 2018-12-31 DIAGNOSIS — Z4781 Encounter for orthopedic aftercare following surgical amputation: Secondary | ICD-10-CM | POA: Diagnosis not present

## 2018-12-31 DIAGNOSIS — E1151 Type 2 diabetes mellitus with diabetic peripheral angiopathy without gangrene: Secondary | ICD-10-CM | POA: Diagnosis not present

## 2019-01-01 DIAGNOSIS — G546 Phantom limb syndrome with pain: Secondary | ICD-10-CM | POA: Diagnosis not present

## 2019-01-08 ENCOUNTER — Other Ambulatory Visit: Payer: Self-pay | Admitting: Physical Medicine & Rehabilitation

## 2019-01-26 DIAGNOSIS — Z89512 Acquired absence of left leg below knee: Secondary | ICD-10-CM | POA: Diagnosis not present

## 2019-01-31 DIAGNOSIS — G546 Phantom limb syndrome with pain: Secondary | ICD-10-CM | POA: Diagnosis not present

## 2019-02-03 ENCOUNTER — Encounter: Payer: Medicare HMO | Admitting: Physical Medicine & Rehabilitation

## 2019-02-24 ENCOUNTER — Ambulatory Visit (INDEPENDENT_AMBULATORY_CARE_PROVIDER_SITE_OTHER): Payer: Medicare HMO | Admitting: Family Medicine

## 2019-02-24 ENCOUNTER — Encounter: Payer: Self-pay | Admitting: Family Medicine

## 2019-02-24 ENCOUNTER — Other Ambulatory Visit: Payer: Self-pay

## 2019-02-24 VITALS — BP 130/64 | HR 96 | Temp 98.6°F | Ht 69.0 in | Wt 185.0 lb

## 2019-02-24 DIAGNOSIS — Z23 Encounter for immunization: Secondary | ICD-10-CM | POA: Diagnosis not present

## 2019-02-24 DIAGNOSIS — D72829 Elevated white blood cell count, unspecified: Secondary | ICD-10-CM | POA: Diagnosis not present

## 2019-02-24 DIAGNOSIS — I10 Essential (primary) hypertension: Secondary | ICD-10-CM

## 2019-02-24 DIAGNOSIS — E1151 Type 2 diabetes mellitus with diabetic peripheral angiopathy without gangrene: Secondary | ICD-10-CM

## 2019-02-24 LAB — POCT GLYCOSYLATED HEMOGLOBIN (HGB A1C): Hemoglobin A1C: 5.8 % — AB (ref 4.0–5.6)

## 2019-02-24 MED ORDER — ATORVASTATIN CALCIUM 10 MG PO TABS: 10.0000 mg | ORAL_TABLET | Freq: Every day | ORAL | 1 refills | Status: DC

## 2019-02-24 MED ORDER — LISINOPRIL 5 MG PO TABS: 5.0000 mg | ORAL_TABLET | Freq: Every day | ORAL | 1 refills | Status: DC

## 2019-02-24 MED ORDER — CLOPIDOGREL BISULFATE 75 MG PO TABS: 75.0000 mg | ORAL_TABLET | Freq: Every day | ORAL | 1 refills | Status: DC

## 2019-02-24 MED ORDER — METFORMIN HCL 850 MG PO TABS: 850.0000 mg | ORAL_TABLET | Freq: Two times a day (BID) | ORAL | 1 refills | Status: DC

## 2019-02-24 NOTE — Progress Notes (Signed)
Established Patient Office Visit  Subjective:  Patient ID: Thomas Garcia, male    DOB: [birth year]  Age: 78 y.o. MRN: [id number]  CC:  Chief Complaint  Patient presents with  . Diabetes    HPI Thomas Garcia presents for DM-pt has not taken metformin  in one month. Pt is out of medication Glucose 114-194 on glucose monitoring. BKA-left with improved wound healing but swelling causing concerns with artificial limb fitting. Pt states his therapy has helped him with transfers.  Pt taking lisinopril for bp control daily. No headaches or visual changes. Past Medical History:  Diagnosis Date  . Diabetes mellitus without complication ([place])   . Kidney stones   . PONV (postoperative nausea and vomiting)     Past Surgical History:  Procedure Laterality Date  . ABDOMINAL AORTOGRAM W/LOWER EXTREMITY N/A 09/17/2018   Procedure: ABDOMINAL AORTOGRAM W/LOWER EXTREMITY;  Surgeon: Marty Heck, MD;  Location: [facility];  Service: Cardiovascular;  Laterality: N/A;  . AMPUTATION Left 09/22/2018   Procedure: AMPUTATION BELOW KNEE LEFT;  Surgeon: Marty Heck, MD;  Location: [facility];  Service: Vascular;  Laterality: Left;  . AMPUTATION TOE Left 09/10/2018   Procedure: AMPUTATION OF THIRD TOE LEFT FOOT, DEBRIDEMENT OF ULCERS ON SECOND TOE AND PARTIAL AMPUTATION SECOND TOE ON LEFT FOOT;  Surgeon: Virl Cagey, MD;  Location: [facility];  Service: General;  Laterality: Left;  . APPLICATION OF WOUND VAC Left 09/18/2018   Procedure: APPLICATION OF WOUND VAC;  Surgeon: Marty Heck, MD;  Location: [facility];  Service: Vascular;  Laterality: Left;  . CYSTOSCOPY  05/08/2012   Procedure: CYSTOSCOPY;  Surgeon: Marissa Nestle, MD;  Location: [facility];  Service: Urology;  Laterality: N/A;  Evacuation of clots  . KIDNEY SURGERY     sutures   . PERIPHERAL VASCULAR BALLOON ANGIOPLASTY  09/17/2018   Procedure: PERIPHERAL VASCULAR BALLOON ANGIOPLASTY;  Surgeon: Marty Heck, MD;   Location: [facility];  Service: Cardiovascular;;  LT SFA and AT  . SPLENECTOMY    . TRANSMETATARSAL AMPUTATION Left 09/18/2018   Procedure: TRANSMETATARSAL AMPUTATION LEFT FOOT;  Surgeon: Marty Heck, MD;  Location: [facility];  Service: Vascular;  Laterality: Left;  . TRANSURETHRAL RESECTION OF PROSTATE  05/14/2012   Procedure: TRANSURETHRAL RESECTION OF THE PROSTATE (TURP);  Surgeon: Marissa Nestle, MD;  Location: [facility];  Service: Urology;  Laterality: N/A;    Family History  Problem Relation Age of Onset  . Diabetes Mother     Social History   Socioeconomic History  . Marital status: Legally Separated    Spouse name: Not on file  . Number of children: Not on file  . Years of education: Not on file  . Highest education level: Not on file  Occupational History  . Not on file  Social Needs  . Financial resource strain: Not on file  . Food insecurity    Worry: Not on file    Inability: Not on file  . Transportation needs    Medical: Not on file    Non-medical: Not on file  Tobacco Use  . Smoking status: Current Every Day Smoker    Packs/day: 1.00    Years: 45.00    Pack years: 45.00    Types: Cigarettes  . Smokeless tobacco: Never Used  Substance and Sexual Activity  . Alcohol use: No  . Drug use: No  . Sexual activity: Not on file  Lifestyle  . Physical activity  Days per week: Not on file    Minutes per session: Not on file  . Stress: Not on file  Relationships  . Social Herbalist on phone: Not on file    Gets together: Not on file    Attends religious service: Not on file    Active member of club or organization: Not on file    Attends meetings of clubs or organizations: Not on file    Relationship status: Not on file  . Intimate partner violence    Fear of current or ex partner: Not on file    Emotionally abused: Not on file    Physically abused: Not on file    Forced sexual activity: Not on file  Other Topics Concern  . Not on  file  Social History Narrative  . Not on file    Outpatient Medications Prior to Visit  Medication Sig Dispense Refill  . acetaminophen (TYLENOL) 325 MG tablet Take 2 tablets (650 mg total) by mouth every 4 (four) hours as needed for headache or mild pain.    Marland Kitchen aspirin 81 MG EC tablet Take 1 tablet (81 mg total) by mouth daily. 30 tablet   . atorvastatin (LIPITOR) 10 MG tablet Take 1 tablet (10 mg total) by mouth daily at 6 PM. 90 tablet 1  . clopidogrel (PLAVIX) 75 MG tablet Take 1 tablet (75 mg total) by mouth daily. 30 tablet 4  . lisinopril (ZESTRIL) 5 MG tablet Take 1 tablet (5 mg total) by mouth daily. 90 tablet 1  . metFORMIN (GLUCOPHAGE) 850 MG tablet Take 1 tablet (850 mg total) by mouth 2 (two) times daily with a meal. 180 tablet 1   No facility-administered medications prior to visit.     No Known Allergies  ROS Review of Systems  Constitutional: Negative for fatigue and fever.  Eyes: Negative.   Respiratory: Negative.   Cardiovascular: Negative.   Gastrointestinal: Negative.   Endocrine: Negative.   Musculoskeletal:       BKA-left  Allergic/Immunologic: Negative.       Objective:    Physical Exam  Constitutional: He is oriented to person, place, and time. He appears well-developed and well-nourished.  HENT:  Head: Normocephalic and atraumatic.  Eyes: Conjunctivae are normal.  Cardiovascular: Normal rate and regular rhythm.  Pulmonary/Chest: Effort normal and breath sounds normal.  Neurological: He is oriented to person, place, and time.    BP 130/64 (BP Location: Left Arm, Patient Position: Sitting, Cuff Size: Normal)   Pulse 96   Temp 98.6 F (37 C) (Oral)   Ht 5\' 9"  (1.753 m)   Wt 185 lb (83.9 kg)   SpO2 95%   BMI 27.32 kg/m  Wt Readings from Last 3 Encounters:  02/24/19 185 lb (83.9 kg)  12/24/18 185 lb (83.9 kg)  12/09/18 185 lb (83.9 kg)     Health Maintenance Due  Topic Date Due  . FOOT EXAM  08/27/1952  . OPHTHALMOLOGY EXAM   08/27/1952  . TETANUS/TDAP  08/27/1961  . PNA vac Low Risk Adult (2 of 2 - PCV13) 05/15/2013  . INFLUENZA VACCINE  11/08/2018    Lab Results  Component Value Date   TSH 1.25 11/27/2018   Lab Results  Component Value Date   WBC 12.3 (H) 11/27/2018   HGB 9.6 (L) 11/27/2018   HCT 31.0 (L) 11/27/2018   MCV 84.2 11/27/2018   PLT 498 (H) 11/27/2018   Lab Results  Component Value Date   NA 141  11/27/2018   K 5.0 11/27/2018   CO2 26 11/27/2018   GLUCOSE 133 (H) 11/27/2018   BUN 17 11/27/2018   CREATININE 0.67 (L) 11/27/2018   BILITOT 0.2 11/27/2018   ALKPHOS 62 09/25/2018   AST 8 (L) 11/27/2018   ALT 6 (L) 11/27/2018   PROT 6.6 11/27/2018   ALBUMIN 2.6 (L) 09/25/2018   CALCIUM 9.6 11/27/2018   ANIONGAP 9 09/25/2018   Lab Results  Component Value Date   CHOL 152 11/27/2018   Lab Results  Component Value Date   HDL 44 11/27/2018   Lab Results  Component Value Date   LDLCALC 83 11/27/2018   Lab Results  Component Value Date   TRIG 148 11/27/2018   Lab Results  Component Value Date   CHOLHDL 3.5 11/27/2018   Lab Results  Component Value Date   HGBA1C 6.9 (A) 11/24/2018      Assessment & Plan:  1. Leukocytosis, unspecified type - CBC with Differential Wound site improved-recheck cbc for anemia and white count 2. 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 Essential hypertension zestril take daily-stable Follow-up: 3 months-can do virtual if needed with COVID   Uzma Hellmer Hannah Beat, MD

## 2019-02-24 NOTE — Patient Instructions (Signed)
labwork

## 2019-03-03 DIAGNOSIS — G546 Phantom limb syndrome with pain: Secondary | ICD-10-CM | POA: Diagnosis not present

## 2019-03-06 ENCOUNTER — Other Ambulatory Visit: Payer: Self-pay

## 2019-03-06 DIAGNOSIS — I779 Disorder of arteries and arterioles, unspecified: Secondary | ICD-10-CM

## 2019-03-10 ENCOUNTER — Ambulatory Visit (INDEPENDENT_AMBULATORY_CARE_PROVIDER_SITE_OTHER): Payer: Medicare HMO | Admitting: Vascular Surgery

## 2019-03-10 ENCOUNTER — Ambulatory Visit (HOSPITAL_COMMUNITY)
Admission: RE | Admit: 2019-03-10 | Discharge: 2019-03-10 | Disposition: A | Discharge status: Home / Self Care | Payer: Medicare HMO | Source: Ambulatory Visit | Attending: Family | Admitting: Family

## 2019-03-10 ENCOUNTER — Encounter: Payer: Self-pay | Admitting: Vascular Surgery

## 2019-03-10 ENCOUNTER — Other Ambulatory Visit: Payer: Self-pay

## 2019-03-10 VITALS — BP 117/67 | HR 96 | Temp 97.8°F | Resp 16 | Ht 69.0 in | Wt 190.0 lb

## 2019-03-10 DIAGNOSIS — I739 Peripheral vascular disease, unspecified: Secondary | ICD-10-CM | POA: Diagnosis not present

## 2019-03-10 DIAGNOSIS — I779 Disorder of arteries and arterioles, unspecified: Secondary | ICD-10-CM | POA: Diagnosis not present

## 2019-03-10 NOTE — Progress Notes (Signed)
Patient name: Thomas Garcia MRN: [id number] DOB: 10/20/1940 Sex: male  REASON FOR VISIT: 3 month follow-up for ongoing surveillance of PAD in right lower extremity  HPI: Thomas Garcia is a 78 y.o. male that presents for 3 month follow-up for PAD.  He previously underwent left lower extremity intervention and then a TMA for CLI with tissue loss.  Unfortunately this failed to heal and we had to convert him to a BKA on 09/22/18.  His BKA has healed and he is now using a prosthesis.  We have been following his right lower extremity as well given severe PAD in the right lower extremity.  Fortunately on follow-up today he denies any rest pain in the right lower extremity.  There is no known tissue loss.  He has limited ambulation with a walker using his left prosthesis and denies any pain in the right leg.  States he is trying to cut down smoking.  Past Medical History:  Diagnosis Date  . Diabetes mellitus without complication ([place])   . Kidney stones   . PONV (postoperative nausea and vomiting)     Past Surgical History:  Procedure Laterality Date  . ABDOMINAL AORTOGRAM W/LOWER EXTREMITY N/A 09/17/2018   Procedure: ABDOMINAL AORTOGRAM W/LOWER EXTREMITY;  Surgeon: Marty Heck, MD;  Location: [facility];  Service: Cardiovascular;  Laterality: N/A;  . AMPUTATION Left 09/22/2018   Procedure: AMPUTATION BELOW KNEE LEFT;  Surgeon: Marty Heck, MD;  Location: [facility];  Service: Vascular;  Laterality: Left;  . AMPUTATION TOE Left 09/10/2018   Procedure: AMPUTATION OF THIRD TOE LEFT FOOT, DEBRIDEMENT OF ULCERS ON SECOND TOE AND PARTIAL AMPUTATION SECOND TOE ON LEFT FOOT;  Surgeon: Virl Cagey, MD;  Location[facility];  Service: General;  Laterality: Left;  . APPLICATION OF WOUND VAC Left 09/18/2018   Procedure: APPLICATION OF WOUND VAC;  Surgeon: Marty Heck, MD;  Location: [facility];  Service: Vascular;  Laterality: Left;  . CYSTOSCOPY  05/08/2012   Procedure:  CYSTOSCOPY;  Surgeon: Marissa Nestle, MD;  Location: [facility];  Service: Urology;  Laterality: N/A;  Evacuation of clots  . KIDNEY SURGERY     sutures   . PERIPHERAL VASCULAR BALLOON ANGIOPLASTY  09/17/2018   Procedure: PERIPHERAL VASCULAR BALLOON ANGIOPLASTY;  Surgeon: Marty Heck, MD;  Location: [facility];  Service: Cardiovascular;;  LT SFA and AT  . SPLENECTOMY    . TRANSMETATARSAL AMPUTATION Left 09/18/2018   Procedure: TRANSMETATARSAL AMPUTATION LEFT FOOT;  Surgeon: Marty Heck, MD;  Location: [facility];  Service: Vascular;  Laterality: Left;  . TRANSURETHRAL RESECTION OF PROSTATE  05/14/2012   Procedure: TRANSURETHRAL RESECTION OF THE PROSTATE (TURP);  Surgeon: Marissa Nestle, MD;  Location: [facility];  Service: Urology;  Laterality: N/A;    Family History  Problem Relation Age of Onset  . Diabetes Mother     SOCIAL HISTORY: Social History   Tobacco Use  . Smoking status: Current Every Day Smoker    Packs/day: 1.00    Years: 45.00    Pack years: 45.00    Types: Cigarettes  . Smokeless tobacco: Never Used  Substance Use Topics  . Alcohol use: No    No Known Allergies  Current Outpatient Medications  Medication Sig Dispense Refill  . acetaminophen (TYLENOL) 325 MG tablet Take 2 tablets (650 mg total) by mouth every 4 (four) hours as needed for headache or mild pain.    Marland Kitchen aspirin 81 MG EC tablet  Take 1 tablet (81 mg total) by mouth daily. 30 tablet   . atorvastatin (LIPITOR) 10 MG tablet Take 1 tablet (10 mg total) by mouth daily at 6 PM. 90 tablet 1  . clopidogrel (PLAVIX) 75 MG tablet Take 1 tablet (75 mg total) by mouth daily. 90 tablet 1  . lisinopril (ZESTRIL) 5 MG tablet Take 1 tablet (5 mg total) by mouth daily. 90 tablet 1  . metFORMIN (GLUCOPHAGE) 850 MG tablet Take 1 tablet (850 mg total) by mouth 2 (two) times daily with a meal. 180 tablet 1   No current facility-administered medications for this visit.     REVIEW OF SYSTEMS:  [X]   denotes positive finding, [ ]  denotes negative finding Cardiac  Comments:  Chest pain or chest pressure:    Shortness of breath upon exertion:    Short of breath when lying flat:    Irregular heart rhythm:        Vascular    Pain in calf, thigh, or hip brought on by ambulation:    Pain in feet at night that wakes you up from your sleep:     Blood clot in your veins:    Leg swelling:         Pulmonary    Oxygen at home:    Productive cough:     Wheezing:         Neurologic    Sudden weakness in arms or legs:     Sudden numbness in arms or legs:     Sudden onset of difficulty speaking or slurred speech:    Temporary loss of vision in one eye:     Problems with dizziness:         Gastrointestinal    Blood in stool:     Vomited blood:         Genitourinary    Burning when urinating:     Blood in urine:        Psychiatric    Major depression:         Hematologic    Bleeding problems:    Problems with blood clotting too easily:        Skin    Rashes or ulcers:        Constitutional    Fever or chills:      PHYSICAL EXAM: Vitals:   03/10/19 1346  BP: 117/67  Pulse: 96  Resp: 16  Temp: 97.8 F (36.6 C)  TempSrc: Temporal  SpO2: 100%  Weight: 190 lb (86.2 kg)  Height: 5\' 9"  (1.753 m)    GENERAL: The patient is a well-nourished male, in no acute distress. The vital signs are documented above. CARDIAC: There is a regular rate and rhythm.  VASCULAR:  Left BKA - c/d/i Right DP/PT signals monophasic No new tissue loss right foot  DATA:   None  Assessment/Plan:  78 year old male well-known to the vascular surgery service.  Previously had left lower extremity intervention with TMA that failed to heal and had to be converted to a BKA.  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  Denies any rest pain or new tissue loss.  Discussed that I will see him back in 6 months with repeat ABIs.  He does  need to let our office know if he develops new symptoms in the right leg or develops a nonhealing wound that would require intervention before 6 months.  I discussed the importance of cutting  back on the smoking and he is trying to cut back.  Marty Heck, MD [facility] Office: [phone] Pager: [place]

## 2019-03-31 ENCOUNTER — Other Ambulatory Visit: Payer: Self-pay | Admitting: *Deleted

## 2019-03-31 DIAGNOSIS — I739 Peripheral vascular disease, unspecified: Secondary | ICD-10-CM

## 2019-04-02 DIAGNOSIS — G546 Phantom limb syndrome with pain: Secondary | ICD-10-CM | POA: Diagnosis not present

## 2019-05-03 DIAGNOSIS — G546 Phantom limb syndrome with pain: Secondary | ICD-10-CM | POA: Diagnosis not present

## 2019-05-27 ENCOUNTER — Ambulatory Visit: Payer: Medicare HMO | Admitting: Family Medicine

## 2019-06-03 DIAGNOSIS — G546 Phantom limb syndrome with pain: Secondary | ICD-10-CM | POA: Diagnosis not present

## 2019-06-22 ENCOUNTER — Other Ambulatory Visit: Payer: Self-pay

## 2019-06-22 ENCOUNTER — Encounter: Payer: Self-pay | Admitting: Family Medicine

## 2019-06-22 ENCOUNTER — Ambulatory Visit (INDEPENDENT_AMBULATORY_CARE_PROVIDER_SITE_OTHER): Payer: Medicare HMO | Admitting: Family Medicine

## 2019-06-22 VITALS — BP 139/58 | HR 85 | Temp 98.9°F | Ht 68.0 in | Wt 190.0 lb

## 2019-06-22 DIAGNOSIS — E1151 Type 2 diabetes mellitus with diabetic peripheral angiopathy without gangrene: Secondary | ICD-10-CM | POA: Diagnosis not present

## 2019-06-22 DIAGNOSIS — Z1211 Encounter for screening for malignant neoplasm of colon: Secondary | ICD-10-CM

## 2019-06-22 DIAGNOSIS — I10 Essential (primary) hypertension: Secondary | ICD-10-CM

## 2019-06-22 DIAGNOSIS — D72828 Other elevated white blood cell count: Secondary | ICD-10-CM

## 2019-06-22 MED ORDER — TRAZODONE HCL 50 MG PO TABS: ORAL_TABLET | ORAL | 3 refills | Status: DC

## 2019-06-22 MED ORDER — NICOTINE 14 MG/24HR TD PT24: 14.0000 mg | MEDICATED_PATCH | Freq: Every day | TRANSDERMAL | 0 refills | Status: DC

## 2019-06-22 NOTE — Progress Notes (Signed)
Established Patient Office Visit  Subjective:  Patient ID: Thomas Garcia, male    DOB: April 12, 1940  Age: 79 y.o. MRN: [id number]  CC:  Chief Complaint  Patient presents with  . Follow-up    3 month f/u on diabetes  . Constipation    having trouble going to the bathroom for a month and a half  . Insomnia    trouble sleeping. Sleep for 2 hr be up for 3 then sleep 3 hrs then up for 2 hr    HPI [name] presents for tob cessation-needs a patch-took in the hospital  Insomnia-go to bed at 11pm-wake up , get up-watch TV, back to sleep-no SOB. Worry about things-financial problems keeping pt awake.   Constipation-having difficulty with size of stool. Taking exlax to poop. Pt states pain with bowel movements. No change in diet. Pt does not want a colonoscopy.  No blood in stool  Past Medical History:  Diagnosis Date  . Diabetes mellitus without complication ([place])   . Kidney stones   . PONV (postoperative nausea and vomiting)     Past Surgical History:  Procedure Laterality Date  . ABDOMINAL AORTOGRAM W/LOWER EXTREMITY N/A 09/17/2018   Procedure: ABDOMINAL AORTOGRAM W/LOWER EXTREMITY;  Surgeon: Marty Heck, MD;  Location: [facility];  Service: Cardiovascular;  Laterality: N/A;  . AMPUTATION Left 09/22/2018   Procedure: AMPUTATION BELOW KNEE LEFT;  Surgeon: Marty Heck, MD;  Location: [facility];  Service: Vascular;  Laterality: Left;  . AMPUTATION TOE Left 09/10/2018   Procedure: AMPUTATION OF THIRD TOE LEFT FOOT, DEBRIDEMENT OF ULCERS ON SECOND TOE AND PARTIAL AMPUTATION SECOND TOE ON LEFT FOOT;  Surgeon: Virl Cagey, MD;  [facility];  Service: General;  Laterality: Left;  . APPLICATION OF WOUND VAC Left 09/18/2018   Procedure: APPLICATION OF WOUND VAC;  Surgeon: Marty Heck, MD;  Location: [facility];  Service: Vascular;  Laterality: Left;  . CYSTOSCOPY  05/08/2012   Procedure: CYSTOSCOPY;  Surgeon: Marissa Nestle, MD;  [facility];   Service: Urology;  Laterality: N/A;  Evacuation of clots  . KIDNEY SURGERY     sutures   . PERIPHERAL VASCULAR BALLOON ANGIOPLASTY  09/17/2018   Procedure: PERIPHERAL VASCULAR BALLOON ANGIOPLASTY;  Surgeon: Marty Heck, MD;  Location: [facility];  Service: Cardiovascular;;  LT SFA and AT  . SPLENECTOMY    . TRANSMETATARSAL AMPUTATION Left 09/18/2018   Procedure: TRANSMETATARSAL AMPUTATION LEFT FOOT;  Surgeon: Marty Heck, MD;  Location: [facility];  Service: Vascular;  Laterality: Left;  . TRANSURETHRAL RESECTION OF PROSTATE  05/14/2012   Procedure: TRANSURETHRAL RESECTION OF THE PROSTATE (TURP);  Surgeon: Marissa Nestle, MD;  Location[facility];  Service: Urology;  Laterality: N/A;    Family History  Problem Relation Age of Onset  . Diabetes Mother     Social History   Socioeconomic History  . Marital status: Legally Separated    Spouse name: Not on file  . Number of children: Not on file  . Years of education: Not on file  . Highest education level: Not on file  Occupational History  . Not on file  Tobacco Use  . Smoking status: Current Every Day Smoker    Packs/day: 1.00    Years: 45.00    Pack years: 45.00    Types: Cigarettes  . Smokeless tobacco: Never Used  Substance and Sexual Activity  . Alcohol use: No  . Drug use: No  .  Sexual activity: Not on file  Other Topics Concern  . Not on file  Social History Narrative  . Not on file   Social Determinants of Health   Financial Resource Strain:   . Difficulty of Paying Living Expenses:   Food Insecurity:   . Worried About Charity fundraiser in the Last Year:   . Arboriculturist in the Last Year:   Transportation Needs:   . Film/video editor (Medical):   Marland Kitchen Lack of Transportation (Non-Medical):   Physical Activity:   . Days of Exercise per Week:   . Minutes of Exercise per Session:   Stress:   . Feeling of Stress :   Social Connections:   . Frequency of Communication with Friends and  Family:   . Frequency of Social Gatherings with Friends and Family:   . Attends Religious Services:   . Active Member of Clubs or Organizations:   . Attends Archivist Meetings:   Marland Kitchen Marital Status:   Intimate Partner Violence:   . Fear of Current or Ex-Partner:   . Emotionally Abused:   Marland Kitchen Physically Abused:   . Sexually Abused:     Outpatient Medications Prior to Visit  Medication Sig Dispense Refill  . acetaminophen (TYLENOL) 325 MG tablet Take 2 tablets (650 mg total) by mouth every 4 (four) hours as needed for headache or mild pain.    Marland Kitchen aspirin 81 MG EC tablet Take 1 tablet (81 mg total) by mouth daily. 30 tablet   . atorvastatin (LIPITOR) 10 MG tablet Take 1 tablet (10 mg total) by mouth daily at 6 PM. 90 tablet 1  . clopidogrel (PLAVIX) 75 MG tablet Take 1 tablet (75 mg total) by mouth daily. 90 tablet 1  . lisinopril (ZESTRIL) 5 MG tablet Take 1 tablet (5 mg total) by mouth daily. 90 tablet 1  . metFORMIN (GLUCOPHAGE) 850 MG tablet Take 1 tablet (850 mg total) by mouth 2 (two) times daily with a meal. 180 tablet 1   No facility-administered medications prior to visit.    No Known Allergies  ROS Review of Systems  HENT: Negative.   Eyes:       No recent vision exam  Respiratory: Negative.   Cardiovascular: Negative.   Gastrointestinal: Positive for constipation.  Endocrine:       DM  Genitourinary: Negative.   Musculoskeletal:       BKA  Psychiatric/Behavioral:       Insomnia      Objective:    Physical Exam  Constitutional: He is oriented to person, place, and time. He appears well-developed and well-nourished.  Cardiovascular: Normal rate, regular rhythm and normal heart sounds.  Pulmonary/Chest: Effort normal.  Musculoskeletal:     Comments: BKA-left leg  Neurological: He is oriented to person, place, and time.  Psychiatric: He has a normal mood and affect. His behavior is normal.    BP (!) 139/58 (BP Location: Right Arm, Patient Position:  Sitting, Cuff Size: Normal)   Pulse 85   Temp 98.9 F (37.2 C) (Temporal)   Ht 5\' 8"  (1.727 m)   Wt 190 lb (86.2 kg)   SpO2 96%   BMI 28.89 kg/m  Wt Readings from Last 3 Encounters:  06/22/19 190 lb (86.2 kg)  03/10/19 190 lb (86.2 kg)  02/24/19 185 lb (83.9 kg)     Health Maintenance Due  Topic Date Due  . FOOT EXAM  Never done  . OPHTHALMOLOGY EXAM  Never done  .  TETANUS/TDAP  Never done    Lab Results  Component Value Date   TSH 1.25 11/27/2018   Lab Results  Component Value Date   WBC 12.3 (H) 11/27/2018   HGB 9.6 (L) 11/27/2018   HCT 31.0 (L) 11/27/2018   MCV 84.2 11/27/2018   PLT 498 (H) 11/27/2018   Lab Results  Component Value Date   NA 141 11/27/2018   K 5.0 11/27/2018   CO2 26 11/27/2018   GLUCOSE 133 (H) 11/27/2018   BUN 17 11/27/2018   CREATININE 0.67 (L) 11/27/2018   BILITOT 0.2 11/27/2018   ALKPHOS 62 09/25/2018   AST 8 (L) 11/27/2018   ALT 6 (L) 11/27/2018   PROT 6.6 11/27/2018   ALBUMIN 2.6 (L) 09/25/2018   CALCIUM 9.6 11/27/2018   ANIONGAP 9 09/25/2018   Lab Results  Component Value Date   CHOL 152 11/27/2018   Lab Results  Component Value Date   HDL 44 11/27/2018   Lab Results  Component Value Date   LDLCALC 83 11/27/2018   Lab Results  Component Value Date   TRIG 148 11/27/2018   Lab Results  Component Value Date   CHOLHDL 3.5 11/27/2018   Lab Results  Component Value Date   HGBA1C 5.8 (A) 02/24/2019      Assessment & Plan:   1. Colon cancer screening - Cologuard 2. Benign essential HTN - Urinalysis - COMPLETE METABOLIC PANEL WITH GFR Stable blood pressure-pt does not take readings at home 3. 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 Other elevated white blood cell (WBC) count Follow up-appt to see surgeon to evaluate wound - CBC w/Diff/Platelet Follow-up: surgery  Janaysha Depaulo Hannah Beat, MD

## 2019-06-22 NOTE — Patient Instructions (Addendum)
COVID-19 Vaccine Information can be found at: [email or web address] For questions related to vaccine distribution or appointments, please email [email or web address] or call [phone].   Trial Trazodone 50mg  daily  For constipation-trial of miralax daily-trial of prune juice-need at least 8 oz of liquid

## 2019-07-01 DIAGNOSIS — G546 Phantom limb syndrome with pain: Secondary | ICD-10-CM | POA: Diagnosis not present

## 2019-08-01 DIAGNOSIS — G546 Phantom limb syndrome with pain: Secondary | ICD-10-CM | POA: Diagnosis not present

## 2019-08-25 ENCOUNTER — Telehealth: Payer: Self-pay

## 2019-08-25 NOTE — Telephone Encounter (Signed)
Left voice message to remind patient to do Cologuard stool sample as the lab has yet to receive the sample.

## 2019-08-31 DIAGNOSIS — G546 Phantom limb syndrome with pain: Secondary | ICD-10-CM | POA: Diagnosis not present

## 2019-10-01 DIAGNOSIS — G546 Phantom limb syndrome with pain: Secondary | ICD-10-CM | POA: Diagnosis not present

## 2019-10-19 ENCOUNTER — Other Ambulatory Visit: Payer: Self-pay | Admitting: Family Medicine

## 2019-10-31 DIAGNOSIS — G546 Phantom limb syndrome with pain: Secondary | ICD-10-CM | POA: Diagnosis not present

## 2019-11-30 ENCOUNTER — Encounter (INDEPENDENT_AMBULATORY_CARE_PROVIDER_SITE_OTHER): Payer: Self-pay | Admitting: Internal Medicine

## 2019-11-30 ENCOUNTER — Ambulatory Visit (INDEPENDENT_AMBULATORY_CARE_PROVIDER_SITE_OTHER): Payer: Medicare HMO | Admitting: Internal Medicine

## 2019-11-30 ENCOUNTER — Other Ambulatory Visit: Payer: Self-pay

## 2019-11-30 VITALS — BP 138/70 | HR 111 | Temp 97.1°F | Resp 20 | Ht 69.0 in | Wt 178.0 lb

## 2019-11-30 DIAGNOSIS — I10 Essential (primary) hypertension: Secondary | ICD-10-CM | POA: Diagnosis not present

## 2019-11-30 DIAGNOSIS — I739 Peripheral vascular disease, unspecified: Secondary | ICD-10-CM

## 2019-11-30 DIAGNOSIS — E559 Vitamin D deficiency, unspecified: Secondary | ICD-10-CM

## 2019-11-30 DIAGNOSIS — Z720 Tobacco use: Secondary | ICD-10-CM

## 2019-11-30 DIAGNOSIS — E782 Mixed hyperlipidemia: Secondary | ICD-10-CM

## 2019-11-30 DIAGNOSIS — E119 Type 2 diabetes mellitus without complications: Secondary | ICD-10-CM

## 2019-11-30 MED ORDER — TRAZODONE HCL 50 MG PO TABS: ORAL_TABLET | ORAL | 3 refills | Status: DC

## 2019-11-30 MED ORDER — NICOTINE 14 MG/24HR TD PT24: 14.0000 mg | MEDICATED_PATCH | Freq: Every day | TRANSDERMAL | 3 refills | Status: DC

## 2019-11-30 NOTE — Progress Notes (Signed)
Metrics: Intervention Frequency ACO  Documented Smoking Status Yearly  Screened one or more times in 24 months  Cessation Counseling or  Active cessation medication Past 24 months  Past 24 months   Guideline developer: UpToDate (See UpToDate for funding source) Date Released: 2014       Wellness Office Visit  Subjective:  Patient ID: Thomas Garcia, male    DOB: 1941-03-25  Age: 79 y.o. MRN: [id number]  CC: This 79 year old man comes to our practice as a new patient to establish care. 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 He would like to quit smoking and is down to half a pack of cigarettes a day.  He wants to try nicotine patch. He also needs refill of his trazodone to help him sleep.  Past Medical History:  Diagnosis Date  . Diabetes mellitus without complication ([place])   . Kidney stones   . PONV (postoperative nausea and vomiting)    Past Surgical History:  Procedure Laterality Date  . ABDOMINAL AORTOGRAM W/LOWER EXTREMITY N/A 09/17/2018   Procedure: ABDOMINAL AORTOGRAM W/LOWER EXTREMITY;  Surgeon: Marty Heck, MD;  Location: [place] [facility];  Service: Cardiovascular;  Laterality: N/A;  . AMPUTATION Left 09/22/2018   Procedure: AMPUTATION BELOW KNEE LEFT;  Surgeon: Marty Heck, MD;  Location: [place];  Service: Vascular;  Laterality: Left;  . AMPUTATION TOE Left 09/10/2018   Procedure: AMPUTATION OF THIRD TOE LEFT FOOT, DEBRIDEMENT OF ULCERS ON SECOND TOE AND PARTIAL AMPUTATION SECOND TOE ON LEFT FOOT;  Surgeon: Virl Cagey, MD;  [facility];  Service: General;  Laterality: Left;  . APPLICATION OF WOUND VAC Left 09/18/2018   Procedure: APPLICATION OF WOUND VAC;  Surgeon: Marty Heck, MD;  Location: [place];  Service: Vascular;  Laterality: Left;  . CYSTOSCOPY  05/08/2012   Procedure: CYSTOSCOPY;   Surgeon: Marissa Nestle, MD;  [facility]: [facility];  Service: Urology;  Laterality: N/A;  Evacuation of clots  . KIDNEY SURGERY     sutures   . PERIPHERAL VASCULAR BALLOON ANGIOPLASTY  09/17/2018   Procedure: PERIPHERAL VASCULAR BALLOON ANGIOPLASTY;  Surgeon: Marty Heck, MD;  Location: [place] [facility];  Service: Cardiovascular;;  LT SFA and AT  . SPLENECTOMY    . TRANSMETATARSAL AMPUTATION Left 09/18/2018   Procedure: TRANSMETATARSAL AMPUTATION LEFT FOOT;  Surgeon: Marty Heck, MD;  Location: [place];  Service: Vascular;  Laterality: Left;  . TRANSURETHRAL RESECTION OF PROSTATE  05/14/2012   Procedure: TRANSURETHRAL RESECTION OF THE PROSTATE (TURP);  Surgeon: Marissa Nestle, MD;  [facility];  Service: Urology;  Laterality: N/A;     Family History  Problem Relation Age of Onset  . Diabetes Mother     Social History   Social History Narrative   Divorced since 2011.Lives with brother.Retired,previously maintenance work for [employer]   Social History   Tobacco Use  . Smoking status: Current Every Day Smoker    Packs/day: 0.50    Years: 45.00    Pack years: 22.50    Types: Cigarettes  . Smokeless tobacco: Never Used  Substance Use Topics  . Alcohol use: No    Current Meds  Medication Sig  . acetaminophen (TYLENOL) 325 MG tablet Take 2 tablets (650 mg total) by mouth every 4 (four) hours as needed for headache or mild pain.  Marland Kitchen aspirin 81 MG EC  tablet Take 1 tablet (81 mg total) by mouth daily.  Marland Kitchen atorvastatin (LIPITOR) 10 MG tablet Take 1 tablet (10 mg total) by mouth daily at 6 PM.  . clopidogrel (PLAVIX) 75 MG tablet Take 1 tablet (75 mg total) by mouth daily.  Marland Kitchen lisinopril (ZESTRIL) 5 MG tablet Take 1 tablet (5 mg total) by mouth daily.  . metFORMIN (GLUCOPHAGE) 850 MG tablet Take 1 tablet (850 mg total) by mouth 2 (two) times daily with a meal.  . traZODone (DESYREL) 50 MG tablet Take at night  . [DISCONTINUED] traZODone (DESYREL) 50 MG tablet  Take at night      Depression screen [facility] 2/9 06/22/2019 11/24/2018  Decreased Interest 0 0  Down, Depressed, Hopeless 0 0  PHQ - 2 Score 0 0     Objective:   Today's Vitals: BP 138/70 (BP Location: Right Arm, Patient Position: Sitting, Cuff Size: Normal)   Pulse (!) 111   Temp (!) 97.1 F (36.2 C) (Temporal)   Resp 20   Ht 5\' 9"  (1.753 m)   Wt 178 lb (80.7 kg)   SpO2 98%   BMI 26.29 kg/m  Vitals with BMI 11/30/2019 06/22/2019 03/10/2019  Height 5\' 9"  5\' 8"  5\' 9"   Weight 178 lbs 190 lbs 190 lbs  BMI 26.27 19.5 09.32  Systolic 671 245 809  Diastolic 70 58 67  Pulse 983 85 96     Physical Exam  He looks frail and somewhat older than his stated age.  Blood pressure is reasonable for his age.  Alert and orientated without any obvious focal neurological signs.     Assessment   1. Diabetes mellitus type 2 in nonobese (HCC)   2. PVD (peripheral vascular disease) ([place])   3. Essential hypertension   4. Tobacco abuse   5. Mixed hyperlipidemia   6. Vitamin D deficiency disease       Tests ordered Orders Placed This Encounter  Procedures  . CBC  . COMPLETE METABOLIC PANEL WITH GFR  . Hemoglobin A1c  . Lipid panel  . VITAMIN D 25 Hydroxy (Vit-D Deficiency, Fractures)     Plan: 1. He will continue with Metformin for his diabetes and we will check a hemoglobin A1c. 2. He will continue with lisinopril and we will check renal function.  Blood pressure seems to be stable on this medication. 3. He will continue with Lipitor and we will check a lipid panel today. 4. Further recommendations will depend on blood results and I will have him follow-up with Sarah in 3 months time.   Meds ordered this encounter  Medications  . nicotine (NICODERM CQ) 14 mg/24hr patch    Sig: Place 1 patch (14 mg total) onto the skin daily.    Dispense:  28 patch    Refill:  3  . traZODone (DESYREL) 50 MG tablet    Sig: Take at night    Dispense:  30 tablet    Refill:  3    Makala Fetterolf Luther Parody, MD

## 2019-12-01 DIAGNOSIS — G546 Phantom limb syndrome with pain: Secondary | ICD-10-CM | POA: Diagnosis not present

## 2019-12-01 LAB — COMPLETE METABOLIC PANEL WITH GFR
AG Ratio: 1 (calc) (ref 1.0–2.5)
ALT: 6 U/L — ABNORMAL LOW (ref 9–46)
AST: 10 U/L (ref 10–35)
Albumin: 3.5 g/dL — ABNORMAL LOW (ref 3.6–5.1)
Alkaline phosphatase (APISO): 84 U/L (ref 35–144)
BUN: 14 mg/dL (ref 7–25)
CO2: 24 mmol/L (ref 20–32)
Calcium: 9.6 mg/dL (ref 8.6–10.3)
Chloride: 100 mmol/L (ref 98–110)
Creat: 0.74 mg/dL (ref 0.70–1.18)
GFR, Est African American: 103 mL/min/{1.73_m2} (ref >=60)
GFR, Est Non African American: 89 mL/min/{1.73_m2} (ref >=60)
Globulin: 3.6 g/dL (calc) (ref 1.9–3.7)
Glucose, Bld: 105 mg/dL — ABNORMAL HIGH (ref 65–99)
Potassium: 5.3 mmol/L (ref 3.5–5.3)
Sodium: 136 mmol/L (ref 135–146)
Total Bilirubin: 0.2 mg/dL (ref 0.2–1.2)
Total Protein: 7.1 g/dL (ref 6.1–8.1)

## 2019-12-01 LAB — CBC
HCT: 26.1 % — ABNORMAL LOW (ref 38.5–50.0)
Hemoglobin: 7.3 g/dL — ABNORMAL LOW (ref 13.2–17.1)
MCH: 19.4 pg — ABNORMAL LOW (ref 27.0–33.0)
MCHC: 28 g/dL — ABNORMAL LOW (ref 32.0–36.0)
MCV: 69.4 fL — ABNORMAL LOW (ref 80.0–100.0)
MPV: 10.4 fL (ref 7.5–12.5)
Platelets: 667 10*3/uL — ABNORMAL HIGH (ref 140–400)
RBC: 3.76 10*6/uL — ABNORMAL LOW (ref 4.20–5.80)
RDW: 24.2 % — ABNORMAL HIGH (ref 11.0–15.0)
WBC: 10.3 10*3/uL (ref 3.8–10.8)

## 2019-12-01 LAB — HEMOGLOBIN A1C
Hgb A1c MFr Bld: 6.9 % of total Hgb — ABNORMAL HIGH (ref <5.7)
Mean Plasma Glucose: 151 (calc)
eAG (mmol/L): 8.4 (calc)

## 2019-12-01 LAB — LIPID PANEL
Cholesterol: 98 mg/dL (ref <200)
HDL: 45 mg/dL (ref >=40)
LDL Cholesterol (Calc): 35 mg/dL (calc)
Non-HDL Cholesterol (Calc): 53 mg/dL (calc) (ref <130)
Total CHOL/HDL Ratio: 2.2 (calc) (ref <5.0)
Triglycerides: 99 mg/dL (ref <150)

## 2019-12-01 LAB — SPECIMEN COMPROMISED

## 2019-12-01 LAB — VITAMIN D 25 HYDROXY (VIT D DEFICIENCY, FRACTURES): Vit D, 25-Hydroxy: 10 ng/mL — ABNORMAL LOW (ref 30–100)

## 2019-12-01 NOTE — Progress Notes (Signed)
Patient called.  Left message on home machine. Noted labs results. Pt was told to go on mychart to review labs as well. I am setting a appt to see sarah for next week.

## 2019-12-01 NOTE — Progress Notes (Signed)
Please call this patient and tell him that he needs to come back in as he is severely anemic.  Schedule him an appointment either with me or Judson Roch next week.  Thanks.

## 2019-12-07 ENCOUNTER — Other Ambulatory Visit: Payer: Self-pay

## 2019-12-07 ENCOUNTER — Encounter (INDEPENDENT_AMBULATORY_CARE_PROVIDER_SITE_OTHER): Payer: Self-pay | Admitting: Internal Medicine

## 2019-12-07 ENCOUNTER — Ambulatory Visit (INDEPENDENT_AMBULATORY_CARE_PROVIDER_SITE_OTHER): Payer: Medicare HMO | Admitting: Internal Medicine

## 2019-12-07 VITALS — BP 125/70 | HR 94 | Temp 97.9°F | Ht 69.0 in | Wt 178.0 lb

## 2019-12-07 DIAGNOSIS — R042 Hemoptysis: Secondary | ICD-10-CM | POA: Diagnosis not present

## 2019-12-07 DIAGNOSIS — D649 Anemia, unspecified: Secondary | ICD-10-CM

## 2019-12-07 NOTE — Progress Notes (Signed)
Metrics: Intervention Frequency ACO  Documented Smoking Status Yearly  Screened one or more times in 24 months  Cessation Counseling or  Active cessation medication Past 24 months  Past 24 months   Guideline developer: UpToDate (See UpToDate for funding source) Date Released: 2014       Wellness Office Visit  Subjective:  Patient ID: Thomas Garcia, male    DOB: Mar 14, 1941  Age: 79 y.o. MRN: [id number]  CC: I brought this man back for an acute visit because of his lab work which showed severe anemia. HPI  When I saw him on the last visit, blood work showed that he had a hemoglobin of 7.3 which is significantly reduced from approximately 1 year ago when it was 9.6. He denies any GI bleeding either frank red blood or melena.  However he does tell me that he does have hemoptysis on most days and he has been having this for approximately 18 months He is a smoker. He denies any significant weight loss. Past Medical History:  Diagnosis Date  . Diabetes mellitus without complication ([place])   . Kidney stones   . PONV (postoperative nausea and vomiting)    Past Surgical History:  Procedure Laterality Date  . ABDOMINAL AORTOGRAM W/LOWER EXTREMITY N/A 09/17/2018   Procedure: ABDOMINAL AORTOGRAM W/LOWER EXTREMITY;  Surgeon: Marty Heck, MD;  Location: [place] [facility];  Service: Cardiovascular;  Laterality: N/A;  . AMPUTATION Left 09/22/2018   Procedure: AMPUTATION BELOW KNEE LEFT;  Surgeon: Marty Heck, MD;  Location: [place];  Service: Vascular;  Laterality: Left;  . AMPUTATION TOE Left 09/10/2018   Procedure: AMPUTATION OF THIRD TOE LEFT FOOT, DEBRIDEMENT OF ULCERS ON SECOND TOE AND PARTIAL AMPUTATION SECOND TOE ON LEFT FOOT;  Surgeon: Virl Cagey, MD;  [facility];  Service: General;  Laterality: Left;  . APPLICATION OF WOUND VAC Left 09/18/2018   Procedure: APPLICATION OF WOUND VAC;  Surgeon: Marty Heck, MD;  Location: [place];  Service: Vascular;   Laterality: Left;  . CYSTOSCOPY  05/08/2012   Procedure: CYSTOSCOPY;  Surgeon: Marissa Nestle, MD;  [facility];  Service: Urology;  Laterality: N/A;  Evacuation of clots  . KIDNEY SURGERY     sutures   . PERIPHERAL VASCULAR BALLOON ANGIOPLASTY  09/17/2018   Procedure: PERIPHERAL VASCULAR BALLOON ANGIOPLASTY;  Surgeon: Marty Heck, MD;  Location: [place] [facility];  Service: Cardiovascular;;  LT SFA and AT  . SPLENECTOMY    . TRANSMETATARSAL AMPUTATION Left 09/18/2018   Procedure: TRANSMETATARSAL AMPUTATION LEFT FOOT;  Surgeon: Marty Heck, MD;  Location: [place];  Service: Vascular;  Laterality: Left;  . TRANSURETHRAL RESECTION OF PROSTATE  05/14/2012   Procedure: TRANSURETHRAL RESECTION OF THE PROSTATE (TURP);  Surgeon: Marissa Nestle, MD;  [facility];  Service: Urology;  Laterality: N/A;     Family History  Problem Relation Age of Onset  . Diabetes Mother     Social History   Social History Narrative   Divorced since 2011.Lives with brother.Retired,previously maintenance work for [employer]   Social History   Tobacco Use  . Smoking status: Current Every Day Smoker    Packs/day: 0.50    Years: 45.00    Pack years: 22.50    Types: Cigarettes  . Smokeless tobacco: Never Used  Substance Use Topics  . Alcohol use: No    Current Meds  Medication Sig  . acetaminophen (TYLENOL) 325 MG tablet Take 2 tablets (650 mg total)  by mouth every 4 (four) hours as needed for headache or mild pain.  Marland Kitchen aspirin 81 MG EC tablet Take 1 tablet (81 mg total) by mouth daily.  Marland Kitchen atorvastatin (LIPITOR) 10 MG tablet Take 1 tablet (10 mg total) by mouth daily at 6 PM.  . clopidogrel (PLAVIX) 75 MG tablet Take 1 tablet (75 mg total) by mouth daily.  Marland Kitchen lisinopril (ZESTRIL) 5 MG tablet Take 1 tablet (5 mg total) by mouth daily.  . metFORMIN (GLUCOPHAGE) 850 MG tablet Take 1 tablet (850 mg total) by mouth 2 (two) times daily with a meal.  . nicotine (NICODERM CQ) 14  mg/24hr patch Place 1 patch (14 mg total) onto the skin daily.  . traZODone (DESYREL) 50 MG tablet Take at night      Depression screen [facility] 2/9 06/22/2019 11/24/2018  Decreased Interest 0 0  Down, Depressed, Hopeless 0 0  PHQ - 2 Score 0 0     Objective:   Today's Vitals: BP 125/70 (BP Location: Left Arm, Patient Position: Sitting, Cuff Size: Normal)   Pulse 94   Temp 97.9 F (36.6 C) (Temporal)   Ht 5\' 9"  (1.753 m)   Wt 178 lb (80.7 kg)   SpO2 91%   BMI 26.29 kg/m  Vitals with BMI 12/07/2019 11/30/2019 06/22/2019  Height 5\' 9"  5\' 9"  5\' 8"   Weight 178 lbs 178 lbs 190 lbs  BMI 26.27 47.42 59.5  Systolic 638 756 433  Diastolic 70 70 58  Pulse 94 111 85     Physical Exam  He looks frail.  Weight is stable.  Blood pressure is in good control.     Assessment   1. Anemia, unspecified type   2. Hemoptysis       Tests ordered Orders Placed This Encounter  Procedures  . DG Chest 2 View  . Iron,Total/Total Iron Binding Cap  . B12 and Folate Panel  . CBC     Plan: 1. We will repeat his CBC and also B12, folate and iron studies. 2. I will send him for chest x-ray and we will go from there.  He may need a CT scan of the chest eventually.  I have warned him of this.   No orders of the defined types were placed in this encounter.   Doree Albee, MD

## 2019-12-08 ENCOUNTER — Emergency Department (HOSPITAL_COMMUNITY)
Admission: EM | Admit: 2019-12-08 | Discharge: 2019-12-08 | Disposition: A | Discharge status: Home / Self Care | Payer: Medicare HMO | Attending: Emergency Medicine | Admitting: Emergency Medicine

## 2019-12-08 ENCOUNTER — Other Ambulatory Visit: Payer: Self-pay

## 2019-12-08 ENCOUNTER — Encounter (HOSPITAL_COMMUNITY): Payer: Self-pay | Admitting: *Deleted

## 2019-12-08 DIAGNOSIS — Z7984 Long term (current) use of oral hypoglycemic drugs: Secondary | ICD-10-CM | POA: Diagnosis not present

## 2019-12-08 DIAGNOSIS — I10 Essential (primary) hypertension: Secondary | ICD-10-CM | POA: Diagnosis not present

## 2019-12-08 DIAGNOSIS — F17210 Nicotine dependence, cigarettes, uncomplicated: Secondary | ICD-10-CM | POA: Insufficient documentation

## 2019-12-08 DIAGNOSIS — D649 Anemia, unspecified: Secondary | ICD-10-CM | POA: Diagnosis not present

## 2019-12-08 DIAGNOSIS — E1140 Type 2 diabetes mellitus with diabetic neuropathy, unspecified: Secondary | ICD-10-CM | POA: Diagnosis not present

## 2019-12-08 DIAGNOSIS — Z79899 Other long term (current) drug therapy: Secondary | ICD-10-CM | POA: Insufficient documentation

## 2019-12-08 DIAGNOSIS — Z7982 Long term (current) use of aspirin: Secondary | ICD-10-CM | POA: Diagnosis not present

## 2019-12-08 LAB — RETICULOCYTES
Immature Retic Fract: 34 % — ABNORMAL HIGH (ref 2.3–15.9)
RBC.: 3.38 MIL/uL — ABNORMAL LOW (ref 4.22–5.81)
Retic Count, Absolute: 77.1 10*3/uL (ref 19.0–186.0)
Retic Ct Pct: 2.3 % (ref 0.4–3.1)

## 2019-12-08 LAB — CBC
HCT: 23.9 % — ABNORMAL LOW (ref 38.5–50.0)
Hemoglobin: 6.6 g/dL — ABNORMAL LOW (ref 13.2–17.1)
MCH: 19.5 pg — ABNORMAL LOW (ref 27.0–33.0)
MCHC: 27.6 g/dL — ABNORMAL LOW (ref 32.0–36.0)
MCV: 70.5 fL — ABNORMAL LOW (ref 80.0–100.0)
MPV: 10 fL (ref 7.5–12.5)
Platelets: 608 10*3/uL — ABNORMAL HIGH (ref 140–400)
RBC: 3.39 10*6/uL — ABNORMAL LOW (ref 4.20–5.80)
RDW: 22.7 % — ABNORMAL HIGH (ref 11.0–15.0)
WBC: 9.8 10*3/uL (ref 3.8–10.8)

## 2019-12-08 LAB — CBC WITH DIFFERENTIAL/PLATELET
Abs Immature Granulocytes: 0.03 10*3/uL (ref 0.00–0.07)
Basophils Absolute: 0.1 10*3/uL (ref 0.0–0.1)
Basophils Relative: 1 %
Eosinophils Absolute: 0.3 10*3/uL (ref 0.0–0.5)
Eosinophils Relative: 3 %
HCT: 24.1 % — ABNORMAL LOW (ref 39.0–52.0)
Hemoglobin: 6.7 g/dL — CL (ref 13.0–17.0)
Immature Granulocytes: 0 %
Lymphocytes Relative: 23 %
Lymphs Abs: 2.3 10*3/uL (ref 0.7–4.0)
MCH: 19.8 pg — ABNORMAL LOW (ref 26.0–34.0)
MCHC: 27.8 g/dL — ABNORMAL LOW (ref 30.0–36.0)
MCV: 71.1 fL — ABNORMAL LOW (ref 80.0–100.0)
Monocytes Absolute: 0.9 10*3/uL (ref 0.1–1.0)
Monocytes Relative: 9 %
Neutro Abs: 6.6 10*3/uL (ref 1.7–7.7)
Neutrophils Relative %: 64 %
Platelets: 643 10*3/uL — ABNORMAL HIGH (ref 150–400)
RBC: 3.39 MIL/uL — ABNORMAL LOW (ref 4.22–5.81)
RDW: 25.2 % — ABNORMAL HIGH (ref 11.5–15.5)
WBC: 10.2 10*3/uL (ref 4.0–10.5)
nRBC: 0.5 % — ABNORMAL HIGH (ref 0.0–0.2)

## 2019-12-08 LAB — BASIC METABOLIC PANEL
Anion gap: 10 (ref 5–15)
BUN: 19 mg/dL (ref 8–23)
CO2: 25 mmol/L (ref 22–32)
Calcium: 8.9 mg/dL (ref 8.9–10.3)
Chloride: 102 mmol/L (ref 98–111)
Creatinine, Ser: 0.93 mg/dL (ref 0.61–1.24)
GFR calc Af Amer: >60 mL/min (ref >60)
GFR calc non Af Amer: >60 mL/min (ref >60)
Glucose, Bld: 261 mg/dL — ABNORMAL HIGH (ref 70–99)
Potassium: 4.4 mmol/L (ref 3.5–5.1)
Sodium: 137 mmol/L (ref 135–145)

## 2019-12-08 LAB — FOLATE: Folate: 9.5 ng/mL (ref >5.9)

## 2019-12-08 LAB — IRON AND TIBC
Iron: 10 ug/dL — ABNORMAL LOW (ref 45–182)
Saturation Ratios: 2 % — ABNORMAL LOW (ref 17.9–39.5)
TIBC: 425 ug/dL (ref 250–450)
UIBC: 415 ug/dL

## 2019-12-08 LAB — B12 AND FOLATE PANEL
Folate: 9.9 ng/mL
Vitamin B-12: 265 pg/mL (ref 200–1100)

## 2019-12-08 LAB — IRON, TOTAL/TOTAL IRON BINDING CAP
%SAT: 5 % (calc) — ABNORMAL LOW (ref 20–48)
Iron: 21 ug/dL — ABNORMAL LOW (ref 50–180)
TIBC: 411 mcg/dL (calc) (ref 250–425)

## 2019-12-08 LAB — PREPARE RBC (CROSSMATCH)

## 2019-12-08 LAB — VITAMIN B12: Vitamin B-12: 148 pg/mL — ABNORMAL LOW (ref 180–914)

## 2019-12-08 LAB — FERRITIN: Ferritin: 11 ng/mL — ABNORMAL LOW (ref 24–336)

## 2019-12-08 LAB — POC OCCULT BLOOD, ED: Fecal Occult Bld: NEGATIVE

## 2019-12-08 LAB — ABO/RH: ABO/RH(D): A POS

## 2019-12-08 MED ORDER — SODIUM CHLORIDE 0.9 % IV SOLN: 10.0000 mL/h | Freq: Once | INTRAVENOUS | Status: DC

## 2019-12-08 NOTE — Progress Notes (Signed)
I have already left a message on this patient's phone, but I need you to call him again and tell him to go to the emergency room immediately as his hemoglobin has dropped even further and he likely needs to be hospitalized.

## 2019-12-08 NOTE — Progress Notes (Signed)
Ca;;ed back. Pt said he will be going in about 5 mins from now. Do we need to notify ER providers?

## 2019-12-08 NOTE — ED Triage Notes (Signed)
Pt sent here due to hemoglobin is low.  Had labs drawn yesterday in the office.  Pt denies any symptoms.

## 2019-12-08 NOTE — ED Notes (Signed)
Date and time results received: 12/08/19 1350 (use smartphrase ".now" to insert current time)  Test: hgb Critical Value: 6.7  Name of Provider Notified: Dr. Stark Jock  Orders Received? Or Actions Taken?: na

## 2019-12-08 NOTE — ED Provider Notes (Signed)
[facility] EMERGENCY DEPARTMENT Provider Note   CSN: [id number] Arrival date & time: 12/08/19  1144     History Chief Complaint  Patient presents with  . Abnormal Lab    Thomas Garcia is a 79 y.o. male.  Pt presented with weakness and low hg from office  The history is provided by the patient. No language interpreter was used.  Weakness Severity:  Mild Onset quality:  Sudden Timing:  Constant Progression:  Waxing and waning Chronicity:  New Context: not alcohol use   Relieved by:  Nothing Worsened by:  Nothing Ineffective treatments:  None tried Associated symptoms: no abdominal pain, no chest pain, no cough, no diarrhea, no frequency, no headaches and no seizures        Past Medical History:  Diagnosis Date  . Diabetes mellitus without complication ([place])   . Kidney stones   . PONV (postoperative nausea and vomiting)     Patient Active Problem List   Diagnosis Date Noted  . Colon cancer screening 06/22/2019  . Abnormality of gait 11/06/2018  . Diabetes mellitus type 2 in nonobese (HCC)   . 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 Postoperative pain   . Unilateral complete BKA, left, sequela ([place])   . Acute blood loss anemia   . Essential hypertension   . Neuropathic pain   . Unilateral complete BKA, left, initial encounter ([place])   . Tobacco abuse   . Benign essential HTN   . Diabetic peripheral neuropathy ([place])   . Post-operative pain   . 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 PVD (peripheral vascular disease) ([place])   . 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 Osteomyelitis of second toe of left foot ([place]) 09/08/2018  . Cellulitis of left foot 09/08/2018  . Leukocytosis 09/08/2018  . Fever and chills 09/08/2018  . MOLE 10/26/2008  . 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 ([place]) 10/26/2008  . OVERWEIGHT 10/26/2008  . NICOTINE ADDICTION 10/26/2008  . ACUTE CYSTITIS 10/26/2008  . FATIGUE 10/26/2008  . COUGH 10/26/2008  .  ELECTROCARDIOGRAM, ABNORMAL 10/26/2008    Past Surgical History:  Procedure Laterality Date  . ABDOMINAL AORTOGRAM W/LOWER EXTREMITY N/A 09/17/2018   Procedure: ABDOMINAL AORTOGRAM W/LOWER EXTREMITY;  Surgeon: Marty Heck, MD;  Location: [place] [facility];  Service: Cardiovascular;  Laterality: N/A;  . AMPUTATION Left 09/22/2018   Procedure: AMPUTATION BELOW KNEE LEFT;  Surgeon: Marty Heck, MD;  Location: [place];  Service: Vascular;  Laterality: Left;  . AMPUTATION TOE Left 09/10/2018   Procedure: AMPUTATION OF THIRD TOE LEFT FOOT, DEBRIDEMENT OF ULCERS ON SECOND TOE AND PARTIAL AMPUTATION SECOND TOE ON LEFT FOOT;  Surgeon: Virl Cagey, MD;  [facility];  Service: General;  Laterality: Left;  . APPLICATION OF WOUND VAC Left 09/18/2018   Procedure: APPLICATION OF WOUND VAC;  Surgeon: Marty Heck, MD;  Location: [place];  Service: Vascular;  Laterality: Left;  . CYSTOSCOPY  05/08/2012   Procedure: CYSTOSCOPY;  Surgeon: Marissa Nestle, MD;  [facility];  Service: Urology;  Laterality: N/A;  Evacuation of clots  . KIDNEY SURGERY     sutures   . PERIPHERAL VASCULAR BALLOON ANGIOPLASTY  09/17/2018   Procedure: PERIPHERAL VASCULAR BALLOON ANGIOPLASTY;  Surgeon: Marty Heck, MD;  Location: [place] [facility];  Service: Cardiovascular;;  LT SFA and AT  . SPLENECTOMY    . TRANSMETATARSAL AMPUTATION Left 09/18/2018   Procedure: TRANSMETATARSAL AMPUTATION LEFT FOOT;  Surgeon: Marty Heck, MD;  Location: [place];  Service: Vascular;  Laterality: Left;  . TRANSURETHRAL RESECTION OF PROSTATE  05/14/2012   Procedure: TRANSURETHRAL RESECTION OF THE PROSTATE (TURP);  Surgeon: Marissa Nestle, MD;  [facility];  Service: Urology;  Laterality: N/A;       Family History  Problem Relation Age of Onset  . Diabetes Mother     Social History   Tobacco Use  . Smoking status: Current Every Day Smoker    Packs/day: 0.50    Years: 45.00    Pack  years: 22.50    Types: Cigarettes  . Smokeless tobacco: Never Used  Vaping Use  . Vaping Use: Never used  Substance Use Topics  . Alcohol use: No  . Drug use: No    Home Medications Prior to Admission medications   Medication Sig Start Date End Date Taking? Authorizing Provider  acetaminophen (TYLENOL) 325 MG tablet Take 2 tablets (650 mg total) by mouth every 4 (four) hours as needed for headache or mild pain. Patient taking differently: Take 650 mg by mouth daily.  09/29/18  Yes Angiulli, Lavon Paganini, PA-C  aspirin 81 MG EC tablet Take 1 tablet (81 mg total) by mouth daily. 11/06/18  Yes Waynetta Sandy, MD  atorvastatin (LIPITOR) 10 MG tablet Take 1 tablet (10 mg total) by mouth daily at 6 PM. 02/24/19  Yes Corum, Rex Kras, MD  Cal Carb-Mag Hydrox-Simeth (ROLAIDS ADVANCED) 1000-200-40 MG CHEW Chew 1 tablet by mouth daily as needed (for indigestion).   Yes [provider]  clopidogrel (PLAVIX) 75 MG tablet Take 1 tablet (75 mg total) by mouth daily. 02/24/19  Yes Corum, Rex Kras, MD  lisinopril (ZESTRIL) 5 MG tablet Take 1 tablet (5 mg total) by mouth daily. 02/24/19  Yes Corum, Rex Kras, MD  metFORMIN (GLUCOPHAGE) 500 MG tablet Take 500 mg by mouth 2 (two) times daily with a meal.   Yes [provider]  traZODone (DESYREL) 50 MG tablet Take at night Patient taking differently: Take 50 mg by mouth at bedtime.  11/30/19  Yes Gosrani, Nimish C, MD  nicotine (NICODERM CQ) 14 mg/24hr patch Place 1 patch (14 mg total) onto the skin daily. 11/30/19   Doree Albee, MD    Allergies    Patient has no known allergies.  Review of Systems   Review of Systems  Constitutional: Negative for appetite change and fatigue.  HENT: Negative for congestion, ear discharge and sinus pressure.   Eyes: Negative for discharge.  Respiratory: Negative for cough.   Cardiovascular: Negative for chest pain.  Gastrointestinal: Negative for abdominal pain and diarrhea.  Genitourinary:  Negative for frequency and hematuria.  Musculoskeletal: Negative for back pain.  Skin: Negative for rash.  Neurological: Positive for weakness. Negative for seizures and headaches.  Psychiatric/Behavioral: Negative for hallucinations.    Physical Exam Updated Vital Signs BP 135/61   Pulse 81   Temp 98.5 F (36.9 C) (Oral)   Resp (!) 21   Ht 5\' 9"  (1.753 m)   Wt 85.3 kg   SpO2 100%   BMI 27.76 kg/m   Physical Exam Vitals and nursing note reviewed.  Constitutional:      Appearance: He is well-developed.  HENT:     Head: Normocephalic.     Nose: Nose normal.  Eyes:     General: No scleral icterus.    Conjunctiva/sclera: Conjunctivae normal.  Neck:     Thyroid: No thyromegaly.  Cardiovascular:  Rate and Rhythm: Normal rate and regular rhythm.     Heart sounds: No murmur heard.  No friction rub. No gallop.   Pulmonary:     Breath sounds: No stridor. No wheezing or rales.  Chest:     Chest wall: No tenderness.  Abdominal:     General: There is no distension.     Tenderness: There is no abdominal tenderness. There is no rebound.  Genitourinary:    Comments: Rectal hem neg Musculoskeletal:     Cervical back: Neck supple.     Comments: aka  Lymphadenopathy:     Cervical: No cervical adenopathy.  Skin:    Findings: No erythema or rash.  Neurological:     Mental Status: He is oriented to person, place, and time.     Motor: No abnormal muscle tone.     Coordination: Coordination normal.  Psychiatric:        Behavior: Behavior normal.     ED Results / Procedures / Treatments   Labs (all labs ordered are listed, but only abnormal results are displayed) Labs Reviewed  CBC WITH DIFFERENTIAL/PLATELET - Abnormal; Notable for the following components:      Result Value   RBC 3.39 (*)    Hemoglobin 6.7 (*)    HCT 24.1 (*)    MCV 71.1 (*)    MCH 19.8 (*)    MCHC 27.8 (*)    RDW 25.2 (*)    Platelets 643 (*)    nRBC 0.5 (*)    All other components within  normal limits  BASIC METABOLIC PANEL - Abnormal; Notable for the following components:   Glucose, Bld 261 (*)    All other components within normal limits  VITAMIN B12 - Abnormal; Notable for the following components:   Vitamin B-12 148 (*)    All other components within normal limits  IRON AND TIBC - Abnormal; Notable for the following components:   Iron 10 (*)    Saturation Ratios 2 (*)    All other components within normal limits  FERRITIN - Abnormal; Notable for the following components:   Ferritin 11 (*)    All other components within normal limits  RETICULOCYTES - Abnormal; Notable for the following components:   RBC. 3.38 (*)    Immature Retic Fract 34.0 (*)    All other components within normal limits  FOLATE  OCCULT BLOOD X 1 CARD TO LAB, STOOL  POC OCCULT BLOOD, ED  TYPE AND SCREEN  PREPARE RBC (CROSSMATCH)  ABO/RH    EKG None  Radiology No results found.  Procedures Procedures (including critical care time)  Medications Ordered in ED Medications  0.9 %  sodium chloride infusion (has no administration in time range)    ED Course  I have reviewed the triage vital signs and the nursing notes.  Pertinent labs & imaging results that were available during my care of the patient were reviewed by me and considered in my medical decision making (see chart for details).    CRITICAL CARE Performed by: Milton Ferguson Total critical care time: 45 minutes Critical care time was exclusive of separately billable procedures and treating other patients. Critical care was necessary to treat or prevent imminent or life-threatening deterioration. Critical care was time spent personally by me on the following activities: development of treatment plan with patient and/or surrogate as well as nursing, discussions with consultants, evaluation of patient's response to treatment, examination of patient, obtaining history from patient or surrogate, ordering and performing treatments  and interventions, ordering and review of laboratory studies, ordering and review of radiographic studies, pulse oximetry and re-evaluation of patient's condition.  MDM Rules/Calculators/A&P                         Pt with anemia but stable.  Pt transfused 1 unit of prbc.       This patient presents to the ED for concern of weak this involves an extensive number of treatment options, and is a complaint that carries with it a high risk of complications and morbidity.  The differential diagnosis includes anemia   Lab Tests:   I Ordered, reviewed, and interpreted labs, which included *cbc which showed anemia  Medicines ordered:   I ordered medication prbc  Imaging Studies ordered:     Additional history obtained:   Additional history obtained from family md  Previous records obtained and reviewed.  Consultations Obtained:     Reevaluation:  After the interventions stated above, I reevaluated the patient and found improved  Critical Interventions:  .    Final Clinical Impression(s) / ED Diagnoses Final diagnoses:  Symptomatic anemia    Rx / DC Orders ED Discharge Orders    None       Milton Ferguson, MD 12/08/19 2044

## 2019-12-08 NOTE — Discharge Instructions (Addendum)
Follow up with your md the end of this week or next week.  Get seen sooner if problems

## 2019-12-08 NOTE — ED Notes (Signed)
CRITICAL VALUE ALERT  Critical Value:  Hemoglobin 6.7   Date & Time Notied:  12/08/19 & 1610  Provider Notified: EDP  Orders Received/Actions taken: Notified

## 2019-12-09 LAB — TYPE AND SCREEN
ABO/RH(D): A POS
Antibody Screen: NEGATIVE
Unit division: 0

## 2019-12-09 LAB — BPAM RBC
Blood Product Expiration Date: 202109162359
ISSUE DATE / TIME: 202108311645
Unit Type and Rh: 6200

## 2020-01-01 DIAGNOSIS — G546 Phantom limb syndrome with pain: Secondary | ICD-10-CM | POA: Diagnosis not present

## 2020-01-04 ENCOUNTER — Other Ambulatory Visit: Payer: Self-pay | Admitting: Family Medicine

## 2020-01-27 ENCOUNTER — Telehealth (INDEPENDENT_AMBULATORY_CARE_PROVIDER_SITE_OTHER): Payer: Self-pay | Admitting: Internal Medicine

## 2020-01-27 ENCOUNTER — Other Ambulatory Visit (INDEPENDENT_AMBULATORY_CARE_PROVIDER_SITE_OTHER): Payer: Self-pay | Admitting: Internal Medicine

## 2020-01-27 MED ORDER — CLOPIDOGREL BISULFATE 75 MG PO TABS
75.0000 mg | ORAL_TABLET | Freq: Every day | ORAL | 1 refills | Status: DC
Start: 2020-01-27 — End: 2020-04-14

## 2020-01-27 MED ORDER — LISINOPRIL 5 MG PO TABS
5.0000 mg | ORAL_TABLET | Freq: Every day | ORAL | 1 refills | Status: DC
Start: 2020-01-27 — End: 2020-04-14

## 2020-01-27 MED ORDER — ATORVASTATIN CALCIUM 10 MG PO TABS
10.0000 mg | ORAL_TABLET | Freq: Every day | ORAL | 1 refills | Status: DC
Start: 2020-01-27 — End: 2020-04-14

## 2020-02-02 NOTE — Telephone Encounter (Signed)
Done

## 2020-03-01 ENCOUNTER — Ambulatory Visit (INDEPENDENT_AMBULATORY_CARE_PROVIDER_SITE_OTHER): Payer: Medicare HMO | Admitting: Internal Medicine

## 2020-03-02 ENCOUNTER — Ambulatory Visit (INDEPENDENT_AMBULATORY_CARE_PROVIDER_SITE_OTHER): Payer: Medicare HMO | Admitting: Internal Medicine

## 2020-04-09 DIAGNOSIS — C3490 Malignant neoplasm of unspecified part of unspecified bronchus or lung: Secondary | ICD-10-CM

## 2020-04-09 HISTORY — DX: Malignant neoplasm of unspecified part of unspecified bronchus or lung: C34.90

## 2020-04-14 ENCOUNTER — Ambulatory Visit (INDEPENDENT_AMBULATORY_CARE_PROVIDER_SITE_OTHER): Payer: Medicare HMO | Admitting: Internal Medicine

## 2020-04-14 ENCOUNTER — Other Ambulatory Visit: Payer: Self-pay

## 2020-04-14 ENCOUNTER — Encounter (INDEPENDENT_AMBULATORY_CARE_PROVIDER_SITE_OTHER): Payer: Self-pay | Admitting: Internal Medicine

## 2020-04-14 VITALS — BP 120/84 | HR 87 | Temp 97.6°F | Resp 20 | Ht 69.0 in | Wt 187.0 lb

## 2020-04-14 DIAGNOSIS — I10 Essential (primary) hypertension: Secondary | ICD-10-CM | POA: Diagnosis not present

## 2020-04-14 DIAGNOSIS — E782 Mixed hyperlipidemia: Secondary | ICD-10-CM | POA: Diagnosis not present

## 2020-04-14 DIAGNOSIS — Z1159 Encounter for screening for other viral diseases: Secondary | ICD-10-CM

## 2020-04-14 DIAGNOSIS — D649 Anemia, unspecified: Secondary | ICD-10-CM

## 2020-04-14 DIAGNOSIS — E119 Type 2 diabetes mellitus without complications: Secondary | ICD-10-CM

## 2020-04-14 MED ORDER — TRAZODONE HCL 50 MG PO TABS
50.0000 mg | ORAL_TABLET | Freq: Every day | ORAL | 3 refills | Status: DC
Start: 2020-04-14 — End: 2022-10-05

## 2020-04-14 MED ORDER — LISINOPRIL 5 MG PO TABS
5.0000 mg | ORAL_TABLET | Freq: Every day | ORAL | 1 refills | Status: DC
Start: 2020-04-14 — End: 2020-10-03

## 2020-04-14 MED ORDER — CLOPIDOGREL BISULFATE 75 MG PO TABS
75.0000 mg | ORAL_TABLET | Freq: Every day | ORAL | 1 refills | Status: DC
Start: 2020-04-14 — End: 2020-07-13

## 2020-04-14 MED ORDER — ATORVASTATIN CALCIUM 10 MG PO TABS
10.0000 mg | ORAL_TABLET | Freq: Every day | ORAL | 1 refills | Status: DC
Start: 2020-04-14 — End: 2022-10-05

## 2020-04-14 MED ORDER — METFORMIN HCL 500 MG PO TABS
500.0000 mg | ORAL_TABLET | Freq: Two times a day (BID) | ORAL | 0 refills | Status: DC
Start: 2020-04-14 — End: 2020-10-03

## 2020-04-14 NOTE — Progress Notes (Signed)
Metrics: Intervention Frequency ACO  Documented Smoking Status Yearly  Screened one or more times in 24 months  Cessation Counseling or  Active cessation medication Past 24 months  Past 24 months   Guideline developer: UpToDate (See UpToDate for funding source) Date Released: 2014       Wellness Office Visit  Subjective:  Patient ID: Thomas Garcia, male    DOB: May 21, 1940  Age: 80 y.o. MRN: [id number]  CC: This man comes in for follow-up of diabetes, iron deficiency anemia, hypertension, dyslipidemia. HPI  The last time he was seen in August, he had a significantly low hemoglobin and I sent him to the emergency room upon where he had 1 unit of blood transfusion. Unfortunately, there was no follow-up after that. He takes metformin for his diabetes and his hemoglobin A1c last time was less than 7%. He takes lisinopril for hypertension. He is on statin therapy in the face of diabetes. He has no specific complaints today except that he needs refills on several medications. Unfortunately, he continues to smoke cigarettes. Past Medical History:  Diagnosis Date  . Diabetes mellitus without complication ([place])   . Kidney stones   . PONV (postoperative nausea and vomiting)    Past Surgical History:  Procedure Laterality Date  . ABDOMINAL AORTOGRAM W/LOWER EXTREMITY N/A 09/17/2018   Procedure: ABDOMINAL AORTOGRAM W/LOWER EXTREMITY;  Surgeon: Marty Heck, MD;  Location: [facility];  Service: Cardiovascular;  Laterality: N/A;  . AMPUTATION Left 09/22/2018   Procedure: AMPUTATION BELOW KNEE LEFT;  Surgeon: Marty Heck, MD;  Location: [facility];  Service: Vascular;  Laterality: Left;  . AMPUTATION TOE Left 09/10/2018   Procedure: AMPUTATION OF THIRD TOE LEFT FOOT, DEBRIDEMENT OF ULCERS ON SECOND TOE AND PARTIAL AMPUTATION SECOND TOE ON LEFT FOOT;  Surgeon: Virl Cagey, MD;  Location: [facility];  Service: General;  Laterality: Left;  . APPLICATION OF WOUND VAC Left  09/18/2018   Procedure: APPLICATION OF WOUND VAC;  Surgeon: Marty Heck, MD;  Location: [facility];  Service: Vascular;  Laterality: Left;  . CYSTOSCOPY  05/08/2012   Procedure: CYSTOSCOPY;  Surgeon: Marissa Nestle, MD;  Location: [facility];  Service: Urology;  Laterality: N/A;  Evacuation of clots  . KIDNEY SURGERY     sutures   . PERIPHERAL VASCULAR BALLOON ANGIOPLASTY  09/17/2018   Procedure: PERIPHERAL VASCULAR BALLOON ANGIOPLASTY;  Surgeon: Marty Heck, MD;  Location: [facility];  Service: Cardiovascular;;  LT SFA and AT  . SPLENECTOMY    . TRANSMETATARSAL AMPUTATION Left 09/18/2018   Procedure: TRANSMETATARSAL AMPUTATION LEFT FOOT;  Surgeon: Marty Heck, MD;  Location: [facility];  Service: Vascular;  Laterality: Left;  . TRANSURETHRAL RESECTION OF PROSTATE  05/14/2012   Procedure: TRANSURETHRAL RESECTION OF THE PROSTATE (TURP);  Surgeon: Marissa Nestle, MD;  [facility];  Service: Urology;  Laterality: N/A;     Family History  Problem Relation Age of Onset  . Diabetes Mother     Social History   Social History Narrative   Divorced since 2011.Lives with brother.Retired,previously maintenance work for [employer]   Social History   Tobacco Use  . Smoking status: Current Every Day Smoker    Packs/day: 0.50    Years: 45.00    Pack years: 22.50    Types: Cigarettes  . Smokeless tobacco: Never Used  Substance Use Topics  . Alcohol use: No    Current Meds  Medication Sig  . acetaminophen (TYLENOL) 325  MG tablet Take 2 tablets (650 mg total) by mouth every 4 (four) hours as needed for headache or mild pain. (Patient taking differently: Take 650 mg by mouth daily.)  . Cal Carb-Mag Hydrox-Simeth (ROLAIDS ADVANCED) 1000-200-40 MG CHEW Chew 1 tablet by mouth daily as needed (for indigestion).  . nicotine (NICODERM CQ) 14 mg/24hr patch Place 1 patch (14 mg total) onto the skin daily.  . [DISCONTINUED] aspirin 81 MG EC tablet Take 1 tablet (81 mg  total) by mouth daily.  . [DISCONTINUED] atorvastatin (LIPITOR) 10 MG tablet Take 1 tablet (10 mg total) by mouth daily at 6 PM.  . [DISCONTINUED] clopidogrel (PLAVIX) 75 MG tablet Take 1 tablet (75 mg total) by mouth daily.  . [DISCONTINUED] lisinopril (ZESTRIL) 5 MG tablet Take 1 tablet (5 mg total) by mouth daily.  . [DISCONTINUED] metFORMIN (GLUCOPHAGE) 500 MG tablet Take 500 mg by mouth 2 (two) times daily with a meal.  . [DISCONTINUED] traZODone (DESYREL) 50 MG tablet Take at night (Patient taking differently: Take 50 mg by mouth at bedtime.)      Depression screen [facility] 2/9 06/22/2019 11/24/2018  Decreased Interest 0 0  Down, Depressed, Hopeless 0 0  PHQ - 2 Score 0 0     Objective:   Today's Vitals: BP 120/84 (BP Location: Right Arm, Patient Position: Sitting, Cuff Size: Normal)   Pulse 87   Temp 97.6 F (36.4 C) (Temporal)   Resp 20   Ht 5\' 9"  (1.753 m)   Wt 187 lb (84.8 kg)   SpO2 97%   BMI 27.62 kg/m  Vitals with BMI 04/14/2020 12/08/2019 12/08/2019  Height 5\' 9"  - -  Weight 187 lbs - -  BMI 26.9 - -  Systolic [phone]  Diastolic 84 88 61  Pulse 87 82 81     Physical Exam    He looks frail and much older than his stated age. Blood pressure is reasonable today. He is alert and orientated but appears to be somewhat forgetful.   Assessment   1. Anemia, unspecified type   2. Diabetes mellitus type 2 in nonobese (HCC)   3. Essential hypertension   4. Mixed hyperlipidemia   5. Encounter for hepatitis C screening test for low risk patient       Tests ordered Orders Placed This Encounter  Procedures  . CBC  . COMPLETE METABOLIC PANEL WITH GFR  . Hemoglobin A1c  . Lipid panel  . Hepatitis C antibody     Plan: 1. We will repeat his CBC to see how his hemoglobin is doing and he may need referral to gastroenterology for further evaluation. 2. He will continue with metformin for diabetes and I will refill this today. I will check an A1c. 3. He will  continue on statin therapy and we will check a lipid panel. 4. He will continue with lisinopril for hypertension and we will check renal function. 5. Further recommendations will depend on all these results. He is going to be also given high-dose influenza vaccination today. 6. Follow-up with Judson Roch in about 3 months time.   Meds ordered this encounter  Medications  . metFORMIN (GLUCOPHAGE) 500 MG tablet    Sig: Take 1 tablet (500 mg total) by mouth 2 (two) times daily with a meal.    Dispense:  180 tablet    Refill:  0  . lisinopril (ZESTRIL) 5 MG tablet    Sig: Take 1 tablet (5 mg total) by mouth daily.    Dispense:  90 tablet    Refill:  1  . clopidogrel (PLAVIX) 75 MG tablet    Sig: Take 1 tablet (75 mg total) by mouth daily.    Dispense:  90 tablet    Refill:  1  . atorvastatin (LIPITOR) 10 MG tablet    Sig: Take 1 tablet (10 mg total) by mouth daily at 6 PM.    Dispense:  90 tablet    Refill:  1  . traZODone (DESYREL) 50 MG tablet    Sig: Take 1 tablet (50 mg total) by mouth at bedtime.    Dispense:  30 tablet    Refill:  3    [facility] [place] Luther Parody, MD

## 2020-04-15 ENCOUNTER — Other Ambulatory Visit (INDEPENDENT_AMBULATORY_CARE_PROVIDER_SITE_OTHER): Payer: Self-pay | Admitting: Internal Medicine

## 2020-04-15 DIAGNOSIS — D649 Anemia, unspecified: Secondary | ICD-10-CM

## 2020-04-15 LAB — CBC
HCT: 29.1 % — ABNORMAL LOW (ref 38.5–50.0)
Hemoglobin: 8.5 g/dL — ABNORMAL LOW (ref 13.2–17.1)
MCH: 23.7 pg — ABNORMAL LOW (ref 27.0–33.0)
MCHC: 29.2 g/dL — ABNORMAL LOW (ref 32.0–36.0)
MCV: 81.1 fL (ref 80.0–100.0)
MPV: 11.1 fL (ref 7.5–12.5)
Platelets: 574 10*3/uL — ABNORMAL HIGH (ref 140–400)
RBC: 3.59 10*6/uL — ABNORMAL LOW (ref 4.20–5.80)
RDW: 16.5 % — ABNORMAL HIGH (ref 11.0–15.0)
WBC: 7.9 10*3/uL (ref 3.8–10.8)

## 2020-04-15 LAB — COMPLETE METABOLIC PANEL WITH GFR
AG Ratio: 1.2 (calc) (ref 1.0–2.5)
ALT: 7 U/L — ABNORMAL LOW (ref 9–46)
AST: 9 U/L — ABNORMAL LOW (ref 10–35)
Albumin: 3.7 g/dL (ref 3.6–5.1)
Alkaline phosphatase (APISO): 83 U/L (ref 35–144)
BUN: 15 mg/dL (ref 7–25)
CO2: 28 mmol/L (ref 20–32)
Calcium: 9.3 mg/dL (ref 8.6–10.3)
Chloride: 107 mmol/L (ref 98–110)
Creat: 0.74 mg/dL (ref 0.70–1.18)
GFR, Est African American: 103 mL/min/{1.73_m2} (ref >=60)
GFR, Est Non African American: 89 mL/min/{1.73_m2} (ref >=60)
Globulin: 3.1 g/dL (calc) (ref 1.9–3.7)
Glucose, Bld: 179 mg/dL — ABNORMAL HIGH (ref 65–139)
Potassium: 4.6 mmol/L (ref 3.5–5.3)
Sodium: 141 mmol/L (ref 135–146)
Total Bilirubin: 0.3 mg/dL (ref 0.2–1.2)
Total Protein: 6.8 g/dL (ref 6.1–8.1)

## 2020-04-15 LAB — LIPID PANEL
Cholesterol: 109 mg/dL (ref <200)
HDL: 49 mg/dL (ref >=40)
LDL Cholesterol (Calc): 43 mg/dL (calc)
Non-HDL Cholesterol (Calc): 60 mg/dL (calc) (ref <130)
Total CHOL/HDL Ratio: 2.2 (calc) (ref <5.0)
Triglycerides: 85 mg/dL (ref <150)

## 2020-04-15 LAB — HEPATITIS C ANTIBODY
Hepatitis C Ab: NONREACTIVE
SIGNAL TO CUT-OFF: 0.02 (ref <1.00)

## 2020-04-15 LAB — HEMOGLOBIN A1C
Hgb A1c MFr Bld: 5.6 % of total Hgb (ref <5.7)
Mean Plasma Glucose: 114 mg/dL
eAG (mmol/L): 6.3 mmol/L

## 2020-04-15 NOTE — Progress Notes (Signed)
Please call this patient and let him know that his diabetes is completely improved.  However, he is still anemic which is iron deficient.  He needs to see a gastroenterologist for further evaluation.  I have put the order in for this so please make sure you refer him as soon as possible.  Thanks.

## 2020-04-18 NOTE — Progress Notes (Signed)
Return all to give lab results. Pt is not home currently. LVM to call office to get his results today.

## 2020-04-19 ENCOUNTER — Encounter: Payer: Self-pay | Admitting: Internal Medicine

## 2020-04-19 NOTE — Progress Notes (Signed)
Pt was called and given lab results. Pt said he thought the iron pill he has been taken was good enough. Pt was informed he will get a call from GI office to setup appointment soon.

## 2020-04-20 NOTE — Progress Notes (Signed)
Pt called and given lab results. Pt understood. Pt stated he has always had low iron problems. He would be on look out for phone call from referring provider to set a appointment.

## 2020-05-25 ENCOUNTER — Ambulatory Visit: Payer: Medicare HMO | Admitting: Nurse Practitioner

## 2020-05-25 NOTE — Progress Notes (Deleted)
Primary Care Physician:  Doree Albee, MD Primary Gastroenterologist:  Dr.   Rayne Du chief complaint on file.   HPI:   Thomas Garcia is a 80 y.o. male who presents on referral from primary care for anemia.  Reviewed information associated with referral including office visit dated 04/14/2020 which was a follow-up for diabetes, IDA, and others.  Significantly low hemoglobin noted in August and sent to the ER for 1 unit of PRBCs.  However, unfortunately has not had follow-up since then.  Recommended recheck CBC, hep C antibody, and other labs.  Hepatitis C antibody was negative.  CBC showed improved, but persistent hypochromic anemia with hemoglobin of 8.5.  Recommended referral to GI.  No history of colonoscopy or endoscopy in our system.  Today he states he is doing okay overall.  Past Medical History:  Diagnosis Date  . Diabetes mellitus without complication ([place])   . Kidney stones   . PONV (postoperative nausea and vomiting)     Past Surgical History:  Procedure Laterality Date  . ABDOMINAL AORTOGRAM W/LOWER EXTREMITY N/A 09/17/2018   Procedure: ABDOMINAL AORTOGRAM W/LOWER EXTREMITY;  Surgeon: Marty Heck, MD;  Location: [place] [facility];  Service: Cardiovascular;  Laterality: N/A;  . AMPUTATION Left 09/22/2018   Procedure: AMPUTATION BELOW KNEE LEFT;  Surgeon: Marty Heck, MD;  Location: [place];  Service: Vascular;  Laterality: Left;  . AMPUTATION TOE Left 09/10/2018   Procedure: AMPUTATION OF THIRD TOE LEFT FOOT, DEBRIDEMENT OF ULCERS ON SECOND TOE AND PARTIAL AMPUTATION SECOND TOE ON LEFT FOOT;  Surgeon: Virl Cagey, MD;  [facility];  Service: General;  Laterality: Left;  . APPLICATION OF WOUND VAC Left 09/18/2018   Procedure: APPLICATION OF WOUND VAC;  Surgeon: Marty Heck, MD;  Location: [facility];  Service: Vascular;  Laterality: Left;  . CYSTOSCOPY  05/08/2012   Procedure: CYSTOSCOPY;  Surgeon: Marissa Nestle, MD;  [facility];   Service: Urology;  Laterality: N/A;  Evacuation of clots  . KIDNEY SURGERY     sutures   . PERIPHERAL VASCULAR BALLOON ANGIOPLASTY  09/17/2018   Procedure: PERIPHERAL VASCULAR BALLOON ANGIOPLASTY;  Surgeon: Marty Heck, MD;  Location: [place] [facility];  Service: Cardiovascular;;  LT SFA and AT  . SPLENECTOMY    . TRANSMETATARSAL AMPUTATION Left 09/18/2018   Procedure: TRANSMETATARSAL AMPUTATION LEFT FOOT;  Surgeon: Marty Heck, MD;  Location: [facility];  Service: Vascular;  Laterality: Left;  . TRANSURETHRAL RESECTION OF PROSTATE  05/14/2012   Procedure: TRANSURETHRAL RESECTION OF THE PROSTATE (TURP);  Surgeon: Marissa Nestle, MD;  [facility];  Service: Urology;  Laterality: N/A;    Current Outpatient Medications  Medication Sig Dispense Refill  . acetaminophen (TYLENOL) 325 MG tablet Take 2 tablets (650 mg total) by mouth every 4 (four) hours as needed for headache or mild pain. (Patient taking differently: Take 650 mg by mouth daily.)    . atorvastatin (LIPITOR) 10 MG tablet Take 1 tablet (10 mg total) by mouth daily at 6 PM. 90 tablet 1  . Cal Carb-Mag Hydrox-Simeth (ROLAIDS ADVANCED) 1000-200-40 MG CHEW Chew 1 tablet by mouth daily as needed (for indigestion).    . clopidogrel (PLAVIX) 75 MG tablet Take 1 tablet (75 mg total) by mouth daily. 90 tablet 1  . lisinopril (ZESTRIL) 5 MG tablet Take 1 tablet (5 mg total) by mouth daily. 90 tablet 1  . metFORMIN (GLUCOPHAGE) 500 MG tablet Take 1 tablet (500 mg  total) by mouth 2 (two) times daily with a meal. 180 tablet 0  . nicotine (NICODERM CQ) 14 mg/24hr patch Place 1 patch (14 mg total) onto the skin daily. 28 patch 3  . traZODone (DESYREL) 50 MG tablet Take 1 tablet (50 mg total) by mouth at bedtime. 30 tablet 3   No current facility-administered medications for this visit.    Allergies as of 05/25/2020  . (No Known Allergies)    Family History  Problem Relation Age of Onset  . Diabetes Mother     Social  History   Socioeconomic History  . Marital status: Legally Separated    Spouse name: Not on file  . Number of children: Not on file  . Years of education: Not on file  . Highest education level: Not on file  Occupational History  . Not on file  Tobacco Use  . Smoking status: Current Every Day Smoker    Packs/day: 0.50    Years: 45.00    Pack years: 22.50    Types: Cigarettes  . Smokeless tobacco: Never Used  Vaping Use  . Vaping Use: Never used  Substance and Sexual Activity  . Alcohol use: No  . Drug use: No  . Sexual activity: Not on file  Other Topics Concern  . Not on file  Social History Narrative   Divorced since 2011.Lives with brother.Retired,previously maintenance work for [employer]   Social Determinants of Health   Financial Resource Strain: Not on file  Food Insecurity: Not on file  Transportation Needs: Not on file  Physical Activity: Not on file  Stress: Not on file  Social Connections: Not on file  Intimate Partner Violence: Not on file    Subjective:*** Review of Systems  Constitutional: Negative for chills, fever, malaise/fatigue and weight loss.  HENT: Negative for congestion and sore throat.   Respiratory: Negative for cough and shortness of breath.   Cardiovascular: Negative for chest pain and palpitations.  Gastrointestinal: Negative for abdominal pain, blood in stool, diarrhea, melena, nausea and vomiting.  Musculoskeletal: Negative for joint pain and myalgias.  Skin: Negative for rash.  Neurological: Negative for dizziness and weakness.  Endo/Heme/Allergies: Does not bruise/bleed easily.  Psychiatric/Behavioral: Negative for depression. The patient is not nervous/anxious.   All other systems reviewed and are negative.      Objective: There were no vitals taken for this visit. Physical Exam Vitals and nursing note reviewed.  Constitutional:      General: He is not in acute distress.    Appearance: Normal appearance. He is not  ill-appearing, toxic-appearing or diaphoretic.  HENT:     Head: Normocephalic and atraumatic.     Nose: No congestion or rhinorrhea.  Eyes:     General: No scleral icterus. Cardiovascular:     Rate and Rhythm: Normal rate and regular rhythm.     Heart sounds: Normal heart sounds.  Pulmonary:     Effort: Pulmonary effort is normal.     Breath sounds: Normal breath sounds.  Abdominal:     General: Bowel sounds are normal. There is no distension.     Palpations: Abdomen is soft. There is no hepatomegaly, splenomegaly or mass.     Tenderness: There is no abdominal tenderness. There is no guarding or rebound.     Hernia: No hernia is present.  Musculoskeletal:     Cervical back: Neck supple.  Skin:    General: Skin is warm and dry.     Coloration: Skin is not jaundiced.  Findings: No bruising or rash.  Neurological:     General: No focal deficit present.     Mental Status: He is alert and oriented to person, place, and time. Mental status is at baseline.  Psychiatric:        Mood and Affect: Mood normal.        Behavior: Behavior normal.        Thought Content: Thought content normal.      Assessment:  ***   Plan: ***    Thank you for allowing [name] to participate in the care of Marcelyn Ditty, DNP, AGNP-C Adult & Gerontological Nurse Practitioner [facility]   05/25/2020 8:48 AM   Disclaimer: This note was dictated with voice recognition software. Similar sounding words can inadvertently be transcribed and may not be corrected upon review.

## 2020-05-27 ENCOUNTER — Other Ambulatory Visit: Payer: Self-pay

## 2020-05-27 ENCOUNTER — Emergency Department (HOSPITAL_COMMUNITY): Payer: Medicare HMO

## 2020-05-27 ENCOUNTER — Encounter (HOSPITAL_COMMUNITY): Payer: Self-pay | Admitting: Emergency Medicine

## 2020-05-27 ENCOUNTER — Observation Stay (HOSPITAL_COMMUNITY)
Admission: EM | Admit: 2020-05-27 | Discharge: 2020-05-27 | Disposition: A | Discharge status: Home / Self Care | Payer: Medicare HMO | Attending: Emergency Medicine | Admitting: Emergency Medicine

## 2020-05-27 DIAGNOSIS — E119 Type 2 diabetes mellitus without complications: Secondary | ICD-10-CM | POA: Insufficient documentation

## 2020-05-27 DIAGNOSIS — Z79899 Other long term (current) drug therapy: Secondary | ICD-10-CM | POA: Diagnosis not present

## 2020-05-27 DIAGNOSIS — I2694 Multiple subsegmental pulmonary emboli without acute cor pulmonale: Secondary | ICD-10-CM | POA: Insufficient documentation

## 2020-05-27 DIAGNOSIS — J189 Pneumonia, unspecified organism: Secondary | ICD-10-CM

## 2020-05-27 DIAGNOSIS — Z20822 Contact with and (suspected) exposure to covid-19: Secondary | ICD-10-CM | POA: Insufficient documentation

## 2020-05-27 DIAGNOSIS — R0902 Hypoxemia: Secondary | ICD-10-CM | POA: Diagnosis not present

## 2020-05-27 DIAGNOSIS — R918 Other nonspecific abnormal finding of lung field: Secondary | ICD-10-CM

## 2020-05-27 DIAGNOSIS — R042 Hemoptysis: Secondary | ICD-10-CM | POA: Insufficient documentation

## 2020-05-27 DIAGNOSIS — R0602 Shortness of breath: Secondary | ICD-10-CM | POA: Diagnosis not present

## 2020-05-27 DIAGNOSIS — J181 Lobar pneumonia, unspecified organism: Principal | ICD-10-CM | POA: Insufficient documentation

## 2020-05-27 DIAGNOSIS — I10 Essential (primary) hypertension: Secondary | ICD-10-CM | POA: Diagnosis not present

## 2020-05-27 DIAGNOSIS — I700 Atherosclerosis of aorta: Secondary | ICD-10-CM | POA: Diagnosis not present

## 2020-05-27 DIAGNOSIS — Z7984 Long term (current) use of oral hypoglycemic drugs: Secondary | ICD-10-CM | POA: Insufficient documentation

## 2020-05-27 DIAGNOSIS — J984 Other disorders of lung: Secondary | ICD-10-CM | POA: Diagnosis present

## 2020-05-27 DIAGNOSIS — Z87891 Personal history of nicotine dependence: Secondary | ICD-10-CM | POA: Diagnosis not present

## 2020-05-27 DIAGNOSIS — I2699 Other pulmonary embolism without acute cor pulmonale: Secondary | ICD-10-CM | POA: Diagnosis not present

## 2020-05-27 LAB — CBC WITH DIFFERENTIAL/PLATELET
Abs Immature Granulocytes: 0.03 10*3/uL (ref 0.00–0.07)
Basophils Absolute: 0.1 10*3/uL (ref 0.0–0.1)
Basophils Relative: 1 %
Eosinophils Absolute: 0 10*3/uL (ref 0.0–0.5)
Eosinophils Relative: 0 %
HCT: 29 % — ABNORMAL LOW (ref 39.0–52.0)
Hemoglobin: 8.2 g/dL — ABNORMAL LOW (ref 13.0–17.0)
Immature Granulocytes: 0 %
Lymphocytes Relative: 20 %
Lymphs Abs: 2.3 10*3/uL (ref 0.7–4.0)
MCH: 24.4 pg — ABNORMAL LOW (ref 26.0–34.0)
MCHC: 28.3 g/dL — ABNORMAL LOW (ref 30.0–36.0)
MCV: 86.3 fL (ref 80.0–100.0)
Monocytes Absolute: 0.9 10*3/uL (ref 0.1–1.0)
Monocytes Relative: 8 %
Neutro Abs: 8 10*3/uL — ABNORMAL HIGH (ref 1.7–7.7)
Neutrophils Relative %: 71 %
Platelets: 632 10*3/uL — ABNORMAL HIGH (ref 150–400)
RBC: 3.36 MIL/uL — ABNORMAL LOW (ref 4.22–5.81)
RDW: 18.4 % — ABNORMAL HIGH (ref 11.5–15.5)
WBC: 11.3 10*3/uL — ABNORMAL HIGH (ref 4.0–10.5)
nRBC: 2.3 % — ABNORMAL HIGH (ref 0.0–0.2)

## 2020-05-27 LAB — TYPE AND SCREEN
ABO/RH(D): A POS
Antibody Screen: NEGATIVE

## 2020-05-27 LAB — COMPREHENSIVE METABOLIC PANEL
ALT: 9 U/L (ref 0–44)
AST: 14 U/L — ABNORMAL LOW (ref 15–41)
Albumin: 3.5 g/dL (ref 3.5–5.0)
Alkaline Phosphatase: 80 U/L (ref 38–126)
Anion gap: 8 (ref 5–15)
BUN: 23 mg/dL (ref 8–23)
CO2: 25 mmol/L (ref 22–32)
Calcium: 8.9 mg/dL (ref 8.9–10.3)
Chloride: 107 mmol/L (ref 98–111)
Creatinine, Ser: 0.91 mg/dL (ref 0.61–1.24)
GFR, Estimated: >60 mL/min (ref >60)
Glucose, Bld: 164 mg/dL — ABNORMAL HIGH (ref 70–99)
Potassium: 3.7 mmol/L (ref 3.5–5.1)
Sodium: 140 mmol/L (ref 135–145)
Total Bilirubin: 0.7 mg/dL (ref 0.3–1.2)
Total Protein: 7.6 g/dL (ref 6.5–8.1)

## 2020-05-27 LAB — GLUCOSE, CAPILLARY: Glucose-Capillary: 145 mg/dL — ABNORMAL HIGH (ref 70–99)

## 2020-05-27 IMAGING — DX DG CHEST 2V
2 series · 2 of 2 positions shown · non-contrast
Comparison: [DATE]

CLINICAL DATA: Shortness of breath and hemoptysis

EXAM:
CHEST - 2 VIEW

[chest ap]
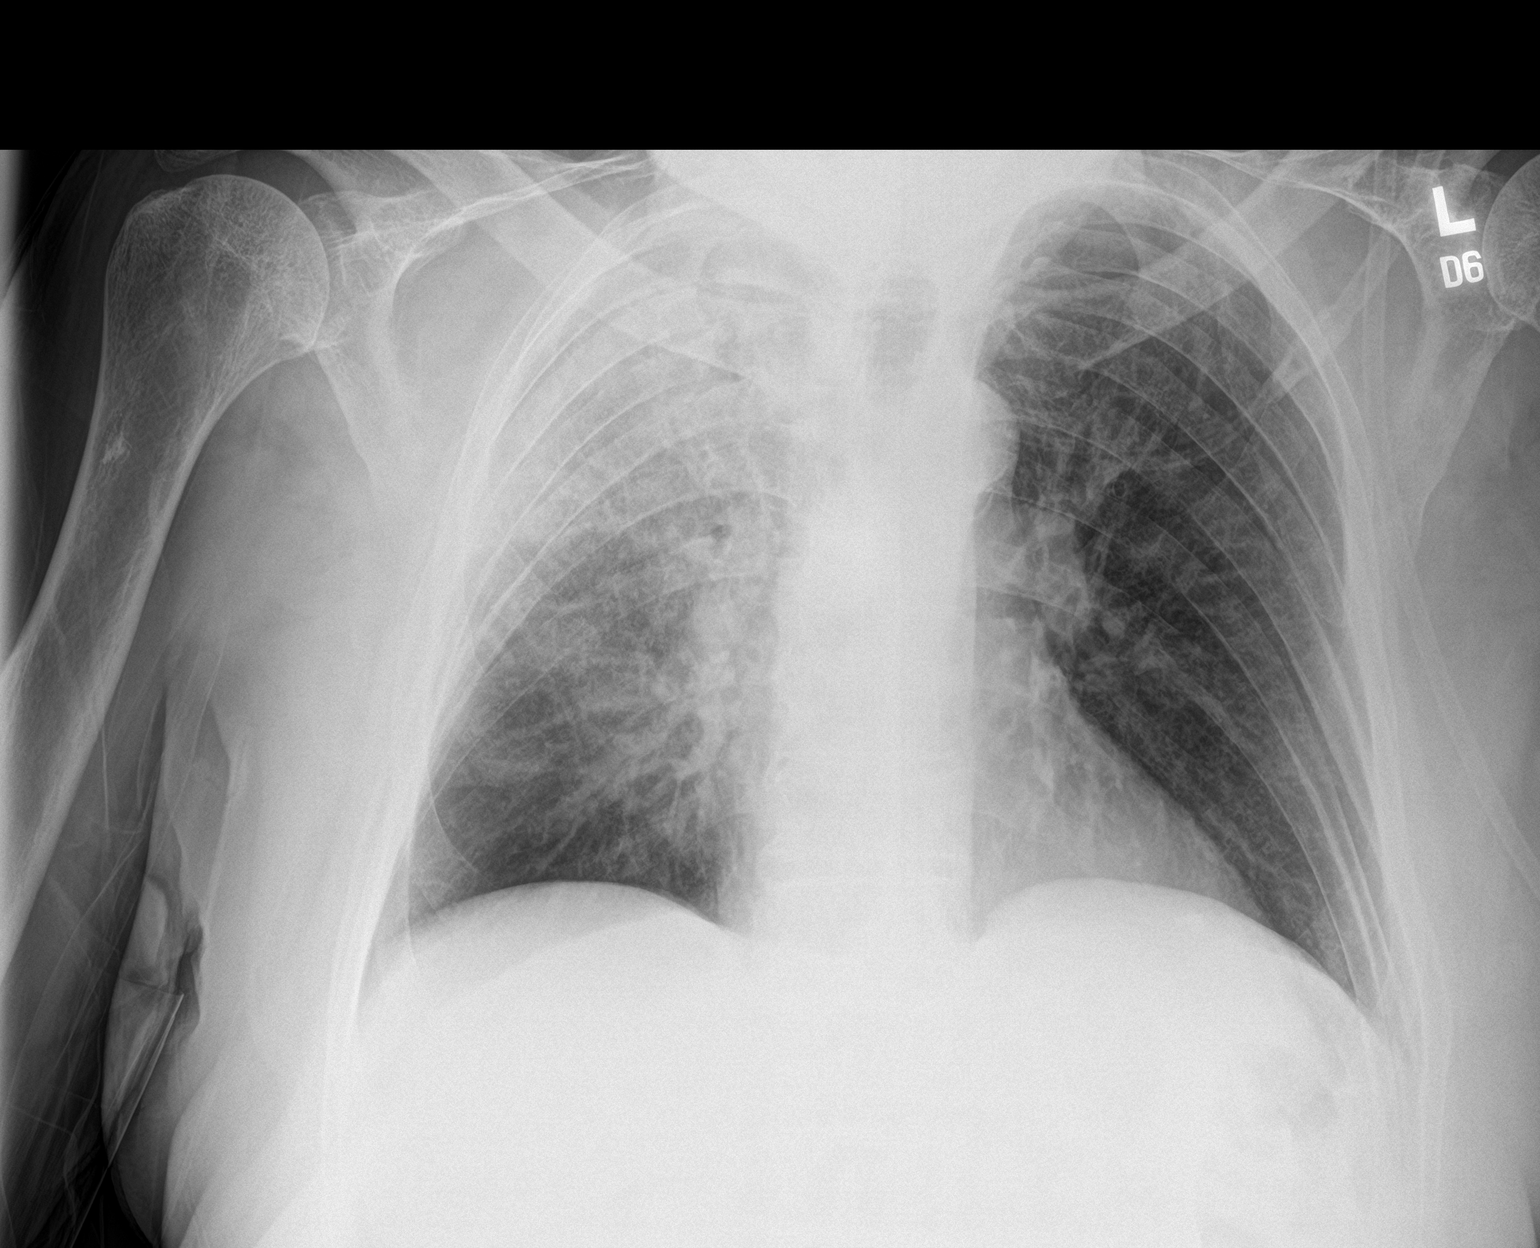

[chest lat]
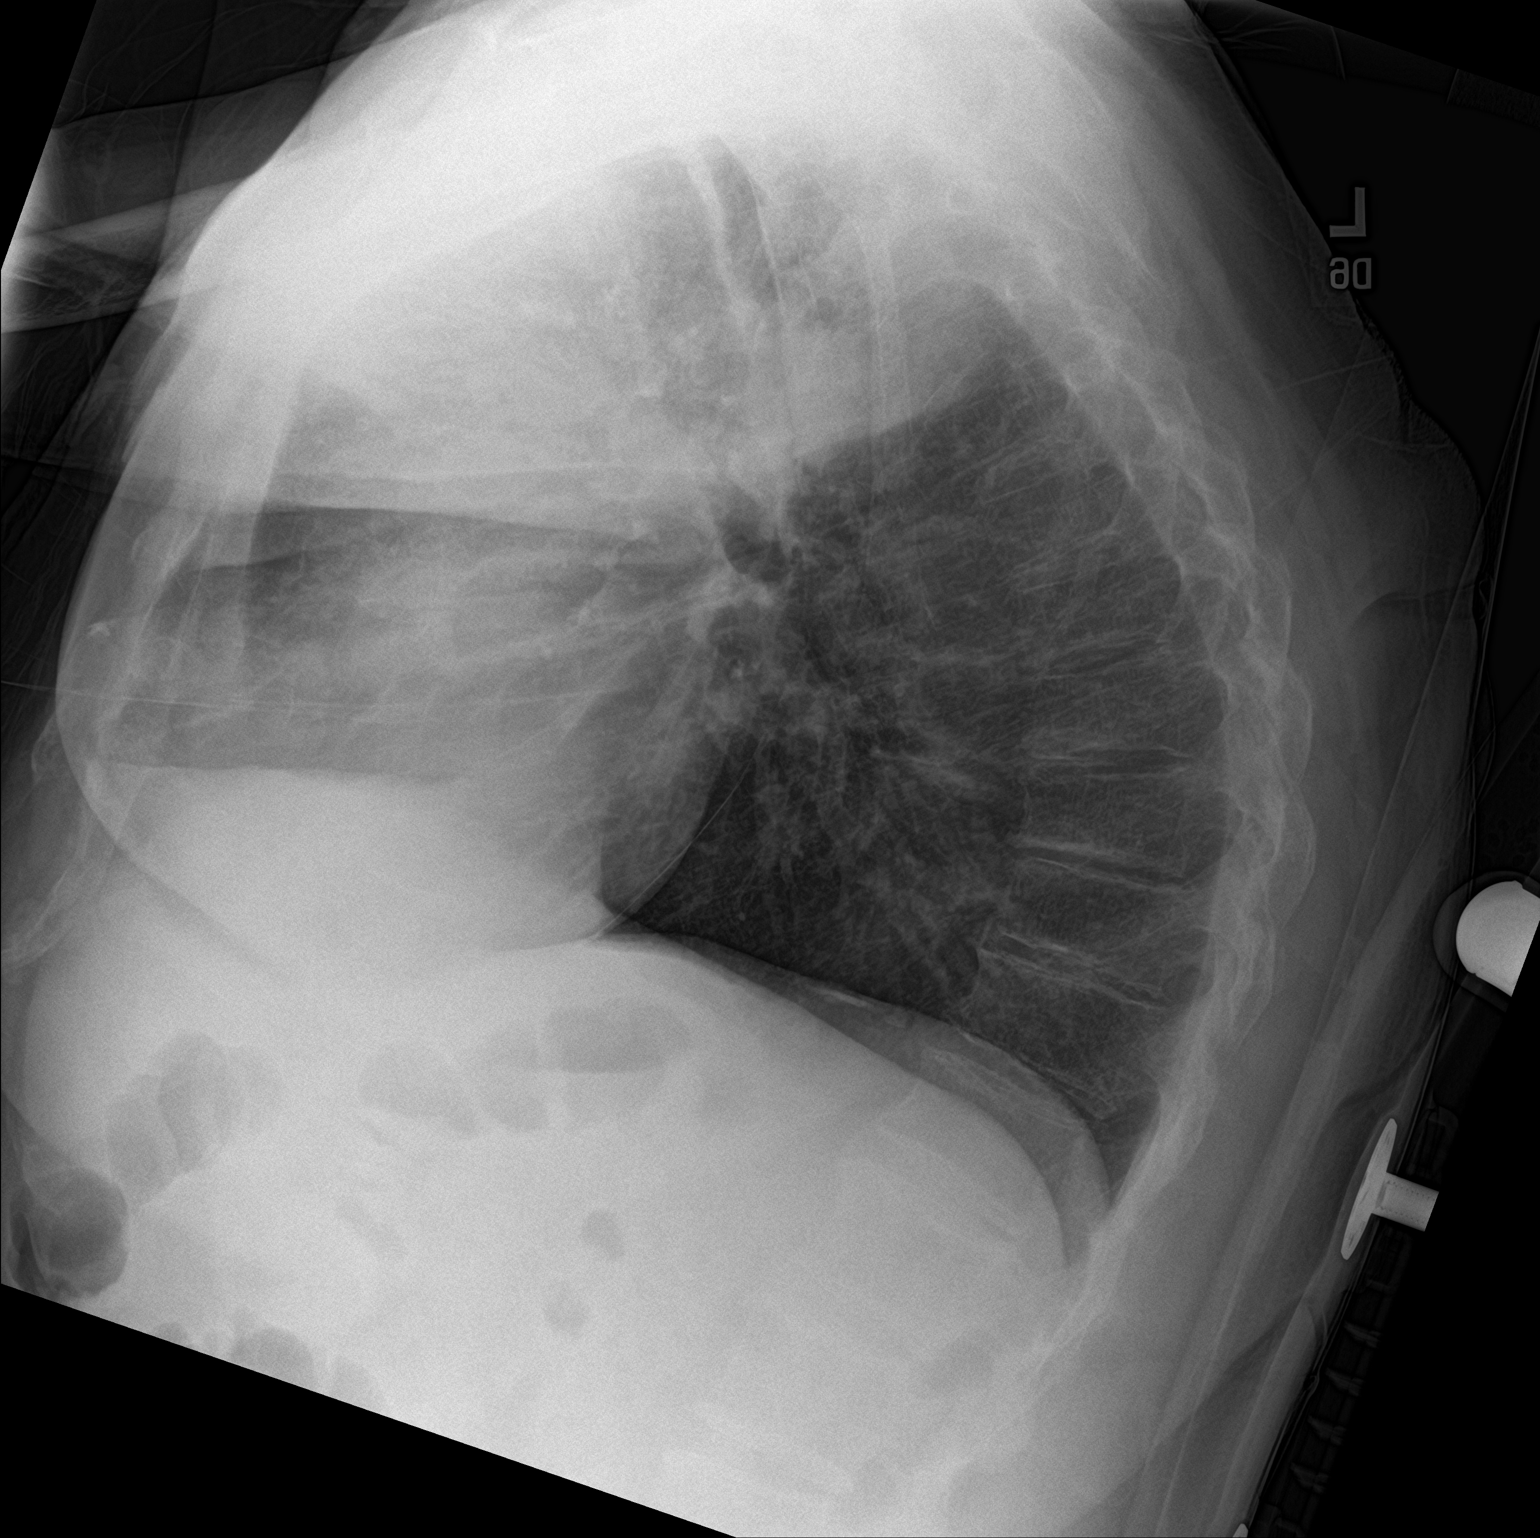

[2 of 2 positions shown; findings below may reference images not displayed]

FINDINGS: There is extensive airspace opacity throughout the right upper lobe.
Left lung clear. Heart size and pulmonary vascularity are normal. No
adenopathy. There is degenerative change in the thoracic spine.
IMPRESSION: Extensive airspace opacity throughout most of the right upper lobe
consistent with pneumonia. Lungs elsewhere clear. Cardiac silhouette
normal.

Followup PA and lateral chest radiographs recommended in 3-4 weeks
following trial of antibiotic therapy to ensure resolution and
exclude underlying malignancy.

## 2020-05-27 IMAGING — CT CT ANGIO CHEST
2 of 6 series · 17 of 46 positions shown · IV contrast (Omnipaque or Isovue)
Comparison: Chest radiograph dated [DATE].

CLINICAL DATA: 79-year-old male with concern for pulmonary
embolism.

EXAM:
CT ANGIOGRAPHY CHEST WITH CONTRAST
TECHNIQUE: Multidetector CT imaging of the chest was performed using the
standard protocol during bolus administration of intravenous
contrast. Multiplanar CT image reconstructions and MIPs were
obtained to evaluate the vascular anatomy.
CONTRAST:  100mL OMNIPAQUE IOHEXOL 350 MG/ML SOLN

[Series 5: pe axial thins · axial · 0.85mm/px · z∈[+779,+1046]mm · 14 of 293 slices shown]
[im 13/293  lung]
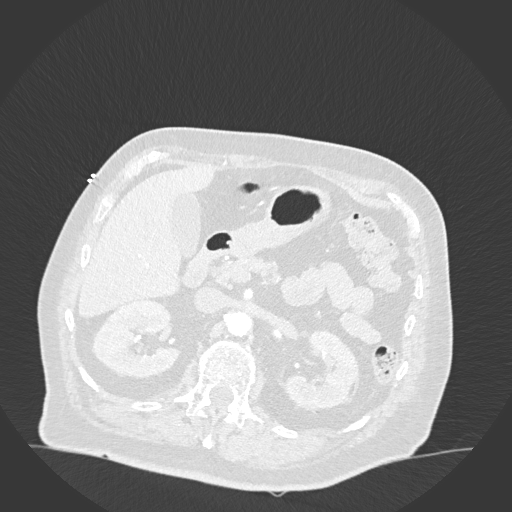
[im 39/293  soft-tissue]
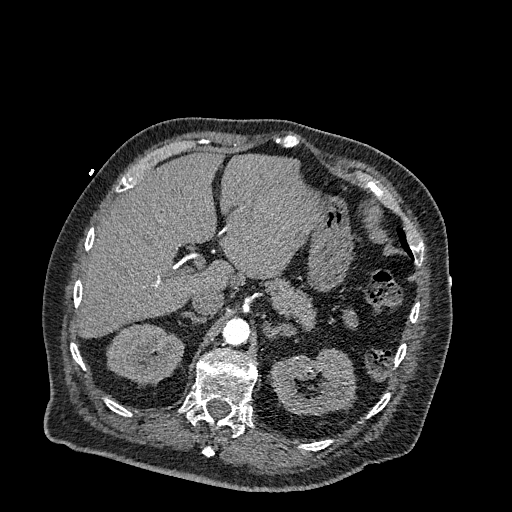
[im 51/293  lung]
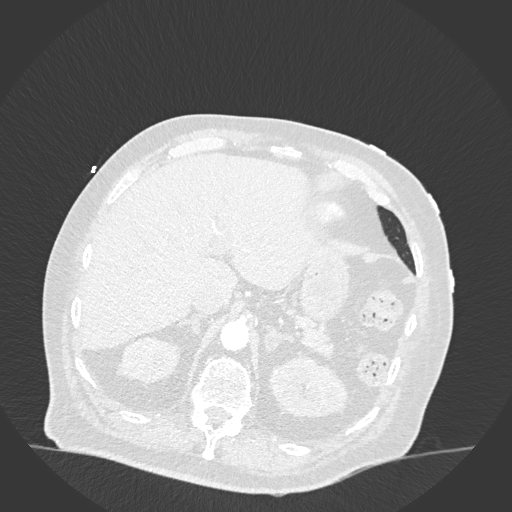
[im 77/293  soft-tissue]
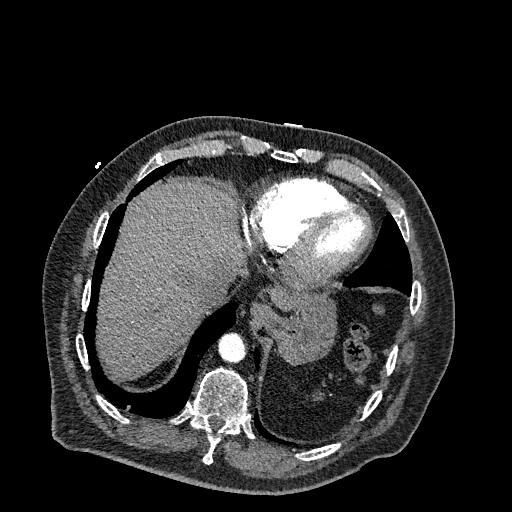
[im 102/293  lung]
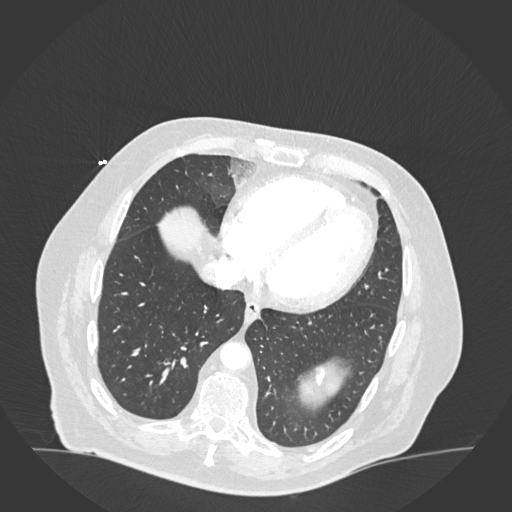
[im 115/293  soft-tissue]
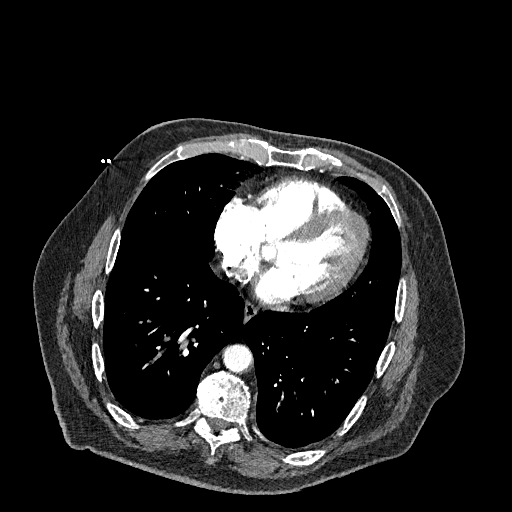
[im 140/293  lung]
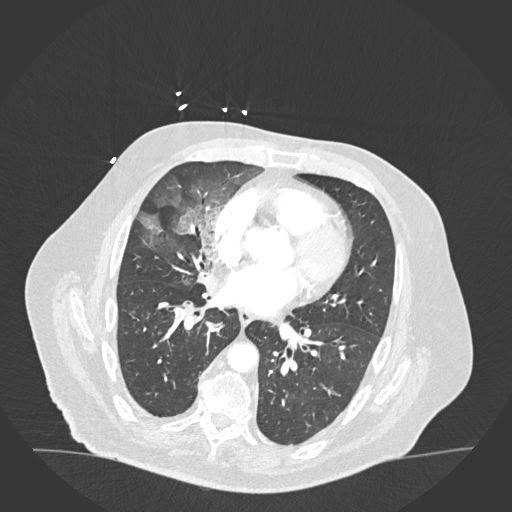
[im 153/293  soft-tissue]
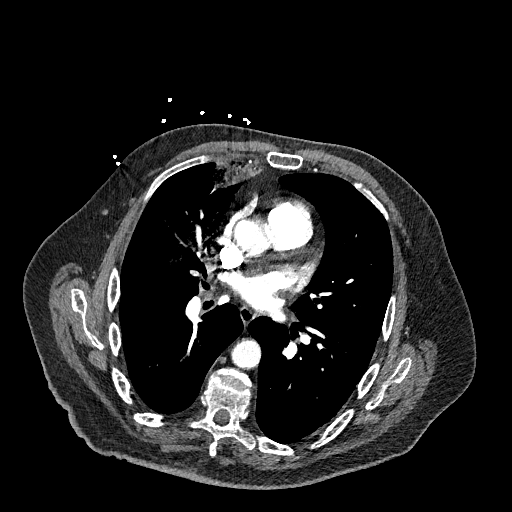
[im 178/293  lung]
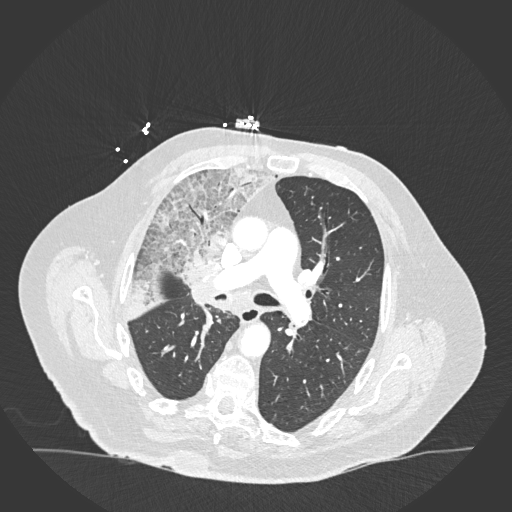
[im 191/293  soft-tissue]
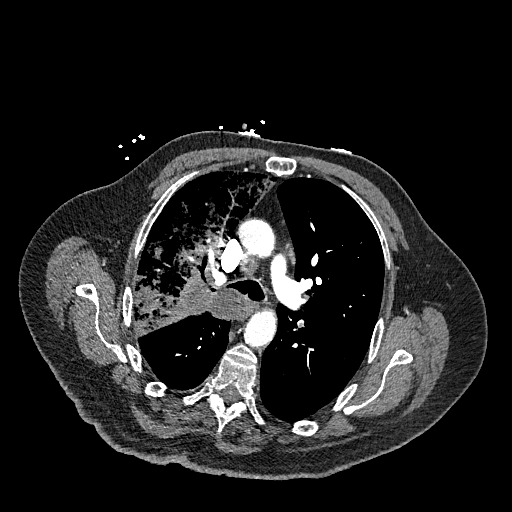
[im 216/293  lung]
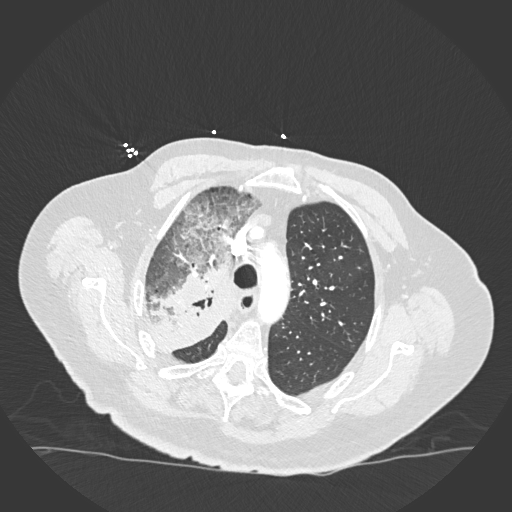
[im 242/293  soft-tissue]
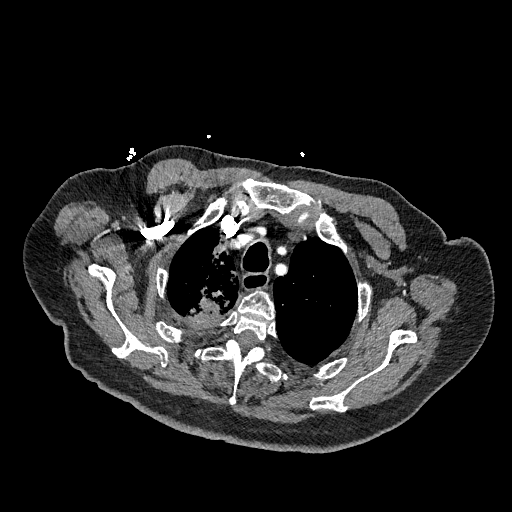
[im 254/293  lung]
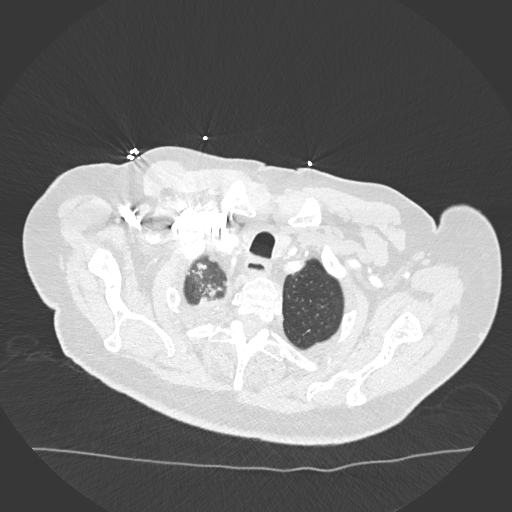
[im 280/293  soft-tissue]
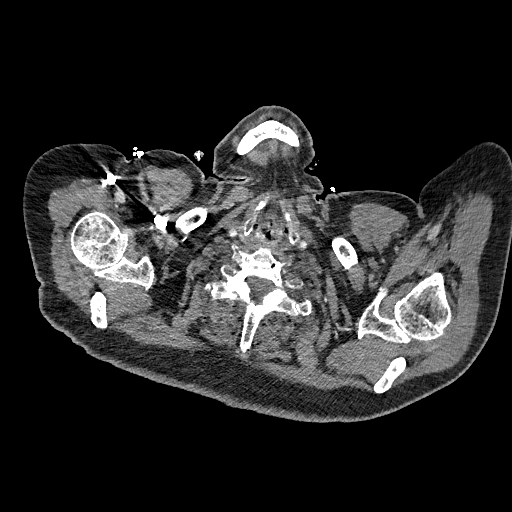

[Series 7: cor soft · coronal · 0.58mm/px · 3 of 172 slices shown]
[im 43/172  soft-tissue]
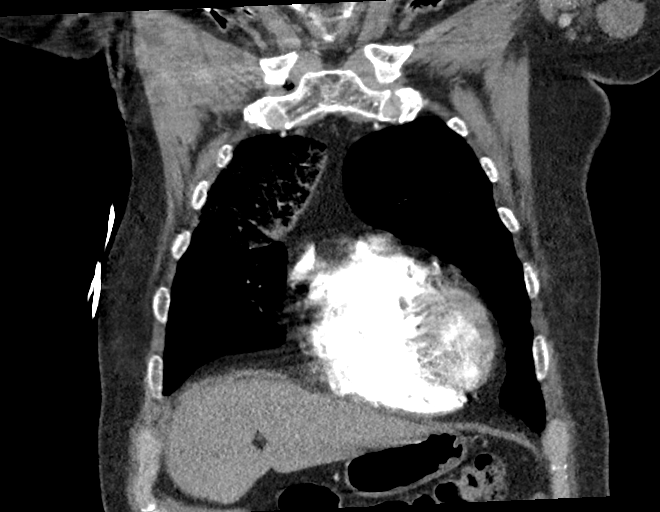
[im 86/172  soft-tissue]
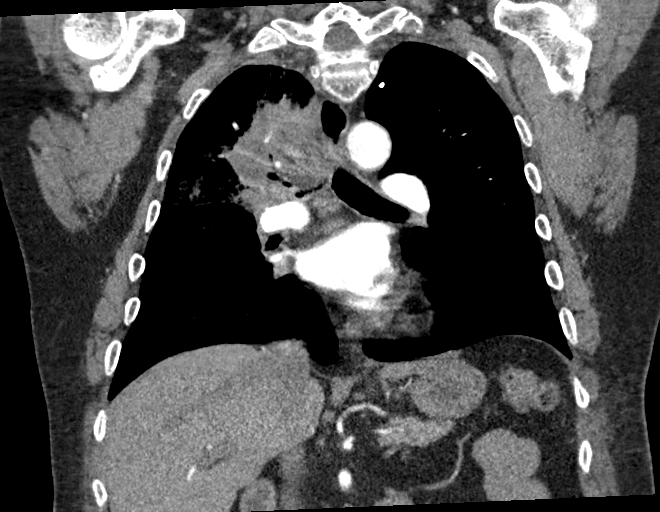
[im 129/172  soft-tissue]
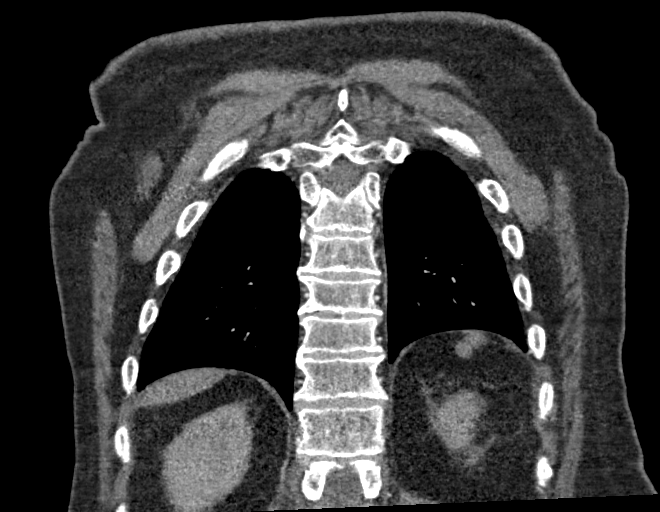

[17 of 46 positions shown; findings below may reference images not displayed]

FINDINGS: Cardiovascular: There is no cardiomegaly or pericardial effusion.
Three-vessel coronary vascular calcification. Mild atherosclerotic
calcification of the thoracic aorta. No aneurysmal dilatation or
dissection. Age indeterminate small linear nonocclusive pulmonary
artery emboli involving the segmental and subsegmental branches of
the lower lobes bilaterally as well as left upper lobe. Although
this may represent subacute or old PE/scarring, acute pulmonary
embolism is not excluded. Clinical correlation is recommended. No CT
evidence of right heart straining.

Mediastinum/Nodes: Mildly enlarged subcarinal lymph node measures 15
mm in short axis. The esophagus is grossly unremarkable. No
mediastinal fluid collection.

Lungs/Pleura: Large area of consolidative change involving the right
upper lobe. There is a 6.1 x 6.5 cm masslike consolidation with an
area of cavitation in the right upper lobe/right suprahilar region
extending into the right hilum and mediastinum. This may represent
an atypical infection such as fungal infection, developing abscess,
or TB. Malignancy is not excluded. Clinical correlation and close
follow-up recommended.

There is diffuse interstitial and interlobular septal prominence and
ground-glass density throughout the right upper lobe which may
represent combination of infection, and edema. There is a 4 mm right
lower lobe nodule (90/6) and a 4 mm subpleural nodule at the right
lung base posteriorly (108/6).

Small calcified granuloma in the left upper lobe. The left lung is
otherwise clear. There is no pleural effusion or pneumothorax. There
is mass effect and severe narrowing of the right upper lobe bronchus
as well as bronchus intermedius secondary to mediastinal
adenopathy/mass.

Upper Abdomen: A 1 cm left adrenal adenoma.

Musculoskeletal: Osteopenia with degenerative changes of the spine.
No acute osseous pathology. A 3 cm ovoid low attenuating lesion in
the subcutaneous soft tissues of the posterior chest wall,
incompletely characterized on this CT, but likely a sebaceous cyst.

Review of the MIP images confirms the above findings.
IMPRESSION: 1. Age indeterminate small bilateral pulmonary artery emboli. No CT
evidence of right heart straining.
2. Masslike consolidation of the right upper lobe/right suprahilar
region may represent an atypical infection such as fungal infection,
developing abscess, or TB versus malignancy. Clinical correlation is
recommended. Bronchoscopy may provide better evaluation.
3. Severe narrowing of the right upper lobe bronchus as well as
bronchus intermedius secondary to mediastinal adenopathy/mass.
4. A 1 cm left adrenal adenoma.
5. Aortic Atherosclerosis ([T6]-[T6]).

These results were called by telephone at the time of interpretation
on [DATE] at [DATE] to provider AZULES , who verbally
acknowledged these results.

## 2020-05-27 MED ORDER — IOHEXOL 350 MG/ML SOLN
100.0000 mL | Freq: Once | INTRAVENOUS | Status: AC | PRN
  Administered 2020-05-27: 100 mL via INTRAVENOUS

## 2020-05-27 MED ORDER — SODIUM CHLORIDE 0.9 % IV SOLN
1.0000 g | Freq: Once | INTRAVENOUS | Status: AC
  Administered 2020-05-27: 1 g via INTRAVENOUS
  Filled 2020-05-27: qty 10

## 2020-05-27 MED ORDER — ALBUTEROL SULFATE HFA 108 (90 BASE) MCG/ACT IN AERS
2.0000 | INHALATION_SPRAY | RESPIRATORY_TRACT | Status: DC | PRN
  Administered 2020-05-27: 2 via RESPIRATORY_TRACT
  Filled 2020-05-27: qty 6.7

## 2020-05-27 MED ORDER — SODIUM CHLORIDE 0.9 % IV SOLN
500.0000 mg | Freq: Once | INTRAVENOUS | Status: AC
  Administered 2020-05-27: 500 mg via INTRAVENOUS
  Filled 2020-05-27: qty 500

## 2020-05-27 NOTE — ED Notes (Signed)
Pt placed on 2 liters [place]

## 2020-05-27 NOTE — Progress Notes (Signed)
[facility] night shift hospitalist note.  The patient does not want to be admitted to the hospital. Dr. Rogene Houston had already told him the need to stay after he initially declined hospitalization, but he is very firm on his decision to go home AMA. Transfer center/bed control, nursing staff and Dr. Rogene Houston informed of his decision. Admission process canceled.  Tennis Must, MD

## 2020-05-27 NOTE — ED Notes (Signed)
Pt states he does not want to be admitted and would like to leave. Dr. Olevia Bowens made aware.

## 2020-05-27 NOTE — ED Triage Notes (Signed)
Pt c/o of sob and hemoptysis x 1 year

## 2020-05-27 NOTE — ED Provider Notes (Addendum)
[facility] EMERGENCY DEPARTMENT Provider Note   CSN: [id number] Arrival date & time: 05/27/20  1338     History Chief Complaint  Patient presents with  . Shortness of Breath    Thomas Garcia is a 80 y.o. male.  Patient with a complaint of hemoptysis for about 6 months.  And also shortness of breath.  Not normally on oxygen at home.  Oxygen saturations here go down to 85%.  He is coughing up some dark blood.  But not large amounts.  Patient does have a history of tobacco abuse.  No history of diabetes.        Past Medical History:  Diagnosis Date  . Diabetes mellitus without complication ([place])   . Kidney stones   . PONV (postoperative nausea and vomiting)     Patient Active Problem List   Diagnosis Date Noted  . Mass of upper lobe of right lung 05/27/2020  . Colon cancer screening 06/22/2019  . Abnormality of gait 11/06/2018  . Diabetes mellitus type 2 in nonobese (HCC)   . Phantom limb pain ([place])   . Postoperative pain   . Unilateral complete BKA, left, sequela ([place])   . Acute blood loss anemia   . Essential hypertension   . Neuropathic pain   . Unilateral complete BKA, left, initial encounter ([place])   . Tobacco abuse   . Benign essential HTN   . Diabetic peripheral neuropathy ([place])   . Post-operative pain   . 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 PVD (peripheral vascular disease) ([place])   . 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 ([place]) 09/08/2018  . Osteomyelitis of second toe of left foot ([place]) 09/08/2018  . Cellulitis of left foot 09/08/2018  . Leukocytosis 09/08/2018  . Fever and chills 09/08/2018  . MOLE 10/26/2008  . 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 OVERWEIGHT 10/26/2008  . NICOTINE ADDICTION 10/26/2008  . ACUTE CYSTITIS 10/26/2008  . FATIGUE 10/26/2008  . COUGH 10/26/2008  . ELECTROCARDIOGRAM, ABNORMAL 10/26/2008    Past Surgical History:  Procedure Laterality Date  . ABDOMINAL AORTOGRAM W/LOWER EXTREMITY N/A  09/17/2018   Procedure: ABDOMINAL AORTOGRAM W/LOWER EXTREMITY;  Surgeon: Marty Heck, MD;  Location: [place] [facility];  Service: Cardiovascular;  Laterality: N/A;  . AMPUTATION Left 09/22/2018   Procedure: AMPUTATION BELOW KNEE LEFT;  Surgeon: Marty Heck, MD;  Location: [place];  Service: Vascular;  Laterality: Left;  . AMPUTATION TOE Left 09/10/2018   Procedure: AMPUTATION OF THIRD TOE LEFT FOOT, DEBRIDEMENT OF ULCERS ON SECOND TOE AND PARTIAL AMPUTATION SECOND TOE ON LEFT FOOT;  Surgeon: Virl Cagey, MD;  Location: [facility];  Service: General;  Laterality: Left;  . APPLICATION OF WOUND VAC Left 09/18/2018   Procedure: APPLICATION OF WOUND VAC;  Surgeon: Marty Heck, MD;  Location: [place];  Service: Vascular;  Laterality: Left;  . CYSTOSCOPY  05/08/2012   Procedure: CYSTOSCOPY;  Surgeon: Marissa Nestle, MD;  Location: [facility];  Service: Urology;  Laterality: N/A;  Evacuation of clots  . KIDNEY SURGERY     sutures   . PERIPHERAL VASCULAR BALLOON ANGIOPLASTY  09/17/2018   Procedure: PERIPHERAL VASCULAR BALLOON ANGIOPLASTY;  Surgeon: Marty Heck, MD;  Location: [place] [facility];  Service: Cardiovascular;;  LT SFA and AT  . SPLENECTOMY    . TRANSMETATARSAL AMPUTATION Left 09/18/2018   Procedure: TRANSMETATARSAL AMPUTATION LEFT FOOT;  Surgeon: Marty Heck, MD;  Location: [place];  Service:  Vascular;  Laterality: Left;  . TRANSURETHRAL RESECTION OF PROSTATE  05/14/2012   Procedure: TRANSURETHRAL RESECTION OF THE PROSTATE (TURP);  Surgeon: Marissa Nestle, MD;  Location: [facility];  Service: Urology;  Laterality: N/A;       Family History  Problem Relation Age of Onset  . Diabetes Mother     Social History   Tobacco Use  . Smoking status: Former Smoker    Packs/day: 0.50    Years: 45.00    Pack years: 22.50    Types: Cigarettes    Quit date: 05/20/2020    Years since quitting: 0.0  . Smokeless tobacco: Never Used  Vaping Use  . Vaping Use:  Never used  Substance Use Topics  . Alcohol use: No  . Drug use: No    Home Medications Prior to Admission medications   Medication Sig Start Date End Date Taking? Authorizing Provider  acetaminophen (TYLENOL) 325 MG tablet Take 2 tablets (650 mg total) by mouth every 4 (four) hours as needed for headache or mild pain. Patient taking differently: Take 650 mg by mouth daily. 09/29/18   Angiulli, Lavon Paganini, PA-C  atorvastatin (LIPITOR) 10 MG tablet Take 1 tablet (10 mg total) by mouth daily at 6 PM. 04/14/20   Gosrani, Nimish C, MD  Cal Carb-Mag Hydrox-Simeth (ROLAIDS ADVANCED) 1000-200-40 MG CHEW Chew 1 tablet by mouth daily as needed (for indigestion).    [provider]  clopidogrel (PLAVIX) 75 MG tablet Take 1 tablet (75 mg total) by mouth daily. 04/14/20   Hurshel Party C, MD  lisinopril (ZESTRIL) 5 MG tablet Take 1 tablet (5 mg total) by mouth daily. 04/14/20   Doree Albee, MD  metFORMIN (GLUCOPHAGE) 500 MG tablet Take 1 tablet (500 mg total) by mouth 2 (two) times daily with a meal. 04/14/20   Gosrani, Nimish C, MD  nicotine (NICODERM CQ) 14 mg/24hr patch Place 1 patch (14 mg total) onto the skin daily. 11/30/19   Doree Albee, MD  traZODone (DESYREL) 50 MG tablet Take 1 tablet (50 mg total) by mouth at bedtime. 04/14/20   Doree Albee, MD    Allergies    Patient has no known allergies.  Review of Systems   Review of Systems  Constitutional: Negative for chills and fever.  HENT: Negative for rhinorrhea and sore throat.   Eyes: Negative for visual disturbance.  Respiratory: Positive for cough and shortness of breath.   Cardiovascular: Negative for chest pain and leg swelling.  Gastrointestinal: Negative for abdominal pain, diarrhea, nausea and vomiting.  Genitourinary: Negative for dysuria.  Musculoskeletal: Negative for back pain and neck pain.  Skin: Negative for rash.  Neurological: Negative for dizziness, light-headedness and headaches.  Hematological: Does  not bruise/bleed easily.  Psychiatric/Behavioral: Negative for confusion.    Physical Exam Updated Vital Signs BP (!) 101/49   Pulse 93   Temp 97.9 F (36.6 C) (Oral)   Resp (!) 21   Ht 1.753 m (5\' 9" )   Wt 81.6 kg   SpO2 91%   BMI 26.58 kg/m   Physical Exam Vitals and nursing note reviewed.  Constitutional:      General: He is not in acute distress.    Appearance: He is well-developed and well-nourished. He is ill-appearing.  HENT:     Head: Normocephalic and atraumatic.  Eyes:     Conjunctiva/sclera: Conjunctivae normal.     Pupils: Pupils are equal, round, and reactive to light.  Cardiovascular:     Rate and  Rhythm: Normal rate and regular rhythm.     Heart sounds: No murmur heard.   Pulmonary:     Effort: Respiratory distress present.     Breath sounds: Normal breath sounds.     Comments: Tachypneic Abdominal:     Palpations: Abdomen is soft.     Tenderness: There is no abdominal tenderness.  Musculoskeletal:        General: No edema. Normal range of motion.     Cervical back: Normal range of motion and neck supple.     Comments: Left below the knee amputation  Skin:    General: Skin is warm and dry.     Capillary Refill: Capillary refill takes less than 2 seconds.     Coloration: Skin is pale.  Neurological:     General: No focal deficit present.     Mental Status: He is alert and oriented to person, place, and time.     Cranial Nerves: No cranial nerve deficit.     Sensory: No sensory deficit.     Motor: No weakness.  Psychiatric:        Mood and Affect: Mood and affect normal.     ED Results / Procedures / Treatments   Labs (all labs ordered are listed, but only abnormal results are displayed) Labs Reviewed  GLUCOSE, CAPILLARY - Abnormal; Notable for the following components:      Result Value   Glucose-Capillary 145 (*)    All other components within normal limits  CBC WITH DIFFERENTIAL/PLATELET - Abnormal; Notable for the following  components:   WBC 11.3 (*)    RBC 3.36 (*)    Hemoglobin 8.2 (*)    HCT 29.0 (*)    MCH 24.4 (*)    MCHC 28.3 (*)    RDW 18.4 (*)    Platelets 632 (*)    nRBC 2.3 (*)    Neutro Abs 8.0 (*)    All other components within normal limits  COMPREHENSIVE METABOLIC PANEL - Abnormal; Notable for the following components:   Glucose, Bld 164 (*)    AST 14 (*)    All other components within normal limits  SARS CORONAVIRUS 2 (TAT 6-24 HRS)  TYPE AND SCREEN    EKG EKG Interpretation  Date/Time:  Friday May 27 2020 14:01:55 EST Ventricular Rate:  92 PR Interval:  152 QRS Duration: 142 QT Interval:  398 QTC Calculation: 492 R Axis:   -72 Text Interpretation: Normal sinus rhythm Right bundle branch block Left anterior fascicular block T wave abnormality, consider lateral ischemia Abnormal ECG New since previous tracing Confirmed by Fredia Sorrow [phone]) on 05/27/2020 4:02:51 PM   Radiology DG Chest 2 View  Result Date: 05/27/2020 CLINICAL DATA:  Shortness of breath and hemoptysis EXAM: CHEST - 2 VIEW COMPARISON:  September 08, 2018 FINDINGS: There is extensive airspace opacity throughout the right upper lobe. Left lung clear. Heart size and pulmonary vascularity are normal. No adenopathy. There is degenerative change in the thoracic spine. IMPRESSION: Extensive airspace opacity throughout most of the right upper lobe consistent with pneumonia. Lungs elsewhere clear. Cardiac silhouette normal. Followup PA and lateral chest radiographs recommended in 3-4 weeks following trial of antibiotic therapy to ensure resolution and exclude underlying malignancy. Electronically Signed   By: Lowella Grip III M.D.   On: 05/27/2020 14:30   CT Angio Chest PE W/Cm &/Or Wo Cm  Result Date: 05/27/2020 CLINICAL DATA:  80 year old male with concern for pulmonary embolism. EXAM: CT ANGIOGRAPHY CHEST WITH CONTRAST TECHNIQUE:  Multidetector CT imaging of the chest was performed using the standard protocol during  bolus administration of intravenous contrast. Multiplanar CT image reconstructions and MIPs were obtained to evaluate the vascular anatomy. CONTRAST:  127mL OMNIPAQUE IOHEXOL 350 MG/ML SOLN COMPARISON:  Chest radiograph dated 05/27/2020. FINDINGS: Cardiovascular: There is no cardiomegaly or pericardial effusion. Three-vessel coronary vascular calcification. Mild atherosclerotic calcification of the thoracic aorta. No aneurysmal dilatation or dissection. Age indeterminate small linear nonocclusive pulmonary artery emboli involving the segmental and subsegmental branches of the lower lobes bilaterally as well as left upper lobe. Although this may represent subacute or old PE/scarring, acute pulmonary embolism is not excluded. Clinical correlation is recommended. No CT evidence of right heart straining. Mediastinum/Nodes: Mildly enlarged subcarinal lymph node measures 15 mm in short axis. The esophagus is grossly unremarkable. No mediastinal fluid collection. Lungs/Pleura: Large area of consolidative change involving the right upper lobe. There is a 6.1 x 6.5 cm masslike consolidation with an area of cavitation in the right upper lobe/right suprahilar region extending into the right hilum and mediastinum. This may represent an atypical infection such as fungal infection, developing abscess, or TB. Malignancy is not excluded. Clinical correlation and close follow-up recommended. There is diffuse interstitial and interlobular septal prominence and ground-glass density throughout the right upper lobe which may represent combination of infection, and edema. There is a 4 mm right lower lobe nodule (90/6) and a 4 mm subpleural nodule at the right lung base posteriorly (108/6). Small calcified granuloma in the left upper lobe. The left lung is otherwise clear. There is no pleural effusion or pneumothorax. There is mass effect and severe narrowing of the right upper lobe bronchus as well as bronchus intermedius secondary to  mediastinal adenopathy/mass. Upper Abdomen: A 1 cm left adrenal adenoma. Musculoskeletal: Osteopenia with degenerative changes of the spine. No acute osseous pathology. A 3 cm ovoid low attenuating lesion in the subcutaneous soft tissues of the posterior chest wall, incompletely characterized on this CT, but likely a sebaceous cyst. Review of the MIP images confirms the above findings. IMPRESSION: 1. Age indeterminate small bilateral pulmonary artery emboli. No CT evidence of right heart straining. 2. Masslike consolidation of the right upper lobe/right suprahilar region may represent an atypical infection such as fungal infection, developing abscess, or TB versus malignancy. Clinical correlation is recommended. Bronchoscopy may provide better evaluation. 3. Severe narrowing of the right upper lobe bronchus as well as bronchus intermedius secondary to mediastinal adenopathy/mass. 4. A 1 cm left adrenal adenoma. 5. Aortic Atherosclerosis (ICD10-I70.0). These results were called by telephone at the time of interpretation on 05/27/2020 at 7:27 pm to provider Fredia Sorrow , who verbally acknowledged these results. Electronically Signed   By: Anner Crete M.D.   On: 05/27/2020 19:30    Procedures Procedures   CRITICAL CARE Performed by: Fredia Sorrow Total critical care time: 35 minutes Critical care time was exclusive of separately billable procedures and treating other patients. Critical care was necessary to treat or prevent imminent or life-threatening deterioration. Critical care was time spent personally by me on the following activities: development of treatment plan with patient and/or surrogate as well as nursing, discussions with consultants, evaluation of patient's response to treatment, examination of patient, obtaining history from patient or surrogate, ordering and performing treatments and interventions, ordering and review of laboratory studies, ordering and review of radiographic  studies, pulse oximetry and re-evaluation of patient's condition.   Medications Ordered in ED Medications  albuterol (VENTOLIN HFA) 108 (90 Base) MCG/ACT inhaler 2  puff (2 puffs Inhalation Given 05/27/20 1514)  cefTRIAXone (ROCEPHIN) 1 g in sodium chloride 0.9 % 100 mL IVPB (0 g Intravenous Stopped 05/27/20 1844)  azithromycin (ZITHROMAX) 500 mg in sodium chloride 0.9 % 250 mL IVPB (0 mg Intravenous Stopped 05/27/20 1812)  iohexol (OMNIPAQUE) 350 MG/ML injection 100 mL (100 mLs Intravenous Contrast Given 05/27/20 1847)    ED Course  I have reviewed the triage vital signs and the nursing notes.  Pertinent labs & imaging results that were available during my care of the patient were reviewed by me and considered in my medical decision making (see chart for details).    MDM Rules/Calculators/A&P                          Patient requiring oxygen here.  Not normally on oxygen.  On 2 L of oxygen L sat in the low 90s.  Respiratory rate still up in the 30 range.  Patient ill-appearing pale.  Hemoglobin coming in at 810 but patient does have history of chronic anemia from looking back through his records.  Chest x-ray showed right upper lobe pneumonia.  But based on his history and hemoptysis was a little concerned about perhaps neoplastic process.  And pulmonary embolus.  So CT angio chest was done which does show the opacity but there is some mediastinal involvement as well.  And some hilar area.  Also showing several PEs which could be old.  Difficult to tell whether they are acute.  Based on the pneumonia patient was treated with Rocephin and Zithromax.  Anticipating admission requirement just for the pneumonia with an oxygen requirement.  Discussed with hospitalist regarding that will probably need to go to [facility] for bronchoscopy.  And then there is also the dilemma of starting blood thinners in the face of hemoptysis.  Spoke with Dr. Olevia Bowens.  He will discuss it with critical care.  And will work out  the admission.  We will hold off on anticoagulating at this time.    Final Clinical Impression(s) / ED Diagnoses Final diagnoses:  Community acquired pneumonia of right upper lobe of lung  Hemoptysis  Hypoxia  Multiple subsegmental pulmonary emboli without acute cor pulmonale [facility])    Rx / DC Orders ED Discharge Orders    None       Fredia Sorrow, MD 05/27/20 2027    Fredia Sorrow, MD 05/27/20 2140

## 2020-05-28 LAB — SARS CORONAVIRUS 2 (TAT 6-24 HRS): SARS Coronavirus 2: NEGATIVE

## 2020-06-26 ENCOUNTER — Encounter (HOSPITAL_COMMUNITY): Payer: Self-pay

## 2020-06-26 ENCOUNTER — Emergency Department (HOSPITAL_COMMUNITY): Payer: Medicare HMO

## 2020-06-26 ENCOUNTER — Inpatient Hospital Stay (HOSPITAL_COMMUNITY)
Admission: EM | Admit: 2020-06-26 | Discharge: 2020-07-14 | DRG: 025 | Disposition: A | Discharge status: Home Health | Payer: Medicare HMO | Attending: Internal Medicine | Admitting: Internal Medicine

## 2020-06-26 ENCOUNTER — Other Ambulatory Visit: Payer: Self-pay

## 2020-06-26 DIAGNOSIS — C3490 Malignant neoplasm of unspecified part of unspecified bronchus or lung: Secondary | ICD-10-CM | POA: Diagnosis not present

## 2020-06-26 DIAGNOSIS — Z7984 Long term (current) use of oral hypoglycemic drugs: Secondary | ICD-10-CM

## 2020-06-26 DIAGNOSIS — K219 Gastro-esophageal reflux disease without esophagitis: Secondary | ICD-10-CM | POA: Diagnosis present

## 2020-06-26 DIAGNOSIS — C801 Malignant (primary) neoplasm, unspecified: Secondary | ICD-10-CM | POA: Diagnosis not present

## 2020-06-26 DIAGNOSIS — Z86711 Personal history of pulmonary embolism: Secondary | ICD-10-CM | POA: Diagnosis present

## 2020-06-26 DIAGNOSIS — E1152 Type 2 diabetes mellitus with diabetic peripheral angiopathy with gangrene: Secondary | ICD-10-CM | POA: Diagnosis not present

## 2020-06-26 DIAGNOSIS — Z79899 Other long term (current) drug therapy: Secondary | ICD-10-CM

## 2020-06-26 DIAGNOSIS — I452 Bifascicular block: Secondary | ICD-10-CM | POA: Diagnosis not present

## 2020-06-26 DIAGNOSIS — I2782 Chronic pulmonary embolism: Secondary | ICD-10-CM | POA: Diagnosis not present

## 2020-06-26 DIAGNOSIS — N281 Cyst of kidney, acquired: Secondary | ICD-10-CM | POA: Diagnosis not present

## 2020-06-26 DIAGNOSIS — I618 Other nontraumatic intracerebral hemorrhage: Secondary | ICD-10-CM | POA: Diagnosis not present

## 2020-06-26 DIAGNOSIS — Z8669 Personal history of other diseases of the nervous system and sense organs: Secondary | ICD-10-CM | POA: Diagnosis not present

## 2020-06-26 DIAGNOSIS — J984 Other disorders of lung: Secondary | ICD-10-CM

## 2020-06-26 DIAGNOSIS — J9859 Other diseases of mediastinum, not elsewhere classified: Secondary | ICD-10-CM | POA: Diagnosis not present

## 2020-06-26 DIAGNOSIS — N3289 Other specified disorders of bladder: Secondary | ICD-10-CM | POA: Diagnosis not present

## 2020-06-26 DIAGNOSIS — R220 Localized swelling, mass and lump, head: Secondary | ICD-10-CM | POA: Diagnosis not present

## 2020-06-26 DIAGNOSIS — E785 Hyperlipidemia, unspecified: Secondary | ICD-10-CM | POA: Diagnosis present

## 2020-06-26 DIAGNOSIS — C7931 Secondary malignant neoplasm of brain: Secondary | ICD-10-CM | POA: Diagnosis not present

## 2020-06-26 DIAGNOSIS — E1165 Type 2 diabetes mellitus with hyperglycemia: Secondary | ICD-10-CM | POA: Diagnosis not present

## 2020-06-26 DIAGNOSIS — Z66 Do not resuscitate: Secondary | ICD-10-CM | POA: Diagnosis not present

## 2020-06-26 DIAGNOSIS — Z9081 Acquired absence of spleen: Secondary | ICD-10-CM

## 2020-06-26 DIAGNOSIS — I10 Essential (primary) hypertension: Secondary | ICD-10-CM | POA: Diagnosis not present

## 2020-06-26 DIAGNOSIS — I2699 Other pulmonary embolism without acute cor pulmonale: Secondary | ICD-10-CM | POA: Diagnosis present

## 2020-06-26 DIAGNOSIS — D500 Iron deficiency anemia secondary to blood loss (chronic): Secondary | ICD-10-CM | POA: Diagnosis not present

## 2020-06-26 DIAGNOSIS — C719 Malignant neoplasm of brain, unspecified: Secondary | ICD-10-CM

## 2020-06-26 DIAGNOSIS — D62 Acute posthemorrhagic anemia: Secondary | ICD-10-CM | POA: Diagnosis present

## 2020-06-26 DIAGNOSIS — T380X5A Adverse effect of glucocorticoids and synthetic analogues, initial encounter: Secondary | ICD-10-CM | POA: Diagnosis not present

## 2020-06-26 DIAGNOSIS — G9389 Other specified disorders of brain: Secondary | ICD-10-CM | POA: Diagnosis not present

## 2020-06-26 DIAGNOSIS — G939 Disorder of brain, unspecified: Secondary | ICD-10-CM

## 2020-06-26 DIAGNOSIS — I619 Nontraumatic intracerebral hemorrhage, unspecified: Secondary | ICD-10-CM

## 2020-06-26 DIAGNOSIS — J9811 Atelectasis: Secondary | ICD-10-CM | POA: Diagnosis not present

## 2020-06-26 DIAGNOSIS — F17210 Nicotine dependence, cigarettes, uncomplicated: Secondary | ICD-10-CM | POA: Diagnosis present

## 2020-06-26 DIAGNOSIS — I288 Other diseases of pulmonary vessels: Secondary | ICD-10-CM | POA: Diagnosis not present

## 2020-06-26 DIAGNOSIS — E861 Hypovolemia: Secondary | ICD-10-CM | POA: Diagnosis present

## 2020-06-26 DIAGNOSIS — D496 Neoplasm of unspecified behavior of brain: Secondary | ICD-10-CM | POA: Diagnosis not present

## 2020-06-26 DIAGNOSIS — I615 Nontraumatic intracerebral hemorrhage, intraventricular: Secondary | ICD-10-CM | POA: Diagnosis present

## 2020-06-26 DIAGNOSIS — N400 Enlarged prostate without lower urinary tract symptoms: Secondary | ICD-10-CM | POA: Diagnosis not present

## 2020-06-26 DIAGNOSIS — Z20822 Contact with and (suspected) exposure to covid-19: Secondary | ICD-10-CM | POA: Diagnosis not present

## 2020-06-26 DIAGNOSIS — E11649 Type 2 diabetes mellitus with hypoglycemia without coma: Secondary | ICD-10-CM | POA: Diagnosis not present

## 2020-06-26 DIAGNOSIS — D509 Iron deficiency anemia, unspecified: Secondary | ICD-10-CM | POA: Diagnosis not present

## 2020-06-26 DIAGNOSIS — C3411 Malignant neoplasm of upper lobe, right bronchus or lung: Secondary | ICD-10-CM | POA: Diagnosis present

## 2020-06-26 DIAGNOSIS — C781 Secondary malignant neoplasm of mediastinum: Secondary | ICD-10-CM | POA: Diagnosis present

## 2020-06-26 DIAGNOSIS — E1151 Type 2 diabetes mellitus with diabetic peripheral angiopathy without gangrene: Secondary | ICD-10-CM | POA: Diagnosis not present

## 2020-06-26 DIAGNOSIS — Z993 Dependence on wheelchair: Secondary | ICD-10-CM | POA: Diagnosis not present

## 2020-06-26 DIAGNOSIS — R918 Other nonspecific abnormal finding of lung field: Secondary | ICD-10-CM | POA: Diagnosis not present

## 2020-06-26 DIAGNOSIS — E119 Type 2 diabetes mellitus without complications: Secondary | ICD-10-CM

## 2020-06-26 DIAGNOSIS — D649 Anemia, unspecified: Secondary | ICD-10-CM | POA: Diagnosis not present

## 2020-06-26 DIAGNOSIS — Z833 Family history of diabetes mellitus: Secondary | ICD-10-CM

## 2020-06-26 DIAGNOSIS — G319 Degenerative disease of nervous system, unspecified: Secondary | ICD-10-CM | POA: Diagnosis not present

## 2020-06-26 DIAGNOSIS — R531 Weakness: Secondary | ICD-10-CM | POA: Diagnosis not present

## 2020-06-26 DIAGNOSIS — Z7902 Long term (current) use of antithrombotics/antiplatelets: Secondary | ICD-10-CM

## 2020-06-26 DIAGNOSIS — R059 Cough, unspecified: Secondary | ICD-10-CM | POA: Diagnosis not present

## 2020-06-26 DIAGNOSIS — Z8679 Personal history of other diseases of the circulatory system: Secondary | ICD-10-CM | POA: Diagnosis not present

## 2020-06-26 DIAGNOSIS — J189 Pneumonia, unspecified organism: Secondary | ICD-10-CM | POA: Diagnosis not present

## 2020-06-26 DIAGNOSIS — H5347 Heteronymous bilateral field defects: Secondary | ICD-10-CM | POA: Diagnosis present

## 2020-06-26 DIAGNOSIS — I739 Peripheral vascular disease, unspecified: Secondary | ICD-10-CM | POA: Diagnosis present

## 2020-06-26 DIAGNOSIS — G936 Cerebral edema: Secondary | ICD-10-CM | POA: Diagnosis present

## 2020-06-26 DIAGNOSIS — R509 Fever, unspecified: Secondary | ICD-10-CM | POA: Diagnosis not present

## 2020-06-26 DIAGNOSIS — R042 Hemoptysis: Secondary | ICD-10-CM | POA: Diagnosis not present

## 2020-06-26 DIAGNOSIS — Z89512 Acquired absence of left leg below knee: Secondary | ICD-10-CM

## 2020-06-26 DIAGNOSIS — D72829 Elevated white blood cell count, unspecified: Secondary | ICD-10-CM | POA: Diagnosis not present

## 2020-06-26 DIAGNOSIS — E871 Hypo-osmolality and hyponatremia: Secondary | ICD-10-CM | POA: Diagnosis not present

## 2020-06-26 DIAGNOSIS — R5383 Other fatigue: Secondary | ICD-10-CM | POA: Diagnosis present

## 2020-06-26 DIAGNOSIS — R29818 Other symptoms and signs involving the nervous system: Secondary | ICD-10-CM | POA: Diagnosis not present

## 2020-06-26 LAB — CBC WITH DIFFERENTIAL/PLATELET
Abs Immature Granulocytes: 0.06 10*3/uL (ref 0.00–0.07)
Basophils Absolute: 0.1 10*3/uL (ref 0.0–0.1)
Basophils Relative: 1 %
Eosinophils Absolute: 0 10*3/uL (ref 0.0–0.5)
Eosinophils Relative: 0 %
HCT: 27.7 % — ABNORMAL LOW (ref 39.0–52.0)
Hemoglobin: 7.8 g/dL — ABNORMAL LOW (ref 13.0–17.0)
Immature Granulocytes: 1 %
Lymphocytes Relative: 10 %
Lymphs Abs: 1.3 10*3/uL (ref 0.7–4.0)
MCH: 21.5 pg — ABNORMAL LOW (ref 26.0–34.0)
MCHC: 28.2 g/dL — ABNORMAL LOW (ref 30.0–36.0)
MCV: 76.3 fL — ABNORMAL LOW (ref 80.0–100.0)
Monocytes Absolute: 1 10*3/uL (ref 0.1–1.0)
Monocytes Relative: 8 %
Neutro Abs: 10.3 10*3/uL — ABNORMAL HIGH (ref 1.7–7.7)
Neutrophils Relative %: 80 %
Platelets: 745 10*3/uL — ABNORMAL HIGH (ref 150–400)
RBC: 3.63 MIL/uL — ABNORMAL LOW (ref 4.22–5.81)
RDW: 18.6 % — ABNORMAL HIGH (ref 11.5–15.5)
WBC: 12.8 10*3/uL — ABNORMAL HIGH (ref 4.0–10.5)
nRBC: 0.2 % (ref 0.0–0.2)

## 2020-06-26 LAB — COMPREHENSIVE METABOLIC PANEL
ALT: 11 U/L (ref 0–44)
AST: 12 U/L — ABNORMAL LOW (ref 15–41)
Albumin: 3.3 g/dL — ABNORMAL LOW (ref 3.5–5.0)
Alkaline Phosphatase: 74 U/L (ref 38–126)
Anion gap: 12 (ref 5–15)
BUN: 23 mg/dL (ref 8–23)
CO2: 22 mmol/L (ref 22–32)
Calcium: 8.7 mg/dL — ABNORMAL LOW (ref 8.9–10.3)
Chloride: 98 mmol/L (ref 98–111)
Creatinine, Ser: 1.01 mg/dL (ref 0.61–1.24)
GFR, Estimated: >60 mL/min (ref >60)
Glucose, Bld: 157 mg/dL — ABNORMAL HIGH (ref 70–99)
Potassium: 4 mmol/L (ref 3.5–5.1)
Sodium: 132 mmol/L — ABNORMAL LOW (ref 135–145)
Total Bilirubin: 0.4 mg/dL (ref 0.3–1.2)
Total Protein: 7.5 g/dL (ref 6.5–8.1)

## 2020-06-26 LAB — PROTIME-INR
INR: 1.1 (ref 0.8–1.2)
Prothrombin Time: 13.8 seconds (ref 11.4–15.2)

## 2020-06-26 LAB — TSH: TSH: 1.057 u[IU]/mL (ref 0.350–4.500)

## 2020-06-26 LAB — MAGNESIUM: Magnesium: 2.1 mg/dL (ref 1.7–2.4)

## 2020-06-26 LAB — TROPONIN I (HIGH SENSITIVITY): Troponin I (High Sensitivity): 10 ng/L (ref <18)

## 2020-06-26 IMAGING — DX DG CHEST 1V PORT
1 series · 2 of 2 positions shown · non-contrast
Comparison: [DATE]

CLINICAL DATA: Fever, chills, weakness, cough

EXAM:
PORTABLE CHEST 1 VIEW

[Series 1: chest ap · 0.14mm/px · 2 of 2 slices shown]
[im 1/2]
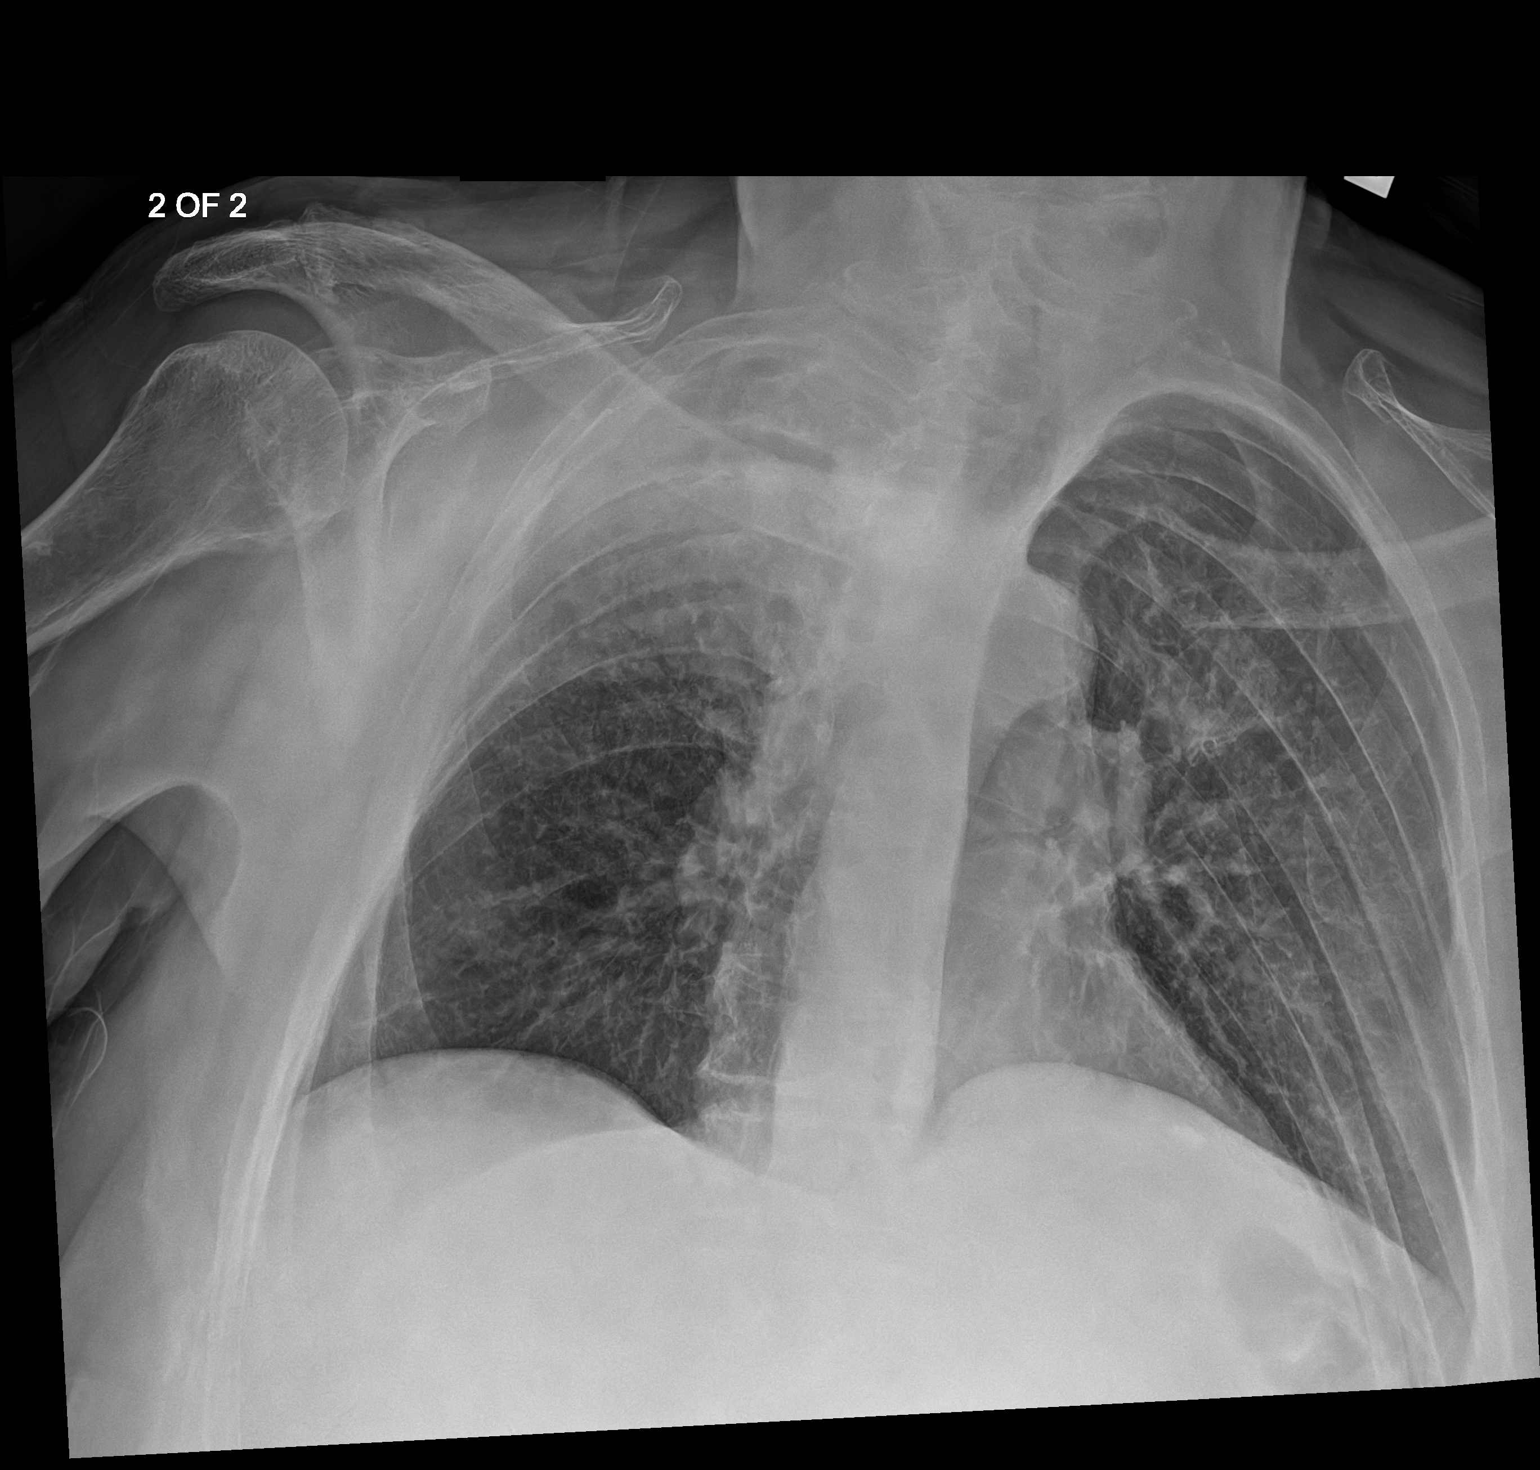
[im 2/2]
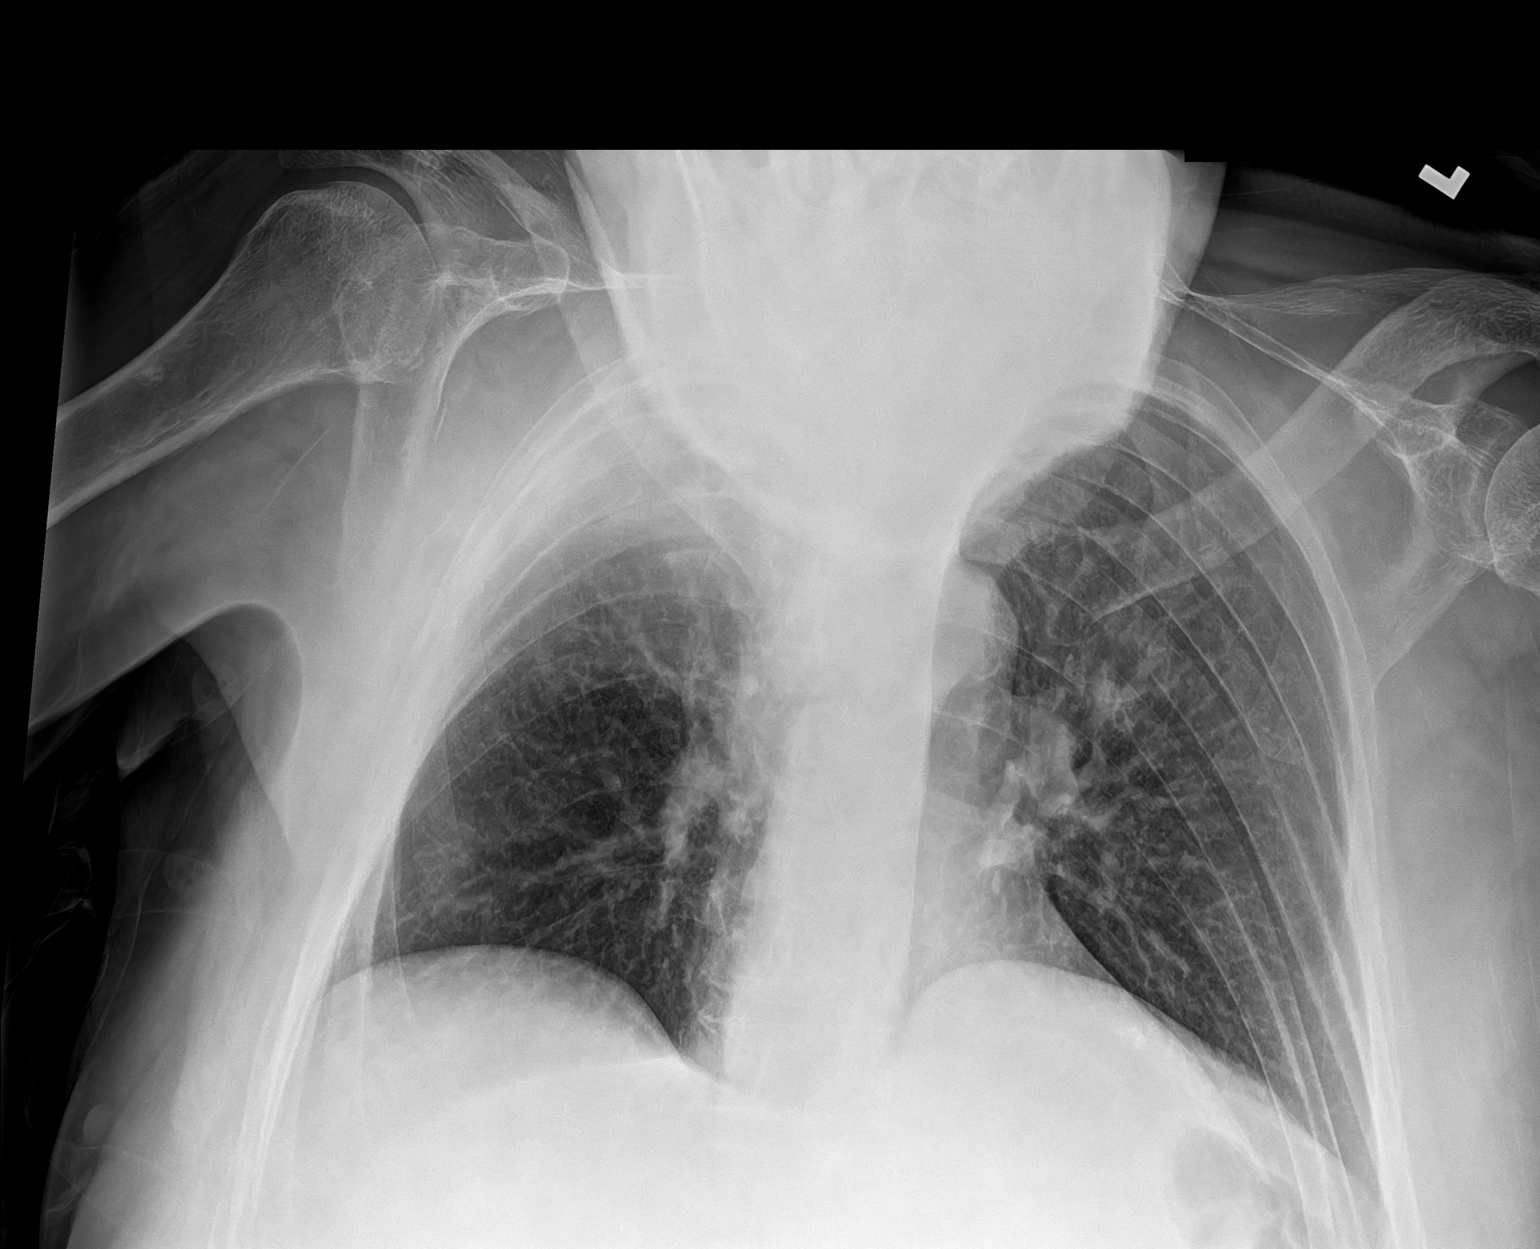

[2 of 2 positions shown; findings below may reference images not displayed]

FINDINGS: Consolidation again seen in the right upper lobe compatible with
pneumonia. This has improved since prior study. No confluent opacity
on the left. Heart is normal size. Aortic atherosclerosis. No
effusions. No acute bony abnormality.
IMPRESSION: Continued but improving right upper lobe consolidation/pneumonia.

## 2020-06-26 IMAGING — CT CT HEAD W/O CM
3 series · 15 of 47 positions shown, 18 images · non-contrast
Comparison: None available.

CLINICAL DATA: Initial evaluation for acute neuro deficit, stroke
suspected.

EXAM:
CT HEAD WITHOUT CONTRAST
TECHNIQUE: Contiguous axial images were obtained from the base of the skull
through the vertex without intravenous contrast.

[Series 2: head w o · axial · 0.45mm/px · z∈[-9,+131]mm · 9 of 34 slices shown, 12 images]
[im 3/34  brain]
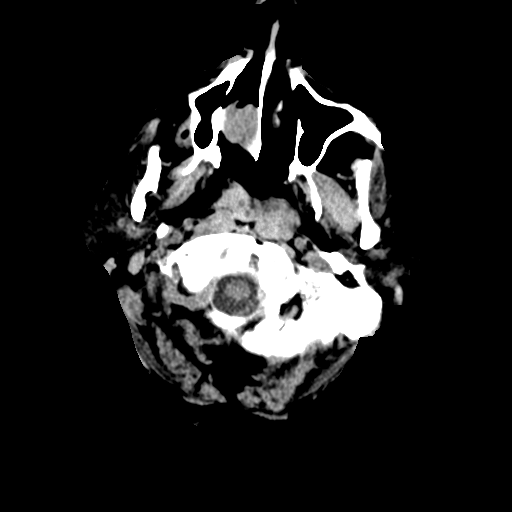
[im 3/34  bone]
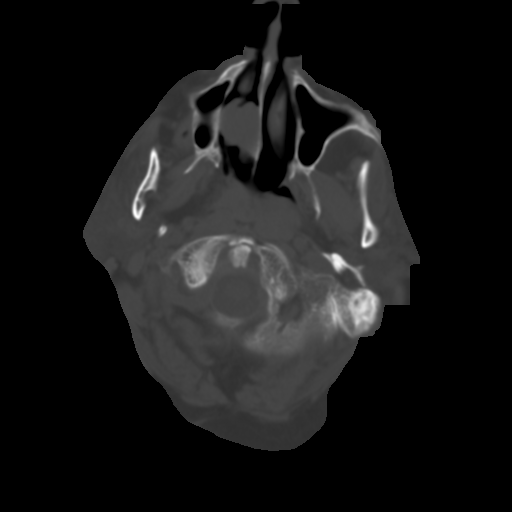
[im 6/34  brain]
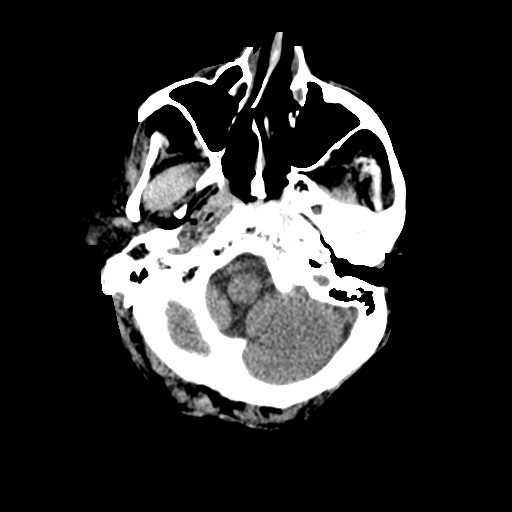
[im 10/34  brain]
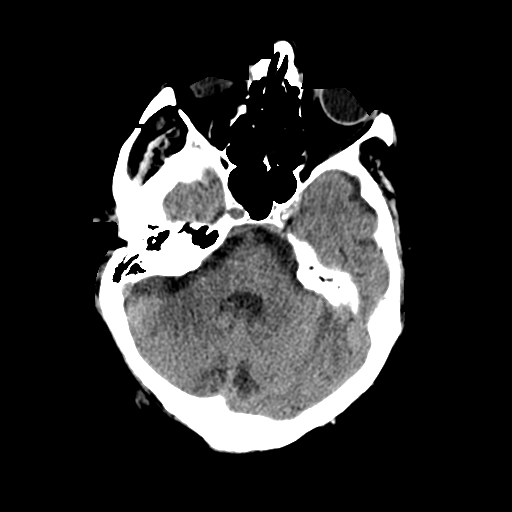
[im 13/34  brain]
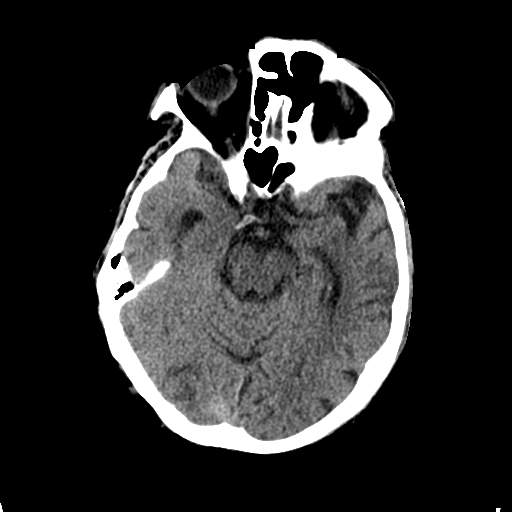
[im 18/34  brain]
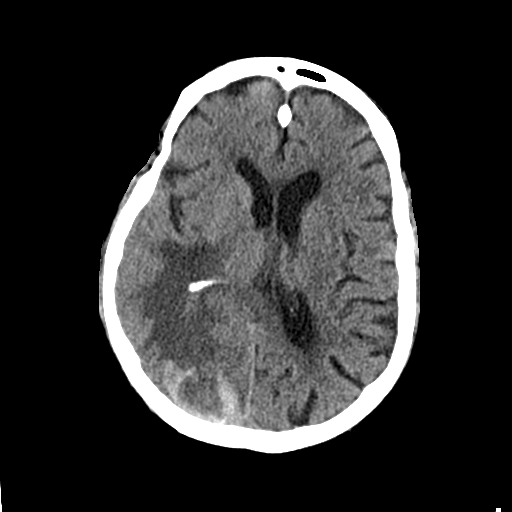
[im 18/34  bone]
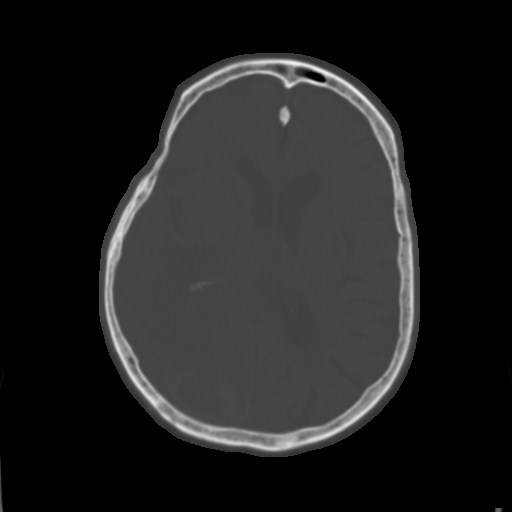
[im 21/34  brain]
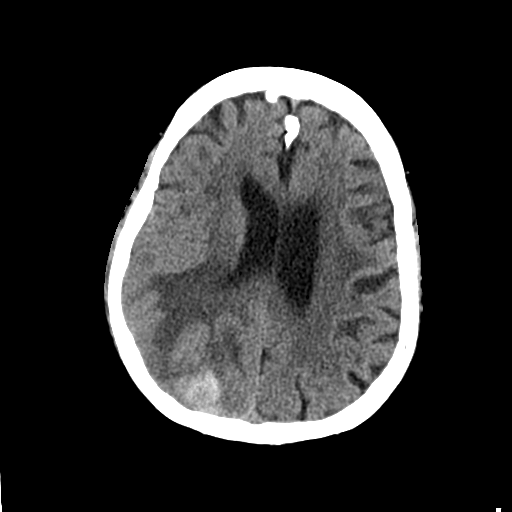
[im 24/34  brain]
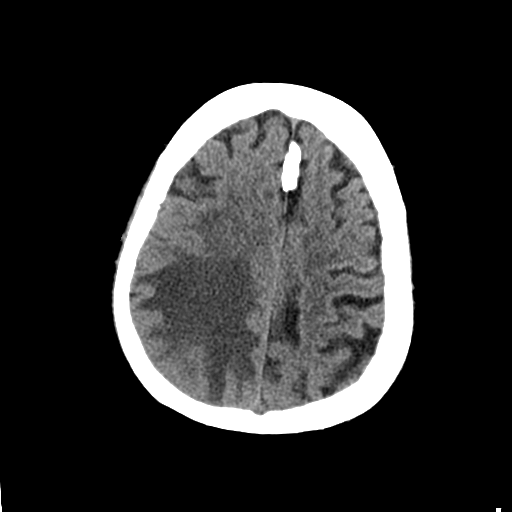
[im 28/34  brain]
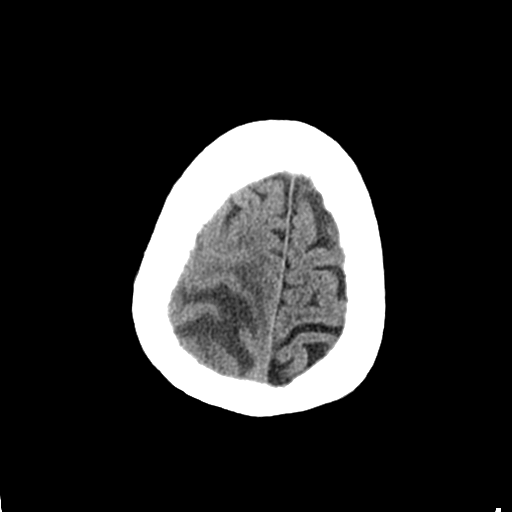
[im 31/34  brain]
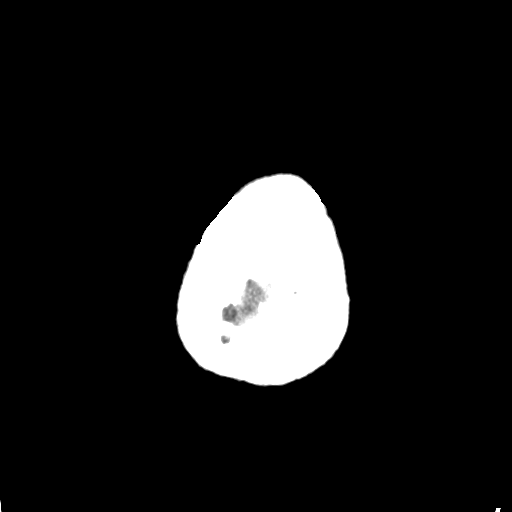
[im 31/34  bone]
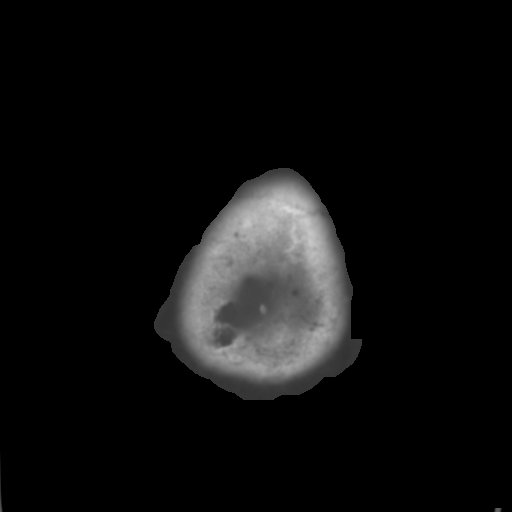

[Series 4: coronal soft · coronal · 0.34mm/px · 3 of 82 slices shown]
[im 28/82  brain]
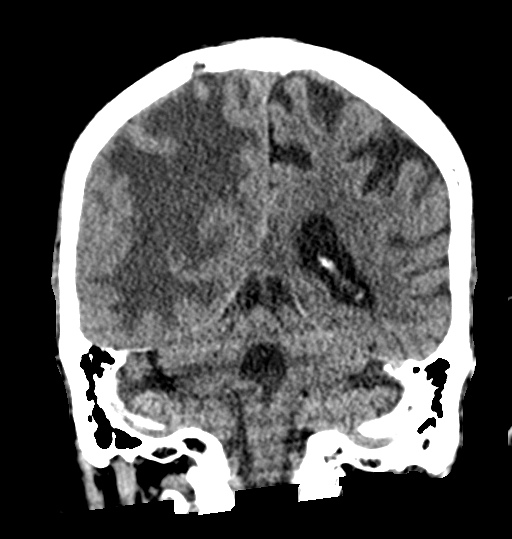
[im 37/82  brain]
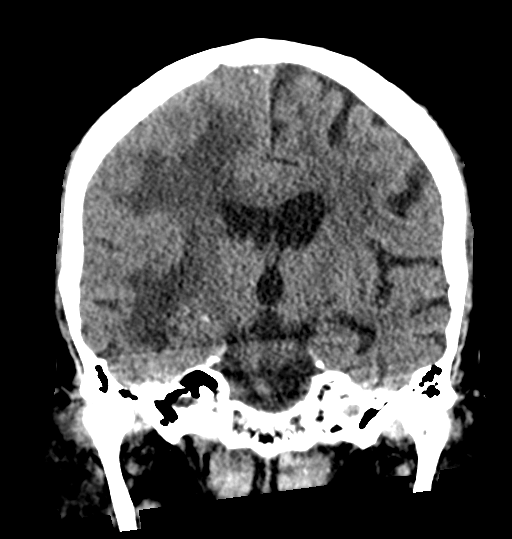
[im 46/82  brain]
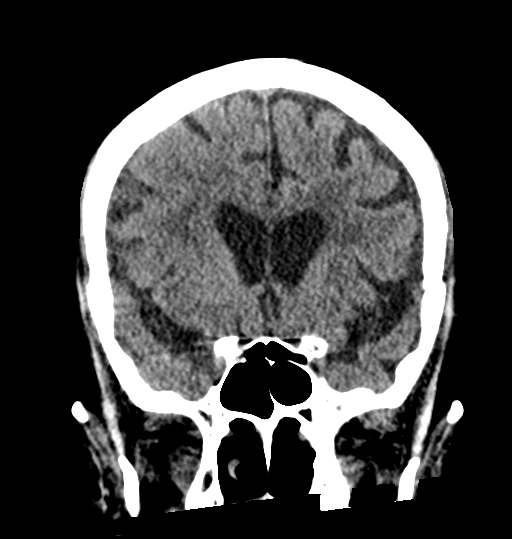

[Series 5: sagittal soft · sagittal · 0.37mm/px · 3 of 61 slices shown]
[im 24/61  brain]
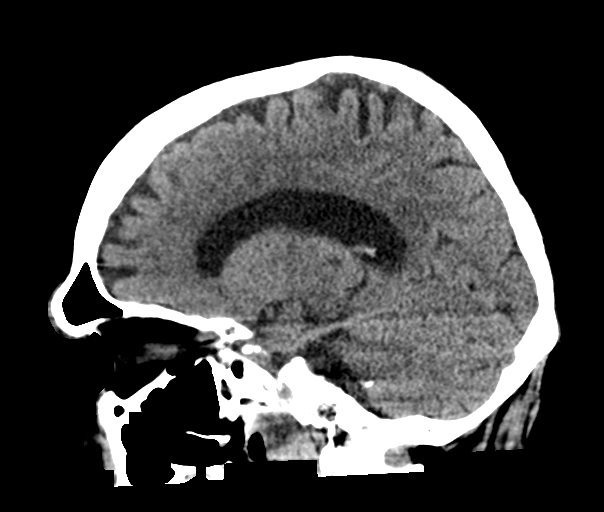
[im 31/61  brain]
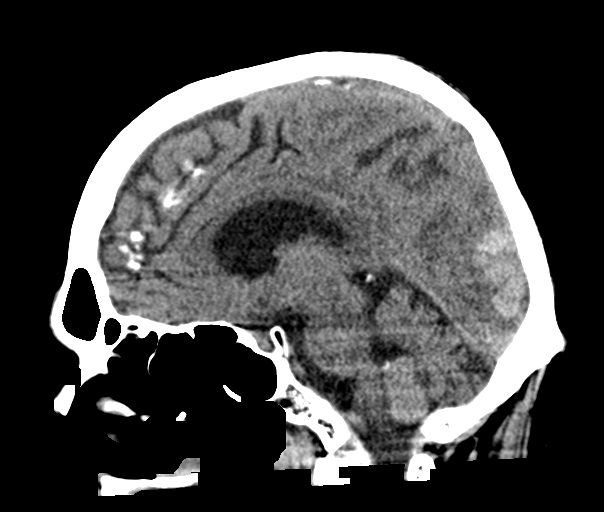
[im 37/61  brain]
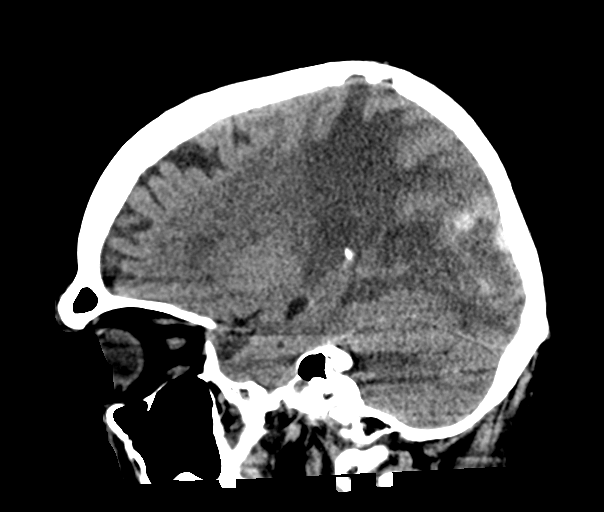

[15 of 47 positions shown; findings below may reference images not displayed]

FINDINGS: Brain: Age-related cerebral atrophy with chronic microvascular
ischemic disease. There is an irregular heterogeneous mass
positioned at the right occipital cortex measuring 3.5 x 4.3 x
cm in greatest dimensions, most concerning for a primary CNS
neoplasm. Associated hyperdensity suggests a degree of associated
hemorrhage. Associated vasogenic edema throughout the posterior
right parieto-occipital region with partial effacement of the
posterior right lateral ventricle. No more than trace 2 mm
right-to-left shift. Basilar cisterns remain patent.

No other acute intracranial hemorrhage or large vessel territory
infarct. No extra-axial fluid collection.

Vascular: No hyperdense vessel. Scattered vascular calcifications
noted within the carotid siphons.

Skull: Scalp soft tissues and calvarium within normal limits.

Sinuses/Orbits: Globes and orbital soft tissues demonstrate no acute
finding. Paranasal sinuses are clear. No mastoid effusion.

Other: None.
IMPRESSION: 1. 3.5 x 4.3 x 4.2 cm irregular heterogeneous mass positioned at the
right occipital cortex. Intrinsic hyperdensity suggests associated
hemorrhage. Vasogenic edema throughout the posterior right
parieto-occipital region with trace 2 mm right-to-left shift.
Differential considerations include a primary CNS neoplasm versus
solitary intracranial metastasis.
2. Age-related cerebral atrophy with chronic microvascular ischemic
disease.

Critical Value/emergent results were called by telephone at the time
of interpretation on [DATE] at [DATE] to provider KIESHA ,
who verbally acknowledged these results.

## 2020-06-26 MED ORDER — DEXAMETHASONE 4 MG PO TABS
4.0000 mg | ORAL_TABLET | Freq: Four times a day (QID) | ORAL | Status: DC
  Administered 2020-06-27 – 2020-06-29 (×12): 4 mg via ORAL
  Filled 2020-06-26 (×12): qty 1

## 2020-06-26 MED ORDER — SODIUM CHLORIDE 0.9 % IV SOLN: INTRAVENOUS | Status: AC

## 2020-06-26 MED ORDER — SODIUM CHLORIDE 0.9 % IV SOLN
2.0000 g | Freq: Every day | INTRAVENOUS | Status: AC
  Administered 2020-06-27 – 2020-06-30 (×5): 2 g via INTRAVENOUS
  Filled 2020-06-26 (×5): qty 20

## 2020-06-26 MED ORDER — HYDROCODONE-ACETAMINOPHEN 5-325 MG PO TABS
1.0000 | ORAL_TABLET | Freq: Four times a day (QID) | ORAL | Status: DC | PRN
  Administered 2020-06-27 – 2020-07-13 (×8): 1 via ORAL
  Filled 2020-06-26 (×8): qty 1

## 2020-06-26 MED ORDER — ONDANSETRON HCL 4 MG/2ML IJ SOLN
4.0000 mg | Freq: Four times a day (QID) | INTRAMUSCULAR | Status: DC | PRN
  Administered 2020-06-27: 4 mg via INTRAVENOUS
  Filled 2020-06-26: qty 2

## 2020-06-26 MED ORDER — TRAZODONE HCL 50 MG PO TABS
50.0000 mg | ORAL_TABLET | Freq: Every day | ORAL | Status: DC
  Administered 2020-06-27 – 2020-07-13 (×18): 50 mg via ORAL
  Filled 2020-06-26 (×18): qty 1

## 2020-06-26 MED ORDER — SENNOSIDES-DOCUSATE SODIUM 8.6-50 MG PO TABS: 1.0000 | ORAL_TABLET | Freq: Every evening | ORAL | Status: DC | PRN

## 2020-06-26 MED ORDER — AZITHROMYCIN 500 MG PO TABS
500.0000 mg | ORAL_TABLET | Freq: Every day | ORAL | Status: AC
  Administered 2020-06-27 – 2020-07-01 (×5): 500 mg via ORAL
  Filled 2020-06-26: qty 2
  Filled 2020-06-26 (×4): qty 1

## 2020-06-26 MED ORDER — DEXAMETHASONE SODIUM PHOSPHATE 10 MG/ML IJ SOLN
10.0000 mg | Freq: Once | INTRAMUSCULAR | Status: AC
  Administered 2020-06-27: 10 mg via INTRAVENOUS
  Filled 2020-06-26: qty 1

## 2020-06-26 MED ORDER — ATORVASTATIN CALCIUM 10 MG PO TABS
10.0000 mg | ORAL_TABLET | Freq: Every day | ORAL | Status: DC
  Administered 2020-06-27 – 2020-07-14 (×18): 10 mg via ORAL
  Filled 2020-06-26 (×18): qty 1

## 2020-06-26 MED ORDER — ACETAMINOPHEN 325 MG PO TABS
650.0000 mg | ORAL_TABLET | Freq: Four times a day (QID) | ORAL | Status: DC | PRN
  Administered 2020-07-12: 650 mg via ORAL
  Filled 2020-06-26: qty 2

## 2020-06-26 MED ORDER — LORAZEPAM 0.5 MG PO TABS
0.5000 mg | ORAL_TABLET | Freq: Three times a day (TID) | ORAL | Status: AC | PRN
Start: 2020-06-26 — End: 2020-06-28
  Administered 2020-06-27 – 2020-06-28 (×2): 0.5 mg via ORAL
  Filled 2020-06-26 (×2): qty 1

## 2020-06-26 MED ORDER — INSULIN ASPART 100 UNIT/ML ~~LOC~~ SOLN
0.0000 [IU] | SUBCUTANEOUS | Status: DC
  Administered 2020-06-27 (×2): 2 [IU] via SUBCUTANEOUS
  Administered 2020-06-27 (×2): 1 [IU] via SUBCUTANEOUS
  Administered 2020-06-27: 2 [IU] via SUBCUTANEOUS
  Administered 2020-06-28: 3 [IU] via SUBCUTANEOUS
  Administered 2020-06-28: 4 [IU] via SUBCUTANEOUS
  Administered 2020-06-28: 2 [IU] via SUBCUTANEOUS
  Filled 2020-06-26: qty 1

## 2020-06-26 NOTE — ED Provider Notes (Signed)
[facility] EMERGENCY DEPARTMENT Provider Note   CSN: [id number] Arrival date & time: 06/26/20  Thomas Garcia     History Chief Complaint  Patient presents with  . Generalized Body Aches    Thomas Garcia is a 80 y.o. male.  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  He does take clopidogrel, his family member who accompanies him who is his Thomas states that he has had vascular compromise to his left leg in the past which is what was the cause of the need for the amputation in the first place.  The patient was in his usual state of health a couple of months ago but sometime over the last month he started to have progressive generalized weakness, he is becoming more sleepy, seems to fall asleep during the day at inappropriate times, has periods where he is so weak that he needs help getting up in the bed but sometimes where he is more awake.  He also has some uncontrollable shaking of his arms with intention tremor and sometimes it is not there either.  He has not had any new medications, he does take his daily metformin for diabetes, he does not use injectable insulin.  He has not had any fevers or chills but states that sometimes he feels hot and cold which comes on spontaneously.  No diarrhea, no constipation, no dysuria though he does note a lack of continence and sometimes he feels like he cannot make it to the bathroom and accidentally urinates on himself.  This is not a new problem either.  The patient does report that he smokes cigarettes, he stopped about 5 days ago but has a lifelong history of smoking.  Review of the medical record shows that the patient had been seen in February 2022 and diagnosed with community-acquired pneumonia approximately 1 month ago, treated for the same.  He has been diagnosed with anemia by Dr. Anastasio Champion his internist.  The medical record states that the patient should be taking  Lipitor Plavix lisinopril Metformin and trazodone all of which accompany the patient today.  He is also supposed to be taking iron but he did not bring that pill and is unsure if he is taking  Past Medical History:  Diagnosis Date  . Diabetes mellitus without complication ([place])   . Kidney stones   . PONV (postoperative nausea and vomiting)     Patient Active Problem List   Diagnosis Date Noted  . Mass of upper lobe of right lung 05/27/2020  . Colon cancer screening 06/22/2019  . Abnormality of gait 11/06/2018  . Diabetes mellitus type 2 in nonobese (HCC)   . 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 Postoperative pain   . Unilateral complete BKA, left, sequela ([place])   . Acute blood loss anemia   . Essential hypertension   . Neuropathic pain   . Unilateral complete BKA, left, initial encounter ([place])   . Tobacco abuse   . Benign essential HTN   . Diabetic peripheral neuropathy ([place])   . Post-operative pain   . 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 PVD (peripheral vascular disease) ([place])   . 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 ([place]) 09/08/2018  . Osteomyelitis of second toe of left foot ([place]) 09/08/2018  . Cellulitis of left foot 09/08/2018  . Leukocytosis 09/08/2018  . Fever and chills 09/08/2018  . MOLE 10/26/2008  . Type 2 diabetes mellitus with  peripheral vascular disease ([place]) 10/26/2008  . OVERWEIGHT 10/26/2008  . NICOTINE ADDICTION 10/26/2008  . ACUTE CYSTITIS 10/26/2008  . FATIGUE 10/26/2008  . COUGH 10/26/2008  . ELECTROCARDIOGRAM, ABNORMAL 10/26/2008    Past Surgical History:  Procedure Laterality Date  . ABDOMINAL AORTOGRAM W/LOWER EXTREMITY N/A 09/17/2018   Procedure: ABDOMINAL AORTOGRAM W/LOWER EXTREMITY;  Surgeon: Marty Heck, MD;  Location: [facility];  Service: Cardiovascular;  Laterality: N/A;  . AMPUTATION Left 09/22/2018   Procedure: AMPUTATION BELOW KNEE LEFT;  Surgeon: Marty Heck, MD;  Location: [place];  Service: Vascular;  Laterality: Left;  .  AMPUTATION TOE Left 09/10/2018   Procedure: AMPUTATION OF THIRD TOE LEFT FOOT, DEBRIDEMENT OF ULCERS ON SECOND TOE AND PARTIAL AMPUTATION SECOND TOE ON LEFT FOOT;  Surgeon: Virl Cagey, MD;  Location: [facility];  Service: General;  Laterality: Left;  . APPLICATION OF WOUND VAC Left 09/18/2018   Procedure: APPLICATION OF WOUND VAC;  Surgeon: Marty Heck, MD;  Location: [place];  Service: Vascular;  Laterality: Left;  . CYSTOSCOPY  05/08/2012   Procedure: CYSTOSCOPY;  Surgeon: Marissa Nestle, MD;  Location: [facility];  Service: Urology;  Laterality: N/A;  Evacuation of clots  . KIDNEY SURGERY     sutures   . PERIPHERAL VASCULAR BALLOON ANGIOPLASTY  09/17/2018   Procedure: PERIPHERAL VASCULAR BALLOON ANGIOPLASTY;  Surgeon: Marty Heck, MD;  Location: [place] [facility];  Service: Cardiovascular;;  LT SFA and AT  . SPLENECTOMY    . TRANSMETATARSAL AMPUTATION Left 09/18/2018   Procedure: TRANSMETATARSAL AMPUTATION LEFT FOOT;  Surgeon: Marty Heck, MD;  Location: [place];  Service: Vascular;  Laterality: Left;  . TRANSURETHRAL RESECTION OF PROSTATE  05/14/2012   Procedure: TRANSURETHRAL RESECTION OF THE PROSTATE (TURP);  Surgeon: Marissa Nestle, MD;  Location: [facility];  Service: Urology;  Laterality: N/A;       Family History  Problem Relation Age of Onset  . Diabetes Mother     Social History   Tobacco Use  . Smoking status: Former Smoker    Packs/day: 0.50    Years: 45.00    Pack years: 22.50    Types: Cigarettes    Quit date: 05/20/2020    Years since quitting: 0.1  . Smokeless tobacco: Never Used  Vaping Use  . Vaping Use: Never used  Substance Use Topics  . Alcohol use: No  . Drug use: No    Home Medications Prior to Admission medications   Medication Sig Start Date End Date Taking? Authorizing Provider  acetaminophen (TYLENOL) 325 MG tablet Take 2 tablets (650 mg total) by mouth every 4 (four) hours as needed for headache or mild pain. Patient taking  differently: Take 650 mg by mouth daily. 09/29/18  Yes Angiulli, Lavon Paganini, PA-C  atorvastatin (LIPITOR) 10 MG tablet Take 1 tablet (10 mg total) by mouth daily at 6 PM. 04/14/20  Yes Gosrani, Nimish C, MD  Cal Carb-Mag Hydrox-Simeth (ROLAIDS ADVANCED) 1000-200-40 MG CHEW Chew 1 tablet by mouth daily as needed (for indigestion).   Yes [provider]  clopidogrel (PLAVIX) 75 MG tablet Take 1 tablet (75 mg total) by mouth daily. 04/14/20  Yes Gosrani, Nimish C, MD  ferrous sulfate 324 MG TBEC Take 324 mg by mouth daily with breakfast.   Yes [provider]  lisinopril (ZESTRIL) 5 MG tablet Take 1 tablet (5 mg total) by mouth daily. 04/14/20  Yes Gosrani, Nimish C, MD  metFORMIN (GLUCOPHAGE) 500 MG tablet  Take 1 tablet (500 mg total) by mouth 2 (two) times daily with a meal. 04/14/20  Yes Gosrani, Nimish C, MD  traZODone (DESYREL) 50 MG tablet Take 1 tablet (50 mg total) by mouth at bedtime. 04/14/20  Yes Gosrani, Nimish C, MD  nicotine (NICODERM CQ) 14 mg/24hr patch Place 1 patch (14 mg total) onto the skin daily. Patient not taking: No sig reported 11/30/19   Doree Albee, MD    Allergies    Patient has no known allergies.  Review of Systems   Review of Systems  All other systems reviewed and are negative.   Physical Exam Updated Vital Signs BP (!) 112/52   Pulse 88   Temp 98.6 F (37 C) (Oral)   Resp 18   Ht 1.753 m (5\' 9" )   Wt 81.6 kg   SpO2 100%   BMI 26.58 kg/m   Physical Exam Vitals and nursing note reviewed.  Constitutional:      General: He is not in acute distress.    Appearance: He is well-developed.  HENT:     Head: Normocephalic and atraumatic.     Mouth/Throat:     Mouth: Mucous membranes are moist.     Pharynx: No oropharyngeal exudate.  Eyes:     General: No scleral icterus.       Right eye: No discharge.        Left eye: No discharge.     Conjunctiva/sclera: Conjunctivae normal.     Pupils: Pupils are equal, round, and reactive to light.   Neck:     Thyroid: No thyromegaly.     Vascular: No JVD.  Cardiovascular:     Rate and Rhythm: Normal rate and regular rhythm.     Heart sounds: Normal heart sounds. No murmur heard. No friction rub. No gallop.   Pulmonary:     Effort: Pulmonary effort is normal. No respiratory distress.     Breath sounds: Normal breath sounds. No wheezing or rales.  Abdominal:     General: Bowel sounds are normal. There is no distension.     Palpations: Abdomen is soft. There is no mass.     Tenderness: There is no abdominal tenderness.  Musculoskeletal:        General: No tenderness. Normal range of motion.     Cervical back: Normal range of motion and neck supple.     Comments: Left lower extremity below the knee amputation, stump looks good, no signs of redness or swelling  Lymphadenopathy:     Cervical: No cervical adenopathy.  Skin:    General: Skin is warm and dry.     Findings: No erythema or rash.  Neurological:     Mental Status: He is alert.     Coordination: Coordination normal.     Comments: Awake alert and able to follow commands, the patient has minimal weakness, he is able to sit up in the bed all by himself, he moves all 4 extremities with normal strength, follows commands without difficulty, memory is completely intact, no tremors with  Psychiatric:        Behavior: Behavior normal.     ED Results / Procedures / Treatments   Labs (all labs ordered are listed, but only abnormal results are displayed) Labs Reviewed  CBC WITH DIFFERENTIAL/PLATELET - Abnormal; Notable for the following components:      Result Value   WBC 12.8 (*)    RBC 3.63 (*)    Hemoglobin 7.8 (*)    HCT  27.7 (*)    MCV 76.3 (*)    MCH 21.5 (*)    MCHC 28.2 (*)    RDW 18.6 (*)    Platelets 745 (*)    Neutro Abs 10.3 (*)    All other components within normal limits  COMPREHENSIVE METABOLIC PANEL - Abnormal; Notable for the following components:   Sodium 132 (*)    Glucose, Bld 157 (*)    Calcium  8.7 (*)    Albumin 3.3 (*)    AST 12 (*)    All other components within normal limits  TSH  MAGNESIUM  PROTIME-INR  URINALYSIS, ROUTINE W REFLEX MICROSCOPIC  TROPONIN I (HIGH SENSITIVITY)    EKG None  Radiology CT Head Wo Contrast  Result Date: 06/26/2020 CLINICAL DATA:  Initial evaluation for acute neuro deficit, stroke suspected. EXAM: CT HEAD WITHOUT CONTRAST TECHNIQUE: Contiguous axial images were obtained from the base of the skull through the vertex without intravenous contrast. COMPARISON:  None available. 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 There is an irregular heterogeneous mass positioned at the right occipital cortex measuring 3.5 x 4.3 x 4.2 cm in greatest dimensions, most concerning for a primary CNS neoplasm. Associated hyperdensity suggests a degree of associated hemorrhage. Associated vasogenic edema throughout the posterior right parieto-occipital region with partial effacement of the posterior right lateral ventricle. No more than trace 2 mm right-to-left shift. Basilar cisterns remain patent. No other acute intracranial hemorrhage or large vessel territory infarct. No extra-axial fluid collection. Vascular: No hyperdense vessel. Scattered vascular calcifications noted within the carotid siphons. Skull: Scalp soft tissues and calvarium within normal limits. Sinuses/Orbits: Globes and orbital soft tissues demonstrate no acute finding. Paranasal sinuses are clear. No mastoid effusion. Other: None. IMPRESSION: 1. 3.5 x 4.3 x 4.2 cm irregular heterogeneous mass positioned at the right occipital cortex. Intrinsic hyperdensity suggests associated hemorrhage. Vasogenic edema throughout the posterior right parieto-occipital region with trace 2 mm right-to-left shift. Differential considerations include a primary CNS neoplasm versus solitary intracranial metastasis. 2. Age-related cerebral atrophy with chronic microvascular ischemic  disease. Critical Value/emergent results were called by telephone at the time of interpretation on 06/26/2020 at 9:10 pm to provider [facility] , who verbally acknowledged these results. Electronically Signed   By: Jeannine Boga M.D.   On: 06/26/2020 21:13   DG Chest Portable 1 View  Result Date: 06/26/2020 CLINICAL DATA:  Fever, chills, weakness, cough EXAM: PORTABLE CHEST 1 VIEW COMPARISON:  05/27/2020 FINDINGS: Consolidation again seen in the right upper lobe compatible with pneumonia. This has improved since prior study. No confluent opacity on the left. Heart is normal size. Aortic atherosclerosis. No effusions. No acute bony abnormality. IMPRESSION: Continued but improving right upper lobe consolidation/pneumonia. Electronically Signed   By: Rolm Baptise M.D.   On: 06/26/2020 20:27    Procedures .Critical Care Performed by: Noemi Chapel, MD Authorized by: Noemi Chapel, MD   Critical care provider statement:    Critical care time (minutes):  35   Critical care time was exclusive of:  Separately billable procedures and treating other patients and teaching time   Critical care was necessary to treat or prevent imminent or life-threatening deterioration of the following conditions:  CNS failure or compromise   Critical care was time spent personally by me on the following activities:  Blood draw for specimens, development of treatment plan with patient or surrogate, discussions with consultants, evaluation of patient's response to treatment, examination of patient, obtaining history from patient or surrogate,  ordering and performing treatments and interventions, ordering and review of laboratory studies, ordering and review of radiographic studies, pulse oximetry, re-evaluation of patient's condition and review of old charts Comments:           Medications Ordered in ED Medications - No data to display  ED Course  I have reviewed the triage vital signs and the nursing  notes.  Pertinent labs & imaging results that were available during my care of the patient were reviewed by me and considered in my medical decision making (see chart for details).  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 [BM]  2159 Labs pending, the patient is chronically anemic, vital signs remain unremarkable, on repeat exam the patient does not fact have some left-sided neglect.  I have personally looked at the CT scan which shows that there is edema and a mass with some hemorrhagic transformation of the mass.  I discussed the case with the neurosurgeon, Dr. Reatha Armour, he agrees to see the patient in consultation and recommends to be transferred to [facility] for further evaluation  Hospitalist will be paged [BM]    Clinical Course User Index [BM] Noemi Chapel, MD   MDM Rules/Calculators/A&P                          The patient does have what appears to be a right upper lobe infiltrate, compared to prior x-rays it looks like it may be better, a CT angiogram of the chest was performed a month ago and on that scan they found what appeared to be a masslike consolidation of the right upper lobe and right suprahilar region which could have been an infection or developing abscess or malignancy, the patient does not really have a cough or fever though he does feel hot and cold at times.  No bronchoscopy was performed since that time  CBC shows chronic anemia, metabolic panel overall unremarkable  Hospitalist will be paged for admission and transfer to higher level of care for neurosurgery and oncology evaluation  Final Clinical Impression(s) / ED Diagnoses Final diagnoses:  Malignant neoplasm of brain, unspecified location [facility])  Right-sided nontraumatic intracerebral hemorrhage, unspecified cerebral location [facility])    Rx / DC Orders ED Discharge  Orders    None       Noemi Chapel, MD 06/26/20 2235

## 2020-06-26 NOTE — H&P (Signed)
History and Physical    Thomas Garcia NWG:[id number] DOB: Oct 25, 1940 DOA: 06/26/2020  PCP: Doree Albee, MD   Patient coming from: Home   Chief Complaint: General weakness, fatigue, nausea, loss of appetite, cough   HPI: Thomas Garcia is a 80 y.o. male with medical history significant for hypertension, type 2 diabetes mellitus, and peripheral arterial disease, now presenting to the emergency department for evaluation of general weakness, fatigue, nausea, loss of appetite, and cough with scant hemoptysis.  Patient was seen in the emergency department 1 month ago complaining of shortness of breath and hemoptysis, was found to have small bilateral PE that were age-indeterminate as well as a masslike consolidation involving the right upper lobe with some cavitation, and was recommended for admission to the hospital but refused at that time and went home.  Since then, he has had progressive general weakness, fatigue, nausea, progressive loss of appetite, and seems to be confused at times per report of his son at the bedside.  He continues to have cough and shortness of breath and continues to have some blood-streaked sputum.  He has chills intermittently but has not taken his temperature.  Resting tremor seems to be worsening per report of his son.  Patient reports some headaches but denies focal numbness or weakness.  Reports that he recently quit smoking. He uses a wheelchair at baseline.   ED Course: Upon arrival to the ED, patient is found to be afebrile, saturating mid 90s on room air, and with stable blood pressure in the 90s to low 578I systolic.  EKG with sinus rhythm, RBBB, and LAFB.  Chest x-ray with improving right upper lobe consolidation.  Head CT concerning for a mass at the right occipital cortex with associated hemorrhage and vasogenic edema with 2 mm right to left shift.  Chemistry panel features a glucose 157 and sodium 132.  CBC with increased leukocytosis, stable microcytic  anemia, and increasing thrombocytosis.  Neurosurgery was consulted by the ED physician and recommends medical admission to [facility].  Review of Systems:  All other systems reviewed and apart from HPI, are negative.  Past Medical History:  Diagnosis Date  . Diabetes mellitus without complication ([place])   . Kidney stones   . PONV (postoperative nausea and vomiting)     Past Surgical History:  Procedure Laterality Date  . ABDOMINAL AORTOGRAM W/LOWER EXTREMITY N/A 09/17/2018   Procedure: ABDOMINAL AORTOGRAM W/LOWER EXTREMITY;  Surgeon: Marty Heck, MD;  Location: [place] [facility];  Service: Cardiovascular;  Laterality: N/A;  . AMPUTATION Left 09/22/2018   Procedure: AMPUTATION BELOW KNEE LEFT;  Surgeon: Marty Heck, MD;  Location: [place];  Service: Vascular;  Laterality: Left;  . AMPUTATION TOE Left 09/10/2018   Procedure: AMPUTATION OF THIRD TOE LEFT FOOT, DEBRIDEMENT OF ULCERS ON SECOND TOE AND PARTIAL AMPUTATION SECOND TOE ON LEFT FOOT;  Surgeon: Virl Cagey, MD;  Location: [facility];  Service: General;  Laterality: Left;  . APPLICATION OF WOUND VAC Left 09/18/2018   Procedure: APPLICATION OF WOUND VAC;  Surgeon: Marty Heck, MD;  Location: [place];  Service: Vascular;  Laterality: Left;  . CYSTOSCOPY  05/08/2012   Procedure: CYSTOSCOPY;  Surgeon: Marissa Nestle, MD;  Location: [facility];  Service: Urology;  Laterality: N/A;  Evacuation of clots  . KIDNEY SURGERY     sutures   . PERIPHERAL VASCULAR BALLOON ANGIOPLASTY  09/17/2018   Procedure: PERIPHERAL VASCULAR BALLOON ANGIOPLASTY;  Surgeon: Marty Heck, MD;  Location: [place] [facility];  Service: Cardiovascular;;  LT SFA and AT  . SPLENECTOMY    . TRANSMETATARSAL AMPUTATION Left 09/18/2018   Procedure: TRANSMETATARSAL AMPUTATION LEFT FOOT;  Surgeon: Marty Heck, MD;  Location: [place];  Service: Vascular;  Laterality: Left;  . TRANSURETHRAL RESECTION OF PROSTATE  05/14/2012   Procedure:  TRANSURETHRAL RESECTION OF THE PROSTATE (TURP);  Surgeon: Marissa Nestle, MD;  Location: [facility];  Service: Urology;  Laterality: N/A;    Social History:   reports that he quit smoking about 5 weeks ago. His smoking use included cigarettes. He has a 22.50 pack-year smoking history. He has never used smokeless tobacco. He reports that he does not drink alcohol and does not use drugs.  No Known Allergies  Family History  Problem Relation Age of Onset  . Diabetes Mother      Prior to Admission medications   Medication Sig Start Date End Date Taking? Authorizing Provider  acetaminophen (TYLENOL) 325 MG tablet Take 2 tablets (650 mg total) by mouth every 4 (four) hours as needed for headache or mild pain. Patient taking differently: Take 650 mg by mouth daily. 09/29/18  Yes Angiulli, Lavon Paganini, PA-C  atorvastatin (LIPITOR) 10 MG tablet Take 1 tablet (10 mg total) by mouth daily at 6 PM. 04/14/20  Yes Gosrani, Nimish C, MD  Cal Carb-Mag Hydrox-Simeth (ROLAIDS ADVANCED) 1000-200-40 MG CHEW Chew 1 tablet by mouth daily as needed (for indigestion).   Yes [provider]  clopidogrel (PLAVIX) 75 MG tablet Take 1 tablet (75 mg total) by mouth daily. 04/14/20  Yes Gosrani, Nimish C, MD  ferrous sulfate 324 MG TBEC Take 324 mg by mouth daily with breakfast.   Yes [provider]  lisinopril (ZESTRIL) 5 MG tablet Take 1 tablet (5 mg total) by mouth daily. 04/14/20  Yes Gosrani, Nimish C, MD  metFORMIN (GLUCOPHAGE) 500 MG tablet Take 1 tablet (500 mg total) by mouth 2 (two) times daily with a meal. 04/14/20  Yes Gosrani, Nimish C, MD  traZODone (DESYREL) 50 MG tablet Take 1 tablet (50 mg total) by mouth at bedtime. 04/14/20  Yes Gosrani, Nimish C, MD  nicotine (NICODERM CQ) 14 mg/24hr patch Place 1 patch (14 mg total) onto the skin daily. Patient not taking: No sig reported 11/30/19   Doree Albee, MD    Physical Exam: Vitals:   06/26/20 2125 06/26/20 2130 06/26/20 2230 06/26/20 2245   BP: (!) 119/45 (!) 112/52 (!) 112/50   Pulse: 87 88 84 78  Resp: 19 18 18 18   Temp:      TempSrc:      SpO2: 100% 100% 96% 99%  Weight:      Height:        Constitutional: NAD, calm  Eyes: PERTLA, lids and conjunctivae normal ENMT: Mucous membranes are moist. Posterior pharynx clear of any exudate or lesions.   Neck:  supple, no masses  Respiratory: rhonchi on right, no wheezing. No pallor or cyanosis.   Cardiovascular: S1 & S2 heard, regular rate and rhythm. No extremity edema.  Abdomen: No distension, no tenderness, soft. Bowel sounds active.  Musculoskeletal: no clubbing / cyanosis. Status-post left BKA.   Skin: crusted right lower leg lesions. Warm, dry, well-perfused. Neurologic: No facial asymmetry. No dysarthria or aphasia. Sensation to light touch intact. Moving all extremities.  Psychiatric: Alert and oriented to person, place, and situation. Pleasant and cooperative.    Labs and Imaging on Admission: I have personally reviewed following labs  and imaging studies  CBC: Recent Labs  Lab 06/26/20 2045  WBC 12.8*  NEUTROABS 10.3*  HGB 7.8*  HCT 27.7*  MCV 76.3*  PLT 616*   Basic Metabolic Panel: Recent Labs  Lab 06/26/20 2047  NA 132*  K 4.0  CL 98  CO2 22  GLUCOSE 157*  BUN 23  CREATININE 1.01  CALCIUM 8.7*  MG 2.1   GFR: Estimated Creatinine Clearance: 61.3 mL/min (by C-G formula based on SCr of 1.01 mg/dL). Liver Function Tests: Recent Labs  Lab 06/26/20 2047  AST 12*  ALT 11  ALKPHOS 74  BILITOT 0.4  PROT 7.5  ALBUMIN 3.3*   No results for input(s): LIPASE, AMYLASE in the last 168 hours. No results for input(s): AMMONIA in the last 168 hours. Coagulation Profile: Recent Labs  Lab 06/26/20 2047  INR 1.1   Cardiac Enzymes: No results for input(s): CKTOTAL, CKMB, CKMBINDEX, TROPONINI in the last 168 hours. BNP (last 3 results) No results for input(s): PROBNP in the last 8760 hours. HbA1C: No results for input(s): HGBA1C in the last  72 hours. CBG: No results for input(s): GLUCAP in the last 168 hours. Lipid Profile: No results for input(s): CHOL, HDL, LDLCALC, TRIG, CHOLHDL, LDLDIRECT in the last 72 hours. Thyroid Function Tests: Recent Labs    06/26/20 2047  TSH 1.057   Anemia Panel: No results for input(s): VITAMINB12, FOLATE, FERRITIN, TIBC, IRON, RETICCTPCT in the last 72 hours. Urine analysis:    Component Value Date/Time   COLORURINE YELLOW 09/08/2018 1037   APPEARANCEUR CLEAR 09/08/2018 1037   LABSPEC 1.014 09/08/2018 1037   PHURINE 6.0 09/08/2018 1037   GLUCOSEU >=500 (A) 09/08/2018 1037   HGBUR SMALL (A) 09/08/2018 1037   HGBUR trace-intact 10/26/2008 0000   BILIRUBINUR NEGATIVE 09/08/2018 1037   KETONESUR 5 (A) 09/08/2018 1037   PROTEINUR 30 (A) 09/08/2018 1037   UROBILINOGEN 0.2 04/27/2012 1142   NITRITE NEGATIVE 09/08/2018 1037   LEUKOCYTESUR NEGATIVE 09/08/2018 1037   Sepsis Labs: @LABRCNTIP (procalcitonin:4,lacticidven:4) )No results found for this or any previous visit (from the past 240 hour(s)).   Radiological Exams on Admission: CT Head Wo Contrast  Result Date: 06/26/2020 CLINICAL DATA:  Initial evaluation for acute neuro deficit, stroke suspected. EXAM: CT HEAD WITHOUT CONTRAST TECHNIQUE: Contiguous axial images were obtained from the base of the skull through the vertex without intravenous contrast. COMPARISON:  None available. 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 There is an irregular heterogeneous mass positioned at the right occipital cortex measuring 3.5 x 4.3 x 4.2 cm in greatest dimensions, most concerning for a primary CNS neoplasm. Associated hyperdensity suggests a degree of associated hemorrhage. Associated vasogenic edema throughout the posterior right parieto-occipital region with partial effacement of the posterior right lateral ventricle. No more than trace 2 mm right-to-left shift. Basilar cisterns remain patent. No  other acute intracranial hemorrhage or large vessel territory infarct. No extra-axial fluid collection. Vascular: No hyperdense vessel. Scattered vascular calcifications noted within the carotid siphons. Skull: Scalp soft tissues and calvarium within normal limits. Sinuses/Orbits: Globes and orbital soft tissues demonstrate no acute finding. Paranasal sinuses are clear. No mastoid effusion. Other: None. IMPRESSION: 1. 3.5 x 4.3 x 4.2 cm irregular heterogeneous mass positioned at the right occipital cortex. Intrinsic hyperdensity suggests associated hemorrhage. Vasogenic edema throughout the posterior right parieto-occipital region with trace 2 mm right-to-left shift. Differential considerations include a primary CNS neoplasm versus solitary intracranial metastasis. 2. 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 Critical Value/emergent results  were called by telephone at the time of interpretation on 06/26/2020 at 9:10 pm to provider [facility] , who verbally acknowledged these results. Electronically Signed   By: Jeannine Boga M.D.   On: 06/26/2020 21:13   DG Chest Portable 1 View  Result Date: 06/26/2020 CLINICAL DATA:  Fever, chills, weakness, cough EXAM: PORTABLE CHEST 1 VIEW COMPARISON:  05/27/2020 FINDINGS: Consolidation again seen in the right upper lobe compatible with pneumonia. This has improved since prior study. No confluent opacity on the left. Heart is normal size. Aortic atherosclerosis. No effusions. No acute bony abnormality. IMPRESSION: Continued but improving right upper lobe consolidation/pneumonia. Electronically Signed   By: Rolm Baptise M.D.   On: 06/26/2020 20:27    EKG: Independently reviewed. Sinus rhythm, RBBB, LAFB.   Assessment/Plan   1. Brain mass  - Presents with progressive fatigue, general weakness, nausea, loss of appetite, and cough with scant hemoptysis and is found to have mass involving right occipital cortex with hemorrhage,  vasogenic edema, and 2 mm rt to lt shift  - Neurosurgery consulting and much appreciated  - Start Decadron, continue supportive care, hold Plavix pending NSG eval   2. RUL consolidation; hemoptysis  - Masslike RUL consolidation with cavitation noted on CTA chest one month ago but pt left AMA from ED  - He continues to have SOB and productive cough with scant hemoptysis  - Culture sputum, AFB smears, trend procalcitonin, start Rocephin and azithromycin    3. Pulmonary embolism  - Small age-indeterminate bilateral PE noted on CTA one month ago but he was not started on anticoagulation due to hemoptysis  - No hypoxia and no dyspnea at rest; no phlegmasia  - Continue to hold anticoagulation in light of hemorrhagic brain mass   4. Type II DM  - Most recent A1c was 5.6% in January 2022 - Check CBGs and use low-intensity SSI if needed    5. Hypertension  - SBP 90s in ED and lisinopril held on admission    6. PAD  - No acute ischemia  - Continue statin, hold Plavix pending NSG consultation   7. Hyponatremia  - Mild hyponatremia present on admission in setting of hypovolemia  - Hydrate with NS, repeat chem panel in am    DVT prophylaxis: SCDs  Code Status: Full  Level of Care: Level of care: Telemetry Medical Family Communication: Discussed with son at bedside Disposition Plan:  Patient is from: Home  Anticipated d/c is to: TBD Anticipated d/c date is: ~06/30/20 Patient currently: Pending neurosurgical consultation  Consults called: Neurosurgery  Admission status: inpatient     Vianne Bulls, MD [facility]  06/26/2020, 11:52 PM

## 2020-06-26 NOTE — ED Notes (Signed)
Pt c/o generalized weakness for a month.  States he has an appt with PCP on 4/6

## 2020-06-26 NOTE — ED Notes (Signed)
Dry heaves for ongoing for days, no appetite

## 2020-06-26 NOTE — H&P (Incomplete)
History and Physical    Thomas Garcia KGM:[id number] DOB: 02-25-1941 DOA: 06/26/2020  PCP: Doree Albee, MD   Patient coming from: Home   Chief Complaint: General weakness, fatigue, nausea, loss of appetite, cough   HPI: Thomas Garcia is a 80 y.o. male with medical history significant for hypertension, type 2 diabetes mellitus, and peripheral arterial disease, now presenting to the emergency department for evaluation of general weakness, fatigue, nausea, loss of appetite, and cough with scant hemoptysis.  Patient was seen in the emergency department 1 month ago complaining of shortness of breath and hemoptysis, was found to have small bilateral PE that were age-indeterminate as well as a masslike consolidation involving the right upper lobe with some cavitation, and was recommended for admission to the hospital but refused at that time and went home.  Since then, he has had progressive general weakness, fatigue, nausea, progressive loss of appetite, and seems to be confused at times per report of his son at the bedside.  He continues to have cough and shortness of breath and continues to have some blood-streaked sputum.  He has chills intermittently but has not taken his temperature.  Resting tremor seems to be worsening per report of his son.  Patient reports some headaches but denies focal numbness or weakness.  Reports that he recently quit smoking. He uses a wheelchair at baseline.   ED Course: Upon arrival to the ED, patient is found to be afebrile, saturating mid 90s on room air, and with stable blood pressure in the 90s to low 644I systolic.  EKG with sinus rhythm, RBBB, and LAFB.  Chest x-ray with improving right upper lobe consolidation.  Head CT concerning for a mass at the right occipital cortex with associated hemorrhage and vasogenic edema with 2 mm right to left shift.  Chemistry panel features a glucose 157 and sodium 132.  CBC with increased leukocytosis, stable microcytic  anemia, and increasing thrombocytosis.  Neurosurgery was consulted by the ED physician and recommends medical admission to [facility].  Review of Systems:  All other systems reviewed and apart from HPI, are negative.  Past Medical History:  Diagnosis Date  . Diabetes mellitus without complication ([place])   . Kidney stones   . PONV (postoperative nausea and vomiting)     Past Surgical History:  Procedure Laterality Date  . ABDOMINAL AORTOGRAM W/LOWER EXTREMITY N/A 09/17/2018   Procedure: ABDOMINAL AORTOGRAM W/LOWER EXTREMITY;  Surgeon: Marty Heck, MD;  Location: [place] [facility];  Service: Cardiovascular;  Laterality: N/A;  . AMPUTATION Left 09/22/2018   Procedure: AMPUTATION BELOW KNEE LEFT;  Surgeon: Marty Heck, MD;  Location: [place];  Service: Vascular;  Laterality: Left;  . AMPUTATION TOE Left 09/10/2018   Procedure: AMPUTATION OF THIRD TOE LEFT FOOT, DEBRIDEMENT OF ULCERS ON SECOND TOE AND PARTIAL AMPUTATION SECOND TOE ON LEFT FOOT;  Surgeon: Virl Cagey, MD;  Location: [facility];  Service: General;  Laterality: Left;  . APPLICATION OF WOUND VAC Left 09/18/2018   Procedure: APPLICATION OF WOUND VAC;  Surgeon: Marty Heck, MD;  Location: [place];  Service: Vascular;  Laterality: Left;  . CYSTOSCOPY  05/08/2012   Procedure: CYSTOSCOPY;  Surgeon: Marissa Nestle, MD;  Location: [facility];  Service: Urology;  Laterality: N/A;  Evacuation of clots  . KIDNEY SURGERY     sutures   . PERIPHERAL VASCULAR BALLOON ANGIOPLASTY  09/17/2018   Procedure: PERIPHERAL VASCULAR BALLOON ANGIOPLASTY;  Surgeon: Marty Heck, MD;  Location: [place] [facility];  Service: Cardiovascular;;  LT SFA and AT  . SPLENECTOMY    . TRANSMETATARSAL AMPUTATION Left 09/18/2018   Procedure: TRANSMETATARSAL AMPUTATION LEFT FOOT;  Surgeon: Marty Heck, MD;  Location: [place];  Service: Vascular;  Laterality: Left;  . TRANSURETHRAL RESECTION OF PROSTATE  05/14/2012   Procedure:  TRANSURETHRAL RESECTION OF THE PROSTATE (TURP);  Surgeon: Marissa Nestle, MD;  Location: [facility];  Service: Urology;  Laterality: N/A;    Social History:   reports that he quit smoking about 5 weeks ago. His smoking use included cigarettes. He has a 22.50 pack-year smoking history. He has never used smokeless tobacco. He reports that he does not drink alcohol and does not use drugs.  No Known Allergies  Family History  Problem Relation Age of Onset  . Diabetes Mother      Prior to Admission medications   Medication Sig Start Date End Date Taking? Authorizing Provider  acetaminophen (TYLENOL) 325 MG tablet Take 2 tablets (650 mg total) by mouth every 4 (four) hours as needed for headache or mild pain. Patient taking differently: Take 650 mg by mouth daily. 09/29/18  Yes Angiulli, Lavon Paganini, PA-C  atorvastatin (LIPITOR) 10 MG tablet Take 1 tablet (10 mg total) by mouth daily at 6 PM. 04/14/20  Yes Gosrani, Nimish C, MD  Cal Carb-Mag Hydrox-Simeth (ROLAIDS ADVANCED) 1000-200-40 MG CHEW Chew 1 tablet by mouth daily as needed (for indigestion).   Yes [provider]  clopidogrel (PLAVIX) 75 MG tablet Take 1 tablet (75 mg total) by mouth daily. 04/14/20  Yes Gosrani, Nimish C, MD  ferrous sulfate 324 MG TBEC Take 324 mg by mouth daily with breakfast.   Yes [provider]  lisinopril (ZESTRIL) 5 MG tablet Take 1 tablet (5 mg total) by mouth daily. 04/14/20  Yes Gosrani, Nimish C, MD  metFORMIN (GLUCOPHAGE) 500 MG tablet Take 1 tablet (500 mg total) by mouth 2 (two) times daily with a meal. 04/14/20  Yes Gosrani, Nimish C, MD  traZODone (DESYREL) 50 MG tablet Take 1 tablet (50 mg total) by mouth at bedtime. 04/14/20  Yes Gosrani, Nimish C, MD  nicotine (NICODERM CQ) 14 mg/24hr patch Place 1 patch (14 mg total) onto the skin daily. Patient not taking: No sig reported 11/30/19   Doree Albee, MD    Physical Exam: Vitals:   06/26/20 2125 06/26/20 2130 06/26/20 2230 06/26/20 2245   BP: (!) 119/45 (!) 112/52 (!) 112/50   Pulse: 87 88 84 78  Resp: 19 18 18 18   Temp:      TempSrc:      SpO2: 100% 100% 96% 99%  Weight:      Height:         Constitutional: NAD, calm  Eyes: PERTLA, lids and conjunctivae normal ENMT: Mucous membranes are moist. Posterior pharynx clear of any exudate or lesions.   Neck: normal, supple, no masses, no thyromegaly Respiratory: clear to auscultation bilaterally, no wheezing, no crackles. No accessory muscle use.  Cardiovascular: S1 & S2 heard, regular rate and rhythm. No extremity edema. No significant JVD. Abdomen: No distension, no tenderness, soft. Bowel sounds active.  Musculoskeletal: no clubbing / cyanosis. No joint deformity upper and lower extremities.   Skin: no significant rashes, lesions, ulcers. Warm, dry, well-perfused. Neurologic: CN 2-12 grossly intact. Sensation intact, DTR normal. Strength 5/5 in all 4 limbs.  Psychiatric: Alert and oriented to person, place, and situation. Pleasant and cooperative.    Labs and  Imaging on Admission: I have personally reviewed following labs and imaging studies  CBC: Recent Labs  Lab 06/26/20 2045  WBC 12.8*  NEUTROABS 10.3*  HGB 7.8*  HCT 27.7*  MCV 76.3*  PLT 160*   Basic Metabolic Panel: Recent Labs  Lab 06/26/20 2047  NA 132*  K 4.0  CL 98  CO2 22  GLUCOSE 157*  BUN 23  CREATININE 1.01  CALCIUM 8.7*  MG 2.1   GFR: Estimated Creatinine Clearance: 61.3 mL/min (by C-G formula based on SCr of 1.01 mg/dL). Liver Function Tests: Recent Labs  Lab 06/26/20 2047  AST 12*  ALT 11  ALKPHOS 74  BILITOT 0.4  PROT 7.5  ALBUMIN 3.3*   No results for input(s): LIPASE, AMYLASE in the last 168 hours. No results for input(s): AMMONIA in the last 168 hours. Coagulation Profile: Recent Labs  Lab 06/26/20 2047  INR 1.1   Cardiac Enzymes: No results for input(s): CKTOTAL, CKMB, CKMBINDEX, TROPONINI in the last 168 hours. BNP (last 3 results) No results for input(s):  PROBNP in the last 8760 hours. HbA1C: No results for input(s): HGBA1C in the last 72 hours. CBG: No results for input(s): GLUCAP in the last 168 hours. Lipid Profile: No results for input(s): CHOL, HDL, LDLCALC, TRIG, CHOLHDL, LDLDIRECT in the last 72 hours. Thyroid Function Tests: Recent Labs    06/26/20 2047  TSH 1.057   Anemia Panel: No results for input(s): VITAMINB12, FOLATE, FERRITIN, TIBC, IRON, RETICCTPCT in the last 72 hours. Urine analysis:    Component Value Date/Time   COLORURINE YELLOW 09/08/2018 1037   APPEARANCEUR CLEAR 09/08/2018 1037   LABSPEC 1.014 09/08/2018 1037   PHURINE 6.0 09/08/2018 1037   GLUCOSEU >=500 (A) 09/08/2018 1037   HGBUR SMALL (A) 09/08/2018 1037   HGBUR trace-intact 10/26/2008 0000   BILIRUBINUR NEGATIVE 09/08/2018 1037   KETONESUR 5 (A) 09/08/2018 1037   PROTEINUR 30 (A) 09/08/2018 1037   UROBILINOGEN 0.2 04/27/2012 1142   NITRITE NEGATIVE 09/08/2018 1037   LEUKOCYTESUR NEGATIVE 09/08/2018 1037   Sepsis Labs: @LABRCNTIP (procalcitonin:4,lacticidven:4) )No results found for this or any previous visit (from the past 240 hour(s)).   Radiological Exams on Admission: CT Head Wo Contrast  Result Date: 06/26/2020 CLINICAL DATA:  Initial evaluation for acute neuro deficit, stroke suspected. EXAM: CT HEAD WITHOUT CONTRAST TECHNIQUE: Contiguous axial images were obtained from the base of the skull through the vertex without intravenous contrast. COMPARISON:  None available. 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 There is an irregular heterogeneous mass positioned at the right occipital cortex measuring 3.5 x 4.3 x 4.2 cm in greatest dimensions, most concerning for a primary CNS neoplasm. Associated hyperdensity suggests a degree of associated hemorrhage. Associated vasogenic edema throughout the posterior right parieto-occipital region with partial effacement of the posterior right lateral ventricle.  No more than trace 2 mm right-to-left shift. Basilar cisterns remain patent. No other acute intracranial hemorrhage or large vessel territory infarct. No extra-axial fluid collection. Vascular: No hyperdense vessel. Scattered vascular calcifications noted within the carotid siphons. Skull: Scalp soft tissues and calvarium within normal limits. Sinuses/Orbits: Globes and orbital soft tissues demonstrate no acute finding. Paranasal sinuses are clear. No mastoid effusion. Other: None. IMPRESSION: 1. 3.5 x 4.3 x 4.2 cm irregular heterogeneous mass positioned at the right occipital cortex. Intrinsic hyperdensity suggests associated hemorrhage. Vasogenic edema throughout the posterior right parieto-occipital region with trace 2 mm right-to-left shift. Differential considerations include a primary CNS neoplasm versus solitary intracranial metastasis. 2. Age-related cerebral  atrophy with chronic microvascular ischemic disease. Critical Value/emergent results were called by telephone at the time of interpretation on 06/26/2020 at 9:10 pm to provider [facility] , who verbally acknowledged these results. Electronically Signed   By: Jeannine Boga M.D.   On: 06/26/2020 21:13   DG Chest Portable 1 View  Result Date: 06/26/2020 CLINICAL DATA:  Fever, chills, weakness, cough EXAM: PORTABLE CHEST 1 VIEW COMPARISON:  05/27/2020 FINDINGS: Consolidation again seen in the right upper lobe compatible with pneumonia. This has improved since prior study. No confluent opacity on the left. Heart is normal size. Aortic atherosclerosis. No effusions. No acute bony abnormality. IMPRESSION: Continued but improving right upper lobe consolidation/pneumonia. Electronically Signed   By: Rolm Baptise M.D.   On: 06/26/2020 20:27    EKG: Independently reviewed. Sinus rhythm, RBBB, LAFB.   Assessment/Plan   1. Brain mass  -  -  -  -  -  -  -  -   2. RUL consolidation; hemoptysis  -  -  -  -  -  -  -   3. Pulmonary  embolism  - Small age-indeterminate bilateral PE noted on CTA one month ago but he was not started on anticoagulation due to hemoptysis  - No hypoxia and no dyspnea at rest; no phlegmasia  - Continue to hold anticoagulation in light of hemorrhagic brain mass   4. Type II DM  - Most recent A1c was 5.6% in January 2022 - Check CBGs and use low-intensity SSI if needed    5. Hypertension  - SBP 90s in ED and lisinopril held on admission    6. PAD  - No acute ischemia  - Continue statin, hold Plavix pending NSG consultation   7. Hyponatremia  - Mild hyponatremia present on admission in setting of hypovolemia  - Hydrate with NS, repeat chem panel in am    DVT prophylaxis: SCDs  Code Status: Full  Level of Care: Level of care: Telemetry Medical Family Communication: Discussed with son at bedside Disposition Plan:  Patient is from: Home  Anticipated d/c is to: TBD Anticipated d/c date is: ~06/30/20 Patient currently: Pending neurosurgical consultation  Consults called: Neurosurgery  Admission status: inpatient     Vianne Bulls, MD [facility]  06/26/2020, 11:52 PM

## 2020-06-26 NOTE — ED Triage Notes (Signed)
Pt to er, pt states that he has chills and feels hot, states that he has generalized weakness, states that all he wants to do is sleep.  States that he has been feeling this way for the past month, states that he also has a decreased appetite.  Son with pt, states that pt has a cough and sometimes has some blood in his cough, states that he hasn't really ate anything in the past few days, states that he is generally weak.

## 2020-06-27 ENCOUNTER — Inpatient Hospital Stay (HOSPITAL_COMMUNITY): Payer: Medicare HMO

## 2020-06-27 ENCOUNTER — Encounter (HOSPITAL_COMMUNITY): Payer: Self-pay | Admitting: Family Medicine

## 2020-06-27 DIAGNOSIS — I2699 Other pulmonary embolism without acute cor pulmonale: Secondary | ICD-10-CM | POA: Diagnosis present

## 2020-06-27 DIAGNOSIS — I10 Essential (primary) hypertension: Secondary | ICD-10-CM

## 2020-06-27 DIAGNOSIS — E119 Type 2 diabetes mellitus without complications: Secondary | ICD-10-CM

## 2020-06-27 DIAGNOSIS — G9389 Other specified disorders of brain: Secondary | ICD-10-CM | POA: Diagnosis not present

## 2020-06-27 DIAGNOSIS — J984 Other disorders of lung: Secondary | ICD-10-CM | POA: Diagnosis not present

## 2020-06-27 DIAGNOSIS — Z86711 Personal history of pulmonary embolism: Secondary | ICD-10-CM | POA: Diagnosis present

## 2020-06-27 DIAGNOSIS — I619 Nontraumatic intracerebral hemorrhage, unspecified: Secondary | ICD-10-CM

## 2020-06-27 DIAGNOSIS — C719 Malignant neoplasm of brain, unspecified: Secondary | ICD-10-CM

## 2020-06-27 LAB — STREP PNEUMONIAE URINARY ANTIGEN: Strep Pneumo Urinary Antigen: NEGATIVE

## 2020-06-27 LAB — CBC
HCT: 24.7 % — ABNORMAL LOW (ref 39.0–52.0)
Hemoglobin: 7 g/dL — ABNORMAL LOW (ref 13.0–17.0)
MCH: 21.3 pg — ABNORMAL LOW (ref 26.0–34.0)
MCHC: 28.3 g/dL — ABNORMAL LOW (ref 30.0–36.0)
MCV: 75.1 fL — ABNORMAL LOW (ref 80.0–100.0)
Platelets: 666 10*3/uL — ABNORMAL HIGH (ref 150–400)
RBC: 3.29 MIL/uL — ABNORMAL LOW (ref 4.22–5.81)
RDW: 18.4 % — ABNORMAL HIGH (ref 11.5–15.5)
WBC: 12.4 10*3/uL — ABNORMAL HIGH (ref 4.0–10.5)
nRBC: 0.2 % (ref 0.0–0.2)

## 2020-06-27 LAB — HEMOGLOBIN A1C
Hgb A1c MFr Bld: 6.4 % — ABNORMAL HIGH (ref 4.8–5.6)
Mean Plasma Glucose: 136.98 mg/dL

## 2020-06-27 LAB — MRSA PCR SCREENING: MRSA by PCR: NEGATIVE

## 2020-06-27 LAB — CBG MONITORING, ED
Glucose-Capillary: 189 mg/dL — ABNORMAL HIGH (ref 70–99)
Glucose-Capillary: 234 mg/dL — ABNORMAL HIGH (ref 70–99)

## 2020-06-27 LAB — GLUCOSE, CAPILLARY
Glucose-Capillary: 209 mg/dL — ABNORMAL HIGH (ref 70–99)
Glucose-Capillary: 210 mg/dL — ABNORMAL HIGH (ref 70–99)

## 2020-06-27 LAB — RESP PANEL BY RT-PCR (FLU A&B, COVID) ARPGX2
Influenza A by PCR: NEGATIVE
Influenza B by PCR: NEGATIVE
SARS Coronavirus 2 by RT PCR: NEGATIVE

## 2020-06-27 LAB — URINALYSIS, ROUTINE W REFLEX MICROSCOPIC
Bilirubin Urine: NEGATIVE
Glucose, UA: 50 mg/dL — AB
Hgb urine dipstick: NEGATIVE
Ketones, ur: 5 mg/dL — AB
Leukocytes,Ua: NEGATIVE
Nitrite: NEGATIVE
Protein, ur: 100 mg/dL — AB
Specific Gravity, Urine: 1.016 (ref 1.005–1.030)
pH: 5 (ref 5.0–8.0)

## 2020-06-27 LAB — BASIC METABOLIC PANEL
Anion gap: 9 (ref 5–15)
BUN: 26 mg/dL — ABNORMAL HIGH (ref 8–23)
CO2: 22 mmol/L (ref 22–32)
Calcium: 8.4 mg/dL — ABNORMAL LOW (ref 8.9–10.3)
Chloride: 102 mmol/L (ref 98–111)
Creatinine, Ser: 0.9 mg/dL (ref 0.61–1.24)
GFR, Estimated: >60 mL/min (ref >60)
Glucose, Bld: 166 mg/dL — ABNORMAL HIGH (ref 70–99)
Potassium: 4.4 mmol/L (ref 3.5–5.1)
Sodium: 133 mmol/L — ABNORMAL LOW (ref 135–145)

## 2020-06-27 LAB — PROCALCITONIN
Procalcitonin: <0.1 ng/mL
Procalcitonin: <0.1 ng/mL

## 2020-06-27 LAB — HIV ANTIBODY (ROUTINE TESTING W REFLEX): HIV Screen 4th Generation wRfx: NONREACTIVE

## 2020-06-27 IMAGING — CT CT CHEST-ABD-PELV W/ CM
3 of 7 series · 10 of 46 positions shown, 11 images · IV contrast (omnipaque)
Comparison: CT angiography [DATE], CT abdomen and pelvis
[DATE]

CLINICAL DATA: Brain lesion, assess for metastatic disease

EXAM:
CT CHEST, ABDOMEN, AND PELVIS WITH CONTRAST
TECHNIQUE: Multidetector CT imaging of the chest, abdomen and pelvis was
performed following the standard protocol during bolus
administration of intravenous contrast.
CONTRAST:  100mL OMNIPAQUE IOHEXOL 300 MG/ML  SOLN

[Series 3: cap with · axial · 0.77mm/px · z∈[-456,-66]mm · 4 of 131 slices shown, 5 images (1 of 2)]
[im 27/131  soft-tissue]
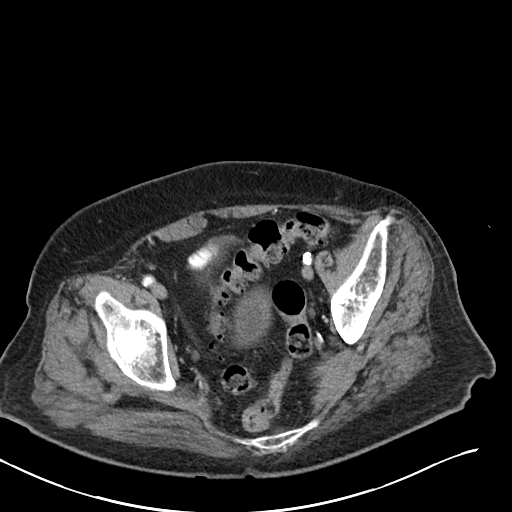
[im 27/131  bone]
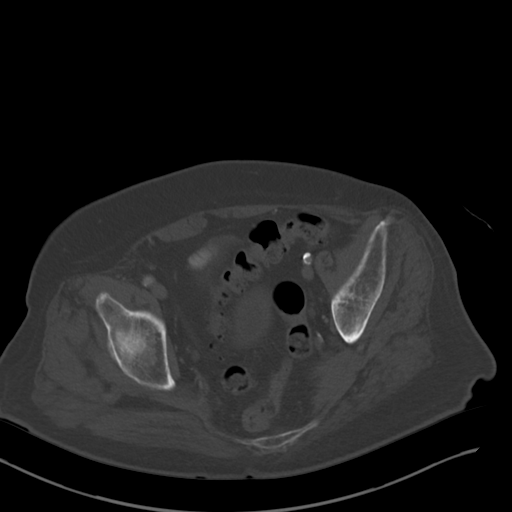
[im 53/131  soft-tissue]
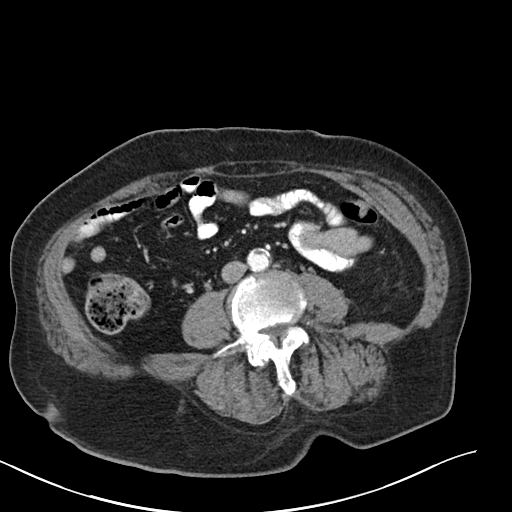
[im 79/131  soft-tissue]
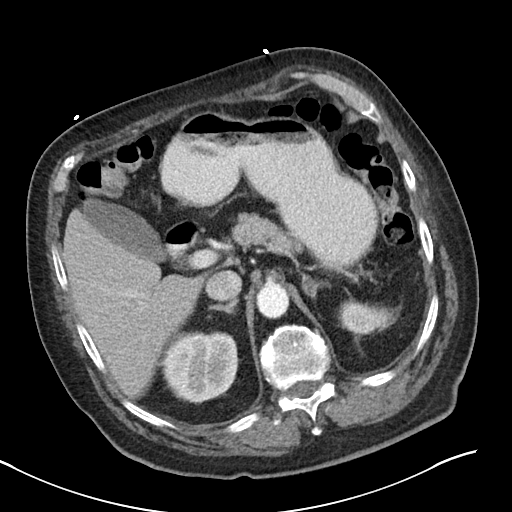
[im 105/131  soft-tissue]
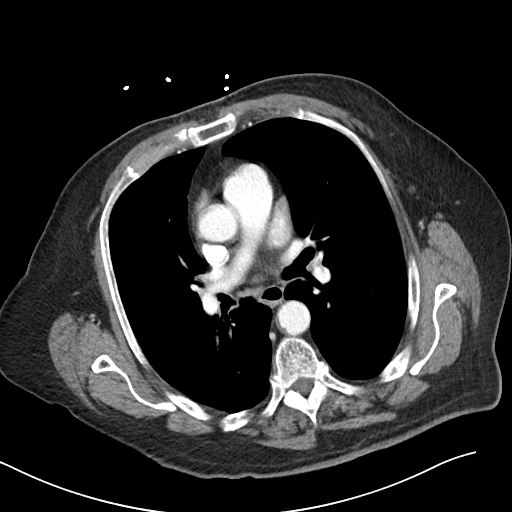

[Series 6: cor · coronal · 0.80mm/px · 3 of 108 slices shown]
[im 36/108  soft-tissue]
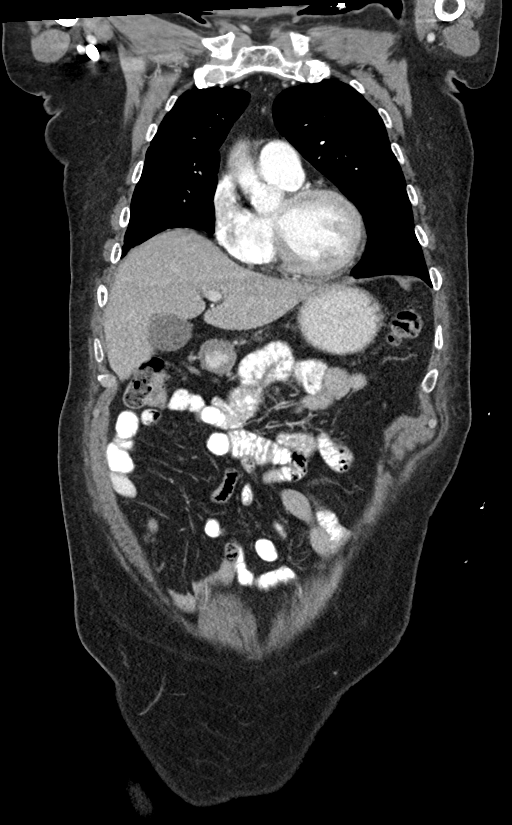
[im 48/108  soft-tissue]
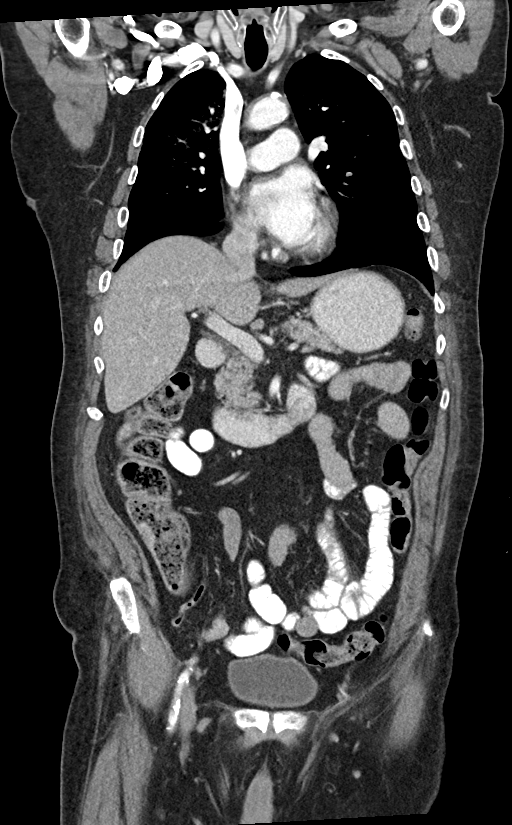
[im 60/108  soft-tissue]
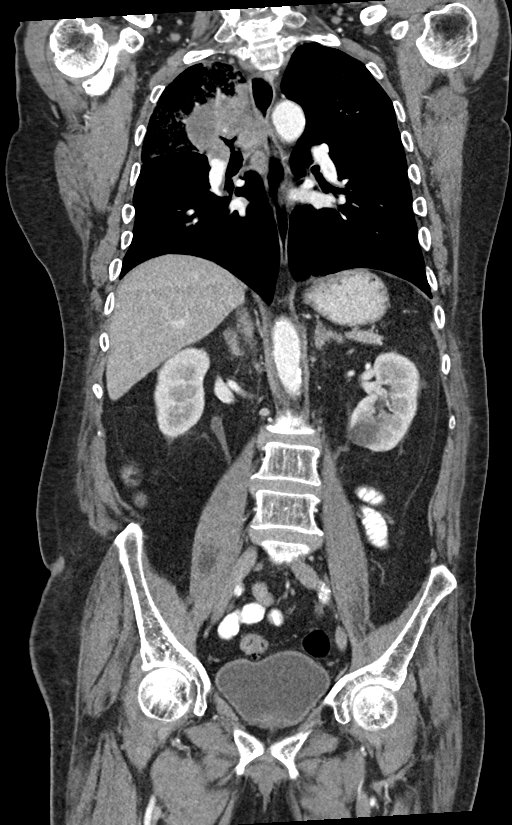

[Series 10: cap with · axial · 0.63mm/px · z∈[-447,-183]mm · 3 of 133 slices shown (2 of 2)]
[im 27/133  soft-tissue]
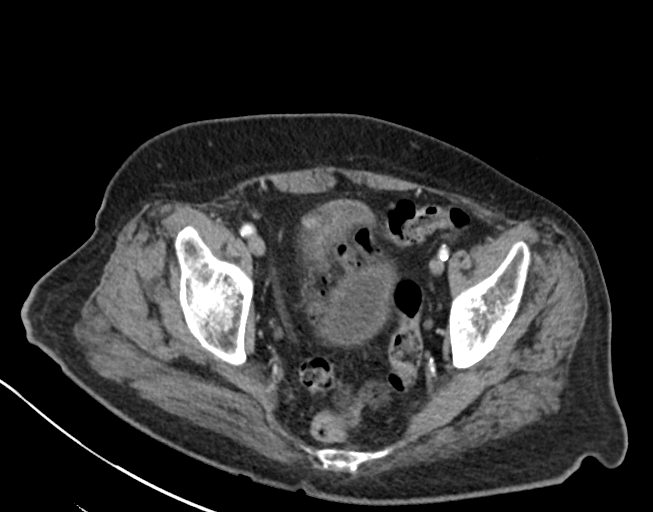
[im 53/133  soft-tissue]
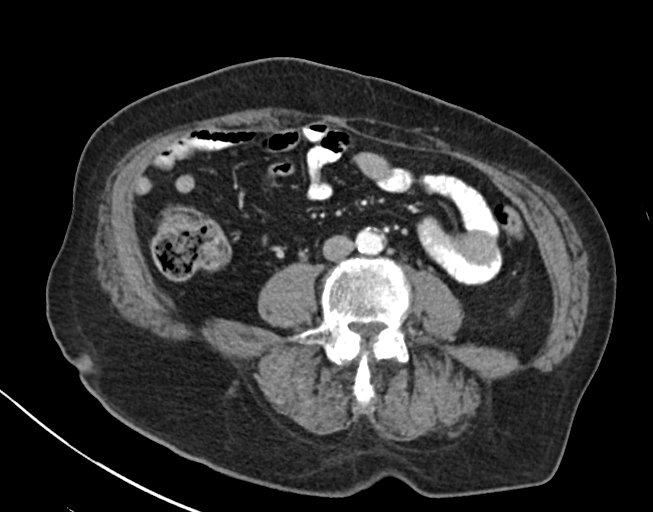
[im 80/133  soft-tissue]
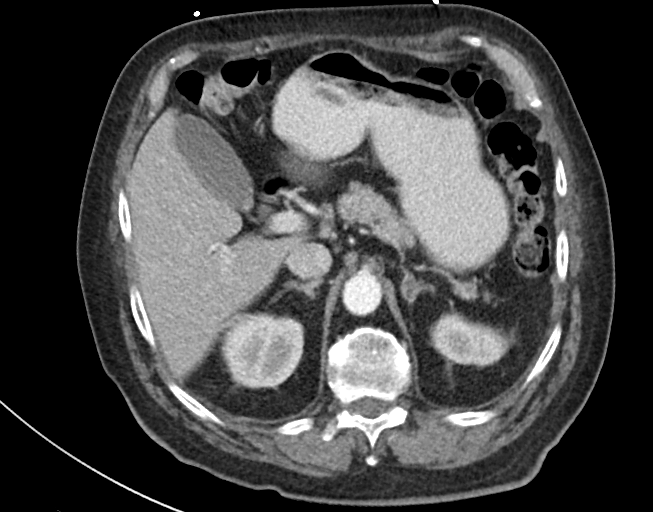

[10 of 46 positions shown; findings below may reference images not displayed]

FINDINGS: CT CHEST FINDINGS

Cardiovascular: Normal heart size. No pericardial effusion. Coronary
artery calcifications are present. The aortic root is suboptimally
assessed given cardiac pulsation artifact. Atherosclerotic plaque
within the normal caliber aorta and proximal great vessels. No acute
luminal abnormality of the imaged aorta. No periaortic stranding or
hemorrhage. Normal 3 vessel branching of the aortic arch. Proximal
great vessels are free of acute abnormality. Central pulmonary
arteries are normal caliber. Linear hypodense filling defect in the
distal left main pulmonary artery at the bifurcation of the lingular
and left lower lobar arteries ([DATE]) several additional smaller
possible filling defects are seen pulmonary arteries of the left
lower lobe (3/34) and right lower lobe ([DATE]). Truncation of the
right posterior segmental pulmonary artery as it enters the region
of the heterogeneous attenuating masslike opacity in the right upper
lobe. Additional narrowing of the adjacent pulmonary veins and hilar
airways as well. Central pulmonary arteries remain normal caliber.
RV/LV ratio is maintained.

Mediastinum/Nodes: Mass within the right upper with extension along
the right superior mediastinal margin extending into the right hilum
and subcarinal space. There is associated narrowing of the superior
hilar airways and vessels. A right superior hilar deposit measures
up to 12 mm in size, previously 9 mm ([DATE]) increasing subcarinal
extension as well narrowing the right mainstem bronchus with a
subcarinal deposit measures approximately 2.3 cm on today's exam,
previously 2.1 cm. No contralateral hilar adenopathy. The right
lateral margin of the midthoracic esophagus is difficult to separate
from this soft tissue mass.

Lungs/Pleura: Large area of masslike opacity in the right upper lobe
with extension along the right superior mediastinal margin and into
the hilum and subcarinal space. This measures approximately 6.4 x 8
cm, previously 5.2 x 6.3 cm at similar levels. There is decreasing
central cavitation now with a slightly more hypoattenuating necrotic
appearance on today's exam. Associated narrowing of the hilar
airways and vessels including some occlusion of the upper lobe
airways centrally. Surrounding ground-glass and consolidative
opacity may reflect some postobstructive pneumonia change. Some
additional more patchy ground-glass opacities in the right upper
lobe periphery are similar to prior. Stable 4 mm subpleural nodules
seen in the lateral and posterior right lower lobe. Remaining
portions of the lungs are clear. No pneumothorax or pleural
effusion. Minimal atelectasis.

Musculoskeletal: No worrisome chest wall mass or lesion. No direct
extension into the chest wall or adjacent osseous structures by the
upper lobe mass. Multilevel degenerative changes are present in the
imaged portions of the spine. Exaggerated thoracic kyphosis with
some degenerative vertebral body height loss and multilevel
Schmorl's node formations. No acute osseous abnormality or
suspicious osseous lesion.

CT ABDOMEN PELVIS FINDINGS

Hepatobiliary: No worrisome focal liver lesions. Smooth liver
surface contour. Normal hepatic attenuation. Normal gallbladder and
biliary tree.

Pancreas: No pancreatic ductal dilatation or surrounding
inflammatory changes.

Spleen: Postsurgical changes related to prior splenectomy with
rounded soft tissue density in left subdiaphragmatic space possibly
reflecting splenosis measuring 3.2 x 2.8 cm, unchanged from prior.

Adrenals/Urinary Tract: Hypoattenuating nodule in the medial limb of
the right adrenal gland again measures up to 1 cm in size, unchanged
since [LY] and strongly favoring a benign process such as adenoma
rather than metastatic lesion. No concerning right adrenal lesion.

Stable appearance of the multiple fluid attenuation cyst in a
slightly more intermediate attenuation exophytic cyst arising from
the lower pole left kidney. Mild symmetric bilateral perinephric
stranding, a nonspecific finding which may correlate with advanced
age or decreased renal function. No concerning renal mass,
obstructive urolith or hydronephrosis. Multiple coarse
calcifications layering dependently within the bladder may reflect
bladder calculi though these were not present on comparison imaging.
There is however some chronic indentation of the urinary bladder by
an enlarged, heterogeneous prostate gland.

Stomach/Bowel: Distal esophagus, stomach and duodenal sweep are
unremarkable. No small bowel wall thickening or dilatation. No
evidence of obstruction. A normal appendix is visualized. No colonic
dilatation or wall thickening.

Vascular/Lymphatic: Atherosclerotic calcifications within the
abdominal aorta and branch vessels. No aneurysm or ectasia. No
enlarged abdominopelvic lymph nodes.

Reproductive: Heterogeneous, enlarged prostate with few typically
benign prostate calcifications. Indentation of the bladder base.

Other: No abdominopelvic free fluid or free gas. No bowel containing
hernias.

Musculoskeletal: Multilevel degenerative changes are present in the
imaged portions of the spine. Additional degenerative changes in the
hips and pelvis. The osseous structures appear diffusely
demineralized which may limit detection of small or nondisplaced
fractures. No acute osseous abnormality or suspicious osseous
lesion.
IMPRESSION: 1. Large area of masslike opacity in the right upper lobe with
extension along the right superior mediastinal margin and into the
right hilum and subcarinal space. This measures approximately 6.4 x
8 cm, previously 5.2 x 6.3 cm at similar levels. There is decreasing
central cavitation now with a slightly more hypoattenuating necrotic
appearance on today's exam. Findings are concerning for primary lung
malignancy, particularly given the findings in the brain.
Necrotic/atypical infection is less favored. Consider further
evaluation with tissue sampling potentially via bronchoscopy.
2. Increasing subcarinal and right hilar extension with narrowing of
the superior hilar airways and vessels including some occlusion of
the right upper lobe airways. The right lateral margin of the
midthoracic esophagus is difficult to separate from this soft tissue
mass.
3. Some more peripheral areas of patchy consolidation interstitial
opacity throughout the right upper lobe likely reflects a
postobstructive process
4. Linear hypodense filling defect in the distal left main pulmonary
artery at the bifurcation of the lingular and left lower lobar
arteries, several additional smaller possible filling defects are
seen pulmonary arteries of the left lower lobe and right lower lobe
(3/34) and right lower lobe ([DATE]). Findings are concerning for
pulmonary emboli. No CT evidence of right heart strain within
limitations of this exam.
5. Multiple coarse calcifications layering dependently within the
bladder may reflect bladder calculi though these were not present on
comparison imaging. No obstructive urolithiasis or hydronephrosis.
6. Heterogeneous, enlarged prostate gland with some chronic
indentation of the urinary bladder base. Could correlate for
features of outlet obstruction with urinalysis were clinically
appropriate. Furthermore recommend assessment with prostate exam and
PSA as warranted.
7. No other convincing evidence of primary malignancy or metastatic
disease within the abdomen or pelvis.

## 2020-06-27 MED ORDER — LEVETIRACETAM 500 MG PO TABS
500.0000 mg | ORAL_TABLET | Freq: Two times a day (BID) | ORAL | Status: DC
  Administered 2020-06-27 – 2020-07-14 (×33): 500 mg via ORAL
  Filled 2020-06-27 (×33): qty 1

## 2020-06-27 MED ORDER — IOHEXOL 9 MG/ML PO SOLN
ORAL | Status: AC
  Administered 2020-06-27: 500 mL
  Filled 2020-06-27: qty 1000

## 2020-06-27 MED ORDER — IOHEXOL 300 MG/ML  SOLN
100.0000 mL | Freq: Once | INTRAMUSCULAR | Status: AC | PRN
  Administered 2020-06-27: 100 mL via INTRAVENOUS

## 2020-06-27 NOTE — Consult Note (Signed)
NAME:  Thomas Garcia, MRN:  [id number], DOB:  July 05, 1940, LOS: 1 ADMISSION DATE:  06/26/2020, CONSULTATION DATE:  06/27/2020 REFERRING MD:  Dr. Myna Hidalgo, CHIEF COMPLAINT:  Lung mass    History of Present Illness:  Thomas Garcia is a 80 y.o. 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 Of note patient was seen at this health system 1 month ago for cough and hemoptysis and was found to have small bilateral age-indeterminate PE's and a right upper lobe mass but unfortunately patient refused admission.   Since that ED visit patient has experienced worsened  generalized weakness, fatigue, nausea, hemoptysis, and loss of appetite. He reports a ~80lb weight loss over the span of a year. Additional he reports chills, vision changes, progressive weakness, and cough with hemoptysis. He reports a 80ppd smoking history.   Given lung mass with high suspicion for metastatic cancer PCCM was consulted for assistance in tissue sampling.    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 Admitted 3/21  Interim History / Subjective:  Sitting up in bed with continued report of generalized weakness   Objective   Blood pressure (!) 116/52, pulse 85, temperature 98.4 F (36.9 C), resp. rate 17, height 5\' 9"  (1.753 m), weight 81.6 kg, SpO2 100 %.        Intake/Output Summary (Last 24 hours) at 06/27/2020 1816 Last data filed at 06/27/2020 1207 Gross per 24 hour  Intake 1000 ml  Output -  Net 1000 ml   Filed Weights   06/26/20 1856  Weight: 81.6 kg    Examination: General: Chronically ill appearing mildly deconditioned elderly male lying in bed in NAD HEENT: [place]/AT, MM pink/moist, PERRL,  Neuro: Alert and oriented, non-focal, no drift, bilateral grip even  CV: s1s2 regular rate and  rhythm, no murmur, rubs, or gallops,  PULM:  No increased work of breathing, saturations appropriate on RA, auscultation of lungs performed by MD  GI: soft, bowel sounds active in all 4 quadrants, non-tender, non-distended Extremities: warm/dry, no edema, left BKA  Skin: no rashes or lesions  Labs/imaging that I havepersonally reviewed    Head CT 3/20 > 3.5 x 4.3 x 4.2 mass in the right occipital cortex with associated likely hemorrhage and vasogenic edema  Resolved Hospital Problem list     Assessment & Plan:  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 Brain mass with associated intrinsic hemorrhage and vasogenic edema  P: Hold Plavix for at least 5 days  Obtains CT chest/ABD/Pelvis  Encourage pulmonary hygiene  Monitor for worsening hemoptysis  Very low suspicion for TB will cancel AFB and precaution  Biopsy plan pending CT results   Best practice (evaluated daily)  Diet:  Oral Pain/Anxiety/Delirium protocol (if indicated): No VAP protocol (if indicated): Not indicated DVT prophylaxis: Contraindicated GI prophylaxis: N/A Glucose control:  SSI Yes Central venous access:  N/A Arterial line:  N/A Foley:  N/A Mobility:  OOB  PT consulted: Yes Last date of multidisciplinary goals of care discussion N/A Code Status:  full code Disposition: Progressive   Labs   CBC: Recent Labs  Lab 06/26/20 2045 06/27/20 0529  WBC 12.8* 12.4*  NEUTROABS 10.3*  --   HGB 7.8* 7.0*  HCT 27.7* 24.7*  MCV 76.3* 75.1*  PLT 745* 666*  Basic Metabolic Panel: Recent Labs  Lab 06/26/20 2047 06/27/20 0529  NA 132* 133*  K 4.0 4.4  CL 98 102  CO2 22 22  GLUCOSE 157* 166*  BUN 23 26*  CREATININE 1.01 0.90  CALCIUM 8.7* 8.4*  MG 2.1  --    GFR: Estimated Creatinine Clearance: 68.7 mL/min (by C-G formula based on SCr of 0.9 mg/dL). Recent Labs  Lab 06/26/20 2045 06/26/20 2047 06/27/20 0529  PROCALCITON  --  <0.10 <0.10  WBC 12.8*  --  12.4*    Liver  Function Tests: Recent Labs  Lab 06/26/20 2047  AST 12*  ALT 11  ALKPHOS 74  BILITOT 0.4  PROT 7.5  ALBUMIN 3.3*   No results for input(s): LIPASE, AMYLASE in the last 168 hours. No results for input(s): AMMONIA in the last 168 hours.  ABG    Component Value Date/Time   TCO2 26 09/17/2018 1048     Coagulation Profile: Recent Labs  Lab 06/26/20 2047  INR 1.1    Cardiac Enzymes: No results for input(s): CKTOTAL, CKMB, CKMBINDEX, TROPONINI in the last 168 hours.  HbA1C: Hgb A1c MFr Bld  Date/Time Value Ref Range Status  06/27/2020 05:29 AM 6.4 (H) 4.8 - 5.6 % Final    Comment:    (NOTE) Pre diabetes:          5.7%-6.4%  Diabetes:              >6.4%  Glycemic control for   <7.0% adults with diabetes   04/14/2020 12:00 AM 5.6 <5.7 % of total Hgb Final    Comment:    For the purpose of screening for the presence of diabetes: . <5.7%       Consistent with the absence of diabetes 5.7-6.4%    Consistent with increased risk for diabetes             (prediabetes) > or =6.5%  Consistent with diabetes . This assay result is consistent with a decreased risk of diabetes. . Currently, no consensus exists regarding use of hemoglobin A1c for diagnosis of diabetes in children. . According to American Diabetes Association (ADA) guidelines, hemoglobin A1c <7.0% represents optimal control in non-pregnant diabetic patients. Different metrics may apply to specific patient populations.  Standards of Medical Care in Diabetes(ADA). .     CBG: Recent Labs  Lab 06/27/20 0755 06/27/20 1207 06/27/20 1552  GLUCAP 189* 234* 209*    Review of Systems: Positive in bold   Gen: Denies fever, chills, weight change, fatigue, night sweats HEENT: Denies blurred vision, double vision, hearing loss, tinnitus, sinus congestion, rhinorrhea, sore throat, neck stiffness, dysphagia, visual hallucinations  PULM: Denies shortness of breath, cough, sputum production, hemoptysis,  wheezing CV: Denies chest pain, edema, orthopnea, paroxysmal nocturnal dyspnea, palpitations GI: Denies abdominal pain, nausea, vomiting, diarrhea, hematochezia, melena, constipation, change in bowel habits GU: Denies dysuria, hematuria, polyuria, oliguria, urethral discharge Endocrine: Denies hot or cold intolerance, polyuria, polyphagia or appetite change Derm: Denies rash, dry skin, scaling or peeling skin change Heme: Denies easy bruising, bleeding, bleeding gums Neuro: Denies headache, numbness, weakness, slurred speech, loss of memory or consciousness  Past Medical History:  He,  has a past medical history of Diabetes mellitus without complication ([place]), Kidney stones, and PONV (postoperative nausea and vomiting).   Surgical History:   Past Surgical History:  Procedure Laterality Date  . ABDOMINAL AORTOGRAM W/LOWER EXTREMITY N/A 09/17/2018   Procedure: ABDOMINAL AORTOGRAM W/LOWER EXTREMITY;  Surgeon: Marty Heck, MD;  Location:  [facility];  Service: Cardiovascular;  Laterality: N/A;  . AMPUTATION Left 09/22/2018   Procedure: AMPUTATION BELOW KNEE LEFT;  Surgeon: Marty Heck, MD;  Location: [facility];  Service: Vascular;  Laterality: Left;  . AMPUTATION TOE Left 09/10/2018   Procedure: AMPUTATION OF THIRD TOE LEFT FOOT, DEBRIDEMENT OF ULCERS ON SECOND TOE AND PARTIAL AMPUTATION SECOND TOE ON LEFT FOOT;  Surgeon: Virl Cagey, MD;  Location: [facility];  Service: General;  Laterality: Left;  . APPLICATION OF WOUND VAC Left 09/18/2018   Procedure: APPLICATION OF WOUND VAC;  Surgeon: Marty Heck, MD;  Location: [facility];  Service: Vascular;  Laterality: Left;  . CYSTOSCOPY  05/08/2012   Procedure: CYSTOSCOPY;  Surgeon: Marissa Nestle, MD;  Location: [facility];  Service: Urology;  Laterality: N/A;  Evacuation of clots  . KIDNEY SURGERY     sutures   . PERIPHERAL VASCULAR BALLOON ANGIOPLASTY  09/17/2018   Procedure: PERIPHERAL VASCULAR BALLOON ANGIOPLASTY;   Surgeon: Marty Heck, MD;  Location: [facility];  Service: Cardiovascular;;  LT SFA and AT  . SPLENECTOMY    . TRANSMETATARSAL AMPUTATION Left 09/18/2018   Procedure: TRANSMETATARSAL AMPUTATION LEFT FOOT;  Surgeon: Marty Heck, MD;  Location: [facility];  Service: Vascular;  Laterality: Left;  . TRANSURETHRAL RESECTION OF PROSTATE  05/14/2012   Procedure: TRANSURETHRAL RESECTION OF THE PROSTATE (TURP);  Surgeon: Marissa Nestle, MD;  Location: [facility];  Service: Urology;  Laterality: N/A;     Social History:   reports that he quit smoking about 5 weeks ago. His smoking use included cigarettes. He has a 22.50 pack-year smoking history. He has never used smokeless tobacco. He reports that he does not drink alcohol and does not use drugs.   Family History:  His family history includes Diabetes in his mother.   Allergies No Known Allergies   Home Medications  Prior to Admission medications   Medication Sig Start Date End Date Taking? Authorizing Provider  acetaminophen (TYLENOL) 325 MG tablet Take 2 tablets (650 mg total) by mouth every 4 (four) hours as needed for headache or mild pain. Patient taking differently: Take 650 mg by mouth daily. 09/29/18  Yes Angiulli, Lavon Paganini, PA-C  atorvastatin (LIPITOR) 10 MG tablet Take 1 tablet (10 mg total) by mouth daily at 6 PM. 04/14/20  Yes Gosrani, Nimish C, MD  Cal Carb-Mag Hydrox-Simeth (ROLAIDS ADVANCED) 1000-200-40 MG CHEW Chew 1 tablet by mouth daily as needed (for indigestion).   Yes [provider]  clopidogrel (PLAVIX) 75 MG tablet Take 1 tablet (75 mg total) by mouth daily. 04/14/20  Yes Gosrani, Nimish C, MD  ferrous sulfate 324 MG TBEC Take 324 mg by mouth daily with breakfast.   Yes [provider]  lisinopril (ZESTRIL) 5 MG tablet Take 1 tablet (5 mg total) by mouth daily. 04/14/20  Yes Gosrani, Nimish C, MD  metFORMIN (GLUCOPHAGE) 500 MG tablet Take 1 tablet (500 mg total) by mouth 2 (two) times daily with a  meal. 04/14/20  Yes Gosrani, Nimish C, MD  traZODone (DESYREL) 50 MG tablet Take 1 tablet (50 mg total) by mouth at bedtime. 04/14/20  Yes Gosrani, Nimish C, MD  nicotine (NICODERM CQ) 14 mg/24hr patch Place 1 patch (14 mg total) onto the skin daily. Patient not taking: No sig reported 11/30/19   Doree Albee, MD     Signature:  Johnsie Cancel, NP-C [facility] Personal contact information can be  found on Amion  If no response please page: Adult pulmonary and critical care medicine pager on Amion unitl 7pm After 7pm please call [phone] 06/27/2020, 6:37 PM

## 2020-06-27 NOTE — Consult Note (Signed)
   Providing Compassionate, Quality Care - Together  Neurosurgery Consult  Referring physician: Dr. 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 He states its been ongoing for 1 month and he also has a productive cough. He normally lives with his brother and his brother helps take care of him.  He normally uses a wheelchair due to his left lower extremity amputation.  He has a history of hypertension, diabetes, peripheral arterial disease.  He is somewhat of a poor historian therefore some of the history is per the chart.  History reviewed. No pertinent past medical history. History reviewed. No pertinent surgical history.  Medications: I have reviewed the patient's current medications. Allergies: No Known Allergies  History reviewed. No pertinent family history. Social History:  has no history on file for tobacco use, alcohol use, and drug use.  ROS: Unable to obtain  Physical Exam:  Vital signs in last 24 hours: Temp:  [98 F (36.7 C)-98.3 F (36.8 C)] 98 F (36.7 C) (07/25 1814) Pulse Rate:  [58-128] 65 (07/26 0746) Resp:  [11-18] 14 (07/26 0217) BP: (138-182)/(65-125) 153/88 (07/26 0700) SpO2:  [91 %-98 %] 96 % (07/26 0746) PE: Awake alert oriented x3 Poor dentition No acute distress Face symmetric PERRLA EOMI Left-sided neglect Left hemianopsia No drift Bilateral upper extremities 4+/5 Bilateral lower extremities 4+5 (left BKA) Sensory intact light touch   Impression/Assessment:  80 year old male with  1.  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  There is some concern of possible TB, he is currently in airborne precautions and work-up has been sent. -Hold Plavix, his last dose supposedly was 06/26/2020.  There is potential he may need craniotomy during this admission pending further imaging work-up.   Thank you for allowing me to participate in this patient's care.  Please do not hesitate to call with questions or concerns.   Elwin Sleight, [facility] Cell: [phone]

## 2020-06-27 NOTE — Progress Notes (Signed)
Called son at home phone (landline [phone], cell [phone]) to discuss plan.  Erskine Emery MD PCCM

## 2020-06-27 NOTE — Progress Notes (Signed)
PROGRESS NOTE    Thomas Garcia  JJO:[id number] DOB: Mar 19, 1941 DOA: 06/26/2020 PCP: Doree Albee, MD   Chief Complaint  Patient presents with  . Generalized Body Aches    Brief admission Narrative:  As per H&P written by Dr. Myna Hidalgo on 06/26/20 Thomas Garcia is a 80 y.o. male with medical history significant for hypertension, type 2 diabetes mellitus, and peripheral arterial disease, now presenting to the emergency department for evaluation of general weakness, fatigue, nausea, loss of appetite, and cough with scant hemoptysis.  Patient was seen in the emergency department 1 month ago complaining of shortness of breath and hemoptysis, was found to have small bilateral PE that were age-indeterminate as well as a masslike consolidation involving the right upper lobe with some cavitation, and was recommended for admission to the hospital but refused at that time and went home.  Since then, he has had progressive general weakness, fatigue, nausea, progressive loss of appetite, and seems to be confused at times per report of his son at the bedside.  He continues to have cough and shortness of breath and continues to have some blood-streaked sputum.  He has chills intermittently but has not taken his temperature.  Resting tremor seems to be worsening per report of his son.  Patient reports some headaches but denies focal numbness or weakness.  Reports that he recently quit smoking. He uses a wheelchair at baseline.   ED Course: Upon arrival to the ED, patient is found to be afebrile, saturating mid 90s on room air, and with stable blood pressure in the 90s to low 160F systolic.  EKG with sinus rhythm, RBBB, and LAFB.  Chest x-ray with improving right upper lobe consolidation.  Head CT concerning for a mass at the right occipital cortex with associated hemorrhage and vasogenic edema with 2 mm right to left shift.  Chemistry panel features a glucose 157 and sodium 132.  CBC with increased leukocytosis,  stable microcytic anemia, and increasing thrombocytosis.  Neurosurgery was consulted by the ED physician and recommends medical admission to [place]: 1-Brain mass -New diagnosis -Patient with mass involving right occipital cortex with hemorrhagic, vasogenic edema and 2 mm right-to-left shift. -Patient started on Decadron, holding Plavix and heparin products -Neurosurgery (Dr. Reatha Armour, [facility]) has recommended transfer to [facility] for further evaluation and management.  2-RUL consolidation/cavitary lesion and hemoptysis  -with concerns for malignancy VS infectious. -no requiring oxygen supplementaiton -started on antibiotics and pulmonary service consulted for further recommendations; patient might need bronchoscopy. (Discussed with Dr. Valeta Harms and they will see him when patient arrived to [facility]) -continue empirical antibiotic therapy -follow response.  3-PAD -status post left AKA -continue statins  4-Essential hypertension -BP is soft but stable -follow VS -holding lisinopril  5-Diabetes mellitus type 2 in nonobese (HCC) -continue SSI and follow CBG fluctuation while using steroids.  6-hyponatremia -in the setting of hypovolemia and decrease oral intake. -there might be a component of SIADH with lungs findings. -judicious IVF's will be provided  -follow sodium level trend   7-Pulmonary embolism (HCC) -seen on CTA approx a month ago. -patient denies hypoxia  -holding anticoagulation in the setting of brain mass, with hemorrhagic changes and potential need for biopsy.  DVT prophylaxis: SCD's Code Status: Full Family Communication: no family at bedside Disposition:   Status is: Inpatient  Dispo: The patient is from: Home              Anticipated d/c is to: to be determine  Patient currently no medically stable for discharge.  Will be transferred to [facility] for neurosurgery evaluation and treatment.  Also patient will be seen by  pulmonologist to determine the need of bronchoscopy.  Continue empiric antibiotics and the use of IV steroids for brain edema.   Difficult to place patient no    Consultants:   Neurosurgery  Pulmonary service   Procedures:  See below for x-ray reports   Antimicrobials:  Rocephin and zithromax.  Subjective: No fever, no chest pain, no nausea or vomiting.  Reports general malaise and intermittent hemoptysis episodes.  Objective: Vitals:   06/27/20 0500 06/27/20 0530 06/27/20 0600 06/27/20 0625  BP: (!) 121/45 (!) 127/41 (!) 117/41 (!) 109/54  Pulse: 83 89 85 (!) 101  Resp:   18 17  Temp:      TempSrc:      SpO2: 94% 99% 97% 92%  Weight:      Height:       No intake or output data in the 24 hours ending 06/27/20 0814 Filed Weights   06/26/20 1856  Weight: 81.6 kg    Examination:  General exam: No fever, no requiring oxygen supplementation.  Denies chest pain, nausea or vomiting.  Chronically ill in appearance and expressing decreased appetite. Respiratory system: Good air movement bilaterally; no using accessory muscles.  Good saturation on room air. Cardiovascular system: S1 & S2 heard, RRR. No JVD, murmurs, rubs, gallops or clicks. No pedal edema. Gastrointestinal system: Abdomen is nondistended, soft and nontender. No organomegaly or masses felt. Normal bowel sounds heard. Central nervous system: Alert and oriented. No focal neurological deficits. Extremities: No cyanosis or clubbing.  Left AKA appreciated on exam. Skin: No petechiae. Psychiatry: Judgement and insight appear normal. Mood & affect appropriate.    Data Reviewed: I have personally reviewed following labs and imaging studies  CBC: Recent Labs  Lab 06/26/20 2045 06/27/20 0529  WBC 12.8* 12.4*  NEUTROABS 10.3*  --   HGB 7.8* 7.0*  HCT 27.7* 24.7*  MCV 76.3* 75.1*  PLT 745* 666*    Basic Metabolic Panel: Recent Labs  Lab 06/26/20 2047 06/27/20 0529  NA 132* 133*  K 4.0 4.4  CL 98 102   CO2 22 22  GLUCOSE 157* 166*  BUN 23 26*  CREATININE 1.01 0.90  CALCIUM 8.7* 8.4*  MG 2.1  --     GFR: Estimated Creatinine Clearance: 68.7 mL/min (by C-G formula based on SCr of 0.9 mg/dL).  Liver Function Tests: Recent Labs  Lab 06/26/20 2047  AST 12*  ALT 11  ALKPHOS 74  BILITOT 0.4  PROT 7.5  ALBUMIN 3.3*    CBG: Recent Labs  Lab 06/27/20 0755  GLUCAP 189*     Recent Results (from the past 240 hour(s))  Resp Panel by RT-PCR (Flu A&B, Covid) Nasopharyngeal Swab     Status: None   Collection Time: 06/26/20 11:18 PM   Specimen: Nasopharyngeal Swab; Nasopharyngeal(NP) swabs in vial transport medium  Result Value Ref Range Status   SARS Coronavirus 2 by RT PCR NEGATIVE NEGATIVE Final    Comment: (NOTE) SARS-CoV-2 target nucleic acids are NOT DETECTED.  The SARS-CoV-2 RNA is generally detectable in upper respiratory specimens during the acute phase of infection. The lowest concentration of SARS-CoV-2 viral copies this assay can detect is 138 copies/mL. A negative result does not preclude SARS-Cov-2 infection and should not be used as the sole basis for treatment or other patient management decisions. A negative result may occur with  improper specimen collection/handling, submission of specimen other than nasopharyngeal swab, presence of viral mutation(s) within the areas targeted by this assay, and inadequate number of viral copies(<138 copies/mL). A negative result must be combined with clinical observations, patient history, and epidemiological information. The expected result is Negative.  Fact Sheet for Patients:  EntrepreneurPulse.com.au  Fact Sheet for Healthcare Providers:  IncredibleEmployment.be  This test is no t yet approved or cleared by the [place] FDA and  has been authorized for detection and/or diagnosis of SARS-CoV-2 by FDA under an Emergency Use Authorization (EUA). This EUA will remain  in  effect (meaning this test can be used) for the duration of the COVID-19 declaration under Section 564(b)(1) of the Act, 21 U.S.C.section 360bbb-3(b)(1), unless the authorization is terminated  or revoked sooner.       Influenza A by PCR NEGATIVE NEGATIVE Final   Influenza B by PCR NEGATIVE NEGATIVE Final    Comment: (NOTE) The Xpert Xpress SARS-CoV-2/FLU/RSV plus assay is intended as an aid in the diagnosis of influenza from Nasopharyngeal swab specimens and should not be used as a sole basis for treatment. Nasal washings and aspirates are unacceptable for Xpert Xpress SARS-CoV-2/FLU/RSV testing.  Fact Sheet for Patients: EntrepreneurPulse.com.au  Fact Sheet for Healthcare Providers: IncredibleEmployment.be  This test is not yet approved or cleared by the [place] FDA and has been authorized for detection and/or diagnosis of SARS-CoV-2 by FDA under an Emergency Use Authorization (EUA). This EUA will remain in effect (meaning this test can be used) for the duration of the COVID-19 declaration under Section 564(b)(1) of the Act, 21 U.S.C. section 360bbb-3(b)(1), unless the authorization is terminated or revoked.  Performed at [facility], [address], [place], [place] [address]          Radiology Studies: CT Head Wo Contrast  Result Date: 06/26/2020 CLINICAL DATA:  Initial evaluation for acute neuro deficit, stroke suspected. EXAM: CT HEAD WITHOUT CONTRAST TECHNIQUE: Contiguous axial images were obtained from the base of the skull through the vertex without intravenous contrast. COMPARISON:  None available. 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 There is an irregular heterogeneous mass positioned at the right occipital cortex measuring 3.5 x 4.3 x 4.2 cm in greatest dimensions, most concerning for a primary CNS neoplasm. Associated hyperdensity suggests a degree of associated  hemorrhage. Associated vasogenic edema throughout the posterior right parieto-occipital region with partial effacement of the posterior right lateral ventricle. No more than trace 2 mm right-to-left shift. Basilar cisterns remain patent. No other acute intracranial hemorrhage or large vessel territory infarct. No extra-axial fluid collection. Vascular: No hyperdense vessel. Scattered vascular calcifications noted within the carotid siphons. Skull: Scalp soft tissues and calvarium within normal limits. Sinuses/Orbits: Globes and orbital soft tissues demonstrate no acute finding. Paranasal sinuses are clear. No mastoid effusion. Other: None. IMPRESSION: 1. 3.5 x 4.3 x 4.2 cm irregular heterogeneous mass positioned at the right occipital cortex. Intrinsic hyperdensity suggests associated hemorrhage. Vasogenic edema throughout the posterior right parieto-occipital region with trace 2 mm right-to-left shift. Differential considerations include a primary CNS neoplasm versus solitary intracranial metastasis. 2. 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 Critical Value/emergent results were called by telephone at the time of interpretation on 06/26/2020 at 9:10 pm to provider [facility] , who verbally acknowledged these results. Electronically Signed   By: Jeannine Boga M.D.   On: 06/26/2020 21:13   DG Chest Portable 1 View  Result Date: 06/26/2020 CLINICAL DATA:  Fever, chills, weakness,  cough EXAM: PORTABLE CHEST 1 VIEW COMPARISON:  05/27/2020 FINDINGS: Consolidation again seen in the right upper lobe compatible with pneumonia. This has improved since prior study. No confluent opacity on the left. Heart is normal size. Aortic atherosclerosis. No effusions. No acute bony abnormality. IMPRESSION: Continued but improving right upper lobe consolidation/pneumonia. Electronically Signed   By: Rolm Baptise M.D.   On: 06/26/2020 20:27    Scheduled Meds: . atorvastatin  10 mg Oral  q1800  . azithromycin  500 mg Oral Daily  . dexamethasone  4 mg Oral Q6H  . insulin aspart  0-6 Units Subcutaneous Q4H  . traZODone  50 mg Oral QHS   Continuous Infusions: . sodium chloride 100 mL/hr at 06/27/20 0120  . cefTRIAXone (ROCEPHIN)  IV Stopped (06/27/20 0157)     LOS: 1 day    Time spent: 35 minutes.    Barton Dubois, MD [facility]   To contact the attending provider between 7A-7P or the covering provider during after hours 7P-7A, please log into the web site [email or web address] and access using universal [place] password for that web site. If you do not have the password, please call the hospital operator.  06/27/2020, 8:14 AM

## 2020-06-27 NOTE — ED Notes (Signed)
Called [facility] with assigned bed and for transport to [facility].

## 2020-06-27 NOTE — Progress Notes (Signed)
Called by ED MD, CT reviewed, case discussed  Rec steroids and medical admission and transfer to [facility] for met/primary CNS workup and Nsx eval  Likely will need nsx intervention  Hold plavix  Awaiting arrival to [facility] Swannie Milius, DO Neurosurgeon

## 2020-06-28 ENCOUNTER — Inpatient Hospital Stay (HOSPITAL_COMMUNITY): Payer: Medicare HMO

## 2020-06-28 DIAGNOSIS — C719 Malignant neoplasm of brain, unspecified: Secondary | ICD-10-CM | POA: Diagnosis not present

## 2020-06-28 DIAGNOSIS — I2782 Chronic pulmonary embolism: Secondary | ICD-10-CM | POA: Diagnosis not present

## 2020-06-28 DIAGNOSIS — R918 Other nonspecific abnormal finding of lung field: Secondary | ICD-10-CM | POA: Diagnosis not present

## 2020-06-28 DIAGNOSIS — I2699 Other pulmonary embolism without acute cor pulmonale: Secondary | ICD-10-CM

## 2020-06-28 DIAGNOSIS — E119 Type 2 diabetes mellitus without complications: Secondary | ICD-10-CM | POA: Diagnosis not present

## 2020-06-28 DIAGNOSIS — J984 Other disorders of lung: Secondary | ICD-10-CM | POA: Diagnosis not present

## 2020-06-28 DIAGNOSIS — I619 Nontraumatic intracerebral hemorrhage, unspecified: Secondary | ICD-10-CM

## 2020-06-28 DIAGNOSIS — G9389 Other specified disorders of brain: Secondary | ICD-10-CM | POA: Diagnosis not present

## 2020-06-28 DIAGNOSIS — C7931 Secondary malignant neoplasm of brain: Secondary | ICD-10-CM

## 2020-06-28 DIAGNOSIS — D509 Iron deficiency anemia, unspecified: Secondary | ICD-10-CM

## 2020-06-28 LAB — CBC
HCT: 24.8 % — ABNORMAL LOW (ref 39.0–52.0)
Hemoglobin: 7.1 g/dL — ABNORMAL LOW (ref 13.0–17.0)
MCH: 21.2 pg — ABNORMAL LOW (ref 26.0–34.0)
MCHC: 28.6 g/dL — ABNORMAL LOW (ref 30.0–36.0)
MCV: 74 fL — ABNORMAL LOW (ref 80.0–100.0)
Platelets: 683 10*3/uL — ABNORMAL HIGH (ref 150–400)
RBC: 3.35 MIL/uL — ABNORMAL LOW (ref 4.22–5.81)
RDW: 18.6 % — ABNORMAL HIGH (ref 11.5–15.5)
WBC: 10.8 10*3/uL — ABNORMAL HIGH (ref 4.0–10.5)
nRBC: 0.3 % — ABNORMAL HIGH (ref 0.0–0.2)

## 2020-06-28 LAB — BASIC METABOLIC PANEL
Anion gap: 9 (ref 5–15)
BUN: 24 mg/dL — ABNORMAL HIGH (ref 8–23)
CO2: 22 mmol/L (ref 22–32)
Calcium: 8.5 mg/dL — ABNORMAL LOW (ref 8.9–10.3)
Chloride: 100 mmol/L (ref 98–111)
Creatinine, Ser: 0.85 mg/dL (ref 0.61–1.24)
GFR, Estimated: >60 mL/min (ref >60)
Glucose, Bld: 288 mg/dL — ABNORMAL HIGH (ref 70–99)
Potassium: 3.8 mmol/L (ref 3.5–5.1)
Sodium: 131 mmol/L — ABNORMAL LOW (ref 135–145)

## 2020-06-28 LAB — HEMOGLOBIN AND HEMATOCRIT, BLOOD
HCT: 25.3 % — ABNORMAL LOW (ref 39.0–52.0)
HCT: 27.8 % — ABNORMAL LOW (ref 39.0–52.0)
Hemoglobin: 7.5 g/dL — ABNORMAL LOW (ref 13.0–17.0)
Hemoglobin: 8.3 g/dL — ABNORMAL LOW (ref 13.0–17.0)

## 2020-06-28 LAB — GLUCOSE, CAPILLARY
Glucose-Capillary: 233 mg/dL — ABNORMAL HIGH (ref 70–99)
Glucose-Capillary: 247 mg/dL — ABNORMAL HIGH (ref 70–99)
Glucose-Capillary: 255 mg/dL — ABNORMAL HIGH (ref 70–99)
Glucose-Capillary: 311 mg/dL — ABNORMAL HIGH (ref 70–99)
Glucose-Capillary: 312 mg/dL — ABNORMAL HIGH (ref 70–99)
Glucose-Capillary: 325 mg/dL — ABNORMAL HIGH (ref 70–99)

## 2020-06-28 LAB — LEGIONELLA PNEUMOPHILA SEROGP 1 UR AG: L. pneumophila Serogp 1 Ur Ag: NEGATIVE

## 2020-06-28 LAB — PROCALCITONIN: Procalcitonin: <0.1 ng/mL

## 2020-06-28 LAB — IRON AND TIBC
Iron: 12 ug/dL — ABNORMAL LOW (ref 45–182)
Saturation Ratios: 3 % — ABNORMAL LOW (ref 17.9–39.5)
TIBC: 393 ug/dL (ref 250–450)
UIBC: 381 ug/dL

## 2020-06-28 LAB — FERRITIN: Ferritin: 46 ng/mL (ref 24–336)

## 2020-06-28 LAB — VITAMIN B12: Vitamin B-12: 240 pg/mL (ref 180–914)

## 2020-06-28 LAB — PREPARE RBC (CROSSMATCH)

## 2020-06-28 LAB — FOLATE: Folate: 9 ng/mL

## 2020-06-28 IMAGING — MR MR HEAD W/O CM
9 series · 48 of 48 positions shown · non-contrast
Comparison: Head CT without contrast [DATE].

CLINICAL DATA: 79-year-old male with neurologic deficit, hemorrhage
and edema in the right occipital lobe on noncontrast head CT. And
enlarging right upper lobe lung masslike opacity on CT Chest,
Abdomen, and Pelvis. Possible hemorrhagic metastasis or primary
tumor.

EXAM:
MRI HEAD WITHOUT CONTRAST
TECHNIQUE: Multiplanar, multiecho pulse sequences of the brain and surrounding
structures were obtained without intravenous contrast.

[Series 9: FLAIR · sagittal · 3.0mm · 0.47mm/px · 1 of 56 slices shown]
[im 1/56]
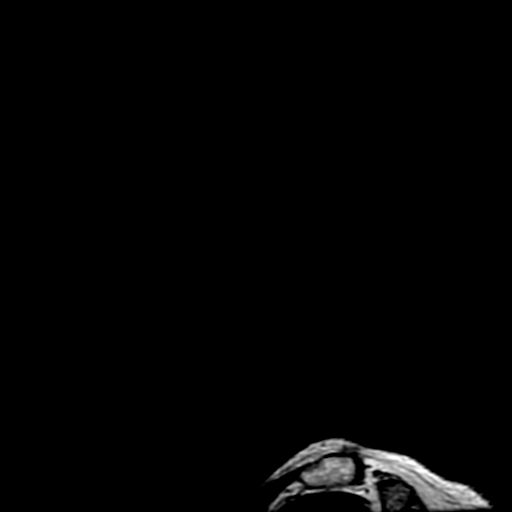

[Series 10: ax dti · axial · 3.0mm · 1.02mm/px · z∈[-115,+83]mm · 18 of 1742 slices shown]
[im 1/1742]
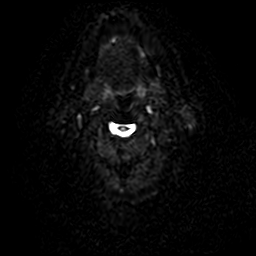
[im 103/1742]
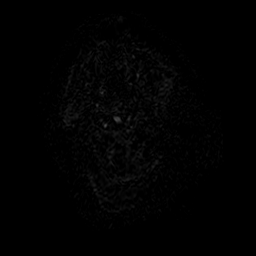
[im 205/1742]
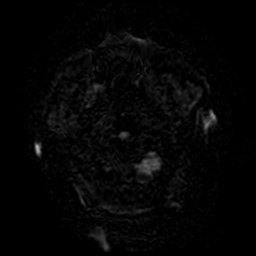
[im 308/1742]
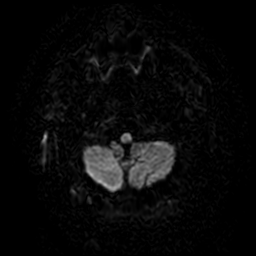
[im 410/1742]
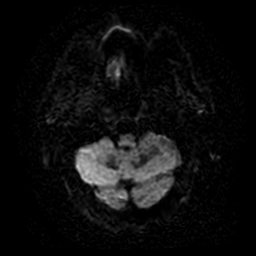
[im 513/1742]
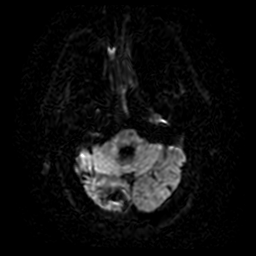
[im 615/1742]
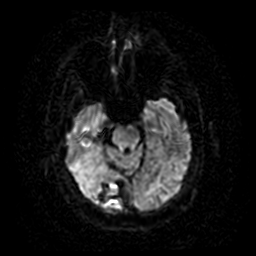
[im 717/1742]
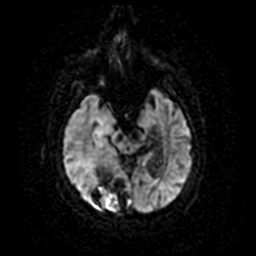
[im 820/1742]
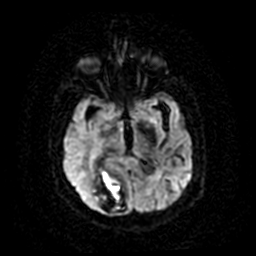
[im 922/1742]
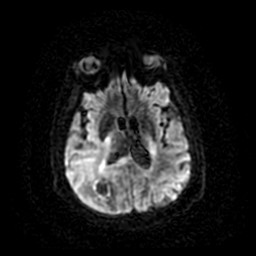
[im 1025/1742]
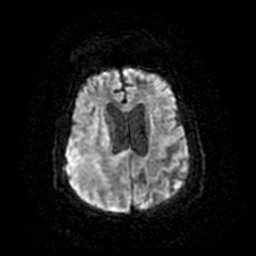
[im 1127/1742]
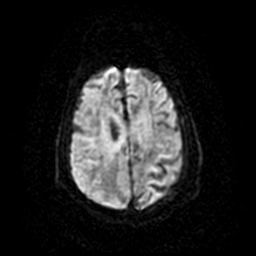
[im 1229/1742]
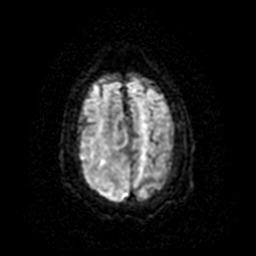
[im 1332/1742]
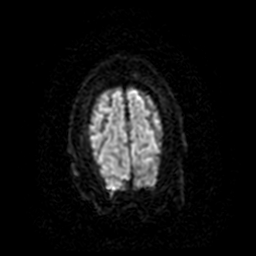
[im 1434/1742]
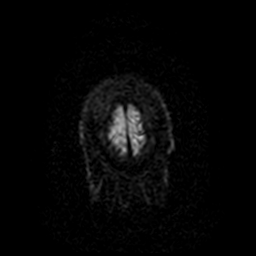
[im 1537/1742]
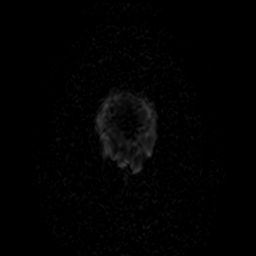
[im 1639/1742]
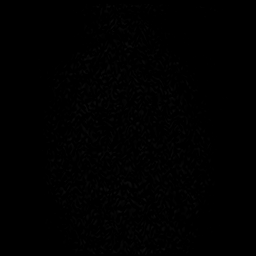
[im 1742/1742]
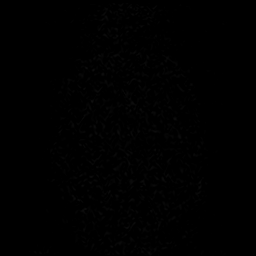

[Series 11: T2 · axial · 5.0mm · 0.51mm/px · 1 of 32 slices shown]
[im 1/32]
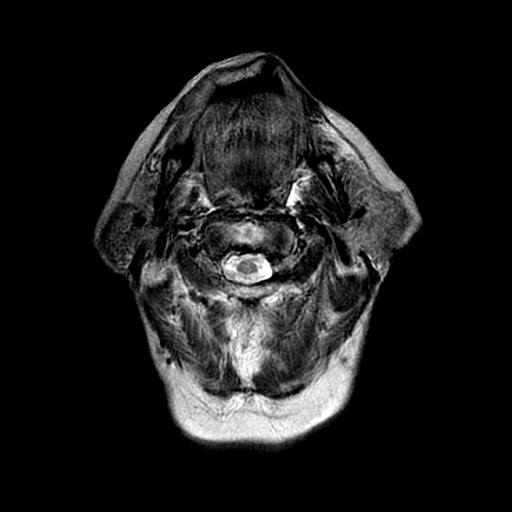

[Series 13: (person_name) · axial · 3.0mm · 0.47mm/px · 1 of 124 slices shown]
[im 1/124]
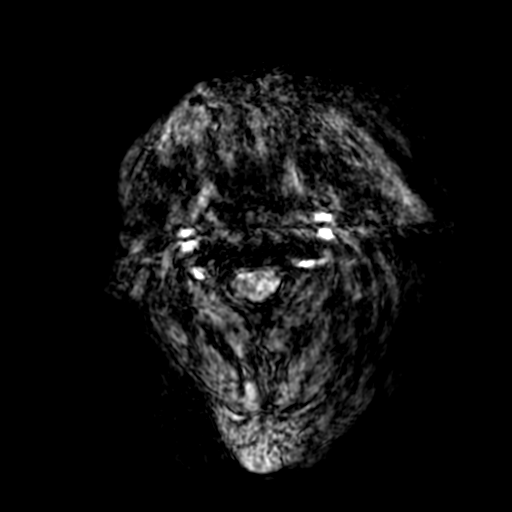

[Series 14: T1 · axial · non-contrast · 1.0mm · 1.02mm/px · 1 of 190 slices shown]
[im 1/190]
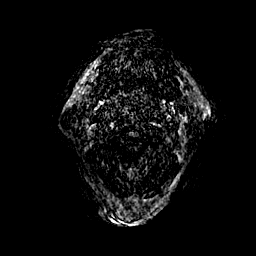

[Series 310: orig: ax dti · axial · 3.0mm · 1.02mm/px · z∈[-85,+116]mm · 5 of 452 slices shown (1 of 2)]
[im 1/452]
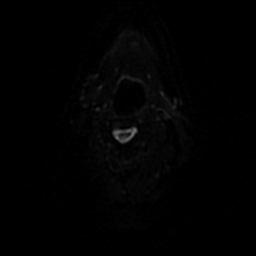
[im 113/452]
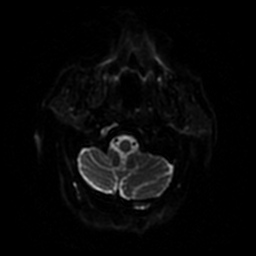
[im 226/452]
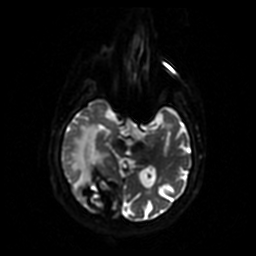
[im 339/452]
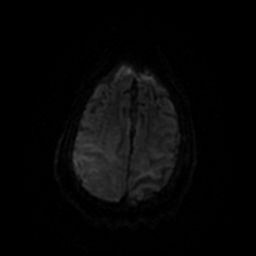
[im 452/452]
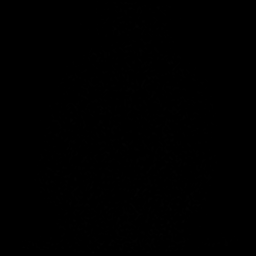

[Series 1010: orig: ax dti · axial · 3.0mm · 1.02mm/px · z∈[-115,+83]mm · 19 of 1742 slices shown (2 of 2)]
[im 1/1742]
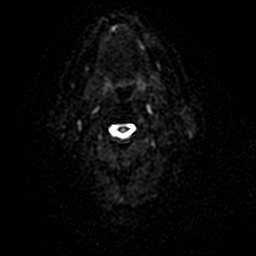
[im 97/1742]
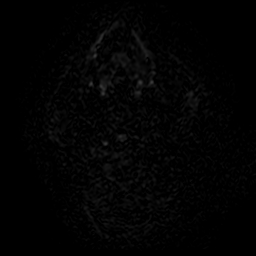
[im 194/1742]
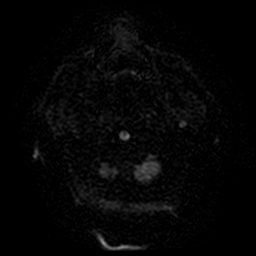
[im 291/1742]
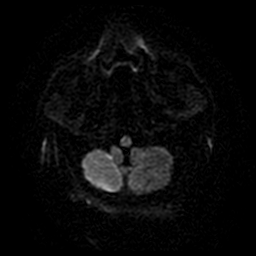
[im 387/1742]
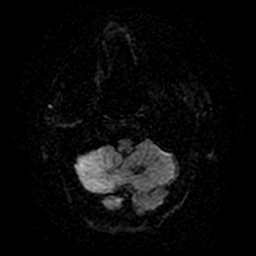
[im 484/1742]
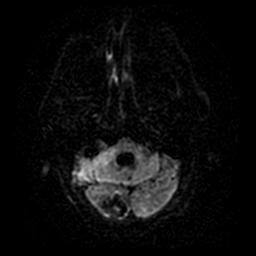
[im 581/1742]
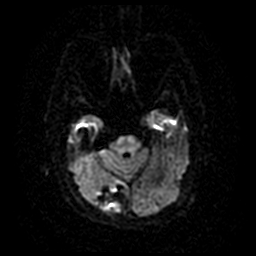
[im 678/1742]
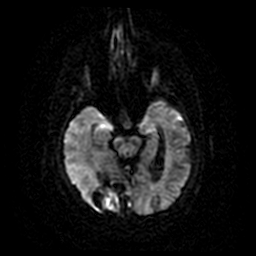
[im 774/1742]
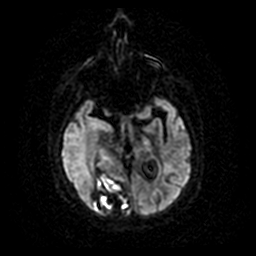
[im 871/1742]
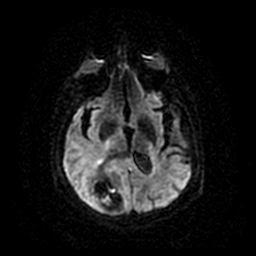
[im 968/1742]
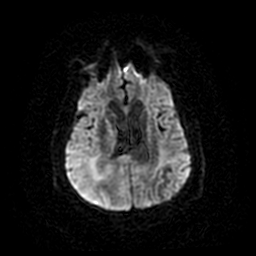
[im 1064/1742]
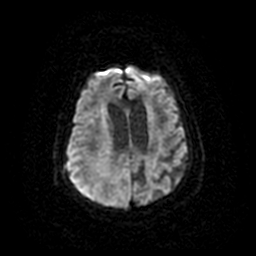
[im 1161/1742]
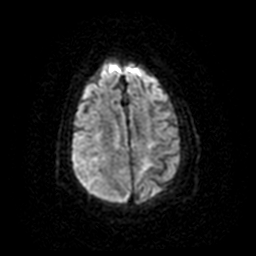
[im 1258/1742]
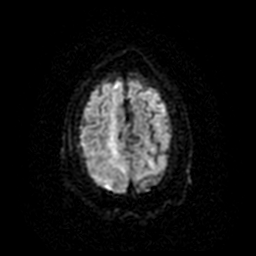
[im 1355/1742]
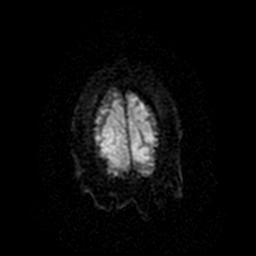
[im 1451/1742]
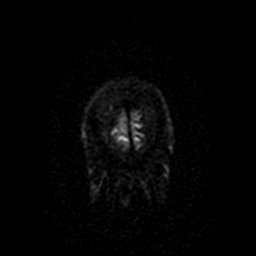
[im 1548/1742]
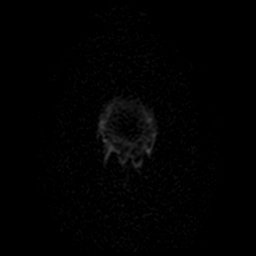
[im 1645/1742]
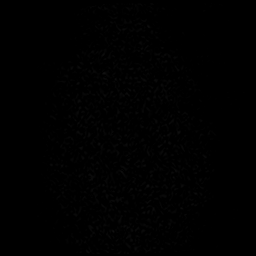
[im 1742/1742]
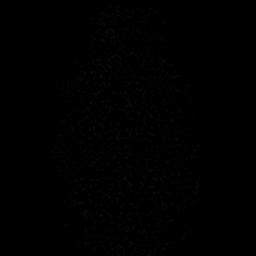

[Series 1050: trace · axial · 3.0mm · 1.02mm/px · 1 of 62 slices shown]
[im 1/62]
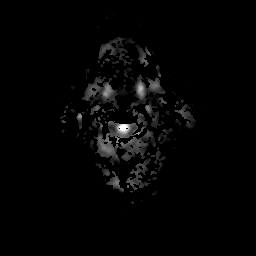

[Series 1052: avdc (10^-6 mm²/s)(no-q) · axial · 3.0mm · 1.02mm/px · 1 of 62 slices shown]
[im 1/62]
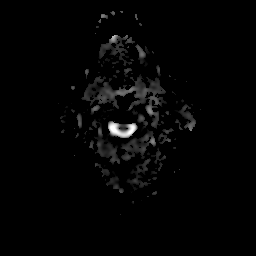

[48 of 48 positions shown; findings below may reference images not displayed]

FINDINGS: The examination had to be discontinued prior to completion due to
patient inability to continue, despite premedication.

No contrast was administered. Axial DWI/DTI, along with T2, SWI and
T1 weighted imaging was obtained.

Brain: Hemorrhage related susceptibility artifact throughout the
right occipital pole. Areas of T1 and T2 hyperintense blood products
demonstrate diffusion restriction, but there is no larger area of
restricted diffusion to strongly suggest acute infarction.

Heterogeneous T1 and T2 blood products with decreased susceptibility
throughout the right occipital pole tracking to the right occipital
horn encompass an area of 56 x 37 x 42 mm (AP by transverse by CC),
for an estimated volume of 43 mL. Confluent vasogenic edema tracking
superiorly and anteriorly from the hemorrhage with facilitated
diffusion and appears stable from the recent CT. Stable regional
mass effect. No midline shift at this time. Basilar cisterns remain
patent.

Small volume of right lateral intraventricular hemorrhage. No
ventriculomegaly.

Chronic lacunar infarct left thalamus. No definite additional
intracranial hemosiderin, suspect artifact rather than superficial
siderosis on SWI in the left perirolandic region on series 13, image
86.

Cervicomedullary junction and pituitary are within normal limits. No
other vasogenic edema is evident. No other suspicious brain lesion
is evident on the provided sequences.

Vascular: Major intracranial vascular flow voids are preserved.

Skull and upper cervical spine: Negative. Visualized bone marrow
signal is within normal limits.

Sinuses/Orbits: Stable, negative.

Other: Mastoids are clear.
IMPRESSION: 1. Truncated exam and motion degraded despite premedication and
repeated imaging attempts. No contrast was administered.

2. Apparently solitary acute hemorrhage or hemorrhagic lesion in the
right occipital pole encompassing 43 mL, indeterminate for tumor.
Abundant regional vasogenic edema and mild mass effect stable from
the CT.
Trace right lateral IVH with no ventriculomegaly.

3. Chronic lacunar infarct left thalamus.

## 2020-06-28 MED ORDER — INSULIN ASPART 100 UNIT/ML ~~LOC~~ SOLN
0.0000 [IU] | Freq: Three times a day (TID) | SUBCUTANEOUS | Status: DC
  Administered 2020-06-28: 7 [IU] via SUBCUTANEOUS
  Administered 2020-06-28: 3 [IU] via SUBCUTANEOUS
  Administered 2020-06-29: 5 [IU] via SUBCUTANEOUS
  Administered 2020-06-29: 7 [IU] via SUBCUTANEOUS

## 2020-06-28 MED ORDER — INSULIN GLARGINE 100 UNIT/ML ~~LOC~~ SOLN
8.0000 [IU] | Freq: Every day | SUBCUTANEOUS | Status: DC
  Administered 2020-06-28 – 2020-06-29 (×2): 8 [IU] via SUBCUTANEOUS
  Filled 2020-06-28 (×2): qty 0.08

## 2020-06-28 MED ORDER — HEPARIN SODIUM (PORCINE) 5000 UNIT/ML IJ SOLN
5000.0000 [IU] | Freq: Two times a day (BID) | INTRAMUSCULAR | Status: DC
  Administered 2020-06-28: 5000 [IU] via SUBCUTANEOUS
  Filled 2020-06-28: qty 1

## 2020-06-28 MED ORDER — INSULIN ASPART 100 UNIT/ML ~~LOC~~ SOLN
0.0000 [IU] | Freq: Every day | SUBCUTANEOUS | Status: DC
  Administered 2020-06-28: 4 [IU] via SUBCUTANEOUS
  Administered 2020-06-29 – 2020-06-30 (×2): 3 [IU] via SUBCUTANEOUS
  Administered 2020-07-01 – 2020-07-02 (×2): 2 [IU] via SUBCUTANEOUS
  Administered 2020-07-03: 4 [IU] via SUBCUTANEOUS
  Administered 2020-07-04: 3 [IU] via SUBCUTANEOUS
  Administered 2020-07-05: 2 [IU] via SUBCUTANEOUS
  Administered 2020-07-06: 3 [IU] via SUBCUTANEOUS
  Administered 2020-07-07 – 2020-07-13 (×3): 2 [IU] via SUBCUTANEOUS

## 2020-06-28 MED ORDER — ALUM & MAG HYDROXIDE-SIMETH 200-200-20 MG/5ML PO SUSP
30.0000 mL | Freq: Once | ORAL | Status: AC
  Administered 2020-06-28: 30 mL via ORAL
  Filled 2020-06-28: qty 30

## 2020-06-28 MED ORDER — LORAZEPAM 2 MG/ML IJ SOLN
0.2500 mg | Freq: Once | INTRAMUSCULAR | Status: AC
  Administered 2020-06-28: 0.25 mg via INTRAVENOUS
  Filled 2020-06-28: qty 1

## 2020-06-28 MED ORDER — PANTOPRAZOLE SODIUM 40 MG PO TBEC
40.0000 mg | DELAYED_RELEASE_TABLET | Freq: Every day | ORAL | Status: DC
  Administered 2020-06-28 – 2020-06-29 (×2): 40 mg via ORAL
  Filled 2020-06-28 (×2): qty 1

## 2020-06-28 MED ORDER — LIDOCAINE VISCOUS HCL 2 % MT SOLN
15.0000 mL | Freq: Once | OROMUCOSAL | Status: AC
  Administered 2020-06-28: 15 mL via ORAL
  Filled 2020-06-28 (×2): qty 15

## 2020-06-28 MED ORDER — SODIUM CHLORIDE 0.9% IV SOLUTION: Freq: Once | INTRAVENOUS | Status: AC

## 2020-06-28 MED ORDER — ACETAMINOPHEN 325 MG PO TABS
650.0000 mg | ORAL_TABLET | Freq: Once | ORAL | Status: AC
  Administered 2020-06-28: 650 mg via ORAL
  Filled 2020-06-28: qty 2

## 2020-06-28 MED ORDER — DIPHENHYDRAMINE HCL 25 MG PO CAPS
25.0000 mg | ORAL_CAPSULE | Freq: Once | ORAL | Status: AC
  Administered 2020-06-28: 25 mg via ORAL
  Filled 2020-06-28: qty 1

## 2020-06-28 NOTE — Progress Notes (Addendum)
NAME:  Thomas Garcia, MRN:  [id number], DOB:  1940-10-07, LOS: 2 ADMISSION DATE:  06/26/2020, CONSULTATION DATE:  06/27/2020 REFERRING MD:  Dr. Myna Hidalgo, CHIEF COMPLAINT:  Lung mass    History of Present Illness:  Thomas Garcia is a 80 y.o. 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 Of note patient was seen at this health system 1 month ago for cough and hemoptysis and was found to have small bilateral age-indeterminate PE's and a right upper lobe mass but unfortunately patient refused admission.   Since that ED visit patient has experienced worsened  generalized weakness, fatigue, nausea, hemoptysis, and loss of appetite. He reports a ~80lb weight loss over the span of a year. Additional he reports chills, vision changes, progressive weakness, and cough with hemoptysis. He reports a 80ppd smoking history.   Given lung mass with high suspicion for metastatic cancer PCCM was consulted for assistance in tissue sampling.    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 Admitted 3/21  Interim History / Subjective:  Today his main complaint is his MRI from overnight. He denies SOB.  Objective   Blood pressure 129/60, pulse 74, temperature (!) 97.2 F (36.2 C), resp. rate (!) 24, height 5\' 9"  (1.753 m), weight 81.6 kg, SpO2 99 %.        Intake/Output Summary (Last 24 hours) at 06/28/2020 1141 Last data filed at 06/28/2020 0900 Gross per 24 hour  Intake 1000 ml  Output 1150 ml  Net -150 ml   Filed Weights   06/26/20 1856  Weight: 81.6 kg    Examination: General: chronically ill appearing man sitting up in bed in NAD HEENT: [place]/AT, eyes anicteric Neuro: awake, moving all extremities spontaneously. Strength testing not performed. CV: S1S2, RRR, no murmurs PULM:   Breathing comfortably on RA, CTAB but diminshed throughout.   GI: soft, NT, ND extremities:  BKA LLE Skin: no rashes or lesions  Labs/imaging that I havepersonally reviewed    CT C/A/P> large lung mass with likely lymphangitic spread, mediastinal adenopathy.  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 No sites to biopsy outside of pulmonary or CNS, so will have to wait for plavix washout. -Discussed with NS- planning for possible MRI with anesthesia to help with decision for best procedure to find a diagnosis. Timing of when this will be possible not yet clear. -pulmonary hygiene, mobility as able -if massive hemoptysis, would need IR for embolization of mass  -Son Oather updated by phone of plan.   PE -recommend holding AC for now given anemia, hemoptysis -IVC filter would be appropriate in this situation  Chronic anemia -transfuse for Hb<7 or hemodynamically significant bleeding   Best practice (evaluated daily)  Per primary  Labs   CBC: Recent Labs  Lab 06/26/20 2045 06/27/20 0529 06/28/20 0153 06/28/20 1024  WBC 12.8* 12.4* 10.8*  --   NEUTROABS 10.3*  --   --   --   HGB 7.8* 7.0* 7.1* 7.5*  HCT 27.7* 24.7* 24.8* 25.3*  MCV 76.3* 75.1* 74.0*  --   PLT 745* 666* 683*  --     Basic Metabolic Panel: Recent Labs  Lab 06/26/20 2047 06/27/20 0529 06/28/20 0153  NA 132* 133* 131*  K 4.0 4.4 3.8  CL 98 102 100  CO2 22 22 22   GLUCOSE 157* 166* 288*  BUN 23 26* 24*  CREATININE 1.01 0.90 0.85  CALCIUM 8.7* 8.4* 8.5*  MG 2.1  --   --     Julian Hy, DO 06/28/20 11:58 AM  Pulmonary & Critical Care  From 7AM- 7PM if no response to pager,  please call [phone]. After hours, 7PM- 7AM, please call Elink  [phone].

## 2020-06-28 NOTE — Progress Notes (Addendum)
   Providing Compassionate, Quality Care - Together  NEUROSURGERY PROGRESS NOTE   S: No issues overnight, did not tolerate mri without anesthesia  O: EXAM:  BP 129/60 (BP Location: Left Arm)   Pulse 74   Temp (!) 97.2 F (36.2 C)   Resp (!) 24   Ht 5\' 9"  (1.753 m)   Wt 81.6 kg   SpO2 99%   BMI 26.58 kg/m   Awake alert oriented x3 Poor dentition No acute distress Face symmetric PERRLA EOMI Left-sided neglect Left hemianopsia No drift Bilateral upper extremities 4+/5 Bilateral lower extremities 4+5 (left BKA) Sensory intact light touch   ASSESSMENT:  80 y.o. male with   1. 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  I answered all of his questions. -Tentatively planning craniotomy pending MRI findings for early next week given his Plavix was likely taken last on Monday. -Okay for DVT prophylaxis with subQ heparin    Thank you for allowing me to participate in this patient's care.  Please do not hesitate to call with questions or concerns.   Elwin Sleight, [facility] Cell: [phone]

## 2020-06-28 NOTE — Progress Notes (Signed)
PROGRESS NOTE    SHAKIL DIRK  NUU:[id number] DOB: Oct 08, 1940 DOA: 06/26/2020 PCP: Doree Albee, MD    Chief Complaint  Patient presents with  . Generalized Body Aches    Brief Narrative:  As per H&P written by Dr. Myna Hidalgo on 06/26/20 Thomas Mule Wilsonis a 80 y.o.malewith medical history significant forhypertension, type 2 diabetes mellitus, and peripheral arterial disease, now presenting to the emergency department for evaluation of general weakness, fatigue, nausea, loss of appetite, and cough with scant hemoptysis. Patient was seen in the emergency department 1 month ago complaining of shortness of breath and hemoptysis, was found to have small bilateral PE that were age-indeterminate as well as a masslike consolidation involving the right upper lobe with some cavitation, and was recommended for admission to the hospital but refused at that time and went home.Since then, he has had progressive general weakness, fatigue, nausea, progressive loss of appetite, and seems to be confused at times per report of his son at the bedside. He continues to have cough and shortness of breath and continues to have some blood-streaked sputum. He has chills intermittently but has not taken his temperature. Resting tremor seems to be worsening per report of his son. Patient reports some headaches but denies focal numbness or weakness. Reports that he recently quit smoking. He uses a wheelchair at baseline.  ED Course:Upon arrival to the ED, patient is found to beafebrile, saturating mid 90s on room air, and with stable blood pressure in the 90s to low 347Q systolic. EKG with sinus rhythm, RBBB, and LAFB. Chest x-ray with improving right upper lobe consolidation. Head CT concerning for a mass at the right occipital cortex with associated hemorrhageand vasogenic edema with 2 mm right to left shift. Chemistry panel features a glucose 157 and sodium 132. CBC with increased leukocytosis, stable  microcytic anemia, and increasing thrombocytosis. Neurosurgery was consulted by the ED physician and recommended medical admission to [facility].  Patient placed empirically on IV antibiotics due to concern for postobstructive pneumonia.  Neurosurgery consulted and following.  PCCM also consulted.  Assessment & Plan:   Principal Problem:   Brain mass Active Problems:   PVD (peripheral vascular disease) (HCC)   Essential hypertension   Diabetes mellitus type 2 in nonobese (HCC)   Cavitating mass in right upper lung lobe   Hyponatremia   Microcytic anemia   Pulmonary embolism (HCC)  #1 new diagnosis of brain mass -Patient noted to have brain mass involving right occipital cortex with hemorrhagic, vasogenic edema and 2 mm right-to-left shift. -Patient transferred to [facility] for evaluation by neurosurgery. -Patient seen by neurosurgery Dr.Dawley, who recommended CT chest abdomen and pelvis for metastatic work-up, MRI brain with and without contrast, holding Plavix (last dose 06/26/2020), Decadron 4 mg p.o. every 6 hours, Keppra 500 mg twice daily. -Per neurosurgery potential for craniotomy pending of imaging work-up. -Per neurosurgery.  2.  Right upper lobe lung mass, highly suspicious for lung cancer; no cavitation (no concern for TB)/hemoptysis -Patient with sats of 100% on room air. -Patient stated minimal streaks in sputum. -Continue empiric IV antibiotics for possible postobstructive pneumonia. -Patient seen in consultation by pulmonary who recommended to continue to hold Plavix for at least 5 days, no sites to biopsy outside pulmonary or CNS so we will have to wait for Plavix to washout. -Per pulmonary if massive hemoptysis will need IR for embolization of mass. -PCCM/pulmonary following and appreciate input and recommendations.  3.  PE -Patient presented with hemoptysis,  noted to have a anemia, brain mass with hemorrhagic vasogenic edema. -Lower extremity  Dopplers pending. -Continue to hold anticoagulation in this situation per pulmonary recommendations. -If lower extremity Dopplers positive for DVT will need IVC filter.  3.  Chronic anemia/iron deficiency anemia -Hemoglobin currently at 7.1. -Repeat hemoglobin at 7.5. -Transfuse of 1 units packed red blood cells ordered earlier on this morning. -May need IV iron pending anemia panel.  4.  Peripheral arterial disease -Status post left BKA. -Continue to hold Plavix secondary to problem #1 and 2. -Statin.  5.  Hypertension Blood pressure noted to be soft. -Continue to hold lisinopril. -Follow.  6.  Diabetes mellitus type 2 Hemoglobin A1c 6.4 (06/27/2020) -CBG noted to be elevated at 312 this morning. -Elevated blood glucose levels likely secondary to initiation of steroids. -Start Lantus 8 units daily. -SSI.  7.  GERD -PPI daily. -GI cocktail as needed.  8.  Hyponatremia -Likely secondary to hypovolemic hyponatremia in the setting of decreased oral intake. -Possible component of SIADH due to lung mass noted. -Follow.  May need gentle hydration.    DVT prophylaxis: SCDs Code Status: Full Family Communication: Updated patient.  No family at bedside. Disposition:   Status is: Inpatient    Dispo: The patient is from: Home              Anticipated d/c is to: Home with home health               Patient currently being evaluated by neurosurgery, PCCM.  On IV antibiotics.  Being transfused 2 units packed red blood cells.  Not stable for discharge.   Difficult to place patient undetermined       Consultants:   PCCM: Dr. Tamala Julian 06/27/2020  Neurosurgery: Dr. Reatha Armour 06/27/2020  Procedures:   CT head without contrast 06/26/2020  CT chest abdomen and pelvis 06/27/2020  Chest x-ray 06/26/2020  MRI brain with contrast 06/28/2020 pending  MRI brain without contrast 06/28/2020  Lower extremity Dopplers 06/28/2020 pending  Transfusion 2 units packed red blood cells pending  06/28/2020  Antimicrobials:   IV Rocephin 06/27/2020>>>   Subjective: Patient states he is feeling better.  Denies any chest pain.  No shortness of breath.  No lightheadedness.  No overt bleeding.  Patient with complaints of heartburn per RN.  Objective: Vitals:   06/28/20 0330 06/28/20 0335 06/28/20 0841 06/28/20 0844  BP: 112/72 112/72 110/79 110/79  Pulse: 81 93 78 73  Resp: (!) 21 17 18 18   Temp:  (!) 97.5 F (36.4 C)  97.7 F (36.5 C)  TempSrc:  Oral    SpO2: (!) 88% 98%  99%  Weight:      Height:        Intake/Output Summary (Last 24 hours) at 06/28/2020 1135 Last data filed at 06/28/2020 0900 Gross per 24 hour  Intake 1000 ml  Output 1150 ml  Net -150 ml   Filed Weights   06/26/20 1856  Weight: 81.6 kg    Examination:  General exam: Appears calm and comfortable  Respiratory system: Some coarse breath sounds right base.  No wheezing.  No crackles.  Fair air movement.  Speaking in full sentences.  Cardiovascular system: S1 & S2 heard, RRR. No JVD, murmurs, rubs, gallops or clicks. No pedal edema. Gastrointestinal system: Abdomen is nondistended, soft and nontender. No organomegaly or masses felt. Normal bowel sounds heard. Central nervous system: Alert and oriented. No focal neurological deficits. Extremities: Status post left BKA Skin: No rashes, lesions or ulcers  Psychiatry: Judgement and insight appear normal. Mood & affect appropriate.     Data Reviewed: I have personally reviewed following labs and imaging studies  CBC: Recent Labs  Lab 06/26/20 2045 06/27/20 0529 06/28/20 0153 06/28/20 1024  WBC 12.8* 12.4* 10.8*  --   NEUTROABS 10.3*  --   --   --   HGB 7.8* 7.0* 7.1* 7.5*  HCT 27.7* 24.7* 24.8* 25.3*  MCV 76.3* 75.1* 74.0*  --   PLT 745* 666* 683*  --     Basic Metabolic Panel: Recent Labs  Lab 06/26/20 2047 06/27/20 0529 06/28/20 0153  NA 132* 133* 131*  K 4.0 4.4 3.8  CL 98 102 100  CO2 22 22 22   GLUCOSE 157* 166* 288*  BUN 23  26* 24*  CREATININE 1.01 0.90 0.85  CALCIUM 8.7* 8.4* 8.5*  MG 2.1  --   --     GFR: Estimated Creatinine Clearance: 72.8 mL/min (by C-G formula based on SCr of 0.85 mg/dL).  Liver Function Tests: Recent Labs  Lab 06/26/20 2047  AST 12*  ALT 11  ALKPHOS 74  BILITOT 0.4  PROT 7.5  ALBUMIN 3.3*    CBG: Recent Labs  Lab 06/27/20 1552 06/27/20 1946 06/28/20 0043 06/28/20 0338 06/28/20 0845  GLUCAP 209* 210* 247* 312* 255*     Recent Results (from the past 240 hour(s))  Resp Panel by RT-PCR (Flu A&B, Covid) Nasopharyngeal Swab     Status: None   Collection Time: 06/26/20 11:18 PM   Specimen: Nasopharyngeal Swab; Nasopharyngeal(NP) swabs in vial transport medium  Result Value Ref Range Status   SARS Coronavirus 2 by RT PCR NEGATIVE NEGATIVE Final    Comment: (NOTE) SARS-CoV-2 target nucleic acids are NOT DETECTED.  The SARS-CoV-2 RNA is generally detectable in upper respiratory specimens during the acute phase of infection. The lowest concentration of SARS-CoV-2 viral copies this assay can detect is 138 copies/mL. A negative result does not preclude SARS-Cov-2 infection and should not be used as the sole basis for treatment or other patient management decisions. A negative result may occur with  improper specimen collection/handling, submission of specimen other than nasopharyngeal swab, presence of viral mutation(s) within the areas targeted by this assay, and inadequate number of viral copies(<138 copies/mL). A negative result must be combined with clinical observations, patient history, and epidemiological information. The expected result is Negative.  Fact Sheet for Patients:  EntrepreneurPulse.com.au  Fact Sheet for Healthcare Providers:  IncredibleEmployment.be  This test is no t yet approved or cleared by the [place] FDA and  has been authorized for detection and/or diagnosis of SARS-CoV-2 by FDA under an  Emergency Use Authorization (EUA). This EUA will remain  in effect (meaning this test can be used) for the duration of the COVID-19 declaration under Section 564(b)(1) of the Act, 21 U.S.C.section 360bbb-3(b)(1), unless the authorization is terminated  or revoked sooner.       Influenza A by PCR NEGATIVE NEGATIVE Final   Influenza B by PCR NEGATIVE NEGATIVE Final    Comment: (NOTE) The Xpert Xpress SARS-CoV-2/FLU/RSV plus assay is intended as an aid in the diagnosis of influenza from Nasopharyngeal swab specimens and should not be used as a sole basis for treatment. Nasal washings and aspirates are unacceptable for Xpert Xpress SARS-CoV-2/FLU/RSV testing.  Fact Sheet for Patients: EntrepreneurPulse.com.au  Fact Sheet for Healthcare Providers: IncredibleEmployment.be  This test is not yet approved or cleared by the [place] FDA and has been authorized for detection and/or diagnosis of  SARS-CoV-2 by FDA under an Emergency Use Authorization (EUA). This EUA will remain in effect (meaning this test can be used) for the duration of the COVID-19 declaration under Section 564(b)(1) of the Act, 21 U.S.C. section 360bbb-3(b)(1), unless the authorization is terminated or revoked.  Performed at [facility], [address], [place], [place] [address]   MRSA PCR Screening     Status: None   Collection Time: 06/27/20  3:58 PM   Specimen: Nasopharyngeal  Result Value Ref Range Status   MRSA by PCR NEGATIVE NEGATIVE Final    Comment:        The GeneXpert MRSA Assay (FDA approved for NASAL specimens only), is one component of a comprehensive MRSA colonization surveillance program. It is not intended to diagnose MRSA infection nor to guide or monitor treatment for MRSA infections. Performed at [facility], [place] [address], [place], [place] [address]          Radiology Studies: CT Head Wo Contrast  Result Date: 06/26/2020 CLINICAL  DATA:  Initial evaluation for acute neuro deficit, stroke suspected. EXAM: CT HEAD WITHOUT CONTRAST TECHNIQUE: Contiguous axial images were obtained from the base of the skull through the vertex without intravenous contrast. COMPARISON:  None available. 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 There is an irregular heterogeneous mass positioned at the right occipital cortex measuring 3.5 x 4.3 x 4.2 cm in greatest dimensions, most concerning for a primary CNS neoplasm. Associated hyperdensity suggests a degree of associated hemorrhage. Associated vasogenic edema throughout the posterior right parieto-occipital region with partial effacement of the posterior right lateral ventricle. No more than trace 2 mm right-to-left shift. Basilar cisterns remain patent. No other acute intracranial hemorrhage or large vessel territory infarct. No extra-axial fluid collection. Vascular: No hyperdense vessel. Scattered vascular calcifications noted within the carotid siphons. Skull: Scalp soft tissues and calvarium within normal limits. Sinuses/Orbits: Globes and orbital soft tissues demonstrate no acute finding. Paranasal sinuses are clear. No mastoid effusion. Other: None. IMPRESSION: 1. 3.5 x 4.3 x 4.2 cm irregular heterogeneous mass positioned at the right occipital cortex. Intrinsic hyperdensity suggests associated hemorrhage. Vasogenic edema throughout the posterior right parieto-occipital region with trace 2 mm right-to-left shift. Differential considerations include a primary CNS neoplasm versus solitary intracranial metastasis. 2. 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 Critical Value/emergent results were called by telephone at the time of interpretation on 06/26/2020 at 9:10 pm to provider [facility] , who verbally acknowledged these results. Electronically Signed   By: Jeannine Boga M.D.   On: 06/26/2020 21:13   MR BRAIN WO  CONTRAST  Result Date: 06/28/2020 CLINICAL DATA:  80 year old male with neurologic deficit, hemorrhage and edema in the right occipital lobe on noncontrast head CT. And enlarging right upper lobe lung masslike opacity on CT Chest, Abdomen, and Pelvis. Possible hemorrhagic metastasis or primary tumor. EXAM: MRI HEAD WITHOUT CONTRAST TECHNIQUE: Multiplanar, multiecho pulse sequences of the brain and surrounding structures were obtained without intravenous contrast. COMPARISON:  Head CT without contrast 06/26/2020. FINDINGS: The examination had to be discontinued prior to completion due to patient inability to continue, despite premedication. No contrast was administered. Axial DWI/DTI, along with T2, SWI and T1 weighted imaging was obtained. Brain: Hemorrhage related susceptibility artifact throughout the right occipital pole. Areas of T1 and T2 hyperintense blood products demonstrate diffusion restriction, but there is no larger area of restricted diffusion to strongly suggest acute infarction. Heterogeneous T1 and T2 blood products with decreased susceptibility throughout the  right occipital pole tracking to the right occipital horn encompass an area of 56 x 37 x 42 mm (AP by transverse by CC), for an estimated volume of 43 mL. Confluent vasogenic edema tracking superiorly and anteriorly from the hemorrhage with facilitated diffusion and appears stable from the recent CT. Stable regional mass effect. No midline shift at this time. Basilar cisterns remain patent. Small volume of right lateral intraventricular hemorrhage. No ventriculomegaly. Chronic lacunar infarct left thalamus. No definite additional intracranial hemosiderin, suspect artifact rather than superficial siderosis on SWI in the left perirolandic region on series 13, image 86. Cervicomedullary junction and pituitary are within normal limits. No other vasogenic edema is evident. No other suspicious brain lesion is evident on the provided sequences.  Vascular: Major intracranial vascular flow voids are preserved. Skull and upper cervical spine: Negative. Visualized bone marrow signal is within normal limits. Sinuses/Orbits: Stable, negative. Other: Mastoids are clear. IMPRESSION: 1. Truncated exam and motion degraded despite premedication and repeated imaging attempts. No contrast was administered. 2. Apparently solitary acute hemorrhage or hemorrhagic lesion in the right occipital pole encompassing 43 mL, indeterminate for tumor. Abundant regional vasogenic edema and mild mass effect stable from the CT. Trace right lateral IVH with no ventriculomegaly. 3. Chronic lacunar infarct left thalamus. Electronically Signed   By: Genevie Ann M.D.   On: 06/28/2020 05:52   CT CHEST ABDOMEN PELVIS W CONTRAST  Result Date: 06/28/2020 CLINICAL DATA:  Brain lesion, assess for metastatic disease EXAM: CT CHEST, ABDOMEN, AND PELVIS WITH CONTRAST TECHNIQUE: Multidetector CT imaging of the chest, abdomen and pelvis was performed following the standard protocol during bolus administration of intravenous contrast. CONTRAST:  147mL OMNIPAQUE IOHEXOL 300 MG/ML  SOLN COMPARISON:  CT angiography 05/27/2020, CT abdomen and pelvis 04/27/2012 FINDINGS: CT CHEST FINDINGS Cardiovascular: Normal heart size. No pericardial effusion. Coronary artery calcifications are present. The aortic root is suboptimally assessed given cardiac pulsation artifact. Atherosclerotic plaque within the normal caliber aorta and proximal great vessels. No acute luminal abnormality of the imaged aorta. No periaortic stranding or hemorrhage. Normal 3 vessel branching of the aortic arch. Proximal great vessels are free of acute abnormality. Central pulmonary arteries are normal caliber. Linear hypodense filling defect in the distal left main pulmonary artery at the bifurcation of the lingular and left lower lobar arteries (3/27) several additional smaller possible filling defects are seen pulmonary arteries of the  left lower lobe (3/34) and right lower lobe (3/31). Truncation of the right posterior segmental pulmonary artery as it enters the region of the heterogeneous attenuating masslike opacity in the right upper lobe. Additional narrowing of the adjacent pulmonary veins and hilar airways as well. Central pulmonary arteries remain normal caliber. RV/LV ratio is maintained. Mediastinum/Nodes: Mass within the right upper with extension along the right superior mediastinal margin extending into the right hilum and subcarinal space. There is associated narrowing of the superior hilar airways and vessels. A right superior hilar deposit measures up to 12 mm in size, previously 9 mm (3/23) increasing subcarinal extension as well narrowing the right mainstem bronchus with a subcarinal deposit measures approximately 2.3 cm on today's exam, previously 2.1 cm. No contralateral hilar adenopathy. The right lateral margin of the midthoracic esophagus is difficult to separate from this soft tissue mass. Lungs/Pleura: Large area of masslike opacity in the right upper lobe with extension along the right superior mediastinal margin and into the hilum and subcarinal space. This measures approximately 6.4 x 8 cm, previously 5.2 x 6.3 cm at similar levels.  There is decreasing central cavitation now with a slightly more hypoattenuating necrotic appearance on today's exam. Associated narrowing of the hilar airways and vessels including some occlusion of the upper lobe airways centrally. Surrounding ground-glass and consolidative opacity may reflect some postobstructive pneumonia change. Some additional more patchy ground-glass opacities in the right upper lobe periphery are similar to prior. Stable 4 mm subpleural nodules seen in the lateral and posterior right lower lobe. Remaining portions of the lungs are clear. No pneumothorax or pleural effusion. Minimal atelectasis. Musculoskeletal: No worrisome chest wall mass or lesion. No direct  extension into the chest wall or adjacent osseous structures by the upper lobe mass. Multilevel degenerative changes are present in the imaged portions of the spine. Exaggerated thoracic kyphosis with some degenerative vertebral body height loss and multilevel Schmorl's node formations. No acute osseous abnormality or suspicious osseous lesion. CT ABDOMEN PELVIS FINDINGS Hepatobiliary: No worrisome focal liver lesions. Smooth liver surface contour. Normal hepatic attenuation. Normal gallbladder and biliary tree. Pancreas: No pancreatic ductal dilatation or surrounding inflammatory changes. 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 Adrenals/Urinary Tract: Hypoattenuating nodule in the medial limb of the right adrenal gland again measures up to 1 cm in size, unchanged since 2014 and strongly favoring a benign process such as adenoma rather than metastatic lesion. No concerning right adrenal lesion. Stable appearance of the multiple fluid attenuation cyst in a slightly more intermediate attenuation exophytic cyst arising from the lower pole left kidney. Mild symmetric bilateral perinephric stranding, a nonspecific finding which may correlate with advanced age or decreased renal function. No concerning renal mass, obstructive urolith or hydronephrosis. Multiple coarse calcifications layering dependently within the bladder may reflect bladder calculi though these were not present on comparison imaging. There is however some chronic indentation of the urinary bladder by an enlarged, heterogeneous prostate gland. Stomach/Bowel: Distal esophagus, stomach and duodenal sweep are unremarkable. No small bowel wall thickening or dilatation. No evidence of obstruction. A normal appendix is visualized. No colonic dilatation or wall thickening. Vascular/Lymphatic:  Atherosclerotic calcifications within the abdominal aorta and branch vessels. No aneurysm or ectasia. No enlarged abdominopelvic lymph nodes. Reproductive: Heterogeneous, enlarged prostate with few typically benign prostate calcifications. Indentation of the bladder base. Other: No abdominopelvic free fluid or free gas. No bowel containing hernias. Musculoskeletal: Multilevel degenerative changes are present in the imaged portions of the spine. Additional degenerative changes in the hips and pelvis. The osseous structures appear diffusely demineralized which may limit detection of small or nondisplaced fractures. No acute osseous abnormality or suspicious osseous lesion. IMPRESSION: 1. Large area of masslike opacity in the right upper lobe with extension along the right superior mediastinal margin and into the right hilum and subcarinal space. This measures approximately 6.4 x 8 cm, previously 5.2 x 6.3 cm at similar levels. There is decreasing central cavitation now with a slightly more hypoattenuating necrotic appearance on today's exam. Findings are concerning for primary lung malignancy, particularly given the findings in the brain. Necrotic/atypical infection is less favored. Consider further evaluation with tissue sampling potentially via bronchoscopy. 2. Increasing subcarinal and right hilar extension with narrowing of the superior hilar airways and vessels including some occlusion of the right upper lobe airways. The right lateral margin of the midthoracic esophagus is difficult to separate from this soft tissue mass. 3. Some more peripheral areas of patchy consolidation interstitial opacity throughout the right upper lobe likely  reflects a postobstructive process 4. Linear hypodense filling defect in the distal left main pulmonary artery at the bifurcation of the lingular and left lower lobar arteries, several additional smaller possible filling defects are seen pulmonary arteries of the left lower lobe and  right lower lobe (3/34) and right lower lobe (3/31). Findings are concerning for pulmonary emboli. No CT evidence of right heart strain within limitations of this exam. 5. Multiple coarse calcifications layering dependently within the bladder may reflect bladder calculi though these were not present on comparison imaging. No obstructive urolithiasis or hydronephrosis. 6. Heterogeneous, enlarged prostate gland with some chronic indentation of the urinary bladder base. Could correlate for features of outlet obstruction with urinalysis were clinically appropriate. Furthermore recommend assessment with prostate exam and PSA as warranted. 7. No other convincing evidence of primary malignancy or metastatic disease within the abdomen or pelvis. Electronically Signed   By: Lovena Le M.D.   On: 06/28/2020 01:40   DG Chest Portable 1 View  Result Date: 06/26/2020 CLINICAL DATA:  Fever, chills, weakness, cough EXAM: PORTABLE CHEST 1 VIEW COMPARISON:  05/27/2020 FINDINGS: Consolidation again seen in the right upper lobe compatible with pneumonia. This has improved since prior study. No confluent opacity on the left. Heart is normal size. Aortic atherosclerosis. No effusions. No acute bony abnormality. IMPRESSION: Continued but improving right upper lobe consolidation/pneumonia. Electronically Signed   By: Rolm Baptise M.D.   On: 06/26/2020 20:27        Scheduled Meds: . atorvastatin  10 mg Oral q1800  . azithromycin  500 mg Oral Daily  . dexamethasone  4 mg Oral Q6H  . insulin aspart  0-5 Units Subcutaneous QHS  . insulin aspart  0-9 Units Subcutaneous TID WC  . insulin glargine  8 Units Subcutaneous Daily  . levETIRAcetam  500 mg Oral BID  . traZODone  50 mg Oral QHS   Continuous Infusions: . cefTRIAXone (ROCEPHIN)  IV 2 g (06/27/20 2145)     LOS: 2 days    Time spent: 45 minutes    Irine Seal, MD [facility]   To contact the attending provider between 7A-7P or the covering  provider during after hours 7P-7A, please log into the web site [email or web address] and access using universal [place] password for that web site. If you do not have the password, please call the hospital operator.  06/28/2020, 11:35 AM

## 2020-06-28 NOTE — Progress Notes (Signed)
Lower extremity venous study completed.  ? ?Please see CV Proc for preliminary results.  ? ?Quantia Grullon, RDMS, RVT ? ? ?

## 2020-06-29 DIAGNOSIS — G9389 Other specified disorders of brain: Secondary | ICD-10-CM | POA: Diagnosis not present

## 2020-06-29 LAB — CBC
HCT: 28.3 % — ABNORMAL LOW (ref 39.0–52.0)
Hemoglobin: 8.6 g/dL — ABNORMAL LOW (ref 13.0–17.0)
MCH: 22.9 pg — ABNORMAL LOW (ref 26.0–34.0)
MCHC: 30.4 g/dL (ref 30.0–36.0)
MCV: 75.3 fL — ABNORMAL LOW (ref 80.0–100.0)
Platelets: 655 10*3/uL — ABNORMAL HIGH (ref 150–400)
RBC: 3.76 MIL/uL — ABNORMAL LOW (ref 4.22–5.81)
RDW: 18.9 % — ABNORMAL HIGH (ref 11.5–15.5)
WBC: 16.4 10*3/uL — ABNORMAL HIGH (ref 4.0–10.5)
nRBC: 0.4 % — ABNORMAL HIGH (ref 0.0–0.2)

## 2020-06-29 LAB — BASIC METABOLIC PANEL
Anion gap: 5 (ref 5–15)
BUN: 18 mg/dL (ref 8–23)
CO2: 27 mmol/L (ref 22–32)
Calcium: 8.6 mg/dL — ABNORMAL LOW (ref 8.9–10.3)
Chloride: 104 mmol/L (ref 98–111)
Creatinine, Ser: 0.7 mg/dL (ref 0.61–1.24)
GFR, Estimated: >60 mL/min (ref >60)
Glucose, Bld: 234 mg/dL — ABNORMAL HIGH (ref 70–99)
Potassium: 4 mmol/L (ref 3.5–5.1)
Sodium: 136 mmol/L (ref 135–145)

## 2020-06-29 LAB — GLUCOSE, CAPILLARY
Glucose-Capillary: 297 mg/dL — ABNORMAL HIGH (ref 70–99)
Glucose-Capillary: 316 mg/dL — ABNORMAL HIGH (ref 70–99)
Glucose-Capillary: 215 mg/dL — ABNORMAL HIGH (ref 70–99)
Glucose-Capillary: 274 mg/dL — ABNORMAL HIGH (ref 70–99)

## 2020-06-29 LAB — MAGNESIUM: Magnesium: 2.3 mg/dL (ref 1.7–2.4)

## 2020-06-29 MED ORDER — INSULIN GLARGINE 100 UNIT/ML ~~LOC~~ SOLN
12.0000 [IU] | Freq: Every day | SUBCUTANEOUS | Status: DC
  Administered 2020-06-30 – 2020-07-01 (×2): 12 [IU] via SUBCUTANEOUS
  Filled 2020-06-29 (×2): qty 0.12

## 2020-06-29 MED ORDER — INSULIN ASPART 100 UNIT/ML ~~LOC~~ SOLN
0.0000 [IU] | Freq: Three times a day (TID) | SUBCUTANEOUS | Status: DC
  Administered 2020-06-29 – 2020-06-30 (×3): 5 [IU] via SUBCUTANEOUS

## 2020-06-29 NOTE — Plan of Care (Signed)

## 2020-06-29 NOTE — Progress Notes (Signed)
Inpatient Diabetes Program Recommendations  AACE/ADA: New Consensus Statement on Inpatient Glycemic Control (2015)  Target Ranges:  Prepandial:   less than 140 mg/dL      Peak postprandial:   less than 180 mg/dL (1-2 hours)      Critically ill patients:  140 - 180 mg/dL   Lab Results  Component Value Date   GLUCAP 297 (H) 06/29/2020   HGBA1C 6.4 (H) 06/27/2020    Review of Glycemic Control Results for Thomas Garcia, Thomas Garcia "ED" (MRN [id number]) as of 06/29/2020 11:21  Ref. Range 06/28/2020 08:45 06/28/2020 11:34 06/28/2020 15:26 06/28/2020 22:12 06/29/2020 08:30  Glucose-Capillary Latest Ref Range: 70 - 99 mg/dL 255 (H) 233 (H) 311 (H) 325 (H) 297 (H)   Diabetes history: DM2 Outpatient Diabetes medications: Metformin 500 mg bid Current orders for Inpatient glycemic control: Lantus 8 units qd + 0-9 units tid + 0-5 units hs + Decadron 4mg  q 6 hrs.  Inpatient Diabetes Program Recommendations:   Consider while in the hospital and on steroids: -Increase Novolog correction to 0-15 units tid + hs 0-5 units Secure chat sent to Dr. Thereasa Solo.  Thank you, Nani Gasser. Hanks, RN, MSN, CDE  Diabetes Coordinator Inpatient Glycemic Control Team Team Pager [phone] (8am-5pm) 06/29/2020 11:25 AM

## 2020-06-29 NOTE — Progress Notes (Signed)
Thomas Garcia  PYK:[id number] DOB: 09/06/1940 DOA: 06/26/2020 PCP: Doree Albee, MD    Brief Narrative:  80 year old with a history of HTN, DM 2, and PAD who presented to the ER with severe generalized weakness and fatigue, nausea, loss of appetite, and scant hemoptysis.  1 month prior he presented to the ER with shortness of breath and hemoptysis and was found to have small bilateral pulmonary emboli as well as a masslike consolidation in the right upper lobe with cavitation.  He refused admission at that time.  Upon his return to the ER for this admission a CT head revealed evidence of a right occipital mass.   Antimicrobials:  Rocephin 3/21 >  DVT prophylaxis: SCDs  Consultants:  Pulmonary Neurosurgery  Subjective: Resting comfortably in bed.  Has no new complaints.  Tells me he is anxious to "get things going."-Chest pain shortness of breath or hemoptysis.  The patient, his son, and his brother and I am had a very lengthy discussion at the bedside during which I explained our plan for his ongoing care.  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 Being treated with empiric antibiotic for possible postobstructive pneumonia -pulmonary following -Plavix on hold for planned biopsy -if develops gross hemoptysis will need IR directed embolization  Right occipital mass -suspected metastatic lung cancer Complicated by mild hemorrhage and vasogenic edema resulting in a 2 mm right-to-left shift -neurosurgery has evaluated -continue Decadron and empiric Keppra -possible need for craniotomy -to have MRI under anesthesia tomorrow morning for planning  Pulmonary embolism Anticoagulation on hold in setting of hemoptysis and bleeding from brain mass -no evidence of DVT on bilateral lower extremity venous duplex  Chronic iron deficiency anemia Likely a consequence of his previously undiagnosed cancer  PAD Status post left BKA -Plavix necessarily on hold due to  above -continue statin  HTN Blood pressure reasonably controlled at present  DM 2 A1c 6.4 -CBG poorly controlled during inpatient stay due to use of Decadron -adjust treatment and follow  GERD Well compensated  Hyponatremia Corrected  Code Status: FULL CODE Family Communication: Spoke at length with son and brother at bedside Status is: Inpatient  Remains inpatient appropriate because:Inpatient level of care appropriate due to severity of illness   Dispo: The patient is from: Home              Anticipated d/c is to: unclear              Patient currently is not medically stable to d/c.   Difficult to place patient No     Objective: Blood pressure (!) 152/73, pulse 74, temperature 97.7 F (36.5 C), temperature source Oral, resp. rate 19, height 5\' 9"  (1.753 m), weight 81.6 kg, SpO2 99 %.  Intake/Output Summary (Last 24 hours) at 06/29/2020 0917 Last data filed at 06/29/2020 5397 Gross per 24 hour  Intake 1531.68 ml  Output 600 ml  Net 931.68 ml   Filed Weights   06/26/20 1856  Weight: 81.6 kg    Examination: General: No acute respiratory distress Lungs: Clear to auscultation bilaterally without wheezes or crackles Cardiovascular: Regular rate and rhythm without murmur gallop or rub normal S1 and S2 Abdomen: Nontender, nondistended, soft, bowel sounds positive, no rebound, no ascites, no appreciable mass Extremities: No significant cyanosis, clubbing, or edema bilateral lower extremities  CBC: Recent Labs  Lab 06/26/20 2045 06/27/20 0529 06/28/20 0153 06/28/20 1024 06/28/20 1948 06/29/20 0306  WBC 12.8* 12.4* 10.8*  --   --  16.4*  NEUTROABS 10.3*  --   --   --   --   --   HGB 7.8* 7.0* 7.1* 7.5* 8.3* 8.6*  HCT 27.7* 24.7* 24.8* 25.3* 27.8* 28.3*  MCV 76.3* 75.1* 74.0*  --   --  75.3*  PLT 745* 666* 683*  --   --  427*   Basic Metabolic Panel: Recent Labs  Lab 06/26/20 2047 06/27/20 0529 06/28/20 0153 06/29/20 0306  NA 132* 133* 131* 136  K 4.0  4.4 3.8 4.0  CL 98 102 100 104  CO2 22 22 22 27   GLUCOSE 157* 166* 288* 234*  BUN 23 26* 24* 18  CREATININE 1.01 0.90 0.85 0.70  CALCIUM 8.7* 8.4* 8.5* 8.6*  MG 2.1  --   --  2.3   GFR: Estimated Creatinine Clearance: 77.3 mL/min (by C-G formula based on SCr of 0.7 mg/dL).  Liver Function Tests: Recent Labs  Lab 06/26/20 2047  AST 12*  ALT 11  ALKPHOS 74  BILITOT 0.4  PROT 7.5  ALBUMIN 3.3*    Coagulation Profile: Recent Labs  Lab 06/26/20 2047  INR 1.1    HbA1C: Hgb A1c MFr Bld  Date/Time Value Ref Range Status  06/27/2020 05:29 AM 6.4 (H) 4.8 - 5.6 % Final    Comment:    (NOTE) Pre diabetes:          5.7%-6.4%  Diabetes:              >6.4%  Glycemic control for   <7.0% adults with diabetes   04/14/2020 12:00 AM 5.6 <5.7 % of total Hgb Final    Comment:    For the purpose of screening for the presence of diabetes: . <5.7%       Consistent with the absence of diabetes 5.7-6.4%    Consistent with increased risk for diabetes             (prediabetes) > or =6.5%  Consistent with diabetes . This assay result is consistent with a decreased risk of diabetes. . Currently, no consensus exists regarding use of hemoglobin A1c for diagnosis of diabetes in children. . According to American Diabetes Association (ADA) guidelines, hemoglobin A1c <7.0% represents optimal control in non-pregnant diabetic patients. Different metrics may apply to specific patient populations.  Standards of Medical Care in Diabetes(ADA). .     CBG: Recent Labs  Lab 06/28/20 0845 06/28/20 1134 06/28/20 1526 06/28/20 2212 06/29/20 0830  GLUCAP 255* 233* 311* 325* 297*    Recent Results (from the past 240 hour(s))  Resp Panel by RT-PCR (Flu A&B, Covid) Nasopharyngeal Swab     Status: None   Collection Time: 06/26/20 11:18 PM   Specimen: Nasopharyngeal Swab; Nasopharyngeal(NP) swabs in vial transport medium  Result Value Ref Range Status   SARS Coronavirus 2 by RT PCR  NEGATIVE NEGATIVE Final    Comment: (NOTE) SARS-CoV-2 target nucleic acids are NOT DETECTED.  The SARS-CoV-2 RNA is generally detectable in upper respiratory specimens during the acute phase of infection. The lowest concentration of SARS-CoV-2 viral copies this assay can detect is 138 copies/mL. A negative result does not preclude SARS-Cov-2 infection and should not be used as the sole basis for treatment or other patient management decisions. A negative result may occur with  improper specimen collection/handling, submission of specimen other than nasopharyngeal swab, presence of viral mutation(s) within the areas targeted by this assay, and inadequate number of viral copies(<138 copies/mL). A negative result must be combined with clinical observations, patient  history, and epidemiological information. The expected result is Negative.  Fact Sheet for Patients:  EntrepreneurPulse.com.au  Fact Sheet for Healthcare Providers:  IncredibleEmployment.be  This test is no t yet approved or cleared by the [place] FDA and  has been authorized for detection and/or diagnosis of SARS-CoV-2 by FDA under an Emergency Use Authorization (EUA). This EUA will remain  in effect (meaning this test can be used) for the duration of the COVID-19 declaration under Section 564(b)(1) of the Act, 21 U.S.C.section 360bbb-3(b)(1), unless the authorization is terminated  or revoked sooner.       Influenza A by PCR NEGATIVE NEGATIVE Final   Influenza B by PCR NEGATIVE NEGATIVE Final    Comment: (NOTE) The Xpert Xpress SARS-CoV-2/FLU/RSV plus assay is intended as an aid in the diagnosis of influenza from Nasopharyngeal swab specimens and should not be used as a sole basis for treatment. Nasal washings and aspirates are unacceptable for Xpert Xpress SARS-CoV-2/FLU/RSV testing.  Fact Sheet for Patients: EntrepreneurPulse.com.au  Fact Sheet for  Healthcare Providers: IncredibleEmployment.be  This test is not yet approved or cleared by the [place] FDA and has been authorized for detection and/or diagnosis of SARS-CoV-2 by FDA under an Emergency Use Authorization (EUA). This EUA will remain in effect (meaning this test can be used) for the duration of the COVID-19 declaration under Section 564(b)(1) of the Act, 21 U.S.C. section 360bbb-3(b)(1), unless the authorization is terminated or revoked.  Performed at [facility], [address], [place], [place] [address]   MRSA PCR Screening     Status: None   Collection Time: 06/27/20  3:58 PM   Specimen: Nasopharyngeal  Result Value Ref Range Status   MRSA by PCR NEGATIVE NEGATIVE Final    Comment:        The GeneXpert MRSA Assay (FDA approved for NASAL specimens only), is one component of a comprehensive MRSA colonization surveillance program. It is not intended to diagnose MRSA infection nor to guide or monitor treatment for MRSA infections. Performed at [facility], [place] [address] [place], [place] [address]      Scheduled Meds: . atorvastatin  10 mg Oral q1800  . azithromycin  500 mg Oral Daily  . dexamethasone  4 mg Oral Q6H  . insulin aspart  0-5 Units Subcutaneous QHS  . insulin aspart  0-9 Units Subcutaneous TID WC  . insulin glargine  8 Units Subcutaneous Daily  . levETIRAcetam  500 mg Oral BID  . pantoprazole  40 mg Oral Q0600  . traZODone  50 mg Oral QHS   Continuous Infusions: . cefTRIAXone (ROCEPHIN)  IV 2 g (06/28/20 2234)     LOS: 3 days   Cherene Altes, MD [facility] Office  [phone] Pager - Text Page per Amion  If 7PM-7AM, please contact night-coverage per Amion 06/29/2020, 9:17 AM

## 2020-06-30 ENCOUNTER — Inpatient Hospital Stay (HOSPITAL_COMMUNITY): Payer: Medicare HMO | Admitting: Certified Registered Nurse Anesthetist

## 2020-06-30 ENCOUNTER — Encounter (HOSPITAL_COMMUNITY): Payer: Self-pay | Admitting: Family Medicine

## 2020-06-30 ENCOUNTER — Inpatient Hospital Stay (HOSPITAL_COMMUNITY): Payer: Medicare HMO

## 2020-06-30 ENCOUNTER — Encounter (HOSPITAL_COMMUNITY)
Admission: EM | Disposition: A | Discharge status: Home Health | Payer: Self-pay | Source: Home / Self Care | Attending: Internal Medicine

## 2020-06-30 DIAGNOSIS — G9389 Other specified disorders of brain: Secondary | ICD-10-CM | POA: Diagnosis not present

## 2020-06-30 HISTORY — PX: RADIOLOGY WITH ANESTHESIA: SHX6223

## 2020-06-30 LAB — GLUCOSE, CAPILLARY
Glucose-Capillary: 241 mg/dL — ABNORMAL HIGH (ref 70–99)
Glucose-Capillary: 242 mg/dL — ABNORMAL HIGH (ref 70–99)
Glucose-Capillary: 214 mg/dL — ABNORMAL HIGH (ref 70–99)
Glucose-Capillary: 338 mg/dL — ABNORMAL HIGH (ref 70–99)
Glucose-Capillary: 256 mg/dL — ABNORMAL HIGH (ref 70–99)

## 2020-06-30 IMAGING — MR MR HEAD WO/W CM
14 of 15 series · 45 of 48 positions shown · IV contrast (Y GAD)
Comparison: MRI of the brain [DATE].

CLINICAL DATA: Brain/CNS neoplasm, staging.

EXAM:
MRI HEAD WITHOUT AND WITH CONTRAST
TECHNIQUE: Multiplanar, multiecho pulse sequences of the brain and surrounding
structures were obtained without and with intravenous contrast.
CONTRAST:  8mL GADAVIST GADOBUTROL 1 MMOL/ML IV SOLN

[Series 2: FLAIR · sagittal · 3.0mm · 0.55mm/px · 1 of 51 slices shown]
[im 1/51]
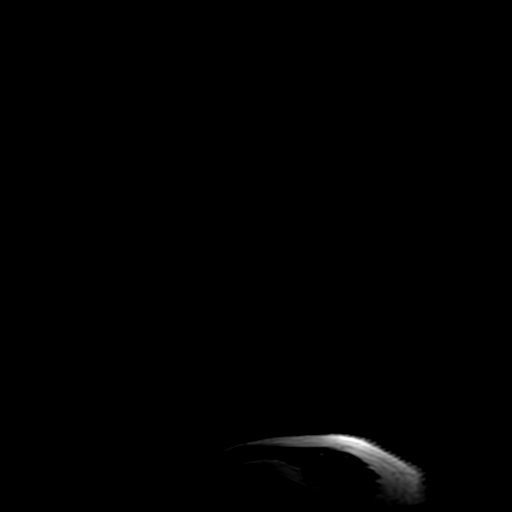

[Series 3: T2 · axial · 5.0mm · 0.51mm/px · 1 of 33 slices shown]
[im 1/33]
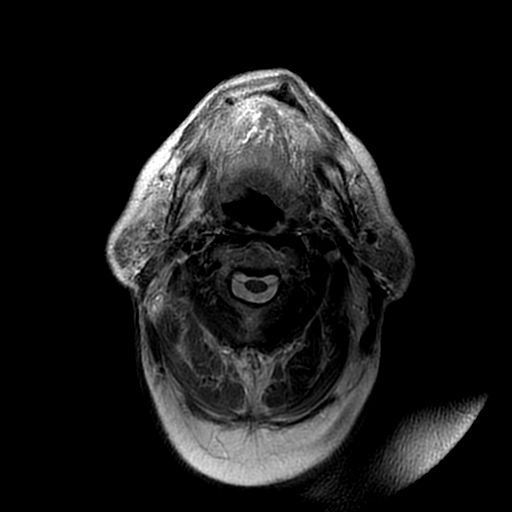

[Series 4: ax dti · axial · 3.0mm · 1.02mm/px · z∈[-23,+152]mm · 15 of 1664 slices shown]
[im 1/1664]
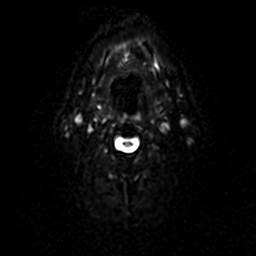
[im 119/1664]
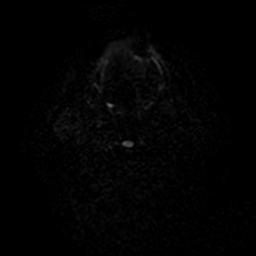
[im 238/1664]
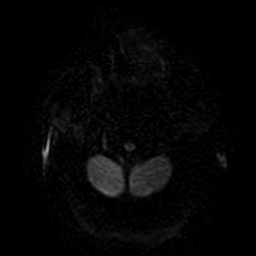
[im 357/1664]
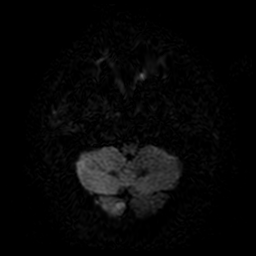
[im 476/1664]
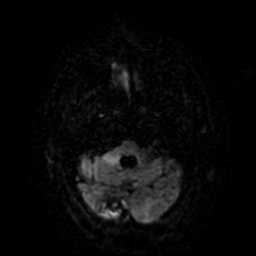
[im 594/1664]
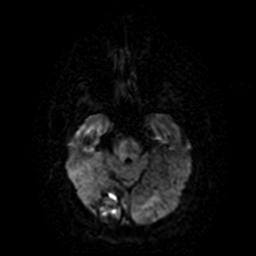
[im 713/1664]
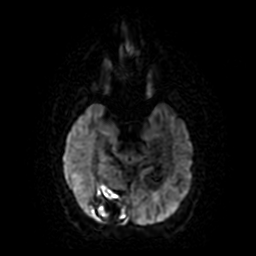
[im 832/1664]
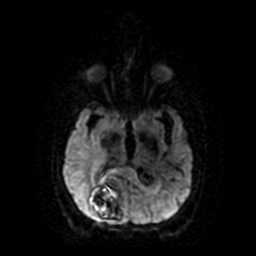
[im 951/1664]
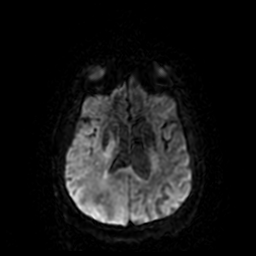
[im 1070/1664]
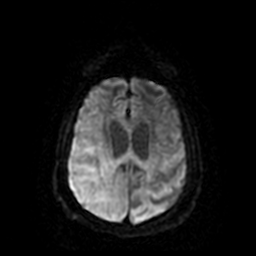
[im 1188/1664]
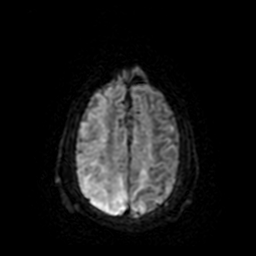
[im 1307/1664]
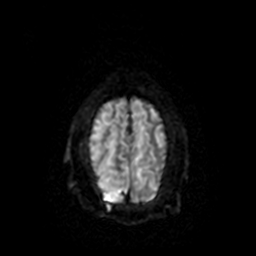
[im 1426/1664]
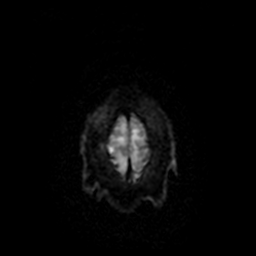
[im 1545/1664]
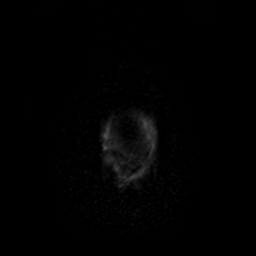
[im 1664/1664]
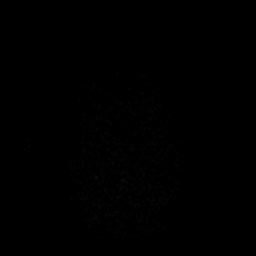

[Series 6: (person_name) · axial · 3.0mm · 0.51mm/px · 1 of 146 slices shown]
[im 1/146]
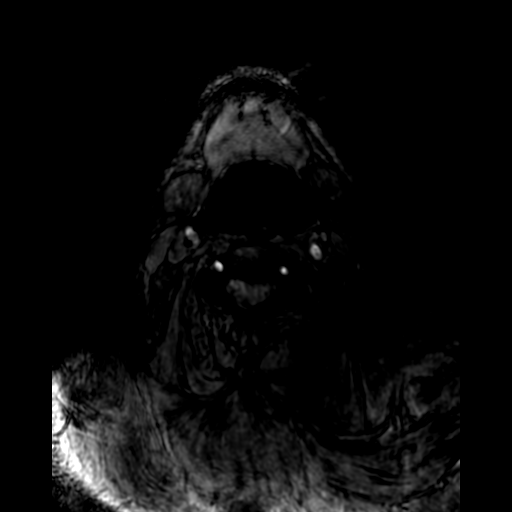

[Series 7: ax 3(person_name) · axial · 1.0mm · 1.02mm/px · z∈[-110,+95]mm · 2 of 206 slices shown (1 of 2)]
[im 1/206]
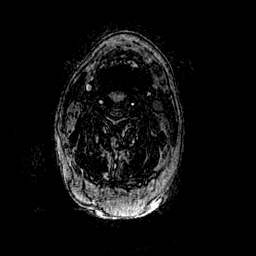
[im 206/206]
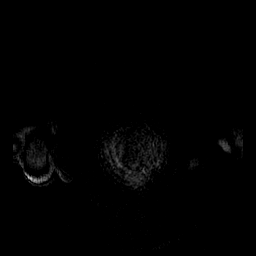

[Series 8: T2 post-contrast · coronal · 3.0mm · 0.47mm/px · 1 of 85 slices shown]
[im 1/85]
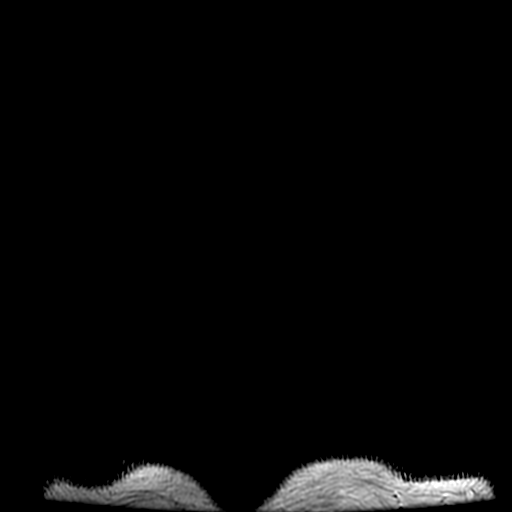

[Series 9: ax 3(person_name) · axial · 1.0mm · 1.02mm/px · z∈[-110,+95]mm · 2 of 206 slices shown (2 of 2)]
[im 1/206]
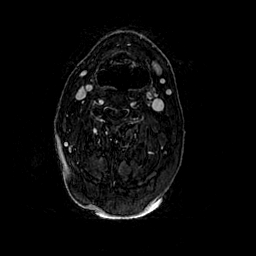
[im 206/206]
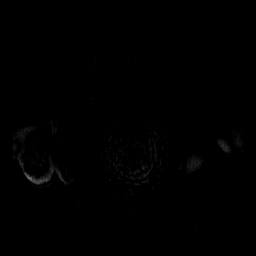

[Series 10: T1 · coronal · 3.0mm · 0.47mm/px · 1 of 84 slices shown]
[im 1/84]
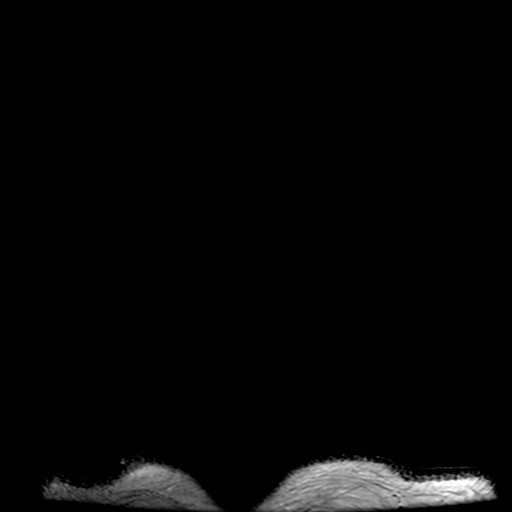

[Series 11: FLAIR post-contrast · sagittal · 3.0mm · 0.55mm/px · 1 of 51 slices shown]
[im 1/51]
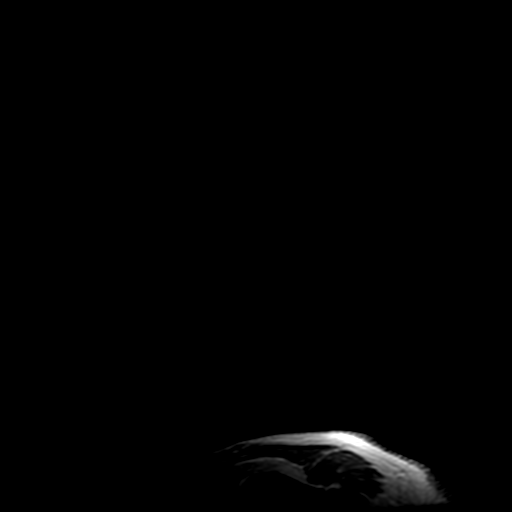

[Series 410: orig: ax dti · axial · 3.0mm · 1.02mm/px · z∈[-23,+152]mm · 16 of 1663 slices shown]
[im 1/1663]
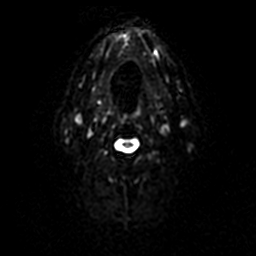
[im 111/1663]
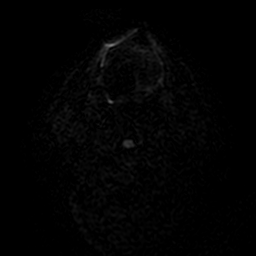
[im 222/1663]
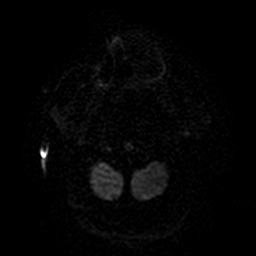
[im 333/1663]
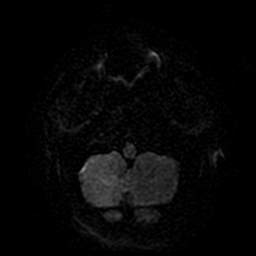
[im 444/1663]
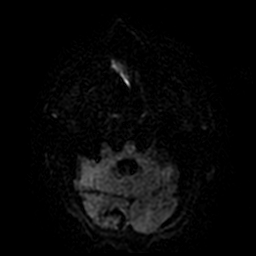
[im 555/1663]
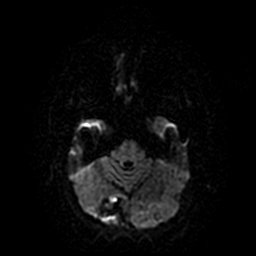
[im 665/1663]
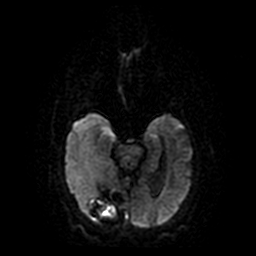
[im 776/1663]
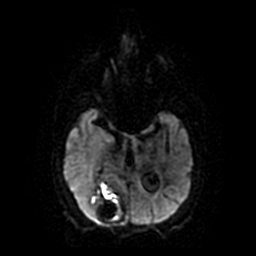
[im 887/1663]
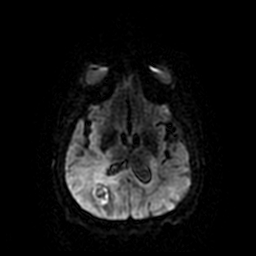
[im 998/1663]
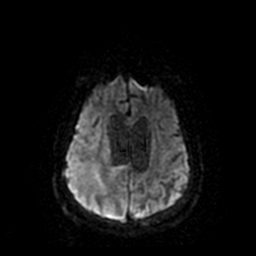
[im 1109/1663]
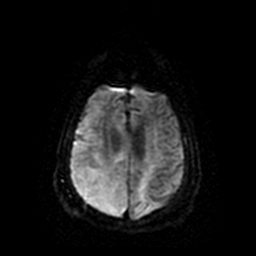
[im 1219/1663]
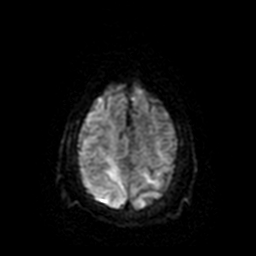
[im 1330/1663]
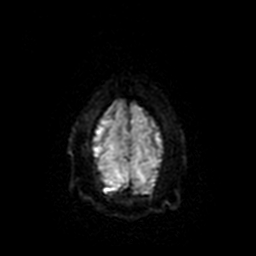
[im 1441/1663]
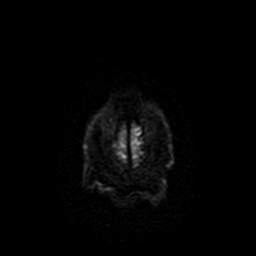
[im 1552/1663]
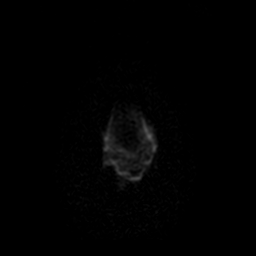
[im 1663/1663]
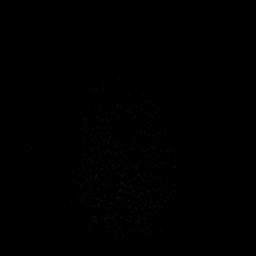

[Series 450: trace · axial · 3.0mm · 1.02mm/px · 1 of 62 slices shown]
[im 1/62]
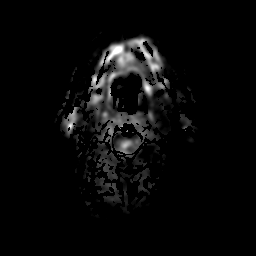

[Series 451: fa(no-q) · axial · 3.0mm · 1.02mm/px · 1 of 59 slices shown]
[im 1/59]
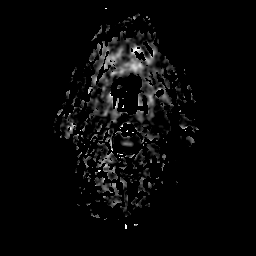

[Series 452: avdc (10^-6 mm²/s)(no-q) · axial · 3.0mm · 1.02mm/px · 1 of 62 slices shown]
[im 1/62]
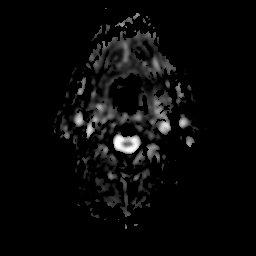

[Series 600: filt_pha: (person_name) · axial · 3.0mm · 0.51mm/px · 1 of 146 slices shown]
[im 1/146]
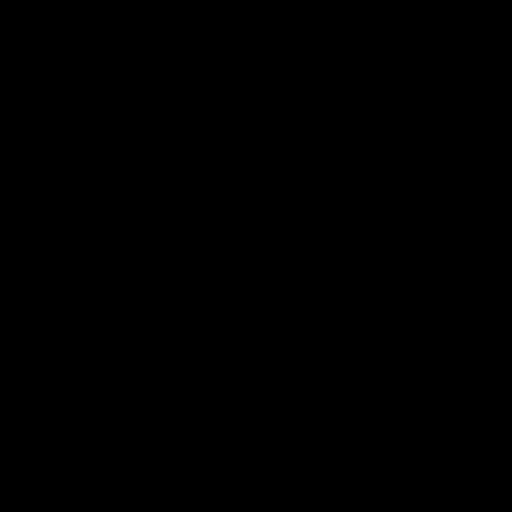

[45 of 48 positions shown; findings below may reference images not displayed]

FINDINGS: Brain: Redemonstrated large hemorrhagic lesion in the right
occipital lobe measuring approximately 6.0 x 1.5 x 3.7 cm, not
significantly changed from prior MRI. The lesion shows thin
peripheral multilobular contrast enhancement extending from the
cortical surface to the right atrium subependymal region with
extension of the hemorrhage into the right lateral ventricle.
Prominent vasogenic edema surrounding the lesion and extending into
the right parietal and temporal lobes with mass effect on the
occipital horn and atrium of the right lateral ventricle. No
hydrocephalus or significant midline shift. No other enhancing
lesion identified.

Scattered foci of T2 hyperintensity are seen within the white matter
of the cerebral hemispheres, nonspecific, most likely related to
chronic small vessel ischemia.

Vascular: Normal flow voids.

Skull and upper cervical spine: Normal marrow signal.

Sinuses/Orbits: Negative.
IMPRESSION: Large multilobulated hemorrhagic lesion in the right occipital lobe
with thin peripheral contrast enhancement is favored to represent a
hemorrhagic metastasis given known large pulmonary mass.

## 2020-06-30 SURGERY — MRI WITH ANESTHESIA
Anesthesia: General

## 2020-06-30 MED ORDER — GADOBUTROL 1 MMOL/ML IV SOLN
8.0000 mL | Freq: Once | INTRAVENOUS | Status: AC | PRN
  Administered 2020-06-30: 8 mL via INTRAVENOUS

## 2020-06-30 MED ORDER — DEXAMETHASONE 4 MG PO TABS
4.0000 mg | ORAL_TABLET | Freq: Four times a day (QID) | ORAL | Status: DC
  Administered 2020-06-30 – 2020-07-11 (×42): 4 mg via ORAL
  Filled 2020-06-30 (×41): qty 1

## 2020-06-30 MED ORDER — PROPOFOL 10 MG/ML IV BOLUS
INTRAVENOUS | Status: DC | PRN
  Administered 2020-06-30: 160 mg via INTRAVENOUS

## 2020-06-30 MED ORDER — LIDOCAINE 2% (20 MG/ML) 5 ML SYRINGE
INTRAMUSCULAR | Status: DC | PRN
  Administered 2020-06-30: 60 mg via INTRAVENOUS

## 2020-06-30 MED ORDER — DEXAMETHASONE SODIUM PHOSPHATE 4 MG/ML IJ SOLN
4.0000 mg | Freq: Once | INTRAMUSCULAR | Status: AC
  Administered 2020-06-30: 4 mg via INTRAVENOUS
  Filled 2020-06-30: qty 1

## 2020-06-30 MED ORDER — PANTOPRAZOLE SODIUM 40 MG IV SOLR
40.0000 mg | Freq: Once | INTRAVENOUS | Status: AC
  Administered 2020-06-30: 40 mg via INTRAVENOUS
  Filled 2020-06-30: qty 40

## 2020-06-30 MED ORDER — DEXAMETHASONE SODIUM PHOSPHATE 10 MG/ML IJ SOLN
INTRAMUSCULAR | Status: DC | PRN
  Administered 2020-06-30: 5 mg via INTRAVENOUS

## 2020-06-30 MED ORDER — LACTATED RINGERS IV SOLN: INTRAVENOUS | Status: DC | PRN

## 2020-06-30 MED ORDER — PANTOPRAZOLE SODIUM 40 MG PO TBEC
40.0000 mg | DELAYED_RELEASE_TABLET | Freq: Every day | ORAL | Status: DC
  Administered 2020-07-01 – 2020-07-14 (×13): 40 mg via ORAL
  Filled 2020-06-30 (×13): qty 1

## 2020-06-30 MED ORDER — ONDANSETRON HCL 4 MG/2ML IJ SOLN
INTRAMUSCULAR | Status: DC | PRN
  Administered 2020-06-30: 4 mg via INTRAVENOUS

## 2020-06-30 MED ORDER — INSULIN ASPART 100 UNIT/ML ~~LOC~~ SOLN
0.0000 [IU] | Freq: Three times a day (TID) | SUBCUTANEOUS | Status: DC
  Administered 2020-06-30: 15 [IU] via SUBCUTANEOUS
  Administered 2020-07-01: 11 [IU] via SUBCUTANEOUS
  Administered 2020-07-01: 15 [IU] via SUBCUTANEOUS
  Administered 2020-07-01 – 2020-07-02 (×2): 7 [IU] via SUBCUTANEOUS
  Administered 2020-07-02: 4 [IU] via SUBCUTANEOUS
  Administered 2020-07-02: 3 [IU] via SUBCUTANEOUS
  Administered 2020-07-03: 4 [IU] via SUBCUTANEOUS
  Administered 2020-07-03: 7 [IU] via SUBCUTANEOUS
  Administered 2020-07-03 – 2020-07-04 (×3): 11 [IU] via SUBCUTANEOUS
  Administered 2020-07-04: 15 [IU] via SUBCUTANEOUS
  Administered 2020-07-05: 7 [IU] via SUBCUTANEOUS
  Administered 2020-07-05: 4 [IU] via SUBCUTANEOUS
  Administered 2020-07-05: 11 [IU] via SUBCUTANEOUS
  Administered 2020-07-06: 7 [IU] via SUBCUTANEOUS
  Administered 2020-07-06 – 2020-07-07 (×3): 11 [IU] via SUBCUTANEOUS
  Administered 2020-07-07: 20 [IU] via SUBCUTANEOUS
  Administered 2020-07-07 – 2020-07-14 (×6): 4 [IU] via SUBCUTANEOUS
  Administered 2020-07-14 (×2): 7 [IU] via SUBCUTANEOUS

## 2020-06-30 NOTE — Care Management Important Message (Signed)
Important Message  Patient Details  Name: NICKOLAI RINKS MRN: [id number] Date of Birth: 1940/09/29   Medicare Important Message Given:  Yes     Orbie Pyo 06/30/2020, 2:52 PM

## 2020-06-30 NOTE — Anesthesia Postprocedure Evaluation (Signed)
Anesthesia Post Note  Patient: Thomas Garcia  Procedure(s) Performed: MRI WITH ANESTHESIA   BRAIN WITH AND WITHOUT CONTRAST (N/A )     Patient location during evaluation: PACU Anesthesia Type: General Level of consciousness: awake and alert Pain management: pain level controlled Vital Signs Assessment: post-procedure vital signs reviewed and stable Respiratory status: spontaneous breathing, nonlabored ventilation and respiratory function stable Cardiovascular status: blood pressure returned to baseline and stable Postop Assessment: no apparent nausea or vomiting Anesthetic complications: no   No complications documented.  Last Vitals:  Vitals:   06/30/20 0745 06/30/20 1405  BP: (!) 148/63 (!) 156/77  Pulse: 61 72  Resp: 16 20  Temp: (!) 36.4 C (!) 36.2 C  SpO2: 97% 100%    Last Pain:  Vitals:   06/30/20 1405  TempSrc:   PainSc: 0-No pain                 Lidia Collum

## 2020-06-30 NOTE — Anesthesia Procedure Notes (Signed)
Procedure Name: LMA Insertion Date/Time: 06/30/2020 12:35 PM Performed by: Janene Harvey, CRNA Pre-anesthesia Checklist: Patient identified, Emergency Drugs available, Suction available and Patient being monitored Patient Re-evaluated:Patient Re-evaluated prior to induction Oxygen Delivery Method: Circle system utilized Preoxygenation: Pre-oxygenation with 100% oxygen Induction Type: IV induction LMA: LMA inserted LMA Size: 4.0 Placement Confirmation: positive ETCO2 Dental Injury: Teeth and Oropharynx as per pre-operative assessment

## 2020-06-30 NOTE — Progress Notes (Signed)
OT Cancellation Note  Patient Details Name: Thomas Garcia MRN: [id number] DOB: 1941-03-30   Cancelled Treatment:    Reason Eval/Treat Not Completed: Patient at procedure or test/ unavailable.  Pt in MRI with sedation.  Will check back.  Nilsa Nutting., OTR/L Acute Rehabilitation Services Pager [phone] Office [phone]   Lucille Passy M 06/30/2020, 11:08 AM

## 2020-06-30 NOTE — Anesthesia Preprocedure Evaluation (Signed)
Anesthesia Evaluation  Patient identified by MRN, date of birth, ID band Patient awake    Reviewed: Allergy & Precautions, NPO status , Patient's Chart, lab work & pertinent test results  History of Anesthesia Complications Negative for: history of anesthetic complications  Airway Mallampati: II  TM Distance: >3 FB Neck ROM: Full    Dental  (+) Missing, Chipped, Poor Dentition   Pulmonary Patient abstained from smoking., former smoker, PE Lung mass   Pulmonary exam normal        Cardiovascular hypertension, + Peripheral Vascular Disease  Normal cardiovascular exam     Neuro/Psych R occipital mass    GI/Hepatic Neg liver ROS, GERD  ,  Endo/Other  diabetes, Type 2, Oral Hypoglycemic Agents  Renal/GU negative Renal ROS  negative genitourinary   Musculoskeletal negative musculoskeletal ROS (+)   Abdominal   Peds  Hematology  (+) anemia , 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   Anesthesia Other Findings  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  Echo 09/09/18: EF 60-65%, mild LVH, normal RV function, valve unremarkable  Reproductive/Obstetrics                             Anesthesia Physical Anesthesia Plan  ASA: III  Anesthesia Plan: General   Post-op Pain Management:    Induction: Intravenous  PONV Risk Score and Plan: 2 and Ondansetron, Dexamethasone, Midazolam and Treatment may vary due to age or medical condition  Airway Management Planned: LMA  Additional Equipment: None  Intra-op Plan:   Post-operative Plan: Extubation in OR  Informed Consent: I have reviewed the patients History and Physical, chart, labs and discussed the procedure including the risks, benefits and alternatives for the proposed anesthesia with the patient or authorized representative who has indicated his/her understanding and acceptance.     Dental advisory given  Plan Discussed with:   Anesthesia Plan  Comments:         Anesthesia Quick Evaluation

## 2020-06-30 NOTE — Progress Notes (Signed)
PT Cancellation Note  Patient Details Name: Thomas Garcia MRN: [id number] DOB: 17-Dec-1940   Cancelled Treatment:    Reason Eval/Treat Not Completed: Patient at procedure or test/unavailable this morning (at MRI and sedated with anesthesia for MRI). PT will continue to follow and evaluate as appropriate.   Hardie Pulley, DPT   Acute Rehabilitation Department Pager #: [phone]   Otho Bellows 06/30/2020, 11:18 AM

## 2020-06-30 NOTE — Plan of Care (Signed)

## 2020-06-30 NOTE — Progress Notes (Signed)
Thomas Garcia  IOE:[id number] DOB: 12-28-1940 DOA: 06/26/2020 PCP: Doree Albee, MD    Brief Narrative:  80yo with a history of HTN, DM 2, and PAD who presented to the ER with severe generalized weakness and fatigue, nausea, loss of appetite, and scant hemoptysis. 1 month prior he presented to an ER with shortness of breath and hemoptysis and was found to have small bilateral pulmonary emboli as well as a masslike consolidation in the right upper lobe with cavitation.  He refused admission at that time.  Upon his return to the ER for this admission a CT head revealed evidence of a right occipital mass.  Antimicrobials:  Rocephin 3/21 >  DVT prophylaxis: SCDs  Consultants:  Pulmonary Neurosurgery  Subjective: Afebrile.  Vital signs stable with blood pressure mildly elevated.  Oxygen saturations upper 90s on room air.  CBG remains poorly controlled.  Patient resting comfortably on the stretcher in the postanesthesia care unit with no new complaints.  He is highly motivated to get home as soon as possible.  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 Being treated with empiric antibiotic for possible postobstructive pneumonia -pulmonary following -Plavix on hold for planned biopsy -if develops gross hemoptysis will need IR directed embolization  Right occipital mass -suspected metastatic lung cancer Complicated by mild hemorrhage and vasogenic edema resulting in a 2 mm right-to-left shift -neurosurgery has evaluated -continue Decadron and empiric Keppra -planning probable craniotomy - MRI under anesthesia today  Pulmonary embolism Anticoagulation on hold in setting of hemoptysis and bleeding from brain mass -no evidence of DVT on bilateral lower extremity venous duplex  Chronic iron deficiency anemia Likely a consequence of his previously undiagnosed cancer  PAD Status post left BKA -Plavix necessarily on hold due to above -continue  statin  HTN Blood pressure reasonably controlled at present  Uncontrolled DM 2 w/ hyperglycemia A1c 6.4 - CBG poorly controlled during inpatient stay due to use of Decadron -adjust treatment again today and follow  GERD Well compensated  Hyponatremia Corrected  Code Status: FULL CODE Family Communication:  Status is: Inpatient  Remains inpatient appropriate because:Inpatient level of care appropriate due to severity of illness   Dispo: The patient is from: Home              Anticipated d/c is to: unclear              Patient currently is not medically stable to d/c.   Difficult to place patient No     Objective: Blood pressure (!) 148/63, pulse 61, temperature (!) 97.5 F (36.4 C), temperature source Oral, resp. rate 16, height 5\' 9"  (1.753 m), weight 81.6 kg, SpO2 97 %.  Intake/Output Summary (Last 24 hours) at 06/30/2020 0925 Last data filed at 06/30/2020 0750 Gross per 24 hour  Intake -  Output 800 ml  Net -800 ml   Filed Weights   06/26/20 1856  Weight: 81.6 kg    Examination: General: No acute respiratory distress Lungs: Good air movement bilaterally with no wheezing Cardiovascular: RRR without murmur Abdomen: NT/ND, soft, BS positive, no rebound Extremities: No significant cyanosis, clubbing, or edema B LE  CBC: Recent Labs  Lab 06/26/20 2045 06/27/20 0529 06/28/20 0153 06/28/20 1024 06/28/20 1948 06/29/20 0306  WBC 12.8* 12.4* 10.8*  --   --  16.4*  NEUTROABS 10.3*  --   --   --   --   --   HGB 7.8* 7.0* 7.1* 7.5* 8.3* 8.6*  HCT 27.7* 24.7* 24.8* 25.3* 27.8* 28.3*  MCV 76.3* 75.1* 74.0*  --   --  75.3*  PLT 745* 666* 683*  --   --  500*   Basic Metabolic Panel: Recent Labs  Lab 06/26/20 2047 06/27/20 0529 06/28/20 0153 06/29/20 0306  NA 132* 133* 131* 136  K 4.0 4.4 3.8 4.0  CL 98 102 100 104  CO2 22 22 22 27   GLUCOSE 157* 166* 288* 234*  BUN 23 26* 24* 18  CREATININE 1.01 0.90 0.85 0.70  CALCIUM 8.7* 8.4* 8.5* 8.6*  MG 2.1  --    --  2.3   GFR: Estimated Creatinine Clearance: 77.3 mL/min (by C-G formula based on SCr of 0.7 mg/dL).  Liver Function Tests: Recent Labs  Lab 06/26/20 2047  AST 12*  ALT 11  ALKPHOS 74  BILITOT 0.4  PROT 7.5  ALBUMIN 3.3*    Coagulation Profile: Recent Labs  Lab 06/26/20 2047  INR 1.1    HbA1C: Hgb A1c MFr Bld  Date/Time Value Ref Range Status  06/27/2020 05:29 AM 6.4 (H) 4.8 - 5.6 % Final    Comment:    (NOTE) Pre diabetes:          5.7%-6.4%  Diabetes:              >6.4%  Glycemic control for   <7.0% adults with diabetes   04/14/2020 12:00 AM 5.6 <5.7 % of total Hgb Final    Comment:    For the purpose of screening for the presence of diabetes: . <5.7%       Consistent with the absence of diabetes 5.7-6.4%    Consistent with increased risk for diabetes             (prediabetes) > or =6.5%  Consistent with diabetes . This assay result is consistent with a decreased risk of diabetes. . Currently, no consensus exists regarding use of hemoglobin A1c for diagnosis of diabetes in children. . According to American Diabetes Association (ADA) guidelines, hemoglobin A1c <7.0% represents optimal control in non-pregnant diabetic patients. Different metrics may apply to specific patient populations.  Standards of Medical Care in Diabetes(ADA). .     CBG: Recent Labs  Lab 06/29/20 0830 06/29/20 1132 06/29/20 1716 06/29/20 2146 06/30/20 0743  GLUCAP 297* 316* 215* 274* 241*    Recent Results (from the past 240 hour(s))  Resp Panel by RT-PCR (Flu A&B, Covid) Nasopharyngeal Swab     Status: None   Collection Time: 06/26/20 11:18 PM   Specimen: Nasopharyngeal Swab; Nasopharyngeal(NP) swabs in vial transport medium  Result Value Ref Range Status   SARS Coronavirus 2 by RT PCR NEGATIVE NEGATIVE Final    Comment: (NOTE) SARS-CoV-2 target nucleic acids are NOT DETECTED.  The SARS-CoV-2 RNA is generally detectable in upper respiratory specimens during  the acute phase of infection. The lowest concentration of SARS-CoV-2 viral copies this assay can detect is 138 copies/mL. A negative result does not preclude SARS-Cov-2 infection and should not be used as the sole basis for treatment or other patient management decisions. A negative result may occur with  improper specimen collection/handling, submission of specimen other than nasopharyngeal swab, presence of viral mutation(s) within the areas targeted by this assay, and inadequate number of viral copies(<138 copies/mL). A negative result must be combined with clinical observations, patient history, and epidemiological information. The expected result is Negative.  Fact Sheet for Patients:  EntrepreneurPulse.com.au  Fact Sheet for Healthcare Providers:  IncredibleEmployment.be  This test is  no t yet approved or cleared by the [place] and  has been authorized for detection and/or diagnosis of SARS-CoV-2 by FDA under an Emergency Use Authorization (EUA). This EUA will remain  in effect (meaning this test can be used) for the duration of the COVID-19 declaration under Section 564(b)(1) of the Act, 21 U.S.C.section 360bbb-3(b)(1), unless the authorization is terminated  or revoked sooner.       Influenza A by PCR NEGATIVE NEGATIVE Final   Influenza B by PCR NEGATIVE NEGATIVE Final    Comment: (NOTE) The Xpert Xpress SARS-CoV-2/FLU/RSV plus assay is intended as an aid in the diagnosis of influenza from Nasopharyngeal swab specimens and should not be used as a sole basis for treatment. Nasal washings and aspirates are unacceptable for Xpert Xpress SARS-CoV-2/FLU/RSV testing.  Fact Sheet for Patients: EntrepreneurPulse.com.au  Fact Sheet for Healthcare Providers: IncredibleEmployment.be  This test is not yet approved or cleared by the [place] FDA and has been authorized for detection and/or  diagnosis of SARS-CoV-2 by FDA under an Emergency Use Authorization (EUA). This EUA will remain in effect (meaning this test can be used) for the duration of the COVID-19 declaration under Section 564(b)(1) of the Act, 21 U.S.C. section 360bbb-3(b)(1), unless the authorization is terminated or revoked.  Performed at [facility], [address], [place], [place] [address]   MRSA PCR Screening     Status: None   Collection Time: 06/27/20  3:58 PM   Specimen: Nasopharyngeal  Result Value Ref Range Status   MRSA by PCR NEGATIVE NEGATIVE Final    Comment:        The GeneXpert MRSA Assay (FDA approved for NASAL specimens only), is one component of a comprehensive MRSA colonization surveillance program. It is not intended to diagnose MRSA infection nor to guide or monitor treatment for MRSA infections. Performed at [facility], [place] [address], [place], [place] [address]      Scheduled Meds: . [MAR Hold] atorvastatin  10 mg Oral q1800  . [MAR Hold] azithromycin  500 mg Oral Daily  . [MAR Hold] dexamethasone  4 mg Oral Q6H  . [MAR Hold] insulin aspart  0-15 Units Subcutaneous TID WC  . [MAR Hold] insulin aspart  0-5 Units Subcutaneous QHS  . [MAR Hold] insulin glargine  12 Units Subcutaneous Daily  . [MAR Hold] levETIRAcetam  500 mg Oral BID  . [MAR Hold] pantoprazole  40 mg Oral Q0600  . [MAR Hold] traZODone  50 mg Oral QHS   Continuous Infusions: . [MAR Hold] cefTRIAXone (ROCEPHIN)  IV 2 g (06/29/20 2157)     LOS: 4 days   Cherene Altes, MD [facility] Office  [phone] Pager - Text Page per Amion  If 7PM-7AM, please contact night-coverage per Amion 06/30/2020, 9:25 AM

## 2020-06-30 NOTE — Transfer of Care (Signed)
Immediate Anesthesia Transfer of Care Note  Patient: Thomas Garcia  Procedure(s) Performed: MRI WITH ANESTHESIA   BRAIN WITH AND WITHOUT CONTRAST (N/A )  Patient Location: PACU  Anesthesia Type:General  Level of Consciousness: awake and patient cooperative  Airway & Oxygen Therapy: Patient Spontanous Breathing and Patient connected to nasal cannula oxygen  Post-op Assessment: Report given to RN and Post -op Vital signs reviewed and stable  Post vital signs: Reviewed  Last Vitals:  Vitals Value Taken Time  BP 156/77 06/30/20 1405  Temp    Pulse 74 06/30/20 1406  Resp 22 06/30/20 1406  SpO2 100 % 06/30/20 1406  Vitals shown include unvalidated device data.  Last Pain:  Vitals:   06/30/20 0745  TempSrc: Oral  PainSc:          Complications: No complications documented.

## 2020-07-01 ENCOUNTER — Encounter (HOSPITAL_COMMUNITY): Payer: Self-pay | Admitting: Radiology

## 2020-07-01 ENCOUNTER — Other Ambulatory Visit: Payer: Self-pay | Admitting: Neurological Surgery

## 2020-07-01 DIAGNOSIS — R918 Other nonspecific abnormal finding of lung field: Secondary | ICD-10-CM | POA: Diagnosis not present

## 2020-07-01 DIAGNOSIS — G9389 Other specified disorders of brain: Secondary | ICD-10-CM | POA: Diagnosis not present

## 2020-07-01 LAB — GLUCOSE, CAPILLARY
Glucose-Capillary: 298 mg/dL — ABNORMAL HIGH (ref 70–99)
Glucose-Capillary: 320 mg/dL — ABNORMAL HIGH (ref 70–99)
Glucose-Capillary: 241 mg/dL — ABNORMAL HIGH (ref 70–99)
Glucose-Capillary: 227 mg/dL — ABNORMAL HIGH (ref 70–99)

## 2020-07-01 MED ORDER — HEPARIN SODIUM (PORCINE) 5000 UNIT/ML IJ SOLN
5000.0000 [IU] | Freq: Three times a day (TID) | INTRAMUSCULAR | Status: AC
  Administered 2020-07-01 – 2020-07-07 (×18): 5000 [IU] via SUBCUTANEOUS
  Filled 2020-07-01 (×19): qty 1

## 2020-07-01 MED ORDER — INSULIN GLARGINE 100 UNIT/ML ~~LOC~~ SOLN
12.0000 [IU] | Freq: Two times a day (BID) | SUBCUTANEOUS | Status: DC
  Administered 2020-07-01 – 2020-07-03 (×5): 12 [IU] via SUBCUTANEOUS
  Filled 2020-07-01 (×7): qty 0.12

## 2020-07-01 NOTE — Plan of Care (Signed)

## 2020-07-01 NOTE — Evaluation (Signed)
Physical Therapy Evaluation Patient Details Name: Thomas Garcia MRN: [id number] DOB: [birth year] Today's Date: 07/01/2020   History of Present Illness  80 yo male presenting 3/20 with c/o general weakness, fatigue, nausea, and loss of appetite. Upon work-up, pt found to have right occipital cortex with hemorrhage with 36mm midline shift. MRI on 3/23 showed "large multilobulated hemorrhagic lesion in right occipital lobe". PMH includes: lung cancer, HTN, DM II, PAD, L BKA.    Clinical Impression  Pt in bed upon arrival of PT, agreeable to evaluation at this time. Prior to admission the pt was independent at [facility] level at home, with use of grab bars and other equipment to allow for safe transfers and ADLs. The pt now presents with limitations in functional mobility, stability, strength, and endurance due to above dx and chronic mobility issues, and will continue to benefit from skilled PT to address these deficits. The pt was able to demo good independence with initial bed mobility, but did require increased time, effort, and use of bed rails to complete. The pt was then able to manage lateral scoot transfers with minG for safety and positioning/set-up of recliner for pt safety. The pt reports significant fears related to falling and past transfers performed at hospital, was able to complete transfers at this time with max encouragement, and reports he feels more comfortable to perform in future. The pt is likely close to his baseline (WC level), but will benefit from skilled PT to maintain strength and capacity for transfers without assist to facilitate return to independence at home.      Follow Up Recommendations No PT follow up    Equipment Recommendations  None recommended by PT (pt well equipped)    Recommendations for Other Services       Precautions / Restrictions Precautions Precautions: Fall Precaution Comments: L BKA at baseline Restrictions Weight Bearing Restrictions: No       Mobility  Bed Mobility Overal bed mobility: Independent                  Transfers Overall transfer level: Needs assistance   Transfers: Lateral/Scoot Transfers          Lateral/Scoot Transfers: Min guard General transfer comment: minG for safety with lateral scoot to drop-arm recliner. pt able to check for sturdy surface, very concerned about safety  Ambulation/Gait             General Gait Details: deferred, pt reports WC level at baseline      Balance Overall balance assessment: Mild deficits observed, not formally tested                                           Pertinent Vitals/Pain Pain Assessment: No/denies pain    Home Living Family/patient expects to be discharged to:: Private residence Living Arrangements: Other relatives (Brother with COPD) Available Help at Discharge: Family;Available PRN/intermittently Type of Home: Mobile home Home Access: Ramped entrance     Home Layout: One level Home Equipment: Walker - 2 wheels;Grab bars - toilet;Toilet riser;Bedside commode;Wheelchair - manual      Prior Function Level of Independence: Independent with assistive device(s)         Comments: Stand pivot to w/c. sponge bathes at sink. Brother and him perform IADLs but unsure how much he actually performs cooking/cleaning     Hand Dominance   Dominant Hand: Right  Extremity/Trunk Assessment   Upper Extremity Assessment Upper Extremity Assessment: Overall WFL for tasks assessed    Lower Extremity Assessment Lower Extremity Assessment: LLE deficits/detail LLE Deficits / Details: L BKA at baseline    Cervical / Trunk Assessment Cervical / Trunk Assessment: Kyphotic  Communication   Communication: No difficulties  Cognition Arousal/Alertness: Awake/alert Behavior During Therapy: WFL for tasks assessed/performed;Anxious Overall Cognitive Status: No family/caregiver present to determine baseline cognitive  functioning Area of Impairment: Attention;Safety/judgement;Awareness;Problem solving;Following commands                   Current Attention Level: Sustained   Following Commands: Follows one step commands with increased time Safety/Judgement: Decreased awareness of safety;Decreased awareness of deficits Awareness: Emergent;Intellectual Problem Solving: Slow processing;Requires verbal cues General Comments: Pt verbalizing anxiety with mobility. Pt quickly switching from slight agitated behavior and then pleasantly communicating. Benefits from calm cues and environement.      General Comments General comments (skin integrity, edema, etc.): Ex-wife present at begining of session    Exercises     Assessment/Plan    PT Assessment Patient needs continued PT services  PT Problem List Decreased activity tolerance;Decreased balance;Decreased mobility       PT Treatment Interventions DME instruction;Gait training;Stair training;Functional mobility training;Therapeutic activities;Therapeutic exercise;Balance training;Patient/family education    PT Goals (Current goals can be found in the Care Plan section)  Acute Rehab PT Goals Patient Stated Goal: return home PT Goal Formulation: With patient Time For Goal Achievement: 07/15/20 Potential to Achieve Goals: Good    Frequency Min 3X/week        Co-evaluation PT/OT/SLP Co-Evaluation/Treatment: Yes Reason for Co-Treatment: For patient/therapist safety;To address functional/ADL transfers PT goals addressed during session: Mobility/safety with mobility;Balance OT goals addressed during session: ADL's and self-care       AM-PAC PT "6 Clicks" Mobility  Outcome Measure Help needed turning from your back to your side while in a flat bed without using bedrails?: None Help needed moving from lying on your back to sitting on the side of a flat bed without using bedrails?: None Help needed moving to and from a bed to a chair  (including a wheelchair)?: A Little Help needed standing up from a chair using your arms (e.g., wheelchair or bedside chair)?: A Little Help needed to walk in hospital room?: Total Help needed climbing 3-5 steps with a railing? : Total 6 Click Score: 16    End of Session   Activity Tolerance: Patient tolerated treatment well Patient left: in chair;with call bell/phone within reach;with chair alarm set Nurse Communication: Mobility status PT Visit Diagnosis: Other abnormalities of gait and mobility (R26.89)    Time: 4401-0272 PT Time Calculation (min) (ACUTE ONLY): 41 min   Charges:   PT Evaluation $PT Eval Low Complexity: 1 Low          Karma Ganja, PT, DPT   Acute Rehabilitation Department Pager #: [phone]  Otho Bellows 07/01/2020, 5:58 PM

## 2020-07-01 NOTE — Evaluation (Signed)
Occupational Therapy Evaluation Patient Details Name: Thomas Garcia MRN: [id number] DOB: 1941-03-26 Today's Date: 07/01/2020    History of Present Illness 80 yo male presenting 3/20 with c/o general weakness, fatigue, nausea, and loss of appetite. Upon work-up, pt found to have right occipital cortex with hemorrhage with 81mm midline shift. MRI on 3/23 showed "large multilobulated hemorrhagic lesion in right occipital lobe". PMH includes: lung cancer, HTN, DM II, PAD, L BKA.   Clinical Impression   PTA, pt was living with his brother and was performing BADLs and transfers to/from w/c. Pt currently requiring Min A for ADLs and functional transfers. Pt presenting with visual deficits, poor cognition, and decreased activity tolerance. Pt reporting feeling nervous about OOB activity and requiring increased encouragement and time. Pt would benefit from further acute OT to facilitate safe dc. Recommend dc to home with HHOT for further OT to optimize safety, independence with ADLs, and return to PLOF.     Follow Up Recommendations  Home health OT;Supervision/Assistance - 24 hour    Equipment Recommendations  None recommended by OT    Recommendations for Other Services PT consult     Precautions / Restrictions Precautions Precautions: Fall Precaution Comments: L BKA at baseline Restrictions Weight Bearing Restrictions: No      Mobility Bed Mobility Overal bed mobility: Independent                  Transfers Overall transfer level: Needs assistance   Transfers: Lateral/Scoot Transfers          Lateral/Scoot Transfers: Min guard General transfer comment: minG for safety with lateral scoot to drop-arm recliner. pt able to check for sturdy surface, very concerned about safety    Balance Overall balance assessment: Mild deficits observed, not formally tested                                         ADL either performed or assessed with clinical judgement    ADL Overall ADL's : Needs assistance/impaired Eating/Feeding: Set up;Sitting   Grooming: Supervision/safety;Set up;Sitting   Upper Body Bathing: Min guard;Sitting   Lower Body Bathing: Min guard;Sit to/from stand   Upper Body Dressing : Min guard;Sitting   Lower Body Dressing: Minimal assistance;Sit to/from stand   Toilet Transfer: Stand-pivot;BSC;Min guard;+2 for physical assistance;+2 for safety/equipment Toilet Transfer Details (indicate cue type and reason): Min Guard A +2 for safety with pivot         Functional mobility during ADLs: Min guard;+2 for physical assistance;+2 for safety/equipment (stand pivot) General ADL Comments: Pt presenting near baseline functional. nervous about OOB movement. Deficits in cognition and vision impacting safety ADLs performance     Vision Baseline Vision/History: Wears glasses Wears Glasses: At all times Patient Visual Report: No change from baseline Vision Assessment?: Vision impaired- to be further tested in functional context Additional Comments: "Cant see out of L eye". No peripheral vision from the L; unable to see from L until midline. Becoming a little annoyed with testing. Generally tracking therapist     Perception     Praxis      Pertinent Vitals/Pain Pain Assessment: No/denies pain     Hand Dominance Right   Extremity/Trunk Assessment Upper Extremity Assessment Upper Extremity Assessment: Overall WFL for tasks assessed   Lower Extremity Assessment Lower Extremity Assessment: LLE deficits/detail LLE Deficits / Details: L BKA at baseline   Cervical / Trunk Assessment Cervical /  Trunk Assessment: Kyphotic   Communication Communication Communication: No difficulties   Cognition Arousal/Alertness: Awake/alert Behavior During Therapy: WFL for tasks assessed/performed;Anxious Overall Cognitive Status: No family/caregiver present to determine baseline cognitive functioning Area of Impairment:  Attention;Safety/judgement;Awareness;Problem solving;Following commands                   Current Attention Level: Sustained   Following Commands: Follows one step commands with increased time Safety/Judgement: Decreased awareness of safety;Decreased awareness of deficits Awareness: Emergent;Intellectual Problem Solving: Slow processing;Requires verbal cues General Comments: Pt verbalizing anxiety with mobility. Pt quickly switching from slight agitated behavior and then pleasantly communicating. Benefits from calm cues and environement.   General Comments  Ex-wife present at begining of session    Exercises     Shoulder Instructions      Home Living Family/patient expects to be discharged to:: Private residence Living Arrangements: Other relatives (Brother with COPD) Available Help at Discharge: Family;Available PRN/intermittently Type of Home: Mobile home Home Access: Ramped entrance     Home Layout: One level     Bathroom Shower/Tub: Teacher, early years/pre: Standard     Home Equipment: Walker - 2 wheels;Grab bars - toilet;Toilet riser;Bedside commode;Wheelchair - manual          Prior Functioning/Environment Level of Independence: Independent with assistive device(s)        Comments: Stand pivot to w/c. sponge bathes at sink. Brother and him perform IADLs but unsure how much he actually performs cooking/cleaning        OT Problem List: Decreased strength;Decreased range of motion;Decreased activity tolerance;Impaired balance (sitting and/or standing);Decreased safety awareness;Decreased knowledge of precautions;Decreased knowledge of use of DME or AE;Decreased cognition;Impaired vision/perception      OT Treatment/Interventions: Self-care/ADL training;Therapeutic exercise;Energy conservation;DME and/or AE instruction;Therapeutic activities;Patient/family education    OT Goals(Current goals can be found in the care plan section) Acute Rehab OT  Goals Patient Stated Goal: return home OT Goal Formulation: With patient Time For Goal Achievement: 07/15/20 Potential to Achieve Goals: Good  OT Frequency: Min 2X/week   Barriers to D/C:            Co-evaluation PT/OT/SLP Co-Evaluation/Treatment: Yes Reason for Co-Treatment: For patient/therapist safety;To address functional/ADL transfers PT goals addressed during session: Mobility/safety with mobility;Balance OT goals addressed during session: ADL's and self-care      AM-PAC OT "6 Clicks" Daily Activity     Outcome Measure Help from another person eating meals?: None Help from another person taking care of personal grooming?: A Little Help from another person toileting, which includes using toliet, bedpan, or urinal?: A Little Help from another person bathing (including washing, rinsing, drying)?: A Little Help from another person to put on and taking off regular upper body clothing?: A Little Help from another person to put on and taking off regular lower body clothing?: A Little 6 Click Score: 19   End of Session Nurse Communication: Mobility status  Activity Tolerance: Patient tolerated treatment well Patient left: in chair;with call bell/phone within reach;with chair alarm set  OT Visit Diagnosis: Other abnormalities of gait and mobility (R26.89);Unsteadiness on feet (R26.81);Muscle weakness (generalized) (M62.81)                Time: 2423-5361 OT Time Calculation (min): 41 min Charges:  OT General Charges $OT Visit: 1 Visit OT Evaluation $OT Eval Moderate Complexity: 1 Mod OT Treatments $Self Care/Home Management : 8-22 mins  Latana Colin MSOT, OTR/L Acute Rehab Pager: [phone] Office: [place] 07/01/2020,  4:53 PM

## 2020-07-01 NOTE — Progress Notes (Signed)
   Providing Compassionate, Quality Care - Together  NEUROSURGERY PROGRESS NOTE   S: No issues overnight.   O: EXAM:  BP 140/84 (BP Location: Left Arm)   Pulse 83   Temp (!) 97.5 F (36.4 C) (Oral)   Resp 18   Ht 5\' 9"  (1.753 m)   Wt 81.6 kg   SpO2 100%   BMI 26.58 kg/m   Awake alert oriented x3 Poor dentition No acute distress Face symmetric PERRLA EOMI Left-sided neglect Left hemianopsia No drift Bilateral upper extremities 4+/5 Bilateral lower extremities 4+5 (except for left BKA) Sensory intact light touch   ASSESSMENT:  80 y.o. male with   1. Right occipital tumor  PLAN: - MRI brain completed, large multilobulated right occipital enhancing lesion with hemorrhage, likely metastatic in nature.  Tentatively put on for craniotomy and resection on Friday 4/1.  We will continue to work on the schedule to see if I can get him added on sooner. -Decadron -keppra -pain control -HOLD PLAVIX -CT CAP - likely lung mets -Discussed the findings with the patient this morning.  He agrees with planning of surgical resection.  I attempted to call the son without answer, will call him again later today. -Okay for DVT prophylaxis with subQ heparin    Thank you for allowing me to participate in this patient's care.  Please do not hesitate to call with questions or concerns.   Elwin Sleight, [facility] Cell: [phone]

## 2020-07-01 NOTE — Progress Notes (Signed)
Thomas Garcia  XIP:[id number] DOB: 1941-03-31 DOA: 06/26/2020 PCP: Doree Albee, MD    Brief Narrative:  80yo with a history of HTN, DM 2, and PAD who presented to the ER with severe generalized weakness and fatigue, nausea, loss of appetite, and scant hemoptysis. 1 month prior he presented to an ER with shortness of breath and hemoptysis and was found to have small bilateral pulmonary emboli as well as a masslike consolidation in the right upper lobe with cavitation.  He refused admission at that time.  Upon his return to the ER for this admission a CT head revealed evidence of a right occipital mass.  Antimicrobials:  Rocephin 3/21 >3/25  DVT prophylaxis: SCDs  Consultants:  Pulmonary Neurosurgery  Subjective: Afebrile.  Vital signs stable.  Saturation 100% on room air.  MRI brain with contrast accomplished under anesthesia yesterday. Alert and conversant at time of my exam, but mildly confused. Denied cp, n/v, or abdom pain. No hemoptysis today.   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 Being treated with empiric antibiotic for possible postobstructive pneumonia -pulmonary following -Plavix on hold for planned biopsy -if develops gross hemoptysis will need IR directed embolization  Right occipital mass -suspected metastatic lung cancer Complicated by mild hemorrhage and vasogenic edema resulting in a 2 mm right-to-left shift -neurosurgery has evaluated -continue Decadron and empiric Keppra - craniotomy timing as per Neurosurgery  Pulmonary embolism Anticoagulation on hold in setting of hemoptysis and bleeding from brain mass -no evidence of DVT on bilateral lower extremity venous duplex - begin SQ heparin at prophy dose today and monitor closely for bleeding   Chronic iron deficiency anemia Likely a consequence of his previously undiagnosed cancer  PAD Status post left BKA -Plavix necessarily on hold due to above -continue  statin  HTN Blood pressure reasonably controlled at present  Uncontrolled DM 2 w/ hyperglycemia A1c 6.4 - CBG remains poorly controlled due to use of Decadron -adjust treatment today and follow  GERD Well compensated  Hyponatremia Corrected  Code Status: FULL CODE Family Communication:  Status is: Inpatient  Remains inpatient appropriate because:Inpatient level of care appropriate due to severity of illness   Dispo: The patient is from: Home              Anticipated d/c is to: unclear              Patient currently is not medically stable to d/c.   Difficult to place patient No     Objective: Blood pressure (!) 159/57, pulse 69, temperature (!) 97.5 F (36.4 C), resp. rate 18, height 5\' 9"  (1.753 m), weight 81.6 kg, SpO2 100 %.  Intake/Output Summary (Last 24 hours) at 07/01/2020 1503 Last data filed at 07/01/2020 0730 Gross per 24 hour  Intake 440 ml  Output 1150 ml  Net -710 ml   Filed Weights   06/26/20 1856  Weight: 81.6 kg    Examination: General: No acute respiratory distress Lungs: Good air movement bilaterally - no wheeze Cardiovascular: RRR without murmur Abdomen: NT/ND, soft, BS positive, no rebound Extremities: No cyanosis, clubbing, or edema B LE  CBC: Recent Labs  Lab 06/26/20 2045 06/27/20 0529 06/28/20 0153 06/28/20 1024 06/28/20 1948 06/29/20 0306  WBC 12.8* 12.4* 10.8*  --   --  16.4*  NEUTROABS 10.3*  --   --   --   --   --   HGB 7.8* 7.0* 7.1* 7.5* 8.3* 8.6*  HCT 27.7* 24.7* 24.8*  25.3* 27.8* 28.3*  MCV 76.3* 75.1* 74.0*  --   --  75.3*  PLT 745* 666* 683*  --   --  782*   Basic Metabolic Panel: Recent Labs  Lab 06/26/20 2047 06/27/20 0529 06/28/20 0153 06/29/20 0306  NA 132* 133* 131* 136  K 4.0 4.4 3.8 4.0  CL 98 102 100 104  CO2 22 22 22 27   GLUCOSE 157* 166* 288* 234*  BUN 23 26* 24* 18  CREATININE 1.01 0.90 0.85 0.70  CALCIUM 8.7* 8.4* 8.5* 8.6*  MG 2.1  --   --  2.3   GFR: Estimated Creatinine Clearance: 77.3  mL/min (by C-G formula based on SCr of 0.7 mg/dL).  Liver Function Tests: Recent Labs  Lab 06/26/20 2047  AST 12*  ALT 11  ALKPHOS 74  BILITOT 0.4  PROT 7.5  ALBUMIN 3.3*    Coagulation Profile: Recent Labs  Lab 06/26/20 2047  INR 1.1    HbA1C: Hgb A1c MFr Bld  Date/Time Value Ref Range Status  06/27/2020 05:29 AM 6.4 (H) 4.8 - 5.6 % Final    Comment:    (NOTE) Pre diabetes:          5.7%-6.4%  Diabetes:              >6.4%  Glycemic control for   <7.0% adults with diabetes   04/14/2020 12:00 AM 5.6 <5.7 % of total Hgb Final    Comment:    For the purpose of screening for the presence of diabetes: . <5.7%       Consistent with the absence of diabetes 5.7-6.4%    Consistent with increased risk for diabetes             (prediabetes) > or =6.5%  Consistent with diabetes . This assay result is consistent with a decreased risk of diabetes. . Currently, no consensus exists regarding use of hemoglobin A1c for diagnosis of diabetes in children. . According to American Diabetes Association (ADA) guidelines, hemoglobin A1c <7.0% represents optimal control in non-pregnant diabetic patients. Different metrics may apply to specific patient populations.  Standards of Medical Care in Diabetes(ADA). .     CBG: Recent Labs  Lab 06/30/20 1406 06/30/20 1804 06/30/20 2129 07/01/20 0759 07/01/20 1131  GLUCAP 214* 338* 256* 298* 320*    Recent Results (from the past 240 hour(s))  Resp Panel by RT-PCR (Flu A&B, Covid) Nasopharyngeal Swab     Status: None   Collection Time: 06/26/20 11:18 PM   Specimen: Nasopharyngeal Swab; Nasopharyngeal(NP) swabs in vial transport medium  Result Value Ref Range Status   SARS Coronavirus 2 by RT PCR NEGATIVE NEGATIVE Final    Comment: (NOTE) SARS-CoV-2 target nucleic acids are NOT DETECTED.  The SARS-CoV-2 RNA is generally detectable in upper respiratory specimens during the acute phase of infection. The lowest concentration  of SARS-CoV-2 viral copies this assay can detect is 138 copies/mL. A negative result does not preclude SARS-Cov-2 infection and should not be used as the sole basis for treatment or other patient management decisions. A negative result may occur with  improper specimen collection/handling, submission of specimen other than nasopharyngeal swab, presence of viral mutation(s) within the areas targeted by this assay, and inadequate number of viral copies(<138 copies/mL). A negative result must be combined with clinical observations, patient history, and epidemiological information. The expected result is Negative.  Fact Sheet for Patients:  EntrepreneurPulse.com.au  Fact Sheet for Healthcare Providers:  IncredibleEmployment.be  This test is no t yet approved  or cleared by the [place] and  has been authorized for detection and/or diagnosis of SARS-CoV-2 by FDA under an Emergency Use Authorization (EUA). This EUA will remain  in effect (meaning this test can be used) for the duration of the COVID-19 declaration under Section 564(b)(1) of the Act, 21 U.S.C.section 360bbb-3(b)(1), unless the authorization is terminated  or revoked sooner.       Influenza A by PCR NEGATIVE NEGATIVE Final   Influenza B by PCR NEGATIVE NEGATIVE Final    Comment: (NOTE) The Xpert Xpress SARS-CoV-2/FLU/RSV plus assay is intended as an aid in the diagnosis of influenza from Nasopharyngeal swab specimens and should not be used as a sole basis for treatment. Nasal washings and aspirates are unacceptable for Xpert Xpress SARS-CoV-2/FLU/RSV testing.  Fact Sheet for Patients: EntrepreneurPulse.com.au  Fact Sheet for Healthcare Providers: IncredibleEmployment.be  This test is not yet approved or cleared by the [place] FDA and has been authorized for detection and/or diagnosis of SARS-CoV-2 by FDA under an Emergency Use  Authorization (EUA). This EUA will remain in effect (meaning this test can be used) for the duration of the COVID-19 declaration under Section 564(b)(1) of the Act, 21 U.S.C. section 360bbb-3(b)(1), unless the authorization is terminated or revoked.  Performed at [facility], [address] [place], [place] [address]   MRSA PCR Screening     Status: None   Collection Time: 06/27/20  3:58 PM   Specimen: Nasopharyngeal  Result Value Ref Range Status   MRSA by PCR NEGATIVE NEGATIVE Final    Comment:        The GeneXpert MRSA Assay (FDA approved for NASAL specimens only), is one component of a comprehensive MRSA colonization surveillance program. It is not intended to diagnose MRSA infection nor to guide or monitor treatment for MRSA infections. Performed at [facility], [place] [address], [place], [place] [address]      Scheduled Meds: . atorvastatin  10 mg Oral q1800  . dexamethasone  4 mg Oral Q6H  . insulin aspart  0-20 Units Subcutaneous TID WC  . insulin aspart  0-5 Units Subcutaneous QHS  . insulin glargine  12 Units Subcutaneous BID  . levETIRAcetam  500 mg Oral BID  . pantoprazole  40 mg Oral Q0600  . traZODone  50 mg Oral QHS      LOS: 5 days   Cherene Altes, MD [facility] Office  [phone] Pager - Text Page per Amion  If 7PM-7AM, please contact night-coverage per Amion 07/01/2020, 3:03 PM

## 2020-07-01 NOTE — Plan of Care (Signed)
Updated son Ed over the phone. All questions were answered.  Julian Hy, DO 07/01/20 11:34 AM [facility]

## 2020-07-01 NOTE — Progress Notes (Signed)
NAME:  DUBLIN GRAYER, MRN:  [id number], DOB:  [birth year], LOS: 5 ADMISSION DATE:  06/26/2020, CONSULTATION DATE:  06/27/2020 REFERRING MD:  Dr. Myna Hidalgo, CHIEF COMPLAINT:  Lung mass    History of Present Illness:  Thomas Garcia is a 80 y.o. 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 Of note patient was seen at this health system 1 month ago for cough and hemoptysis and was found to have small bilateral age-indeterminate PE's and a right upper lobe mass but unfortunately patient refused admission.   Since that ED visit patient has experienced worsened  generalized weakness, fatigue, nausea, hemoptysis, and loss of appetite. He reports a ~80lb weight loss over the span of a year. Additional he reports chills, vision changes, progressive weakness, and cough with hemoptysis. He reports a 80ppd smoking history.   Given lung mass with high suspicion for metastatic cancer PCCM was consulted for assistance in tissue sampling.    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 Admitted 3/21 . Brain MRI 3/24> 1 brain met  Interim History / Subjective:  I reviewed the MRI showing 1 brain met in the known location. Neurosurgery planning surgery next week, which will provide a diagnosis. He is still having scant hemoptysis.  Objective   Blood pressure 140/84, pulse 83, temperature (!) 97.5 F (36.4 C), temperature source Oral, resp. rate 18, height _0  (1.753 m), weight 81.6 kg, SpO2 100 %.        Intake/Output Summary (Last 24 hours) at 07/01/2020 0939 Last data filed at 07/01/2020 0730 Gross per 24 hour  Intake 1015 ml  Output 1150 ml  Net -135 ml   Filed Weights   06/26/20 1856  Weight: 81.6 kg    Examination: General: chronically ill appearing man sitting up in bed  in NAD HEENT: [place]/AT, eyes anicteric, poor dentition Neuro: awake, alert, moving extremities. Vision grossly intact, but visual field testing not performed. CV: S1S2, RRR PULM:  Breathing comfortably on RA. CTAB posteriorly, reduced anterior upper breath sounds on right with egophony. No observed coughing.   GI: soft, NT, ND Extremities:  BKA L, no edema. Skin: no wounds  Labs/imaging that I have personally reviewed    Brain MRI 3/24> occipital brain met, no additional metastatic disease  Resolved Hospital Problem list     Assessment & Plan:  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; no cavitation so no concern for TB. Ongoing mild hemoptysis from lung mass. Brain mass with associated intrinsic hemorrhage and vasogenic edema  P: -Con't to hold Plavix. Per NS, planning for craniotomy next week, which will provided a tissue diagnosis. No need for lung biopsy unless there remains uncertainty of the pathology after brain biopsy.  -If his hemoptysis becomes massive, he would need IR for embolization of mass.  -Attempted to call son Cyler twice; left a message indicating that his dad will not require a lung biopsy at this time.   PE; no DVT on duplex -recommend holding AC for now given anemia, hemoptysis -IVC filter may be appropriate in this situation; post-operatively he will be very high risk for DVT  Chronic anemia -transfuse for Hb<7 or hemodynamically significant bleeding  PCCM will sign off. Please call with questions.   Best practice (evaluated daily)  Per  primary  Labs   CBC: Recent Labs  Lab 06/26/20 2045 06/27/20 0529 06/28/20 0153 06/28/20 1024 06/28/20 1948 06/29/20 0306  WBC 12.8* 12.4* 10.8*  --   --  16.4*  NEUTROABS 10.3*  --   --   --   --   --   HGB 7.8* 7.0* 7.1* 7.5* 8.3* 8.6*  HCT 27.7* 24.7* 24.8* 25.3* 27.8* 28.3*  MCV 76.3* 75.1* 74.0*  --   --  75.3*  PLT 745* 666* 683*  --   --  655*    Basic Metabolic Panel: Recent Labs   Lab 06/26/20 2047 06/27/20 0529 06/28/20 0153 06/29/20 0306  NA 132* 133* 131* 136  K 4.0 4.4 3.8 4.0  CL 98 102 100 104  CO2 _0 GLUCOSE 157* 166* 288* 234*  BUN 23 26* 24* 18  CREATININE 1.01 0.90 0.85 0.70  CALCIUM 8.7* 8.4* 8.5* 8.6*  MG 2.1  --   --  2.3    Julian Hy, DO 07/01/20 9:53 AM [facility]  For contact information, see Amion. If no response to pager, please call PCCM consult pager. After hours, 7PM- 7AM, please call Elink.

## 2020-07-02 DIAGNOSIS — G9389 Other specified disorders of brain: Secondary | ICD-10-CM | POA: Diagnosis not present

## 2020-07-02 LAB — BPAM RBC
Blood Product Expiration Date: 202204142359
Blood Product Expiration Date: 202204142359
ISSUE DATE / TIME: 202203221233
Unit Type and Rh: 6200
Unit Type and Rh: 6200

## 2020-07-02 LAB — TYPE AND SCREEN
ABO/RH(D): A POS
Antibody Screen: NEGATIVE
Unit division: 0
Unit division: 0

## 2020-07-02 LAB — CBC
HCT: 33.1 % — ABNORMAL LOW (ref 39.0–52.0)
Hemoglobin: 9.7 g/dL — ABNORMAL LOW (ref 13.0–17.0)
MCH: 22.7 pg — ABNORMAL LOW (ref 26.0–34.0)
MCHC: 29.3 g/dL — ABNORMAL LOW (ref 30.0–36.0)
MCV: 77.5 fL — ABNORMAL LOW (ref 80.0–100.0)
Platelets: 574 10*3/uL — ABNORMAL HIGH (ref 150–400)
RBC: 4.27 MIL/uL (ref 4.22–5.81)
RDW: 21 % — ABNORMAL HIGH (ref 11.5–15.5)
WBC: 18.5 10*3/uL — ABNORMAL HIGH (ref 4.0–10.5)
nRBC: 0.1 % (ref 0.0–0.2)

## 2020-07-02 LAB — GLUCOSE, CAPILLARY
Glucose-Capillary: 148 mg/dL — ABNORMAL HIGH (ref 70–99)
Glucose-Capillary: 222 mg/dL — ABNORMAL HIGH (ref 70–99)
Glucose-Capillary: 146 mg/dL — ABNORMAL HIGH (ref 70–99)
Glucose-Capillary: 187 mg/dL — ABNORMAL HIGH (ref 70–99)
Glucose-Capillary: 249 mg/dL — ABNORMAL HIGH (ref 70–99)

## 2020-07-02 NOTE — Progress Notes (Signed)
Thomas Garcia  YBO:[id number] DOB: [birth year] DOA: 06/26/2020 PCP: Doree Albee, MD    Brief Narrative:  80yo with a history of HTN, DM 2, and PAD who presented to the ER with severe generalized weakness and fatigue, nausea, loss of appetite, and scant hemoptysis. 1 month prior he presented to an ER with shortness of breath and hemoptysis and was found to have small bilateral pulmonary emboli as well as a masslike consolidation in the right upper lobe with cavitation.  He refused admission at that time.  Upon his return to the ER for this admission a CT head revealed evidence of a right occipital mass.  Antimicrobials:  Rocephin 3/21 >3/25  DVT prophylaxis: Subcutaneous heparin  Consultants:  Pulmonary Neurosurgery  Subjective: Vital signs stable.  Afebrile.  Reports only scant infrequent hemoptysis.  No nausea vomiting abdominal pain.  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 Was treated with empiric antibiotic for possible postobstructive pneumonia -pulmonary following - if develops gross hemoptysis will need IR directed embolization  Right occipital mass -suspected metastatic lung cancer Complicated by mild hemorrhage and vasogenic edema resulting in a 2 mm right-to-left shift -neurosurgery following - continue Decadron and empiric Keppra - craniotomy timing as per Neurosurgery with Plavix on hold  Pulmonary embolism Full anticoagulation on hold in setting of hemoptysis and bleeding from brain mass -no evidence of DVT on bilateral lower extremity venous duplex - began SQ heparin at prophy dose 3/25 - monitor closely for bleeding   Chronic iron deficiency anemia Likely a consequence of his previously undiagnosed cancer  PAD Status post left BKA -Plavix necessarily on hold due to above -continue statin  HTN Blood pressure reasonably controlled at present  Uncontrolled DM 2 w/ hyperglycemia A1c 6.4 - CBG poorly controlled due to use of  Decadron but does appear to be trending favorably at this time -follow without change in treatment today  GERD Well compensated  Hyponatremia Corrected  Code Status: FULL CODE Family Communication:  Status is: Inpatient  Remains inpatient appropriate because:Inpatient level of care appropriate due to severity of illness   Dispo: The patient is from: Home              Anticipated d/c is to: unclear              Patient currently is not medically stable to d/c.   Difficult to place patient No     Objective: Blood pressure (!) 143/62, pulse 70, temperature 97.7 F (36.5 C), temperature source Oral, resp. rate 14, height 5\' 9"  (1.753 m), weight 81.6 kg, SpO2 97 %.  Intake/Output Summary (Last 24 hours) at 07/02/2020 0841 Last data filed at 07/01/2020 1430 Gross per 24 hour  Intake 240 ml  Output 200 ml  Net 40 ml   Filed Weights   06/26/20 1856  Weight: 81.6 kg    Examination: General: No acute respiratory distress Lungs: Good air movement bilaterally Cardiovascular: RRR  Abdomen: NT/ND, soft, BS positive, no rebound Extremities: No cyanosis, clubbing, or edema B LE  CBC: Recent Labs  Lab 06/26/20 2045 06/27/20 0529 06/28/20 0153 06/28/20 1024 06/28/20 1948 06/29/20 0306 07/02/20 0057  WBC 12.8*   < > 10.8*  --   --  16.4* 18.5*  NEUTROABS 10.3*  --   --   --   --   --   --   HGB 7.8*   < > 7.1*   < > 8.3* 8.6* 9.7*  HCT 27.7*   < >  24.8*   < > 27.8* 28.3* 33.1*  MCV 76.3*   < > 74.0*  --   --  75.3* 77.5*  PLT 745*   < > 683*  --   --  655* 574*   < > = values in this interval not displayed.   Basic Metabolic Panel: Recent Labs  Lab 06/26/20 2047 06/27/20 0529 06/28/20 0153 06/29/20 0306  NA 132* 133* 131* 136  K 4.0 4.4 3.8 4.0  CL 98 102 100 104  CO2 22 22 22 27   GLUCOSE 157* 166* 288* 234*  BUN 23 26* 24* 18  CREATININE 1.01 0.90 0.85 0.70  CALCIUM 8.7* 8.4* 8.5* 8.6*  MG 2.1  --   --  2.3   GFR: Estimated Creatinine Clearance: 77.3  mL/min (by C-G formula based on SCr of 0.7 mg/dL).  Liver Function Tests: Recent Labs  Lab 06/26/20 2047  AST 12*  ALT 11  ALKPHOS 74  BILITOT 0.4  PROT 7.5  ALBUMIN 3.3*    Coagulation Profile: Recent Labs  Lab 06/26/20 2047  INR 1.1    HbA1C: Hgb A1c MFr Bld  Date/Time Value Ref Range Status  06/27/2020 05:29 AM 6.4 (H) 4.8 - 5.6 % Final    Comment:    (NOTE) Pre diabetes:          5.7%-6.4%  Diabetes:              >6.4%  Glycemic control for   <7.0% adults with diabetes   04/14/2020 12:00 AM 5.6 <5.7 % of total Hgb Final    Comment:    For the purpose of screening for the presence of diabetes: . <5.7%       Consistent with the absence of diabetes 5.7-6.4%    Consistent with increased risk for diabetes             (prediabetes) > or =6.5%  Consistent with diabetes . This assay result is consistent with a decreased risk of diabetes. . Currently, no consensus exists regarding use of hemoglobin A1c for diagnosis of diabetes in children. . According to American Diabetes Association (ADA) guidelines, hemoglobin A1c <7.0% represents optimal control in non-pregnant diabetic patients. Different metrics may apply to specific patient populations.  Standards of Medical Care in Diabetes(ADA). .     CBG: Recent Labs  Lab 07/01/20 0759 07/01/20 1131 07/01/20 1716 07/01/20 2137 07/02/20 0831  GLUCAP 298* 320* 241* 227* 148*    Recent Results (from the past 240 hour(s))  Resp Panel by RT-PCR (Flu A&B, Covid) Nasopharyngeal Swab     Status: None   Collection Time: 06/26/20 11:18 PM   Specimen: Nasopharyngeal Swab; Nasopharyngeal(NP) swabs in vial transport medium  Result Value Ref Range Status   SARS Coronavirus 2 by RT PCR NEGATIVE NEGATIVE Final    Comment: (NOTE) SARS-CoV-2 target nucleic acids are NOT DETECTED.  The SARS-CoV-2 RNA is generally detectable in upper respiratory specimens during the acute phase of infection. The lowest concentration  of SARS-CoV-2 viral copies this assay can detect is 138 copies/mL. A negative result does not preclude SARS-Cov-2 infection and should not be used as the sole basis for treatment or other patient management decisions. A negative result may occur with  improper specimen collection/handling, submission of specimen other than nasopharyngeal swab, presence of viral mutation(s) within the areas targeted by this assay, and inadequate number of viral copies(<138 copies/mL). A negative result must be combined with clinical observations, patient history, and epidemiological information. The expected result is  Negative.  Fact Sheet for Patients:  EntrepreneurPulse.com.au  Fact Sheet for Healthcare Providers:  IncredibleEmployment.be  This test is no t yet approved or cleared by the [place] FDA and  has been authorized for detection and/or diagnosis of SARS-CoV-2 by FDA under an Emergency Use Authorization (EUA). This EUA will remain  in effect (meaning this test can be used) for the duration of the COVID-19 declaration under Section 564(b)(1) of the Act, 21 U.S.C.section 360bbb-3(b)(1), unless the authorization is terminated  or revoked sooner.       Influenza A by PCR NEGATIVE NEGATIVE Final   Influenza B by PCR NEGATIVE NEGATIVE Final    Comment: (NOTE) The Xpert Xpress SARS-CoV-2/FLU/RSV plus assay is intended as an aid in the diagnosis of influenza from Nasopharyngeal swab specimens and should not be used as a sole basis for treatment. Nasal washings and aspirates are unacceptable for Xpert Xpress SARS-CoV-2/FLU/RSV testing.  Fact Sheet for Patients: EntrepreneurPulse.com.au  Fact Sheet for Healthcare Providers: IncredibleEmployment.be  This test is not yet approved or cleared by the [place] FDA and has been authorized for detection and/or diagnosis of SARS-CoV-2 by FDA under an Emergency Use  Authorization (EUA). This EUA will remain in effect (meaning this test can be used) for the duration of the COVID-19 declaration under Section 564(b)(1) of the Act, 21 U.S.C. section 360bbb-3(b)(1), unless the authorization is terminated or revoked.  Performed at [facility], [address] [place], [place] [address]   MRSA PCR Screening     Status: None   Collection Time: 06/27/20  3:58 PM   Specimen: Nasopharyngeal  Result Value Ref Range Status   MRSA by PCR NEGATIVE NEGATIVE Final    Comment:        The GeneXpert MRSA Assay (FDA approved for NASAL specimens only), is one component of a comprehensive MRSA colonization surveillance program. It is not intended to diagnose MRSA infection nor to guide or monitor treatment for MRSA infections. Performed at [facility], [place] [address], [place], [place] [address]      Scheduled Meds: . atorvastatin  10 mg Oral q1800  . dexamethasone  4 mg Oral Q6H  . heparin injection (subcutaneous)  5,000 Units Subcutaneous Q8H  . insulin aspart  0-20 Units Subcutaneous TID WC  . insulin aspart  0-5 Units Subcutaneous QHS  . insulin glargine  12 Units Subcutaneous BID  . levETIRAcetam  500 mg Oral BID  . pantoprazole  40 mg Oral Q0600  . traZODone  50 mg Oral QHS      LOS: 6 days   Cherene Altes, MD [facility] Office  [phone] Pager - Text Page per Amion  If 7PM-7AM, please contact night-coverage per Amion 07/02/2020, 8:41 AM

## 2020-07-02 NOTE — Plan of Care (Signed)

## 2020-07-03 DIAGNOSIS — G9389 Other specified disorders of brain: Secondary | ICD-10-CM | POA: Diagnosis not present

## 2020-07-03 LAB — GLUCOSE, CAPILLARY
Glucose-Capillary: 163 mg/dL — ABNORMAL HIGH (ref 70–99)
Glucose-Capillary: 184 mg/dL — ABNORMAL HIGH (ref 70–99)
Glucose-Capillary: 229 mg/dL — ABNORMAL HIGH (ref 70–99)
Glucose-Capillary: 235 mg/dL — ABNORMAL HIGH (ref 70–99)
Glucose-Capillary: 287 mg/dL — ABNORMAL HIGH (ref 70–99)
Glucose-Capillary: 316 mg/dL — ABNORMAL HIGH (ref 70–99)

## 2020-07-03 MED ORDER — CALCIUM CARBONATE ANTACID 500 MG PO CHEW
1.0000 | CHEWABLE_TABLET | Freq: Four times a day (QID) | ORAL | Status: DC | PRN
  Administered 2020-07-04: 400 mg via ORAL
  Administered 2020-07-05 – 2020-07-07 (×2): 200 mg via ORAL
  Filled 2020-07-03: qty 2
  Filled 2020-07-03 (×2): qty 1

## 2020-07-03 NOTE — Plan of Care (Signed)

## 2020-07-03 NOTE — Progress Notes (Signed)
HARVEST STANCO  JJO:[id number] DOB: [birth year] DOA: 06/26/2020 PCP: Doree Albee, MD    Brief Narrative:  80yo with a hx of HTN, DM 2, and PAD who presented to the ER with severe generalized weakness and fatigue, nausea, loss of appetite, and scant hemoptysis. 1 month prior he presented to an ER with shortness of breath and 6 months of hemoptysis and was found to have small bilateral pulmonary emboli as well as a masslike consolidation in the right upper lobe with cavitation.  He refused admission at that time.  Upon his return to the ER for this admission a CT head revealed evidence of a right occipital mass.  Antimicrobials:  Rocephin 3/21 >3/25  DVT prophylaxis: Subcutaneous heparin  Consultants:  Pulmonary Neurosurgery  Subjective: Awaiting craniotomy.  Afebrile.  Vitals stable.  CBG much improved.  No complaints today.  In good spirits and pleasant.  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 Was treated with empiric antibiotic for possible postobstructive pneumonia - pulmonary following peripherally- if develops gross hemoptysis will need IR directed embolization  Right occipital mass -suspected metastatic lung cancer Complicated by mild hemorrhage and vasogenic edema resulting in a 2 mm right-to-left shift - neurosurgery following - continue Decadron and empiric Keppra - craniotomy timing as per Neurosurgery with Plavix on hold  Pulmonary embolism Full anticoagulation on hold in setting of hemoptysis and bleeding from brain mass - no evidence of DVT on bilateral lower extremity venous duplex - began SQ heparin at prophy dose 3/25 - monitor closely for bleeding - consideration being given to IVC filter preoperatively  Chronic iron deficiency anemia Likely a consequence of his previously undiagnosed cancer  PAD Status post left BKA - Plavix necessarily on hold due to above - continue statin - followed by Dr. Carlis Abbott  HTN Blood pressure  reasonably controlled   Uncontrolled DM 2 w/ hyperglycemia A1c 6.4 - CBG trending in a favorable range -follow without change today  GERD Well compensated  Hyponatremia Corrected  Code Status: FULL CODE Family Communication:  Status is: Inpatient  Remains inpatient appropriate because:Inpatient level of care appropriate due to severity of illness   Dispo: The patient is from: Home              Anticipated d/c is to: unclear              Patient currently is not medically stable to d/c.   Difficult to place patient No     Objective: Blood pressure (!) 169/60, pulse 71, temperature 97.7 F (36.5 C), temperature source Oral, resp. rate 16, height 5\' 9"  (1.753 m), weight 81.6 kg, SpO2 98 %.  Intake/Output Summary (Last 24 hours) at 07/03/2020 0852 Last data filed at 07/03/2020 0604 Gross per 24 hour  Intake -  Output 950 ml  Net -950 ml   Filed Weights   06/26/20 1856  Weight: 81.6 kg    Examination: General: No acute respiratory distress Lungs: Good air movement bilaterally without wheezing Cardiovascular: RRR without murmur Abdomen: NT/ND, soft, BS positive, no rebound Extremities: No edema lower extremities/stump  CBC: Recent Labs  Lab 06/26/20 2045 06/27/20 0529 06/28/20 0153 06/28/20 1024 06/28/20 1948 06/29/20 0306 07/02/20 0057  WBC 12.8*   < > 10.8*  --   --  16.4* 18.5*  NEUTROABS 10.3*  --   --   --   --   --   --   HGB 7.8*   < > 7.1*   < >  8.3* 8.6* 9.7*  HCT 27.7*   < > 24.8*   < > 27.8* 28.3* 33.1*  MCV 76.3*   < > 74.0*  --   --  75.3* 77.5*  PLT 745*   < > 683*  --   --  655* 574*   < > = values in this interval not displayed.   Basic Metabolic Panel: Recent Labs  Lab 06/26/20 2047 06/27/20 0529 06/28/20 0153 06/29/20 0306  NA 132* 133* 131* 136  K 4.0 4.4 3.8 4.0  CL 98 102 100 104  CO2 22 22 22 27   GLUCOSE 157* 166* 288* 234*  BUN 23 26* 24* 18  CREATININE 1.01 0.90 0.85 0.70  CALCIUM 8.7* 8.4* 8.5* 8.6*  MG 2.1  --   --   2.3   GFR: Estimated Creatinine Clearance: 77.3 mL/min (by C-G formula based on SCr of 0.7 mg/dL).  Liver Function Tests: Recent Labs  Lab 06/26/20 2047  AST 12*  ALT 11  ALKPHOS 74  BILITOT 0.4  PROT 7.5  ALBUMIN 3.3*    Coagulation Profile: Recent Labs  Lab 06/26/20 2047  INR 1.1    HbA1C: Hgb A1c MFr Bld  Date/Time Value Ref Range Status  06/27/2020 05:29 AM 6.4 (H) 4.8 - 5.6 % Final    Comment:    (NOTE) Pre diabetes:          5.7%-6.4%  Diabetes:              >6.4%  Glycemic control for   <7.0% adults with diabetes   04/14/2020 12:00 AM 5.6 <5.7 % of total Hgb Final    Comment:    For the purpose of screening for the presence of diabetes: . <5.7%       Consistent with the absence of diabetes 5.7-6.4%    Consistent with increased risk for diabetes             (prediabetes) > or =6.5%  Consistent with diabetes . This assay result is consistent with a decreased risk of diabetes. . Currently, no consensus exists regarding use of hemoglobin A1c for diagnosis of diabetes in children. . According to American Diabetes Association (ADA) guidelines, hemoglobin A1c <7.0% represents optimal control in non-pregnant diabetic patients. Different metrics may apply to specific patient populations.  Standards of Medical Care in Diabetes(ADA). .     CBG: Recent Labs  Lab 07/02/20 1734 07/02/20 2102 07/03/20 0010 07/03/20 0330 07/03/20 0733  GLUCAP 187* 249* 235* 184* 163*    Recent Results (from the past 240 hour(s))  Resp Panel by RT-PCR (Flu A&B, Covid) Nasopharyngeal Swab     Status: None   Collection Time: 06/26/20 11:18 PM   Specimen: Nasopharyngeal Swab; Nasopharyngeal(NP) swabs in vial transport medium  Result Value Ref Range Status   SARS Coronavirus 2 by RT PCR NEGATIVE NEGATIVE Final    Comment: (NOTE) SARS-CoV-2 target nucleic acids are NOT DETECTED.  The SARS-CoV-2 RNA is generally detectable in upper respiratory specimens during the  acute phase of infection. The lowest concentration of SARS-CoV-2 viral copies this assay can detect is 138 copies/mL. A negative result does not preclude SARS-Cov-2 infection and should not be used as the sole basis for treatment or other patient management decisions. A negative result may occur with  improper specimen collection/handling, submission of specimen other than nasopharyngeal swab, presence of viral mutation(s) within the areas targeted by this assay, and inadequate number of viral copies(<138 copies/mL). A negative result must be combined with clinical  observations, patient history, and epidemiological information. The expected result is Negative.  Fact Sheet for Patients:  EntrepreneurPulse.com.au  Fact Sheet for Healthcare Providers:  IncredibleEmployment.be  This test is no t yet approved or cleared by the [place] FDA and  has been authorized for detection and/or diagnosis of SARS-CoV-2 by FDA under an Emergency Use Authorization (EUA). This EUA will remain  in effect (meaning this test can be used) for the duration of the COVID-19 declaration under Section 564(b)(1) of the Act, 21 U.S.C.section 360bbb-3(b)(1), unless the authorization is terminated  or revoked sooner.       Influenza A by PCR NEGATIVE NEGATIVE Final   Influenza B by PCR NEGATIVE NEGATIVE Final    Comment: (NOTE) The Xpert Xpress SARS-CoV-2/FLU/RSV plus assay is intended as an aid in the diagnosis of influenza from Nasopharyngeal swab specimens and should not be used as a sole basis for treatment. Nasal washings and aspirates are unacceptable for Xpert Xpress SARS-CoV-2/FLU/RSV testing.  Fact Sheet for Patients: EntrepreneurPulse.com.au  Fact Sheet for Healthcare Providers: IncredibleEmployment.be  This test is not yet approved or cleared by the [place] FDA and has been authorized for detection and/or  diagnosis of SARS-CoV-2 by FDA under an Emergency Use Authorization (EUA). This EUA will remain in effect (meaning this test can be used) for the duration of the COVID-19 declaration under Section 564(b)(1) of the Act, 21 U.S.C. section 360bbb-3(b)(1), unless the authorization is terminated or revoked.  Performed at [facility], [address], [place], [place] [address]   MRSA PCR Screening     Status: None   Collection Time: 06/27/20  3:58 PM   Specimen: Nasopharyngeal  Result Value Ref Range Status   MRSA by PCR NEGATIVE NEGATIVE Final    Comment:        The GeneXpert MRSA Assay (FDA approved for NASAL specimens only), is one component of a comprehensive MRSA colonization surveillance program. It is not intended to diagnose MRSA infection nor to guide or monitor treatment for MRSA infections. Performed at [facility], [place] [address], [place], [place] [address]      Scheduled Meds: . atorvastatin  10 mg Oral q1800  . dexamethasone  4 mg Oral Q6H  . heparin injection (subcutaneous)  5,000 Units Subcutaneous Q8H  . insulin aspart  0-20 Units Subcutaneous TID WC  . insulin aspart  0-5 Units Subcutaneous QHS  . insulin glargine  12 Units Subcutaneous BID  . levETIRAcetam  500 mg Oral BID  . pantoprazole  40 mg Oral Q0600  . traZODone  50 mg Oral QHS      LOS: 7 days   Cherene Altes, MD [facility] Office  [phone] Pager - Text Page per Amion  If 7PM-7AM, please contact night-coverage per Amion 07/03/2020, 8:52 AM

## 2020-07-03 NOTE — Plan of Care (Signed)

## 2020-07-04 DIAGNOSIS — G9389 Other specified disorders of brain: Secondary | ICD-10-CM | POA: Diagnosis not present

## 2020-07-04 LAB — GLUCOSE, CAPILLARY
Glucose-Capillary: 263 mg/dL — ABNORMAL HIGH (ref 70–99)
Glucose-Capillary: 313 mg/dL — ABNORMAL HIGH (ref 70–99)
Glucose-Capillary: 268 mg/dL — ABNORMAL HIGH (ref 70–99)
Glucose-Capillary: 279 mg/dL — ABNORMAL HIGH (ref 70–99)

## 2020-07-04 MED ORDER — INSULIN GLARGINE 100 UNIT/ML ~~LOC~~ SOLN
16.0000 [IU] | Freq: Two times a day (BID) | SUBCUTANEOUS | Status: DC
  Administered 2020-07-04 – 2020-07-05 (×3): 16 [IU] via SUBCUTANEOUS
  Filled 2020-07-04 (×4): qty 0.16

## 2020-07-04 NOTE — Progress Notes (Signed)
Physical Therapy Treatment Patient Details Name: Thomas Garcia MRN: [id number] DOB: [birth year] Today's Date: 07/04/2020    History of Present Illness 80 yo male presenting 3/20 with c/o general weakness, fatigue, nausea, and loss of appetite. Upon work-up, pt found to have right occipital cortex with hemorrhage with 46mm midline shift. MRI on 3/23 showed "large multilobulated hemorrhagic lesion in right occipital lobe". PMH includes: lung cancer, HTN, DM II, PAD, L BKA.    PT Comments    The pt was seen today for PT session with focus on exercises to target LE strength and stability. The pt was educated on a series of exercises for LE strengthening as described below, was able to demo with good technique and control. The pt was then given HEP handout as he reports he would be unable to recall exercises after session without written reminders. The pt remains slightly anxious regarding standing or other transfers, but was able to practice 2 x 5 sit-stands from recliner with use of BUE on bed rail for support/stability. The pt expressed good understanding of exercises, and was appreciative. Will continue to benefit from skilled PT to progress functional strength and stability to maintain independence with transfers as needed for mobility at home.     Follow Up Recommendations  No PT follow up     Equipment Recommendations  None recommended by PT (pt well equipped)    Recommendations for Other Services       Precautions / Restrictions Precautions Precautions: Fall Precaution Comments: L BKA at baseline Restrictions Weight Bearing Restrictions: No    Mobility  Bed Mobility Overal bed mobility: Independent                  Transfers Overall transfer level: Needs assistance   Transfers: Sit to/from Stand Sit to Stand: Min guard (with bed rail)         General transfer comment: minG with 2 x 5 sit-stand from recliner with use of bed rail in front of pt to provide UE  support  Ambulation/Gait             General Gait Details: deferred, pt reports WC level at baseline         Balance Overall balance assessment: Mild deficits observed, not formally tested                                          Cognition Arousal/Alertness: Awake/alert Behavior During Therapy: [facility] for tasks assessed/performed;Anxious Overall Cognitive Status: No family/caregiver present to determine baseline cognitive functioning Area of Impairment: Attention;Safety/judgement;Awareness;Problem solving;Following commands                   Current Attention Level: Sustained   Following Commands: Follows one step commands with increased time Safety/Judgement: Decreased awareness of safety;Decreased awareness of deficits Awareness: Emergent;Intellectual Problem Solving: Slow processing;Requires verbal cues General Comments: Pt still verbalizing some anxiety with mobility, verbalizing understanding to all education and HEP, but needing frequent reminders for completion and safety with execution      Exercises General Exercises - Lower Extremity Gluteal Sets: AROM;Both Long Arc Quad: Strengthening;Right;10 reps (against  mod resistance) Hip Flexion/Marching: AROM;Both;10 reps;Seated Heel Raises: AROM;Both;10 reps;Seated    General Comments General comments (skin integrity, edema, etc.): VSS through session      Pertinent Vitals/Pain Pain Assessment: No/denies pain    Home Living  Prior Function            PT Goals (current goals can now be found in the care plan section) Acute Rehab PT Goals Patient Stated Goal: return home PT Goal Formulation: With patient Time For Goal Achievement: 07/15/20 Potential to Achieve Goals: Good Progress towards PT goals: Progressing toward goals    Frequency    Min 3X/week      PT Plan Current plan remains appropriate    Co-evaluation              AM-PAC PT  "6 Clicks" Mobility   Outcome Measure  Help needed turning from your back to your side while in a flat bed without using bedrails?: None Help needed moving from lying on your back to sitting on the side of a flat bed without using bedrails?: None Help needed moving to and from a bed to a chair (including a wheelchair)?: A Little Help needed standing up from a chair using your arms (e.g., wheelchair or bedside chair)?: A Little Help needed to walk in hospital room?: Total Help needed climbing 3-5 steps with a railing? : Total 6 Click Score: 16    End of Session Equipment Utilized During Treatment: Gait belt Activity Tolerance: Patient tolerated treatment well Patient left: in chair;with call bell/phone within reach;with chair alarm set Nurse Communication: Mobility status PT Visit Diagnosis: Other abnormalities of gait and mobility (R26.89)     Time: 1470-9295 PT Time Calculation (min) (ACUTE ONLY): 43 min  Charges:  $Therapeutic Exercise: 23-37 mins $Therapeutic Activity: 8-22 mins                     Karma Ganja, PT, DPT   Acute Rehabilitation Department Pager #: [phone]   Otho Bellows 07/04/2020, 3:30 PM

## 2020-07-04 NOTE — Progress Notes (Signed)
Occupational Therapy Treatment Patient Details Name: Thomas Garcia MRN: [id number] DOB: [birth year] Today's Date: 07/04/2020    History of present illness 80 yo male presenting 3/20 with c/o general weakness, fatigue, nausea, and loss of appetite. Upon work-up, pt found to have right occipital cortex with hemorrhage with 77mm midline shift. MRI on 3/23 showed "large multilobulated hemorrhagic lesion in right occipital lobe". PMH includes: lung cancer, HTN, DM II, PAD, L BKA.   OT comments  Pt making steady progress towards OT goals this session. Pt continues to present with cognitive deficits, decreased activity tolerance and impaired balance. Pt currently requires MIN A for squat pivot transfer to recliner to pts R side, total A for LB ADLs and MIN A for UB ADLs. Pt with some mild cognitive deficits noted ( see cog section) continue to question visual deficits however pt only able to tell this COTA that he sees "a little man come out of the cabinet over there" when asked about changes in his vision. Will continue to assess vision. Pt would continue to benefit from skilled occupational therapy while admitted and after d/c to address the below listed limitations in order to improve overall functional mobility and facilitate independence with BADL participation. DC plan remains appropriate, will follow acutely per POC.     Follow Up Recommendations  Home health OT;Supervision/Assistance - 24 hour    Equipment Recommendations  None recommended by OT    Recommendations for Other Services      Precautions / Restrictions Precautions Precautions: Fall Precaution Comments: L BKA at baseline Restrictions Weight Bearing Restrictions: No       Mobility Bed Mobility Overal bed mobility: Modified Independent;Needs Assistance Bed Mobility: Rolling;Supine to Sit Rolling: Min assist   Supine to sit: Min guard;HOB elevated     General bed mobility comments: MIN A to fully roll into sidelying  with use of bed pad, HOB elevated with tactile cues to sequence task, increased time needed, min guard for safety and line mgmt    Transfers Overall transfer level: Needs assistance Equipment used: None Transfers: Squat Pivot Transfers Sit to Stand: Min guard (with bed rail)   Squat pivot transfers: Min assist     General transfer comment: initially wanted to work on lateral scoot transfer to recliner to pts R side however pt reports he prefers to pivot on RLE, pt performed squat pivot transfer to pts R side with MIN A and MOD cues for safety awareness and sequencing of task.    Balance Overall balance assessment: Needs assistance Sitting-balance support: No upper extremity supported;Feet supported Sitting balance-Leahy Scale: Fair Sitting balance - Comments: sitting EOB for dressing with no LOB     Standing balance-Leahy Scale: Zero                             ADL either performed or assessed with clinical judgement   ADL Overall ADL's : Needs assistance/impaired             Lower Body Bathing: Total assistance;Bed level Lower Body Bathing Details (indicate cue type and reason): simulated via posterior pericare from bed level Upper Body Dressing : Minimal assistance;Sitting Upper Body Dressing Details (indicate cue type and reason): to don new gown Lower Body Dressing: Total assistance;Sitting/lateral leans Lower Body Dressing Details (indicate cue type and reason): to don sock from EOB Toilet Transfer: Minimal assistance;Squat-pivot;Cueing for sequencing;Cueing for safety Toilet Transfer Details (indicate cue type and reason): simulated via squat  pivot to drop arm recliner to pts R side, cues needed for sequencing and safety Toileting- Clothing Manipulation and Hygiene: Total assistance;Bed level Toileting - Clothing Manipulation Details (indicate cue type and reason): total A for posterior pericare from bed level     Functional mobility during ADLs: Minimal  assistance General ADL Comments: pt continues to present with cognitive deficits, decreased activity tolerance and impaired balance     Vision   Additional Comments: when asked about vision pt says "yes there something wrong with my vision, I see a little man over there that comes out of the cabinet"   Perception     Praxis      Cognition Arousal/Alertness: Awake/alert Behavior During Therapy: [facility] for tasks assessed/performed;Anxious Overall Cognitive Status: No family/caregiver present to determine baseline cognitive functioning Area of Impairment: Attention;Safety/judgement;Awareness;Problem solving;Following commands;Orientation                 Orientation Level: Disoriented to;Situation (pt reports she sees a little man come out of the cabinet) Current Attention Level: Sustained   Following Commands: Follows one step commands with increased time Safety/Judgement: Decreased awareness of safety;Decreased awareness of deficits Awareness: Emergent;Intellectual Problem Solving: Slow processing;Requires verbal cues General Comments: pt seeming to be slightly paranoid as pt states I will be the one to get him kicked out of his house when I began asking pt about PLOF. pt additionallly stating that he sees a little man come out of the cabinet when this COTA began to assess pts vision. pt using calendar to answer orientation questions. pt continues to have anxiety with mobility but able to work through it        Exercises   Shoulder Instructions       General Comments VSS throughout session    Pertinent Vitals/ Pain       Pain Assessment: No/denies pain  Home Living                                          Prior Functioning/Environment              Frequency  Min 2X/week        Progress Toward Goals  OT Goals(current goals can now be found in the care plan section)  Progress towards OT goals: Progressing toward goals  Acute Rehab OT  Goals Patient Stated Goal: return home OT Goal Formulation: With patient Time For Goal Achievement: 07/15/20 Potential to Achieve Goals: Good  Plan Discharge plan remains appropriate;Frequency remains appropriate    Co-evaluation                 AM-PAC OT "6 Clicks" Daily Activity     Outcome Measure   Help from another person eating meals?: None Help from another person taking care of personal grooming?: A Little Help from another person toileting, which includes using toliet, bedpan, or urinal?: A Little Help from another person bathing (including washing, rinsing, drying)?: A Little Help from another person to put on and taking off regular upper body clothing?: None Help from another person to put on and taking off regular lower body clothing?: A Little 6 Click Score: 20    End of Session Equipment Utilized During Treatment: Gait belt  OT Visit Diagnosis: Other abnormalities of gait and mobility (R26.89);Unsteadiness on feet (R26.81);Muscle weakness (generalized) (M62.81)   Activity Tolerance Patient tolerated treatment well   Patient Left in  chair;with call bell/phone within reach;with chair alarm set   Nurse Communication Mobility status        Time: 6010-9323 OT Time Calculation (min): 27 min  Charges: OT General Charges $OT Visit: 1 Visit OT Treatments $Self Care/Home Management : 23-37 mins  Harley Alto., COTA/L Acute Rehabilitation Services [phone]    Precious Haws 07/04/2020, 4:42 PM

## 2020-07-04 NOTE — Progress Notes (Signed)
   Providing Compassionate, Quality Care - Together  NEUROSURGERY PROGRESS NOTE   S: No issues overnight  O: EXAM:  BP (!) 141/64 (BP Location: Left Arm)   Pulse 84   Temp 98.3 F (36.8 C) (Oral)   Resp 15   Ht 5\' 9"  (1.753 m)   Wt 81.6 kg   SpO2 94%   BMI 26.58 kg/m   Awake alert oriented x3 Poor dentition No acute distress Face symmetric PERRLA EOMI Left-sided neglect Left hemianopsia No drift Bilateral upper extremities 4+/5 Bilateral lower extremities 4+5 (except for left BKA) Sensory intact light touch   ASSESSMENT: 80 y.o.malewith   1. Right occipital tumor  PLAN: -OR Friday am  -Decadron -keppra -pain control -HOLD PLAVIX -CT CAP - likely lung mets -Okay for DVT prophylaxis with subQheparin   Unfortunately I cannot get him into surgery any sooner than Friday as there are limited operating room staff and equipment available this week.  If an opening becomes available certainly I will try to squeeze him in however it appears that Friday a.m. is the earliest possible at this time.   Thank you for allowing me to participate in this patient's care.  Please do not hesitate to call with questions or concerns.   Elwin Sleight, [facility] Cell: [phone]

## 2020-07-04 NOTE — Progress Notes (Addendum)
Thomas Garcia  XNA:[id number] DOB: March 02, 1941 DOA: 06/26/2020 PCP: Doree Albee, MD    Brief Narrative:  80yo with a hx of HTN, DM 2, and PAD who presented to the ER with severe generalized weakness and fatigue, nausea, loss of appetite, and scant hemoptysis. 1 month prior he presented to an ER with shortness of breath and 6 months of hemoptysis and was found to have small bilateral pulmonary emboli as well as a masslike consolidation in the right upper lobe with cavitation.  He refused admission at that time.  Upon his return to the ER for this admission a CT head revealed evidence of a right occipital mass.  Antimicrobials:  Rocephin 3/21 >3/25  DVT prophylaxis: Subcutaneous heparin  Consultants:  Pulmonary Neurosurgery  Subjective: Awaiting craniotomy.  Vitals stable.  CBG remains quite variable and not yet at goal. Pt is alert and pleasant, but appears to be very mildly confused at times. Denies cp, n/v, or abdom pain..   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 Was treated with empiric antibiotic for possible postobstructive pneumonia - Pulmonary following peripherally- if develops gross hemoptysis will need IR directed embolization  Right occipital mass -suspected metastatic lung cancer Complicated by mild hemorrhage and vasogenic edema resulting in a 2 mm right-to-left shift - Neurosurgery following - continue decadron and empiric keppra - craniotomy timing as per Neurosurgery with Plavix on hold  Pulmonary embolism Full anticoagulation on hold in setting of hemoptysis and bleeding from brain mass - no evidence of DVT on bilateral lower extremity venous duplex - began SQ heparin at prophy dose 3/25 - monitor closely for bleeding - will ask IR to evaluate for a retrievable IVC filter to be used perioperatively - I discussed a filter w/ the patient today  Chronic iron deficiency anemia Likely a consequence of his previously undiagnosed cancer -  no evidence of gross blood loss at this time   PAD Status post left BKA - Plavix necessarily on hold due to above - continue statin - followed by Dr. Carlis Abbott  HTN Blood pressure reasonably controlled   Uncontrolled DM 2 w/ hyperglycemia A1c 6.4 - CBG trending upward - adjust tx again today and follow   GERD Well compensated  Hyponatremia Corrected  Code Status: FULL CODE Family Communication: No family present at time of exam Status is: Inpatient  Remains inpatient appropriate because:Inpatient level of care appropriate due to severity of illness   Dispo: The patient is from: Home              Anticipated d/c is to: unclear              Patient currently is not medically stable to d/c.   Difficult to place patient No     Objective: Blood pressure (!) 153/66, pulse 69, temperature 98.6 F (37 C), temperature source Oral, resp. rate 20, height 5\' 9"  (1.753 m), weight 81.6 kg, SpO2 97 %.  Intake/Output Summary (Last 24 hours) at 07/04/2020 0840 Last data filed at 07/04/2020 0500 Gross per 24 hour  Intake --  Output 1025 ml  Net -1025 ml   Filed Weights   06/26/20 1856  Weight: 81.6 kg    Examination: General: No acute respiratory distress Lungs: Good air movement bilaterally - no wheeze  Cardiovascular: RRR without murmur Abdomen: NT/ND, soft, BS positive, no rebound Extremities: No edema lower extremity good future/stump  CBC: Recent Labs  Lab 06/28/20 0153 06/28/20 1024 06/28/20 1948 06/29/20 0306 07/02/20  0057  WBC 10.8*  --   --  16.4* 18.5*  HGB 7.1*   < > 8.3* 8.6* 9.7*  HCT 24.8*   < > 27.8* 28.3* 33.1*  MCV 74.0*  --   --  75.3* 77.5*  PLT 683*  --   --  655* 574*   < > = values in this interval not displayed.   Basic Metabolic Panel: Recent Labs  Lab 06/28/20 0153 06/29/20 0306  NA 131* 136  K 3.8 4.0  CL 100 104  CO2 22 27  GLUCOSE 288* 234*  BUN 24* 18  CREATININE 0.85 0.70  CALCIUM 8.5* 8.6*  MG  --  2.3   GFR: Estimated  Creatinine Clearance: 77.3 mL/min (by C-G formula based on SCr of 0.7 mg/dL).  Liver Function Tests: No results for input(s): AST, ALT, ALKPHOS, BILITOT, PROT, ALBUMIN in the last 168 hours.  Coagulation Profile: No results for input(s): INR, PROTIME in the last 168 hours.  HbA1C: Hgb A1c MFr Bld  Date/Time Value Ref Range Status  06/27/2020 05:29 AM 6.4 (H) 4.8 - 5.6 % Final    Comment:    (NOTE) Pre diabetes:          5.7%-6.4%  Diabetes:              >6.4%  Glycemic control for   <7.0% adults with diabetes   04/14/2020 12:00 AM 5.6 <5.7 % of total Hgb Final    Comment:    For the purpose of screening for the presence of diabetes: . <5.7%       Consistent with the absence of diabetes 5.7-6.4%    Consistent with increased risk for diabetes             (prediabetes) > or =6.5%  Consistent with diabetes . This assay result is consistent with a decreased risk of diabetes. . Currently, no consensus exists regarding use of hemoglobin A1c for diagnosis of diabetes in children. . According to American Diabetes Association (ADA) guidelines, hemoglobin A1c <7.0% represents optimal control in non-pregnant diabetic patients. Different metrics may apply to specific patient populations.  Standards of Medical Care in Diabetes(ADA). .     CBG: Recent Labs  Lab 07/03/20 0330 07/03/20 0733 07/03/20 1130 07/03/20 1727 07/03/20 2247  GLUCAP 184* 163* 229* 287* 316*    Recent Results (from the past 240 hour(s))  Resp Panel by RT-PCR (Flu A&B, Covid) Nasopharyngeal Swab     Status: None   Collection Time: 06/26/20 11:18 PM   Specimen: Nasopharyngeal Swab; Nasopharyngeal(NP) swabs in vial transport medium  Result Value Ref Range Status   SARS Coronavirus 2 by RT PCR NEGATIVE NEGATIVE Final    Comment: (NOTE) SARS-CoV-2 target nucleic acids are NOT DETECTED.  The SARS-CoV-2 RNA is generally detectable in upper respiratory specimens during the acute phase of infection. The  lowest concentration of SARS-CoV-2 viral copies this assay can detect is 138 copies/mL. A negative result does not preclude SARS-Cov-2 infection and should not be used as the sole basis for treatment or other patient management decisions. A negative result may occur with  improper specimen collection/handling, submission of specimen other than nasopharyngeal swab, presence of viral mutation(s) within the areas targeted by this assay, and inadequate number of viral copies(<138 copies/mL). A negative result must be combined with clinical observations, patient history, and epidemiological information. The expected result is Negative.  Fact Sheet for Patients:  EntrepreneurPulse.com.au  Fact Sheet for Healthcare Providers:  IncredibleEmployment.be  This test is no  t yet approved or cleared by the [place] and  has been authorized for detection and/or diagnosis of SARS-CoV-2 by FDA under an Emergency Use Authorization (EUA). This EUA will remain  in effect (meaning this test can be used) for the duration of the COVID-19 declaration under Section 564(b)(1) of the Act, 21 U.S.C.section 360bbb-3(b)(1), unless the authorization is terminated  or revoked sooner.       Influenza A by PCR NEGATIVE NEGATIVE Final   Influenza B by PCR NEGATIVE NEGATIVE Final    Comment: (NOTE) The Xpert Xpress SARS-CoV-2/FLU/RSV plus assay is intended as an aid in the diagnosis of influenza from Nasopharyngeal swab specimens and should not be used as a sole basis for treatment. Nasal washings and aspirates are unacceptable for Xpert Xpress SARS-CoV-2/FLU/RSV testing.  Fact Sheet for Patients: EntrepreneurPulse.com.au  Fact Sheet for Healthcare Providers: IncredibleEmployment.be  This test is not yet approved or cleared by the [place] FDA and has been authorized for detection and/or diagnosis of SARS-CoV-2 by FDA under  an Emergency Use Authorization (EUA). This EUA will remain in effect (meaning this test can be used) for the duration of the COVID-19 declaration under Section 564(b)(1) of the Act, 21 U.S.C. section 360bbb-3(b)(1), unless the authorization is terminated or revoked.  Performed at [facility], [address], [place], [place] [address]   MRSA PCR Screening     Status: None   Collection Time: 06/27/20  3:58 PM   Specimen: Nasopharyngeal  Result Value Ref Range Status   MRSA by PCR NEGATIVE NEGATIVE Final    Comment:        The GeneXpert MRSA Assay (FDA approved for NASAL specimens only), is one component of a comprehensive MRSA colonization surveillance program. It is not intended to diagnose MRSA infection nor to guide or monitor treatment for MRSA infections. Performed at [facility], [place] [address], [place], [place] [address]      Scheduled Meds: . atorvastatin  10 mg Oral q1800  . dexamethasone  4 mg Oral Q6H  . heparin injection (subcutaneous)  5,000 Units Subcutaneous Q8H  . insulin aspart  0-20 Units Subcutaneous TID WC  . insulin aspart  0-5 Units Subcutaneous QHS  . insulin glargine  12 Units Subcutaneous BID  . levETIRAcetam  500 mg Oral BID  . pantoprazole  40 mg Oral Q0600  . traZODone  50 mg Oral QHS      LOS: 8 days   Cherene Altes, MD [facility] Office  [phone] Pager - Text Page per Amion  If 7PM-7AM, please contact night-coverage per Amion 07/04/2020, 8:40 AM

## 2020-07-05 ENCOUNTER — Inpatient Hospital Stay (HOSPITAL_COMMUNITY): Payer: Medicare HMO

## 2020-07-05 DIAGNOSIS — G9389 Other specified disorders of brain: Secondary | ICD-10-CM | POA: Diagnosis not present

## 2020-07-05 HISTORY — PX: IR IVC FILTER PLMT / S&I /IMG GUID/MOD SED: IMG701

## 2020-07-05 LAB — BASIC METABOLIC PANEL
Anion gap: 4 — ABNORMAL LOW (ref 5–15)
BUN: 25 mg/dL — ABNORMAL HIGH (ref 8–23)
CO2: 27 mmol/L (ref 22–32)
Calcium: 8.4 mg/dL — ABNORMAL LOW (ref 8.9–10.3)
Chloride: 105 mmol/L (ref 98–111)
Creatinine, Ser: 0.77 mg/dL (ref 0.61–1.24)
GFR, Estimated: >60 mL/min (ref >60)
Glucose, Bld: 239 mg/dL — ABNORMAL HIGH (ref 70–99)
Potassium: 4.7 mmol/L (ref 3.5–5.1)
Sodium: 136 mmol/L (ref 135–145)

## 2020-07-05 LAB — GLUCOSE, CAPILLARY
Glucose-Capillary: 266 mg/dL — ABNORMAL HIGH (ref 70–99)
Glucose-Capillary: 177 mg/dL — ABNORMAL HIGH (ref 70–99)
Glucose-Capillary: 239 mg/dL — ABNORMAL HIGH (ref 70–99)
Glucose-Capillary: 218 mg/dL — ABNORMAL HIGH (ref 70–99)
Glucose-Capillary: 244 mg/dL — ABNORMAL HIGH (ref 70–99)

## 2020-07-05 LAB — CBC
HCT: 29.7 % — ABNORMAL LOW (ref 39.0–52.0)
Hemoglobin: 9.2 g/dL — ABNORMAL LOW (ref 13.0–17.0)
MCH: 23.2 pg — ABNORMAL LOW (ref 26.0–34.0)
MCHC: 31 g/dL (ref 30.0–36.0)
MCV: 74.8 fL — ABNORMAL LOW (ref 80.0–100.0)
Platelets: 452 10*3/uL — ABNORMAL HIGH (ref 150–400)
RBC: 3.97 MIL/uL — ABNORMAL LOW (ref 4.22–5.81)
RDW: 21 % — ABNORMAL HIGH (ref 11.5–15.5)
WBC: 19.3 10*3/uL — ABNORMAL HIGH (ref 4.0–10.5)
nRBC: 0.9 % — ABNORMAL HIGH (ref 0.0–0.2)

## 2020-07-05 IMAGING — XA IR IVC FILTER PLMT / S&I /IMG GUID/MOD SED
5 series · 14 of 21 positions shown · IV contrast (IODINE)
Comparison: none

CLINICAL DATA: 79-year-old male with history of subclinical
pulmonary emboli and brain mass with forthcoming neuro surgical
intervention

[Series 1: ir ivc filter plmt / s&i /img guid/mod sed · 1 of 2 slices shown]
[im 1/2]
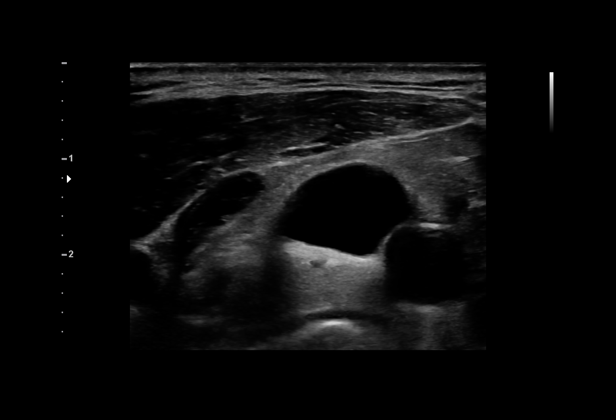

[Series 1: fl (-) angio · 1 of 1 slices shown (1 of 2)]
[im 1/1]
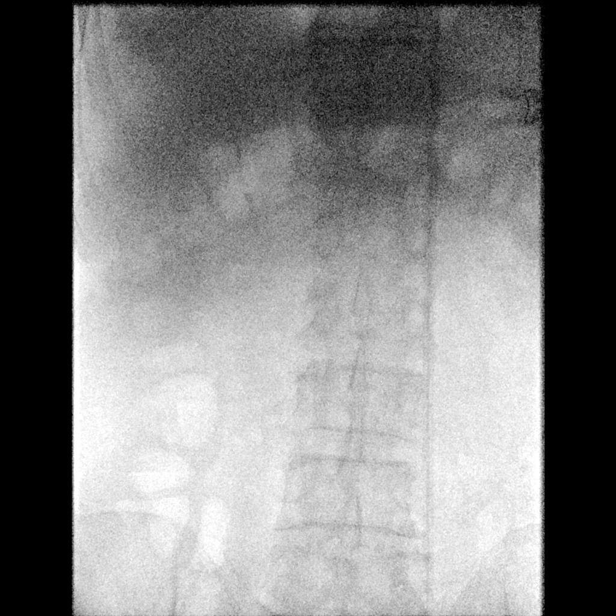

[Series 2: co2 evenflow · 5 acquisitions, 5 frames shown]
[im 1/5]
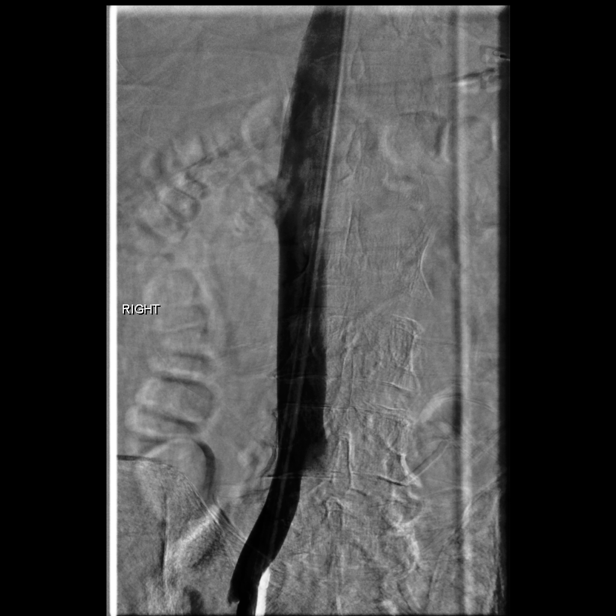
[im 1/5]
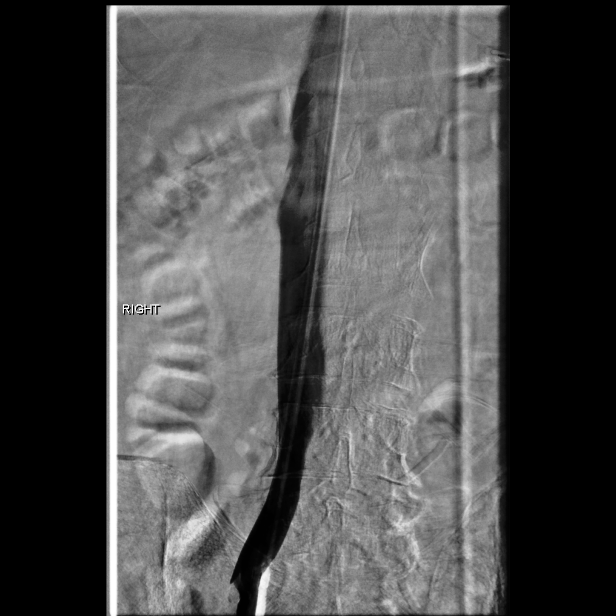
[im 1/5  full-range]
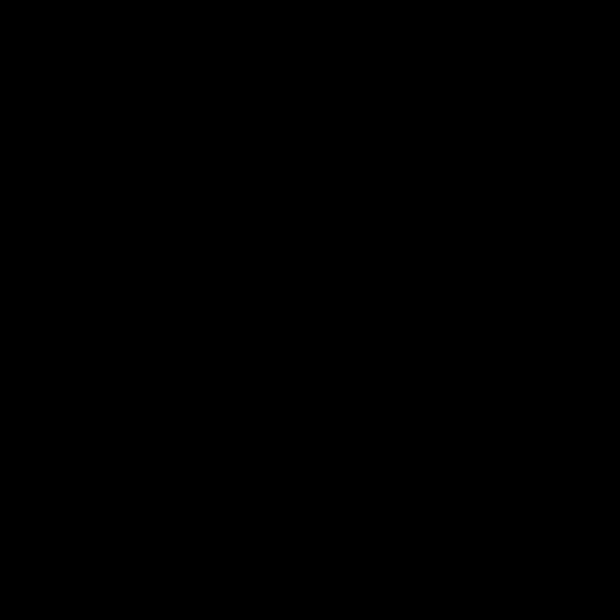
[im 3/5]
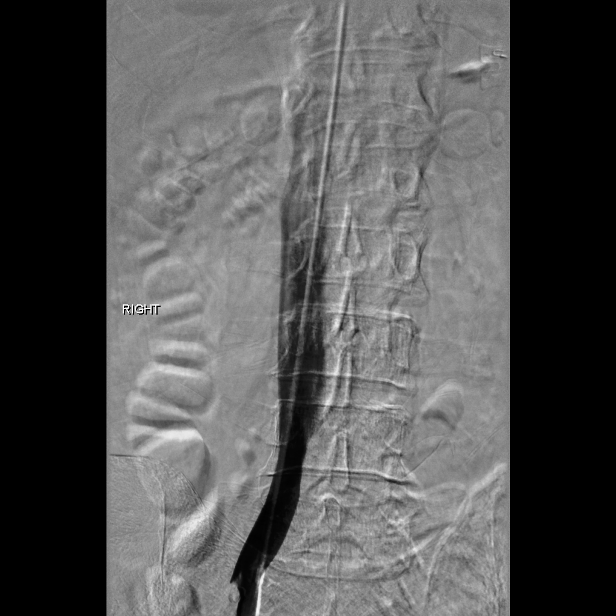
[im 4/5]
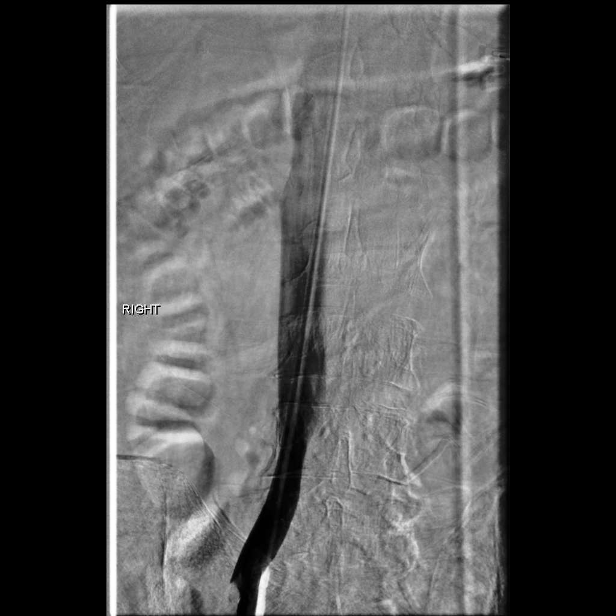

[Series 3: fl (-) angio · 3 of 33 frames shown (2 of 2)]
[frame 5/33]
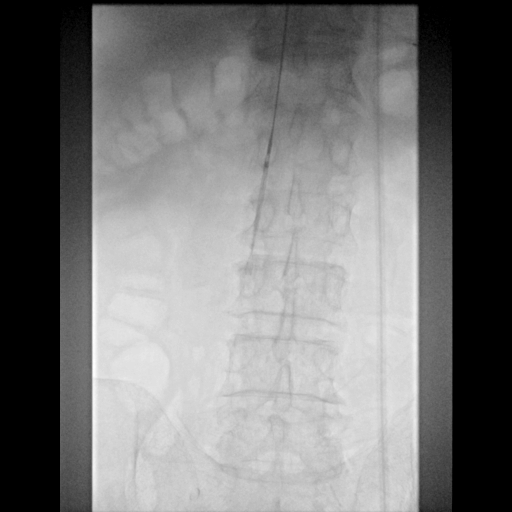
[frame 17/33]
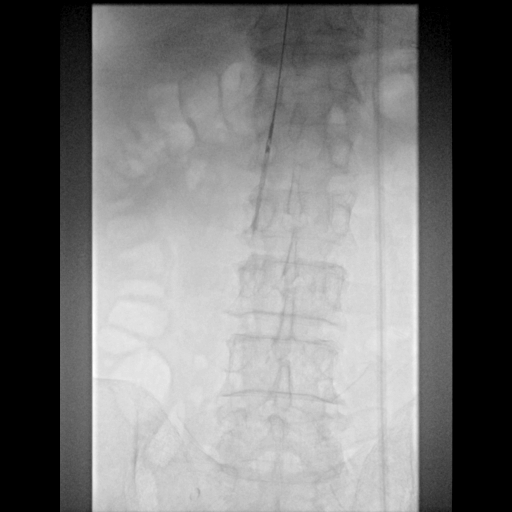
[frame 33/33]
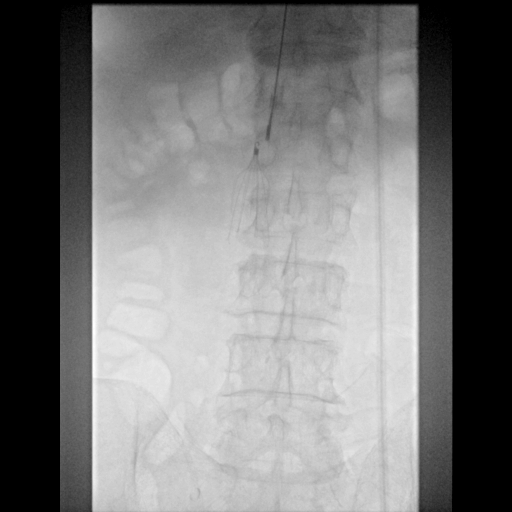

[Series 4: body 4 care · 3 acquisitions, 4 frames shown]
[im 1/3]
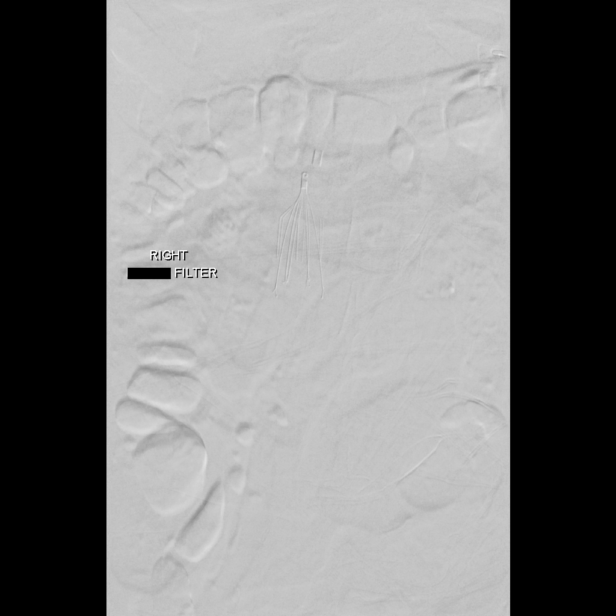
[im 1/3]
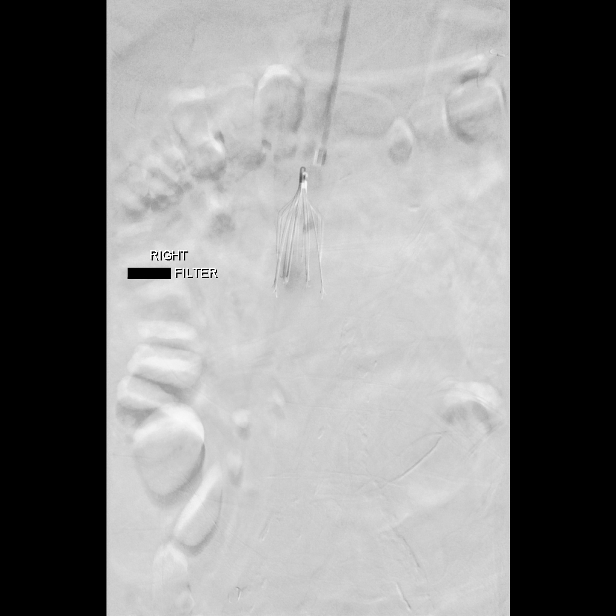
[im 1/3  full-range]
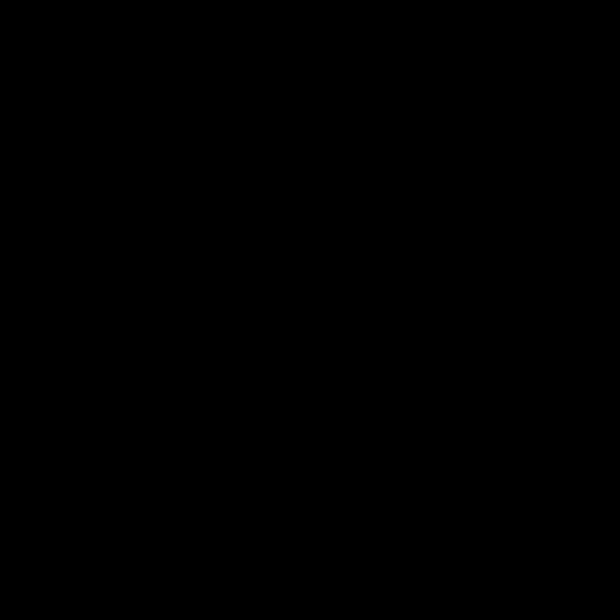
[im 3/3]
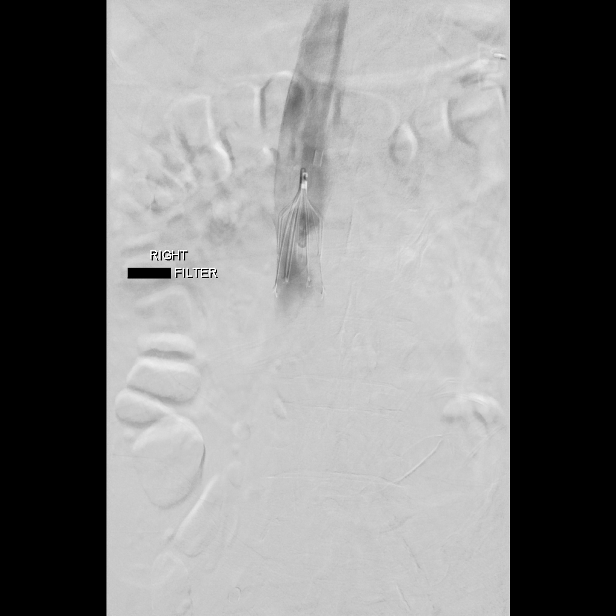

[14 of 21 positions shown; findings below may reference images not displayed]

EXAM:
1. ULTRASOUND GUIDANCE FOR VASCULAR ACCESS OF THE RIGHT  VEIN.
2. IVC VENOGRAM.
3. PERCUTANEOUS IVC FILTER PLACEMENT.

ANESTHESIA/SEDATION:
One mg IV Versed; 25 mcg IV Fentanyl.

Total Moderate Sedation Time

Tenminutes.

CONTRAST:  50mL OMNIPAQUE IOHEXOL 300 MG/ML  SOLN

FLUOROSCOPY TIME:  54 seconds, 98 mGy

PROCEDURE:
The procedure, risks, benefits, and alternatives were explained to
the patient. Questions regarding the procedure were encouraged and
answered. The patient understands and consents to the procedure.

The patient was prepped with Betadine in a sterile fashion, and a
sterile drape was applied covering the operative field. A sterile
gown and sterile gloves were used for the procedure. Local
anesthesia was provided with 1% Lidocaine.

Under direct ultrasound guidance, a 21 gauge needle was advanced
into the right internal jugular vein with ultrasound image
documentation performed. After securing access with a micropuncture
dilator, a guidewire was advanced into the inferior vena cava. A
deployment sheath was advanced over the guidewire. This was utilized
to perform IVC venography.

The deployment sheath was further positioned in an appropriate
location for filter deployment. A Denali IVC filter was then
advanced in the sheath. This was then fully deployed in the
infrarenal IVC. Final filter position was confirmed with a
fluoroscopic spot image. Contrast injection was also performed
through the sheath under fluoroscopy to confirm patency of the IVC
at the level of the filter. After the procedure the sheath was
removed and hemostasis obtained with manual compression.

COMPLICATIONS:
None.
FINDINGS: IVC venography demonstrates a normal caliber IVC with no evidence of
thrombus. Renal veins are identified bilaterally. The IVC filter was
successfully positioned below the level of the renal veins and is
appropriately oriented. This IVC filter has both permanent and
retrievable indications.
IMPRESSION: Placement of percutaneous IVC filter in infrarenal IVC. IVC venogram
shows no evidence of IVC thrombus and normal caliber of the inferior
vena cava. This filter does have both permanent and retrievable
indications.

PLAN:
This IVC filter is potentially retrievable. The patient will be
approximately 8-12 weeks. Further recommendations regarding filter
retrieval, continued surveillance or declaration of device
permanence, will be made at that time.

## 2020-07-05 MED ORDER — LIDOCAINE HCL 1 % IJ SOLN
INTRAMUSCULAR | Status: AC
  Filled 2020-07-05: qty 20

## 2020-07-05 MED ORDER — IOHEXOL 300 MG/ML  SOLN
150.0000 mL | Freq: Once | INTRAMUSCULAR | Status: AC | PRN
  Administered 2020-07-05: 50 mL via INTRAVENOUS

## 2020-07-05 MED ORDER — MIDAZOLAM HCL 2 MG/2ML IJ SOLN
INTRAMUSCULAR | Status: AC
  Filled 2020-07-05: qty 4

## 2020-07-05 MED ORDER — INSULIN ASPART 100 UNIT/ML ~~LOC~~ SOLN
2.0000 [IU] | Freq: Three times a day (TID) | SUBCUTANEOUS | Status: DC
  Administered 2020-07-05 – 2020-07-06 (×4): 2 [IU] via SUBCUTANEOUS

## 2020-07-05 MED ORDER — LIDOCAINE HCL (PF) 1 % IJ SOLN: INTRAMUSCULAR | Status: AC | PRN

## 2020-07-05 MED ORDER — INSULIN GLARGINE 100 UNIT/ML ~~LOC~~ SOLN
20.0000 [IU] | Freq: Two times a day (BID) | SUBCUTANEOUS | Status: DC
  Administered 2020-07-05 – 2020-07-06 (×2): 20 [IU] via SUBCUTANEOUS
  Filled 2020-07-05 (×3): qty 0.2

## 2020-07-05 MED ORDER — LISINOPRIL 5 MG PO TABS
5.0000 mg | ORAL_TABLET | Freq: Every day | ORAL | Status: DC
  Administered 2020-07-05 – 2020-07-07 (×3): 5 mg via ORAL
  Filled 2020-07-05 (×3): qty 1

## 2020-07-05 MED ORDER — SODIUM CHLORIDE 0.9 % IV SOLN
INTRAVENOUS | Status: AC | PRN
  Administered 2020-07-05: 250 mL via INTRAVENOUS

## 2020-07-05 MED ORDER — LIDOCAINE HCL 1 % IJ SOLN
INTRAMUSCULAR | Status: AC | PRN
  Administered 2020-07-05: 10 mL

## 2020-07-05 MED ORDER — FENTANYL CITRATE (PF) 100 MCG/2ML IJ SOLN
INTRAMUSCULAR | Status: AC
  Filled 2020-07-05: qty 2

## 2020-07-05 MED ORDER — FENTANYL CITRATE (PF) 100 MCG/2ML IJ SOLN
INTRAMUSCULAR | Status: AC | PRN
  Administered 2020-07-05: 25 ug via INTRAVENOUS

## 2020-07-05 MED ORDER — MIDAZOLAM HCL 2 MG/2ML IJ SOLN
INTRAMUSCULAR | Status: AC | PRN
  Administered 2020-07-05: 1 mg via INTRAVENOUS

## 2020-07-05 NOTE — Consult Note (Signed)
Chief Complaint: Patient was seen in consultation today for retrievable inferior vena cava filter placement Chief Complaint  Patient presents with  . Generalized Body Aches   at the request of Dr Sheral Flow  Supervising Physician: Ruthann Cancer  Patient Status: [facility] - In-pt  History of Present Illness: Thomas Garcia is a 80 y.o. 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  Neurosurgery consulted--- for surgery craniotomy Friday this week Off all anticoagulation - except Hep drip  MD requesting IVC filter awaiting OR Case reviewed with Dr Gunnar Fusi procedure  Scheduled now for same   Past Medical History:  Diagnosis Date  . Diabetes mellitus without complication ([place])   . Kidney stones   . PONV (postoperative nausea and vomiting)     Past Surgical History:  Procedure Laterality Date  . ABDOMINAL AORTOGRAM W/LOWER EXTREMITY N/A 09/17/2018   Procedure: ABDOMINAL AORTOGRAM W/LOWER EXTREMITY;  Surgeon: Marty Heck, MD;  Location: [place] [facility];  Service: Cardiovascular;  Laterality: N/A;  . AMPUTATION Left 09/22/2018   Procedure: AMPUTATION BELOW KNEE LEFT;  Surgeon: Marty Heck, MD;  Location: [place];  Service: Vascular;  Laterality: Left;  . AMPUTATION TOE Left 09/10/2018   Procedure: AMPUTATION OF THIRD TOE LEFT FOOT, DEBRIDEMENT OF ULCERS ON SECOND TOE AND PARTIAL AMPUTATION SECOND TOE ON LEFT FOOT;  Surgeon: Virl Cagey, MD;  Location: [facility];  Service: General;  Laterality: Left;  . APPLICATION OF WOUND VAC Left 09/18/2018   Procedure: APPLICATION OF WOUND VAC;  Surgeon: Marty Heck, MD;  Location: [place];  Service: Vascular;  Laterality: Left;  . CYSTOSCOPY  05/08/2012   Procedure: CYSTOSCOPY;  Surgeon: Marissa Nestle, MD;  Location: [facility];  Service: Urology;  Laterality: N/A;  Evacuation of clots  . KIDNEY SURGERY     sutures   . PERIPHERAL VASCULAR BALLOON ANGIOPLASTY  09/17/2018   Procedure: PERIPHERAL VASCULAR BALLOON ANGIOPLASTY;  Surgeon: Marty Heck, MD;  Location: [place] [facility];  Service: Cardiovascular;;  LT SFA and AT  . RADIOLOGY WITH ANESTHESIA N/A 06/30/2020   Procedure: MRI WITH ANESTHESIA   BRAIN WITH AND WITHOUT CONTRAST;  Surgeon: Radiologist, Medication, MD;  Location: [place];  Service: Radiology;  Laterality: N/A;  . SPLENECTOMY    . TRANSMETATARSAL AMPUTATION Left 09/18/2018   Procedure: TRANSMETATARSAL AMPUTATION LEFT FOOT;  Surgeon: Marty Heck, MD;  Location: [place];  Service: Vascular;  Laterality: Left;  . TRANSURETHRAL RESECTION OF PROSTATE  05/14/2012   Procedure: TRANSURETHRAL RESECTION OF THE PROSTATE (TURP);  Surgeon: Marissa Nestle, MD;  Location: [facility];  Service: Urology;  Laterality: N/A;    Allergies: Patient has no known allergies.  Medications: Prior to Admission medications   Medication Sig Start Date End Date Taking? Authorizing Provider  acetaminophen (TYLENOL) 325 MG tablet Take 2 tablets (650 mg total) by mouth every 4 (four) hours as needed for headache or mild pain. Patient taking differently: Take  650 mg by mouth daily. 09/29/18  Yes Angiulli, Lavon Paganini, PA-C  atorvastatin (LIPITOR) 10 MG tablet Take 1 tablet (10 mg total) by mouth daily at 6 PM. 04/14/20  Yes Gosrani, Nimish C, MD  Cal Carb-Mag Hydrox-Simeth (ROLAIDS ADVANCED) 1000-200-40 MG CHEW Chew 1 tablet by mouth daily as needed (for indigestion).   Yes [provider]  clopidogrel (PLAVIX) 75 MG tablet Take 1 tablet (75 mg total) by mouth daily. 04/14/20  Yes Gosrani, Nimish C, MD  ferrous sulfate 324 MG TBEC Take 324 mg by mouth daily with breakfast.   Yes [provider]  lisinopril (ZESTRIL) 5 MG tablet Take 1 tablet (5 mg total) by mouth daily. 04/14/20  Yes Gosrani, Nimish C, MD  metFORMIN (GLUCOPHAGE) 500 MG tablet Take 1 tablet (500 mg total) by mouth 2 (two) times daily with a meal. 04/14/20  Yes Gosrani, Nimish C, MD  traZODone (DESYREL) 50 MG tablet Take 1 tablet (50 mg total) by mouth at bedtime. 04/14/20  Yes Gosrani, Nimish C, MD  nicotine (NICODERM CQ) 14 mg/24hr patch Place 1 patch (14 mg total) onto the skin daily. Patient not taking: No sig reported 11/30/19   Doree Albee, MD     Family History  Problem Relation Age of Onset  . Diabetes Mother     Social History   Socioeconomic History  . Marital status: Legally Separated    Spouse name: Not on file  . Number of children: Not on file  . Years of education: Not on file  . Highest education level: Not on file  Occupational History  . Not on file  Tobacco Use  . Smoking status: Former Smoker    Packs/day: 0.50    Years: 45.00    Pack years: 22.50    Types: Cigarettes    Quit date: 05/20/2020    Years since quitting: 0.1  . Smokeless tobacco: Never Used  Vaping Use  . Vaping Use: Never used  Substance and Sexual Activity  . Alcohol use: No  . Drug use: No  . Sexual activity: Not on file  Other Topics Concern  . Not on file  Social History Narrative   Divorced since 2011.Lives with brother.Retired,previously maintenance work for [employer]   Social Determinants of Health   Financial Resource Strain: Not on file  Food Insecurity: Not on file  Transportation Needs: Not on file  Physical Activity: Not on file  Stress: Not on file  Social Connections: Not on file    Review of Systems: A 12 point ROS discussed and pertinent positives are  indicated in the HPI above.  All other systems are negative.   Vital Signs: BP (!) 160/57 (BP Location: Left Arm)   Pulse (!) 52   Temp (!) 97 F (36.1 C) (Oral)   Resp 15   Ht 5\' 9"  (1.753 m)   Wt 180 lb (81.6 kg)   SpO2 99%   BMI 26.58 kg/m   Physical Exam Vitals reviewed.  HENT:     Mouth/Throat:     Mouth: Mucous membranes are moist.  Cardiovascular:     Rate and Rhythm: Normal rate and regular rhythm.     Heart sounds: Normal heart sounds.  Pulmonary:     Effort: Pulmonary effort is normal.     Breath sounds: Normal breath sounds.  Abdominal:     Palpations: Abdomen is soft.  Musculoskeletal:        General: Normal range of motion.  Skin:  General: Skin is warm.  Neurological:     Mental Status: He is alert. Mental status is at baseline.  Psychiatric:     Comments: Spoke to son Hazle Nordmann via phone He has consented to procedure     Imaging: CT Head Wo Contrast  Result Date: 06/26/2020 CLINICAL DATA:  Initial evaluation for acute neuro deficit, stroke suspected. EXAM: CT HEAD WITHOUT CONTRAST TECHNIQUE: Contiguous axial images were obtained from the base of the skull through the vertex without intravenous contrast. COMPARISON:  None available. 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 There is an irregular heterogeneous mass positioned at the right occipital cortex measuring 3.5 x 4.3 x 4.2 cm in greatest dimensions, most concerning for a primary CNS neoplasm. Associated hyperdensity suggests a degree of associated hemorrhage. Associated vasogenic edema throughout the posterior right parieto-occipital region with partial effacement of the posterior right lateral ventricle. No more than trace 2 mm right-to-left shift. Basilar cisterns remain patent. No other acute intracranial hemorrhage or large vessel territory infarct. No extra-axial fluid collection. Vascular: No hyperdense vessel. Scattered vascular calcifications noted  within the carotid siphons. Skull: Scalp soft tissues and calvarium within normal limits. Sinuses/Orbits: Globes and orbital soft tissues demonstrate no acute finding. Paranasal sinuses are clear. No mastoid effusion. Other: None. IMPRESSION: 1. 3.5 x 4.3 x 4.2 cm irregular heterogeneous mass positioned at the right occipital cortex. Intrinsic hyperdensity suggests associated hemorrhage. Vasogenic edema throughout the posterior right parieto-occipital region with trace 2 mm right-to-left shift. Differential considerations include a primary CNS neoplasm versus solitary intracranial metastasis. 2. 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 Critical Value/emergent results were called by telephone at the time of interpretation on 06/26/2020 at 9:10 pm to provider [facility] , who verbally acknowledged these results. Electronically Signed   By: Jeannine Boga M.D.   On: 06/26/2020 21:13   MR BRAIN WO CONTRAST  Result Date: 06/28/2020 CLINICAL DATA:  80 year old male with neurologic deficit, hemorrhage and edema in the right occipital lobe on noncontrast head CT. And enlarging right upper lobe lung masslike opacity on CT Chest, Abdomen, and Pelvis. Possible hemorrhagic metastasis or primary tumor. EXAM: MRI HEAD WITHOUT CONTRAST TECHNIQUE: Multiplanar, multiecho pulse sequences of the brain and surrounding structures were obtained without intravenous contrast. COMPARISON:  Head CT without contrast 06/26/2020. FINDINGS: The examination had to be discontinued prior to completion due to patient inability to continue, despite premedication. No contrast was administered. Axial DWI/DTI, along with T2, SWI and T1 weighted imaging was obtained. Brain: Hemorrhage related susceptibility artifact throughout the right occipital pole. Areas of T1 and T2 hyperintense blood products demonstrate diffusion restriction, but there is no larger area of restricted diffusion to strongly suggest acute  infarction. Heterogeneous T1 and T2 blood products with decreased susceptibility throughout the right occipital pole tracking to the right occipital horn encompass an area of 56 x 37 x 42 mm (AP by transverse by CC), for an estimated volume of 43 mL. Confluent vasogenic edema tracking superiorly and anteriorly from the hemorrhage with facilitated diffusion and appears stable from the recent CT. Stable regional mass effect. No midline shift at this time. Basilar cisterns remain patent. Small volume of right lateral intraventricular hemorrhage. No ventriculomegaly. Chronic lacunar infarct left thalamus. No definite additional intracranial hemosiderin, suspect artifact rather than superficial siderosis on SWI in the left perirolandic region on series 13, image 86. Cervicomedullary junction and pituitary are within normal limits. No other vasogenic edema is evident. No other suspicious brain lesion is  evident on the provided sequences. Vascular: Major intracranial vascular flow voids are preserved. Skull and upper cervical spine: Negative. Visualized bone marrow signal is within normal limits. Sinuses/Orbits: Stable, negative. Other: Mastoids are clear. IMPRESSION: 1. Truncated exam and motion degraded despite premedication and repeated imaging attempts. No contrast was administered. 2. Apparently solitary acute hemorrhage or hemorrhagic lesion in the right occipital pole encompassing 43 mL, indeterminate for tumor. Abundant regional vasogenic edema and mild mass effect stable from the CT. Trace right lateral IVH with no ventriculomegaly. 3. Chronic lacunar infarct left thalamus. Electronically Signed   By: Genevie Ann M.D.   On: 06/28/2020 05:52   MR BRAIN W WO CONTRAST  Result Date: 06/30/2020 CLINICAL DATA:  Brain/CNS neoplasm, staging. EXAM: MRI HEAD WITHOUT AND WITH CONTRAST TECHNIQUE: Multiplanar, multiecho pulse sequences of the brain and surrounding structures were obtained without and with intravenous contrast.  CONTRAST:  68mL GADAVIST GADOBUTROL 1 MMOL/ML IV SOLN COMPARISON:  MRI of the brain June 28, 2020. FINDINGS: Brain: Redemonstrated large hemorrhagic lesion in the right occipital lobe measuring approximately 6.0 x 1.5 x 3.7 cm, not significantly changed from prior MRI. The lesion shows thin peripheral multilobular contrast enhancement extending from the cortical surface to the right atrium subependymal region with extension of the hemorrhage into the right lateral ventricle. Prominent vasogenic edema surrounding the lesion and extending into the right parietal and temporal lobes with mass effect on the occipital horn and atrium of the right lateral ventricle. No hydrocephalus or significant midline shift. No other enhancing lesion identified. Scattered foci of T2 hyperintensity are seen within the white matter of the cerebral hemispheres, nonspecific, most likely related to chronic small vessel ischemia. Vascular: Normal flow voids. Skull and upper cervical spine: Normal marrow signal. Sinuses/Orbits: Negative. 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 Electronically Signed   By: Pedro Earls M.D.   On: 06/30/2020 14:54   CT CHEST ABDOMEN PELVIS W CONTRAST  Result Date: 06/28/2020 CLINICAL DATA:  Brain lesion, assess for metastatic disease EXAM: CT CHEST, ABDOMEN, AND PELVIS WITH CONTRAST TECHNIQUE: Multidetector CT imaging of the chest, abdomen and pelvis was performed following the standard protocol during bolus administration of intravenous contrast. CONTRAST:  138mL OMNIPAQUE IOHEXOL 300 MG/ML  SOLN COMPARISON:  CT angiography 05/27/2020, CT abdomen and pelvis 04/27/2012 FINDINGS: CT CHEST FINDINGS Cardiovascular: Normal heart size. No pericardial effusion. Coronary artery calcifications are present. The aortic root is suboptimally  assessed given cardiac pulsation artifact. Atherosclerotic plaque within the normal caliber aorta and proximal great vessels. No acute luminal abnormality of the imaged aorta. No periaortic stranding or hemorrhage. Normal 3 vessel branching of the aortic arch. Proximal great vessels are free of acute abnormality. Central pulmonary arteries are normal caliber. Linear hypodense filling defect in the distal left main pulmonary artery at the bifurcation of the lingular and left lower lobar arteries (3/27) several additional smaller possible filling defects are seen pulmonary arteries of the left lower lobe (3/34) and right lower lobe (3/31). Truncation of the right posterior segmental pulmonary artery as it enters the region of the heterogeneous attenuating masslike opacity in the right upper lobe. Additional narrowing of the adjacent pulmonary veins and hilar airways as well. Central pulmonary arteries remain normal caliber. RV/LV ratio is maintained. Mediastinum/Nodes: Mass within the right upper with extension along the right superior mediastinal margin extending into the right hilum and subcarinal space.  There is associated narrowing of the superior hilar airways and vessels. A right superior hilar deposit measures up to 12 mm in size, previously 9 mm (3/23) increasing subcarinal extension as well narrowing the right mainstem bronchus with a subcarinal deposit measures approximately 2.3 cm on today's exam, previously 2.1 cm. No contralateral hilar adenopathy. The right lateral margin of the midthoracic esophagus is difficult to separate from this soft tissue mass. Lungs/Pleura: Large area of masslike opacity in the right upper lobe with extension along the right superior mediastinal margin and into the hilum and subcarinal space. This measures approximately 6.4 x 8 cm, previously 5.2 x 6.3 cm at similar levels. There is decreasing central cavitation now with a slightly more hypoattenuating necrotic appearance on  today's exam. Associated narrowing of the hilar airways and vessels including some occlusion of the upper lobe airways centrally. Surrounding ground-glass and consolidative opacity may reflect some postobstructive pneumonia change. Some additional more patchy ground-glass opacities in the right upper lobe periphery are similar to prior. Stable 4 mm subpleural nodules seen in the lateral and posterior right lower lobe. Remaining portions of the lungs are clear. No pneumothorax or pleural effusion. Minimal atelectasis. Musculoskeletal: No worrisome chest wall mass or lesion. No direct extension into the chest wall or adjacent osseous structures by the upper lobe mass. Multilevel degenerative changes are present in the imaged portions of the spine. Exaggerated thoracic kyphosis with some degenerative vertebral body height loss and multilevel Schmorl's node formations. No acute osseous abnormality or suspicious osseous lesion. CT ABDOMEN PELVIS FINDINGS Hepatobiliary: No worrisome focal liver lesions. Smooth liver surface contour. Normal hepatic attenuation. Normal gallbladder and biliary tree. Pancreas: No pancreatic ductal dilatation or surrounding inflammatory changes. 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 Adrenals/Urinary Tract: Hypoattenuating nodule in the medial limb of the right adrenal gland again measures up to 1 cm in size, unchanged since 2014 and strongly favoring a benign process such as adenoma rather than metastatic lesion. No concerning right adrenal lesion. Stable appearance of the multiple fluid attenuation cyst in a slightly more intermediate attenuation exophytic cyst arising from the lower pole left kidney. Mild symmetric bilateral perinephric stranding, a nonspecific finding which may correlate with advanced age or decreased renal function.  No concerning renal mass, obstructive urolith or hydronephrosis. Multiple coarse calcifications layering dependently within the bladder may reflect bladder calculi though these were not present on comparison imaging. There is however some chronic indentation of the urinary bladder by an enlarged, heterogeneous prostate gland. Stomach/Bowel: Distal esophagus, stomach and duodenal sweep are unremarkable. No small bowel wall thickening or dilatation. No evidence of obstruction. A normal appendix is visualized. No colonic dilatation or wall thickening. Vascular/Lymphatic: Atherosclerotic calcifications within the abdominal aorta and branch vessels. No aneurysm or ectasia. No enlarged abdominopelvic lymph nodes. Reproductive: Heterogeneous, enlarged prostate with few typically benign prostate calcifications. Indentation of the bladder base. Other: No abdominopelvic free fluid or free gas. No bowel containing hernias. Musculoskeletal: Multilevel degenerative changes are present in the imaged portions of the spine. Additional degenerative changes in the hips and pelvis. The osseous structures appear diffusely demineralized which may limit detection of small or nondisplaced fractures. No acute osseous abnormality or suspicious osseous lesion. IMPRESSION: 1. Large area of masslike opacity in the right upper lobe with extension along the right superior mediastinal margin and into the right hilum and subcarinal space. This measures approximately 6.4  x 8 cm, previously 5.2 x 6.3 cm at similar levels. There is decreasing central cavitation now with a slightly more hypoattenuating necrotic appearance on today's exam. Findings are concerning for primary lung malignancy, particularly given the findings in the brain. Necrotic/atypical infection is less favored. Consider further evaluation with tissue sampling potentially via bronchoscopy. 2. Increasing subcarinal and right hilar extension with narrowing of the superior hilar airways  and vessels including some occlusion of the right upper lobe airways. The right lateral margin of the midthoracic esophagus is difficult to separate from this soft tissue mass. 3. Some more peripheral areas of patchy consolidation interstitial opacity throughout the right upper lobe likely reflects a postobstructive process 4. Linear hypodense filling defect in the distal left main pulmonary artery at the bifurcation of the lingular and left lower lobar arteries, several additional smaller possible filling defects are seen pulmonary arteries of the left lower lobe and right lower lobe (3/34) and right lower lobe (3/31). Findings are concerning for pulmonary emboli. No CT evidence of right heart strain within limitations of this exam. 5. Multiple coarse calcifications layering dependently within the bladder may reflect bladder calculi though these were not present on comparison imaging. No obstructive urolithiasis or hydronephrosis. 6. Heterogeneous, enlarged prostate gland with some chronic indentation of the urinary bladder base. Could correlate for features of outlet obstruction with urinalysis were clinically appropriate. Furthermore recommend assessment with prostate exam and PSA as warranted. 7. No other convincing evidence of primary malignancy or metastatic disease within the abdomen or pelvis. Electronically Signed   By: Lovena Le M.D.   On: 06/28/2020 01:40   DG Chest Portable 1 View  Result Date: 06/26/2020 CLINICAL DATA:  Fever, chills, weakness, cough EXAM: PORTABLE CHEST 1 VIEW COMPARISON:  05/27/2020 FINDINGS: Consolidation again seen in the right upper lobe compatible with pneumonia. This has improved since prior study. No confluent opacity on the left. Heart is normal size. Aortic atherosclerosis. No effusions. No acute bony abnormality. IMPRESSION: Continued but improving right upper lobe consolidation/pneumonia. Electronically Signed   By: Rolm Baptise M.D.   On: 06/26/2020 20:27   VAS [name]  LOWER EXTREMITY VENOUS (DVT)  Result Date: 06/28/2020  Lower Venous DVT Study Indications: Pulmonary embolism.  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 Comparison Study: No prior venous studies available. Performing Technologist: Darlin Coco RDMS,RVT  Examination Guidelines: A complete evaluation includes B-mode imaging, spectral Doppler, color Doppler, and power Doppler as needed of all accessible portions of each vessel. Bilateral testing is considered an integral part of a complete examination. Limited examinations for reoccurring indications may be performed as noted. The reflux portion of the exam is performed with the patient in reverse Trendelenburg.  +---------+---------------+---------+-----------+----------+--------------+ RIGHT    CompressibilityPhasicitySpontaneityPropertiesThrombus Aging +---------+---------------+---------+-----------+----------+--------------+ CFV      Full           Yes      Yes                                 +---------+---------------+---------+-----------+----------+--------------+ SFJ      Full                                                        +---------+---------------+---------+-----------+----------+--------------+ FV Prox  Full                                                        +---------+---------------+---------+-----------+----------+--------------+  FV Mid   Full                                                        +---------+---------------+---------+-----------+----------+--------------+ FV DistalFull                                                        +---------+---------------+---------+-----------+----------+--------------+ PFV      Full                                                        +---------+---------------+---------+-----------+----------+--------------+ POP      Full           Yes      Yes                                  +---------+---------------+---------+-----------+----------+--------------+ PTV      Full                                                        +---------+---------------+---------+-----------+----------+--------------+ PERO     Full                                                        +---------+---------------+---------+-----------+----------+--------------+   +---------+---------------+---------+-----------+----------+--------------+ LEFT     CompressibilityPhasicitySpontaneityPropertiesThrombus Aging +---------+---------------+---------+-----------+----------+--------------+ CFV      Full           Yes      Yes                                 +---------+---------------+---------+-----------+----------+--------------+ SFJ      Full                                                        +---------+---------------+---------+-----------+----------+--------------+ FV Prox  Full                                                        +---------+---------------+---------+-----------+----------+--------------+ FV Mid   Full                                                        +---------+---------------+---------+-----------+----------+--------------+  FV DistalFull                                                        +---------+---------------+---------+-----------+----------+--------------+ PFV      Full                                                        +---------+---------------+---------+-----------+----------+--------------+ POP      Full           Yes      Yes                                 +---------+---------------+---------+-----------+----------+--------------+ PTV                                                   BKA            +---------+---------------+---------+-----------+----------+--------------+ PERO                                                  BKA             +---------+---------------+---------+-----------+----------+--------------+     Summary: RIGHT: - There is no evidence of deep vein thrombosis in the lower extremity.  - No cystic structure found in the popliteal fossa.  LEFT: - There is no evidence of deep vein thrombosis in the lower extremity.  - No cystic structure found in the popliteal fossa.  *See table(s) above for measurements and observations. Electronically signed by Deitra Mayo MD on 06/28/2020 at 3:46:57 PM.    Final     Labs:  CBC: Recent Labs    06/28/20 0153 06/28/20 1024 06/28/20 1948 06/29/20 0306 07/02/20 0057 07/05/20 0455  WBC 10.8*  --   --  16.4* 18.5* 19.3*  HGB 7.1*   < > 8.3* 8.6* 9.7* 9.2*  HCT 24.8*   < > 27.8* 28.3* 33.1* 29.7*  PLT 683*  --   --  655* 574* 452*   < > = values in this interval not displayed.    COAGS: Recent Labs    06/26/20 2047  INR 1.1    BMP: Recent Labs    11/30/19 1536 12/08/19 1301 04/14/20 0000 05/27/20 1611 06/27/20 0529 06/28/20 0153 06/29/20 0306 07/05/20 0455  NA 136 137 141   < > 133* 131* 136 136  K 5.3 4.4 4.6   < > 4.4 3.8 4.0 4.7  CL 100 102 107   < > 102 100 104 105  CO2 24 25 28    < > 22 22 27 27   GLUCOSE 105* 261* 179*   < > 166* 288* 234* 239*  BUN 14 19 15    < > 26* 24* 18 25*  CALCIUM 9.6 8.9 9.3   < > 8.4* 8.5* 8.6* 8.4*  CREATININE 0.74 0.93 0.74   < >  0.90 0.85 0.70 0.77  GFRNONAA 89 >60 89   < > >60 >60 >60 >60  GFRAA 103 >60 103  --   --   --   --   --    < > = values in this interval not displayed.    LIVER FUNCTION TESTS: Recent Labs    11/30/19 1536 04/14/20 0000 05/27/20 1611 06/26/20 2047  BILITOT 0.2 0.3 0.7 0.4  AST 10 9* 14* 12*  ALT 6* 7* 9 11  ALKPHOS  --   --  80 74  PROT 7.1 6.8 7.6 7.5  ALBUMIN  --   --  3.5 3.3*    TUMOR MARKERS: No results for input(s): AFPTM, CEA, CA199, CHROMGRNA in the last 8760 hours.  Assessment and Plan:  Hemoptysis ; +PE Symptoms duration of 6 months at least Pt refused  initial admission 1 mo ago Now returns with new occipital lesion and lung mass Scheduled for craniotomy Friday this week Off anticoagulation; needs retrievable IVC filter pre surgery Risks and benefits discussed with the patient's son Hazle Nordmann via phone including, but not limited to bleeding, infection, contrast induced renal failure, filter fracture or migration which can lead to emergency surgery or even death, strut penetration with damage or irritation to adjacent structures and caval thrombosis.  All questions were answered, son is agreeable to proceed. Consent signed and in chart.   Thank you for this interesting consult.  I greatly enjoyed meeting Thomas Garcia and look forward to participating in their care.  A copy of this report was sent to the requesting provider on this date.  Electronically Signed: Lavonia Drafts, PA-C 07/05/2020, 10:00 AM   I spent a total of 20 Minutes    in face to face in clinical consultation, greater than 50% of which was counseling/coordinating care for retrievable IVC filter placement

## 2020-07-05 NOTE — Progress Notes (Signed)
PT Cancellation Note  Patient Details Name: CORRIE BRANNEN MRN: [id number] DOB: 09/01/1940   Cancelled Treatment:    Reason Eval/Treat Not Completed: Fatigue/lethargy limiting ability to participate - pt reports fatigue and "wooziness" after procedure, requests PT check back tomorrow.   Stacie Glaze, PT Acute Rehabilitation Services Pager [phone]  Office [phone]    Louis Matte 07/05/2020, 5:26 PM

## 2020-07-05 NOTE — Progress Notes (Signed)
Thomas Garcia  FBP:[id number] DOB: 1940/10/19 DOA: 06/26/2020 PCP: Doree Albee, MD    Brief Narrative:  80yo with a hx of HTN, DM 2, and PAD who presented to the ER with severe generalized weakness and fatigue, nausea, loss of appetite, and scant hemoptysis. 1 month prior he presented to an ER with shortness of breath and 6 months of hemoptysis and was found to have small bilateral pulmonary emboli as well as a masslike consolidation in the right upper lobe with cavitation.  He refused admission at that time.  Upon his return to the ER for this admission a CT head revealed evidence of a right occipital mass.  3/29 IVC filter placed  Antimicrobials:  Rocephin 3/21 >3/25  DVT prophylaxis: Subcutaneous heparin  Consultants:  Pulmonary Neurosurgery  Subjective: Awaiting craniotomy.  Blood pressure trending upward.  Vitals otherwise stable.  CBG not yet at goal.  Mildly confused today but pleasant and interactive.  Denies new complaints.  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 Was treated with empiric antibiotic for possible postobstructive pneumonia - Pulmonary following peripherally - if develops gross hemoptysis will need IR directed embolization  Right occipital mass -suspected metastatic lung cancer Complicated by mild hemorrhage and vasogenic edema resulting in a 2 mm right-to-left shift - Neurosurgery following - continue decadron and empiric keppra - craniotomy timing as per Neurosurgery with Plavix on hold  Pulmonary embolism Full anticoagulation on hold in setting of hemoptysis and bleeding from brain mass - no evidence of DVT on bilateral lower extremity venous duplex - began SQ heparin at prophy dose 3/25 - monitor closely for bleeding - IR placed a retrievable IVC filter 3/29  Chronic iron deficiency anemia Likely a consequence of his previously undiagnosed cancer - no evidence of gross blood loss at this time   PAD Status post left  BKA - Plavix necessarily on hold due to above - continue statin - followed by Dr. Carlis Abbott  HTN Blood pressure trending upward -adjust medical therapy and follow  Uncontrolled DM 2 w/ hyperglycemia A1c 6.4 - CBG trending upward -continue to titrate insulin therapy and monitor closely  GERD Well compensated  Hyponatremia Corrected  Code Status: FULL CODE Family Communication: No family present at time of exam Status is: Inpatient  Remains inpatient appropriate because:Inpatient level of care appropriate due to severity of illness   Dispo: The patient is from: Home              Anticipated d/c is to: unclear              Patient currently is not medically stable to d/c.   Difficult to place patient No     Objective: Blood pressure (!) 160/57, pulse (!) 52, temperature (!) 97 F (36.1 C), temperature source Oral, resp. rate 15, height 5\' 9"  (1.753 m), weight 81.6 kg, SpO2 99 %.  Intake/Output Summary (Last 24 hours) at 07/05/2020 0950 Last data filed at 07/05/2020 0817 Gross per 24 hour  Intake 240 ml  Output 925 ml  Net -685 ml   Filed Weights   06/26/20 1856  Weight: 81.6 kg    Examination: General: No acute respiratory distress Lungs: Good air movement bilaterally without wheezing Cardiovascular: RRR without murmur Abdomen: NT/ND, soft, BS positive, no rebound Extremities: No edema lower extremity/stump  CBC: Recent Labs  Lab 06/29/20 0306 07/02/20 0057 07/05/20 0455  WBC 16.4* 18.5* 19.3*  HGB 8.6* 9.7* 9.2*  HCT 28.3* 33.1* 29.7*  MCV  75.3* 77.5* 74.8*  PLT 655* 574* 016*   Basic Metabolic Panel: Recent Labs  Lab 06/29/20 0306 07/05/20 0455  NA 136 136  K 4.0 4.7  CL 104 105  CO2 27 27  GLUCOSE 234* 239*  BUN 18 25*  CREATININE 0.70 0.77  CALCIUM 8.6* 8.4*  MG 2.3  --    GFR: Estimated Creatinine Clearance: 77.3 mL/min (by C-G formula based on SCr of 0.77 mg/dL).  Liver Function Tests: No results for input(s): AST, ALT, ALKPHOS, BILITOT,  PROT, ALBUMIN in the last 168 hours.  Coagulation Profile: No results for input(s): INR, PROTIME in the last 168 hours.  HbA1C: Hgb A1c MFr Bld  Date/Time Value Ref Range Status  06/27/2020 05:29 AM 6.4 (H) 4.8 - 5.6 % Final    Comment:    (NOTE) Pre diabetes:          5.7%-6.4%  Diabetes:              >6.4%  Glycemic control for   <7.0% adults with diabetes   04/14/2020 12:00 AM 5.6 <5.7 % of total Hgb Final    Comment:    For the purpose of screening for the presence of diabetes: . <5.7%       Consistent with the absence of diabetes 5.7-6.4%    Consistent with increased risk for diabetes             (prediabetes) > or =6.5%  Consistent with diabetes . This assay result is consistent with a decreased risk of diabetes. . Currently, no consensus exists regarding use of hemoglobin A1c for diagnosis of diabetes in children. . According to American Diabetes Association (ADA) guidelines, hemoglobin A1c <7.0% represents optimal control in non-pregnant diabetic patients. Different metrics may apply to specific patient populations.  Standards of Medical Care in Diabetes(ADA). .     CBG: Recent Labs  Lab 07/04/20 0839 07/04/20 1215 07/04/20 1600 07/04/20 2114 07/05/20 0810  GLUCAP 263* 313* 268* 279* 266*    Recent Results (from the past 240 hour(s))  Resp Panel by RT-PCR (Flu A&B, Covid) Nasopharyngeal Swab     Status: None   Collection Time: 06/26/20 11:18 PM   Specimen: Nasopharyngeal Swab; Nasopharyngeal(NP) swabs in vial transport medium  Result Value Ref Range Status   SARS Coronavirus 2 by RT PCR NEGATIVE NEGATIVE Final    Comment: (NOTE) SARS-CoV-2 target nucleic acids are NOT DETECTED.  The SARS-CoV-2 RNA is generally detectable in upper respiratory specimens during the acute phase of infection. The lowest concentration of SARS-CoV-2 viral copies this assay can detect is 138 copies/mL. A negative result does not preclude SARS-Cov-2 infection and  should not be used as the sole basis for treatment or other patient management decisions. A negative result may occur with  improper specimen collection/handling, submission of specimen other than nasopharyngeal swab, presence of viral mutation(s) within the areas targeted by this assay, and inadequate number of viral copies(<138 copies/mL). A negative result must be combined with clinical observations, patient history, and epidemiological information. The expected result is Negative.  Fact Sheet for Patients:  EntrepreneurPulse.com.au  Fact Sheet for Healthcare Providers:  IncredibleEmployment.be  This test is no t yet approved or cleared by the [place] FDA and  has been authorized for detection and/or diagnosis of SARS-CoV-2 by FDA under an Emergency Use Authorization (EUA). This EUA will remain  in effect (meaning this test can be used) for the duration of the COVID-19 declaration under Section 564(b)(1) of the Act, 21 U.S.C.section  360bbb-3(b)(1), unless the authorization is terminated  or revoked sooner.       Influenza A by PCR NEGATIVE NEGATIVE Final   Influenza B by PCR NEGATIVE NEGATIVE Final    Comment: (NOTE) The Xpert Xpress SARS-CoV-2/FLU/RSV plus assay is intended as an aid in the diagnosis of influenza from Nasopharyngeal swab specimens and should not be used as a sole basis for treatment. Nasal washings and aspirates are unacceptable for Xpert Xpress SARS-CoV-2/FLU/RSV testing.  Fact Sheet for Patients: EntrepreneurPulse.com.au  Fact Sheet for Healthcare Providers: IncredibleEmployment.be  This test is not yet approved or cleared by the [place] FDA and has been authorized for detection and/or diagnosis of SARS-CoV-2 by FDA under an Emergency Use Authorization (EUA). This EUA will remain in effect (meaning this test can be used) for the duration of the COVID-19 declaration  under Section 564(b)(1) of the Act, 21 U.S.C. section 360bbb-3(b)(1), unless the authorization is terminated or revoked.  Performed at [facility], [address], [place], [place] [address]   MRSA PCR Screening     Status: None   Collection Time: 06/27/20  3:58 PM   Specimen: Nasopharyngeal  Result Value Ref Range Status   MRSA by PCR NEGATIVE NEGATIVE Final    Comment:        The GeneXpert MRSA Assay (FDA approved for NASAL specimens only), is one component of a comprehensive MRSA colonization surveillance program. It is not intended to diagnose MRSA infection nor to guide or monitor treatment for MRSA infections. Performed at [facility], [place] [address], [place], [place] [address]      Scheduled Meds: . atorvastatin  10 mg Oral q1800  . dexamethasone  4 mg Oral Q6H  . heparin injection (subcutaneous)  5,000 Units Subcutaneous Q8H  . insulin aspart  0-20 Units Subcutaneous TID WC  . insulin aspart  0-5 Units Subcutaneous QHS  . insulin glargine  16 Units Subcutaneous BID  . levETIRAcetam  500 mg Oral BID  . pantoprazole  40 mg Oral Q0600  . traZODone  50 mg Oral QHS      LOS: 9 days   Cherene Altes, MD [facility] Office  [phone] Pager - Text Page per Amion  If 7PM-7AM, please contact night-coverage per Amion 07/05/2020, 9:50 AM

## 2020-07-05 NOTE — Procedures (Signed)
Interventional Radiology Procedure Note  Procedure: IVC filter placement  Findings: Please refer to procedural dictation for full description. Infrarenal [place] IVC filter placed.  Complications: None immediate  Estimated Blood Loss: < 5 mL  Recommendations: IR will arrange for 3 month outpatient follow up for consideration of IVC filter retrieval.   Ruthann Cancer, MD Pager: [phone]

## 2020-07-06 ENCOUNTER — Other Ambulatory Visit: Payer: Self-pay | Admitting: Radiation Therapy

## 2020-07-06 DIAGNOSIS — E119 Type 2 diabetes mellitus without complications: Secondary | ICD-10-CM | POA: Diagnosis not present

## 2020-07-06 DIAGNOSIS — J984 Other disorders of lung: Secondary | ICD-10-CM | POA: Diagnosis not present

## 2020-07-06 DIAGNOSIS — G9389 Other specified disorders of brain: Secondary | ICD-10-CM | POA: Diagnosis not present

## 2020-07-06 DIAGNOSIS — I10 Essential (primary) hypertension: Secondary | ICD-10-CM | POA: Diagnosis not present

## 2020-07-06 LAB — GLUCOSE, CAPILLARY
Glucose-Capillary: 201 mg/dL — ABNORMAL HIGH (ref 70–99)
Glucose-Capillary: 280 mg/dL — ABNORMAL HIGH (ref 70–99)
Glucose-Capillary: 271 mg/dL — ABNORMAL HIGH (ref 70–99)
Glucose-Capillary: 296 mg/dL — ABNORMAL HIGH (ref 70–99)
Glucose-Capillary: 258 mg/dL — ABNORMAL HIGH (ref 70–99)

## 2020-07-06 LAB — CBC
HCT: 32.3 % — ABNORMAL LOW (ref 39.0–52.0)
Hemoglobin: 9.6 g/dL — ABNORMAL LOW (ref 13.0–17.0)
MCH: 22.6 pg — ABNORMAL LOW (ref 26.0–34.0)
MCHC: 29.7 g/dL — ABNORMAL LOW (ref 30.0–36.0)
MCV: 76 fL — ABNORMAL LOW (ref 80.0–100.0)
Platelets: 453 10*3/uL — ABNORMAL HIGH (ref 150–400)
RBC: 4.25 MIL/uL (ref 4.22–5.81)
RDW: 21.7 % — ABNORMAL HIGH (ref 11.5–15.5)
WBC: 20.2 10*3/uL — ABNORMAL HIGH (ref 4.0–10.5)
nRBC: 0.7 % — ABNORMAL HIGH (ref 0.0–0.2)

## 2020-07-06 MED ORDER — INSULIN ASPART 100 UNIT/ML ~~LOC~~ SOLN
5.0000 [IU] | Freq: Three times a day (TID) | SUBCUTANEOUS | Status: DC
  Administered 2020-07-07: 5 [IU] via SUBCUTANEOUS

## 2020-07-06 MED ORDER — INSULIN GLARGINE 100 UNIT/ML ~~LOC~~ SOLN
25.0000 [IU] | Freq: Two times a day (BID) | SUBCUTANEOUS | Status: DC
  Administered 2020-07-06 – 2020-07-07 (×2): 25 [IU] via SUBCUTANEOUS
  Filled 2020-07-06 (×4): qty 0.25

## 2020-07-06 NOTE — Progress Notes (Signed)
Physical Therapy Treatment Patient Details Name: Thomas Garcia MRN: [id number] DOB: 1941-01-07 Today's Date: 07/06/2020    History of Present Illness 80 yo male presenting 3/20 with c/o general weakness, fatigue, nausea, and loss of appetite. Upon work-up, pt found to have right occipital cortex with hemorrhage with 61mm midline shift. MRI on 3/23 showed "large multilobulated hemorrhagic lesion in right occipital lobe". PMH includes: lung cancer, HTN, DM II, PAD, L BKA.    PT Comments    Pt reporting feeling unwell and thus did not desire to complete exercises with PT today but was agreeable to showing PT how he transfers between surfaces at home. Pt wanting suggestions on ways to improve transfers, thus focused on that this session. Pt with poor buttocks clearance, tending to sit half-way on each surface and needing multiple scoots to complete transfers. Pt educated to drop arm of chair if possible, bring surfaces proximal to each other, have someone secure surfaces to avoid slipping, and use rails to pull himself towards target surface. He confirmed that he does this technique at home. Will continue to follow acutely. Current recommendations remain appropriate.    Follow Up Recommendations  No PT follow up     Equipment Recommendations  None recommended by PT (pt well equipped)    Recommendations for Other Services       Precautions / Restrictions Precautions Precautions: Fall Precaution Comments: L BKA at baseline Restrictions Weight Bearing Restrictions: No    Mobility  Bed Mobility Overal bed mobility: Independent Bed Mobility: Supine to Sit     Supine to sit: HOB elevated;Modified independent (Device/Increase time)     General bed mobility comments: Pt able to come to long-sitting and transition to sit EOB safely.    Transfers Overall transfer level: Needs assistance Equipment used: None Transfers: Lateral/Scoot Transfers          Lateral/Scoot Transfers: Min  guard General transfer comment: Lateral scoot transfers to R 1x and to L 1x bed <> recliner with proximal arm rest of recliner dropped and pt either pulling on bed rails or distal chair armrest to scoot laterally between surfaces. Pt needing several scoot reps to complete full transfer. Pt cued to pivot to an angle prior to clearing surfaces and to get majority of buttocks on target surface for safety purposes if unable to fully clear buttocks.  Ambulation/Gait             General Gait Details: deferred, pt reports WC level at baseline   Stairs             Wheelchair Mobility    Modified Rankin (Stroke Patients Only)       Balance Overall balance assessment: Mild deficits observed, not formally tested                                          Cognition Arousal/Alertness: Awake/alert Behavior During Therapy: WFL for tasks assessed/performed;Anxious Overall Cognitive Status: No family/caregiver present to determine baseline cognitive functioning Area of Impairment: Attention;Safety/judgement;Awareness;Problem solving;Following commands                   Current Attention Level: Sustained   Following Commands: Follows one step commands with increased time;Follows multi-step commands with increased time Safety/Judgement: Decreased awareness of safety;Decreased awareness of deficits Awareness: Emergent Problem Solving: Slow processing;Requires verbal cues General Comments: Pt needing redirection at times and increased time to  process and respond to cues. Pt reminders on safety transferring between 2 surfaces.      Exercises      General Comments General comments (skin integrity, edema, etc.): Pt declined lower extremity exercises and reports he will do them later today.      Pertinent Vitals/Pain Pain Assessment: Faces Faces Pain Scale: Hurts a little bit Pain Location: back Pain Descriptors / Indicators: Discomfort;Guarding Pain  Intervention(s): Monitored during session;Limited activity within patient's tolerance;Repositioned    Home Living                      Prior Function            PT Goals (current goals can now be found in the care plan section) Acute Rehab PT Goals Patient Stated Goal: to get back to his routine at home PT Goal Formulation: With patient Time For Goal Achievement: 07/15/20 Potential to Achieve Goals: Good Progress towards PT goals: Progressing toward goals    Frequency    Min 3X/week      PT Plan Current plan remains appropriate    Co-evaluation              AM-PAC PT "6 Clicks" Mobility   Outcome Measure  Help needed turning from your back to your side while in a flat bed without using bedrails?: None Help needed moving from lying on your back to sitting on the side of a flat bed without using bedrails?: None Help needed moving to and from a bed to a chair (including a wheelchair)?: A Little Help needed standing up from a chair using your arms (e.g., wheelchair or bedside chair)?: A Little Help needed to walk in hospital room?: Total Help needed climbing 3-5 steps with a railing? : Total 6 Click Score: 16    End of Session   Activity Tolerance: Patient tolerated treatment well Patient left: with call bell/phone within reach;with bed alarm set;in bed Nurse Communication: Mobility status PT Visit Diagnosis: Other abnormalities of gait and mobility (R26.89)     Time: 4287-6811 PT Time Calculation (min) (ACUTE ONLY): 35 min  Charges:  $Therapeutic Activity: 23-37 mins                     Moishe Spice, PT, DPT Acute Rehabilitation Services  Pager: [phone] Office: [place] 07/06/2020, 4:37 PM

## 2020-07-06 NOTE — Progress Notes (Signed)
Thomas Garcia  YBO:[id number] DOB: 11/06/1940 DOA: 06/26/2020 PCP: Doree Albee, MD    Brief Narrative:  80yo with a hx of HTN, DM 2, and PAD who presented to the ER with severe generalized weakness and fatigue, nausea, loss of appetite, and scant hemoptysis. 1 month prior he presented to an ER with shortness of breath and 6 months of hemoptysis and was found to have small bilateral pulmonary emboli as well as a masslike consolidation in the right upper lobe with cavitation.  He refused admission at that time.  Upon his return to the ER for this admission a CT head revealed evidence of a right occipital mass.  3/29 IVC filter placed  Antimicrobials:  Rocephin 3/21 >3/25  DVT prophylaxis: Subcutaneous heparin  Consultants:  Pulmonary Neurosurgery  Subjective: No shortness of breath or hemoptysis. 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 Was treated with empiric antibiotic for possible postobstructive pneumonia - Pulmonary following peripherally - if develops gross hemoptysis will need IR directed embolization  Right occipital mass -suspected metastatic lung cancer Complicated by mild hemorrhage and vasogenic edema resulting in a 2 mm right-to-left shift - Neurosurgery following - continue decadron and empiric keppra - craniotomy timing as per Neurosurgery with Plavix on hold  Pulmonary embolism Full anticoagulation on hold in setting of hemoptysis and bleeding from brain mass - no evidence of DVT on bilateral lower extremity venous duplex - began SQ heparin at prophy dose 3/25 - monitor closely for bleeding - IR placed a retrievable IVC filter 3/29  Chronic iron deficiency anemia Likely a consequence of his previously undiagnosed cancer - no evidence of gross blood loss at this time   PAD Status post left BKA - Plavix necessarily on hold due to above - continue statin - followed by Dr. Carlis Abbott  HTN Blood pressure trending  upward -adjust medical therapy and follow  Uncontrolled DM 2 w/ hyperglycemia A1c 6.4 - CBG continues to trend up -continue to titrate insulin therapy and monitor closely  GERD Well compensated  Hyponatremia Corrected  Code Status: FULL CODE Family Communication: No family present at time of exam Status is: Inpatient  Remains inpatient appropriate because:Inpatient level of care appropriate due to severity of illness   Dispo: The patient is from: Home              Anticipated d/c is to: unclear              Patient currently is not medically stable to d/c.   Difficult to place patient No     Objective: Blood pressure (!) 94/57, pulse 88, temperature 98.2 F (36.8 C), temperature source Oral, resp. rate 16, height 5\' 9"  (1.753 m), weight 81.6 kg, SpO2 100 %.  Intake/Output Summary (Last 24 hours) at 07/06/2020 1858 Last data filed at 07/06/2020 1200 Gross per 24 hour  Intake --  Output 700 ml  Net -700 ml   Filed Weights   06/26/20 1856  Weight: 81.6 kg    Examination: General exam: Alert, awake, oriented x 3 Respiratory system: Clear to auscultation. Respiratory effort normal. Cardiovascular system:RRR. No murmurs, rubs, gallops. Gastrointestinal system: Abdomen is nondistended, soft and nontender. No organomegaly or masses felt. Normal bowel sounds heard. Central nervous system: Alert and oriented. No focal neurological deficits. Skin: No rashes, lesions or ulcers Psychiatry: Judgement and insight appear normal. Mood & affect appropriate.    CBC: Recent Labs  Lab 07/02/20 0057 07/05/20 0455 07/06/20 2778  WBC 18.5* 19.3* 20.2*  HGB 9.7* 9.2* 9.6*  HCT 33.1* 29.7* 32.3*  MCV 77.5* 74.8* 76.0*  PLT 574* 452* 016*   Basic Metabolic Panel: Recent Labs  Lab 07/05/20 0455  NA 136  K 4.7  CL 105  CO2 27  GLUCOSE 239*  BUN 25*  CREATININE 0.77  CALCIUM 8.4*   GFR: Estimated Creatinine Clearance: 77.3 mL/min (by C-G formula based on SCr of 0.77  mg/dL).  Liver Function Tests: No results for input(s): AST, ALT, ALKPHOS, BILITOT, PROT, ALBUMIN in the last 168 hours.  Coagulation Profile: No results for input(s): INR, PROTIME in the last 168 hours.  HbA1C: Hgb A1c MFr Bld  Date/Time Value Ref Range Status  06/27/2020 05:29 AM 6.4 (H) 4.8 - 5.6 % Final    Comment:    (NOTE) Pre diabetes:          5.7%-6.4%  Diabetes:              >6.4%  Glycemic control for   <7.0% adults with diabetes   04/14/2020 12:00 AM 5.6 <5.7 % of total Hgb Final    Comment:    For the purpose of screening for the presence of diabetes: . <5.7%       Consistent with the absence of diabetes 5.7-6.4%    Consistent with increased risk for diabetes             (prediabetes) > or =6.5%  Consistent with diabetes . This assay result is consistent with a decreased risk of diabetes. . Currently, no consensus exists regarding use of hemoglobin A1c for diagnosis of diabetes in children. . According to American Diabetes Association (ADA) guidelines, hemoglobin A1c <7.0% represents optimal control in non-pregnant diabetic patients. Different metrics may apply to specific patient populations.  Standards of Medical Care in Diabetes(ADA). .     CBG: Recent Labs  Lab 07/05/20 2232 07/06/20 0824 07/06/20 1233 07/06/20 1713 07/06/20 1851  GLUCAP 244* 201* 280* 271* 296*    Recent Results (from the past 240 hour(s))  Resp Panel by RT-PCR (Flu A&B, Covid) Nasopharyngeal Swab     Status: None   Collection Time: 06/26/20 11:18 PM   Specimen: Nasopharyngeal Swab; Nasopharyngeal(NP) swabs in vial transport medium  Result Value Ref Range Status   SARS Coronavirus 2 by RT PCR NEGATIVE NEGATIVE Final    Comment: (NOTE) SARS-CoV-2 target nucleic acids are NOT DETECTED.  The SARS-CoV-2 RNA is generally detectable in upper respiratory specimens during the acute phase of infection. The lowest concentration of SARS-CoV-2 viral copies this assay can  detect is 138 copies/mL. A negative result does not preclude SARS-Cov-2 infection and should not be used as the sole basis for treatment or other patient management decisions. A negative result may occur with  improper specimen collection/handling, submission of specimen other than nasopharyngeal swab, presence of viral mutation(s) within the areas targeted by this assay, and inadequate number of viral copies(<138 copies/mL). A negative result must be combined with clinical observations, patient history, and epidemiological information. The expected result is Negative.  Fact Sheet for Patients:  EntrepreneurPulse.com.au  Fact Sheet for Healthcare Providers:  IncredibleEmployment.be  This test is no t yet approved or cleared by the [place] FDA and  has been authorized for detection and/or diagnosis of SARS-CoV-2 by FDA under an Emergency Use Authorization (EUA). This EUA will remain  in effect (meaning this test can be used) for the duration of the COVID-19 declaration under Section 564(b)(1) of the Act, 21 U.S.C.section  360bbb-3(b)(1), unless the authorization is terminated  or revoked sooner.       Influenza A by PCR NEGATIVE NEGATIVE Final   Influenza B by PCR NEGATIVE NEGATIVE Final    Comment: (NOTE) The Xpert Xpress SARS-CoV-2/FLU/RSV plus assay is intended as an aid in the diagnosis of influenza from Nasopharyngeal swab specimens and should not be used as a sole basis for treatment. Nasal washings and aspirates are unacceptable for Xpert Xpress SARS-CoV-2/FLU/RSV testing.  Fact Sheet for Patients: EntrepreneurPulse.com.au  Fact Sheet for Healthcare Providers: IncredibleEmployment.be  This test is not yet approved or cleared by the [place] FDA and has been authorized for detection and/or diagnosis of SARS-CoV-2 by FDA under an Emergency Use Authorization (EUA). This EUA will remain in  effect (meaning this test can be used) for the duration of the COVID-19 declaration under Section 564(b)(1) of the Act, 21 U.S.C. section 360bbb-3(b)(1), unless the authorization is terminated or revoked.  Performed at [facility], [address] [place], [place] [address]   MRSA PCR Screening     Status: None   Collection Time: 06/27/20  3:58 PM   Specimen: Nasopharyngeal  Result Value Ref Range Status   MRSA by PCR NEGATIVE NEGATIVE Final    Comment:        The GeneXpert MRSA Assay (FDA approved for NASAL specimens only), is one component of a comprehensive MRSA colonization surveillance program. It is not intended to diagnose MRSA infection nor to guide or monitor treatment for MRSA infections. Performed at [facility], [place] [address], [place], [place] [address]      Scheduled Meds: . atorvastatin  10 mg Oral q1800  . dexamethasone  4 mg Oral Q6H  . heparin injection (subcutaneous)  5,000 Units Subcutaneous Q8H  . insulin aspart  0-20 Units Subcutaneous TID WC  . insulin aspart  0-5 Units Subcutaneous QHS  . insulin aspart  2 Units Subcutaneous TID WC  . insulin glargine  20 Units Subcutaneous BID  . levETIRAcetam  500 mg Oral BID  . lisinopril  5 mg Oral Daily  . pantoprazole  40 mg Oral Q0600  . traZODone  50 mg Oral QHS      LOS: 10 days   Kathie Dike, MD [facility] Office  [phone] Pager - Text Page per Shea Evans  If 7PM-7AM, please contact night-coverage per Amion 07/06/2020, 6:58 PM

## 2020-07-07 DIAGNOSIS — G9389 Other specified disorders of brain: Secondary | ICD-10-CM | POA: Diagnosis not present

## 2020-07-07 DIAGNOSIS — J984 Other disorders of lung: Secondary | ICD-10-CM | POA: Diagnosis not present

## 2020-07-07 DIAGNOSIS — I10 Essential (primary) hypertension: Secondary | ICD-10-CM | POA: Diagnosis not present

## 2020-07-07 DIAGNOSIS — E119 Type 2 diabetes mellitus without complications: Secondary | ICD-10-CM | POA: Diagnosis not present

## 2020-07-07 LAB — GLUCOSE, CAPILLARY
Glucose-Capillary: 354 mg/dL — ABNORMAL HIGH (ref 70–99)
Glucose-Capillary: 300 mg/dL — ABNORMAL HIGH (ref 70–99)
Glucose-Capillary: 178 mg/dL — ABNORMAL HIGH (ref 70–99)
Glucose-Capillary: 225 mg/dL — ABNORMAL HIGH (ref 70–99)
Glucose-Capillary: 249 mg/dL — ABNORMAL HIGH (ref 70–99)

## 2020-07-07 LAB — BASIC METABOLIC PANEL
Anion gap: 5 (ref 5–15)
BUN: 29 mg/dL — ABNORMAL HIGH (ref 8–23)
CO2: 27 mmol/L (ref 22–32)
Calcium: 8.5 mg/dL — ABNORMAL LOW (ref 8.9–10.3)
Chloride: 105 mmol/L (ref 98–111)
Creatinine, Ser: 0.86 mg/dL (ref 0.61–1.24)
GFR, Estimated: >60 mL/min (ref >60)
Glucose, Bld: 302 mg/dL — ABNORMAL HIGH (ref 70–99)
Potassium: 4.5 mmol/L (ref 3.5–5.1)
Sodium: 137 mmol/L (ref 135–145)

## 2020-07-07 LAB — PREPARE RBC (CROSSMATCH)

## 2020-07-07 LAB — PROTIME-INR
INR: 1 (ref 0.8–1.2)
Prothrombin Time: 13.2 seconds (ref 11.4–15.2)

## 2020-07-07 LAB — APTT: aPTT: 26 seconds (ref 24–36)

## 2020-07-07 MED ORDER — LORAZEPAM 2 MG/ML IJ SOLN
0.5000 mg | Freq: Four times a day (QID) | INTRAMUSCULAR | Status: DC | PRN
  Administered 2020-07-07 – 2020-07-11 (×2): 0.5 mg via INTRAVENOUS
  Filled 2020-07-07 (×2): qty 1

## 2020-07-07 MED ORDER — INSULIN ASPART 100 UNIT/ML ~~LOC~~ SOLN
10.0000 [IU] | Freq: Three times a day (TID) | SUBCUTANEOUS | Status: DC
  Administered 2020-07-07 – 2020-07-10 (×6): 10 [IU] via SUBCUTANEOUS

## 2020-07-07 MED ORDER — INSULIN GLARGINE 100 UNIT/ML ~~LOC~~ SOLN
30.0000 [IU] | Freq: Two times a day (BID) | SUBCUTANEOUS | Status: DC
  Administered 2020-07-07 – 2020-07-10 (×6): 30 [IU] via SUBCUTANEOUS
  Filled 2020-07-07 (×11): qty 0.3

## 2020-07-07 MED ORDER — CEFAZOLIN SODIUM-DEXTROSE 2-4 GM/100ML-% IV SOLN
2.0000 g | INTRAVENOUS | Status: AC
  Administered 2020-07-08: 2 g via INTRAVENOUS
  Filled 2020-07-07: qty 100

## 2020-07-07 NOTE — Progress Notes (Signed)
   Providing Compassionate, Quality Care - Together  NEUROSURGERY PROGRESS NOTE   S: No issues overnight.   O: EXAM:  BP (!) 143/70 (BP Location: Left Arm)   Pulse 88   Temp 98.2 F (36.8 C)   Resp 18   Ht 5\' 9"  (1.753 m)   Wt 81.6 kg   SpO2 99%   BMI 26.58 kg/m   Awake alert oriented x3 Poor dentition No acute distress Face symmetric PERRLA EOMI Left-sided neglect Left hemianopsia No drift Bilateral upper extremities 4+/5 Bilateral lower extremities 4+5 (except forleft BKA) Sensory intact light touch   ASSESSMENT: 80 y.o.malewith   1. Right occipital tumor  PLAN: -OR tomorrow am  -Decadron -keppra -pain control -HOLD PLAVIX -CT CAP - likely lung mets -Okay for DVT prophylaxis with subQheparin, DC tonight -d/w son and brother at bedside and via phone about risks and benefits of surgery, questions answered, they agree to proceed   Thank you for allowing me to participate in this patient's care.  Please do not hesitate to call with questions or concerns.   Elwin Sleight, [facility] Cell: [phone]

## 2020-07-07 NOTE — Progress Notes (Signed)
Thomas Garcia  VQQ:[id number] DOB: [birth year] DOA: 06/26/2020 PCP: Doree Albee, MD    Brief Narrative:  80yo with a hx of HTN, DM 2, and PAD who presented to the ER with severe generalized weakness and fatigue, nausea, loss of appetite, and scant hemoptysis. 1 month prior he presented to an ER with shortness of breath and 6 months of hemoptysis and was found to have small bilateral pulmonary emboli as well as a masslike consolidation in the right upper lobe with cavitation.  He refused admission at that time.  Upon his return to the ER for this admission a CT head revealed evidence of a right occipital mass.  3/29 IVC filter placed  Antimicrobials:  Rocephin 3/21 >3/25  DVT prophylaxis: Subcutaneous heparin  Consultants:  Pulmonary Neurosurgery  Subjective: He feels agitated today. Admits to feeling anxious. Wants to go home. Agreeable to take medications for anxiety which should help him relax. No shortness of breath.  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 Was treated with empiric antibiotic for possible postobstructive pneumonia - Pulmonary following peripherally - if develops gross hemoptysis will need IR directed embolization  Right occipital mass -suspected metastatic lung cancer Complicated by mild hemorrhage and vasogenic edema resulting in a 2 mm right-to-left shift - Neurosurgery following - continue decadron and empiric keppra - craniotomy timing as per Neurosurgery with Plavix on hold  Pulmonary embolism Full anticoagulation on hold in setting of hemoptysis and bleeding from brain mass - no evidence of DVT on bilateral lower extremity venous duplex - began SQ heparin at prophy dose 3/25 - monitor closely for bleeding - IR placed a retrievable IVC filter 3/29  Chronic iron deficiency anemia Likely a consequence of his previously undiagnosed cancer - no evidence of gross blood loss at this time   PAD Status post left BKA - Plavix  necessarily on hold due to above - continue statin - followed by Dr. Carlis Abbott  HTN Blood pressure trending upward -adjust medical therapy and follow  Uncontrolled DM 2 w/ hyperglycemia A1c 6.4 - CBG continues to trend up -further adjustments made to Lantus and novolog  GERD Well compensated  Hyponatremia Corrected  Code Status: FULL CODE Family Communication: No family present at time of exam Status is: Inpatient  Remains inpatient appropriate because:Inpatient level of care appropriate due to severity of illness   Dispo: The patient is from: Home              Anticipated d/c is to: unclear              Patient currently is not medically stable to d/c.   Difficult to place patient No     Objective: Blood pressure 135/66, pulse 63, temperature 98 F (36.7 C), resp. rate 18, height 5\' 9"  (1.753 m), weight 81.6 kg, SpO2 99 %.  Intake/Output Summary (Last 24 hours) at 07/07/2020 1036 Last data filed at 07/06/2020 2132 Gross per 24 hour  Intake --  Output 650 ml  Net -650 ml   Filed Weights   06/26/20 1856  Weight: 81.6 kg    Examination: General exam: Alert, awake, oriented x 3 Respiratory system: Clear to auscultation. Respiratory effort normal. Cardiovascular system:RRR. No murmurs, rubs, gallops. Gastrointestinal system: Abdomen is nondistended, soft and nontender. No organomegaly or masses felt. Normal bowel sounds heard. Central nervous system: Alert and oriented. No focal neurological deficits. Extremities: left BKA, no edema Skin: No rashes, lesions or ulcers Psychiatry: Judgement and insight appear normal.  Mood & affect appropriate.     CBC: Recent Labs  Lab 07/02/20 0057 07/05/20 0455 07/06/20 0811  WBC 18.5* 19.3* 20.2*  HGB 9.7* 9.2* 9.6*  HCT 33.1* 29.7* 32.3*  MCV 77.5* 74.8* 76.0*  PLT 574* 452* 366*   Basic Metabolic Panel: Recent Labs  Lab 07/05/20 0455  NA 136  K 4.7  CL 105  CO2 27  GLUCOSE 239*  BUN 25*  CREATININE 0.77   CALCIUM 8.4*   GFR: Estimated Creatinine Clearance: 77.3 mL/min (by C-G formula based on SCr of 0.77 mg/dL).  Liver Function Tests: No results for input(s): AST, ALT, ALKPHOS, BILITOT, PROT, ALBUMIN in the last 168 hours.  Coagulation Profile: No results for input(s): INR, PROTIME in the last 168 hours.  HbA1C: Hgb A1c MFr Bld  Date/Time Value Ref Range Status  06/27/2020 05:29 AM 6.4 (H) 4.8 - 5.6 % Final    Comment:    (NOTE) Pre diabetes:          5.7%-6.4%  Diabetes:              >6.4%  Glycemic control for   <7.0% adults with diabetes   04/14/2020 12:00 AM 5.6 <5.7 % of total Hgb Final    Comment:    For the purpose of screening for the presence of diabetes: . <5.7%       Consistent with the absence of diabetes 5.7-6.4%    Consistent with increased risk for diabetes             (prediabetes) > or =6.5%  Consistent with diabetes . This assay result is consistent with a decreased risk of diabetes. . Currently, no consensus exists regarding use of hemoglobin A1c for diagnosis of diabetes in children. . According to American Diabetes Association (ADA) guidelines, hemoglobin A1c <7.0% represents optimal control in non-pregnant diabetic patients. Different metrics may apply to specific patient populations.  Standards of Medical Care in Diabetes(ADA). .     CBG: Recent Labs  Lab 07/06/20 1233 07/06/20 1713 07/06/20 1851 07/06/20 2211 07/07/20 0849  GLUCAP 280* 271* 296* 258* 354*    Recent Results (from the past 240 hour(s))  MRSA PCR Screening     Status: None   Collection Time: 06/27/20  3:58 PM   Specimen: Nasopharyngeal  Result Value Ref Range Status   MRSA by PCR NEGATIVE NEGATIVE Final    Comment:        The GeneXpert MRSA Assay (FDA approved for NASAL specimens only), is one component of a comprehensive MRSA colonization surveillance program. It is not intended to diagnose MRSA infection nor to guide or monitor treatment for MRSA  infections. Performed at [facility], [place] [address], [place], [place] [address]      Scheduled Meds: . atorvastatin  10 mg Oral q1800  . dexamethasone  4 mg Oral Q6H  . heparin injection (subcutaneous)  5,000 Units Subcutaneous Q8H  . insulin aspart  0-20 Units Subcutaneous TID WC  . insulin aspart  0-5 Units Subcutaneous QHS  . insulin aspart  10 Units Subcutaneous TID WC  . insulin glargine  30 Units Subcutaneous BID  . levETIRAcetam  500 mg Oral BID  . lisinopril  5 mg Oral Daily  . pantoprazole  40 mg Oral Q0600  . traZODone  50 mg Oral QHS      LOS: 11 days   Kathie Dike, MD [facility] Office  [phone] Pager - Text Page per Amion  If 7PM-7AM, please contact night-coverage  per Amion 07/07/2020, 10:36 AM

## 2020-07-07 NOTE — Anesthesia Preprocedure Evaluation (Addendum)
Anesthesia Evaluation  Patient identified by MRN, date of birth, ID band Patient awake    Reviewed: Allergy & Precautions, NPO status , Patient's Chart, lab work & pertinent test results  History of Anesthesia Complications (+) PONV and history of anesthetic complications  Airway Mallampati: II  TM Distance: >3 FB Neck ROM: Full    Dental  (+) Dental Advisory Given, Missing, Edentulous Upper   Pulmonary Patient abstained from smoking., former smoker,  CT CAP - likely lung mets   Pulmonary exam normal breath sounds clear to auscultation       Cardiovascular hypertension, Pt. on medications + Peripheral Vascular Disease (s/p 3/29 IVC filter)  Normal cardiovascular exam Rhythm:Regular Rate:Normal  Echo 09/2018: 1. The left ventricle has normal systolic function with an ejection  fraction of 60-65%. The cavity size was normal. There is mild concentric  left ventricular hypertrophy. Left ventricular diastolic Doppler  parameters are consistent with impaired  relaxation. Indeterminate filling pressures No evidence of left  ventricular regional wall motion abnormalities.  2. The right ventricle has normal systolic function. The cavity was  normal. There is no increase in right ventricular wall thickness.  3. The mitral valve is grossly normal. There is moderate mitral annular  calcification present.  4. The tricuspid valve is grossly normal.  5. The aortic valve is tricuspid.  6. The aortic root is normal in size and structure.   Neuro/Psych Right occipital tumor  Neuromuscular disease negative psych ROS   GI/Hepatic negative GI ROS, Neg liver ROS,   Endo/Other  diabetes, Type 2, Oral Hypoglycemic Agents  Renal/GU Renal disease     Musculoskeletal negative musculoskeletal ROS (+)   Abdominal   Peds  Hematology  (+) Blood dyscrasia (Plavix), anemia ,   Anesthesia Other Findings Day of surgery medications  reviewed with the patient.  Reproductive/Obstetrics                            Anesthesia Physical Anesthesia Plan  ASA: IV  Anesthesia Plan: General   Post-op Pain Management:    Induction: Intravenous  PONV Risk Score and Plan: 3 and Dexamethasone and Ondansetron  Airway Management Planned: Oral ETT  Additional Equipment: Arterial line  Intra-op Plan:   Post-operative Plan: Possible Post-op intubation/ventilation  Informed Consent: I have reviewed the patients History and Physical, chart, labs and discussed the procedure including the risks, benefits and alternatives for the proposed anesthesia with the patient or authorized representative who has indicated his/her understanding and acceptance.     Dental advisory given  Plan Discussed with: CRNA  Anesthesia Plan Comments: (2 large bore PIV (CVL if unable to obtain adequate peripheral access))      Anesthesia Quick Evaluation

## 2020-07-08 ENCOUNTER — Inpatient Hospital Stay (HOSPITAL_COMMUNITY)
Admission: EM | Disposition: A | Discharge status: Home Health | Payer: Self-pay | Source: Home / Self Care | Attending: Internal Medicine

## 2020-07-08 ENCOUNTER — Inpatient Hospital Stay (HOSPITAL_COMMUNITY): Payer: Medicare HMO | Admitting: Certified Registered Nurse Anesthetist

## 2020-07-08 DIAGNOSIS — G9389 Other specified disorders of brain: Secondary | ICD-10-CM | POA: Diagnosis not present

## 2020-07-08 DIAGNOSIS — I10 Essential (primary) hypertension: Secondary | ICD-10-CM | POA: Diagnosis not present

## 2020-07-08 DIAGNOSIS — I619 Nontraumatic intracerebral hemorrhage, unspecified: Secondary | ICD-10-CM | POA: Diagnosis not present

## 2020-07-08 DIAGNOSIS — C719 Malignant neoplasm of brain, unspecified: Secondary | ICD-10-CM | POA: Diagnosis not present

## 2020-07-08 DIAGNOSIS — E119 Type 2 diabetes mellitus without complications: Secondary | ICD-10-CM | POA: Diagnosis not present

## 2020-07-08 DIAGNOSIS — J984 Other disorders of lung: Secondary | ICD-10-CM | POA: Diagnosis not present

## 2020-07-08 HISTORY — PX: APPLICATION OF CRANIAL NAVIGATION: SHX6578

## 2020-07-08 HISTORY — PX: CRANIOTOMY: SHX93

## 2020-07-08 LAB — BASIC METABOLIC PANEL
Anion gap: 4 — ABNORMAL LOW (ref 5–15)
BUN: 30 mg/dL — ABNORMAL HIGH (ref 8–23)
CO2: 26 mmol/L (ref 22–32)
Calcium: 8.4 mg/dL — ABNORMAL LOW (ref 8.9–10.3)
Chloride: 106 mmol/L (ref 98–111)
Creatinine, Ser: 1.1 mg/dL (ref 0.61–1.24)
GFR, Estimated: >60 mL/min (ref >60)
Glucose, Bld: 249 mg/dL — ABNORMAL HIGH (ref 70–99)
Potassium: 4.6 mmol/L (ref 3.5–5.1)
Sodium: 136 mmol/L (ref 135–145)

## 2020-07-08 LAB — CBC
HCT: 29.4 % — ABNORMAL LOW (ref 39.0–52.0)
Hemoglobin: 8.8 g/dL — ABNORMAL LOW (ref 13.0–17.0)
MCH: 22.8 pg — ABNORMAL LOW (ref 26.0–34.0)
MCHC: 29.9 g/dL — ABNORMAL LOW (ref 30.0–36.0)
MCV: 76.2 fL — ABNORMAL LOW (ref 80.0–100.0)
Platelets: 333 10*3/uL (ref 150–400)
RBC: 3.86 MIL/uL — ABNORMAL LOW (ref 4.22–5.81)
RDW: 21.6 % — ABNORMAL HIGH (ref 11.5–15.5)
WBC: 24 10*3/uL — ABNORMAL HIGH (ref 4.0–10.5)
nRBC: 0.2 % (ref 0.0–0.2)

## 2020-07-08 LAB — GLUCOSE, CAPILLARY
Glucose-Capillary: 199 mg/dL — ABNORMAL HIGH (ref 70–99)
Glucose-Capillary: 214 mg/dL — ABNORMAL HIGH (ref 70–99)
Glucose-Capillary: 233 mg/dL — ABNORMAL HIGH (ref 70–99)

## 2020-07-08 SURGERY — CRANIOTOMY TUMOR EXCISION
Anesthesia: General | Laterality: Right

## 2020-07-08 MED ORDER — LIDOCAINE-EPINEPHRINE 1 %-1:100000 IJ SOLN
INTRAMUSCULAR | Status: AC
  Filled 2020-07-08: qty 1

## 2020-07-08 MED ORDER — LIDOCAINE 2% (20 MG/ML) 5 ML SYRINGE
INTRAMUSCULAR | Status: DC | PRN
  Administered 2020-07-08: 80 mg via INTRAVENOUS

## 2020-07-08 MED ORDER — ROCURONIUM BROMIDE 10 MG/ML (PF) SYRINGE
PREFILLED_SYRINGE | INTRAVENOUS | Status: AC
  Filled 2020-07-08: qty 10

## 2020-07-08 MED ORDER — EPINEPHRINE 1 MG/10ML IJ SOSY
PREFILLED_SYRINGE | INTRAMUSCULAR | Status: AC
  Filled 2020-07-08: qty 10

## 2020-07-08 MED ORDER — SODIUM CHLORIDE 0.9 % IV SOLN: INTRAVENOUS | Status: DC

## 2020-07-08 MED ORDER — SUGAMMADEX SODIUM 200 MG/2ML IV SOLN
INTRAVENOUS | Status: DC | PRN
  Administered 2020-07-08: 200 mg via INTRAVENOUS

## 2020-07-08 MED ORDER — EPHEDRINE 5 MG/ML INJ
INTRAVENOUS | Status: AC
  Filled 2020-07-08: qty 10

## 2020-07-08 MED ORDER — PHENYLEPHRINE 40 MCG/ML (10ML) SYRINGE FOR IV PUSH (FOR BLOOD PRESSURE SUPPORT)
PREFILLED_SYRINGE | INTRAVENOUS | Status: AC
  Filled 2020-07-08: qty 10

## 2020-07-08 MED ORDER — PROPOFOL 500 MG/50ML IV EMUL
INTRAVENOUS | Status: DC | PRN
  Administered 2020-07-08: 20 ug/kg/min via INTRAVENOUS

## 2020-07-08 MED ORDER — PROPOFOL 10 MG/ML IV BOLUS
INTRAVENOUS | Status: AC
  Filled 2020-07-08: qty 20

## 2020-07-08 MED ORDER — SODIUM CHLORIDE 0.9 % IV SOLN
INTRAVENOUS | Status: DC | PRN
  Administered 2020-07-08: .1 ug/kg/min via INTRAVENOUS

## 2020-07-08 MED ORDER — THROMBIN 20000 UNITS EX KIT
PACK | CUTANEOUS | Status: DC | PRN
  Administered 2020-07-08: 20 mL via TOPICAL

## 2020-07-08 MED ORDER — MORPHINE SULFATE (PF) 2 MG/ML IV SOLN
1.0000 mg | INTRAVENOUS | Status: DC | PRN
  Administered 2020-07-10 – 2020-07-12 (×4): 2 mg via INTRAVENOUS
  Filled 2020-07-08 (×4): qty 1

## 2020-07-08 MED ORDER — FENTANYL CITRATE (PF) 250 MCG/5ML IJ SOLN
INTRAMUSCULAR | Status: DC | PRN
  Administered 2020-07-08 (×2): 25 ug via INTRAVENOUS
  Administered 2020-07-08 (×4): 50 ug via INTRAVENOUS

## 2020-07-08 MED ORDER — ROCURONIUM BROMIDE 10 MG/ML (PF) SYRINGE
PREFILLED_SYRINGE | INTRAVENOUS | Status: DC | PRN
  Administered 2020-07-08: 30 mg via INTRAVENOUS
  Administered 2020-07-08: 50 mg via INTRAVENOUS

## 2020-07-08 MED ORDER — DEXAMETHASONE SODIUM PHOSPHATE 10 MG/ML IJ SOLN
INTRAMUSCULAR | Status: DC | PRN
  Administered 2020-07-08: 10 mg via INTRAVENOUS

## 2020-07-08 MED ORDER — FENTANYL CITRATE (PF) 100 MCG/2ML IJ SOLN
INTRAMUSCULAR | Status: AC
  Filled 2020-07-08: qty 2

## 2020-07-08 MED ORDER — BUPIVACAINE-EPINEPHRINE 0.25% -1:200000 IJ SOLN
INTRAMUSCULAR | Status: DC | PRN
  Administered 2020-07-08: 10 mL

## 2020-07-08 MED ORDER — ESMOLOL HCL 100 MG/10ML IV SOLN
INTRAVENOUS | Status: AC
  Filled 2020-07-08: qty 10

## 2020-07-08 MED ORDER — ONDANSETRON HCL 4 MG/2ML IJ SOLN
INTRAMUSCULAR | Status: AC
  Filled 2020-07-08: qty 2

## 2020-07-08 MED ORDER — BACITRACIN ZINC 500 UNIT/GM EX OINT
TOPICAL_OINTMENT | CUTANEOUS | Status: DC | PRN
  Administered 2020-07-08: 1 via TOPICAL

## 2020-07-08 MED ORDER — MANNITOL 25 % IV SOLN
INTRAVENOUS | Status: DC | PRN
  Administered 2020-07-08: 25 g via INTRAVENOUS

## 2020-07-08 MED ORDER — BACITRACIN ZINC 500 UNIT/GM EX OINT
TOPICAL_OINTMENT | CUTANEOUS | Status: AC
  Filled 2020-07-08: qty 28.35

## 2020-07-08 MED ORDER — MICROFIBRILLAR COLL HEMOSTAT EX PADS
MEDICATED_PAD | CUTANEOUS | Status: DC | PRN
  Administered 2020-07-08: 1 via TOPICAL

## 2020-07-08 MED ORDER — ACETAMINOPHEN 500 MG PO TABS: 1000.0000 mg | ORAL_TABLET | Freq: Once | ORAL | Status: DC

## 2020-07-08 MED ORDER — CHLORHEXIDINE GLUCONATE CLOTH 2 % EX PADS
6.0000 | MEDICATED_PAD | Freq: Every day | CUTANEOUS | Status: DC
  Administered 2020-07-08 – 2020-07-09 (×2): 6 via TOPICAL

## 2020-07-08 MED ORDER — PHENYLEPHRINE HCL (PRESSORS) 10 MG/ML IV SOLN
INTRAVENOUS | Status: DC | PRN
  Administered 2020-07-08: 80 ug via INTRAVENOUS
  Administered 2020-07-08: 120 ug via INTRAVENOUS

## 2020-07-08 MED ORDER — LABETALOL HCL 5 MG/ML IV SOLN
INTRAVENOUS | Status: DC | PRN
  Administered 2020-07-08: 10 mg via INTRAVENOUS
  Administered 2020-07-08 (×2): 5 mg via INTRAVENOUS

## 2020-07-08 MED ORDER — PROPOFOL 10 MG/ML IV BOLUS
INTRAVENOUS | Status: DC | PRN
  Administered 2020-07-08: 20 mg via INTRAVENOUS
  Administered 2020-07-08: 130 mg via INTRAVENOUS
  Administered 2020-07-08: 20 mg via INTRAVENOUS

## 2020-07-08 MED ORDER — EPHEDRINE SULFATE 50 MG/ML IJ SOLN
INTRAMUSCULAR | Status: DC | PRN
  Administered 2020-07-08 (×3): 10 mg via INTRAVENOUS

## 2020-07-08 MED ORDER — ONDANSETRON HCL 4 MG/2ML IJ SOLN: 4.0000 mg | Freq: Once | INTRAMUSCULAR | Status: DC | PRN

## 2020-07-08 MED ORDER — THROMBIN 20000 UNITS EX SOLR
CUTANEOUS | Status: AC
  Filled 2020-07-08: qty 20000

## 2020-07-08 MED ORDER — THROMBIN 5000 UNITS EX SOLR
OROMUCOSAL | Status: DC | PRN
  Administered 2020-07-08 (×2): 5 mL via TOPICAL

## 2020-07-08 MED ORDER — SODIUM CHLORIDE 0.9 % IR SOLN
Status: DC | PRN
Start: 2020-07-08 — End: 2020-07-08
  Administered 2020-07-08: 1000 mL

## 2020-07-08 MED ORDER — SODIUM CHLORIDE 0.9 % IV SOLN
0.0500 ug/kg/min | INTRAVENOUS | Status: DC
  Filled 2020-07-08: qty 5000

## 2020-07-08 MED ORDER — 0.9 % SODIUM CHLORIDE (POUR BTL) OPTIME
TOPICAL | Status: DC | PRN
  Administered 2020-07-08 (×3): 1000 mL

## 2020-07-08 MED ORDER — FENTANYL CITRATE (PF) 100 MCG/2ML IJ SOLN
25.0000 ug | INTRAMUSCULAR | Status: DC | PRN
  Administered 2020-07-08 (×4): 25 ug via INTRAVENOUS

## 2020-07-08 MED ORDER — ALBUMIN HUMAN 5 % IV SOLN: INTRAVENOUS | Status: DC | PRN

## 2020-07-08 MED ORDER — THROMBIN 5000 UNITS EX SOLR
CUTANEOUS | Status: AC
  Filled 2020-07-08: qty 5000

## 2020-07-08 MED ORDER — CLEVIDIPINE BUTYRATE 0.5 MG/ML IV EMUL
INTRAVENOUS | Status: DC | PRN
  Administered 2020-07-08: 1 mg/h via INTRAVENOUS

## 2020-07-08 MED ORDER — LIDOCAINE-EPINEPHRINE 1 %-1:100000 IJ SOLN
INTRAMUSCULAR | Status: DC | PRN
  Administered 2020-07-08: 10 mL

## 2020-07-08 MED ORDER — AMISULPRIDE (ANTIEMETIC) 5 MG/2ML IV SOLN: 10.0000 mg | Freq: Once | INTRAVENOUS | Status: DC | PRN

## 2020-07-08 MED ORDER — FENTANYL CITRATE (PF) 250 MCG/5ML IJ SOLN
INTRAMUSCULAR | Status: AC
  Filled 2020-07-08: qty 5

## 2020-07-08 MED ORDER — CEFAZOLIN SODIUM-DEXTROSE 1-4 GM/50ML-% IV SOLN
1.0000 g | Freq: Three times a day (TID) | INTRAVENOUS | Status: AC
  Administered 2020-07-08 (×2): 1 g via INTRAVENOUS
  Filled 2020-07-08 (×2): qty 50

## 2020-07-08 MED ORDER — PROMETHAZINE HCL 12.5 MG PO TABS
12.5000 mg | ORAL_TABLET | ORAL | Status: DC | PRN
  Filled 2020-07-08: qty 2

## 2020-07-08 MED ORDER — HEMOSTATIC AGENTS (NO CHARGE) OPTIME
TOPICAL | Status: DC | PRN
  Administered 2020-07-08 (×2): 1 via TOPICAL

## 2020-07-08 MED ORDER — DEXAMETHASONE SODIUM PHOSPHATE 10 MG/ML IJ SOLN
INTRAMUSCULAR | Status: AC
  Filled 2020-07-08: qty 1

## 2020-07-08 MED ORDER — LEVETIRACETAM IN NACL 500 MG/100ML IV SOLN
500.0000 mg | INTRAVENOUS | Status: AC
  Administered 2020-07-08: 500 mg via INTRAVENOUS
  Filled 2020-07-08 (×2): qty 100

## 2020-07-08 MED ORDER — ONDANSETRON HCL 4 MG/2ML IJ SOLN
INTRAMUSCULAR | Status: DC | PRN
  Administered 2020-07-08: 4 mg via INTRAVENOUS

## 2020-07-08 MED ORDER — ACETAMINOPHEN 10 MG/ML IV SOLN
INTRAVENOUS | Status: DC | PRN
  Administered 2020-07-08: 1000 mg via INTRAVENOUS

## 2020-07-08 MED ORDER — SODIUM CHLORIDE 0.9 % IV SOLN: INTRAVENOUS | Status: DC | PRN

## 2020-07-08 MED ORDER — LABETALOL HCL 5 MG/ML IV SOLN
10.0000 mg | INTRAVENOUS | Status: DC | PRN
  Administered 2020-07-08: 20 mg via INTRAVENOUS
  Filled 2020-07-08: qty 4

## 2020-07-08 MED ORDER — PHENYLEPHRINE HCL-NACL 10-0.9 MG/250ML-% IV SOLN
INTRAVENOUS | Status: DC | PRN
  Administered 2020-07-08: 15 ug/min via INTRAVENOUS

## 2020-07-08 SURGICAL SUPPLY — 90 items
BAND INSRT 18 STRL LF DISP RB (MISCELLANEOUS) ×4
BAND RUBBER #18 3X1/16 STRL (MISCELLANEOUS) ×8 IMPLANT
BIT DRILL WIRE PASS 1.3MM (BIT) IMPLANT
BLADE CLIPPER SURG (BLADE) ×4 IMPLANT
BUR CARBIDE MATCH 3.0 (BURR) IMPLANT
BUR SPIRAL ROUTER 2.3 (BUR) ×3 IMPLANT
BUR SPIRAL ROUTER 2.3MM (BUR) ×1
CANISTER SUCT 3000ML PPV (MISCELLANEOUS) ×4 IMPLANT
CARTRIDGE OIL MAESTRO DRILL (MISCELLANEOUS) IMPLANT
CLOSURE WOUND 1/2 X4 (GAUZE/BANDAGES/DRESSINGS) ×1
CNTNR URN SCR LID CUP LEK RST (MISCELLANEOUS) ×2 IMPLANT
CONT SPEC 4OZ STRL OR WHT (MISCELLANEOUS) ×4
COVER BURR HOLE 14 (Orthopedic Implant) ×16 IMPLANT
COVER WAND RF STERILE (DRAPES) ×4 IMPLANT
DIFFUSER DRILL AIR PNEUMATIC (MISCELLANEOUS) ×4 IMPLANT
DRAIN JACKSON PRATT 1/4 1325 (MISCELLANEOUS) IMPLANT
DRAIN JACKSON RD 7FR 3/32 (WOUND CARE) IMPLANT
DRAPE MICROSCOPE LEICA (MISCELLANEOUS) ×4 IMPLANT
DRAPE NEUROLOGICAL W/INCISE (DRAPES) ×4 IMPLANT
DRAPE SHEET LG 3/4 BI-LAMINATE (DRAPES) ×4 IMPLANT
DRAPE SURG 17X23 STRL (DRAPES) IMPLANT
DRAPE WARM FLUID 44X44 (DRAPES) ×4 IMPLANT
DRILL WIRE PASS 1.3MM (BIT)
DRSG AQUACEL ADVANTAGE 4X5 (GAUZE/BANDAGES/DRESSINGS) IMPLANT
DRSG AQUACEL AG ADV 3.5X 6 (GAUZE/BANDAGES/DRESSINGS) ×8 IMPLANT
DURAPREP 6ML APPLICATOR 50/CS (WOUND CARE) ×4 IMPLANT
ELECT COATED BLADE 2.86 ST (ELECTRODE) ×4 IMPLANT
ELECT REM PT RETURN 9FT ADLT (ELECTROSURGICAL) ×4
ELECTRODE REM PT RTRN 9FT ADLT (ELECTROSURGICAL) ×2 IMPLANT
EVACUATOR SILICONE 100CC (DRAIN) IMPLANT
FORCEPS BIPO MALIS IRRIG 9X1.5 (NEUROSURGERY SUPPLIES) ×4 IMPLANT
GAUZE 4X4 16PLY RFD (DISPOSABLE) IMPLANT
GAUZE SPONGE 4X4 12PLY STRL (GAUZE/BANDAGES/DRESSINGS) IMPLANT
GLOVE ECLIPSE 8.0 STRL XLNG CF (GLOVE) ×8 IMPLANT
GLOVE EXAM NITRILE XL STR (GLOVE) IMPLANT
GLOVE SRG 8 PF TXTR STRL LF DI (GLOVE) ×6 IMPLANT
GLOVE SURG UNDER POLY LF SZ8 (GLOVE) ×12
GOWN STRL REUS W/ TWL LRG LVL3 (GOWN DISPOSABLE) ×4 IMPLANT
GOWN STRL REUS W/ TWL XL LVL3 (GOWN DISPOSABLE) ×4 IMPLANT
GOWN STRL REUS W/TWL 2XL LVL3 (GOWN DISPOSABLE) IMPLANT
GOWN STRL REUS W/TWL LRG LVL3 (GOWN DISPOSABLE) ×8
GOWN STRL REUS W/TWL XL LVL3 (GOWN DISPOSABLE) ×8
GRAFT DURAGEN MATRIX 3WX3L (Graft) ×4 IMPLANT
GRAFT DURAGEN MATRIX 3X3 SNGL (Graft) ×2 IMPLANT
HEMOSTAT POWDER KIT SURGIFOAM (HEMOSTASIS) ×4 IMPLANT
HEMOSTAT POWDER SURGIFOAM 1G (HEMOSTASIS) ×4 IMPLANT
HEMOSTAT SURGICEL 2X14 (HEMOSTASIS) ×4 IMPLANT
HEMOSTAT SURGICEL 2X4 FIBR (HEMOSTASIS) ×4 IMPLANT
IV NS 1000ML (IV SOLUTION) ×4
IV NS 1000ML BAXH (IV SOLUTION) ×2 IMPLANT
KIT BASIN OR (CUSTOM PROCEDURE TRAY) ×4 IMPLANT
KIT TURNOVER KIT B (KITS) ×4 IMPLANT
MARKER SPHERE PSV REFLC 13MM (MARKER) ×8 IMPLANT
NEEDLE HYPO 22GX1.5 SAFETY (NEEDLE) ×4 IMPLANT
NS IRRIG 1000ML POUR BTL (IV SOLUTION) ×12 IMPLANT
OIL CARTRIDGE MAESTRO DRILL (MISCELLANEOUS)
PACK CRANIOTOMY CUSTOM (CUSTOM PROCEDURE TRAY) ×4 IMPLANT
PATTIES SURGICAL .5 X.5 (GAUZE/BANDAGES/DRESSINGS) IMPLANT
PATTIES SURGICAL .5 X3 (DISPOSABLE) ×4 IMPLANT
PATTIES SURGICAL 1X1 (DISPOSABLE) IMPLANT
PERFORATOR LRG  14-11MM (BIT) ×2
PERFORATOR LRG 14-11MM (BIT) ×2 IMPLANT
PIN MAYFIELD SKULL DISP (PIN) ×4 IMPLANT
RETRACTOR LONE STAR DISPOSABLE (INSTRUMENTS) IMPLANT
SCREW UNIII AXS SD 1.5X4 (Screw) ×60 IMPLANT
SEALANT ADHERUS EXTEND TIP (MISCELLANEOUS) ×4 IMPLANT
SET CARTRIDGE AND TUBING (SET/KITS/TRAYS/PACK) IMPLANT
SET TUBING IRRIGATION DISP (TUBING) ×4 IMPLANT
SPONGE NEURO XRAY DETECT 1X3 (DISPOSABLE) IMPLANT
SPONGE SURGIFOAM ABS GEL 100 (HEMOSTASIS) ×4 IMPLANT
STAPLER VISISTAT 35W (STAPLE) ×4 IMPLANT
STOCKINETTE 6  STRL (DRAPES) ×2
STOCKINETTE 6 STRL (DRAPES) ×2 IMPLANT
STRIP CLOSURE SKIN 1/2X4 (GAUZE/BANDAGES/DRESSINGS) ×3 IMPLANT
SUT ETHILON 3 0 FSL (SUTURE) IMPLANT
SUT ETHILON 3 0 PS 1 (SUTURE) IMPLANT
SUT NURALON 4 0 TR CR/8 (SUTURE) ×12 IMPLANT
SUT VIC AB 0 CT1 18XCR BRD8 (SUTURE) ×2 IMPLANT
SUT VIC AB 0 CT1 8-18 (SUTURE) ×4
SUT VIC AB 2-0 CP2 18 (SUTURE) ×4 IMPLANT
SUT VICRYL RAPIDE 4/0 PS 2 (SUTURE) ×4 IMPLANT
TIP SHEAR CVD EXTENDED 36KH (INSTRUMENTS) IMPLANT
TOWEL GREEN STERILE (TOWEL DISPOSABLE) ×4 IMPLANT
TOWEL GREEN STERILE FF (TOWEL DISPOSABLE) ×4 IMPLANT
TRAY FOLEY MTR SLVR 16FR STAT (SET/KITS/TRAYS/PACK) ×4 IMPLANT
TUBE CONNECTING 12'X1/4 (SUCTIONS) ×1
TUBE CONNECTING 12X1/4 (SUCTIONS) ×3 IMPLANT
UNDERPAD 30X36 HEAVY ABSORB (UNDERPADS AND DIAPERS) ×4 IMPLANT
WATER STERILE IRR 1000ML POUR (IV SOLUTION) ×4 IMPLANT
WRENCH TORQUE 36KHZ (INSTRUMENTS) IMPLANT

## 2020-07-08 NOTE — Anesthesia Procedure Notes (Signed)
Arterial Line Insertion Start/End4/04/2020 7:00 AM, 07/08/2020 7:05 AM Performed by: Kyung Rudd, CRNA  Patient location: Pre-op. Preanesthetic checklist: patient identified, IV checked, site marked, risks and benefits discussed, surgical consent, monitors and equipment checked, pre-op evaluation, timeout performed and anesthesia consent Lidocaine 1% used for infiltration Left, radial was placed Catheter size: 20 Fr Hand hygiene performed  and maximum sterile barriers used   Attempts: 1 Procedure performed without using ultrasound guided technique. Following insertion, dressing applied. Post procedure assessment: normal and unchanged

## 2020-07-08 NOTE — Progress Notes (Signed)
   Providing Compassionate, Quality Care - Together  NEUROSURGERY PROGRESS NOTE   S: No issues overnight.  O: EXAM:  BP (!) 139/59 (BP Location: Left Arm)   Pulse (!) 57   Temp 97.6 F (36.4 C) (Oral)   Resp 18   Ht 5\' 9"  (1.753 m)   Wt 81.6 kg   SpO2 96%   BMI 26.58 kg/m   Awake alert oriented x3 Poor dentition No acute distress Face symmetric PERRLA EOMI Left-sided neglect Left hemianopsia No drift Bilateral upper extremities 4+/5 Bilateral lower extremities 4+5 (except forleft BKA) Sensory intact light touch   ASSESSMENT: 80 y.o.malewith   1. Right occipital tumor  PLAN: -OR  this a.m. -All risks, benefits and expected outcomes were discussed with the patient.  He agrees to proceed.  I discussed all these also with the patient's son and brother in person and over the phone.  Answered all of their questions.  They agreed to proceed. -Blood on hold for starting hemoglobin of 8.8  Thank you for allowing me to participate in this patient's care.  Please do not hesitate to call with questions or concerns.   Elwin Sleight, [facility] Cell: [phone]

## 2020-07-08 NOTE — Progress Notes (Signed)
NAME:  Thomas Garcia, MRN:  [id number], DOB:  1940-07-05, LOS: 12 ADMISSION DATE:  06/26/2020, CONSULTATION DATE:  06/27/2020 REFERRING MD:  Dr. Myna Hidalgo, CHIEF COMPLAINT:  Lung mass    History of Present Illness:  Thomas Garcia is a 80 y.o. 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 Of note patient was seen at this health system 1 month ago for cough and hemoptysis and was found to have small bilateral age-indeterminate PE's and a right upper lobe mass but unfortunately patient refused admission.   Since that ED visit patient has experienced worsened  generalized weakness, fatigue, nausea, hemoptysis, and loss of appetite. He reports a ~80lb weight loss over the span of a year. Additional he reports chills, vision changes, progressive weakness, and cough with hemoptysis. He reports a 80ppd smoking history.   Given lung mass with high suspicion for metastatic cancer PCCM was consulted for assistance in tissue sampling.    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 Admitted 3/21 . CCM signed off 3/25 . 3/29 IVC filter placement . OR 4/1 for R crani and resection, extubated but will come to the ICU, ccm re-consulted   Interim History / Subjective:   S/p R crani 4/1 Extubated at end of case, in PACU, will got to ICU for monitoring  Objective   Blood pressure (!) 139/50, pulse 60, temperature (!) 97 F (36.1 C), resp. rate 12, height 5\' 9"  (1.753 m), weight 81.6 kg, SpO2 97 %.        Intake/Output Summary (Last 24 hours) at 07/08/2020 1256 Last data filed at 07/08/2020 1146 Gross per 24 hour  Intake 2405 ml  Output 1550 ml  Net 855 ml   Filed Weights   06/26/20 1856  Weight: 81.6 kg    Examination: General: chronically ill appearing older adult M, reclined  in PACU bed NAD  HEENT: R crani surgical site with fresh dried blood. Pink tacky mm. Trachea midline Neuro: Drowsy, awakens to voice. Pleasantly confused, following commands CV: bradycardic, regular. Cap refill brisk, 2+ radial pulse PULM:   Symmetrical chest expansion, even and unlabored respirations. Diminished bibasilar sounds GI: Obese soft round non tender extremities:  L BKa. No acute deformity. No cyanosis or clubbing.  GU : foley with yellow urine Skin: pale, c/d/cool no rash  Labs/imaging that I havepersonally reviewed    CT C/A/P> large lung mass with likely lymphangitic spread, mediastinal adenopathy.  Brain MRI 3/21> motion artifact, mass in occiput redemonstrated with vasogenic edema  Resolved Hospital Problem list     Assessment & Plan:   R occipital tumor, s/p crani and resection 4/1 -vasogenic edema, intrinsic hemorrhage noted on admission imaging  RUL lung mass -suspicious for metastatic lung ca P: -post op per NSGY -decadron, keppra -pulmonary hygiene, mobility as able -if massive hemoptysis, would need IR for embolization of mass   PE s/p IVC filter placement 3/29 Hemoptysis, improved (could be from PE vs lung cancer)  -anticoag on hold  HTN -lisinopril -PRN labetalol  DM with acute Hyperglycemia -on steroids for brain tumor P -rSSI + HS SSI + 10units novolog TID + BID lantus -consult DM coordinator for recs. Looks like 4/1 1x lantus was held, give this in ICU and re-eval-- might need to increase something acutely  Leukocytosis -  in setting of steroids   Chronic anemia -EBL 550 ml 4/1 in OR, got 1PRBC P -trend PRN, dont feel like he absolutely needs post op cbc  L/t/d -L radial art line -- can dc 4/1 -foley -- can dc 4/1   Best practice (evaluated daily)  Diet - NPO post op until more awake VTE ppx- on hold for OR 4/1, has IVC filter in setting of PE GI ppx- protonix (on steroids) VAP - n/a PAD- n/a Code Status- Full Dispo- ICU after  OR 4/1   Labs   CBC: Recent Labs  Lab 07/02/20 0057 07/05/20 0455 07/06/20 0811 07/08/20 0124  WBC 18.5* 19.3* 20.2* 24.0*  HGB 9.7* 9.2* 9.6* 8.8*  HCT 33.1* 29.7* 32.3* 29.4*  MCV 77.5* 74.8* 76.0* 76.2*  PLT 574* 452* 453* 350    Basic Metabolic Panel: Recent Labs  Lab 07/05/20 0455 07/07/20 1153 07/08/20 0124  NA 136 137 136  K 4.7 4.5 4.6  CL 105 105 106  CO2 27 27 26   GLUCOSE 239* 302* 249*  BUN 25* 29* 30*  CREATININE 0.77 0.86 1.10  CALCIUM 8.4* 8.5* 8.4*    CCT: n/a   Eliseo Gum MSN, AGACNP-BC [place] [phone] If no answer, [phone] 07/08/2020, 1:50 PM

## 2020-07-08 NOTE — Progress Notes (Signed)
   Providing Compassionate, Quality Care - Together  NEUROSURGERY PROGRESS NOTE   S: pt s/e in pacu, no complaints  O: EXAM:  BP (!) 130/51 (BP Location: Right Arm)   Pulse 64   Temp (!) 97 F (36.1 C)   Resp 16   Ht 5\' 9"  (1.753 m)   Wt 81.6 kg   SpO2 98%   BMI 26.58 kg/m   Awake, alert,  Mumbling words FCx4 Face symmetric  PERRL EOMI, could not assess VF 5/5 BUE/BLE  Incision c/d/i  ASSESSMENT:  80 y.o. male with   1. Right occipital tumor, prelim metastatic  -s/p R crani for resection 07/08/2020  PLAN: -ICU  -mri tom -keppra -pt/ot -pain control -sbp control -ccm consulted for med management  Thank you for allowing me to participate in this patient's care.  Please do not hesitate to call with questions or concerns.   Elwin Sleight, [facility] Cell: [phone]

## 2020-07-08 NOTE — Transfer of Care (Signed)
Immediate Anesthesia Transfer of Care Note  Patient: Thomas Garcia  Procedure(s) Performed: Right Occipital craniotomy for tumor resection with brainlab (Right ) APPLICATION OF CRANIAL NAVIGATION (N/A )  Patient Location: PACU  Anesthesia Type:General  Level of Consciousness: drowsy  Airway & Oxygen Therapy: Patient Spontanous Breathing and Patient connected to face mask oxygen  Post-op Assessment: Report given to RN and Post -op Vital signs reviewed and stable  Post vital signs: Reviewed and stable  Last Vitals:  Vitals Value Taken Time  BP 112/50 07/08/20 1151  Temp    Pulse 63 07/08/20 1156  Resp 13 07/08/20 1156  SpO2 96 % 07/08/20 1156  Vitals shown include unvalidated device data.  Last Pain:  Vitals:   07/08/20 0400  TempSrc: Oral  PainSc:          Complications: No complications documented.

## 2020-07-08 NOTE — Progress Notes (Signed)
Thomas Garcia  GDJ:[id number] DOB: 1940/08/04 DOA: 06/26/2020 PCP: Doree Albee, MD    Brief Narrative:  80yo with a hx of HTN, DM 2, and PAD who presented to the ER with severe generalized weakness and fatigue, nausea, loss of appetite, and scant hemoptysis. 1 month prior he presented to an ER with shortness of breath and 6 months of hemoptysis and was found to have small bilateral pulmonary emboli as well as a masslike consolidation in the right upper lobe with cavitation.  He refused admission at that time.  Upon his return to the ER for this admission a CT head revealed evidence of a right occipital mass.  3/29 IVC filter placed  Antimicrobials:  Rocephin 3/21 >3/25  DVT prophylaxis: Subcutaneous heparin  Consultants:  Pulmonary Neurosurgery  Subjective: He feels agitated today. Admits to feeling anxious. Wants to go home. Agreeable to take medications for anxiety which should help him relax. No shortness of breath.  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 Was treated with empiric antibiotic for possible postobstructive pneumonia - Pulmonary following peripherally - if develops gross hemoptysis will need IR directed embolization  Right occipital mass -suspected metastatic lung cancer Complicated by mild hemorrhage and vasogenic edema resulting in a 2 mm right-to-left shift - Neurosurgery following - continue decadron and empiric keppra - s/p stereotactic right occipital craniotomy for resection of mass - post op care per neurosurgery - follow up pathology  Pulmonary embolism Full anticoagulation on hold in setting of hemoptysis and bleeding from brain mass - no evidence of DVT on bilateral lower extremity venous duplex - began SQ heparin at prophy dose 3/25 - monitor closely for bleeding - IR placed a retrievable IVC filter 3/29  Chronic iron deficiency anemia Likely a consequence of his previously undiagnosed cancer - He did have some  bleeding during surgery and was transfused 1 unit prbc - continue to follow hemoglobin  PAD Status post left BKA - Plavix necessarily on hold due to above - continue statin - followed by Dr. Carlis Abbott  HTN Blood pressure trending upward -adjust medical therapy and follow  Uncontrolled DM 2 w/ hyperglycemia A1c 6.4 - CBG continues to trend up -further adjustments made to Lantus and novolog  GERD Well compensated  Hyponatremia Corrected  Code Status: FULL CODE Family Communication: No family present at time of exam, updated son, Ed, over the phone 4/1 Status is: Inpatient  Remains inpatient appropriate because:Inpatient level of care appropriate due to severity of illness   Dispo: The patient is from: Home              Anticipated d/c is to: unclear              Patient currently is not medically stable to d/c.   Difficult to place patient No     Objective: Blood pressure (!) 168/74, pulse 64, temperature (!) 97.5 F (36.4 C), temperature source Oral, resp. rate 13, height 5\' 9"  (1.753 m), weight 81.6 kg, SpO2 100 %.  Intake/Output Summary (Last 24 hours) at 07/08/2020 1832 Last data filed at 07/08/2020 1800 Gross per 24 hour  Intake 2453.56 ml  Output 1850 ml  Net 603.56 ml   Filed Weights   06/26/20 1856  Weight: 81.6 kg    Examination: General exam: Alert, awake, oriented x 3 HEENT: incision over right occipital skull Respiratory system: Clear to auscultation. Respiratory effort normal. Cardiovascular system:RRR. No murmurs, rubs, gallops. Gastrointestinal system: Abdomen is nondistended, soft and  nontender. No organomegaly or masses felt. Normal bowel sounds heard. Central nervous system: Alert and oriented. No focal neurological deficits. Extremities: left BKA Skin: No rashes, lesions or ulcers Psychiatry: Judgement and insight appear normal. Mood & affect appropriate.      CBC: Recent Labs  Lab 07/05/20 0455 07/06/20 0811 07/08/20 0124  WBC 19.3* 20.2*  24.0*  HGB 9.2* 9.6* 8.8*  HCT 29.7* 32.3* 29.4*  MCV 74.8* 76.0* 76.2*  PLT 452* 453* 546   Basic Metabolic Panel: Recent Labs  Lab 07/05/20 0455 07/07/20 1153 07/08/20 0124  NA 136 137 136  K 4.7 4.5 4.6  CL 105 105 106  CO2 27 27 26   GLUCOSE 239* 302* 249*  BUN 25* 29* 30*  CREATININE 0.77 0.86 1.10  CALCIUM 8.4* 8.5* 8.4*   GFR: Estimated Creatinine Clearance: 56.2 mL/min (by C-G formula based on SCr of 1.1 mg/dL).  Liver Function Tests: No results for input(s): AST, ALT, ALKPHOS, BILITOT, PROT, ALBUMIN in the last 168 hours.  Coagulation Profile: Recent Labs  Lab 07/07/20 1807  INR 1.0    HbA1C: Hgb A1c MFr Bld  Date/Time Value Ref Range Status  06/27/2020 05:29 AM 6.4 (H) 4.8 - 5.6 % Final    Comment:    (NOTE) Pre diabetes:          5.7%-6.4%  Diabetes:              >6.4%  Glycemic control for   <7.0% adults with diabetes   04/14/2020 12:00 AM 5.6 <5.7 % of total Hgb Final    Comment:    For the purpose of screening for the presence of diabetes: . <5.7%       Consistent with the absence of diabetes 5.7-6.4%    Consistent with increased risk for diabetes             (prediabetes) > or =6.5%  Consistent with diabetes . This assay result is consistent with a decreased risk of diabetes. . Currently, no consensus exists regarding use of hemoglobin A1c for diagnosis of diabetes in children. . According to American Diabetes Association (ADA) guidelines, hemoglobin A1c <7.0% represents optimal control in non-pregnant diabetic patients. Different metrics may apply to specific patient populations.  Standards of Medical Care in Diabetes(ADA). .     CBG: Recent Labs  Lab 07/07/20 1606 07/07/20 2118 07/07/20 2135 07/08/20 1151 07/08/20 1645  GLUCAP 178* 225* 249* 199* 214*    No results found for this or any previous visit (from the past 240 hour(s)).   Scheduled Meds: . atorvastatin  10 mg Oral q1800  . Chlorhexidine Gluconate Cloth  6  each Topical Daily  . dexamethasone  4 mg Oral Q6H  . insulin aspart  0-20 Units Subcutaneous TID WC  . insulin aspart  0-5 Units Subcutaneous QHS  . insulin aspart  10 Units Subcutaneous TID WC  . insulin glargine  30 Units Subcutaneous BID  . levETIRAcetam  500 mg Oral BID  . lisinopril  5 mg Oral Daily  . pantoprazole  40 mg Oral Q0600  . traZODone  50 mg Oral QHS    . sodium chloride 75 mL/hr at 07/08/20 1800  .  ceFAZolin (ANCEF) IV Stopped (07/08/20 1604)    LOS: 12 days   Kathie Dike, MD [facility] Office  [phone] Pager - Text Page per Amion  If 7PM-7AM, please contact night-coverage per Amion 07/08/2020, 6:32 PM

## 2020-07-08 NOTE — Anesthesia Procedure Notes (Signed)
Procedure Name: Intubation Date/Time: 07/08/2020 8:17 AM Performed by: Clearnce Sorrel, CRNA Pre-anesthesia Checklist: Patient identified, Emergency Drugs available, Suction available, Patient being monitored and Timeout performed Patient Re-evaluated:Patient Re-evaluated prior to induction Oxygen Delivery Method: Circle system utilized Preoxygenation: Pre-oxygenation with 100% oxygen Induction Type: IV induction Ventilation: Mask ventilation without difficulty and Oral airway inserted - appropriate to patient size Laryngoscope Size: Mac and 4 Grade View: Grade I Tube type: Oral Tube size: 7.5 mm Number of attempts: 1 Airway Equipment and Method: Stylet Placement Confirmation: ETT inserted through vocal cords under direct vision,  positive ETCO2 and breath sounds checked- equal and bilateral Secured at: 23 cm Tube secured with: Tape Dental Injury: Teeth and Oropharynx as per pre-operative assessment

## 2020-07-08 NOTE — Anesthesia Postprocedure Evaluation (Signed)
Anesthesia Post Note  Patient: Thomas Garcia  Procedure(s) Performed: Right Occipital craniotomy for tumor resection with brainlab (Right ) APPLICATION OF CRANIAL NAVIGATION (N/A )     Patient location during evaluation: PACU Anesthesia Type: General Level of consciousness: awake and alert, awake and oriented Pain management: pain level controlled Vital Signs Assessment: post-procedure vital signs reviewed and stable Respiratory status: spontaneous breathing, nonlabored ventilation and respiratory function stable Cardiovascular status: blood pressure returned to baseline and stable Postop Assessment: no apparent nausea or vomiting Anesthetic complications: no   No complications documented.  Last Vitals:  Vitals:   07/08/20 1515 07/08/20 1520  BP: (!) 156/73   Pulse: 65 (!) 49  Resp: 13 15  Temp:    SpO2: 99% 98%    Last Pain:  Vitals:   07/08/20 1525  TempSrc:   PainSc: 7                  Catalina Gravel

## 2020-07-08 NOTE — Op Note (Signed)
Providing Compassionate, Quality Care - Together  Date of service: 07/08/2020   PREOP DIAGNOSIS:  1. Large right occipital hemorrhagic metastatic lesion with mass-effect and surrounding edema 2. Lung tumor  POSTOP DIAGNOSIS: Same  PROCEDURE: 1. Stereotactic right occipital craniotomy for resection of mass 2. Intraoperative use of stereotaxy 3. Intraoperative use of microscope for microdissection  SURGEON: Dr. Pieter Partridge C. Adi Doro, DO  ASSISTANT: Dr. Sherley Bounds, MD  ANESTHESIA: General Endotracheal  EBL: 200 cc  SPECIMENS: Right occipital tumor, prelim path metastatic lesion  DRAINS: None  COMPLICATIONS: None  CONDITION: Stable  HISTORY: Thomas Garcia is a 80 y.o. male presented with altered mental status and was found to have a large right occipital hemorrhagic tumor.  Extensive work-up and metastatic work-up revealed also a lung tumor therefore likely this was a metastatic lung lesion.  He was on Plavix therefore we had to monitor him closely and wait for Plavix to be out of the system for intervention.  Due to the size of the occipital tumor which was up to 6 cm, I recommended surgical resection for tissue diagnosis as well as removal for treatment.  I discussed all risks, benefits and alternatives and expected outcomes with the patient, his son and his brother and they agreed to proceed.  PROCEDURE IN DETAIL: The patient was brought to the operating room. After induction of general anesthesia, the patient head was placed in the [place] head holder, and he was was positioned on the operative table in the prone position. All pressure points were meticulously padded.  The BrainLab stereotaxy was registered and checked to have excellent accuracy.  The occipital region on the right scalp was clipped free of hair.  Skin incision was then marked out and prepped and draped in the usual sterile fashion.  Physician driven timeout was performed.  Local anesthetic was injected into the  incision.  Incision was made with a 10 blade down to the pericranium.  Hemostasis was achieved with bipolar cautery.  Periosteal dissection was performed Bovie cautery to expose the cranium.  Self-retaining retractors were placed in the wound.  Using neuro navigation, a craniotomy was planned just adjacent to the sagittal sinus and superior to the tentorium.  A craniotomy was performed using a high-speed drill and a normal fashion.  This was elevated gently.  The microscope was sterilely draped and brought into the field.  The dura was encountered, epidural hemostasis was achieved with bipolar cautery and Surgi-Flo and fibrillar.  A C shaped durotomy was made with a 15 blade and Metzenbaum scissors based along the superior sagittal sinus.  Upon reflecting the dura medially, there was a large medial dark appearing lesion that appeared to have old hemorrhagic components.  Using bipolar cautery and 15 blade the tumor capsule was dissected using microsurgical technique from the surrounding brain.  This was performed circumferentially while coagulating the capsule of the tumor and disconnecting any peritumoral feeders.  There were multiple small peritumoral feeders throughout.  Throughout the dissection the plane was somewhat irregular and there were multiple lobulated regions of hemorrhage which were evacuated with suction.  A small piece of tumor was sent for frozen pathology which preliminarily came back as metastatic lesion.  Once a generous amount of the tumor capsule had been dissected free of the tumor superiorly laterally and inferiorly, the tumor was debulked with tumor forceps and suction.  Once the tumor was debulked I could then mobilize it from inferior to superior to continue further dissection and disconnection from the  falx and medial occipital lobe.  Once the tumor was completely disconnected it was removed and sent for permanent pathology.  Tumor bed hemostasis was achieved with bipolar cautery.   Copious irrigation was used and there was excellent hemostasis.  Using neuro navigation, the borders of the resection cavity were analyzed and noted to be free of tumor capsule.  The surgical cavity was filled with Surgi-Flo and cotton balls for hemostasis.  While this was hemostasis, I plated the bone flap with the cranial plating system.  The cotton balls were removed and the wound again was copiously irrigated and noted to be excellently hemostatic.  The surgical resection cavity was lined with Surgicel and Surgifoam.  The dura was then closed with 4-0 Nurolon in a running and interrupted fashion.  Epidural hemostasis was achieved with Surgi-Flo.  DuraGen was placed along the durotomy site and adherence glue was placed.  The bone flap was then placed back in its normal position with the cranial plating system.  Self-retaining retractors were taken of the wound.  The wound was copiously irrigated.  Hemostasis was achieved with bipolar cautery.  Fascia was closed with 0 Vicryl sutures.  The remainder of galea and dermis was closed with 2-0 Vicryl sutures.  Skin was then closed with staples.  Sterile dressing was applied.  Drapes were taken down.  At the end of the case all sponge, needle, and instrument counts were correct. The patient was then transferred to the stretcher, head holder was removed, extubated, and taken to the post-anesthesia care unit in stable hemodynamic condition.

## 2020-07-08 NOTE — Progress Notes (Signed)
PT Cancellation Note  Patient Details Name: ZAHMIR LALLA MRN: [id number] DOB: 04/23/1940   Cancelled Treatment:    Reason Eval/Treat Not Completed: Patient at procedure or test/unavailable   Alvine Mostafa B Avaleigh Decuir 07/08/2020, 7:45 AM  Bayard Males, PT Acute Rehabilitation Services Pager: [phone] Office: [phone]

## 2020-07-09 ENCOUNTER — Inpatient Hospital Stay (HOSPITAL_COMMUNITY): Payer: Medicare HMO

## 2020-07-09 DIAGNOSIS — C719 Malignant neoplasm of brain, unspecified: Secondary | ICD-10-CM | POA: Diagnosis not present

## 2020-07-09 DIAGNOSIS — G9389 Other specified disorders of brain: Secondary | ICD-10-CM | POA: Diagnosis not present

## 2020-07-09 DIAGNOSIS — E119 Type 2 diabetes mellitus without complications: Secondary | ICD-10-CM | POA: Diagnosis not present

## 2020-07-09 DIAGNOSIS — J984 Other disorders of lung: Secondary | ICD-10-CM | POA: Diagnosis not present

## 2020-07-09 DIAGNOSIS — I10 Essential (primary) hypertension: Secondary | ICD-10-CM | POA: Diagnosis not present

## 2020-07-09 LAB — POCT I-STAT 7, (LYTES, BLD GAS, ICA,H+H)
Acid-base deficit: 1 mmol/L (ref 0.0–2.0)
Bicarbonate: 24.8 mmol/L (ref 20.0–28.0)
Calcium, Ion: 1.11 mmol/L — ABNORMAL LOW (ref 1.15–1.40)
HCT: 32 % — ABNORMAL LOW (ref 39.0–52.0)
Hemoglobin: 10.9 g/dL — ABNORMAL LOW (ref 13.0–17.0)
O2 Saturation: 100 %
Patient temperature: 35.3
Potassium: 4.8 mmol/L (ref 3.5–5.1)
Sodium: 137 mmol/L (ref 135–145)
TCO2: 26 mmol/L (ref 22–32)
pCO2 arterial: 43.6 mmHg (ref 32.0–48.0)
pH, Arterial: 7.355 (ref 7.350–7.450)
pO2, Arterial: 282 mmHg — ABNORMAL HIGH (ref 83.0–108.0)

## 2020-07-09 LAB — BASIC METABOLIC PANEL
Anion gap: 4 — ABNORMAL LOW (ref 5–15)
BUN: 20 mg/dL (ref 8–23)
CO2: 25 mmol/L (ref 22–32)
Calcium: 7.7 mg/dL — ABNORMAL LOW (ref 8.9–10.3)
Chloride: 107 mmol/L (ref 98–111)
Creatinine, Ser: 0.83 mg/dL (ref 0.61–1.24)
GFR, Estimated: >60 mL/min (ref >60)
Glucose, Bld: 116 mg/dL — ABNORMAL HIGH (ref 70–99)
Potassium: 4.2 mmol/L (ref 3.5–5.1)
Sodium: 136 mmol/L (ref 135–145)

## 2020-07-09 LAB — TYPE AND SCREEN
ABO/RH(D): A POS
Antibody Screen: NEGATIVE
Unit division: 0
Unit division: 0

## 2020-07-09 LAB — GLUCOSE, CAPILLARY
Glucose-Capillary: 98 mg/dL (ref 70–99)
Glucose-Capillary: 103 mg/dL — ABNORMAL HIGH (ref 70–99)
Glucose-Capillary: 115 mg/dL — ABNORMAL HIGH (ref 70–99)
Glucose-Capillary: 140 mg/dL — ABNORMAL HIGH (ref 70–99)

## 2020-07-09 LAB — BPAM RBC
Blood Product Expiration Date: 202204262359
Blood Product Expiration Date: 202204262359
ISSUE DATE / TIME: 202204010937
ISSUE DATE / TIME: 202204010937
Unit Type and Rh: 6200
Unit Type and Rh: 6200

## 2020-07-09 LAB — CBC
HCT: 33.2 % — ABNORMAL LOW (ref 39.0–52.0)
Hemoglobin: 10.3 g/dL — ABNORMAL LOW (ref 13.0–17.0)
MCH: 24 pg — ABNORMAL LOW (ref 26.0–34.0)
MCHC: 31 g/dL (ref 30.0–36.0)
MCV: 77.4 fL — ABNORMAL LOW (ref 80.0–100.0)
Platelets: 248 10*3/uL (ref 150–400)
RBC: 4.29 MIL/uL (ref 4.22–5.81)
RDW: 20.6 % — ABNORMAL HIGH (ref 11.5–15.5)
WBC: 23.9 10*3/uL — ABNORMAL HIGH (ref 4.0–10.5)
nRBC: 0.1 % (ref 0.0–0.2)

## 2020-07-09 IMAGING — MR MR HEAD WO/W CM
9 of 15 series · 26 of 48 positions shown · IV contrast (gadavist)
Comparison: Previous brain MRI from [DATE].

CLINICAL DATA: Follow-up examination status post right-sided
craniotomy for tumor resection.

EXAM:
MRI HEAD WITHOUT AND WITH CONTRAST
TECHNIQUE: Multiplanar, multiecho pulse sequences of the brain and surrounding
structures were obtained without and with intravenous contrast.
CONTRAST:  7.5mL GADAVIST GADOBUTROL 1 MMOL/ML IV SOLN

[Series 2: DWI · axial · 3.0mm · 0.94mm/px · z∈[-40,+104]mm · 6 of 100 slices shown (1 of 4)]
[im 1/100]
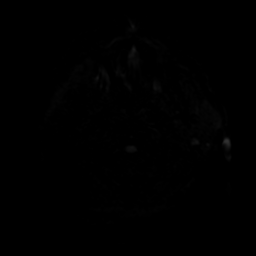
[im 20/100]
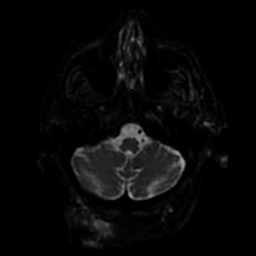
[im 40/100]
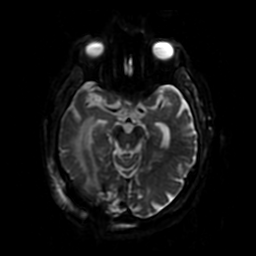
[im 60/100]
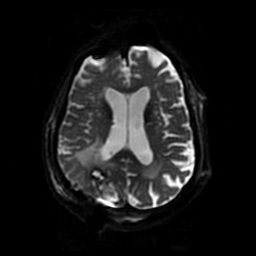
[im 80/100]
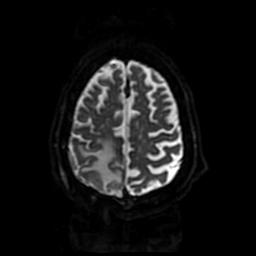
[im 100/100]
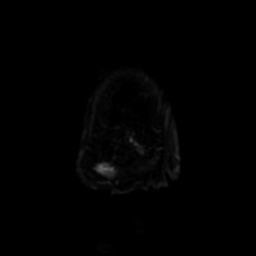

[Series 3: DWI · coronal · 4.0mm · 0.94mm/px · 4 of 74 slices shown (2 of 4)]
[im 1/74]
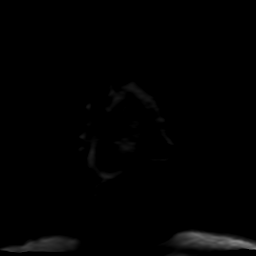
[im 25/74]
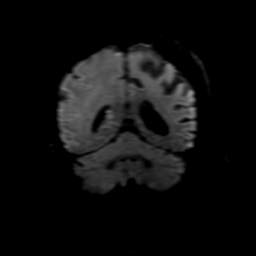
[im 49/74]
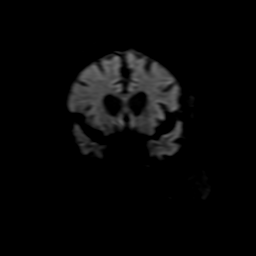
[im 74/74]
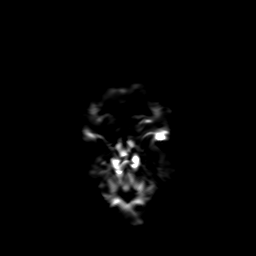

[Series 4: FLAIR · sagittal · 5.0mm · 0.23mm/px · 1 of 25 slices shown (1 of 2)]
[im 1/25]
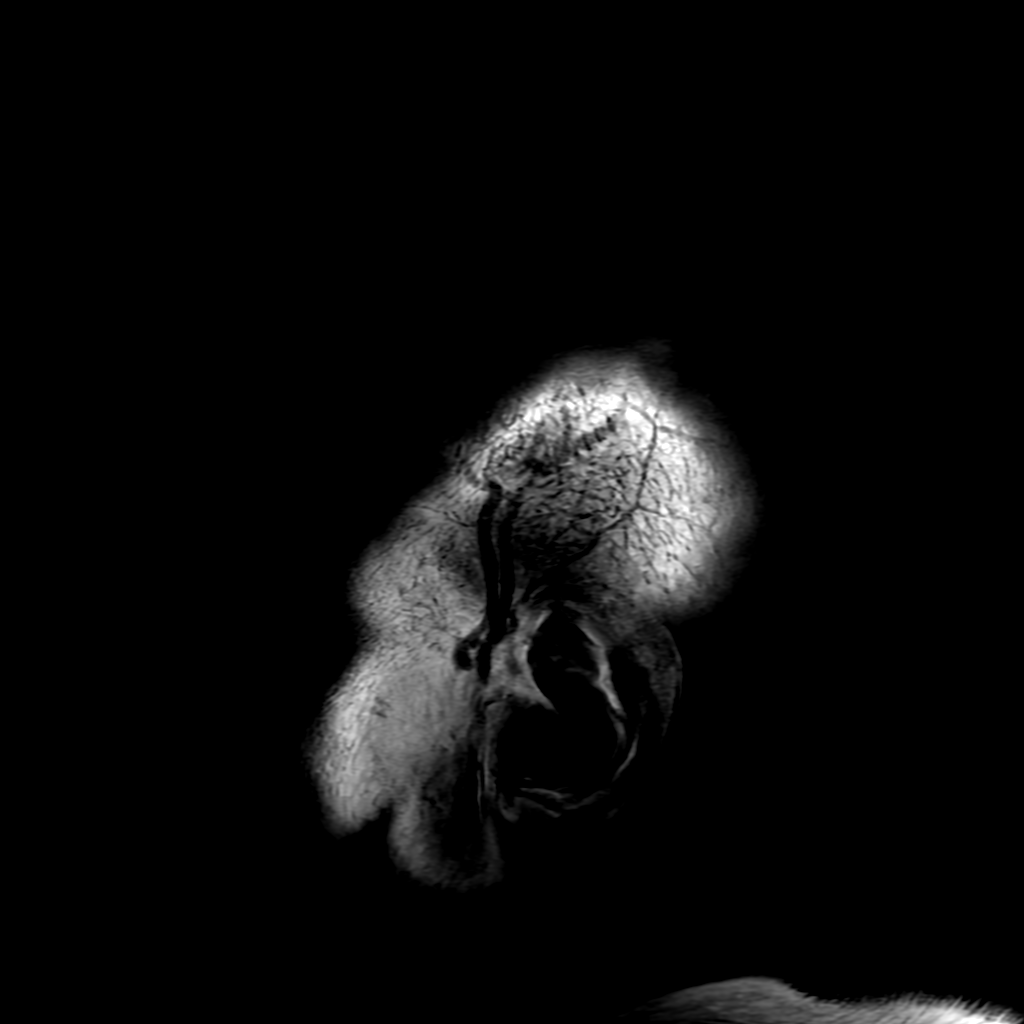

[Series 5: T2 · axial · 5.0mm · 0.23mm/px · 1 of 26 slices shown]
[im 1/26]
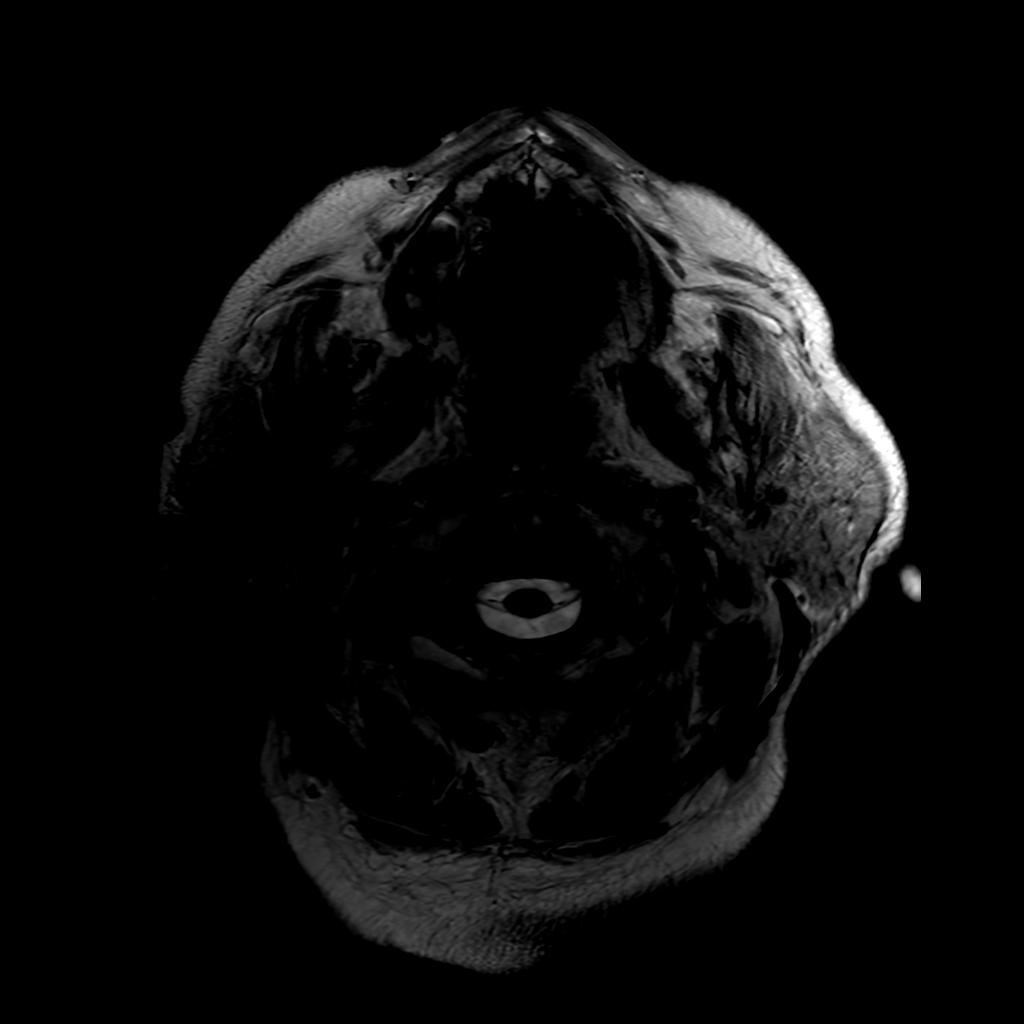

[Series 6: FLAIR · axial · 3.0mm · 0.45mm/px · 1 of 26 slices shown (2 of 2)]
[im 1/26]
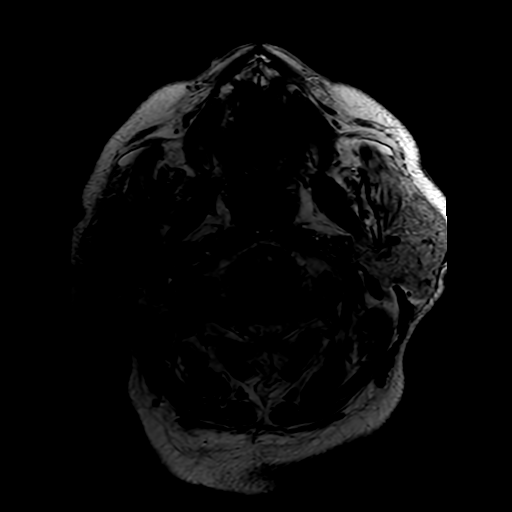

[Series 210: DWI · axial · 3.0mm · 0.94mm/px · z∈[-40,+104]mm · 6 of 100 slices shown (3 of 4)]
[im 1/100]
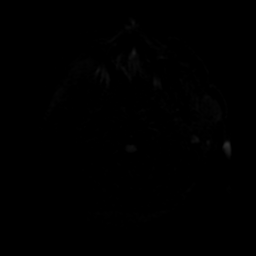
[im 20/100]
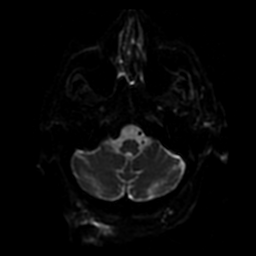
[im 40/100]
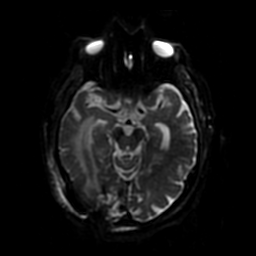
[im 60/100]
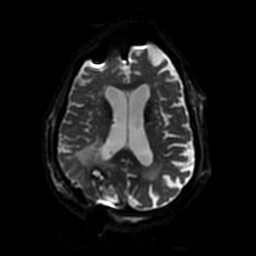
[im 80/100]
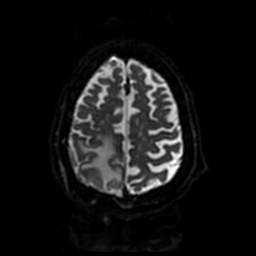
[im 100/100]
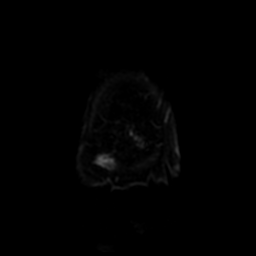

[Series 250: ADC · axial · 3.0mm · 0.94mm/px · z∈[-40,+104]mm · 3 of 50 slices shown (1 of 2)]
[im 1/50]
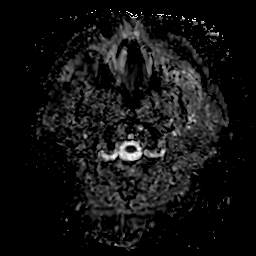
[im 25/50]
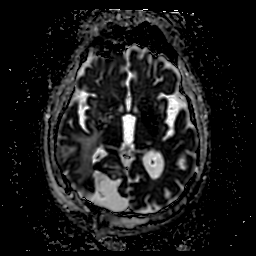
[im 50/50]
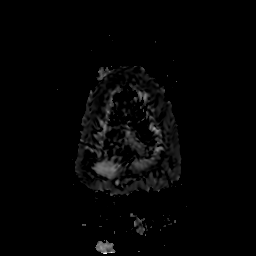

[Series 310: DWI · coronal · 4.0mm · 0.94mm/px · 2 of 37 slices shown (4 of 4)]
[im 1/37]
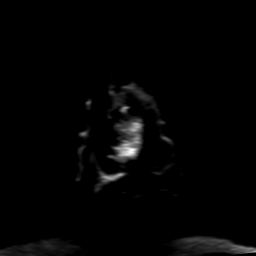
[im 37/37]
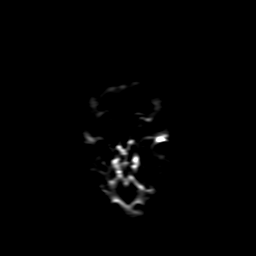

[Series 350: ADC · coronal · 4.0mm · 0.94mm/px · 2 of 37 slices shown (2 of 2)]
[im 1/37]
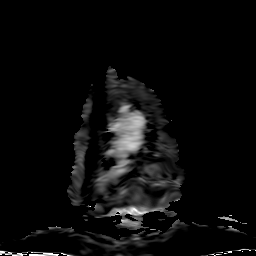
[im 37/37]
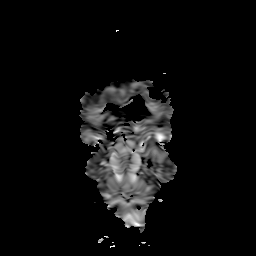

[26 of 48 positions shown; findings below may reference images not displayed]

FINDINGS: Brain: Postoperative changes from interval right parieto-occipital
craniotomy. Small subdural collection subjacent to the craniotomy
bone flap measures up to 5 mm in thickness without significant mass
effect. Superimposed postoperative pneumocephalus. Additional
postoperative pneumocephalus overlies the right frontal convexity
anteriorly. Previously seen hemorrhagic right occipital tumor has
been resected, with postoperative fluid, blood products, and
Gel-Foam seen within the resection cavity. Deep margin of the
resection cavity extends to occipital horn of the right lateral
ventricle, with adjacent trace intraventricular blood products.
Smooth postcontrast enhancement seen about the resection cavity
following contrast administration favored to in large part be
postoperative. No obvious residual tumor identified. No significant
infarct about the surgical site or other complication. Persistent
vasogenic edema seen throughout the posterior right cerebral
hemisphere, relatively similar as compared to preoperative exam at
this time. No significant midline shift. Basilar cisterns remain
patent. No hydrocephalus.

Remainder the brain is stable in appearance with associated atrophy
and chronic microvascular ischemic disease. No other infarct or
other acute intracranial abnormality. No other abnormal enhancement.

Vascular: Major intracranial vascular flow voids are maintained.
Normal flow voids seen within the major dural sinuses.

Skull and upper cervical spine: Craniocervical junction within
normal limits. Bone marrow signal intensity normal. Post craniotomy
changes at the right posterior scalp without adverse features.

Sinuses/Orbits: Globes and orbital soft tissues within normal
limits. Paranasal sinuses are largely clear. Trace left mastoid
effusion noted, of doubtful significance.

Other: None.
IMPRESSION: 1. Postoperative changes from interval right parieto-occipital
craniotomy for tumor resection. Relatively smooth enhancement about
the resection cavity favored to be postoperative in nature. No gross
residual tumor evident at this time.
2. Residual vasogenic edema throughout the posterior right cerebral
hemisphere, relatively similar as compared to preoperative exam.
3. Otherwise stable appearance of the brain, with no other new acute
intracranial abnormality.

## 2020-07-09 MED ORDER — LISINOPRIL 20 MG PO TABS
20.0000 mg | ORAL_TABLET | Freq: Every day | ORAL | Status: DC
  Administered 2020-07-09 – 2020-07-14 (×6): 20 mg via ORAL
  Filled 2020-07-09 (×6): qty 1

## 2020-07-09 MED ORDER — GADOBUTROL 1 MMOL/ML IV SOLN
7.5000 mL | Freq: Once | INTRAVENOUS | Status: AC | PRN
  Administered 2020-07-09: 7.5 mL via INTRAVENOUS

## 2020-07-09 MED ORDER — DIAZEPAM 5 MG PO TABS
5.0000 mg | ORAL_TABLET | Freq: Once | ORAL | Status: AC
  Administered 2020-07-09: 5 mg via ORAL
  Filled 2020-07-09: qty 1

## 2020-07-09 NOTE — Progress Notes (Signed)
He has appropriate soreness at the surgical site.  He has a left hemianopsia.  He is awake and alert moving all extremities well.  Will mobilize today.

## 2020-07-09 NOTE — Progress Notes (Signed)
Thomas Garcia  PPJ:[id number] DOB: 23-Jul-1940 DOA: 06/26/2020 PCP: Doree Albee, MD    Brief Narrative:  80yo with a hx of HTN, DM 2, and PAD who presented to the ER with severe generalized weakness and fatigue, nausea, loss of appetite, and scant hemoptysis. 1 month prior he presented to an ER with shortness of breath and 6 months of hemoptysis and was found to have small bilateral pulmonary emboli as well as a masslike consolidation in the right upper lobe with cavitation.  He refused admission at that time.  Upon his return to the ER for this admission a CT head revealed evidence of a right occipital mass.  3/29 IVC filter placed  Antimicrobials:  Rocephin 3/21 >3/25  DVT prophylaxis: Subcutaneous heparin  Consultants:  Pulmonary Neurosurgery  Subjective: Has some soreness over incision site. 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 Was treated with empiric antibiotic for possible postobstructive pneumonia - Pulmonary following - if develops gross hemoptysis will need IR directed embolization  Right occipital mass -suspected metastatic lung cancer Complicated by mild hemorrhage and vasogenic edema resulting in a 2 mm right-to-left shift - Neurosurgery following - continue decadron and empiric keppra - s/p stereotactic right occipital craniotomy for resection of mass - post op care per neurosurgery - follow up pathology  Pulmonary embolism Full anticoagulation on hold in setting of hemoptysis and bleeding from brain mass - no evidence of DVT on bilateral lower extremity venous duplex - began SQ heparin at prophy dose 3/25 - monitor closely for bleeding - IR placed a retrievable IVC filter 3/29  Chronic iron deficiency anemia Likely a consequence of his previously undiagnosed cancer - He did have some bleeding during surgery and was transfused 1 unit prbc - follow up hemglobin 10.3  PAD Status post left BKA - Plavix  necessarily on hold due to above - continue statin - followed by Dr. Carlis Abbott  HTN Blood pressure stable-adjust medical therapy and follow  Uncontrolled DM 2 w/ hyperglycemia A1c 6.4 - blood sugars stable, continue Lantus and novolog  GERD Well compensated  Hyponatremia Corrected  Code Status: FULL CODE Family Communication: No family present at time of exam, updated son, Ed, over the phone 4/1 Status is: Inpatient  Remains inpatient appropriate because:Inpatient level of care appropriate due to severity of illness   Dispo: The patient is from: Home              Anticipated d/c is to: unclear              Patient currently is not medically stable to d/c.   Difficult to place patient No     Objective: Blood pressure (!) 142/55, pulse (!) 56, temperature 97.8 F (36.6 C), temperature source Oral, resp. rate 15, height 5\' 9"  (1.753 m), weight 81.6 kg, SpO2 94 %.  Intake/Output Summary (Last 24 hours) at 07/09/2020 1426 Last data filed at 07/09/2020 1300 Gross per 24 hour  Intake 1881.84 ml  Output 1920 ml  Net -38.16 ml   Filed Weights   06/26/20 1856  Weight: 81.6 kg    Examination: General exam: Alert, awake, oriented x 3 HEENT: incision over right occipital skull Respiratory system: Clear to auscultation. Respiratory effort normal. Cardiovascular system:RRR. No murmurs, rubs, gallops. Gastrointestinal system: Abdomen is nondistended, soft and nontender. No organomegaly or masses felt. Normal bowel sounds heard. Central nervous system: Alert and oriented. No focal neurological deficits. Extremities: left BKA Skin: No rashes,  lesions or ulcers Psychiatry: Judgement and insight appear normal. Mood & affect appropriate.       CBC: Recent Labs  Lab 07/06/20 0811 07/08/20 0124 07/08/20 1042 07/09/20 0545  WBC 20.2* 24.0*  --  23.9*  HGB 9.6* 8.8* 10.9* 10.3*  HCT 32.3* 29.4* 32.0* 33.2*  MCV 76.0* 76.2*  --  77.4*  PLT 453* 333  --  408   Basic Metabolic  Panel: Recent Labs  Lab 07/07/20 1153 07/08/20 0124 07/08/20 1042 07/09/20 0545  NA 137 136 137 136  K 4.5 4.6 4.8 4.2  CL 105 106  --  107  CO2 27 26  --  25  GLUCOSE 302* 249*  --  116*  BUN 29* 30*  --  20  CREATININE 0.86 1.10  --  0.83  CALCIUM 8.5* 8.4*  --  7.7*   GFR: Estimated Creatinine Clearance: 74.5 mL/min (by C-G formula based on SCr of 0.83 mg/dL).  Liver Function Tests: No results for input(s): AST, ALT, ALKPHOS, BILITOT, PROT, ALBUMIN in the last 168 hours.  Coagulation Profile: Recent Labs  Lab 07/07/20 1807  INR 1.0    HbA1C: Hgb A1c MFr Bld  Date/Time Value Ref Range Status  06/27/2020 05:29 AM 6.4 (H) 4.8 - 5.6 % Final    Comment:    (NOTE) Pre diabetes:          5.7%-6.4%  Diabetes:              >6.4%  Glycemic control for   <7.0% adults with diabetes   04/14/2020 12:00 AM 5.6 <5.7 % of total Hgb Final    Comment:    For the purpose of screening for the presence of diabetes: . <5.7%       Consistent with the absence of diabetes 5.7-6.4%    Consistent with increased risk for diabetes             (prediabetes) > or =6.5%  Consistent with diabetes . This assay result is consistent with a decreased risk of diabetes. . Currently, no consensus exists regarding use of hemoglobin A1c for diagnosis of diabetes in children. . According to American Diabetes Association (ADA) guidelines, hemoglobin A1c <7.0% represents optimal control in non-pregnant diabetic patients. Different metrics may apply to specific patient populations.  Standards of Medical Care in Diabetes(ADA). .     CBG: Recent Labs  Lab 07/08/20 1151 07/08/20 1645 07/08/20 2153 07/09/20 0717 07/09/20 1128  GLUCAP 199* 214* 233* 98 103*    No results found for this or any previous visit (from the past 240 hour(s)).   Scheduled Meds: . atorvastatin  10 mg Oral q1800  . Chlorhexidine Gluconate Cloth  6 each Topical Daily  . dexamethasone  4 mg Oral Q6H  .  diazepam  5 mg Oral Once  . insulin aspart  0-20 Units Subcutaneous TID WC  . insulin aspart  0-5 Units Subcutaneous QHS  . insulin aspart  10 Units Subcutaneous TID WC  . insulin glargine  30 Units Subcutaneous BID  . levETIRAcetam  500 mg Oral BID  . lisinopril  20 mg Oral Daily  . pantoprazole  40 mg Oral Q0600  . traZODone  50 mg Oral QHS    . sodium chloride 75 mL/hr at 07/09/20 1200    LOS: 13 days   Kathie Dike, MD [facility] Office  [phone] Pager - Text Page per Shea Evans  If 7PM-7AM, please contact night-coverage per Amion 07/09/2020, 2:26 PM

## 2020-07-09 NOTE — Progress Notes (Signed)
NAME:  Thomas Garcia, MRN:  [id number], DOB:  [birth year], LOS: 42 ADMISSION DATE:  06/26/2020, CONSULTATION DATE:  06/27/2020 REFERRING MD:  Dr. Myna Hidalgo, CHIEF COMPLAINT:  Lung mass    History of Present Illness:  Thomas Garcia is a 80 y.o. 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 Of note patient was seen at this health system 1 month ago for cough and hemoptysis and was found to have small bilateral age-indeterminate PE's and a right upper lobe mass but unfortunately patient refused admission.   Since that ED visit patient has experienced worsened  generalized weakness, fatigue, nausea, hemoptysis, and loss of appetite. He reports a ~80lb weight loss over the span of a year. Additional he reports chills, vision changes, progressive weakness, and cough with hemoptysis. He reports a 80ppd smoking history.   Given lung mass with high suspicion for metastatic cancer PCCM was consulted for assistance in tissue sampling.    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 Admitted 3/21 . CCM signed off 3/25 . 3/29 IVC filter placement . OR 4/1 for R crani and resection, extubated but will come to the ICU, ccm re-consulted   Interim History / Subjective:   Stated did not sleep well last night, no new complaints  Objective   Blood pressure (!) 143/58, pulse (!) 50, temperature 98.4 F (36.9 C), temperature source Oral, resp. rate 15, height 5\' 9"  (1.753 m), weight 81.6 kg, SpO2 100 %.    FiO2 (%):  [21 %] 21 %   Intake/Output Summary (Last 24 hours) at 07/09/2020 1106 Last data filed at 07/09/2020 1000 Gross per 24 hour  Intake 2566.89 ml  Output 1920 ml  Net 646.89 ml   Filed Weights   06/26/20 1856  Weight: 81.6 kg    Examination: General: Chronically  ill-appearing elderly male, lying on the bed HEENT: R crani surgical site with fresh dried blood. Pink tacky mm. Trachea midline, no JVD Neuro: Alert, awake, following commands, moving all 4 extremities CV: bradycardic, regular. Cap refill brisk, 2+ radial pulse PULM:   Symmetrical chest expansion, even and unlabored respirations. Diminished bibasilar sounds GI: Obese soft round non tender extremities:  L BKa. No acute deformity. No cyanosis or clubbing.  Skin: pale, c/d/cool no rash  Labs/imaging that I havepersonally reviewed    CT C/A/P> large lung mass with likely lymphangitic spread, mediastinal adenopathy.  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, s/p crani and resection 4/1  RUL lung mass, suspicious for metastatic lung ca post op per NSGY Continue decadron, keppra pulmonary hygiene, mobility as able if massive hemoptysis, would need IR for embolization of mass   PE s/p IVC filter placement 3/29 Hemoptysis, improved (could be from PE vs lung cancer)  Holding anticoagulation due to hemoptysis and recent surgery  HTN Continue PRN labetalol  DM with acute Hyperglycemia On steroids for brain tumor with vasogenic edema SSI + HS SSI + 10units novolog TID + BID lantus Monitor fingerstick with goal 140-180  Chronic anemia with ABLA -EBL 550 ml 4/1 in OR, got 1PRBC -trend PRN, dont feel like he absolutely needs post op cbc  Best practice (evaluated daily)  Diet -regular diet VTE ppx- on hold for OR 4/1, has IVC filter in setting of PE GI ppx- protonix (on steroids) VAP - n/a PAD- n/a Code Status- Full Dispo-Per primary team  Labs   CBC: Recent Labs  Lab 07/05/20 0455 07/06/20 0811 07/08/20 0124 07/08/20 1042 07/09/20 0545  WBC 19.3* 20.2* 24.0*  --  23.9*  HGB 9.2* 9.6* 8.8* 10.9* 10.3*  HCT 29.7* 32.3*  29.4* 32.0* 33.2*  MCV 74.8* 76.0* 76.2*  --  77.4*  PLT 452* 453* 333  --  484    Basic Metabolic Panel: Recent Labs  Lab 07/05/20 0455 07/07/20 1153 07/08/20 0124 07/08/20 1042 07/09/20 0545  NA 136 137 136 137 136  K 4.7 4.5 4.6 4.8 4.2  CL 105 105 106  --  107  CO2 27 27 26   --  25  GLUCOSE 239* 302* 249*  --  116*  BUN 25* 29* 30*  --  20  CREATININE 0.77 0.86 1.10  --  0.83  CALCIUM 8.4* 8.5* 8.4*  --  7.7*     Jacky Kindle MD [facility] See Amion for pager If no response to pager, please call [phone] until 7pm After 7pm, Please call E-link [phone]

## 2020-07-10 DIAGNOSIS — I619 Nontraumatic intracerebral hemorrhage, unspecified: Secondary | ICD-10-CM | POA: Diagnosis not present

## 2020-07-10 DIAGNOSIS — J984 Other disorders of lung: Secondary | ICD-10-CM | POA: Diagnosis not present

## 2020-07-10 DIAGNOSIS — C719 Malignant neoplasm of brain, unspecified: Secondary | ICD-10-CM | POA: Diagnosis not present

## 2020-07-10 LAB — GLUCOSE, CAPILLARY
Glucose-Capillary: 112 mg/dL — ABNORMAL HIGH (ref 70–99)
Glucose-Capillary: 119 mg/dL — ABNORMAL HIGH (ref 70–99)
Glucose-Capillary: 130 mg/dL — ABNORMAL HIGH (ref 70–99)

## 2020-07-10 MED FILL — Thrombin For Soln 5000 Unit: CUTANEOUS | Qty: 5000 | Status: AC

## 2020-07-10 MED FILL — Thrombin For Soln 20000 Unit: CUTANEOUS | Qty: 1 | Status: AC

## 2020-07-10 NOTE — Progress Notes (Addendum)
NEUROSURGERY PROGRESS NOTE  Doing well. Complains of appropriate headaches Left hemianopsia Incision CDI  Temp:  [97.9 F (36.6 C)-99 F (37.2 C)] 98.4 F (36.9 C) (04/03 0716) Pulse Rate:  [50-70] 50 (04/03 0716) Resp:  [12-20] 16 (04/03 0716) BP: (121-170)/(49-96) 169/58 (04/03 0716) SpO2:  [93 %-100 %] 98 % (04/03 0716)  Plan: Therapies today, pain management and supportive care Appreciate internal medicines help from a medical standpoint.  Eleonore Chiquito, NP 07/10/2020 11:24 AM

## 2020-07-10 NOTE — Progress Notes (Signed)
Thomas Garcia  ZOX:[id number] DOB: 1940/04/24 DOA: 06/26/2020 PCP: Doree Albee, MD    Brief Narrative:  80yo with a hx of HTN, DM 2, and PAD who presented to the ER with severe generalized weakness and fatigue, nausea, loss of appetite, and scant hemoptysis. 1 month prior he presented to an ER with shortness of breath and 6 months of hemoptysis and was found to have small bilateral pulmonary emboli as well as a masslike consolidation in the right upper lobe with cavitation.  He refused admission at that time.  Upon his return to the ER for this admission a CT head revealed evidence of a right occipital mass.  3/29 IVC filter placed  Antimicrobials:  Rocephin 3/21 >3/25  DVT prophylaxis: Subcutaneous heparin  Consultants:  Pulmonary Neurosurgery  Subjective: Has some soreness over incision site. 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 Was treated with empiric antibiotic for possible postobstructive pneumonia - Pulmonary following - if develops gross hemoptysis will need IR directed embolization  Right occipital mass -suspected metastatic lung cancer Complicated by mild hemorrhage and vasogenic edema resulting in a 2 mm right-to-left shift - Neurosurgery following - continue decadron and empiric keppra - s/p stereotactic right occipital craniotomy for resection of mass - post op care per neurosurgery - follow up pathology- will need to get oncology input  Pulmonary embolism Full anticoagulation on hold in setting of hemoptysis and bleeding from brain mass - no evidence of DVT on bilateral lower extremity venous duplex - began SQ heparin at prophy dose 3/25 - monitor closely for bleeding - IR placed a retrievable IVC filter 3/29 - Currently breathing comfortably on room air  Chronic iron deficiency anemia Likely a consequence of his previously undiagnosed cancer - He did have some bleeding during surgery and was transfused 1  unit prbc - follow up hemglobin 10.3  PAD Status post left BKA - Plavix necessarily on hold due to above - continue statin - followed by Dr. Carlis Abbott - defer to neurosurgery as to when it is reasonable to restart plavix  HTN Blood pressure stable-adjust medical therapy and follow  Uncontrolled DM 2 w/ hyperglycemia A1c 6.4 - blood sugars stable, continue Lantus and novolog  GERD Well compensated  Hyponatremia Corrected  Generalized weakness -PT/OT  Code Status: FULL CODE Family Communication: No family present at time of exam, updated son, Ed, over the phone 4/1 Status is: Inpatient  Remains inpatient appropriate because:Inpatient level of care appropriate due to severity of illness   Dispo: The patient is from: Home              Anticipated d/c is to: unclear              Patient currently is not medically stable to d/c.   Difficult to place patient No     Objective: Blood pressure (!) 169/58, pulse (!) 50, temperature 98.4 F (36.9 C), temperature source Oral, resp. rate 16, height 5\' 9"  (1.753 m), weight 81.6 kg, SpO2 98 %.  Intake/Output Summary (Last 24 hours) at 07/10/2020 1257 Last data filed at 07/10/2020 0800 Gross per 24 hour  Intake 698.77 ml  Output 1026 ml  Net -327.23 ml   Filed Weights   06/26/20 1856  Weight: 81.6 kg    Examination: General exam: Alert, awake, oriented x 3 HEENT: incision over right occipital skull Respiratory system: Clear to auscultation. Respiratory effort normal. Cardiovascular system:RRR. No murmurs, rubs, gallops. Gastrointestinal system: Abdomen  is nondistended, soft and nontender. No organomegaly or masses felt. Normal bowel sounds heard. Central nervous system: Alert and oriented. No focal neurological deficits. Extremities: left BKA Skin: No rashes, lesions or ulcers Psychiatry: Judgement and insight appear normal. Mood & affect appropriate.       CBC: Recent Labs  Lab 07/06/20 0811 07/08/20 0124 07/08/20 1042  07/09/20 0545  WBC 20.2* 24.0*  --  23.9*  HGB 9.6* 8.8* 10.9* 10.3*  HCT 32.3* 29.4* 32.0* 33.2*  MCV 76.0* 76.2*  --  77.4*  PLT 453* 333  --  128   Basic Metabolic Panel: Recent Labs  Lab 07/07/20 1153 07/08/20 0124 07/08/20 1042 07/09/20 0545  NA 137 136 137 136  K 4.5 4.6 4.8 4.2  CL 105 106  --  107  CO2 27 26  --  25  GLUCOSE 302* 249*  --  116*  BUN 29* 30*  --  20  CREATININE 0.86 1.10  --  0.83  CALCIUM 8.5* 8.4*  --  7.7*   GFR: Estimated Creatinine Clearance: 74.5 mL/min (by C-G formula based on SCr of 0.83 mg/dL).  Liver Function Tests: No results for input(s): AST, ALT, ALKPHOS, BILITOT, PROT, ALBUMIN in the last 168 hours.  Coagulation Profile: Recent Labs  Lab 07/07/20 1807  INR 1.0    HbA1C: Hgb A1c MFr Bld  Date/Time Value Ref Range Status  06/27/2020 05:29 AM 6.4 (H) 4.8 - 5.6 % Final    Comment:    (NOTE) Pre diabetes:          5.7%-6.4%  Diabetes:              >6.4%  Glycemic control for   <7.0% adults with diabetes   04/14/2020 12:00 AM 5.6 <5.7 % of total Hgb Final    Comment:    For the purpose of screening for the presence of diabetes: . <5.7%       Consistent with the absence of diabetes 5.7-6.4%    Consistent with increased risk for diabetes             (prediabetes) > or =6.5%  Consistent with diabetes . This assay result is consistent with a decreased risk of diabetes. . Currently, no consensus exists regarding use of hemoglobin A1c for diagnosis of diabetes in children. . According to American Diabetes Association (ADA) guidelines, hemoglobin A1c <7.0% represents optimal control in non-pregnant diabetic patients. Different metrics may apply to specific patient populations.  Standards of Medical Care in Diabetes(ADA). .     CBG: Recent Labs  Lab 07/09/20 0717 07/09/20 1128 07/09/20 1846 07/09/20 2130 07/10/20 0827  GLUCAP 98 103* 115* 140* 112*    No results found for this or any previous visit (from  the past 240 hour(s)).   Scheduled Meds: . atorvastatin  10 mg Oral q1800  . Chlorhexidine Gluconate Cloth  6 each Topical Daily  . dexamethasone  4 mg Oral Q6H  . insulin aspart  0-20 Units Subcutaneous TID WC  . insulin aspart  0-5 Units Subcutaneous QHS  . insulin aspart  10 Units Subcutaneous TID WC  . insulin glargine  30 Units Subcutaneous BID  . levETIRAcetam  500 mg Oral BID  . lisinopril  20 mg Oral Daily  . pantoprazole  40 mg Oral Q0600  . traZODone  50 mg Oral QHS      LOS: 14 days   Kathie Dike, MD [facility] Office  [phone] Pager - Text Page per Amion  If 7PM-7AM,  please contact night-coverage per Amion 07/10/2020, 12:57 PM

## 2020-07-10 NOTE — Evaluation (Signed)
Physical Therapy Re-Evaluation Patient Details Name: Thomas Garcia MRN: [id number] DOB: 07/27/1940 Today's Date: 07/10/2020   History of Present Illness  The pt is a 80 yo male presenting 3/20 with c/o general weakness, fatigue, nausea, and loss of appetite. Upon work-up, pt found to have right occipital cortex with hemorrhage with 65mm midline shift. MRI on 3/23 showed "large multilobulated hemorrhagic lesion in right occipital lobe". S/p S/p R occipital craniotomy for resection of mass 4/1. PMH includes: lung cancer, HTN, DM II, PAD, L BKA.    Clinical Impression  Pt presents with condition above and deficits mentioned below, see PT Problem List. Pt now s/p R occipital craniotomy for resection of mass 4/1. Pt with no significant changes following surgery compared to prior presentation as he continues to perform bed mobility with mod I, lateral scoot transfers with min guard, and transfer sit to partial stand with min guard-A. However, he does continue to report "blurry" vision with apparent L hemianopia. Pt with decreased activity tolerance and would benefit from further acute PT services to maximize safety and independence with all functional mobility and prevent decline prior to return home. Current recommendations remain appropriate.    Follow Up Recommendations No PT follow up    Equipment Recommendations  None recommended by PT (pt well equipped)    Recommendations for Other Services       Precautions / Restrictions Precautions Precautions: Fall Precaution Comments: L BKA at baseline Restrictions Weight Bearing Restrictions: No      Mobility  Bed Mobility Overal bed mobility: Modified Independent Bed Mobility: Supine to Sit     Supine to sit: Modified independent (Device/Increase time)     General bed mobility comments: Pt able to come to long-sitting and transition to sit EOB safely through scooting inferiorly down to end of bed to use end of bed rail to pull on to pivot  buttocks to side    Transfers Overall transfer level: Needs assistance Equipment used: None Transfers: Lateral/Scoot Transfers;Sit to/from Stand Sit to Stand: Min guard;Min assist        Lateral/Scoot Transfers: Min guard General transfer comment: Lateral scoot transfers to R 1x bed > recliner with proximal arm rest of recliner dropped and pt either pulling on bed rails or distal chair armrest to scoot laterally between surfaces. Pt needing several scoot reps to complete full transfer. Pt cued to pivot to an angle prior to clearing surfaces and to get majority of buttocks on target surface for safety purposes if unable to fully clear buttocks. Sit to stand 5x from recliner with use of arm rests, pt initially anxious needing minA but progressed to min guard. Pt unable to achieve full hip extension due to anxiety with removing bil UE support from chair and L arm being posteriorly placed compared to R majority of time.  Ambulation/Gait             General Gait Details: deferred, pt reports WC level at baseline  Stairs            Wheelchair Mobility    Modified Rankin (Stroke Patients Only)       Balance Overall balance assessment: Mild deficits observed, not formally tested                                           Pertinent Vitals/Pain Pain Assessment: Faces Faces Pain Scale: Hurts a little bit  Pain Location: buttocks Pain Descriptors / Indicators: Other (Comment) ("raw") Pain Intervention(s): Limited activity within patient's tolerance;Monitored during session;Repositioned    Home Living Family/patient expects to be discharged to:: Private residence Living Arrangements: Other relatives (Brother with COPD) Available Help at Discharge: Family;Available PRN/intermittently Type of Home: Mobile home Home Access: Ramped entrance     Home Layout: One level Home Equipment: Walker - 2 wheels;Grab bars - toilet;Toilet riser;Bedside commode;Wheelchair -  manual      Prior Function Level of Independence: Independent with assistive device(s)         Comments: Stand pivot to w/c     Hand Dominance   Dominant Hand: Right    Extremity/Trunk Assessment   Upper Extremity Assessment Upper Extremity Assessment: LUE deficits/detail LUE Deficits / Details: Pt reporting L arm being a little painful and thus weak this date    Lower Extremity Assessment Lower Extremity Assessment: LLE deficits/detail (impaired sensation in bil thighs at baseline; MMT scores of 4+ to 5 bil symmetrically) LLE Deficits / Details: L BKA at baseline    Cervical / Trunk Assessment Cervical / Trunk Assessment: Kyphotic  Communication   Communication: No difficulties  Cognition Arousal/Alertness: Awake/alert Behavior During Therapy: WFL for tasks assessed/performed;Anxious Overall Cognitive Status: No family/caregiver present to determine baseline cognitive functioning Area of Impairment: Attention;Safety/judgement;Awareness;Problem solving                   Current Attention Level: Sustained   Following Commands: Follows one step commands with increased time Safety/Judgement: Decreased awareness of safety;Decreased awareness of deficits Awareness: Emergent Problem Solving: Difficulty sequencing;Requires verbal cues;Requires tactile cues;Slow processing;Decreased initiation General Comments: Pt needing redirection at times and increased time to process and respond to cues. Pt with comprehending goal of cues at times.      General Comments General comments (skin integrity, edema, etc.): Pt reporting he lost his HEP handout when he changed rooms. Pt reports loss of vision in L peripheral region and improved vision when L eye covered, possible L hemianopia due to way pt described seeing part but not entire L side.    Exercises     Assessment/Plan    PT Assessment Patient needs continued PT services  PT Problem List Decreased activity  tolerance;Decreased balance;Decreased mobility       PT Treatment Interventions DME instruction;Gait training;Stair training;Functional mobility training;Therapeutic activities;Therapeutic exercise;Balance training;Patient/family education;Neuromuscular re-education    PT Goals (Current goals can be found in the Care Plan section)  Acute Rehab PT Goals Patient Stated Goal: return home PT Goal Formulation: With patient Time For Goal Achievement: 07/22/20 Potential to Achieve Goals: Good    Frequency Min 3X/week   Barriers to discharge        Co-evaluation               AM-PAC PT "6 Clicks" Mobility  Outcome Measure Help needed turning from your back to your side while in a flat bed without using bedrails?: None Help needed moving from lying on your back to sitting on the side of a flat bed without using bedrails?: None Help needed moving to and from a bed to a chair (including a wheelchair)?: A Little Help needed standing up from a chair using your arms (e.g., wheelchair or bedside chair)?: A Little Help needed to walk in hospital room?: Total Help needed climbing 3-5 steps with a railing? : Total 6 Click Score: 16    End of Session Equipment Utilized During Treatment: Gait belt Activity Tolerance: Patient tolerated treatment well  Patient left: in chair;with call bell/phone within reach;with chair alarm set Nurse Communication: Mobility status PT Visit Diagnosis: Other abnormalities of gait and mobility (R26.89);Unsteadiness on feet (R26.81)    Time: 7460-0298 PT Time Calculation (min) (ACUTE ONLY): 40 min   Charges:   PT Evaluation $PT Re-evaluation: 1 Re-eval PT Treatments $Therapeutic Activity: 23-37 mins        Moishe Spice, PT, DPT Acute Rehabilitation Services  Pager: [phone] Office: [place] 07/10/2020, 4:10 PM

## 2020-07-11 ENCOUNTER — Encounter (HOSPITAL_COMMUNITY): Payer: Self-pay | Admitting: Neurological Surgery

## 2020-07-11 ENCOUNTER — Inpatient Hospital Stay: Payer: Medicare HMO | Attending: Neurological Surgery

## 2020-07-11 DIAGNOSIS — R918 Other nonspecific abnormal finding of lung field: Secondary | ICD-10-CM | POA: Diagnosis not present

## 2020-07-11 DIAGNOSIS — G9389 Other specified disorders of brain: Secondary | ICD-10-CM | POA: Diagnosis not present

## 2020-07-11 DIAGNOSIS — D72829 Elevated white blood cell count, unspecified: Secondary | ICD-10-CM | POA: Diagnosis not present

## 2020-07-11 DIAGNOSIS — D649 Anemia, unspecified: Secondary | ICD-10-CM

## 2020-07-11 DIAGNOSIS — K219 Gastro-esophageal reflux disease without esophagitis: Secondary | ICD-10-CM

## 2020-07-11 LAB — POCT I-STAT 7, (LYTES, BLD GAS, ICA,H+H)
Acid-Base Excess: 0 mmol/L (ref 0.0–2.0)
Bicarbonate: 25.7 mmol/L (ref 20.0–28.0)
Calcium, Ion: 1.17 mmol/L (ref 1.15–1.40)
HCT: 24 % — ABNORMAL LOW (ref 39.0–52.0)
Hemoglobin: 8.2 g/dL — ABNORMAL LOW (ref 13.0–17.0)
O2 Saturation: 100 %
Patient temperature: 35.1
Potassium: 4.4 mmol/L (ref 3.5–5.1)
Sodium: 136 mmol/L (ref 135–145)
TCO2: 27 mmol/L (ref 22–32)
pCO2 arterial: 44 mmHg (ref 32.0–48.0)
pH, Arterial: 7.366 (ref 7.350–7.450)
pO2, Arterial: 261 mmHg — ABNORMAL HIGH (ref 83.0–108.0)

## 2020-07-11 LAB — GLUCOSE, CAPILLARY
Glucose-Capillary: 79 mg/dL (ref 70–99)
Glucose-Capillary: 78 mg/dL (ref 70–99)
Glucose-Capillary: 102 mg/dL — ABNORMAL HIGH (ref 70–99)
Glucose-Capillary: 192 mg/dL — ABNORMAL HIGH (ref 70–99)
Glucose-Capillary: 203 mg/dL — ABNORMAL HIGH (ref 70–99)
Glucose-Capillary: 124 mg/dL — ABNORMAL HIGH (ref 70–99)

## 2020-07-11 MED ORDER — INSULIN GLARGINE 100 UNIT/ML ~~LOC~~ SOLN
25.0000 [IU] | Freq: Two times a day (BID) | SUBCUTANEOUS | Status: DC
  Administered 2020-07-11: 25 [IU] via SUBCUTANEOUS
  Filled 2020-07-11 (×3): qty 0.25

## 2020-07-11 MED ORDER — HEPARIN SODIUM (PORCINE) 5000 UNIT/ML IJ SOLN
5000.0000 [IU] | Freq: Two times a day (BID) | INTRAMUSCULAR | Status: DC
  Administered 2020-07-11 – 2020-07-12 (×3): 5000 [IU] via SUBCUTANEOUS
  Filled 2020-07-11 (×3): qty 1

## 2020-07-11 MED ORDER — INSULIN ASPART 100 UNIT/ML ~~LOC~~ SOLN
7.0000 [IU] | Freq: Three times a day (TID) | SUBCUTANEOUS | Status: DC
  Administered 2020-07-11: 7 [IU] via SUBCUTANEOUS

## 2020-07-11 MED ORDER — DEXAMETHASONE 4 MG PO TABS
4.0000 mg | ORAL_TABLET | Freq: Two times a day (BID) | ORAL | Status: DC
  Administered 2020-07-11 – 2020-07-12 (×2): 4 mg via ORAL
  Filled 2020-07-11 (×2): qty 1

## 2020-07-11 NOTE — Progress Notes (Signed)
   Providing Compassionate, Quality Care - Together  NEUROSURGERY PROGRESS NOTE   S: No issues overnight. Doing well  O: EXAM:  BP (!) 162/54 (BP Location: Right Arm)   Pulse 60   Temp 98 F (36.7 C) (Oral)   Resp 16   Ht 5\' 9"  (1.753 m)   Wt 81.6 kg   SpO2 100%   BMI 26.58 kg/m   Awake, alert, oriented x3 Speech appropriate FCx4 Face symmetric  PERRL EOMI, L hemianopsia 5/5 BUE/BLE  Incision c/d/i  ASSESSMENT:  80 y.o. male with  1. Right occipital tumor, prelim metastatic  -s/p R crani for resection 07/08/2020  PLAN: -rehab pending -dex taper over 1 week -pain control     Thank you for allowing me to participate in this patient's care.  Please do not hesitate to call with questions or concerns.   Elwin Sleight, [facility] Cell: [phone]

## 2020-07-11 NOTE — Progress Notes (Signed)
Occupational Therapy Treatment Patient Details Name: Thomas Garcia MRN: [id number] DOB: Aug 23, 1940 Today's Date: 07/11/2020    History of present illness 80 yo male presenting 3/20 with c/o general weakness, fatigue, nausea, and loss of appetite. Upon work-up, pt found to have right occipital cortex with hemorrhage with 39mm midline shift. MRI on 3/23 showed "large multilobulated hemorrhagic lesion in right occipital lobe". S/p R occipital craniotomy for resection of mass 4/1. PMH includes: lung cancer, HTN, DM II, PAD, L BKA.   OT comments  Pt progressing towards established OT goals. Pt presenting with impaired cognition and vision; noting pt also with change in demeanor since last OT session. Pt keeping eye closed intermittently during session; reports he is sensitive to light and poor vision in left visual field. Pt performing lateral scoot to drop arm recliner with Min Guard A. Continue to recommend dc to home with HHOT to address vision and cognition impacting functional performance, independence, and safety.    Follow Up Recommendations  Home health OT;Supervision/Assistance - 24 hour    Equipment Recommendations  None recommended by OT    Recommendations for Other Services PT consult    Precautions / Restrictions Precautions Precautions: Fall Precaution Comments: L BKA at baseline Restrictions Weight Bearing Restrictions: No       Mobility Bed Mobility Overal bed mobility: Needs Assistance Bed Mobility: Supine to Sit     Supine to sit: Supervision     General bed mobility comments: Supervision for safety    Transfers Overall transfer level: Needs assistance Equipment used: None Transfers: Lateral/Scoot Transfers;Sit to/from Stand          Lateral/Scoot Transfers: Min guard General transfer comment: MIn Guard A for safety to lateral scoot to R to drop arm recliner    Balance Overall balance assessment: Mild deficits observed, not formally tested                                          ADL either performed or assessed with clinical judgement   ADL Overall ADL's : Needs assistance/impaired Eating/Feeding: Set up;Sitting   Grooming: Supervision/safety;Set up;Sitting;Wash/dry face;Wash/dry hands;Bed level   Upper Body Bathing: Min guard;Sitting   Lower Body Bathing: Minimal assistance;Bed level   Upper Body Dressing : Min guard;Sitting   Lower Body Dressing: Minimal assistance;Bed level   Toilet Transfer: Min guard;Transfer board (lateral scoot to drop arm recliner) Toilet Transfer Details (indicate cue type and reason): Min Guard A for safety Toileting- Clothing Manipulation and Hygiene: Moderate assistance;Bed level Toileting - Clothing Manipulation Details (indicate cue type and reason): Mod A for cleaning up at bed level after bowl incontinence. Pt then able to lay back in supine and reach down to clean peri area     Functional mobility during ADLs: Min guard (lateral scoot) General ADL Comments: Pt performing peri care at bed level and then lateral scoot to recliner. Pt demonstrating transfers at [place] level. Presenting with decreased cognition and vision compared to prior OT session. PT also with more awareness that he "has a deficit"     Vision Baseline Vision/History: Wears glasses Wears Glasses: At all times Patient Visual Report: No change from baseline Vision Assessment?: Vision impaired- to be further tested in functional context Additional Comments: Pt continues to reports visual deficits in left visual field. ABle to perform tracking task in all four quad but reports fatigue and strain with tracking  to left. Initatly, pt unable to track to left and stated "oh I lost it."   Perception     Praxis      Cognition Arousal/Alertness: Awake/alert Behavior During Therapy: WFL for tasks assessed/performed (calm and appologetic) Overall Cognitive Status: Impaired/Different from baseline Area of  Impairment: Attention;Safety/judgement;Awareness;Problem solving;Memory                   Current Attention Level: Sustained Memory: Decreased short-term memory (Reporting he has forgotten his home phone number and was able to remember it consistently prior to sx. states in a calm way, "I think the sx messed with my memory.") Following Commands: Follows one step commands with increased time Safety/Judgement: Decreased awareness of safety;Decreased awareness of deficits Awareness: Emergent Problem Solving: Requires verbal cues;Requires tactile cues;Slow processing General Comments: Upon arrival, pt presenting with a calm demeanor and appologetic for any rude before to therapy in past sessions; reassured pt he is fine. Perseberating on topic of being rude in the past and possible rudeness in the future. Pt also intermittently closing his eyes but able to note that he is closing his eyes and then open then with cues. Pt reporting new memory deficits including not recalling his home number which he had memorized prior to sx.        Exercises     Shoulder Instructions       General Comments      Pertinent Vitals/ Pain       Pain Assessment: Faces Faces Pain Scale: Hurts a little bit Pain Location: buttocks Pain Descriptors / Indicators: Other (Comment) ("raw") Pain Intervention(s): Monitored during session;Limited activity within patient's tolerance;Repositioned  Home Living Family/patient expects to be discharged to:: Private residence Living Arrangements: Other relatives (Brother with COPD) Available Help at Discharge: Family;Available PRN/intermittently Type of Home: Mobile home Home Access: Ramped entrance     Home Layout: One level     Bathroom Shower/Tub: Teacher, early years/pre: Standard     Home Equipment: Walker - 2 wheels;Grab bars - toilet;Toilet riser;Bedside commode;Wheelchair - manual          Prior Functioning/Environment Level of  Independence: Independent with assistive device(s)        Comments: Stand pivot to w/c   Frequency  Min 2X/week        Progress Toward Goals  OT Goals(current goals can now be found in the care plan section)     Acute Rehab OT Goals Patient Stated Goal: return home OT Goal Formulation: With patient Time For Goal Achievement: 07/15/20 Potential to Achieve Goals: Good  Plan      Co-evaluation    PT/OT/SLP Co-Evaluation/Treatment: Yes            AM-PAC OT "6 Clicks" Daily Activity     Outcome Measure   Help from another person eating meals?: None Help from another person taking care of personal grooming?: A Little Help from another person toileting, which includes using toliet, bedpan, or urinal?: A Little Help from another person bathing (including washing, rinsing, drying)?: A Little Help from another person to put on and taking off regular upper body clothing?: None Help from another person to put on and taking off regular lower body clothing?: A Little 6 Click Score: 20    End of Session    OT Visit Diagnosis: Other abnormalities of gait and mobility (R26.89);Unsteadiness on feet (R26.81);Muscle weakness (generalized) (M62.81)   Activity Tolerance Patient tolerated treatment well   Patient Left in chair;with call bell/phone  within reach;with chair alarm set   Nurse Communication Mobility status        Time: 0802-0829 OT Time Calculation (min): 27 min  Charges: OT General Charges $OT Visit: 1 Visit OT Evaluation $OT Re-eval: 1 Re-eval OT Treatments $Self Care/Home Management : 8-22 mins  Ingra Rother MSOT, OTR/L Acute Rehab Pager: [phone] Office: [place] 07/11/2020, 10:23 AM

## 2020-07-11 NOTE — Progress Notes (Signed)
Thomas Garcia  IRW:[id number] DOB: 01/05/1941 DOA: 06/26/2020 PCP: Doree Albee, MD    Brief Narrative:  80yo with a hx of HTN, DM 2, and PAD who presented to the ER with severe generalized weakness and fatigue, nausea, loss of appetite, and scant hemoptysis. 1 month prior he presented to an ER with shortness of breath and 6 months of hemoptysis and was found to have small bilateral pulmonary emboli as well as a masslike consolidation in the right upper lobe with cavitation.  He refused admission at that time.  Upon his return to the ER for this admission a CT head revealed evidence of a right occipital mass.  3/29 IVC filter placed 4/1 right occipital craniotomy for resection of mass  Antimicrobials:  Rocephin 3/21 >3/25  DVT prophylaxis: Subcutaneous heparin  Consultants:  Pulmonary Neurosurgery  Subjective: Has some soreness over incision site. 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 Was treated with empiric antibiotic for possible postobstructive pneumonia - Pulmonary following - if develops gross hemoptysis will need IR directed embolization  Right occipital mass -suspected metastatic lung cancer Complicated by mild hemorrhage and vasogenic edema resulting in a 2 mm right-to-left shift - Neurosurgery following - continue decadron and empiric keppra - s/p stereotactic right occipital craniotomy for resection of mass - post op care per neurosurgery - follow up pathology-oncology consulted  Pulmonary embolism Full anticoagulation on hold in setting of hemoptysis and bleeding from brain mass - no evidence of DVT on bilateral lower extremity venous duplex - began SQ heparin at prophy dose 3/25 - monitor closely for bleeding - IR placed a retrievable IVC filter 3/29 - Currently breathing comfortably on room air.  Requested follow-up by pulmonology to address whether patient will need to be restarted on anticoagulation, now that  he has an IVC filter.  If he does need to be started on anticoagulation, per neurosurgery would have to wait at least 2 weeks from surgery  Chronic iron deficiency anemia Likely a consequence of his previously undiagnosed cancer - He did have some bleeding during surgery and was transfused 1 unit prbc - follow up hemglobin 10.3  PAD Status post left BKA - Plavix necessarily on hold due to above - continue statin - followed by Dr. Carlis Abbott -Per Dr. Reatha Armour, will wait at least 2 weeks before resuming antiplatelet agents  HTN Blood pressure stable-adjust medical therapy and follow  Uncontrolled DM 2 w/ hyperglycemia A1c 6.4 - blood sugars have been trending down as steroids are tapered -will adjust basal/bolus insulin  GERD Well compensated  Hyponatremia Corrected  Generalized weakness -PT/OT  Code Status: FULL CODE Family Communication: No family present at time of exam, updated son, Ed, over the phone 4/4 Status is: Inpatient  Remains inpatient appropriate because:Inpatient level of care appropriate due to severity of illness   Dispo: The patient is from: Home              Anticipated d/c is to: unclear              Patient currently is not medically stable to d/c.   Difficult to place patient No     Objective: Blood pressure (!) 115/94, pulse 62, temperature 98.2 F (36.8 C), temperature source Oral, resp. rate 16, height 5\' 9"  (1.753 m), weight 81.6 kg, SpO2 98 %.  Intake/Output Summary (Last 24 hours) at 07/11/2020 1706 Last data filed at 07/11/2020 1607 Gross per 24 hour  Intake 780 ml  Output 500 ml  Net 280 ml   Filed Weights   06/26/20 1856  Weight: 81.6 kg    Examination: General exam: Alert, awake, oriented x 3 HEENT: incision over right occipital skull Respiratory system: Clear to auscultation. Respiratory effort normal. Cardiovascular system:RRR. No murmurs, rubs, gallops. Gastrointestinal system: Abdomen is nondistended, soft and nontender. No  organomegaly or masses felt. Normal bowel sounds heard. Central nervous system: Alert and oriented. No focal neurological deficits. Extremities: left BKA Skin: No rashes, lesions or ulcers Psychiatry: Judgement and insight appear normal. Mood & affect appropriate.       CBC: Recent Labs  Lab 07/06/20 0811 07/08/20 0124 07/08/20 0940 07/08/20 1042 07/09/20 0545  WBC 20.2* 24.0*  --   --  23.9*  HGB 9.6* 8.8* 8.2* 10.9* 10.3*  HCT 32.3* 29.4* 24.0* 32.0* 33.2*  MCV 76.0* 76.2*  --   --  77.4*  PLT 453* 333  --   --  824   Basic Metabolic Panel: Recent Labs  Lab 07/07/20 1153 07/08/20 0124 07/08/20 0940 07/08/20 1042 07/09/20 0545  NA 137 136 136 137 136  K 4.5 4.6 4.4 4.8 4.2  CL 105 106  --   --  107  CO2 27 26  --   --  25  GLUCOSE 302* 249*  --   --  116*  BUN 29* 30*  --   --  20  CREATININE 0.86 1.10  --   --  0.83  CALCIUM 8.5* 8.4*  --   --  7.7*   GFR: Estimated Creatinine Clearance: 74.5 mL/min (by C-G formula based on SCr of 0.83 mg/dL).  Liver Function Tests: No results for input(s): AST, ALT, ALKPHOS, BILITOT, PROT, ALBUMIN in the last 168 hours.  Coagulation Profile: Recent Labs  Lab 07/07/20 1807  INR 1.0    HbA1C: Hgb A1c MFr Bld  Date/Time Value Ref Range Status  06/27/2020 05:29 AM 6.4 (H) 4.8 - 5.6 % Final    Comment:    (NOTE) Pre diabetes:          5.7%-6.4%  Diabetes:              >6.4%  Glycemic control for   <7.0% adults with diabetes   04/14/2020 12:00 AM 5.6 <5.7 % of total Hgb Final    Comment:    For the purpose of screening for the presence of diabetes: . <5.7%       Consistent with the absence of diabetes 5.7-6.4%    Consistent with increased risk for diabetes             (prediabetes) > or =6.5%  Consistent with diabetes . This assay result is consistent with a decreased risk of diabetes. . Currently, no consensus exists regarding use of hemoglobin A1c for diagnosis of diabetes in children. . According to  American Diabetes Association (ADA) guidelines, hemoglobin A1c <7.0% represents optimal control in non-pregnant diabetic patients. Different metrics may apply to specific patient populations.  Standards of Medical Care in Diabetes(ADA). .     CBG: Recent Labs  Lab 07/10/20 1622 07/10/20 2147 07/11/20 0733 07/11/20 1139 07/11/20 1625  GLUCAP 79 130* 78 102* 192*    No results found for this or any previous visit (from the past 240 hour(s)).   Scheduled Meds: . atorvastatin  10 mg Oral q1800  . Chlorhexidine Gluconate Cloth  6 each Topical Daily  . dexamethasone  4 mg Oral Q12H  . heparin injection (subcutaneous)  5,000 Units Subcutaneous  Q12H  . insulin aspart  0-20 Units Subcutaneous TID WC  . insulin aspart  0-5 Units Subcutaneous QHS  . insulin aspart  7 Units Subcutaneous TID WC  . insulin glargine  25 Units Subcutaneous BID  . levETIRAcetam  500 mg Oral BID  . lisinopril  20 mg Oral Daily  . pantoprazole  40 mg Oral Q0600  . traZODone  50 mg Oral QHS      LOS: 15 days   Kathie Dike, MD [facility] Office  [phone] Pager - Text Page per Shea Evans  If 7PM-7AM, please contact night-coverage per [place] 07/11/2020, 5:06 PM

## 2020-07-11 NOTE — Consult Note (Addendum)
Thomas Garcia  [phone] [phone]   MEDICAL ONCOLOGY - INITIAL CONSULTATION  Referral MD: Dr. Kathie Dike  Reason for Referral: Right occipital brain lesion, right lung mass  HPI: Thomas Garcia is a 80 year old male with a past medical history significant for hypertension, type 2 diabetes mellitus, peripheral arterial disease, history of splenectomy.  He presented to the emergency room for evaluation of generalized weakness, fatigue, nausea, loss of appetite, and cough with scant hemoptysis.  The patient was seen in the emergency room about 1 month ago due to shortness of breath and hemoptysis and was found to have small bilateral PE that were age-indeterminate as well as a masslike consolidation involving the right upper lobe of the lung with some cavitation.  Admission was recommended but the patient refused and went home.  Since that time, he has had progressive generalized weakness, fatigue, nausea, progressive loss of appetite, and family reports some periods of confusion.  He also continues to have cough and shortness of breath.  The patient was having some headaches as well.  CT of the head showed a 3.5 x 4.3 x 4.2 cm irregular heterogeneous mass at the right occipital cortex with associated hemorrhage and vasogenic edema.  A CT chest/abdomen/pelvis was performed which showed a large area of masslike opacity in the right upper lobe with extension along the right superior mediastinal margin into the right hilum and subcarinal space measuring approximately 6.4 x 8 cm (previously 5.2 x 6.3 cm).  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  He also has increasing subcarinal and right hilar extension with narrowing of the superior hilar airways and vessels including some occlusion of the right upper lobe airways.  The patient has been seen by pulmonology who recommends IR for embolization of mass if massive hemoptysis develops.  The patient has also been seen by  neurosurgery.  He went to the OR on 07/08/2020 for a right craniotomy for resection.  Pathology is currently pending.  The patient was seen this afternoon.  No family was at the bedside.  The patient tells me that he has been having progressive fatigue and weakness, poor appetite and has lost "lots of weight."  When asked to estimate how much weight he has lost he thinks it is about 25 pounds.  He reports mild discomfort in the back of the head at his surgical site.  He has been having some intermittent dizziness as well.  He also reports double vision which he thinks is worse since his surgery.  He currently denies chest pain but reports dyspnea with exertion, cough, and a small amount of hemoptysis.  He denies abdominal pain, nausea, vomiting.  He reports loose stools.  The patient has 3 sons-2 in [place] and one in [place].  Denies history of alcohol use.  The patient reports that he still smokes about 1 cigarette/day.  He has a 45-pack-year history.  Family history significant for several maternal uncles with cancer, but he cannot identify what type of cancer they had.  Medical oncology was asked see the patient for recommendations regarding his lung mass and brain mass.    Past Medical History:  Diagnosis Date  . Diabetes mellitus without complication ([place])   . Kidney stones   . PONV (postoperative nausea and vomiting)   :  Past Surgical History:  Procedure Laterality Date  . ABDOMINAL AORTOGRAM W/LOWER EXTREMITY N/A 09/17/2018   Procedure: ABDOMINAL AORTOGRAM W/LOWER EXTREMITY;  Surgeon: Marty Heck, MD;  Location: [place];  Service: Cardiovascular;  Laterality: N/A;  . AMPUTATION Left 09/22/2018   Procedure: AMPUTATION BELOW KNEE LEFT;  Surgeon: Marty Heck, MD;  Location: [place];  Service: Vascular;  Laterality: Left;  . AMPUTATION TOE Left 09/10/2018   Procedure: AMPUTATION OF THIRD TOE LEFT FOOT, DEBRIDEMENT OF ULCERS ON SECOND TOE AND PARTIAL AMPUTATION  SECOND TOE ON LEFT FOOT;  Surgeon: Virl Cagey, MD;  Location: [facility];  Service: General;  Laterality: Left;  . APPLICATION OF CRANIAL NAVIGATION N/A 07/08/2020   Procedure: APPLICATION OF CRANIAL NAVIGATION;  Surgeon: Dawley, Theodoro Doing, DO;  Location: [place];  Service: Neurosurgery;  Laterality: N/A;  . APPLICATION OF WOUND VAC Left 09/18/2018   Procedure: APPLICATION OF WOUND VAC;  Surgeon: Marty Heck, MD;  Location: [place];  Service: Vascular;  Laterality: Left;  . CRANIOTOMY Right 07/08/2020   Procedure: Right Occipital craniotomy for tumor resection with brainlab;  Surgeon: Dawley, Theodoro Doing, DO;  Location: [place];  Service: Neurosurgery;  Laterality: Right;  . CYSTOSCOPY  05/08/2012   Procedure: CYSTOSCOPY;  Surgeon: Marissa Nestle, MD;  Location: [facility];  Service: Urology;  Laterality: N/A;  Evacuation of clots  . IR IVC FILTER PLMT / S&I /IMG GUID/MOD SED  07/05/2020  . KIDNEY SURGERY     sutures   . PERIPHERAL VASCULAR BALLOON ANGIOPLASTY  09/17/2018   Procedure: PERIPHERAL VASCULAR BALLOON ANGIOPLASTY;  Surgeon: Marty Heck, MD;  Location: [place] [facility];  Service: Cardiovascular;;  LT SFA and AT  . RADIOLOGY WITH ANESTHESIA N/A 06/30/2020   Procedure: MRI WITH ANESTHESIA   BRAIN WITH AND WITHOUT CONTRAST;  Surgeon: Radiologist, Medication, MD;  Location: [place];  Service: Radiology;  Laterality: N/A;  . SPLENECTOMY    . TRANSMETATARSAL AMPUTATION Left 09/18/2018   Procedure: TRANSMETATARSAL AMPUTATION LEFT FOOT;  Surgeon: Marty Heck, MD;  Location: [place];  Service: Vascular;  Laterality: Left;  . TRANSURETHRAL RESECTION OF PROSTATE  05/14/2012   Procedure: TRANSURETHRAL RESECTION OF THE PROSTATE (TURP);  Surgeon: Marissa Nestle, MD;  Location: [facility];  Service: Urology;  Laterality: N/A;  :  Current Facility-Administered Medications  Medication Dose Route Frequency Provider Last Rate Last Admin  . acetaminophen (TYLENOL) tablet 650 mg  650 mg Oral Q6H PRN  Dawley, Troy C, DO      . atorvastatin (LIPITOR) tablet 10 mg  10 mg Oral q1800 Dawley, Troy C, DO   10 mg at 07/10/20 1808  . calcium carbonate (TUMS - dosed in mg elemental calcium) chewable tablet 200-400 mg of elemental calcium  1-2 tablet Oral QID PRN Dawley, Troy C, DO   200 mg of elemental calcium at 07/07/20 2158  . Chlorhexidine Gluconate Cloth 2 % PADS 6 each  6 each Topical Daily Dawley, Troy C, DO   6 each at 07/09/20 1543  . dexamethasone (DECADRON) tablet 4 mg  4 mg Oral Q12H Dawley, Troy C, DO      . heparin injection 5,000 Units  5,000 Units Subcutaneous Q12H Dawley, Troy C, DO   5,000 Units at 07/11/20 1058  . HYDROcodone-acetaminophen (NORCO/VICODIN) 5-325 MG per tablet 1 tablet  1 tablet Oral Q6H PRN Dawley, Troy C, DO   1 tablet at 07/10/20 0840  . insulin aspart (novoLOG) injection 0-20 Units  0-20 Units Subcutaneous TID WC Dawley, Troy C, DO   4 Units at 07/07/20 1734  . insulin aspart (novoLOG) injection 0-5 Units  0-5 Units Subcutaneous QHS Dawley, Troy C, DO  2 Units at 07/08/20 2200  . insulin aspart (novoLOG) injection 10 Units  10 Units Subcutaneous TID WC Dawley, Troy C, DO   10 Units at 07/10/20 1346  . insulin glargine (LANTUS) injection 30 Units  30 Units Subcutaneous BID Dawley, Troy C, DO   30 Units at 07/10/20 2150  . labetalol (NORMODYNE) injection 10-40 mg  10-40 mg Intravenous Q10 min PRN Dawley, Troy C, DO   20 mg at 07/08/20 1807  . levETIRAcetam (KEPPRA) tablet 500 mg  500 mg Oral BID Dawley, Troy C, DO   500 mg at 07/11/20 8850  . lisinopril (ZESTRIL) tablet 20 mg  20 mg Oral Daily Jacky Kindle, MD   20 mg at 07/11/20 0833  . LORazepam (ATIVAN) injection 0.5 mg  0.5 mg Intravenous Q6H PRN Dawley, Troy C, DO   0.5 mg at 07/07/20 1202  . morphine 2 MG/ML injection 1-2 mg  1-2 mg Intravenous Q2H PRN Dawley, Troy C, DO   2 mg at 07/10/20 2151  . ondansetron (ZOFRAN) injection 4 mg  4 mg Intravenous Q6H PRN Dawley, Troy C, DO   4 mg at 06/27/20 0115  .  pantoprazole (PROTONIX) EC tablet 40 mg  40 mg Oral Q0600 Dawley, Troy C, DO   40 mg at 07/11/20 0525  . promethazine (PHENERGAN) tablet 12.5-25 mg  12.5-25 mg Oral Q4H PRN Dawley, Troy C, DO      . senna-docusate (Senokot-S) tablet 1 tablet  1 tablet Oral QHS PRN Dawley, Troy C, DO      . traZODone (DESYREL) tablet 50 mg  50 mg Oral QHS Dawley, Troy C, DO   50 mg at 07/10/20 2151     No Known Allergies:  Family History  Problem Relation Age of Onset  . Diabetes Mother   :  Social History   Socioeconomic History  . Marital status: Legally Separated    Spouse name: Not on file  . Number of children: Not on file  . Years of education: Not on file  . Highest education level: Not on file  Occupational History  . Not on file  Tobacco Use  . Smoking status: Former Smoker    Packs/day: 0.50    Years: 45.00    Pack years: 22.50    Types: Cigarettes    Quit date: 05/20/2020    Years since quitting: 0.1  . Smokeless tobacco: Never Used  Vaping Use  . Vaping Use: Never used  Substance and Sexual Activity  . Alcohol use: No  . Drug use: No  . Sexual activity: Not on file  Other Topics Concern  . Not on file  Social History Narrative   Divorced since 2011.Lives with brother.Retired,previously maintenance work for [employer]   Social Determinants of Health   Financial Resource Strain: Not on file  Food Insecurity: Not on file  Transportation Needs: Not on file  Physical Activity: Not on file  Stress: Not on file  Social Connections: Not on file  Intimate Partner Violence: Not on file  :  Review of Systems: A comprehensive 14 point review of systems was negative except as noted in the HPI.  Exam: Patient Vitals for the past 24 hrs:  BP Temp Temp src Pulse Resp SpO2  07/11/20 1059 (!) 179/69 97.9 F (36.6 C) Oral 79 14 97 %  07/11/20 0736 (!) 162/54 98 F (36.7 C) Oral 60 16 100 %  07/11/20 0400 (!) 150/70 97.8 F (36.6 C) Oral 60 18 --  07/10/20 2000 Marland Kitchen)  150/56  97.8 F (36.6 C) Oral (!) 58 20 100 %  07/10/20 1506 (!) 174/68 98.4 F (36.9 C) Oral 65 18 96 %    General: Awake and alert, no distress Eyes:  no scleral icterus.   ENT:  There were no oropharyngeal lesions.     Lymphatics:  Negative cervical, supraclavicular or axillary adenopathy.   Respiratory: lungs were clear bilaterally without wheezing or crackles.   Cardiovascular:  Regular rate and rhythm, S1/S2, without murmur, rub or gallop.  No edema to the right lower extremity. GI:  abdomen was soft, flat, nontender, nondistended, without organomegaly.   Musculoskeletal: Strength symmetrical in the upper extremity.  Status post left BKA. Skin: No petechiae.  Right lower extremity cold to touch with stasis changes. Neuro exam was nonfocal. Patient was alert and oriented.  Attention was good.   Language was appropriate.  Mood was normal without depression.  Speech was not pressured.  Thought content was not tangential.     Lab Results  Component Value Date   WBC 23.9 (H) 07/09/2020   HGB 10.3 (L) 07/09/2020   HCT 33.2 (L) 07/09/2020   PLT 248 07/09/2020   GLUCOSE 116 (H) 07/09/2020   CHOL 109 04/14/2020   TRIG 85 04/14/2020   HDL 49 04/14/2020   LDLCALC 43 04/14/2020   ALT 11 06/26/2020   AST 12 (L) 06/26/2020   NA 136 07/09/2020   K 4.2 07/09/2020   CL 107 07/09/2020   CREATININE 0.83 07/09/2020   BUN 20 07/09/2020   CO2 25 07/09/2020    CT Head Wo Contrast  Result Date: 06/26/2020 CLINICAL DATA:  Initial evaluation for acute neuro deficit, stroke suspected. EXAM: CT HEAD WITHOUT CONTRAST TECHNIQUE: Contiguous axial images were obtained from the base of the skull through the vertex without intravenous contrast. COMPARISON:  None available. 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 There is an irregular heterogeneous mass positioned at the right occipital cortex measuring 3.5 x 4.3 x 4.2 cm in greatest dimensions, most concerning  for a primary CNS neoplasm. Associated hyperdensity suggests a degree of associated hemorrhage. Associated vasogenic edema throughout the posterior right parieto-occipital region with partial effacement of the posterior right lateral ventricle. No more than trace 2 mm right-to-left shift. Basilar cisterns remain patent. No other acute intracranial hemorrhage or large vessel territory infarct. No extra-axial fluid collection. Vascular: No hyperdense vessel. Scattered vascular calcifications noted within the carotid siphons. Skull: Scalp soft tissues and calvarium within normal limits. Sinuses/Orbits: Globes and orbital soft tissues demonstrate no acute finding. Paranasal sinuses are clear. No mastoid effusion. Other: None. IMPRESSION: 1. 3.5 x 4.3 x 4.2 cm irregular heterogeneous mass positioned at the right occipital cortex. Intrinsic hyperdensity suggests associated hemorrhage. Vasogenic edema throughout the posterior right parieto-occipital region with trace 2 mm right-to-left shift. Differential considerations include a primary CNS neoplasm versus solitary intracranial metastasis. 2. 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 Critical Value/emergent results were called by telephone at the time of interpretation on 06/26/2020 at 9:10 pm to provider [facility] , who verbally acknowledged these results. Electronically Signed   By: Jeannine Boga M.D.   On: 06/26/2020 21:13   MR BRAIN WO CONTRAST  Result Date: 06/28/2020 CLINICAL DATA:  80 year old male with neurologic deficit, hemorrhage and edema in the right occipital lobe on noncontrast head CT. And enlarging right upper lobe lung masslike opacity on CT Chest, Abdomen, and Pelvis. Possible hemorrhagic metastasis or primary tumor. EXAM: MRI HEAD WITHOUT CONTRAST TECHNIQUE:  Multiplanar, multiecho pulse sequences of the brain and surrounding structures were obtained without intravenous contrast. COMPARISON:  Head CT without  contrast 06/26/2020. FINDINGS: The examination had to be discontinued prior to completion due to patient inability to continue, despite premedication. No contrast was administered. Axial DWI/DTI, along with T2, SWI and T1 weighted imaging was obtained. Brain: Hemorrhage related susceptibility artifact throughout the right occipital pole. Areas of T1 and T2 hyperintense blood products demonstrate diffusion restriction, but there is no larger area of restricted diffusion to strongly suggest acute infarction. Heterogeneous T1 and T2 blood products with decreased susceptibility throughout the right occipital pole tracking to the right occipital horn encompass an area of 56 x 37 x 42 mm (AP by transverse by CC), for an estimated volume of 43 mL. Confluent vasogenic edema tracking superiorly and anteriorly from the hemorrhage with facilitated diffusion and appears stable from the recent CT. Stable regional mass effect. No midline shift at this time. Basilar cisterns remain patent. Small volume of right lateral intraventricular hemorrhage. No ventriculomegaly. Chronic lacunar infarct left thalamus. No definite additional intracranial hemosiderin, suspect artifact rather than superficial siderosis on SWI in the left perirolandic region on series 13, image 86. Cervicomedullary junction and pituitary are within normal limits. No other vasogenic edema is evident. No other suspicious brain lesion is evident on the provided sequences. Vascular: Major intracranial vascular flow voids are preserved. Skull and upper cervical spine: Negative. Visualized bone marrow signal is within normal limits. Sinuses/Orbits: Stable, negative. Other: Mastoids are clear. IMPRESSION: 1. Truncated exam and motion degraded despite premedication and repeated imaging attempts. No contrast was administered. 2. Apparently solitary acute hemorrhage or hemorrhagic lesion in the right occipital pole encompassing 43 mL, indeterminate for tumor. Abundant  regional vasogenic edema and mild mass effect stable from the CT. Trace right lateral IVH with no ventriculomegaly. 3. Chronic lacunar infarct left thalamus. Electronically Signed   By: Genevie Ann M.D.   On: 06/28/2020 05:52   MR BRAIN W WO CONTRAST  Result Date: 07/10/2020 CLINICAL DATA:  Follow-up examination status post right-sided craniotomy for tumor resection. EXAM: MRI HEAD WITHOUT AND WITH CONTRAST TECHNIQUE: Multiplanar, multiecho pulse sequences of the brain and surrounding structures were obtained without and with intravenous contrast. CONTRAST:  7.62mL GADAVIST GADOBUTROL 1 MMOL/ML IV SOLN COMPARISON:  Previous brain MRI from 06/30/2020. FINDINGS: Brain: Postoperative changes from interval right parieto-occipital craniotomy. Small subdural collection subjacent to the craniotomy bone flap measures up to 5 mm in thickness without significant mass effect. Superimposed postoperative pneumocephalus. Additional postoperative pneumocephalus overlies the right frontal convexity anteriorly. 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 Deep margin of the resection cavity extends to occipital horn of the right lateral ventricle, with adjacent trace intraventricular blood products. Smooth postcontrast enhancement seen about the resection cavity following contrast administration favored to in large part be postoperative. No obvious residual tumor identified. No significant infarct about the surgical site or other complication. Persistent vasogenic edema seen throughout the posterior right cerebral hemisphere, relatively similar as compared to preoperative exam at this time. No significant midline shift. Basilar cisterns remain patent. No hydrocephalus. Remainder the brain is stable in appearance with associated atrophy and chronic microvascular ischemic disease. No other infarct or other acute intracranial  abnormality. No other abnormal enhancement. Vascular: Major intracranial vascular flow voids are maintained. Normal flow voids seen within the major dural sinuses. Skull and upper cervical spine: Craniocervical junction within normal limits.  Bone marrow signal intensity normal. Post craniotomy changes at the right posterior scalp without adverse features. Sinuses/Orbits: Globes and orbital soft tissues within normal limits. Paranasal sinuses are largely clear. Trace left mastoid effusion noted, of doubtful significance. Other: None. IMPRESSION: 1. Postoperative changes from interval right parieto-occipital craniotomy for tumor resection. Relatively smooth enhancement about the resection cavity favored to be postoperative in nature. No gross residual tumor evident at this time. 2. Residual vasogenic edema throughout the posterior right cerebral hemisphere, relatively similar as compared to preoperative exam. 3. Otherwise stable appearance of the brain, with no other new acute intracranial abnormality. Electronically Signed   By: Jeannine Boga M.D.   On: 07/10/2020 00:25   MR BRAIN W WO CONTRAST  Result Date: 06/30/2020 CLINICAL DATA:  Brain/CNS neoplasm, staging. EXAM: MRI HEAD WITHOUT AND WITH CONTRAST TECHNIQUE: Multiplanar, multiecho pulse sequences of the brain and surrounding structures were obtained without and with intravenous contrast. CONTRAST:  26mL GADAVIST GADOBUTROL 1 MMOL/ML IV SOLN COMPARISON:  MRI of the brain June 28, 2020. FINDINGS: Brain: Redemonstrated large hemorrhagic lesion in the right occipital lobe measuring approximately 6.0 x 1.5 x 3.7 cm, not significantly changed from prior MRI. The lesion shows thin peripheral multilobular contrast enhancement extending from the cortical surface to the right atrium subependymal region with extension of the hemorrhage into the right lateral ventricle. Prominent vasogenic edema surrounding the lesion and extending into the right parietal and  temporal lobes with mass effect on the occipital horn and atrium of the right lateral ventricle. No hydrocephalus or significant midline shift. No other enhancing lesion identified. Scattered foci of T2 hyperintensity are seen within the white matter of the cerebral hemispheres, nonspecific, most likely related to chronic small vessel ischemia. Vascular: Normal flow voids. Skull and upper cervical spine: Normal marrow signal. Sinuses/Orbits: Negative. 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 Electronically Signed   By: Pedro Earls M.D.   On: 06/30/2020 14:54   IR IVC FILTER PLMT / S&I Burke Keels GUID/MOD SED  Result Date: 07/05/2020 CLINICAL DATA:  80 year old male with history of subclinical pulmonary emboli and brain mass with forthcoming neuro surgical intervention EXAM: 1. ULTRASOUND GUIDANCE FOR VASCULAR ACCESS OF THE RIGHT  VEIN. 2. IVC VENOGRAM. 3. PERCUTANEOUS IVC FILTER PLACEMENT. ANESTHESIA/SEDATION: One mg IV Versed; 25 mcg IV Fentanyl. Total Moderate Sedation Time Tenminutes. CONTRAST:  37mL OMNIPAQUE IOHEXOL 300 MG/ML  SOLN FLUOROSCOPY TIME:  54 seconds, 98 mGy PROCEDURE: The procedure, risks, benefits, and alternatives were explained to the patient. Questions regarding the procedure were encouraged and answered. The patient understands and consents to the procedure. The patient was prepped with Betadine in a sterile fashion, and a sterile drape was applied covering the operative field. A sterile gown and sterile gloves were used for the procedure. Local anesthesia was provided with 1% Lidocaine. Under direct ultrasound guidance, a 21 gauge needle was advanced into the right internal jugular vein with ultrasound image documentation performed. After securing access with a micropuncture dilator, a guidewire was advanced into the  inferior vena cava. A deployment sheath was advanced over the guidewire. This was utilized to perform IVC venography. The deployment sheath was further positioned in an appropriate location for filter deployment. A Denali IVC filter was then advanced in the sheath. This was then fully deployed in the infrarenal IVC. Final filter position was confirmed with a fluoroscopic spot image. Contrast  injection was also performed through the sheath under fluoroscopy to confirm patency of the IVC at the level of the filter. After the procedure the sheath was removed and hemostasis obtained with manual compression. COMPLICATIONS: None. FINDINGS: IVC venography demonstrates a normal caliber IVC with no evidence of thrombus. Renal veins are identified bilaterally. The IVC filter was successfully positioned below the level of the renal veins and is appropriately oriented. This IVC filter has both permanent and retrievable indications. IMPRESSION: Placement of percutaneous IVC filter in infrarenal IVC. IVC venogram shows no evidence of IVC thrombus and normal caliber of the inferior vena cava. This filter does have both permanent and retrievable indications. PLAN: This IVC filter is potentially retrievable. The patient will be assessed for filter retrieval by Interventional Radiology in approximately 8-12 weeks. Further recommendations regarding filter retrieval, continued surveillance or declaration of device permanence, will be made at that time. Ruthann Cancer, MD [facility] Rad[facility] Electronically Signed   By: Ruthann Cancer MD   On: 07/05/2020 15:23   CT CHEST ABDOMEN PELVIS W CONTRAST  Result Date: 06/28/2020 CLINICAL DATA:  Brain lesion, assess for metastatic disease EXAM: CT CHEST, ABDOMEN, AND PELVIS WITH CONTRAST TECHNIQUE: Multidetector CT imaging of the chest, abdomen and pelvis was performed following the standard protocol during bolus administration of intravenous  contrast. CONTRAST:  17mL OMNIPAQUE IOHEXOL 300 MG/ML  SOLN COMPARISON:  CT angiography 05/27/2020, CT abdomen and pelvis 04/27/2012 FINDINGS: CT CHEST FINDINGS Cardiovascular: Normal heart size. No pericardial effusion. Coronary artery calcifications are present. The aortic root is suboptimally assessed given cardiac pulsation artifact. Atherosclerotic plaque within the normal caliber aorta and proximal great vessels. No acute luminal abnormality of the imaged aorta. No periaortic stranding or hemorrhage. Normal 3 vessel branching of the aortic arch. Proximal great vessels are free of acute abnormality. Central pulmonary arteries are normal caliber. Linear hypodense filling defect in the distal left main pulmonary artery at the bifurcation of the lingular and left lower lobar arteries (3/27) several additional smaller possible filling defects are seen pulmonary arteries of the left lower lobe (3/34) and right lower lobe (3/31). Truncation of the right posterior segmental pulmonary artery as it enters the region of the heterogeneous attenuating masslike opacity in the right upper lobe. Additional narrowing of the adjacent pulmonary veins and hilar airways as well. Central pulmonary arteries remain normal caliber. RV/LV ratio is maintained. Mediastinum/Nodes: Mass within the right upper with extension along the right superior mediastinal margin extending into the right hilum and subcarinal space. There is associated narrowing of the superior hilar airways and vessels. A right superior hilar deposit measures up to 12 mm in size, previously 9 mm (3/23) increasing subcarinal extension as well narrowing the right mainstem bronchus with a subcarinal deposit measures approximately 2.3 cm on today's exam, previously 2.1 cm. No contralateral hilar adenopathy. The right lateral margin of the midthoracic esophagus is difficult to separate from this soft tissue mass. Lungs/Pleura: Large area of masslike opacity in the right  upper lobe with extension along the right superior mediastinal margin and into the hilum and subcarinal space. This measures approximately 6.4 x 8 cm, previously 5.2 x 6.3 cm at similar levels. There is decreasing central cavitation now with a slightly more hypoattenuating necrotic appearance on today's exam. Associated narrowing of the hilar airways and vessels including some occlusion of the upper lobe airways centrally. Surrounding ground-glass and consolidative opacity may reflect some postobstructive pneumonia change. Some additional more patchy ground-glass opacities in the right  upper lobe periphery are similar to prior. Stable 4 mm subpleural nodules seen in the lateral and posterior right lower lobe. Remaining portions of the lungs are clear. No pneumothorax or pleural effusion. Minimal atelectasis. Musculoskeletal: No worrisome chest wall mass or lesion. No direct extension into the chest wall or adjacent osseous structures by the upper lobe mass. Multilevel degenerative changes are present in the imaged portions of the spine. Exaggerated thoracic kyphosis with some degenerative vertebral body height loss and multilevel Schmorl's node formations. No acute osseous abnormality or suspicious osseous lesion. CT ABDOMEN PELVIS FINDINGS Hepatobiliary: No worrisome focal liver lesions. Smooth liver surface contour. Normal hepatic attenuation. Normal gallbladder and biliary tree. Pancreas: No pancreatic ductal dilatation or surrounding inflammatory changes. 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 Adrenals/Urinary Tract: Hypoattenuating nodule in the medial limb of the right adrenal gland again measures up to 1 cm in size, unchanged since 2014 and strongly favoring a benign process such as adenoma rather than metastatic lesion. No concerning right adrenal  lesion. Stable appearance of the multiple fluid attenuation cyst in a slightly more intermediate attenuation exophytic cyst arising from the lower pole left kidney. Mild symmetric bilateral perinephric stranding, a nonspecific finding which may correlate with advanced age or decreased renal function. No concerning renal mass, obstructive urolith or hydronephrosis. Multiple coarse calcifications layering dependently within the bladder may reflect bladder calculi though these were not present on comparison imaging. There is however some chronic indentation of the urinary bladder by an enlarged, heterogeneous prostate gland. Stomach/Bowel: Distal esophagus, stomach and duodenal sweep are unremarkable. No small bowel wall thickening or dilatation. No evidence of obstruction. A normal appendix is visualized. No colonic dilatation or wall thickening. Vascular/Lymphatic: Atherosclerotic calcifications within the abdominal aorta and branch vessels. No aneurysm or ectasia. No enlarged abdominopelvic lymph nodes. Reproductive: Heterogeneous, enlarged prostate with few typically benign prostate calcifications. Indentation of the bladder base. Other: No abdominopelvic free fluid or free gas. No bowel containing hernias. Musculoskeletal: Multilevel degenerative changes are present in the imaged portions of the spine. Additional degenerative changes in the hips and pelvis. The osseous structures appear diffusely demineralized which may limit detection of small or nondisplaced fractures. No acute osseous abnormality or suspicious osseous lesion. IMPRESSION: 1. Large area of masslike opacity in the right upper lobe with extension along the right superior mediastinal margin and into the right hilum and subcarinal space. This measures approximately 6.4 x 8 cm, previously 5.2 x 6.3 cm at similar levels. There is decreasing central cavitation now with a slightly more hypoattenuating necrotic appearance on today's exam. Findings are  concerning for primary lung malignancy, particularly given the findings in the brain. Necrotic/atypical infection is less favored. Consider further evaluation with tissue sampling potentially via bronchoscopy. 2. Increasing subcarinal and right hilar extension with narrowing of the superior hilar airways and vessels including some occlusion of the right upper lobe airways. The right lateral margin of the midthoracic esophagus is difficult to separate from this soft tissue mass. 3. Some more peripheral areas of patchy consolidation interstitial opacity throughout the right upper lobe likely reflects a postobstructive process 4. Linear hypodense filling defect in the distal left main pulmonary artery at the bifurcation of the lingular and left lower lobar arteries, several additional smaller possible filling defects are seen pulmonary arteries of the left lower lobe and right lower lobe (3/34) and right lower lobe (3/31). Findings  are concerning for pulmonary emboli. No CT evidence of right heart strain within limitations of this exam. 5. Multiple coarse calcifications layering dependently within the bladder may reflect bladder calculi though these were not present on comparison imaging. No obstructive urolithiasis or hydronephrosis. 6. Heterogeneous, enlarged prostate gland with some chronic indentation of the urinary bladder base. Could correlate for features of outlet obstruction with urinalysis were clinically appropriate. Furthermore recommend assessment with prostate exam and PSA as warranted. 7. No other convincing evidence of primary malignancy or metastatic disease within the abdomen or pelvis. Electronically Signed   By: Lovena Le M.D.   On: 06/28/2020 01:40   DG Chest Portable 1 View  Result Date: 06/26/2020 CLINICAL DATA:  Fever, chills, weakness, cough EXAM: PORTABLE CHEST 1 VIEW COMPARISON:  05/27/2020 FINDINGS: Consolidation again seen in the right upper lobe compatible with pneumonia. This has  improved since prior study. No confluent opacity on the left. Heart is normal size. Aortic atherosclerosis. No effusions. No acute bony abnormality. IMPRESSION: Continued but improving right upper lobe consolidation/pneumonia. Electronically Signed   By: Rolm Baptise M.D.   On: 06/26/2020 20:27   VAS [name] LOWER EXTREMITY VENOUS (DVT)  Result Date: 06/28/2020  Lower Venous DVT Study Indications: Pulmonary embolism.  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 Comparison Study: No prior venous studies available. Performing Technologist: Darlin Coco RDMS,RVT  Examination Guidelines: A complete evaluation includes B-mode imaging, spectral Doppler, color Doppler, and power Doppler as needed of all accessible portions of each vessel. Bilateral testing is considered an integral part of a complete examination. Limited examinations for reoccurring indications may be performed as noted. The reflux portion of the exam is performed with the patient in reverse Trendelenburg.  +---------+---------------+---------+-----------+----------+--------------+ RIGHT    CompressibilityPhasicitySpontaneityPropertiesThrombus Aging +---------+---------------+---------+-----------+----------+--------------+ CFV      Full           Yes      Yes                                 +---------+---------------+---------+-----------+----------+--------------+ SFJ      Full                                                        +---------+---------------+---------+-----------+----------+--------------+ FV Prox  Full                                                        +---------+---------------+---------+-----------+----------+--------------+ FV Mid   Full                                                        +---------+---------------+---------+-----------+----------+--------------+ FV DistalFull                                                         +---------+---------------+---------+-----------+----------+--------------+  PFV      Full                                                        +---------+---------------+---------+-----------+----------+--------------+ POP      Full           Yes      Yes                                 +---------+---------------+---------+-----------+----------+--------------+ PTV      Full                                                        +---------+---------------+---------+-----------+----------+--------------+ PERO     Full                                                        +---------+---------------+---------+-----------+----------+--------------+   +---------+---------------+---------+-----------+----------+--------------+ LEFT     CompressibilityPhasicitySpontaneityPropertiesThrombus Aging +---------+---------------+---------+-----------+----------+--------------+ CFV      Full           Yes      Yes                                 +---------+---------------+---------+-----------+----------+--------------+ SFJ      Full                                                        +---------+---------------+---------+-----------+----------+--------------+ FV Prox  Full                                                        +---------+---------------+---------+-----------+----------+--------------+ FV Mid   Full                                                        +---------+---------------+---------+-----------+----------+--------------+ FV DistalFull                                                        +---------+---------------+---------+-----------+----------+--------------+ PFV      Full                                                        +---------+---------------+---------+-----------+----------+--------------+  POP      Full           Yes      Yes                                  +---------+---------------+---------+-----------+----------+--------------+ PTV                                                   BKA            +---------+---------------+---------+-----------+----------+--------------+ PERO                                                  BKA            +---------+---------------+---------+-----------+----------+--------------+     Summary: RIGHT: - There is no evidence of deep vein thrombosis in the lower extremity.  - No cystic structure found in the popliteal fossa.  LEFT: - There is no evidence of deep vein thrombosis in the lower extremity.  - No cystic structure found in the popliteal fossa.  *See table(s) above for measurements and observations. Electronically signed by Deitra Mayo MD on 06/28/2020 at 3:46:57 PM.    Final      CT Head Wo Contrast  Result Date: 06/26/2020 CLINICAL DATA:  Initial evaluation for acute neuro deficit, stroke suspected. EXAM: CT HEAD WITHOUT CONTRAST TECHNIQUE: Contiguous axial images were obtained from the base of the skull through the vertex without intravenous contrast. COMPARISON:  None available. 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 There is an irregular heterogeneous mass positioned at the right occipital cortex measuring 3.5 x 4.3 x 4.2 cm in greatest dimensions, most concerning for a primary CNS neoplasm. Associated hyperdensity suggests a degree of associated hemorrhage. Associated vasogenic edema throughout the posterior right parieto-occipital region with partial effacement of the posterior right lateral ventricle. No more than trace 2 mm right-to-left shift. Basilar cisterns remain patent. No other acute intracranial hemorrhage or large vessel territory infarct. No extra-axial fluid collection. Vascular: No hyperdense vessel. Scattered vascular calcifications noted within the carotid siphons. Skull: Scalp soft tissues and calvarium within normal limits.  Sinuses/Orbits: Globes and orbital soft tissues demonstrate no acute finding. Paranasal sinuses are clear. No mastoid effusion. Other: None. IMPRESSION: 1. 3.5 x 4.3 x 4.2 cm irregular heterogeneous mass positioned at the right occipital cortex. Intrinsic hyperdensity suggests associated hemorrhage. Vasogenic edema throughout the posterior right parieto-occipital region with trace 2 mm right-to-left shift. Differential considerations include a primary CNS neoplasm versus solitary intracranial metastasis. 2. 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 Critical Value/emergent results were called by telephone at the time of interpretation on 06/26/2020 at 9:10 pm to provider [facility] , who verbally acknowledged these results. Electronically Signed   By: Jeannine Boga M.D.   On: 06/26/2020 21:13   MR BRAIN WO CONTRAST  Result Date: 06/28/2020 CLINICAL DATA:  80 year old male with neurologic deficit, hemorrhage and edema in the right occipital lobe on noncontrast head CT. And enlarging right upper lobe lung masslike opacity on CT Chest, Abdomen, and Pelvis. Possible hemorrhagic metastasis or primary tumor. EXAM: MRI HEAD WITHOUT CONTRAST TECHNIQUE: Multiplanar, multiecho  pulse sequences of the brain and surrounding structures were obtained without intravenous contrast. COMPARISON:  Head CT without contrast 06/26/2020. FINDINGS: The examination had to be discontinued prior to completion due to patient inability to continue, despite premedication. No contrast was administered. Axial DWI/DTI, along with T2, SWI and T1 weighted imaging was obtained. Brain: Hemorrhage related susceptibility artifact throughout the right occipital pole. Areas of T1 and T2 hyperintense blood products demonstrate diffusion restriction, but there is no larger area of restricted diffusion to strongly suggest acute infarction. Heterogeneous T1 and T2 blood products with decreased susceptibility throughout  the right occipital pole tracking to the right occipital horn encompass an area of 56 x 37 x 42 mm (AP by transverse by CC), for an estimated volume of 43 mL. Confluent vasogenic edema tracking superiorly and anteriorly from the hemorrhage with facilitated diffusion and appears stable from the recent CT. Stable regional mass effect. No midline shift at this time. Basilar cisterns remain patent. Small volume of right lateral intraventricular hemorrhage. No ventriculomegaly. Chronic lacunar infarct left thalamus. No definite additional intracranial hemosiderin, suspect artifact rather than superficial siderosis on SWI in the left perirolandic region on series 13, image 86. Cervicomedullary junction and pituitary are within normal limits. No other vasogenic edema is evident. No other suspicious brain lesion is evident on the provided sequences. Vascular: Major intracranial vascular flow voids are preserved. Skull and upper cervical spine: Negative. Visualized bone marrow signal is within normal limits. Sinuses/Orbits: Stable, negative. Other: Mastoids are clear. IMPRESSION: 1. Truncated exam and motion degraded despite premedication and repeated imaging attempts. No contrast was administered. 2. Apparently solitary acute hemorrhage or hemorrhagic lesion in the right occipital pole encompassing 43 mL, indeterminate for tumor. Abundant regional vasogenic edema and mild mass effect stable from the CT. Trace right lateral IVH with no ventriculomegaly. 3. Chronic lacunar infarct left thalamus. Electronically Signed   By: Genevie Ann M.D.   On: 06/28/2020 05:52   MR BRAIN W WO CONTRAST  Result Date: 07/10/2020 CLINICAL DATA:  Follow-up examination status post right-sided craniotomy for tumor resection. EXAM: MRI HEAD WITHOUT AND WITH CONTRAST TECHNIQUE: Multiplanar, multiecho pulse sequences of the brain and surrounding structures were obtained without and with intravenous contrast. CONTRAST:  7.55mL GADAVIST GADOBUTROL 1  MMOL/ML IV SOLN COMPARISON:  Previous brain MRI from 06/30/2020. FINDINGS: Brain: Postoperative changes from interval right parieto-occipital craniotomy. Small subdural collection subjacent to the craniotomy bone flap measures up to 5 mm in thickness without significant mass effect. Superimposed postoperative pneumocephalus. Additional postoperative pneumocephalus overlies the right frontal convexity anteriorly. 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 Deep margin of the resection cavity extends to occipital horn of the right lateral ventricle, with adjacent trace intraventricular blood products. Smooth postcontrast enhancement seen about the resection cavity following contrast administration favored to in large part be postoperative. No obvious residual tumor identified. No significant infarct about the surgical site or other complication. Persistent vasogenic edema seen throughout the posterior right cerebral hemisphere, relatively similar as compared to preoperative exam at this time. No significant midline shift. Basilar cisterns remain patent. No hydrocephalus. Remainder the brain is stable in appearance with associated atrophy and chronic microvascular ischemic disease. No other infarct or other acute intracranial abnormality. No other abnormal enhancement. Vascular: Major intracranial vascular flow voids are maintained. Normal flow voids seen within the major dural sinuses. Skull and upper cervical spine: Craniocervical junction within normal limits. Bone marrow signal  intensity normal. Post craniotomy changes at the right posterior scalp without adverse features. Sinuses/Orbits: Globes and orbital soft tissues within normal limits. Paranasal sinuses are largely clear. Trace left mastoid effusion noted, of doubtful significance. Other: None. IMPRESSION: 1. Postoperative changes from interval  right parieto-occipital craniotomy for tumor resection. Relatively smooth enhancement about the resection cavity favored to be postoperative in nature. No gross residual tumor evident at this time. 2. Residual vasogenic edema throughout the posterior right cerebral hemisphere, relatively similar as compared to preoperative exam. 3. Otherwise stable appearance of the brain, with no other new acute intracranial abnormality. Electronically Signed   By: Jeannine Boga M.D.   On: 07/10/2020 00:25   MR BRAIN W WO CONTRAST  Result Date: 06/30/2020 CLINICAL DATA:  Brain/CNS neoplasm, staging. EXAM: MRI HEAD WITHOUT AND WITH CONTRAST TECHNIQUE: Multiplanar, multiecho pulse sequences of the brain and surrounding structures were obtained without and with intravenous contrast. CONTRAST:  71mL GADAVIST GADOBUTROL 1 MMOL/ML IV SOLN COMPARISON:  MRI of the brain June 28, 2020. FINDINGS: Brain: Redemonstrated large hemorrhagic lesion in the right occipital lobe measuring approximately 6.0 x 1.5 x 3.7 cm, not significantly changed from prior MRI. The lesion shows thin peripheral multilobular contrast enhancement extending from the cortical surface to the right atrium subependymal region with extension of the hemorrhage into the right lateral ventricle. Prominent vasogenic edema surrounding the lesion and extending into the right parietal and temporal lobes with mass effect on the occipital horn and atrium of the right lateral ventricle. No hydrocephalus or significant midline shift. No other enhancing lesion identified. Scattered foci of T2 hyperintensity are seen within the white matter of the cerebral hemispheres, nonspecific, most likely related to chronic small vessel ischemia. Vascular: Normal flow voids. Skull and upper cervical spine: Normal marrow signal. Sinuses/Orbits: Negative. 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 Electronically Signed   By: Pedro Earls M.D.   On: 06/30/2020 14:54   IR IVC FILTER PLMT / S&I Burke Keels GUID/MOD SED  Result Date: 07/05/2020 CLINICAL DATA:  80 year old male with history of subclinical pulmonary emboli and brain mass with forthcoming neuro surgical intervention EXAM: 1. ULTRASOUND GUIDANCE FOR VASCULAR ACCESS OF THE RIGHT  VEIN. 2. IVC VENOGRAM. 3. PERCUTANEOUS IVC FILTER PLACEMENT. ANESTHESIA/SEDATION: One mg IV Versed; 25 mcg IV Fentanyl. Total Moderate Sedation Time Tenminutes. CONTRAST:  77mL OMNIPAQUE IOHEXOL 300 MG/ML  SOLN FLUOROSCOPY TIME:  54 seconds, 98 mGy PROCEDURE: The procedure, risks, benefits, and alternatives were explained to the patient. Questions regarding the procedure were encouraged and answered. The patient understands and consents to the procedure. The patient was prepped with Betadine in a sterile fashion, and a sterile drape was applied covering the operative field. A sterile gown and sterile gloves were used for the procedure. Local anesthesia was provided with 1% Lidocaine. Under direct ultrasound guidance, a 21 gauge needle was advanced into the right internal jugular vein with ultrasound image documentation performed. After securing access with a micropuncture dilator, a guidewire was advanced into the inferior vena cava. A deployment sheath was advanced over the guidewire. This was utilized to perform IVC venography. The deployment sheath was further positioned in an appropriate location for filter deployment. A Denali IVC filter was then advanced in the sheath. This was then fully deployed in the infrarenal IVC. Final filter position was confirmed with a fluoroscopic spot image. Contrast injection was also  performed through the sheath under fluoroscopy to confirm patency of the IVC at the level of the filter. After the procedure the sheath was removed and hemostasis obtained with  manual compression. COMPLICATIONS: None. FINDINGS: IVC venography demonstrates a normal caliber IVC with no evidence of thrombus. Renal veins are identified bilaterally. The IVC filter was successfully positioned below the level of the renal veins and is appropriately oriented. This IVC filter has both permanent and retrievable indications. IMPRESSION: Placement of percutaneous IVC filter in infrarenal IVC. IVC venogram shows no evidence of IVC thrombus and normal caliber of the inferior vena cava. This filter does have both permanent and retrievable indications. PLAN: This IVC filter is potentially retrievable. The patient will be assessed for filter retrieval by Interventional Radiology in approximately 8-12 weeks. Further recommendations regarding filter retrieval, continued surveillance or declaration of device permanence, will be made at that time. Ruthann Cancer, MD [facility] Electronically Signed   By: Ruthann Cancer MD   On: 07/05/2020 15:23   CT CHEST ABDOMEN PELVIS W CONTRAST  Result Date: 06/28/2020 CLINICAL DATA:  Brain lesion, assess for metastatic disease EXAM: CT CHEST, ABDOMEN, AND PELVIS WITH CONTRAST TECHNIQUE: Multidetector CT imaging of the chest, abdomen and pelvis was performed following the standard protocol during bolus administration of intravenous contrast. CONTRAST:  166mL OMNIPAQUE IOHEXOL 300 MG/ML  SOLN COMPARISON:  CT angiography 05/27/2020, CT abdomen and pelvis 04/27/2012 FINDINGS: CT CHEST FINDINGS Cardiovascular: Normal heart size. No pericardial effusion. Coronary artery calcifications are present. The aortic root is suboptimally assessed given cardiac pulsation artifact. Atherosclerotic plaque within the normal caliber aorta and proximal great vessels. No acute luminal abnormality of the imaged aorta. No periaortic stranding or hemorrhage. Normal 3 vessel branching of the aortic arch. Proximal great vessels are free of  acute abnormality. Central pulmonary arteries are normal caliber. Linear hypodense filling defect in the distal left main pulmonary artery at the bifurcation of the lingular and left lower lobar arteries (3/27) several additional smaller possible filling defects are seen pulmonary arteries of the left lower lobe (3/34) and right lower lobe (3/31). Truncation of the right posterior segmental pulmonary artery as it enters the region of the heterogeneous attenuating masslike opacity in the right upper lobe. Additional narrowing of the adjacent pulmonary veins and hilar airways as well. Central pulmonary arteries remain normal caliber. RV/LV ratio is maintained. Mediastinum/Nodes: Mass within the right upper with extension along the right superior mediastinal margin extending into the right hilum and subcarinal space. There is associated narrowing of the superior hilar airways and vessels. A right superior hilar deposit measures up to 12 mm in size, previously 9 mm (3/23) increasing subcarinal extension as well narrowing the right mainstem bronchus with a subcarinal deposit measures approximately 2.3 cm on today's exam, previously 2.1 cm. No contralateral hilar adenopathy. The right lateral margin of the midthoracic esophagus is difficult to separate from this soft tissue mass. Lungs/Pleura: Large area of masslike opacity in the right upper lobe with extension along the right superior mediastinal margin and into the hilum and subcarinal space. This measures approximately 6.4 x 8 cm, previously 5.2 x 6.3 cm at similar levels. There is decreasing central cavitation now with a slightly more hypoattenuating necrotic appearance on today's exam. Associated narrowing of the hilar airways and vessels including some occlusion of the upper lobe airways centrally. Surrounding ground-glass and consolidative opacity may reflect some postobstructive pneumonia change. Some additional more patchy ground-glass opacities in the right  upper lobe  periphery are similar to prior. Stable 4 mm subpleural nodules seen in the lateral and posterior right lower lobe. Remaining portions of the lungs are clear. No pneumothorax or pleural effusion. Minimal atelectasis. Musculoskeletal: No worrisome chest wall mass or lesion. No direct extension into the chest wall or adjacent osseous structures by the upper lobe mass. Multilevel degenerative changes are present in the imaged portions of the spine. Exaggerated thoracic kyphosis with some degenerative vertebral body height loss and multilevel Schmorl's node formations. No acute osseous abnormality or suspicious osseous lesion. CT ABDOMEN PELVIS FINDINGS Hepatobiliary: No worrisome focal liver lesions. Smooth liver surface contour. Normal hepatic attenuation. Normal gallbladder and biliary tree. Pancreas: No pancreatic ductal dilatation or surrounding inflammatory changes. 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 Adrenals/Urinary Tract: Hypoattenuating nodule in the medial limb of the right adrenal gland again measures up to 1 cm in size, unchanged since 2014 and strongly favoring a benign process such as adenoma rather than metastatic lesion. No concerning right adrenal lesion. Stable appearance of the multiple fluid attenuation cyst in a slightly more intermediate attenuation exophytic cyst arising from the lower pole left kidney. Mild symmetric bilateral perinephric stranding, a nonspecific finding which may correlate with advanced age or decreased renal function. No concerning renal mass, obstructive urolith or hydronephrosis. Multiple coarse calcifications layering dependently within the bladder may reflect bladder calculi though these were not present on comparison imaging. There is however some chronic indentation of the urinary bladder by an  enlarged, heterogeneous prostate gland. Stomach/Bowel: Distal esophagus, stomach and duodenal sweep are unremarkable. No small bowel wall thickening or dilatation. No evidence of obstruction. A normal appendix is visualized. No colonic dilatation or wall thickening. Vascular/Lymphatic: Atherosclerotic calcifications within the abdominal aorta and branch vessels. No aneurysm or ectasia. No enlarged abdominopelvic lymph nodes. Reproductive: Heterogeneous, enlarged prostate with few typically benign prostate calcifications. Indentation of the bladder base. Other: No abdominopelvic free fluid or free gas. No bowel containing hernias. Musculoskeletal: Multilevel degenerative changes are present in the imaged portions of the spine. Additional degenerative changes in the hips and pelvis. The osseous structures appear diffusely demineralized which may limit detection of small or nondisplaced fractures. No acute osseous abnormality or suspicious osseous lesion. IMPRESSION: 1. Large area of masslike opacity in the right upper lobe with extension along the right superior mediastinal margin and into the right hilum and subcarinal space. This measures approximately 6.4 x 8 cm, previously 5.2 x 6.3 cm at similar levels. There is decreasing central cavitation now with a slightly more hypoattenuating necrotic appearance on today's exam. Findings are concerning for primary lung malignancy, particularly given the findings in the brain. Necrotic/atypical infection is less favored. Consider further evaluation with tissue sampling potentially via bronchoscopy. 2. Increasing subcarinal and right hilar extension with narrowing of the superior hilar airways and vessels including some occlusion of the right upper lobe airways. The right lateral margin of the midthoracic esophagus is difficult to separate from this soft tissue mass. 3. Some more peripheral areas of patchy consolidation interstitial opacity throughout the right upper lobe  likely reflects a postobstructive process 4. Linear hypodense filling defect in the distal left main pulmonary artery at the bifurcation of the lingular and left lower lobar arteries, several additional smaller possible filling defects are seen pulmonary arteries of the left lower lobe and right lower lobe (3/34) and right lower lobe (3/31). Findings are concerning  for pulmonary emboli. No CT evidence of right heart strain within limitations of this exam. 5. Multiple coarse calcifications layering dependently within the bladder may reflect bladder calculi though these were not present on comparison imaging. No obstructive urolithiasis or hydronephrosis. 6. Heterogeneous, enlarged prostate gland with some chronic indentation of the urinary bladder base. Could correlate for features of outlet obstruction with urinalysis were clinically appropriate. Furthermore recommend assessment with prostate exam and PSA as warranted. 7. No other convincing evidence of primary malignancy or metastatic disease within the abdomen or pelvis. Electronically Signed   By: Lovena Le M.D.   On: 06/28/2020 01:40   DG Chest Portable 1 View  Result Date: 06/26/2020 CLINICAL DATA:  Fever, chills, weakness, cough EXAM: PORTABLE CHEST 1 VIEW COMPARISON:  05/27/2020 FINDINGS: Consolidation again seen in the right upper lobe compatible with pneumonia. This has improved since prior study. No confluent opacity on the left. Heart is normal size. Aortic atherosclerosis. No effusions. No acute bony abnormality. IMPRESSION: Continued but improving right upper lobe consolidation/pneumonia. Electronically Signed   By: Rolm Baptise M.D.   On: 06/26/2020 20:27   VAS [name] LOWER EXTREMITY VENOUS (DVT)  Result Date: 06/28/2020  Lower Venous DVT Study Indications: Pulmonary embolism.  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 Comparison Study: No prior venous studies available. Performing Technologist: Darlin Coco RDMS,RVT  Examination Guidelines: A  complete evaluation includes B-mode imaging, spectral Doppler, color Doppler, and power Doppler as needed of all accessible portions of each vessel. Bilateral testing is considered an integral part of a complete examination. Limited examinations for reoccurring indications may be performed as noted. The reflux portion of the exam is performed with the patient in reverse Trendelenburg.  +---------+---------------+---------+-----------+----------+--------------+ RIGHT    CompressibilityPhasicitySpontaneityPropertiesThrombus Aging +---------+---------------+---------+-----------+----------+--------------+ CFV      Full           Yes      Yes                                 +---------+---------------+---------+-----------+----------+--------------+ SFJ      Full                                                        +---------+---------------+---------+-----------+----------+--------------+ FV Prox  Full                                                        +---------+---------------+---------+-----------+----------+--------------+ FV Mid   Full                                                        +---------+---------------+---------+-----------+----------+--------------+ FV DistalFull                                                        +---------+---------------+---------+-----------+----------+--------------+ PFV  Full                                                        +---------+---------------+---------+-----------+----------+--------------+ POP      Full           Yes      Yes                                 +---------+---------------+---------+-----------+----------+--------------+ PTV      Full                                                        +---------+---------------+---------+-----------+----------+--------------+ PERO     Full                                                         +---------+---------------+---------+-----------+----------+--------------+   +---------+---------------+---------+-----------+----------+--------------+ LEFT     CompressibilityPhasicitySpontaneityPropertiesThrombus Aging +---------+---------------+---------+-----------+----------+--------------+ CFV      Full           Yes      Yes                                 +---------+---------------+---------+-----------+----------+--------------+ SFJ      Full                                                        +---------+---------------+---------+-----------+----------+--------------+ FV Prox  Full                                                        +---------+---------------+---------+-----------+----------+--------------+ FV Mid   Full                                                        +---------+---------------+---------+-----------+----------+--------------+ FV DistalFull                                                        +---------+---------------+---------+-----------+----------+--------------+ PFV      Full                                                        +---------+---------------+---------+-----------+----------+--------------+  POP      Full           Yes      Yes                                 +---------+---------------+---------+-----------+----------+--------------+ PTV                                                   BKA            +---------+---------------+---------+-----------+----------+--------------+ PERO                                                  BKA            +---------+---------------+---------+-----------+----------+--------------+     Summary: RIGHT: - There is no evidence of deep vein thrombosis in the lower extremity.  - No cystic structure found in the popliteal fossa.  LEFT: - There is no evidence of deep vein thrombosis in the lower extremity.  - No cystic structure found in the popliteal fossa.   *See table(s) above for measurements and observations. Electronically signed by Deitra Mayo MD on 06/28/2020 at 3:46:57 PM.    Final    Assessment and Plan:  This is a 80 year old male with progressive weakness, loss of appetite, and cough with hemoptysis who presented with a right lung mass and occipital brain lesion.  1.  Right lung mass with brain lesion highly concerning for metastatic lung cancer 2.  Pulmonary embolism 3.  Microcytic anemia 4.  Leukocytosis secondary to steroids 5.  PAD status post left BKA 6.  Hypertension 7.  Uncontrolled diabetes mellitus 8.  GERD 9.  Status post splenectomy  -Discussed imaging findings with the patient today.  We discussed that findings are concerning for metastatic lung cancer.  His biopsy results are currently pending.  We will follow up on these results once available. -Assuming biopsy consistent with cancer, we discussed treatment options including radiation to his brain and lung mass.  We also discussed systemic treatment options including chemotherapy, immunotherapy, and targeted therapy.  We will request foundation 1 testing once biopsy results are available. -The patient currently lives in [place] and would prefer to get his treatment closer to home.  We discussed that medical oncology is available at [facility].  However, he would need to come to [facility] for radiation treatment.  We will make these arrangements once final pathology available. -Taper dexamethasone per neurosurgery. -The patient is noted to have microcytic anemia.  He had iron studies performed on 3/22 which are consistent with iron deficiency. ?d/t hemoptysis. Recommend a dose of Feraheme 510 mg IV.  Thank you for this referral.   Mikey Bussing, DNP, AGPCNP-BC, AOCNP  Addendum  I have seen the patient, examined him. I agree with the assessment and and plan and have edited the notes.   Mr. Thomas Garcia is a 80 year old gentleman with past medical history  of hypertension, poorly controlled diabetes, peripheral vascular disease, history of splenectomy and left BKA, presented with intermittent hemoptysis, loss of appetite and general weakness. 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  Patient would likely benefit from palliative radiation to brain surgical bed and possible the primary tumor in the right upper lobe which has caused significant narrowing of right bronchus, although he is not shortness of breath or oxygen.  Patient states that he is not very interested in prolong his life, but will following our recommendation for cancer treatment.  He would like to have care closer to home, will set up patient to see Dr. Delton Coombes in [facility].  I briefly reviewed this the treatment options including chemo, immunotherapy and possible targeted therapy if he has targetable gene mutations.  We will request genomic testing foundation one on his surgical sample. All questions were answered.   Truitt Merle  07/11/2020

## 2020-07-11 NOTE — Progress Notes (Signed)
Physical Therapy Treatment Patient Details Name: Thomas Garcia MRN: [id number] DOB: Dec 23, 1940 Today's Date: 07/11/2020    History of Present Illness 80 yo male presenting 3/20 with c/o general weakness, fatigue, nausea, and loss of appetite. Upon work-up, pt found to have right occipital cortex with hemorrhage with 11mm midline shift. MRI on 3/23 showed "large multilobulated hemorrhagic lesion in right occipital lobe". S/p R occipital craniotomy for resection of mass 4/1. PMH includes: lung cancer, HTN, DM II, PAD, L BKA.    PT Comments    The pt was seen for continued progression of LE strengthening for OOB transfers and general stability with seated position and during transfers. The pt was able to demo multiple exercises with good technique when cued, but states he is experiencing memory issues that require written instructions for him to recall exercises (written exercise program provided). The pt states he is still able to transfer without any assist, but per RN staff it took +2 to complete stand-pivot to [facility], and it required min-modA to complete lateral scoot bed-BSC during session. Due to increased need for assistance with transfers, will continue to benefit from skilled PT to progress functional LE strength, core strength and stability, and progress independence with transfers through continued PT acutely and HHPT following d/c.    Follow Up Recommendations  Home health PT;Supervision - Intermittent     Equipment Recommendations  None recommended by PT (pt well equipped)    Recommendations for Other Services       Precautions / Restrictions Precautions Precautions: Fall Precaution Comments: L BKA at baseline Restrictions Weight Bearing Restrictions: No    Mobility  Bed Mobility Overal bed mobility: Needs Assistance             General bed mobility comments: Pt OOB in recliner at start and end of session    Transfers Overall transfer level: Needs  assistance Equipment used: None Transfers: Lateral/Scoot Transfers;Sit to/from Stand Sit to Stand: Min guard (with bed rail for BUE support)        Lateral/Scoot Transfers: Min assist;Mod assist General transfer comment: minA to modA to comlpete lateral scoot with intermittent cues for hand positioning and technique. pt completed x1 going to R with minA and x1 going to L requiring modA and increased cues for hand placement  Ambulation/Gait             General Gait Details: deferred, pt reports WC level at baseline         Balance Overall balance assessment: Mild deficits observed, not formally tested                                          Cognition Arousal/Alertness: Awake/alert Behavior During Therapy: WFL for tasks assessed/performed Overall Cognitive Status: Impaired/Different from baseline Area of Impairment: Attention;Safety/judgement;Awareness;Problem solving;Memory                   Current Attention Level: Sustained Memory: Decreased short-term memory Following Commands: Follows one step commands with increased time Safety/Judgement: Decreased awareness of safety;Decreased awareness of deficits Awareness: Emergent Problem Solving: Requires verbal cues;Requires tactile cues;Slow processing General Comments: pt with calm and cooperative demeanor. continues to make some jokes (both appropriate and inappropriate at times), pt perseverating on losing both HEP sheet and napkin with his phone number on it. Pt with good awareness that he does not remember education or exercises and needs written information.  Pt with decreased insight to deficits, stating he is mobilizing without assist for lateral scoot transfers, RN states it took assist of 2.      Exercises General Exercises - Lower Extremity Gluteal Sets: AROM;Both Long Arc Quad: Strengthening;Right;10 reps Heel Slides: AROM;Right;10 reps;Seated Hip Flexion/Marching: AROM;Both;10  reps;Seated Heel Raises: AROM;10 reps;Seated Other Exercises Other Exercises: sit-stand from recliner with BUE on bed rail    General Comments General comments (skin integrity, edema, etc.): Pt reports losing HEP when changing rooms, new HEP provided. reports slight headache that did not change through session.      Pertinent Vitals/Pain Pain Assessment: Faces Faces Pain Scale: Hurts a little bit Pain Location: buttocks Pain Descriptors / Indicators: Sore ("raw") Pain Intervention(s): Monitored during session           PT Goals (current goals can now be found in the care plan section) Acute Rehab PT Goals Patient Stated Goal: return home PT Goal Formulation: With patient Time For Goal Achievement: 07/22/20 Potential to Achieve Goals: Good Progress towards PT goals: Progressing toward goals    Frequency    Min 3X/week      PT Plan Current plan remains appropriate       AM-PAC PT "6 Clicks" Mobility   Outcome Measure  Help needed turning from your back to your side while in a flat bed without using bedrails?: None Help needed moving from lying on your back to sitting on the side of a flat bed without using bedrails?: None Help needed moving to and from a bed to a chair (including a wheelchair)?: A Little Help needed standing up from a chair using your arms (e.g., wheelchair or bedside chair)?: A Little Help needed to walk in hospital room?: Total Help needed climbing 3-5 steps with a railing? : Total 6 Click Score: 16    End of Session Equipment Utilized During Treatment: Gait belt Activity Tolerance: Patient tolerated treatment well Patient left: in chair;with call bell/phone within reach;with chair alarm set Nurse Communication: Mobility status PT Visit Diagnosis: Other abnormalities of gait and mobility (R26.89);Unsteadiness on feet (R26.81)     Time: 4827-0786 PT Time Calculation (min) (ACUTE ONLY): 46 min  Charges:  $Therapeutic Exercise: 23-37  mins $Therapeutic Activity: 8-22 mins                     Karma Ganja, PT, DPT   Acute Rehabilitation Department Pager #: [phone]   Otho Bellows 07/11/2020, 12:57 PM

## 2020-07-12 ENCOUNTER — Other Ambulatory Visit: Payer: Self-pay | Admitting: Radiation Therapy

## 2020-07-12 LAB — GLUCOSE, CAPILLARY
Glucose-Capillary: 62 mg/dL — ABNORMAL LOW (ref 70–99)
Glucose-Capillary: 92 mg/dL (ref 70–99)
Glucose-Capillary: 83 mg/dL (ref 70–99)
Glucose-Capillary: 127 mg/dL — ABNORMAL HIGH (ref 70–99)
Glucose-Capillary: 93 mg/dL (ref 70–99)

## 2020-07-12 MED ORDER — HEPARIN SODIUM (PORCINE) 5000 UNIT/ML IJ SOLN
5000.0000 [IU] | Freq: Three times a day (TID) | INTRAMUSCULAR | Status: DC
  Administered 2020-07-12 – 2020-07-14 (×6): 5000 [IU] via SUBCUTANEOUS
  Filled 2020-07-12 (×6): qty 1

## 2020-07-12 MED ORDER — DEXAMETHASONE 2 MG PO TABS: ORAL_TABLET | ORAL | 0 refills | Status: AC

## 2020-07-12 MED ORDER — INSULIN GLARGINE 100 UNIT/ML ~~LOC~~ SOLN
15.0000 [IU] | Freq: Two times a day (BID) | SUBCUTANEOUS | Status: DC
  Filled 2020-07-12 (×4): qty 0.15

## 2020-07-12 MED ORDER — DEXAMETHASONE 4 MG PO TABS
2.0000 mg | ORAL_TABLET | Freq: Two times a day (BID) | ORAL | Status: DC
  Administered 2020-07-12 – 2020-07-14 (×4): 2 mg via ORAL
  Filled 2020-07-12 (×4): qty 1

## 2020-07-12 MED ORDER — LEVETIRACETAM 500 MG PO TABS: 500.0000 mg | ORAL_TABLET | Freq: Two times a day (BID) | ORAL | 0 refills | Status: DC

## 2020-07-12 NOTE — Progress Notes (Signed)
Occupational Therapy Treatment Patient Details Name: Thomas Garcia MRN: [id number] DOB: [birth year] Today's Date: 07/12/2020    History of present illness 80 yo male presenting 3/20 with c/o general weakness, fatigue, nausea, and loss of appetite. Upon work-up, pt found to have right occipital cortex mass with hemorrhage with 78mm midline shift. MRI on 3/23 showed "large multilobulated hemorrhagic lesion in right occipital lobe". S/p R occipital craniotomy for resection of mass 4/1. PMH includes: lung cancer, HTN, DM II, PAD, L BKA.   OT comments  Pt with visual and cognitive deficits that will require a caregiver at home for safety and transfers. Pt lacks awareness to deficits. Pt reports he could read a clock prior to this admission. Pt unable to read 2 o clock even when given all the information that does not require visual input. Recommendation for HHOT if family can (A) .    Follow Up Recommendations  Home health OT;Supervision/Assistance - 24 hour    Equipment Recommendations  None recommended by OT    Recommendations for Other Services PT consult    Precautions / Restrictions Precautions Precautions: Fall Precaution Comments: L BKA at baseline Restrictions Weight Bearing Restrictions: No       Mobility Bed Mobility Overal bed mobility: Needs Assistance Bed Mobility: Supine to Sit Rolling: Supervision;Min assist   Supine to sit: Supervision Sit to supine: Supervision   General bed mobility comments: sitting eob for vision assessment    Transfers                 General transfer comment: not attempted as session focused on cognitiona nd vision    Balance Overall balance assessment: Mild deficits observed, not formally tested                                         ADL either performed or assessed with clinical judgement   ADL                                               Vision   Vision Assessment?:  Yes Additional Comments: pt provided a sheet and asked to cut all lines present in half and pt shows a R bias. pt asked to mark out objects in a sheet of random assignment and missing objects in lower L quadrant and finding them on 3rd pass visual scanning. pt asked to write the numbers and hands on a clock and pt draws 11 10 and circles them so the clock looked like a face instead. Pt asked questions with visual occlusion and pt reports pt face missing from eyes down and when asked secondary questions confirms the reason the face is missing is due to mask covering it. Pt taking OT hands and covering his eyes - Pt states "it might just be me but i cant see a thing now" pt was assured this was expected as his vision was completely occluded ( covered eyes) . pt thanking therapist for being so honest   Perception     Praxis      Cognition Arousal/Alertness: Awake/alert Behavior During Therapy: WFL for tasks assessed/performed Overall Cognitive Status: Impaired/Different from baseline Area of Impairment: Orientation;Attention;Memory;Following commands;Safety/judgement;Awareness                 Orientation Level:  Disoriented to;Situation Current Attention Level: Sustained Memory: Decreased recall of precautions;Decreased short-term memory Following Commands: Follows one step commands consistently;Follows multi-step commands inconsistently Safety/Judgement: Decreased awareness of deficits Awareness: Intellectual Problem Solving: Slow processing;Difficulty sequencing General Comments: pt lacks awareness to reason for admission. pt giving multiple reasons for surgery but none are correct. pt lacks awareness of visual changes. pt unable to problem solve why objects are different see vision section        Exercises General Exercises - Lower Extremity Hip ABduction/ADduction: AROM;Both;15 reps;Supine Straight Leg Raises: AROM;Both;15 reps;Supine   Shoulder Instructions       General  Comments      Pertinent Vitals/ Pain       Pain Assessment: No/denies pain Faces Pain Scale: Hurts little more Pain Location: buttocks, scrotum Pain Descriptors / Indicators: Sore;Discomfort;Grimacing Pain Intervention(s): Limited activity within patient's tolerance;Monitored during session;Repositioned  Home Living                                          Prior Functioning/Environment              Frequency  Min 2X/week        Progress Toward Goals  OT Goals(current goals can now be found in the care plan section)  Progress towards OT goals: Progressing toward goals  Acute Rehab OT Goals Patient Stated Goal: return home OT Goal Formulation: With patient Time For Goal Achievement: 07/15/20 Potential to Achieve Goals: Good ADL Goals Pt Will Perform Lower Body Dressing: with modified independence;sitting/lateral leans;bed level Pt Will Transfer to Toilet: with modified independence;squat pivot transfer;bedside commode Pt Will Perform Toileting - Clothing Manipulation and hygiene: with modified independence;sitting/lateral leans;sit to/from stand Additional ADL Goal #1: Pt will verbalize three compensatory techniques for visual deficits with Min cues  Plan Discharge plan remains appropriate;Frequency remains appropriate    Co-evaluation                 AM-PAC OT "6 Clicks" Daily Activity     Outcome Measure   Help from another person eating meals?: None Help from another person taking care of personal grooming?: A Little Help from another person toileting, which includes using toliet, bedpan, or urinal?: A Little Help from another person bathing (including washing, rinsing, drying)?: A Little Help from another person to put on and taking off regular upper body clothing?: None Help from another person to put on and taking off regular lower body clothing?: A Little 6 Click Score: 20    End of Session    OT Visit Diagnosis: Other  abnormalities of gait and mobility (R26.89);Unsteadiness on feet (R26.81);Muscle weakness (generalized) (M62.81)   Activity Tolerance Patient tolerated treatment well   Patient Left in bed;with call bell/phone within reach;with bed alarm set   Nurse Communication Mobility status        Time: 1341-1410 OT Time Calculation (min): 29 min  Charges: OT General Charges $OT Visit: 1 Visit OT Treatments $Self Care/Home Management : 8-22 mins $Cognitive Funtion inital: Initial 15 mins   Brynn, OTR/L  Acute Rehabilitation Services Pager: [phone] Office: [phone] .    Jeri Modena 07/12/2020, 3:46 PM

## 2020-07-12 NOTE — Progress Notes (Signed)
Hypoglycemic Event  CBG: 62  Treatment: hypoglycemia standing orders, 4oz OJ given PO    Symptoms: none   Follow-up CBG: [id number] CBG Result:92  Possible Reasons for Event:   Comments/MD notified: standing orders effective    Thomas Garcia

## 2020-07-12 NOTE — Progress Notes (Signed)
Physical Therapy Treatment Patient Details Name: Thomas Garcia MRN: [id number] DOB: 05/02/1940 Today's Date: 07/12/2020    History of Present Illness 80 yo male presenting 3/20 with c/o general weakness, fatigue, nausea, and loss of appetite. Upon work-up, pt found to have right occipital cortex mass with hemorrhage with 27mm midline shift. MRI on 3/23 showed "large multilobulated hemorrhagic lesion in right occipital lobe". S/p R occipital craniotomy for resection of mass 4/1. PMH includes: lung cancer, HTN, DM II, PAD, L BKA.    PT Comments    Pt not eager to mobilize upon PT arrival to room, but agreeable with encouragement. Pt soiled in stool and urine, pt stating he was unaware of this, pointed to his head, and stated "this thing is going". Pt requires step-by-step cuing for all mobility tasks today, and declines OOB attempts. Pt tolerated supine LE exercises well, but has limited tolerance. PT recommending palliative consult, and may need SNF level of care post-acutely given PT concerns about pt's ability to care for self and poor mobility. Will continue to follow.     Follow Up Recommendations  Home health PT;Supervision - Intermittent (Palliative consult, possible SNF placement given cognition)     Equipment Recommendations  None recommended by PT (pt well equipped)    Recommendations for Other Services       Precautions / Restrictions Precautions Precautions: Fall Precaution Comments: L BKA at baseline Restrictions Weight Bearing Restrictions: No    Mobility  Bed Mobility Overal bed mobility: Needs Assistance Bed Mobility: Supine to Sit;Sit to Supine;Rolling Rolling: Supervision;Min assist   Supine to sit: Supervision Sit to supine: Supervision   General bed mobility comments: supervision for rolling bilaterally and pull to sit/return to supine during linen change. Pt requires step-by-step cuing for mobility, and at times min PT assist to maintain roll for pericare.     Transfers                 General transfer comment: pt declines  Ambulation/Gait                 Stairs             Wheelchair Mobility    Modified Rankin (Stroke Patients Only)       Balance Overall balance assessment: Mild deficits observed, not formally tested                                          Cognition Arousal/Alertness: Awake/alert Behavior During Therapy: Restless Overall Cognitive Status: Impaired/Different from baseline Area of Impairment: Attention;Safety/judgement;Awareness;Problem solving;Memory                 Orientation Level: Disoriented to;Situation Current Attention Level: Sustained Memory: Decreased short-term memory Following Commands: Follows one step commands with increased time;Follows one step commands inconsistently Safety/Judgement: Decreased awareness of safety;Decreased awareness of deficits Awareness: Emergent Problem Solving: Requires verbal cues;Requires tactile cues;Slow processing General Comments: Pt states "oh come on, I've been doing so much therapy" upon PT arrival to room. Pt completely soiled in both urine and stool upon PT arrival to room, pt unaware of this and initially states "just leave it, we're not changing the sheets" but agreeable to linen change with PT encouragement. Pt follows mobility commands with increased time, and at times requires alternate explanation and/or demonstration to understand      Exercises General Exercises - Lower Extremity Hip ABduction/ADduction: AROM;Both;15  reps;Supine Straight Leg Raises: AROM;Both;15 reps;Supine    General Comments        Pertinent Vitals/Pain Pain Assessment: Faces Faces Pain Scale: Hurts little more Pain Location: buttocks, scrotum Pain Descriptors / Indicators: Sore;Discomfort;Grimacing Pain Intervention(s): Limited activity within patient's tolerance;Monitored during session;Repositioned    Home Living                       Prior Function            PT Goals (current goals can now be found in the care plan section) Acute Rehab PT Goals Patient Stated Goal: return home PT Goal Formulation: With patient Time For Goal Achievement: 07/22/20 Potential to Achieve Goals: Good Progress towards PT goals: Progressing toward goals    Frequency    Min 3X/week      PT Plan Discharge plan needs to be updated    Co-evaluation              AM-PAC PT "6 Clicks" Mobility   Outcome Measure  Help needed turning from your back to your side while in a flat bed without using bedrails?: A Little Help needed moving from lying on your back to sitting on the side of a flat bed without using bedrails?: A Little Help needed moving to and from a bed to a chair (including a wheelchair)?: A Little Help needed standing up from a chair using your arms (e.g., wheelchair or bedside chair)?: A Little Help needed to walk in hospital room?: Total Help needed climbing 3-5 steps with a railing? : Total 6 Click Score: 14    End of Session Equipment Utilized During Treatment: Gait belt Activity Tolerance: Patient tolerated treatment well Patient left: with call bell/phone within reach;in bed;with bed alarm set Nurse Communication: Mobility status;Other (comment) (complete linen change, requesting palliative consult) PT Visit Diagnosis: Other abnormalities of gait and mobility (R26.89);Unsteadiness on feet (R26.81)     Time: 1131-1200 PT Time Calculation (min) (ACUTE ONLY): 29 min  Charges:  $Therapeutic Activity: 23-37 mins                     Stacie Glaze, PT Acute Rehabilitation Services Pager [phone]  Office [phone]    Roxine Caddy E Ruffin Pyo 07/12/2020, 1:29 PM

## 2020-07-12 NOTE — Progress Notes (Signed)
   Providing Compassionate, Quality Care - Together  NEUROSURGERY PROGRESS NOTE   S: No issues overnight.  Denies any headache  O: EXAM:  BP (!) 157/67 (BP Location: Right Arm)   Pulse 68   Temp 97.7 F (36.5 C) (Oral)   Resp 18   Ht 5\' 9"  (1.753 m)   Wt 81.6 kg   SpO2 98%   BMI 26.58 kg/m   Awake, alert,oriented x3 Speech appropriate FCx4 Face symmetric  PERRL EOMI, L hemianopsia 5/5 BUE/BLE Incision c/d/i  ASSESSMENT:  80 y.o. male with  1. Right occipital tumor, prelim metastatic  -s/p R crani for resection 07/08/2020  PLAN: -rehab pending -dex taper over 1 week -pain control -Subcu heparin okay -if the patient needs to resume Plavix or needs full anticoagulation for small for PE he may undergo full anticoagulation at 2 weeks postoperative.    Thank you for allowing me to participate in this patient's care.  Please do not hesitate to call with questions or concerns.   Elwin Sleight, [facility] Cell: [phone]

## 2020-07-12 NOTE — Progress Notes (Signed)
Thomas Garcia  NUU:[id number] DOB: 1940/05/17 DOA: 06/26/2020 PCP: Doree Albee, MD    Brief Narrative:  80yo with a hx of HTN, DM 2, and PAD who presented to the ER with severe generalized weakness and fatigue, nausea, loss of appetite, and scant hemoptysis. 1 month prior he presented to an ER with shortness of breath and 6 months of hemoptysis and was found to have small bilateral pulmonary emboli as well as a masslike consolidation in the right upper lobe with cavitation.  He refused admission at that time.  Upon his return to the ER for this admission a CT head revealed evidence of a right occipital mass.  3/29 IVC filter placed 4/1 right occipital craniotomy for resection of mass  Antimicrobials:  Rocephin 3/21 >3/25  DVT prophylaxis: Subcutaneous heparin  Consultants:  Pulmonary Neurosurgery Oncology  Subjective: He feels weak. He does complain of some headache.  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 If he has recurrence of gross hemoptysis, he would need IR directed embolization  Right occipital mass -suspected metastatic lung cancer Complicated by mild hemorrhage and vasogenic edema resulting in a 2 mm right-to-left shift - Neurosurgery following - continue decadron and empiric keppra - s/p stereotactic right occipital craniotomy for resection of mass - post op care per neurosurgery - follow up pathology- Seen by oncology who will arrange appropriate follow up and further testing. Would likely be a candidate for palliative radiation to brain surgical bed as well as RUL of lung where mass is causing some compression. Neurosurgery has prescribed dexamethasone taper and course of keppra.  Pulmonary embolism noted on CT chest Full anticoagulation on hold in setting of hemoptysis and bleeding from  brain mass - no evidence of DVT on bilateral lower extremity venous duplex - began SQ heparin at prophy dose 3/25 - monitor closely for bleeding - IR placed a retrievable IVC filter 3/29 - Currently breathing comfortably on room air.  Discussed with pulmonology regarding anticoagulation, now that hemoptysis has resolved and brain mass has been resected. Per Dr. Tacy Learn, since patient is currently on room air, breathing comfortably and has IVC filter, decision on anticoagulation can be deferred until he follows up with pulmonology post discharge.  Chronic iron deficiency anemia Likely a consequence of his previously undiagnosed cancer - He did have some bleeding during surgery and was transfused 1 unit prbc - follow up hemglobin 10.3  PAD Status post left BKA - Plavix necessarily on hold due to above - continue statin - followed by Dr. Carlis Abbott -Per Dr. Reatha Armour, will wait at least 2 weeks before resuming antiplatelet agents. Could potentially resume plavix on 07/22/20  HTN Blood pressure stable-adjust medical therapy and follow  Uncontrolled DM 2 w/ hyperglycemia A1c 6.4. Patient was not on insulin PTA. Due to high dose steroids and associated hyperglycemia, he was started on basal/bolus insulin. Now that steroids are being tapered, he is having episodes of hypoglycemia and insulin is being titrated down. Since he is being discharged home on dexamethasone, I suspect he may need to discharge on insulin. His son will be coming in tomorrow for insulin teaching. Continue to follow blood sugars.  GERD Well compensated  Hyponatremia Corrected  Generalized weakness -PT/OT, needs HH PT/OT  Goals of care -discussed code status with patient and his son -both agreed that he would want to be DNR Girtha Rm DNR  form filled out and placed in chart for patient to take home on discharge  Code Status: DNR Family Communication: No family present at time of exam, updated son, Ed, over the phone 4/5 Status is:  Inpatient  Remains inpatient appropriate because:Inpatient level of care appropriate due to severity of illness   Dispo: The patient is from: Home              Anticipated d/c is to: Home              Patient currently is not medically stable to d/c.   Difficult to place patient No     Objective: Blood pressure (!) 157/67, pulse 68, temperature 97.7 F (36.5 C), temperature source Oral, resp. rate 18, height 5\' 9"  (1.753 m), weight 81.6 kg, SpO2 98 %.  Intake/Output Summary (Last 24 hours) at 07/12/2020 1055 Last data filed at 07/11/2020 1607 Gross per 24 hour  Intake 480 ml  Output 300 ml  Net 180 ml   Filed Weights   06/26/20 1856  Weight: 81.6 kg    Examination: General exam: Alert, awake, oriented x 3 HEENT: incision over right occipital skull Respiratory system: Clear to auscultation. Respiratory effort normal. Cardiovascular system:RRR. No murmurs, rubs, gallops. Gastrointestinal system: Abdomen is nondistended, soft and nontender. No organomegaly or masses felt. Normal bowel sounds heard. Central nervous system: Alert and oriented. No focal neurological deficits. Extremities: left BKA Skin: No rashes, lesions or ulcers Psychiatry: Judgement and insight appear normal. Mood & affect appropriate.        CBC: Recent Labs  Lab 07/06/20 0811 07/08/20 0124 07/08/20 0940 07/08/20 1042 07/09/20 0545  WBC 20.2* 24.0*  --   --  23.9*  HGB 9.6* 8.8* 8.2* 10.9* 10.3*  HCT 32.3* 29.4* 24.0* 32.0* 33.2*  MCV 76.0* 76.2*  --   --  77.4*  PLT 453* 333  --   --  619   Basic Metabolic Panel: Recent Labs  Lab 07/07/20 1153 07/08/20 0124 07/08/20 0940 07/08/20 1042 07/09/20 0545  NA 137 136 136 137 136  K 4.5 4.6 4.4 4.8 4.2  CL 105 106  --   --  107  CO2 27 26  --   --  25  GLUCOSE 302* 249*  --   --  116*  BUN 29* 30*  --   --  20  CREATININE 0.86 1.10  --   --  0.83  CALCIUM 8.5* 8.4*  --   --  7.7*   GFR: Estimated Creatinine Clearance: 74.5 mL/min (by  C-G formula based on SCr of 0.83 mg/dL).  Liver Function Tests: No results for input(s): AST, ALT, ALKPHOS, BILITOT, PROT, ALBUMIN in the last 168 hours.  Coagulation Profile: Recent Labs  Lab 07/07/20 1807  INR 1.0    HbA1C: Hgb A1c MFr Bld  Date/Time Value Ref Range Status  06/27/2020 05:29 AM 6.4 (H) 4.8 - 5.6 % Final    Comment:    (NOTE) Pre diabetes:          5.7%-6.4%  Diabetes:              >6.4%  Glycemic control for   <7.0% adults with diabetes   04/14/2020 12:00 AM 5.6 <5.7 % of total Hgb Final    Comment:    For the purpose of screening for the presence of diabetes: . <5.7%       Consistent with the absence of diabetes 5.7-6.4%    Consistent with increased risk for diabetes             (  prediabetes) > or =6.5%  Consistent with diabetes . This assay result is consistent with a decreased risk of diabetes. . Currently, no consensus exists regarding use of hemoglobin A1c for diagnosis of diabetes in children. . According to American Diabetes Association (ADA) guidelines, hemoglobin A1c <7.0% represents optimal control in non-pregnant diabetic patients. Different metrics may apply to specific patient populations.  Standards of Medical Care in Diabetes(ADA). .     CBG: Recent Labs  Lab 07/11/20 1553 07/11/20 1625 07/11/20 2123 07/12/20 0828 07/12/20 0913  GLUCAP 203* 192* 124* 62* 92    No results found for this or any previous visit (from the past 240 hour(s)).   Scheduled Meds: . atorvastatin  10 mg Oral q1800  . Chlorhexidine Gluconate Cloth  6 each Topical Daily  . dexamethasone  4 mg Oral Q12H  . heparin injection (subcutaneous)  5,000 Units Subcutaneous Q12H  . insulin aspart  0-20 Units Subcutaneous TID WC  . insulin aspart  0-5 Units Subcutaneous QHS  . insulin aspart  7 Units Subcutaneous TID WC  . insulin glargine  15 Units Subcutaneous BID  . levETIRAcetam  500 mg Oral BID  . lisinopril  20 mg Oral Daily  . pantoprazole  40 mg  Oral Q0600  . traZODone  50 mg Oral QHS      LOS: 16 days   Kathie Dike, MD [facility] Office  [phone] Pager - Text Page per Shea Evans  If 7PM-7AM, please contact night-coverage per Amion 07/12/2020, 10:55 AM

## 2020-07-13 ENCOUNTER — Encounter: Payer: Self-pay | Admitting: *Deleted

## 2020-07-13 ENCOUNTER — Ambulatory Visit (INDEPENDENT_AMBULATORY_CARE_PROVIDER_SITE_OTHER): Payer: Medicare HMO | Admitting: Internal Medicine

## 2020-07-13 LAB — BASIC METABOLIC PANEL
Anion gap: 5 (ref 5–15)
BUN: 18 mg/dL (ref 8–23)
CO2: 27 mmol/L (ref 22–32)
Calcium: 8 mg/dL — ABNORMAL LOW (ref 8.9–10.3)
Chloride: 104 mmol/L (ref 98–111)
Creatinine, Ser: 0.5 mg/dL — ABNORMAL LOW (ref 0.61–1.24)
GFR, Estimated: >60 mL/min (ref >60)
Glucose, Bld: 123 mg/dL — ABNORMAL HIGH (ref 70–99)
Potassium: 4.2 mmol/L (ref 3.5–5.1)
Sodium: 136 mmol/L (ref 135–145)

## 2020-07-13 LAB — GLUCOSE, CAPILLARY
Glucose-Capillary: 181 mg/dL — ABNORMAL HIGH (ref 70–99)
Glucose-Capillary: 161 mg/dL — ABNORMAL HIGH (ref 70–99)
Glucose-Capillary: 143 mg/dL — ABNORMAL HIGH (ref 70–99)
Glucose-Capillary: 217 mg/dL — ABNORMAL HIGH (ref 70–99)

## 2020-07-13 LAB — CBC
HCT: 34 % — ABNORMAL LOW (ref 39.0–52.0)
Hemoglobin: 10.6 g/dL — ABNORMAL LOW (ref 13.0–17.0)
MCH: 24 pg — ABNORMAL LOW (ref 26.0–34.0)
MCHC: 31.2 g/dL (ref 30.0–36.0)
MCV: 77.1 fL — ABNORMAL LOW (ref 80.0–100.0)
Platelets: 173 10*3/uL (ref 150–400)
RBC: 4.41 MIL/uL (ref 4.22–5.81)
RDW: 22.6 % — ABNORMAL HIGH (ref 11.5–15.5)
WBC: 14.1 10*3/uL — ABNORMAL HIGH (ref 4.0–10.5)
nRBC: 0 % (ref 0.0–0.2)

## 2020-07-13 LAB — SURGICAL PATHOLOGY

## 2020-07-13 MED ORDER — SACCHAROMYCES BOULARDII 250 MG PO CAPS: 250.0000 mg | ORAL_CAPSULE | Freq: Two times a day (BID) | ORAL | 0 refills | Status: DC

## 2020-07-13 MED ORDER — CLOPIDOGREL BISULFATE 75 MG PO TABS: 75.0000 mg | ORAL_TABLET | Freq: Every day | ORAL | 1 refills | Status: DC

## 2020-07-13 MED ORDER — SACCHAROMYCES BOULARDII 250 MG PO CAPS
250.0000 mg | ORAL_CAPSULE | Freq: Two times a day (BID) | ORAL | Status: DC
  Administered 2020-07-13 – 2020-07-14 (×3): 250 mg via ORAL
  Filled 2020-07-13 (×3): qty 1

## 2020-07-13 MED ORDER — PANTOPRAZOLE SODIUM 40 MG PO TBEC: 40.0000 mg | DELAYED_RELEASE_TABLET | Freq: Every day | ORAL | 0 refills | Status: DC

## 2020-07-13 NOTE — Progress Notes (Signed)
   Providing Compassionate, Quality Care - Together  NEUROSURGERY PROGRESS NOTE   S: No issues overnight. Doing well  O: EXAM:  BP 129/75 (BP Location: Right Arm)   Pulse (!) 51   Temp 97.7 F (36.5 C) (Oral)   Resp 15   Ht 5\' 9"  (1.753 m)   Wt 81.6 kg   SpO2 99%   BMI 26.58 kg/m    Awake, alert,oriented x3 Speech appropriate FCx4 Face symmetric  PERRL EOMI,L hemianopsia 5/5 BUE/BLE Incision c/d/i  ASSESSMENT: 80 y.o.malewith  1. Right occipital tumor, prelim metastatic  -s/p R crani for resection 07/08/2020  PLAN: -rehab pending -dex taper over 1 week -pain control -Subcu heparin okay -if the patient needs to resume Plavix or needs full anticoagulation for small for PE he may undergo full anticoagulation at 2 weeks postoperative.    Thank you for allowing me to participate in this patient's care.  Please do not hesitate to call with questions or concerns.   Elwin Sleight, [facility] Cell: [phone]

## 2020-07-13 NOTE — Progress Notes (Addendum)
[facility] Progress Note  Patient: Thomas Garcia    OAC:[id number]  DOA: 06/26/2020     Date of Service: the patient was seen and examined on 07/13/2020  Brief hospital course: Past medical history of HTN, type II DM, PAD, smoker, HLD. 3/20 presented with fatigue, found to have 3.5 x 4.3 x 4.2 cm right occipital cortex mass hemorrhage and vasogenic edema as well as 2 mm midline shift.  Also hemoptysis and right upper lobe lung mass.  Bilateral PE. 3/21 transferred to [facility], consultation with neurosurgery, PCCM.  Surgery planned, delayed due to Plavix 3/29 IVC filter placement for PE by IR 4/1 right occipital craniotomy with resection of the mass 4/4 medical oncology consultation.  Currently plan is arrange for safe discharge.  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 If he has recurrence of gross hemoptysis, he would need IR directed embolization  Right occipital mass -suspected metastatic lung cancer Complicated by mild hemorrhage and vasogenic edema resulting in a 2 mm right-to-left shift - Neurosurgery following - continue decadron and empiric keppra - s/p stereotactic right occipital craniotomy for resection of mass - post op care per neurosurgery - follow up pathology- Seen by oncology who will arrange appropriate follow up and further testing. Would likely be a candidate for palliative radiation to brain surgical bed as well as RUL of lung where mass is causing some compression. Neurosurgery has prescribed dexamethasone taper and course of keppra.  Pulmonary embolism noted on CT chest Full anticoagulation on hold in setting of hemoptysis and bleeding from brain mass - no evidence of DVT on bilateral lower extremity venous duplex - began SQ heparin at prophy dose 3/25 - monitor closely for  bleeding - IR placed a retrievable IVC filter 3/29 - Currently breathing comfortably on room air.  Discussed with pulmonology regarding anticoagulation, now that hemoptysis has resolved and brain mass has been resected. Per Dr. Tacy Learn, since patient is currently on room air, breathing comfortably and has IVC filter, decision on anticoagulation can be deferred until he follows up with pulmonology post discharge.  Chronic iron deficiency anemia Likely a consequence of his previously undiagnosed cancer - He did have some bleeding during surgery and was transfused 1 unit prbc - follow up hemglobin 10.3  PAD Status post left BKA - Plavix necessarily on hold due to above - continue statin - followed by Dr. Carlis Abbott -Per Dr. Reatha Armour, will wait at least 2 weeks before resuming antiplatelet agents. Could potentially resume plavix on 07/22/20  HTN Blood pressure stable-adjust medical therapy and follow  Uncontrolled DM 2 w/ hyperglycemia A1c 6.4. Patient was not on insulin PTA. Due to high dose steroids and associated hyperglycemia, he was started on basal/bolus insulin. Now that steroids are being tapered, he is having episodes of hypoglycemia and insulin is being titrated down. Since he is being discharged home on dexamethasone, I suspect he may need to discharge on insulin. His son will be coming in tomorrow for insulin teaching. Continue to follow blood sugars.  GERD Well compensated  Hyponatremia Corrected  Generalized weakness -PT/OT, needs HH PT/OT  Goals of care -discussed code status with patient and his son -both agreed that he would want to be DNR -Gold DNR form filled out and placed in chart for patient to take home on discharge  Diarrhea. Add probiotics.  Diet: Regular diet DVT Prophylaxis:  heparin injection 5,000 Units Start: 07/12/20 1400 SCDs Start: 07/08/20 1503 Place and maintain sequential compression device Start: 06/28/20 1455    Advance goals of care discussion:  DNR  Family Communication: no family was present at bedside, at the time of interview.   Disposition:  Status is: Inpatient  Remains inpatient appropriate because:Inpatient level of care appropriate due to severity of illness Ongoing report of diarrhea.  Dispo: The patient is from: Home              Anticipated d/c is to: Home              Patient currently is not medically stable to d/c.   Difficult to place patient         Subjective: No nausea, vomiting but no fever no chills.  No chest pain.  No abdominal pain.  Reports 7-8 bowel movements today.  No blood in the stool.  Physical Exam:  General: Appear in mild distress, no Rash; Oral Mucosa Clear, moist. no Abnormal Neck Mass Or lumps, Conjunctiva normal  Cardiovascular: S1 and S2 Present, no Murmur, Respiratory: good respiratory effort, Bilateral Air entry present and CTA, no Crackles, no wheezes Abdomen: Bowel Sound present, Soft and no tenderness Extremities: no Pedal edema Neurology: alert and oriented to time, place, and person affect appropriate. no new focal deficit Gait not checked due to patient safety concerns  Vitals:   07/12/20 1641 07/12/20 1948 07/13/20 0001 07/13/20 0003  BP: (!) 125/53 (!) 148/59  (!) 122/49  Pulse:  70  68  Resp:  18  18  Temp: (!) 97.4 F (36.3 C) 97.6 F (36.4 C) 98.7 F (37.1 C) 98.7 F (37.1 C)  TempSrc:  Oral Axillary Axillary  SpO2:  98%  98%  Weight:      Height:        Intake/Output Summary (Last 24 hours) at 07/13/2020 0716 Last data filed at 07/13/2020 0400 Gross per 24 hour  Intake 240 ml  Output --  Net 240 ml   Filed Weights   06/26/20 1856  Weight: 81.6 kg    Data Reviewed: I have personally reviewed and interpreted daily labs, tele strips, imaging. I reviewed all nursing notes, pharmacy notes, vitals, pertinent old records I have discussed plan of care as described above with RN and patient/family.  CBC: Recent Labs  Lab 07/06/20 0811 07/08/20 0124  07/08/20 0940 07/08/20 1042 07/09/20 0545 07/13/20 0328  WBC 20.2* 24.0*  --   --  23.9* 14.1*  HGB 9.6* 8.8* 8.2* 10.9* 10.3* 10.6*  HCT 32.3* 29.4* 24.0* 32.0* 33.2* 34.0*  MCV 76.0* 76.2*  --   --  77.4* 77.1*  PLT 453* 333  --   --  248 967   Basic Metabolic Panel: Recent Labs  Lab 07/07/20 1153 07/08/20 0124 07/08/20 0940 07/08/20 1042 07/09/20 0545 07/13/20 0328  NA 137 136 136 137 136 136  K 4.5 4.6 4.4 4.8 4.2 4.2  CL 105 106  --   --  107 104  CO2 27 26  --   --  25 27  GLUCOSE 302* 249*  --   --  116* 123*  BUN 29* 30*  --   --  20 18  CREATININE 0.86 1.10  --   --  0.83 0.50*  CALCIUM 8.5* 8.4*  --   --  7.7* 8.0*    Studies: No results found.  Scheduled Meds: . atorvastatin  10 mg Oral q1800  . Chlorhexidine Gluconate Cloth  6 each Topical Daily  . dexamethasone  2 mg Oral Q12H  . heparin injection (subcutaneous)  5,000 Units Subcutaneous Q8H  . insulin aspart  0-20 Units Subcutaneous TID WC  . insulin aspart  0-5 Units Subcutaneous QHS  . insulin aspart  7 Units Subcutaneous TID WC  . insulin glargine  15 Units Subcutaneous BID  . levETIRAcetam  500 mg Oral BID  . lisinopril  20 mg Oral Daily  . pantoprazole  40 mg Oral Q0600  . traZODone  50 mg Oral QHS   Continuous Infusions: PRN Meds: acetaminophen, calcium carbonate, HYDROcodone-acetaminophen, labetalol, LORazepam, morphine injection, ondansetron (ZOFRAN) IV, promethazine, senna-docusate  Time spent: 35 minutes  Author: Berle Mull, MD [facility] 07/13/2020 7:16 AM  To reach On-call, see care teams to locate the attending and reach out via [email or web address]. Between 7PM-7AM, please contact night-coverage If you still have difficulty reaching the attending provider, please page the [facility] (Director on Call) for [facility] on amion for assistance.

## 2020-07-13 NOTE — Progress Notes (Signed)
I received referral on Thomas Garcia.  I was then updated that patient will be seen in [place] with medical oncology.  I will update new patient coordinator.

## 2020-07-14 ENCOUNTER — Encounter (HOSPITAL_COMMUNITY): Payer: Self-pay

## 2020-07-14 LAB — GLUCOSE, CAPILLARY
Glucose-Capillary: 217 mg/dL — ABNORMAL HIGH (ref 70–99)
Glucose-Capillary: 191 mg/dL — ABNORMAL HIGH (ref 70–99)
Glucose-Capillary: 234 mg/dL — ABNORMAL HIGH (ref 70–99)

## 2020-07-14 MED ORDER — LOPERAMIDE HCL 2 MG PO CAPS: 2.0000 mg | ORAL_CAPSULE | ORAL | 0 refills | Status: DC | PRN

## 2020-07-14 MED ORDER — LOPERAMIDE HCL 2 MG PO CAPS
2.0000 mg | ORAL_CAPSULE | ORAL | Status: DC | PRN
  Administered 2020-07-14 (×2): 2 mg via ORAL
  Filled 2020-07-14 (×2): qty 1

## 2020-07-14 NOTE — Discharge Summary (Signed)
[facility] Discharge Summary   Patient: Thomas Garcia HDQ:[id number]  PCP: Thomas Albee, MD  Date of admission: 06/26/2020   Date of discharge: 07/14/2020     Discharge Diagnoses:   Principal Problem:   Brain mass Active Problems:   PVD (peripheral vascular disease) ([place])   Essential hypertension   Diabetes mellitus type 2 in nonobese ([place])   Cavitating mass in right upper lung lobe   Hyponatremia   Microcytic anemia   Pulmonary embolism ([place])   Malignant neoplasm of brain ([place])   Right-sided nontraumatic intracerebral hemorrhage ([place])   Admitted From: home Disposition:  Home with home health  Recommendations for Outpatient Follow-up:  1. PCP: Follow-up with PCP, pulmonary, neurosurgery, neuro oncology as recommended 2. Follow up LABS/TEST: Follow-up on the results of the pathology. 3. Will require radiation oncology referral 4. New Meds: Decadron taper   Follow-up Information    Garcia, Thomas C, DO Follow up in 2 week(s).   Why: will need staples removed at 2 weeks from surgery Contact information: [address] [place] [address] [phone]        Thomas Albee, MD. Schedule an appointment as soon as possible for a visit in 1 week(s).   Specialty: Internal Medicine Contact information: [place] [address] [phone]        Care, [facility] Follow up.   Specialty: Home Health Services Why: Home health physical and occupational therapy; Will agency will call to schedule an appointment.  Contact information: [address] [place] [address] [phone]              Discharge Instructions    Ambulatory referral to Pulmonology   Complete by: As directed    Reason for referral: Lung Mass/Lung Nodule   Diet - low sodium heart healthy   Complete by: As directed    Discharge instructions   Complete by: As directed    It is important that you read the instructions as well as go  over your medication list with RN to help you understand your care after this hospitalization.  Please follow-up with PCP in 1-2 weeks.  Please note that we are unable to authorize any refills for discharge medications, once you are discharged. Thus, it is imperative that you return to your primary care physician (or establish a relationship with a primary care physician if you do not have one) for your care needs. So that they can reassess your need for medications and monitor your lab values.  Please request your primary care physician to go over all Hospital Tests and Procedure/Radiological results at the follow up. Please get all Hospital records sent to your PCP by signing hospital release before you go home.   Do not drive, operating heavy machinery, perform activities at heights, swimming or participation in water activities or provide baby sitting services; until you have been seen by Primary Care Physician and are cleared to do such activities.  Do not take more than prescribed Pain, Sleep and Anxiety Medications.  You were cared for by a hospitalist during your hospital stay. If you have any questions about your discharge medications or the care you received while you were in the hospital after you are discharged, you can call the hospital unit/floor you were admitted to and ask to speak with the hospitalist who took care of you. Ask for Hospitalist on call, if the hospitalist that took care of you is not available.  Once you are discharged, your primary care physician will help you with any further medical issues. You Must read complete instructions/literature along with all the possible adverse reactions/side effects for all the Medicines you take and that have been prescribed to you. Take any new Medicines after you have completely understood and accept all the possible adverse reactions/side effects. If you have smoked or chewed Tobacco, please STOP smoking. If you drink alcohol, please  safely STOP the use. Do not drive, operating heavy machinery, perform activities at heights, swimming or participation in water activities or provide baby sitting services under influence.   Increase activity slowly   Complete by: As directed    No wound care   Complete by: As directed       Diet recommendation: Cardiac diet  Activity: The patient is advised to gradually reintroduce usual activities, as tolerated  Discharge Condition: stable  Code Status: DNR   History of present illness: As per the H and P dictated on admission, "Thomas Garcia is a 80 y.o. male with medical history significant for hypertension, type 2 diabetes mellitus, and peripheral arterial disease, now presenting to the emergency department for evaluation of general weakness, fatigue, nausea, loss of appetite, and cough with scant hemoptysis.  Patient was seen in the emergency department 1 month ago complaining of shortness of breath and hemoptysis, was found to have small bilateral PE that were age-indeterminate as well as a masslike consolidation involving the right upper lobe with some cavitation, and was recommended for admission to the hospital but refused at that time and went home.  Since then, he has had progressive general weakness, fatigue, nausea, progressive loss of appetite, and seems to be confused at times per report of his son at the bedside.  He continues to have cough and shortness of breath and continues to have some blood-streaked sputum.  He has chills intermittently but has not taken his temperature.  Resting tremor seems to be worsening per report of his son.  Patient reports some headaches but denies focal numbness or weakness.  Reports that he recently quit smoking. He uses a wheelchair at baseline.   ED Course: Upon arrival to the ED, patient is found to be afebrile, saturating mid 90s on room air, and with stable blood pressure in the 90s to low 096E systolic.  EKG with sinus rhythm, RBBB, and  LAFB.  Chest x-ray with improving right upper lobe consolidation.  Head CT concerning for a mass at the right occipital cortex with associated hemorrhage and vasogenic edema with 2 mm right to left shift.  Chemistry panel features a glucose 157 and sodium 132.  CBC with increased leukocytosis, stable microcytic anemia, and increasing thrombocytosis.  Neurosurgery was consulted by the ED physician and recommends medical admission to [facility] Course:  Summary of his active problems in the hospital is as following. 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 If he has recurrence ofgross hemoptysis, he would needIR directed embolization  Right occipital mass -suspected metastatic lung cancer Complicated by mild hemorrhage and vasogenic edema resulting in a 2 mm right-to-left shift - Neurosurgery following - continue decadron and empiric keppra - s/p stereotactic right occipital craniotomy for resection of mass - post op care per neurosurgery - follow up pathology-Seen by oncology who will arrange appropriate follow up and further testing. Would likely be a candidate for palliative radiation to  brain surgical bed as well as RUL of lung where mass is causing some compression. Neurosurgery has prescribed dexamethasone taper and course of keppra.  Pulmonary embolismnoted on CT chest Full anticoagulation on hold in setting of hemoptysis and bleeding from brain mass - no evidence of DVT on bilateral lower extremity venous duplex - began SQ heparin at prophy dose 3/25 - monitor closely for bleeding - IR placed a retrievable IVC filter 3/29 - Currently breathing comfortably on room air.Discussed with pulmonology regarding anticoagulation, now that hemoptysis has resolved and brain mass has been resected. Per Dr. Tacy Learn, since patient is  currently on room air, breathing comfortably and has IVC filter, decision on anticoagulation can be deferred until he follows up with pulmonology post discharge. Referral placed.  Chronic iron deficiency anemia Likely a consequence of his previously undiagnosed cancer - He did have some bleeding during surgery and was transfused 1 unit prbc - follow up hemglobin remained stable.  PAD Status post left BKA - Plavix necessarily on hold due to above - continue statin - followed by Dr. Carlis Abbott -Per Dr. Reatha Armour, will wait at least 2 weeks before resuming antiplatelet agents. Could potentially resume plavix on 07/23/20  HTN Blood pressure stable-adjust medical therapy and follow  Uncontrolled DM 2 w/ hyperglycemia A1c 6.4. Patient was not on insulin PTA. Due to high dose steroids and associated hyperglycemia, he was started on basal/bolus insulin. Now that steroids are being tapered, he is having episodes of hypoglycemia.  At present no recommending only Metformin on discharge.  GERD Well compensated  Hyponatremia Corrected  Generalized weakness -PT/OT, needs HH PT/OT  Goals of care -discussed code status with patient and his son -both agreed that he would want to be DNR -Gold DNR form filled out and placed in chart for patient to take home on discharge  Diarrhea. Add probiotics and Imodium. No evidence of acute infection.  No abdominal pain.  Leukocytosis secondary to being on steroids.  Already trending down.  No blood in the stool.  No further work-up.  Patient was seen by physical therapy, who recommended Home Health. On the day of the discharge the patient's vitals were stable, and no other acute medical condition were reported by patient. The patient was felt safe to be discharge at Home with Home health.  Consultants: Medical oncology, IR, CCM, neurosurgery Procedures: Stereotactic right occipital craniotomy  DISCHARGE MEDICATION: Allergies as of 07/14/2020   No Known  Allergies     Medication List    TAKE these medications   acetaminophen 325 MG tablet Commonly known as: TYLENOL Take 2 tablets (650 mg total) by mouth every 4 (four) hours as needed for headache or mild pain. What changed: when to take this   atorvastatin 10 MG tablet Commonly known as: LIPITOR Take 1 tablet (10 mg total) by mouth daily at 6 PM.   clopidogrel 75 MG tablet Commonly known as: PLAVIX Take 1 tablet (75 mg total) by mouth daily. Start taking on: July 23, 2020 What changed: These instructions start on July 23, 2020. If you are unsure what to do until then, ask your doctor or other care provider.   dexamethasone 2 MG tablet Commonly known as: DECADRON Take 1 tablet (2 mg total) by mouth 2 (two) times daily for 3 days, THEN 1 tablet (2 mg total) daily for 3 days, THEN 1 tablet (2 mg total) every other day for 4 days. Start taking on: July 12, 2020   ferrous sulfate 324 MG Tbec Take 324  mg by mouth daily with breakfast.   levETIRAcetam 500 MG tablet Commonly known as: KEPPRA Take 1 tablet (500 mg total) by mouth 2 (two) times daily for 3 days.   lisinopril 5 MG tablet Commonly known as: ZESTRIL Take 1 tablet (5 mg total) by mouth daily.   loperamide 2 MG capsule Commonly known as: IMODIUM Take 1 capsule (2 mg total) by mouth as needed for diarrhea or loose stools.   metFORMIN 500 MG tablet Commonly known as: GLUCOPHAGE Take 1 tablet (500 mg total) by mouth 2 (two) times daily with a meal.   nicotine 14 mg/24hr patch Commonly known as: Nicoderm CQ Place 1 patch (14 mg total) onto the skin daily.   pantoprazole 40 MG tablet Commonly known as: PROTONIX Take 1 tablet (40 mg total) by mouth daily at 6 (six) AM.   Rolaids Advanced 1000-200-40 MG Chew Generic drug: Cal Carb-Mag Hydrox-Simeth Chew 1 tablet by mouth daily as needed (for indigestion).   saccharomyces boulardii 250 MG capsule Commonly known as: FLORASTOR Take 1 capsule (250 mg total) by  mouth 2 (two) times daily.   traZODone 50 MG tablet Commonly known as: DESYREL Take 1 tablet (50 mg total) by mouth at bedtime.       Discharge Exam: Filed Weights   06/26/20 1856  Weight: 81.6 kg   Vitals:   07/14/20 1131 07/14/20 1549  BP: (!) 130/58 (!) 153/57  Pulse: 67 70  Resp: 18 16  Temp: 98.2 F (36.8 C) 98.2 F (36.8 C)  SpO2: 95% 95%   General: Appear in mild distress, no Rash; Oral Mucosa Clear, moist. no Abnormal Neck Mass Or lumps, Conjunctiva normal  Cardiovascular: S1 and S2 Present, no Murmur, Respiratory: good respiratory effort, Bilateral Air entry present and CTA, no Crackles, no wheezes Abdomen: Bowel Sound present, Soft and no tenderness Extremities: no Pedal edema, left BKA Neurology: alert and oriented to time, place, and person affect appropriate. no new focal deficit Gait not checked due to patient safety concerns  The results of significant diagnostics from this hospitalization (including imaging, microbiology, ancillary and laboratory) are listed below for reference.    Significant Diagnostic Studies: CT Head Wo Contrast  Result Date: 06/26/2020 CLINICAL DATA:  Initial evaluation for acute neuro deficit, stroke suspected. EXAM: CT HEAD WITHOUT CONTRAST TECHNIQUE: Contiguous axial images were obtained from the base of the skull through the vertex without intravenous contrast. COMPARISON:  None available. 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 There is an irregular heterogeneous mass positioned at the right occipital cortex measuring 3.5 x 4.3 x 4.2 cm in greatest dimensions, most concerning for a primary CNS neoplasm. Associated hyperdensity suggests a degree of associated hemorrhage. Associated vasogenic edema throughout the posterior right parieto-occipital region with partial effacement of the posterior right lateral ventricle. No more than trace 2 mm right-to-left shift. Basilar cisterns remain  patent. No other acute intracranial hemorrhage or large vessel territory infarct. No extra-axial fluid collection. Vascular: No hyperdense vessel. Scattered vascular calcifications noted within the carotid siphons. Skull: Scalp soft tissues and calvarium within normal limits. Sinuses/Orbits: Globes and orbital soft tissues demonstrate no acute finding. Paranasal sinuses are clear. No mastoid effusion. Other: None. IMPRESSION: 1. 3.5 x 4.3 x 4.2 cm irregular heterogeneous mass positioned at the right occipital cortex. Intrinsic hyperdensity suggests associated hemorrhage. Vasogenic edema throughout the posterior right parieto-occipital region with trace 2 mm right-to-left shift. Differential considerations include a primary CNS neoplasm versus solitary intracranial metastasis. 2. Age-related cerebral atrophy  with chronic microvascular ischemic disease. Critical Value/emergent results were called by telephone at the time of interpretation on 06/26/2020 at 9:10 pm to provider [facility] , who verbally acknowledged these results. Electronically Signed   By: Jeannine Boga M.D.   On: 06/26/2020 21:13   MR BRAIN WO CONTRAST  Result Date: 06/28/2020 CLINICAL DATA:  81 year old male with neurologic deficit, hemorrhage and edema in the right occipital lobe on noncontrast head CT. And enlarging right upper lobe lung masslike opacity on CT Chest, Abdomen, and Pelvis. Possible hemorrhagic metastasis or primary tumor. EXAM: MRI HEAD WITHOUT CONTRAST TECHNIQUE: Multiplanar, multiecho pulse sequences of the brain and surrounding structures were obtained without intravenous contrast. COMPARISON:  Head CT without contrast 06/26/2020. FINDINGS: The examination had to be discontinued prior to completion due to patient inability to continue, despite premedication. No contrast was administered. Axial DWI/DTI, along with T2, SWI and T1 weighted imaging was obtained. Brain: Hemorrhage related susceptibility artifact throughout  the right occipital pole. Areas of T1 and T2 hyperintense blood products demonstrate diffusion restriction, but there is no larger area of restricted diffusion to strongly suggest acute infarction. Heterogeneous T1 and T2 blood products with decreased susceptibility throughout the right occipital pole tracking to the right occipital horn encompass an area of 56 x 37 x 42 mm (AP by transverse by CC), for an estimated volume of 43 mL. Confluent vasogenic edema tracking superiorly and anteriorly from the hemorrhage with facilitated diffusion and appears stable from the recent CT. Stable regional mass effect. No midline shift at this time. Basilar cisterns remain patent. Small volume of right lateral intraventricular hemorrhage. No ventriculomegaly. Chronic lacunar infarct left thalamus. No definite additional intracranial hemosiderin, suspect artifact rather than superficial siderosis on SWI in the left perirolandic region on series 13, image 86. Cervicomedullary junction and pituitary are within normal limits. No other vasogenic edema is evident. No other suspicious brain lesion is evident on the provided sequences. Vascular: Major intracranial vascular flow voids are preserved. Skull and upper cervical spine: Negative. Visualized bone marrow signal is within normal limits. Sinuses/Orbits: Stable, negative. Other: Mastoids are clear. IMPRESSION: 1. Truncated exam and motion degraded despite premedication and repeated imaging attempts. No contrast was administered. 2. Apparently solitary acute hemorrhage or hemorrhagic lesion in the right occipital pole encompassing 43 mL, indeterminate for tumor. Abundant regional vasogenic edema and mild mass effect stable from the CT. Trace right lateral IVH with no ventriculomegaly. 3. Chronic lacunar infarct left thalamus. Electronically Signed   By: Genevie Ann M.D.   On: 06/28/2020 05:52   MR BRAIN W WO CONTRAST  Result Date: 07/10/2020 CLINICAL DATA:  Follow-up examination  status post right-sided craniotomy for tumor resection. EXAM: MRI HEAD WITHOUT AND WITH CONTRAST TECHNIQUE: Multiplanar, multiecho pulse sequences of the brain and surrounding structures were obtained without and with intravenous contrast. CONTRAST:  7.38mL GADAVIST GADOBUTROL 1 MMOL/ML IV SOLN COMPARISON:  Previous brain MRI from 06/30/2020. FINDINGS: Brain: Postoperative changes from interval right parieto-occipital craniotomy. Small subdural collection subjacent to the craniotomy bone flap measures up to 5 mm in thickness without significant mass effect. Superimposed postoperative pneumocephalus. Additional postoperative pneumocephalus overlies the right frontal convexity anteriorly. 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 Deep margin of the resection cavity extends to occipital horn of the right lateral ventricle, with adjacent trace intraventricular blood products. Smooth postcontrast enhancement seen about the resection cavity following contrast administration favored to in large part  be postoperative. No obvious residual tumor identified. No significant infarct about the surgical site or other complication. Persistent vasogenic edema seen throughout the posterior right cerebral hemisphere, relatively similar as compared to preoperative exam at this time. No significant midline shift. Basilar cisterns remain patent. No hydrocephalus. Remainder the brain is stable in appearance with associated atrophy and chronic microvascular ischemic disease. No other infarct or other acute intracranial abnormality. No other abnormal enhancement. Vascular: Major intracranial vascular flow voids are maintained. Normal flow voids seen within the major dural sinuses. Skull and upper cervical spine: Craniocervical junction within normal limits. Bone marrow signal intensity normal. Post craniotomy changes at the  right posterior scalp without adverse features. Sinuses/Orbits: Globes and orbital soft tissues within normal limits. Paranasal sinuses are largely clear. Trace left mastoid effusion noted, of doubtful significance. Other: None. IMPRESSION: 1. Postoperative changes from interval right parieto-occipital craniotomy for tumor resection. Relatively smooth enhancement about the resection cavity favored to be postoperative in nature. No gross residual tumor evident at this time. 2. Residual vasogenic edema throughout the posterior right cerebral hemisphere, relatively similar as compared to preoperative exam. 3. Otherwise stable appearance of the brain, with no other new acute intracranial abnormality. Electronically Signed   By: Jeannine Boga M.D.   On: 07/10/2020 00:25   MR BRAIN W WO CONTRAST  Result Date: 06/30/2020 CLINICAL DATA:  Brain/CNS neoplasm, staging. EXAM: MRI HEAD WITHOUT AND WITH CONTRAST TECHNIQUE: Multiplanar, multiecho pulse sequences of the brain and surrounding structures were obtained without and with intravenous contrast. CONTRAST:  61mL GADAVIST GADOBUTROL 1 MMOL/ML IV SOLN COMPARISON:  MRI of the brain June 28, 2020. FINDINGS: Brain: Redemonstrated large hemorrhagic lesion in the right occipital lobe measuring approximately 6.0 x 1.5 x 3.7 cm, not significantly changed from prior MRI. The lesion shows thin peripheral multilobular contrast enhancement extending from the cortical surface to the right atrium subependymal region with extension of the hemorrhage into the right lateral ventricle. Prominent vasogenic edema surrounding the lesion and extending into the right parietal and temporal lobes with mass effect on the occipital horn and atrium of the right lateral ventricle. No hydrocephalus or significant midline shift. No other enhancing lesion identified. Scattered foci of T2 hyperintensity are seen within the white matter of the cerebral hemispheres, nonspecific, most likely related  to chronic small vessel ischemia. Vascular: Normal flow voids. Skull and upper cervical spine: Normal marrow signal. Sinuses/Orbits: Negative. 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 Electronically Signed   By: Pedro Earls M.D.   On: 06/30/2020 14:54   IR IVC FILTER PLMT / S&I Burke Keels GUID/MOD SED  Result Date: 07/05/2020 CLINICAL DATA:  80 year old male with history of subclinical pulmonary emboli and brain mass with forthcoming neuro surgical intervention EXAM: 1. ULTRASOUND GUIDANCE FOR VASCULAR ACCESS OF THE RIGHT  VEIN. 2. IVC VENOGRAM. 3. PERCUTANEOUS IVC FILTER PLACEMENT. ANESTHESIA/SEDATION: One mg IV Versed; 25 mcg IV Fentanyl. Total Moderate Sedation Time Tenminutes. CONTRAST:  33mL OMNIPAQUE IOHEXOL 300 MG/ML  SOLN FLUOROSCOPY TIME:  54 seconds, 98 mGy PROCEDURE: The procedure, risks, benefits, and alternatives were explained to the patient. Questions regarding the procedure were encouraged and answered. The patient understands and consents to the procedure. The patient was prepped with Betadine in a sterile fashion, and a sterile drape was applied covering the operative field. A sterile gown and sterile gloves were used for the procedure. Local  anesthesia was provided with 1% Lidocaine. Under direct ultrasound guidance, a 21 gauge needle was advanced into the right internal jugular vein with ultrasound image documentation performed. After securing access with a micropuncture dilator, a guidewire was advanced into the inferior vena cava. A deployment sheath was advanced over the guidewire. This was utilized to perform IVC venography. The deployment sheath was further positioned in an appropriate location for filter deployment. A Denali IVC filter was then advanced in the sheath. This was then fully deployed in the infrarenal  IVC. Final filter position was confirmed with a fluoroscopic spot image. Contrast injection was also performed through the sheath under fluoroscopy to confirm patency of the IVC at the level of the filter. After the procedure the sheath was removed and hemostasis obtained with manual compression. COMPLICATIONS: None. FINDINGS: IVC venography demonstrates a normal caliber IVC with no evidence of thrombus. Renal veins are identified bilaterally. The IVC filter was successfully positioned below the level of the renal veins and is appropriately oriented. This IVC filter has both permanent and retrievable indications. IMPRESSION: Placement of percutaneous IVC filter in infrarenal IVC. IVC venogram shows no evidence of IVC thrombus and normal caliber of the inferior vena cava. This filter does have both permanent and retrievable indications. PLAN: This IVC filter is potentially retrievable. The patient will be assessed for filter retrieval by Interventional Radiology in approximately 8-12 weeks. Further recommendations regarding filter retrieval, continued surveillance or declaration of device permanence, will be made at that time. Ruthann Cancer, MD [facility] Radiology Electronically Signed   By: Ruthann Cancer MD   On: 07/05/2020 15:23   CT CHEST ABDOMEN PELVIS W CONTRAST  Result Date: 06/28/2020 CLINICAL DATA:  Brain lesion, assess for metastatic disease EXAM: CT CHEST, ABDOMEN, AND PELVIS WITH CONTRAST TECHNIQUE: Multidetector CT imaging of the chest, abdomen and pelvis was performed following the standard protocol during bolus administration of intravenous contrast. CONTRAST:  173mL OMNIPAQUE IOHEXOL 300 MG/ML  SOLN COMPARISON:  CT angiography 05/27/2020, CT abdomen and pelvis 04/27/2012 FINDINGS: CT CHEST FINDINGS Cardiovascular: Normal heart size. No pericardial effusion. Coronary artery calcifications are present. The aortic root is suboptimally assessed given  cardiac pulsation artifact. Atherosclerotic plaque within the normal caliber aorta and proximal great vessels. No acute luminal abnormality of the imaged aorta. No periaortic stranding or hemorrhage. Normal 3 vessel branching of the aortic arch. Proximal great vessels are free of acute abnormality. Central pulmonary arteries are normal caliber. Linear hypodense filling defect in the distal left main pulmonary artery at the bifurcation of the lingular and left lower lobar arteries (3/27) several additional smaller possible filling defects are seen pulmonary arteries of the left lower lobe (3/34) and right lower lobe (3/31). Truncation of the right posterior segmental pulmonary artery as it enters the region of the heterogeneous attenuating masslike opacity in the right upper lobe. Additional narrowing of the adjacent pulmonary veins and hilar airways as well. Central pulmonary arteries remain normal caliber. RV/LV ratio is maintained. Mediastinum/Nodes: Mass within the right upper with extension along the right superior mediastinal margin extending into the right hilum and subcarinal space. There is associated narrowing of the superior hilar airways and vessels. A right superior hilar deposit measures up to 12 mm in size, previously 9 mm (3/23) increasing subcarinal extension as well narrowing the right mainstem bronchus with a subcarinal deposit measures approximately 2.3 cm on today's exam, previously 2.1 cm. No contralateral hilar adenopathy. The right lateral margin of the midthoracic esophagus is difficult  to separate from this soft tissue mass. Lungs/Pleura: Large area of masslike opacity in the right upper lobe with extension along the right superior mediastinal margin and into the hilum and subcarinal space. This measures approximately 6.4 x 8 cm, previously 5.2 x 6.3 cm at similar levels. There is decreasing central cavitation now with a slightly more hypoattenuating necrotic appearance on today's exam.  Associated narrowing of the hilar airways and vessels including some occlusion of the upper lobe airways centrally. Surrounding ground-glass and consolidative opacity may reflect some postobstructive pneumonia change. Some additional more patchy ground-glass opacities in the right upper lobe periphery are similar to prior. Stable 4 mm subpleural nodules seen in the lateral and posterior right lower lobe. Remaining portions of the lungs are clear. No pneumothorax or pleural effusion. Minimal atelectasis. Musculoskeletal: No worrisome chest wall mass or lesion. No direct extension into the chest wall or adjacent osseous structures by the upper lobe mass. Multilevel degenerative changes are present in the imaged portions of the spine. Exaggerated thoracic kyphosis with some degenerative vertebral body height loss and multilevel Schmorl's node formations. No acute osseous abnormality or suspicious osseous lesion. CT ABDOMEN PELVIS FINDINGS Hepatobiliary: No worrisome focal liver lesions. Smooth liver surface contour. Normal hepatic attenuation. Normal gallbladder and biliary tree. Pancreas: No pancreatic ductal dilatation or surrounding inflammatory changes. 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 Adrenals/Urinary Tract: Hypoattenuating nodule in the medial limb of the right adrenal gland again measures up to 1 cm in size, unchanged since 2014 and strongly favoring a benign process such as adenoma rather than metastatic lesion. No concerning right adrenal lesion. Stable appearance of the multiple fluid attenuation cyst in a slightly more intermediate attenuation exophytic cyst arising from the lower pole left kidney. Mild symmetric bilateral perinephric stranding, a nonspecific finding which may correlate with advanced age or decreased renal function. No concerning  renal mass, obstructive urolith or hydronephrosis. Multiple coarse calcifications layering dependently within the bladder may reflect bladder calculi though these were not present on comparison imaging. There is however some chronic indentation of the urinary bladder by an enlarged, heterogeneous prostate gland. Stomach/Bowel: Distal esophagus, stomach and duodenal sweep are unremarkable. No small bowel wall thickening or dilatation. No evidence of obstruction. A normal appendix is visualized. No colonic dilatation or wall thickening. Vascular/Lymphatic: Atherosclerotic calcifications within the abdominal aorta and branch vessels. No aneurysm or ectasia. No enlarged abdominopelvic lymph nodes. Reproductive: Heterogeneous, enlarged prostate with few typically benign prostate calcifications. Indentation of the bladder base. Other: No abdominopelvic free fluid or free gas. No bowel containing hernias. Musculoskeletal: Multilevel degenerative changes are present in the imaged portions of the spine. Additional degenerative changes in the hips and pelvis. The osseous structures appear diffusely demineralized which may limit detection of small or nondisplaced fractures. No acute osseous abnormality or suspicious osseous lesion. IMPRESSION: 1. Large area of masslike opacity in the right upper lobe with extension along the right superior mediastinal margin and into the right hilum and subcarinal space. This measures approximately 6.4 x 8 cm, previously 5.2 x 6.3 cm at similar levels. There is decreasing central cavitation now with a slightly more hypoattenuating necrotic appearance on today's exam. Findings are concerning for primary lung malignancy, particularly given the findings in the brain. Necrotic/atypical infection is less favored. Consider further evaluation with tissue sampling potentially via bronchoscopy. 2. Increasing subcarinal and right hilar extension with narrowing of  the superior hilar airways and vessels  including some occlusion of the right upper lobe airways. The right lateral margin of the midthoracic esophagus is difficult to separate from this soft tissue mass. 3. Some more peripheral areas of patchy consolidation interstitial opacity throughout the right upper lobe likely reflects a postobstructive process 4. Linear hypodense filling defect in the distal left main pulmonary artery at the bifurcation of the lingular and left lower lobar arteries, several additional smaller possible filling defects are seen pulmonary arteries of the left lower lobe and right lower lobe (3/34) and right lower lobe (3/31). Findings are concerning for pulmonary emboli. No CT evidence of right heart strain within limitations of this exam. 5. Multiple coarse calcifications layering dependently within the bladder may reflect bladder calculi though these were not present on comparison imaging. No obstructive urolithiasis or hydronephrosis. 6. Heterogeneous, enlarged prostate gland with some chronic indentation of the urinary bladder base. Could correlate for features of outlet obstruction with urinalysis were clinically appropriate. Furthermore recommend assessment with prostate exam and PSA as warranted. 7. No other convincing evidence of primary malignancy or metastatic disease within the abdomen or pelvis. Electronically Signed   By: Lovena Le M.D.   On: 06/28/2020 01:40   DG Chest Portable 1 View  Result Date: 06/26/2020 CLINICAL DATA:  Fever, chills, weakness, cough EXAM: PORTABLE CHEST 1 VIEW COMPARISON:  05/27/2020 FINDINGS: Consolidation again seen in the right upper lobe compatible with pneumonia. This has improved since prior study. No confluent opacity on the left. Heart is normal size. Aortic atherosclerosis. No effusions. No acute bony abnormality. IMPRESSION: Continued but improving right upper lobe consolidation/pneumonia. Electronically Signed   By: Rolm Baptise M.D.   On: 06/26/2020 20:27   VAS [name] LOWER  EXTREMITY VENOUS (DVT)  Result Date: 06/28/2020  Lower Venous DVT Study Indications: Pulmonary embolism.  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 Comparison Study: No prior venous studies available. Performing Technologist: Darlin Coco RDMS,RVT  Examination Guidelines: A complete evaluation includes B-mode imaging, spectral Doppler, color Doppler, and power Doppler as needed of all accessible portions of each vessel. Bilateral testing is considered an integral part of a complete examination. Limited examinations for reoccurring indications may be performed as noted. The reflux portion of the exam is performed with the patient in reverse Trendelenburg.  +---------+---------------+---------+-----------+----------+--------------+ RIGHT    CompressibilityPhasicitySpontaneityPropertiesThrombus Aging +---------+---------------+---------+-----------+----------+--------------+ CFV      Full           Yes      Yes                                 +---------+---------------+---------+-----------+----------+--------------+ SFJ      Full                                                        +---------+---------------+---------+-----------+----------+--------------+ FV Prox  Full                                                        +---------+---------------+---------+-----------+----------+--------------+ FV Mid   Full                                                        +---------+---------------+---------+-----------+----------+--------------+  FV DistalFull                                                        +---------+---------------+---------+-----------+----------+--------------+ PFV      Full                                                        +---------+---------------+---------+-----------+----------+--------------+ POP      Full           Yes      Yes                                  +---------+---------------+---------+-----------+----------+--------------+ PTV      Full                                                        +---------+---------------+---------+-----------+----------+--------------+ PERO     Full                                                        +---------+---------------+---------+-----------+----------+--------------+   +---------+---------------+---------+-----------+----------+--------------+ LEFT     CompressibilityPhasicitySpontaneityPropertiesThrombus Aging +---------+---------------+---------+-----------+----------+--------------+ CFV      Full           Yes      Yes                                 +---------+---------------+---------+-----------+----------+--------------+ SFJ      Full                                                        +---------+---------------+---------+-----------+----------+--------------+ FV Prox  Full                                                        +---------+---------------+---------+-----------+----------+--------------+ FV Mid   Full                                                        +---------+---------------+---------+-----------+----------+--------------+ FV DistalFull                                                        +---------+---------------+---------+-----------+----------+--------------+  PFV      Full                                                        +---------+---------------+---------+-----------+----------+--------------+ POP      Full           Yes      Yes                                 +---------+---------------+---------+-----------+----------+--------------+ PTV                                                   BKA            +---------+---------------+---------+-----------+----------+--------------+ PERO                                                  BKA             +---------+---------------+---------+-----------+----------+--------------+     Summary: RIGHT: - There is no evidence of deep vein thrombosis in the lower extremity.  - No cystic structure found in the popliteal fossa.  LEFT: - There is no evidence of deep vein thrombosis in the lower extremity.  - No cystic structure found in the popliteal fossa.  *See table(s) above for measurements and observations. Electronically signed by Deitra Mayo MD on 06/28/2020 at 3:46:57 PM.    Final     Microbiology: No results found for this or any previous visit (from the past 240 hour(s)).   Labs: CBC: Recent Labs  Lab 07/08/20 0124 07/08/20 0940 07/08/20 1042 07/09/20 0545 07/13/20 0328  WBC 24.0*  --   --  23.9* 14.1*  HGB 8.8* 8.2* 10.9* 10.3* 10.6*  HCT 29.4* 24.0* 32.0* 33.2* 34.0*  MCV 76.2*  --   --  77.4* 77.1*  PLT 333  --   --  248 314   Basic Metabolic Panel: Recent Labs  Lab 07/08/20 0124 07/08/20 0940 07/08/20 1042 07/09/20 0545 07/13/20 0328  NA 136 136 137 136 136  K 4.6 4.4 4.8 4.2 4.2  CL 106  --   --  107 104  CO2 26  --   --  25 27  GLUCOSE 249*  --   --  116* 123*  BUN 30*  --   --  20 18  CREATININE 1.10  --   --  0.83 0.50*  CALCIUM 8.4*  --   --  7.7* 8.0*   Liver Function Tests: No results for input(s): AST, ALT, ALKPHOS, BILITOT, PROT, ALBUMIN in the last 168 hours. CBG: Recent Labs  Lab 07/13/20 1739 07/13/20 2123 07/14/20 0747 07/14/20 1128 07/14/20 1711  GLUCAP 143* 217* 217* 191* 234*    Time spent: 35 minutes  Signed:  Berle Mull  [facility] 07/14/2020 8:40 PM

## 2020-07-14 NOTE — Progress Notes (Signed)
HEMATOLOGY-ONCOLOGY PROGRESS NOTE  SUBJECTIVE: Mild headache reported.  He offers no other specific complaints.  Anticipates discharge home later today.  REVIEW OF SYSTEMS:   Constitutional: Denies fevers, chills  Eyes: Denies blurriness of vision Ears, nose, mouth, throat, and face: Denies mucositis or sore throat Respiratory: Denies cough, dyspnea or wheezes Cardiovascular: Denies palpitation, chest discomfort Gastrointestinal:  Denies nausea, heartburn or change in bowel habits Skin: Denies abnormal skin rashes Lymphatics: Denies new lymphadenopathy or easy bruising Neurological: Reports mild headache  Behavioral/Psych: Mood is stable, no new changes  Extremities: No lower extremity edema All other systems were reviewed with the patient and are negative.  I have reviewed the past medical history, past surgical history, social history and family history with the patient and they are unchanged from previous note.   PHYSICAL EXAMINATION: ECOG PERFORMANCE STATUS: 2 - Symptomatic, <50% confined to bed  Vitals:   07/14/20 0748 07/14/20 1131  BP: 137/63 (!) 130/58  Pulse: 81 67  Resp: 14 18  Temp: 98.3 F (36.8 C) 98.2 F (36.8 C)  SpO2: 98% 95%   Filed Weights   06/26/20 1856  Weight: 81.6 kg    Intake/Output from previous day: 04/06 0701 - 04/07 0700 In: 720 [P.O.:720] Out: 350 [Urine:350]  GENERAL:alert, no distress and comfortable SKIN: skin color, texture, turgor are normal, no rashes or significant lesions EYES: normal, Conjunctiva are pink and non-injected, sclera clear OROPHARYNX:no exudate, no erythema and lips, buccal mucosa, and tongue normal  LUNGS: clear to auscultation and percussion with normal breathing effort HEART: regular rate & rhythm and no murmurs ABDOMEN:abdomen soft, non-tender and normal bowel sounds  NEURO: alert & oriented x 3 with fluent speech, no focal motor/sensory deficits  LABORATORY DATA:  I have reviewed the data as listed CMP  Latest Ref Rng & Units 07/13/2020 07/09/2020 07/08/2020  Glucose 70 - 99 mg/dL 123(H) 116(H) -  BUN 8 - 23 mg/dL 18 20 -  Creatinine 0.61 - 1.24 mg/dL 0.50(L) 0.83 -  Sodium 135 - 145 mmol/L 136 136 137  Potassium 3.5 - 5.1 mmol/L 4.2 4.2 4.8  Chloride 98 - 111 mmol/L 104 107 -  CO2 22 - 32 mmol/L 27 25 -  Calcium 8.9 - 10.3 mg/dL 8.0(L) 7.7(L) -  Total Protein 6.5 - 8.1 g/dL - - -  Total Bilirubin 0.3 - 1.2 mg/dL - - -  Alkaline Phos 38 - 126 U/L - - -  AST 15 - 41 U/L - - -  ALT 0 - 44 U/L - - -    Lab Results  Component Value Date   WBC 14.1 (H) 07/13/2020   HGB 10.6 (L) 07/13/2020   HCT 34.0 (L) 07/13/2020   MCV 77.1 (L) 07/13/2020   PLT 173 07/13/2020   NEUTROABS 10.3 (H) 06/26/2020    CT Head Wo Contrast  Result Date: 06/26/2020 CLINICAL DATA:  Initial evaluation for acute neuro deficit, stroke suspected. EXAM: CT HEAD WITHOUT CONTRAST TECHNIQUE: Contiguous axial images were obtained from the base of the skull through the vertex without intravenous contrast. COMPARISON:  None available. 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 There is an irregular heterogeneous mass positioned at the right occipital cortex measuring 3.5 x 4.3 x 4.2 cm in greatest dimensions, most concerning for a primary CNS neoplasm. Associated hyperdensity suggests a degree of associated hemorrhage. Associated vasogenic edema throughout the posterior right parieto-occipital region with partial effacement of the posterior right lateral ventricle. No more than trace 2 mm right-to-left shift.  Basilar cisterns remain patent. No other acute intracranial hemorrhage or large vessel territory infarct. No extra-axial fluid collection. Vascular: No hyperdense vessel. Scattered vascular calcifications noted within the carotid siphons. Skull: Scalp soft tissues and calvarium within normal limits. Sinuses/Orbits: Globes and orbital soft tissues demonstrate no acute finding.  Paranasal sinuses are clear. No mastoid effusion. Other: None. IMPRESSION: 1. 3.5 x 4.3 x 4.2 cm irregular heterogeneous mass positioned at the right occipital cortex. Intrinsic hyperdensity suggests associated hemorrhage. Vasogenic edema throughout the posterior right parieto-occipital region with trace 2 mm right-to-left shift. Differential considerations include a primary CNS neoplasm versus solitary intracranial metastasis. 2. 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 Critical Value/emergent results were called by telephone at the time of interpretation on 06/26/2020 at 9:10 pm to provider [facility] , who verbally acknowledged these results. Electronically Signed   By: Jeannine Boga M.D.   On: 06/26/2020 21:13   MR BRAIN WO CONTRAST  Result Date: 06/28/2020 CLINICAL DATA:  80 year old male with neurologic deficit, hemorrhage and edema in the right occipital lobe on noncontrast head CT. And enlarging right upper lobe lung masslike opacity on CT Chest, Abdomen, and Pelvis. Possible hemorrhagic metastasis or primary tumor. EXAM: MRI HEAD WITHOUT CONTRAST TECHNIQUE: Multiplanar, multiecho pulse sequences of the brain and surrounding structures were obtained without intravenous contrast. COMPARISON:  Head CT without contrast 06/26/2020. FINDINGS: The examination had to be discontinued prior to completion due to patient inability to continue, despite premedication. No contrast was administered. Axial DWI/DTI, along with T2, SWI and T1 weighted imaging was obtained. Brain: Hemorrhage related susceptibility artifact throughout the right occipital pole. Areas of T1 and T2 hyperintense blood products demonstrate diffusion restriction, but there is no larger area of restricted diffusion to strongly suggest acute infarction. Heterogeneous T1 and T2 blood products with decreased susceptibility throughout the right occipital pole tracking to the right occipital horn encompass an  area of 56 x 37 x 42 mm (AP by transverse by CC), for an estimated volume of 43 mL. Confluent vasogenic edema tracking superiorly and anteriorly from the hemorrhage with facilitated diffusion and appears stable from the recent CT. Stable regional mass effect. No midline shift at this time. Basilar cisterns remain patent. Small volume of right lateral intraventricular hemorrhage. No ventriculomegaly. Chronic lacunar infarct left thalamus. No definite additional intracranial hemosiderin, suspect artifact rather than superficial siderosis on SWI in the left perirolandic region on series 13, image 86. Cervicomedullary junction and pituitary are within normal limits. No other vasogenic edema is evident. No other suspicious brain lesion is evident on the provided sequences. Vascular: Major intracranial vascular flow voids are preserved. Skull and upper cervical spine: Negative. Visualized bone marrow signal is within normal limits. Sinuses/Orbits: Stable, negative. Other: Mastoids are clear. IMPRESSION: 1. Truncated exam and motion degraded despite premedication and repeated imaging attempts. No contrast was administered. 2. Apparently solitary acute hemorrhage or hemorrhagic lesion in the right occipital pole encompassing 43 mL, indeterminate for tumor. Abundant regional vasogenic edema and mild mass effect stable from the CT. Trace right lateral IVH with no ventriculomegaly. 3. Chronic lacunar infarct left thalamus. Electronically Signed   By: Genevie Ann M.D.   On: 06/28/2020 05:52   MR BRAIN W WO CONTRAST  Result Date: 07/10/2020 CLINICAL DATA:  Follow-up examination status post right-sided craniotomy for tumor resection. EXAM: MRI HEAD WITHOUT AND WITH CONTRAST TECHNIQUE: Multiplanar, multiecho pulse sequences of the brain and surrounding structures were obtained without and with intravenous contrast. CONTRAST:  7.68mL GADAVIST GADOBUTROL 1  MMOL/ML IV SOLN COMPARISON:  Previous brain MRI from 06/30/2020. FINDINGS:  Brain: Postoperative changes from interval right parieto-occipital craniotomy. Small subdural collection subjacent to the craniotomy bone flap measures up to 5 mm in thickness without significant mass effect. Superimposed postoperative pneumocephalus. Additional postoperative pneumocephalus overlies the right frontal convexity anteriorly. 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 Deep margin of the resection cavity extends to occipital horn of the right lateral ventricle, with adjacent trace intraventricular blood products. Smooth postcontrast enhancement seen about the resection cavity following contrast administration favored to in large part be postoperative. No obvious residual tumor identified. No significant infarct about the surgical site or other complication. Persistent vasogenic edema seen throughout the posterior right cerebral hemisphere, relatively similar as compared to preoperative exam at this time. No significant midline shift. Basilar cisterns remain patent. No hydrocephalus. Remainder the brain is stable in appearance with associated atrophy and chronic microvascular ischemic disease. No other infarct or other acute intracranial abnormality. No other abnormal enhancement. Vascular: Major intracranial vascular flow voids are maintained. Normal flow voids seen within the major dural sinuses. Skull and upper cervical spine: Craniocervical junction within normal limits. Bone marrow signal intensity normal. Post craniotomy changes at the right posterior scalp without adverse features. Sinuses/Orbits: Globes and orbital soft tissues within normal limits. Paranasal sinuses are largely clear. Trace left mastoid effusion noted, of doubtful significance. Other: None. IMPRESSION: 1. Postoperative changes from interval right parieto-occipital craniotomy for tumor resection. Relatively smooth  enhancement about the resection cavity favored to be postoperative in nature. No gross residual tumor evident at this time. 2. Residual vasogenic edema throughout the posterior right cerebral hemisphere, relatively similar as compared to preoperative exam. 3. Otherwise stable appearance of the brain, with no other new acute intracranial abnormality. Electronically Signed   By: Jeannine Boga M.D.   On: 07/10/2020 00:25   MR BRAIN W WO CONTRAST  Result Date: 06/30/2020 CLINICAL DATA:  Brain/CNS neoplasm, staging. EXAM: MRI HEAD WITHOUT AND WITH CONTRAST TECHNIQUE: Multiplanar, multiecho pulse sequences of the brain and surrounding structures were obtained without and with intravenous contrast. CONTRAST:  21mL GADAVIST GADOBUTROL 1 MMOL/ML IV SOLN COMPARISON:  MRI of the brain June 28, 2020. FINDINGS: Brain: Redemonstrated large hemorrhagic lesion in the right occipital lobe measuring approximately 6.0 x 1.5 x 3.7 cm, not significantly changed from prior MRI. The lesion shows thin peripheral multilobular contrast enhancement extending from the cortical surface to the right atrium subependymal region with extension of the hemorrhage into the right lateral ventricle. Prominent vasogenic edema surrounding the lesion and extending into the right parietal and temporal lobes with mass effect on the occipital horn and atrium of the right lateral ventricle. No hydrocephalus or significant midline shift. No other enhancing lesion identified. Scattered foci of T2 hyperintensity are seen within the white matter of the cerebral hemispheres, nonspecific, most likely related to chronic small vessel ischemia. Vascular: Normal flow voids. Skull and upper cervical spine: Normal marrow signal. Sinuses/Orbits: Negative. 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  Electronically Signed   By: Pedro Earls M.D.   On: 06/30/2020 14:54   IR IVC FILTER PLMT / S&I Burke Keels GUID/MOD SED  Result Date: 07/05/2020 CLINICAL DATA:  80 year old male with history of subclinical pulmonary emboli and brain mass with forthcoming neuro surgical intervention  EXAM: 1. ULTRASOUND GUIDANCE FOR VASCULAR ACCESS OF THE RIGHT  VEIN. 2. IVC VENOGRAM. 3. PERCUTANEOUS IVC FILTER PLACEMENT. ANESTHESIA/SEDATION: One mg IV Versed; 25 mcg IV Fentanyl. Total Moderate Sedation Time Tenminutes. CONTRAST:  65mL OMNIPAQUE IOHEXOL 300 MG/ML  SOLN FLUOROSCOPY TIME:  54 seconds, 98 mGy PROCEDURE: The procedure, risks, benefits, and alternatives were explained to the patient. Questions regarding the procedure were encouraged and answered. The patient understands and consents to the procedure. The patient was prepped with Betadine in a sterile fashion, and a sterile drape was applied covering the operative field. A sterile gown and sterile gloves were used for the procedure. Local anesthesia was provided with 1% Lidocaine. Under direct ultrasound guidance, a 21 gauge needle was advanced into the right internal jugular vein with ultrasound image documentation performed. After securing access with a micropuncture dilator, a guidewire was advanced into the inferior vena cava. A deployment sheath was advanced over the guidewire. This was utilized to perform IVC venography. The deployment sheath was further positioned in an appropriate location for filter deployment. A Denali IVC filter was then advanced in the sheath. This was then fully deployed in the infrarenal IVC. Final filter position was confirmed with a fluoroscopic spot image. Contrast injection was also performed through the sheath under fluoroscopy to confirm patency of the IVC at the level of the filter. After the procedure the sheath was removed and hemostasis obtained with manual compression. COMPLICATIONS: None. FINDINGS: IVC venography  demonstrates a normal caliber IVC with no evidence of thrombus. Renal veins are identified bilaterally. The IVC filter was successfully positioned below the level of the renal veins and is appropriately oriented. This IVC filter has both permanent and retrievable indications. IMPRESSION: Placement of percutaneous IVC filter in infrarenal IVC. IVC venogram shows no evidence of IVC thrombus and normal caliber of the inferior vena cava. This filter does have both permanent and retrievable indications. PLAN: This IVC filter is potentially retrievable. The patient will be assessed for filter retrieval by Interventional Radiology in approximately 8-12 weeks. Further recommendations regarding filter retrieval, continued surveillance or declaration of device permanence, will be made at that time. Ruthann Cancer, MD [facility] Rad[facility] Electronically Signed   By: Ruthann Cancer MD   On: 07/05/2020 15:23   CT CHEST ABDOMEN PELVIS W CONTRAST  Result Date: 06/28/2020 CLINICAL DATA:  Brain lesion, assess for metastatic disease EXAM: CT CHEST, ABDOMEN, AND PELVIS WITH CONTRAST TECHNIQUE: Multidetector CT imaging of the chest, abdomen and pelvis was performed following the standard protocol during bolus administration of intravenous contrast. CONTRAST:  14mL OMNIPAQUE IOHEXOL 300 MG/ML  SOLN COMPARISON:  CT angiography 05/27/2020, CT abdomen and pelvis 04/27/2012 FINDINGS: CT CHEST FINDINGS Cardiovascular: Normal heart size. No pericardial effusion. Coronary artery calcifications are present. The aortic root is suboptimally assessed given cardiac pulsation artifact. Atherosclerotic plaque within the normal caliber aorta and proximal great vessels. No acute luminal abnormality of the imaged aorta. No periaortic stranding or hemorrhage. Normal 3 vessel branching of the aortic arch. Proximal great vessels are free of acute abnormality. Central pulmonary arteries are normal caliber.  Linear hypodense filling defect in the distal left main pulmonary artery at the bifurcation of the lingular and left lower lobar arteries (3/27) several additional smaller possible filling defects are seen pulmonary arteries of the left lower lobe (3/34) and right lower lobe (3/31). Truncation of the right posterior segmental pulmonary artery as it enters the region of the heterogeneous attenuating masslike opacity in the right upper  lobe. Additional narrowing of the adjacent pulmonary veins and hilar airways as well. Central pulmonary arteries remain normal caliber. RV/LV ratio is maintained. Mediastinum/Nodes: Mass within the right upper with extension along the right superior mediastinal margin extending into the right hilum and subcarinal space. There is associated narrowing of the superior hilar airways and vessels. A right superior hilar deposit measures up to 12 mm in size, previously 9 mm (3/23) increasing subcarinal extension as well narrowing the right mainstem bronchus with a subcarinal deposit measures approximately 2.3 cm on today's exam, previously 2.1 cm. No contralateral hilar adenopathy. The right lateral margin of the midthoracic esophagus is difficult to separate from this soft tissue mass. Lungs/Pleura: Large area of masslike opacity in the right upper lobe with extension along the right superior mediastinal margin and into the hilum and subcarinal space. This measures approximately 6.4 x 8 cm, previously 5.2 x 6.3 cm at similar levels. There is decreasing central cavitation now with a slightly more hypoattenuating necrotic appearance on today's exam. Associated narrowing of the hilar airways and vessels including some occlusion of the upper lobe airways centrally. Surrounding ground-glass and consolidative opacity may reflect some postobstructive pneumonia change. Some additional more patchy ground-glass opacities in the right upper lobe periphery are similar to prior. Stable 4 mm subpleural  nodules seen in the lateral and posterior right lower lobe. Remaining portions of the lungs are clear. No pneumothorax or pleural effusion. Minimal atelectasis. Musculoskeletal: No worrisome chest wall mass or lesion. No direct extension into the chest wall or adjacent osseous structures by the upper lobe mass. Multilevel degenerative changes are present in the imaged portions of the spine. Exaggerated thoracic kyphosis with some degenerative vertebral body height loss and multilevel Schmorl's node formations. No acute osseous abnormality or suspicious osseous lesion. CT ABDOMEN PELVIS FINDINGS Hepatobiliary: No worrisome focal liver lesions. Smooth liver surface contour. Normal hepatic attenuation. Normal gallbladder and biliary tree. Pancreas: No pancreatic ductal dilatation or surrounding inflammatory changes. 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 Adrenals/Urinary Tract: Hypoattenuating nodule in the medial limb of the right adrenal gland again measures up to 1 cm in size, unchanged since 2014 and strongly favoring a benign process such as adenoma rather than metastatic lesion. No concerning right adrenal lesion. Stable appearance of the multiple fluid attenuation cyst in a slightly more intermediate attenuation exophytic cyst arising from the lower pole left kidney. Mild symmetric bilateral perinephric stranding, a nonspecific finding which may correlate with advanced age or decreased renal function. No concerning renal mass, obstructive urolith or hydronephrosis. Multiple coarse calcifications layering dependently within the bladder may reflect bladder calculi though these were not present on comparison imaging. There is however some chronic indentation of the urinary bladder by an enlarged, heterogeneous prostate gland. Stomach/Bowel: Distal esophagus,  stomach and duodenal sweep are unremarkable. No small bowel wall thickening or dilatation. No evidence of obstruction. A normal appendix is visualized. No colonic dilatation or wall thickening. Vascular/Lymphatic: Atherosclerotic calcifications within the abdominal aorta and branch vessels. No aneurysm or ectasia. No enlarged abdominopelvic lymph nodes. Reproductive: Heterogeneous, enlarged prostate with few typically benign prostate calcifications. Indentation of the bladder base. Other: No abdominopelvic free fluid or free gas. No bowel containing hernias. Musculoskeletal: Multilevel degenerative changes are present in the imaged portions of the spine. Additional degenerative changes in the hips and pelvis. The osseous structures appear diffusely demineralized which may limit detection  of small or nondisplaced fractures. No acute osseous abnormality or suspicious osseous lesion. IMPRESSION: 1. Large area of masslike opacity in the right upper lobe with extension along the right superior mediastinal margin and into the right hilum and subcarinal space. This measures approximately 6.4 x 8 cm, previously 5.2 x 6.3 cm at similar levels. There is decreasing central cavitation now with a slightly more hypoattenuating necrotic appearance on today's exam. Findings are concerning for primary lung malignancy, particularly given the findings in the brain. Necrotic/atypical infection is less favored. Consider further evaluation with tissue sampling potentially via bronchoscopy. 2. Increasing subcarinal and right hilar extension with narrowing of the superior hilar airways and vessels including some occlusion of the right upper lobe airways. The right lateral margin of the midthoracic esophagus is difficult to separate from this soft tissue mass. 3. Some more peripheral areas of patchy consolidation interstitial opacity throughout the right upper lobe likely reflects a postobstructive process 4. Linear hypodense filling defect  in the distal left main pulmonary artery at the bifurcation of the lingular and left lower lobar arteries, several additional smaller possible filling defects are seen pulmonary arteries of the left lower lobe and right lower lobe (3/34) and right lower lobe (3/31). Findings are concerning for pulmonary emboli. No CT evidence of right heart strain within limitations of this exam. 5. Multiple coarse calcifications layering dependently within the bladder may reflect bladder calculi though these were not present on comparison imaging. No obstructive urolithiasis or hydronephrosis. 6. Heterogeneous, enlarged prostate gland with some chronic indentation of the urinary bladder base. Could correlate for features of outlet obstruction with urinalysis were clinically appropriate. Furthermore recommend assessment with prostate exam and PSA as warranted. 7. No other convincing evidence of primary malignancy or metastatic disease within the abdomen or pelvis. Electronically Signed   By: Lovena Le M.D.   On: 06/28/2020 01:40   DG Chest Portable 1 View  Result Date: 06/26/2020 CLINICAL DATA:  Fever, chills, weakness, cough EXAM: PORTABLE CHEST 1 VIEW COMPARISON:  05/27/2020 FINDINGS: Consolidation again seen in the right upper lobe compatible with pneumonia. This has improved since prior study. No confluent opacity on the left. Heart is normal size. Aortic atherosclerosis. No effusions. No acute bony abnormality. IMPRESSION: Continued but improving right upper lobe consolidation/pneumonia. Electronically Signed   By: Rolm Baptise M.D.   On: 06/26/2020 20:27   VAS [name] LOWER EXTREMITY VENOUS (DVT)  Result Date: 06/28/2020  Lower Venous DVT Study Indications: Pulmonary embolism.  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 Comparison Study: No prior venous studies available. Performing Technologist: Darlin Coco RDMS,RVT  Examination Guidelines: A complete evaluation includes B-mode imaging, spectral Doppler, color Doppler,  and power Doppler as needed of all accessible portions of each vessel. Bilateral testing is considered an integral part of a complete examination. Limited examinations for reoccurring indications may be performed as noted. The reflux portion of the exam is performed with the patient in reverse Trendelenburg.  +---------+---------------+---------+-----------+----------+--------------+ RIGHT    CompressibilityPhasicitySpontaneityPropertiesThrombus Aging +---------+---------------+---------+-----------+----------+--------------+ CFV      Full           Yes      Yes                                 +---------+---------------+---------+-----------+----------+--------------+ SFJ      Full                                                        +---------+---------------+---------+-----------+----------+--------------+  FV Prox  Full                                                        +---------+---------------+---------+-----------+----------+--------------+ FV Mid   Full                                                        +---------+---------------+---------+-----------+----------+--------------+ FV DistalFull                                                        +---------+---------------+---------+-----------+----------+--------------+ PFV      Full                                                        +---------+---------------+---------+-----------+----------+--------------+ POP      Full           Yes      Yes                                 +---------+---------------+---------+-----------+----------+--------------+ PTV      Full                                                        +---------+---------------+---------+-----------+----------+--------------+ PERO     Full                                                        +---------+---------------+---------+-----------+----------+--------------+    +---------+---------------+---------+-----------+----------+--------------+ LEFT     CompressibilityPhasicitySpontaneityPropertiesThrombus Aging +---------+---------------+---------+-----------+----------+--------------+ CFV      Full           Yes      Yes                                 +---------+---------------+---------+-----------+----------+--------------+ SFJ      Full                                                        +---------+---------------+---------+-----------+----------+--------------+ FV Prox  Full                                                        +---------+---------------+---------+-----------+----------+--------------+  FV Mid   Full                                                        +---------+---------------+---------+-----------+----------+--------------+ FV DistalFull                                                        +---------+---------------+---------+-----------+----------+--------------+ PFV      Full                                                        +---------+---------------+---------+-----------+----------+--------------+ POP      Full           Yes      Yes                                 +---------+---------------+---------+-----------+----------+--------------+ PTV                                                   BKA            +---------+---------------+---------+-----------+----------+--------------+ PERO                                                  BKA            +---------+---------------+---------+-----------+----------+--------------+     Summary: RIGHT: - There is no evidence of deep vein thrombosis in the lower extremity.  - No cystic structure found in the popliteal fossa.  LEFT: - There is no evidence of deep vein thrombosis in the lower extremity.  - No cystic structure found in the popliteal fossa.  *See table(s) above for measurements and observations. Electronically signed  by Deitra Mayo MD on 06/28/2020 at 3:46:57 PM.    Final     ASSESSMENT AND PLAN: This is a 80 year old male with progressive weakness, loss of appetite, and cough with hemoptysis who presented with a right lung mass and occipital brain lesion.  1.  Metastatic non-small cell lung cancer, adenocarcinoma 2.  Pulmonary embolism 3.  Microcytic anemia 4.  Leukocytosis secondary to steroids 5.  PAD status post left BKA 6.  Hypertension 7.  Uncontrolled diabetes mellitus 8.  GERD 9.  Status post splenectomy  -Discussed with the patient that he has outpatient follow-up already scheduled with Dr. Delton Coombes at the [facility] for medical oncology follow-up.  He will also see Dr. Mickeal Skinner for neuro oncology and Dr. Lisbeth Renshaw of radiation oncology for consideration of radiation to his brain and the lung mass. -Okay to discharge to home from our standpoint with outpatient follow-up as outlined above.   LOS: 18 days   Mikey Bussing, DNP, AGPCNP-BC,  AOCNP 07/14/20

## 2020-07-14 NOTE — Progress Notes (Signed)
Caris testing ordered per Dr. Delton Coombes.

## 2020-07-14 NOTE — Progress Notes (Signed)
Inpatient Diabetes Program Recommendations  AACE/ADA: New Consensus Statement on Inpatient Glycemic Control (2015)  Target Ranges:  Prepandial:   less than 140 mg/dL      Peak postprandial:   less than 180 mg/dL (1-2 hours)      Critically ill patients:  140 - 180 mg/dL   Lab Results  Component Value Date   GLUCAP 217 (H) 07/14/2020   HGBA1C 6.4 (H) 06/27/2020    Review of Glycemic Control Results for Thomas Garcia, Thomas "ED" (MRN [id number]) as of 07/14/2020 11:14  Ref. Range 07/13/2020 07:58 07/13/2020 11:50 07/13/2020 17:39 07/13/2020 21:23 07/14/2020 07:47  Glucose-Capillary Latest Ref Range: 70 - 99 mg/dL 181 (H) 161 (H) 143 (H) 217 (H) 217 (H)   Diabetes history: DM 2 Outpatient Diabetes medications:  Metformin 500 mg bid Current orders for Inpatient glycemic control:  Novolog resistant tid with meals and HS Decadron 2 mg q 12 hours Inpatient Diabetes Program Recommendations:   Consider restarting Lantus 10 units daily.  Thanks,  Adah Perl, RN, BC-ADM Inpatient Diabetes Coordinator Pager [phone] (8a-5p)

## 2020-07-14 NOTE — TOC Transition Note (Signed)
Transition of Care [facility]) - CM/SW Discharge Note   Patient Details  Name: Thomas Garcia MRN: [id number] Date of Birth: [birth year]  [facility]) CM/SW Contact:  Ella Bodo, RN Phone Number: 07/14/2020, 3:31 PM   Clinical Narrative:  Pt reaching medical stability for dc.  PT/OT recommending HH follow up; spoke with pt's son, Thomas Garcia, and he agrees to [facility] follow up.  He has no preference for agency.  Referral to [facility] for HHPT/OT at discharge.  No DME needs identified.       Final next level of care: Home w Home Health Services Barriers to Discharge: Barriers Resolved   Patient Goals and CMS Choice   CMS Medicare.gov Compare Post Acute Care list provided to:: Patient Represenative (must comment) (son) Choice offered to / list presented to : Adult Children  Discharge Placement                       Discharge Plan and Services   Discharge Planning Services: CM Consult Post Acute Care Choice: Home Health                    HH Arranged: PT,OT [place] Agency: [place] Date [facility] Agency Contacted: 07/14/20 Time [place]: 0045 Representative spoke with at [place]: Adela Lank  Social Determinants of Health ([place]) Interventions     Readmission Risk Interventions No flowsheet data found.  Reinaldo Raddle, RN, BSN  Trauma/Neuro ICU Case Manager [phone]

## 2020-07-14 NOTE — Progress Notes (Signed)
Patient's sons at bedside and this RN went over discharge paperwork with patient and sons.  IV taken out, no personal belongings left in room and discharge paperwork sent with patient and patient transported to private vehicle via wheelchair with nurse tech

## 2020-07-19 ENCOUNTER — Encounter (HOSPITAL_COMMUNITY): Payer: Self-pay

## 2020-07-19 NOTE — Progress Notes (Signed)
I attempted to place an introductory phone call to this patient, patient unavailable. I will plan to meet with the patient during his initial visit with Dr. Delton Coombes

## 2020-07-20 ENCOUNTER — Telehealth: Payer: Self-pay | Admitting: Radiation Therapy

## 2020-07-20 NOTE — Telephone Encounter (Signed)
Pt called to cancel upcoming oncology appointments with doctors Carola Frost and [place] due to a COVID exposure. Someone tested positive that he has recently been in contact with and now Thomas Garcia has started to experience symptoms of congestion and cough. I recommended he be tested for assurance and to let us know after the weekend how he is doing so we can get him back on the schedule to be seen.   Mont Dutton R.T.(R)(T) Radiation Special Procedures Navigator

## 2020-07-21 ENCOUNTER — Ambulatory Visit (HOSPITAL_COMMUNITY): Payer: Medicare HMO | Admitting: Hematology

## 2020-07-21 ENCOUNTER — Inpatient Hospital Stay: Payer: Medicare HMO | Admitting: Internal Medicine

## 2020-07-25 ENCOUNTER — Inpatient Hospital Stay: Payer: Medicare HMO

## 2020-07-26 ENCOUNTER — Other Ambulatory Visit: Payer: Self-pay | Admitting: Radiation Therapy

## 2020-07-26 ENCOUNTER — Ambulatory Visit: Payer: Medicare HMO

## 2020-07-26 ENCOUNTER — Ambulatory Visit: Payer: Medicare HMO | Admitting: Radiation Oncology

## 2020-07-26 DIAGNOSIS — C7931 Secondary malignant neoplasm of brain: Secondary | ICD-10-CM

## 2020-07-26 DIAGNOSIS — C7949 Secondary malignant neoplasm of other parts of nervous system: Secondary | ICD-10-CM

## 2020-07-27 ENCOUNTER — Telehealth: Payer: Self-pay | Admitting: *Deleted

## 2020-07-27 ENCOUNTER — Encounter: Payer: Self-pay | Admitting: Radiation Oncology

## 2020-07-27 ENCOUNTER — Institutional Professional Consult (permissible substitution): Payer: Medicare HMO | Admitting: Radiation Oncology

## 2020-07-27 NOTE — Telephone Encounter (Signed)
Spoke with the patient to check in on him since testing positive for Covid on 07/23/2020.  He states he is feeling a little better, continues to have nasal congestion.  He states his leg is swollen at the ankle and he has a nice size blister on it.  He notes some "sweating" on the side of his leg as well.  He was encouraged to reach out to his PCP to make them aware.  We discussed possible monoclonal infusion for covid.  At this time due to decreased mobility and other health concerns he declined infusion.  We are working to get him set up for post-op SRS treatment.  He was informed that he would hear from the Brain Navigator with further details.  He verbalized understanding.  Will continue to follow as necessary.  Gloriajean Dell. Leonie Green, BSN

## 2020-07-30 DIAGNOSIS — C7931 Secondary malignant neoplasm of brain: Secondary | ICD-10-CM | POA: Diagnosis not present

## 2020-07-30 DIAGNOSIS — C3411 Malignant neoplasm of upper lobe, right bronchus or lung: Secondary | ICD-10-CM | POA: Diagnosis not present

## 2020-08-03 NOTE — Progress Notes (Signed)
Thoracic Location of Tumor / Histology: RUL Lung Cancer with mets to brain  Patient presented to the emergency room with complaints of generalized weakness, fatigue, nausea, loss of appetite, and cough with scant hemoptysis.  MRI Brain 07/09/2020: Postoperative changes from interval right parieto-occipital craniotomy for tumor resection. Relatively smooth enhancement about the resection cavity favored to be postoperative in nature. No gross residual tumor evident at this time.  Residual vasogenic edema throughout the posterior right cerebral hemisphere, relatively similar as compared to preoperative exam.  Otherwise stable appearance of the brain, with no other new acute intracranial abnormality.  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  CT CAP 06/27/2020: Large area of mass-like opacity in the right upper lobe with extension along the right superior mediastinal margin into the right hilum and subcarinal space measuring approximately 6.4 x 8 cm (previously 5.2 x 6.3 cm)  CT Head 06/26/2020: 3.5 x 4.3 x 4.2 cm irregular heterogeneous mass at the right occipital cortex with associated hemorrhage and vasogenic edema.   Past or anticipated interventions, if any, per neurosurgery:  Dr. Reatha Armour -Right Occipital Craniotomy for tumor resection 07/08/2020  Past or anticipated interventions, if any, per medical oncology:  Dr. Delton Coombes 08/08/2020   Biopsies of Brain Mass 07/08/2020    Tobacco/Marijuana/Snuff/ETOH use: Current smoker, down to 4 cigarettes per day.  Dose of Decadron, if applicable: None  Recent neurologic symptoms, if any:   Seizures: No  Headaches: Once or twice a week.  Nausea: No  Dizziness/ataxia: No  Difficulty with hand coordination: Yes, has trouble picking up small objects.  Focal numbness/weakness:  Generalized weakness, right leg weaker, has trouble standing on it.  Visual deficits/changes: Decreased in all fields.  Confusion/Memory deficits: Some  Signs/Symptoms  Weight changes, if any: Feels like he has lost some but unable to quantify.  Respiratory complaints, if any: Continues to have SOB and cough.  Hemoptysis, if any: He reports coughing up a mixture of bright red and darker red blood daily.  He notes the amount is about the size of a quarter.  Pain issues, if any: None    SAFETY ISSUES:  Prior radiation? No  Pacemaker/ICD? No  Possible current pregnancy? n/a  Is the patient on methotrexate? No  Additional Complaints / other details:  -Left BKA

## 2020-08-04 ENCOUNTER — Encounter: Payer: Self-pay | Admitting: Radiation Oncology

## 2020-08-04 ENCOUNTER — Ambulatory Visit
Admission: RE | Admit: 2020-08-04 | Discharge: 2020-08-04 | Disposition: A | Discharge status: Home / Self Care | Payer: Medicare HMO | Source: Ambulatory Visit | Attending: Radiation Oncology | Admitting: Radiation Oncology

## 2020-08-04 ENCOUNTER — Other Ambulatory Visit: Payer: Self-pay

## 2020-08-04 VITALS — Wt 170.0 lb

## 2020-08-04 DIAGNOSIS — C714 Malignant neoplasm of occipital lobe: Secondary | ICD-10-CM

## 2020-08-04 DIAGNOSIS — C3411 Malignant neoplasm of upper lobe, right bronchus or lung: Secondary | ICD-10-CM | POA: Insufficient documentation

## 2020-08-04 DIAGNOSIS — C7931 Secondary malignant neoplasm of brain: Secondary | ICD-10-CM | POA: Diagnosis not present

## 2020-08-04 DIAGNOSIS — Z87891 Personal history of nicotine dependence: Secondary | ICD-10-CM | POA: Diagnosis not present

## 2020-08-04 NOTE — Progress Notes (Signed)
Radiation Oncology         [phone] ________________________________  Initial Outpatient Consultation - Conducted via telephone due to current COVID-19 concerns for limiting patient exposure  I spoke with the patient to conduct this consult visit via telephone to spare the patient unnecessary potential exposure in the healthcare setting during the current COVID-19 pandemic. The patient was notified in advance and was offered a [place] meeting to allow for face to face communication but unfortunately reported that they did not have the appropriate resources/technology to support such a visit and instead preferred to proceed with a telephone consult.  ________________________________  Name: Thomas Garcia        MRN: [id number]  Date of Service: 08/04/2020 DOB: Jan 08, 1941  FU:[id number], Doristine Johns, MD  Dawley, Theodoro Doing, DO     REFERRING PHYSICIAN: Dawley, Theodoro Doing, DO   DIAGNOSIS: The primary encounter diagnosis was Malignant neoplasm of occipital lobe ([place]). A diagnosis of Malignant neoplasm of upper lobe of right lung [facility]) was also pertinent to this visit.   HISTORY OF PRESENT ILLNESS: Thomas Garcia is a 80 y.o. male seen at the request of Dr. Reatha Armour for new diagnosis of stage IV non-small cell lung cancer, adenocarcinoma.  The patient was evaluated a month prior to his admission with shortness of breath and was found to have a small bilateral pulmonary embolism and a cavitary lesion in the right upper lobe.  The patient was offered admission but refused and went home.  He developed progressive generalized weakness fatigue nausea difficulty with appetite and confusion and presented to the ED and was admitted on 06/26/2020.  During that assessment a CT of the head showed a mass measuring up to 4.3 cm in the right occipital region and CT chest abdomen pelvis for staging showed a large masslike opacity in the right upper lobe measuring up to 8 cm in greatest dimension he also had subcarinal and hilar  adenopathy narrowing the superior hilar airways and vessels and was seen by pulmonology who recommended IR embolizing the mass if massive hemoptysis was developing out of concern for his anticoagulation.  He was seen by neurosurgery and underwent craniotomy on 07/08/2020, pathology was consistent with an adenocarcinoma of pulmonary primary.  He was discharged from the hospital on 07/14/2020 with plans to follow-up with medical oncology in [place].  He was seen last Thursday with Dr. Delton Coombes and with Dr. Mickeal Skinner.  His postop imaging of the brain with an MRI scan on 07/09/2020 showed postoperative changes in the right parieto-occipital craniotomy site with smooth enhancement about the resection cavity felt to be postoperative with no gross tumor seen.  There was residual vasogenic edema.  He has been taking Keppra and dexamethasone at 2 mg with taper instructions from Dr. Reatha Armour.  He is seen today to consider postop radiosurgery and palliative radiotherapy to the lung. He will meet Dr. Delton Coombes on Monday.     PREVIOUS RADIATION THERAPY: No   PAST MEDICAL HISTORY:  Past Medical History:  Diagnosis Date  . Diabetes mellitus without complication ([place])   . Kidney stones   . PONV (postoperative nausea and vomiting)        PAST SURGICAL HISTORY: Past Surgical History:  Procedure Laterality Date  . ABDOMINAL AORTOGRAM W/LOWER EXTREMITY N/A 09/17/2018   Procedure: ABDOMINAL AORTOGRAM W/LOWER EXTREMITY;  Surgeon: Marty Heck, MD;  Location: [place] [facility];  Service: Cardiovascular;  Laterality: N/A;  . AMPUTATION Left 09/22/2018   Procedure: AMPUTATION BELOW KNEE LEFT;  Surgeon:  Marty Heck, MD;  Location: [place];  Service: Vascular;  Laterality: Left;  . AMPUTATION TOE Left 09/10/2018   Procedure: AMPUTATION OF THIRD TOE LEFT FOOT, DEBRIDEMENT OF ULCERS ON SECOND TOE AND PARTIAL AMPUTATION SECOND TOE ON LEFT FOOT;  Surgeon: Virl Cagey, MD;  [facility];  Service:  General;  Laterality: Left;  . APPLICATION OF CRANIAL NAVIGATION N/A 07/08/2020   Procedure: APPLICATION OF CRANIAL NAVIGATION;  Surgeon: Dawley, Theodoro Doing, DO;  Location: [place];  Service: Neurosurgery;  Laterality: N/A;  . APPLICATION OF WOUND VAC Left 09/18/2018   Procedure: APPLICATION OF WOUND VAC;  Surgeon: Marty Heck, MD;  Location: [place];  Service: Vascular;  Laterality: Left;  . CRANIOTOMY Right 07/08/2020   Procedure: Right Occipital craniotomy for tumor resection with brainlab;  Surgeon: Dawley, Theodoro Doing, DO;  Location: [place];  Service: Neurosurgery;  Laterality: Right;  . CYSTOSCOPY  05/08/2012   Procedure: CYSTOSCOPY;  Surgeon: Marissa Nestle, MD;  [facility];  Service: Urology;  Laterality: N/A;  Evacuation of clots  . IR IVC FILTER PLMT / S&I /IMG GUID/MOD SED  07/05/2020  . KIDNEY SURGERY     sutures   . PERIPHERAL VASCULAR BALLOON ANGIOPLASTY  09/17/2018   Procedure: PERIPHERAL VASCULAR BALLOON ANGIOPLASTY;  Surgeon: Marty Heck, MD;  Location: [place] [facility];  Service: Cardiovascular;;  LT SFA and AT  . RADIOLOGY WITH ANESTHESIA N/A 06/30/2020   Procedure: MRI WITH ANESTHESIA   BRAIN WITH AND WITHOUT CONTRAST;  Surgeon: Radiologist, Medication, MD;  Location: [place];  Service: Radiology;  Laterality: N/A;  . SPLENECTOMY    . TRANSMETATARSAL AMPUTATION Left 09/18/2018   Procedure: TRANSMETATARSAL AMPUTATION LEFT FOOT;  Surgeon: Marty Heck, MD;  Location: [place];  Service: Vascular;  Laterality: Left;  . TRANSURETHRAL RESECTION OF PROSTATE  05/14/2012   Procedure: TRANSURETHRAL RESECTION OF THE PROSTATE (TURP);  Surgeon: Marissa Nestle, MD;  [facility];  Service: Urology;  Laterality: N/A;     FAMILY HISTORY:  Family History  Problem Relation Age of Onset  . Diabetes Mother      SOCIAL HISTORY:  reports that he quit smoking about 2 months ago. His smoking use included cigarettes. He has a 22.50 pack-year smoking history. He has never used  smokeless tobacco. He reports that he does not drink alcohol and does not use drugs.   ALLERGIES: Patient has no known allergies.   MEDICATIONS:  Current Outpatient Medications  Medication Sig Dispense Refill  . acetaminophen (TYLENOL) 325 MG tablet Take 2 tablets (650 mg total) by mouth every 4 (four) hours as needed for headache or mild pain. (Patient taking differently: Take 650 mg by mouth daily.)    . atorvastatin (LIPITOR) 10 MG tablet Take 1 tablet (10 mg total) by mouth daily at 6 PM. 90 tablet 1  . Cal Carb-Mag Hydrox-Simeth (ROLAIDS ADVANCED) 1000-200-40 MG CHEW Chew 1 tablet by mouth daily as needed (for indigestion).    . clopidogrel (PLAVIX) 75 MG tablet Take 1 tablet (75 mg total) by mouth daily. 90 tablet 1  . ferrous sulfate 324 MG TBEC Take 324 mg by mouth daily with breakfast.    . levETIRAcetam (KEPPRA) 500 MG tablet Take 1 tablet (500 mg total) by mouth 2 (two) times daily for 3 days. 6 tablet 0  . lisinopril (ZESTRIL) 5 MG tablet Take 1 tablet (5 mg total) by mouth daily. 90 tablet 1  . loperamide (IMODIUM) 2 MG capsule Take  1 capsule (2 mg total) by mouth as needed for diarrhea or loose stools. 30 capsule 0  . metFORMIN (GLUCOPHAGE) 500 MG tablet Take 1 tablet (500 mg total) by mouth 2 (two) times daily with a meal. 180 tablet 0  . nicotine (NICODERM CQ) 14 mg/24hr patch Place 1 patch (14 mg total) onto the skin daily. (Patient not taking: No sig reported) 28 patch 3  . pantoprazole (PROTONIX) 40 MG tablet Take 1 tablet (40 mg total) by mouth daily at 6 (six) AM. 30 tablet 0  . saccharomyces boulardii (FLORASTOR) 250 MG capsule Take 1 capsule (250 mg total) by mouth 2 (two) times daily. 30 capsule 0  . traZODone (DESYREL) 50 MG tablet Take 1 tablet (50 mg total) by mouth at bedtime. 30 tablet 3   No current facility-administered medications for this encounter.     REVIEW OF SYSTEMS: On review of systems, the patient reports that he is doing well overall. He reports  hemoptysis for months and has seen blood in his sputum more frequently each day. He reports  denies any chest pain, shortness of breath, cough, fevers, chills, night sweats. He has had some unintended weight changes and less appetite as well during his covid and prior. No other complaints are noted.    PHYSICAL EXAM:  Wt Readings from Last 3 Encounters:  06/26/20 180 lb (81.6 kg)  05/27/20 180 lb (81.6 kg)  04/14/20 187 lb (84.8 kg)   Unable to assess due to encounter type.  ECOG = 1  0 - Asymptomatic (Fully active, able to carry on all predisease activities without restriction)  1 - Symptomatic but completely ambulatory (Restricted in physically strenuous activity but ambulatory and able to carry out work of a light or sedentary nature. For example, light housework, office work)  2 - Symptomatic, <50% in bed during the day (Ambulatory and capable of all self care but unable to carry out any work activities. Up and about more than 50% of waking hours)  3 - Symptomatic, >50% in bed, but not bedbound (Capable of only limited self-care, confined to bed or chair 50% or more of waking hours)  4 - Bedbound (Completely disabled. Cannot carry on any self-care. Totally confined to bed or chair)  5 - Death   Eustace Pen MM, Creech RH, Tormey DC, et al. [id number]). "Toxicity and response criteria of the [facility]". [place] Oncol. 5 (6): 649-55    LABORATORY DATA:  Lab Results  Component Value Date   WBC 14.1 (H) 07/13/2020   HGB 10.6 (L) 07/13/2020   HCT 34.0 (L) 07/13/2020   MCV 77.1 (L) 07/13/2020   PLT 173 07/13/2020   Lab Results  Component Value Date   NA 136 07/13/2020   K 4.2 07/13/2020   CL 104 07/13/2020   CO2 27 07/13/2020   Lab Results  Component Value Date   ALT 11 06/26/2020   AST 12 (L) 06/26/2020   ALKPHOS 74 06/26/2020   BILITOT 0.4 06/26/2020      RADIOGRAPHY: MR BRAIN W WO CONTRAST  Result Date: 07/10/2020 CLINICAL DATA:  Follow-up  examination status post right-sided craniotomy for tumor resection. EXAM: MRI HEAD WITHOUT AND WITH CONTRAST TECHNIQUE: Multiplanar, multiecho pulse sequences of the brain and surrounding structures were obtained without and with intravenous contrast. CONTRAST:  7.69mL GADAVIST GADOBUTROL 1 MMOL/ML IV SOLN COMPARISON:  Previous brain MRI from 06/30/2020. FINDINGS: Brain: Postoperative changes from interval right parieto-occipital craniotomy. Small subdural collection subjacent to the craniotomy  bone flap measures up to 5 mm in thickness without significant mass effect. Superimposed postoperative pneumocephalus. Additional postoperative pneumocephalus overlies the right frontal convexity anteriorly. 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 Deep margin of the resection cavity extends to occipital horn of the right lateral ventricle, with adjacent trace intraventricular blood products. Smooth postcontrast enhancement seen about the resection cavity following contrast administration favored to in large part be postoperative. No obvious residual tumor identified. No significant infarct about the surgical site or other complication. Persistent vasogenic edema seen throughout the posterior right cerebral hemisphere, relatively similar as compared to preoperative exam at this time. No significant midline shift. Basilar cisterns remain patent. No hydrocephalus. Remainder the brain is stable in appearance with associated atrophy and chronic microvascular ischemic disease. No other infarct or other acute intracranial abnormality. No other abnormal enhancement. Vascular: Major intracranial vascular flow voids are maintained. Normal flow voids seen within the major dural sinuses. Skull and upper cervical spine: Craniocervical junction within normal limits. Bone marrow signal intensity normal. Post craniotomy  changes at the right posterior scalp without adverse features. Sinuses/Orbits: Globes and orbital soft tissues within normal limits. Paranasal sinuses are largely clear. Trace left mastoid effusion noted, of doubtful significance. Other: None. IMPRESSION: 1. Postoperative changes from interval right parieto-occipital craniotomy for tumor resection. Relatively smooth enhancement about the resection cavity favored to be postoperative in nature. No gross residual tumor evident at this time. 2. Residual vasogenic edema throughout the posterior right cerebral hemisphere, relatively similar as compared to preoperative exam. 3. Otherwise stable appearance of the brain, with no other new acute intracranial abnormality. Electronically Signed   By: Jeannine Boga M.D.   On: 07/10/2020 00:25   IR IVC FILTER PLMT / S&I [facility] Keels GUID/MOD SED  Result Date: 07/05/2020 CLINICAL DATA:  80 year old male with history of subclinical pulmonary emboli and brain mass with forthcoming neuro surgical intervention EXAM: 1. ULTRASOUND GUIDANCE FOR VASCULAR ACCESS OF THE RIGHT  VEIN. 2. IVC VENOGRAM. 3. PERCUTANEOUS IVC FILTER PLACEMENT. ANESTHESIA/SEDATION: One mg IV Versed; 25 mcg IV Fentanyl. Total Moderate Sedation Time Tenminutes. CONTRAST:  23mL OMNIPAQUE IOHEXOL 300 MG/ML  SOLN FLUOROSCOPY TIME:  54 seconds, 98 mGy PROCEDURE: The procedure, risks, benefits, and alternatives were explained to the patient. Questions regarding the procedure were encouraged and answered. The patient understands and consents to the procedure. The patient was prepped with Betadine in a sterile fashion, and a sterile drape was applied covering the operative field. A sterile gown and sterile gloves were used for the procedure. Local anesthesia was provided with 1% Lidocaine. Under direct ultrasound guidance, a 21 gauge needle was advanced into the right internal jugular vein with ultrasound image documentation performed. After securing access with a  micropuncture dilator, a guidewire was advanced into the inferior vena cava. A deployment sheath was advanced over the guidewire. This was utilized to perform IVC venography. The deployment sheath was further positioned in an appropriate location for filter deployment. A Denali IVC filter was then advanced in the sheath. This was then fully deployed in the infrarenal IVC. Final filter position was confirmed with a fluoroscopic spot image. Contrast injection was also performed through the sheath under fluoroscopy to confirm patency of the IVC at the level of the filter. After the procedure the sheath was removed and hemostasis obtained with manual compression. COMPLICATIONS: None. FINDINGS: IVC venography demonstrates a normal caliber IVC with no evidence of thrombus. Renal veins  are identified bilaterally. The IVC filter was successfully positioned below the level of the renal veins and is appropriately oriented. This IVC filter has both permanent and retrievable indications. IMPRESSION: Placement of percutaneous IVC filter in infrarenal IVC. IVC venogram shows no evidence of IVC thrombus and normal caliber of the inferior vena cava. This filter does have both permanent and retrievable indications. PLAN: This IVC filter is potentially retrievable. The patient will be assessed for filter retrieval by Interventional Radiology in approximately 8-12 weeks. Further recommendations regarding filter retrieval, continued surveillance or declaration of device permanence, will be made at that time. Ruthann Cancer, MD [facility] Electronically Signed   By: Ruthann Cancer MD   On: 07/05/2020 15:23       IMPRESSION/PLAN: 1. 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 Dr. Lisbeth Renshaw discusses the pathology findings and reviews the nature of metastatic lung disease. He recommends postoperative SRS treatment to the surgical cavity. He also reviews the  rationale for palliative radiotherapy to the RUL tumor.  We discussed the risks, benefits, short, and long term effects of radiotherapy, as well as the palliative intent, and the patient is interested in proceeding. Dr. Lisbeth Renshaw discusses the delivery and logistics of radiotherapy and anticipates a course of 10 fractions to the lung, and 3 fractions of postoperative SRS radiotherapy. He will simulate on 08/15/20 for his lung. He is going for treatment planning MRI on 08/22/20, and same day simulation for his brain treatment which will begin on 08/25/20. 2. Transportation. We will coordinate with the department to try to coordinate help as he does not drive and does not know how to get around [place].  Given current concerns for patient exposure during the COVID-19 pandemic, this encounter was conducted via telephone.  The patient has provided two factor identification and has given verbal consent for this type of encounter and has been advised to only accept a meeting of this type in a secure network environment. The time spent during this encounter was 60 minutes including preparation, discussion, and coordination of the patient's care. The attendants for this meeting include Blenda Nicely, RN, Dr. Lisbeth Renshaw, Hayden Pedro  and [name].  During the encounter,  Blenda Nicely, RN, Dr. Lisbeth Renshaw, and Hayden Pedro were located at [facility] Radiation Oncology Department.  [name] was located at home.   The above documentation reflects my direct findings during this shared patient visit. Please see the separate note by Dr. Lisbeth Renshaw on this date for the remainder of the patient's plan of care.    Carola Rhine, [facility]   **Disclaimer: This note was dictated with voice recognition software. Similar sounding words can inadvertently be transcribed and this note may contain transcription errors which may not have been corrected upon publication of note.**

## 2020-08-04 NOTE — Addendum Note (Signed)
Encounter addended by: Cori Razor, RN on: 08/04/2020 9:49 AM  Actions taken: Order list changed, Diagnosis association updated

## 2020-08-05 ENCOUNTER — Encounter: Payer: Self-pay | Admitting: General Practice

## 2020-08-05 ENCOUNTER — Telehealth: Payer: Self-pay | Admitting: Internal Medicine

## 2020-08-05 DIAGNOSIS — Z483 Aftercare following surgery for neoplasm: Secondary | ICD-10-CM | POA: Diagnosis not present

## 2020-08-05 DIAGNOSIS — E1165 Type 2 diabetes mellitus with hyperglycemia: Secondary | ICD-10-CM | POA: Diagnosis not present

## 2020-08-05 DIAGNOSIS — C3411 Malignant neoplasm of upper lobe, right bronchus or lung: Secondary | ICD-10-CM | POA: Diagnosis not present

## 2020-08-05 DIAGNOSIS — I619 Nontraumatic intracerebral hemorrhage, unspecified: Secondary | ICD-10-CM | POA: Diagnosis not present

## 2020-08-05 DIAGNOSIS — D72829 Elevated white blood cell count, unspecified: Secondary | ICD-10-CM | POA: Diagnosis not present

## 2020-08-05 DIAGNOSIS — I2699 Other pulmonary embolism without acute cor pulmonale: Secondary | ICD-10-CM | POA: Diagnosis not present

## 2020-08-05 DIAGNOSIS — E1151 Type 2 diabetes mellitus with diabetic peripheral angiopathy without gangrene: Secondary | ICD-10-CM | POA: Diagnosis not present

## 2020-08-05 DIAGNOSIS — G936 Cerebral edema: Secondary | ICD-10-CM | POA: Diagnosis not present

## 2020-08-05 DIAGNOSIS — C7931 Secondary malignant neoplasm of brain: Secondary | ICD-10-CM | POA: Diagnosis not present

## 2020-08-05 NOTE — Telephone Encounter (Signed)
   MIDAS DAUGHETY DOB: 11-11-1940 MRN: [id number]   RIDER WAIVER AND RELEASE OF LIABILITY  For purposes of improving physical access to our facilities, [facility] is pleased to partner with third parties to provide [facility] patients or other authorized individuals the option of convenient, on-demand ground transportation services (the [place]") through use of the technology service that enables users to request on-demand ground transportation from independent third-party providers.  By opting to use and accept these [employer], I, the undersigned, hereby agree on behalf of myself, and on behalf of any minor child using the [employer] for whom I am the parent or legal guardian, as follows:  1. Government social research officer provided to me are provided by independent third-party transportation providers who are not [employer] or employees and who are unaffiliated with [employer]. 2. [facility] is neither a transportation carrier nor a common or public carrier. 3. [facility] has no control over the quality or safety of the transportation that occurs as a result of the [employer]. 4. [facility] cannot guarantee that any third-party transportation provider will complete any arranged transportation service. 5. [facility] makes no representation, warranty, or guarantee regarding the reliability, timeliness, quality, safety, suitability, or availability of any of the Transport Services or that they will be error free. 6. I fully understand that traveling by vehicle involves risks and dangers of serious bodily injury, including permanent disability, paralysis, and death. I agree, on behalf of myself and on behalf of any minor child using the Transport Services for whom I am the parent or legal guardian, that the entire risk arising out of my use of the [employer] remains solely with me, to the maximum extent permitted under applicable law. 7. The [employer] are provided "as is" and "as available." [facility] disclaims all representations and warranties, express, implied or statutory, not expressly set out in these terms, including the implied warranties of merchantability and fitness for a particular purpose. 8. I hereby waive and release [facility], its agents, employees, officers, directors, representatives, insurers, attorneys, assigns, successors, subsidiaries, and affiliates from any and all past, present, or future claims, demands, liabilities, actions, causes of action, or suits of any kind directly or indirectly arising from acceptance and use of the [employer]. 9. I further waive and release [facility] and its affiliates from all present and future liability and responsibility for any injury or death to persons or damages to property caused by or related to the use of the [employer]. 10. I have read this Waiver and Release of Liability, and I understand the terms used in it and their legal significance. This Waiver is freely and voluntarily given with the understanding that my right (as well as the right of any minor child for whom I am the parent or legal guardian using the [employer]) to legal recourse against  in connection with the [employer] is knowingly surrendered in return for use of these services.   I attest that I read the consent document to [name], gave Mr. Kersh the opportunity to ask questions and answered the questions asked (if any). I affirm that [name] then provided consent for he's participation in this program.     Katy Apo

## 2020-08-05 NOTE — Progress Notes (Signed)
[place] Work  Initial Assessment   Thomas Garcia is a 80 y.o. year old male contacted by phone. Clinical Social Work was referred by radiation oncology for assessment of psychosocial needs.   SDOH (Social Determinants of Health) assessments performed: Yes SDOH Interventions   Flowsheet Row Most Recent Value  SDOH Interventions   Food Insecurity Interventions Intervention Not Indicated  Financial Strain Interventions Intervention Not Indicated  Housing Interventions Intervention Not Indicated  Social Connections Interventions Intervention Not Indicated  Transportation Interventions [facility]      Distress Screen completed: No No flowsheet data found.    Family/Social Information:  . Housing Arrangement: patient lives with brother and son.  Is able to ambulate in wheelchair at home, cannot walk.  Has a ramp at the house.  Does not use often due to  . Family members/support persons in your life? Brother lives with him, brother has COPD and is physically limited. Rest of his family is deceased other than one brother in [place] and brother at home.  Brother and son help help him out.  Mostly stressful thing is his inability to get around on his own, has increased stress from family members who are now caring for him.  "It all happened at once."  Has had falls at home - unexpected, "there I was face first on the carpet", passed out.   . Transportation concerns: Is on [employer].   Has amputated leg, uses wheelchair.  Has used [employer] in the past.   . Employment: Retired.  . Income source: retirement income, used to work in Theatre manager for [employer], retired in 2008 . Financial concerns: No o Type of concern: None . Food access concerns: He is concerned about food due to his "uncontrollable bowel movements."  He is not sure what to eat in order to address this.  "I don't want to eat because I get disgusted."  Uses incontinence diapers.  Has  diabetes, followed by Dr Anastasio Champion.   . Religious or spiritual practice: None . Medication Concerns:None at this time.  Gets at [employer].   . Services Currently in place:  He will have home health in place.  They will "check on his legs to find out why they are swelling."  [name] is providing home health services. Currently had visit from PT, he will request [facility] RN.  PCP is N Gosrani in [place]  Coping/ Adjustment to diagnosis: . Patient understands treatment plan and what happens next? 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  Plan is for palliative radiotherapy.   Feels confused and forgetful.  Needs someone to keep track of his appointments as he has difficulty keeping track of them.  He wants to have his son receive appointment reminders.  They have MyChart access but find it confusing.  CSW will alert treatment teams to need to communicate information through son vs patient.  . Concerns about diagnosis and/or treatment: no idea what to expect, "I dont know what is going on  Major concern is remember appointments and arranging transportation.  Cannot write down appointments, has difficulty remembering.  . Patient reported stressors: Transportation and family relationships . Hopes and priorities: "everyone in my family is OK", worries about ex wife, "no one else gets this", main concern is for others.  . Patient enjoys watching TV and watches movies on YouTube, depends on others to help him get outside, this does not happen often due to the mobility difficulty he has .  Current coping skills/ strengths: Other: Works to solve his own problems, wants to be independent    SUMMARY: Current SDOH Barriers:  . Limited social support, Transportation, Social Isolation and difficulty remembering appointments  Clinical Social Work Clinical Goal(s):  . patient will work with SW to address concerns related to treatment challenges  Interventions: . Discussed common feeling  and emotions when being diagnosed with cancer, and the importance of support during treatment . Informed patient of the support team roles and support services at [facility] . Provided CSW contact information and encouraged patient to call with any questions or concerns . Referred patient to [facility], he wants to wait on being contact by them as he has limited ability to manage input from multiple sources.    Follow Up Plan: Patient will contact CSW with any support or resource needs Patient verbalizes understanding of plan: Yes    Beverely Pace , [place], LCSW Clinical Social Worker Phone:  [phone]

## 2020-08-07 DIAGNOSIS — D72829 Elevated white blood cell count, unspecified: Secondary | ICD-10-CM | POA: Diagnosis not present

## 2020-08-07 DIAGNOSIS — C3411 Malignant neoplasm of upper lobe, right bronchus or lung: Secondary | ICD-10-CM | POA: Diagnosis not present

## 2020-08-07 DIAGNOSIS — E1165 Type 2 diabetes mellitus with hyperglycemia: Secondary | ICD-10-CM | POA: Diagnosis not present

## 2020-08-07 DIAGNOSIS — Z483 Aftercare following surgery for neoplasm: Secondary | ICD-10-CM | POA: Diagnosis not present

## 2020-08-07 DIAGNOSIS — C7931 Secondary malignant neoplasm of brain: Secondary | ICD-10-CM | POA: Diagnosis not present

## 2020-08-07 DIAGNOSIS — E1151 Type 2 diabetes mellitus with diabetic peripheral angiopathy without gangrene: Secondary | ICD-10-CM | POA: Diagnosis not present

## 2020-08-07 DIAGNOSIS — I619 Nontraumatic intracerebral hemorrhage, unspecified: Secondary | ICD-10-CM | POA: Diagnosis not present

## 2020-08-07 DIAGNOSIS — I2699 Other pulmonary embolism without acute cor pulmonale: Secondary | ICD-10-CM | POA: Diagnosis not present

## 2020-08-07 DIAGNOSIS — G936 Cerebral edema: Secondary | ICD-10-CM | POA: Diagnosis not present

## 2020-08-08 ENCOUNTER — Inpatient Hospital Stay (HOSPITAL_COMMUNITY): Payer: Medicare HMO | Admitting: Hematology

## 2020-08-08 ENCOUNTER — Other Ambulatory Visit: Payer: Self-pay | Admitting: Radiation Therapy

## 2020-08-08 DIAGNOSIS — C7949 Secondary malignant neoplasm of other parts of nervous system: Secondary | ICD-10-CM

## 2020-08-08 DIAGNOSIS — C7931 Secondary malignant neoplasm of brain: Secondary | ICD-10-CM

## 2020-08-09 ENCOUNTER — Telehealth (INDEPENDENT_AMBULATORY_CARE_PROVIDER_SITE_OTHER): Payer: Self-pay

## 2020-08-09 DIAGNOSIS — G936 Cerebral edema: Secondary | ICD-10-CM | POA: Diagnosis not present

## 2020-08-09 DIAGNOSIS — I2699 Other pulmonary embolism without acute cor pulmonale: Secondary | ICD-10-CM | POA: Diagnosis not present

## 2020-08-09 DIAGNOSIS — E1151 Type 2 diabetes mellitus with diabetic peripheral angiopathy without gangrene: Secondary | ICD-10-CM | POA: Diagnosis not present

## 2020-08-09 DIAGNOSIS — I619 Nontraumatic intracerebral hemorrhage, unspecified: Secondary | ICD-10-CM | POA: Diagnosis not present

## 2020-08-09 DIAGNOSIS — E1165 Type 2 diabetes mellitus with hyperglycemia: Secondary | ICD-10-CM | POA: Diagnosis not present

## 2020-08-09 DIAGNOSIS — Z483 Aftercare following surgery for neoplasm: Secondary | ICD-10-CM | POA: Diagnosis not present

## 2020-08-09 DIAGNOSIS — C3411 Malignant neoplasm of upper lobe, right bronchus or lung: Secondary | ICD-10-CM | POA: Diagnosis not present

## 2020-08-09 DIAGNOSIS — D72829 Elevated white blood cell count, unspecified: Secondary | ICD-10-CM | POA: Diagnosis not present

## 2020-08-09 DIAGNOSIS — C7931 Secondary malignant neoplasm of brain: Secondary | ICD-10-CM | POA: Diagnosis not present

## 2020-08-09 NOTE — Telephone Encounter (Signed)
Diane a nurse with [facility] called and stated that this patient has several weeping wounds and wanted to know if we have treated the patient for them.  I called Diane back and let her know that we have not seen this patient since January 2022 and he does have a follow up appointment on 08/25/2020. Diane is very concerned about this patient and has several questions and would like to confirm treatment with Dr. Anastasio Champion.  Can you please call Diane at [phone] to go over treatment plans as she has questions that I will not be able to answer. Thank you!

## 2020-08-09 NOTE — Telephone Encounter (Signed)
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 Patient is currently going through chemo from brain mass removal and having assessment of lung mass.  Please advise if OK to have nurse go out for assessment of patient.

## 2020-08-09 NOTE — Telephone Encounter (Signed)
Yes, nurse can go and see him as long as obviously she takes all the precautions for COVID-19 disease.

## 2020-08-09 NOTE — Telephone Encounter (Signed)
I spoke with the nurse seeing this patient.  The patient is mostly bedbound, has a brother who lives at the house now but the nurse does not feel comfortable that the patient will be even able to get to the appointments that have been made for him.  I am supposed to see the patient on 08/25/2020.  Although I have not seen the patient since January 2022, I have reviewed the chart and I feel that this patient may be a candidate for hospice services.  We will have more information once he hopefully completes his appointment with the oncologist on 08/22/2020.

## 2020-08-09 NOTE — Telephone Encounter (Signed)
[facility] and gave him the verbal OK per Dr. Anastasio Champion. Ben verbalized an understanding.

## 2020-08-11 ENCOUNTER — Telehealth (INDEPENDENT_AMBULATORY_CARE_PROVIDER_SITE_OTHER): Payer: Self-pay

## 2020-08-11 DIAGNOSIS — E1165 Type 2 diabetes mellitus with hyperglycemia: Secondary | ICD-10-CM | POA: Diagnosis not present

## 2020-08-11 DIAGNOSIS — I619 Nontraumatic intracerebral hemorrhage, unspecified: Secondary | ICD-10-CM | POA: Diagnosis not present

## 2020-08-11 DIAGNOSIS — C7931 Secondary malignant neoplasm of brain: Secondary | ICD-10-CM | POA: Diagnosis not present

## 2020-08-11 DIAGNOSIS — E1151 Type 2 diabetes mellitus with diabetic peripheral angiopathy without gangrene: Secondary | ICD-10-CM | POA: Diagnosis not present

## 2020-08-11 DIAGNOSIS — G936 Cerebral edema: Secondary | ICD-10-CM | POA: Diagnosis not present

## 2020-08-11 DIAGNOSIS — C3411 Malignant neoplasm of upper lobe, right bronchus or lung: Secondary | ICD-10-CM | POA: Diagnosis not present

## 2020-08-11 DIAGNOSIS — I2699 Other pulmonary embolism without acute cor pulmonale: Secondary | ICD-10-CM | POA: Diagnosis not present

## 2020-08-11 DIAGNOSIS — D72829 Elevated white blood cell count, unspecified: Secondary | ICD-10-CM | POA: Diagnosis not present

## 2020-08-11 DIAGNOSIS — Z483 Aftercare following surgery for neoplasm: Secondary | ICD-10-CM | POA: Diagnosis not present

## 2020-08-11 NOTE — Telephone Encounter (Signed)
Called Sharyn Lull to give the verbal OK per Dr. Anastasio Champion for OT orders. Sharyn Lull verbalized an understanding.  Sharyn Lull also wanted to let you know that the patients right leg is really weeping and swollen when she saw him to complete his OT evaluation and she talked to him today and told her that he is coughing up darker red blood and not sure how long this has been going on.  Please advise.

## 2020-08-11 NOTE — Telephone Encounter (Signed)
Called [place] back and gave her the message per Dr. Anastasio Champion. Sharyn Lull verbalized that she will reach out and send a message to the PT and nurse as they will be visiting the patient tomorrow and can evaluate and advise the patient.

## 2020-08-11 NOTE — Telephone Encounter (Signed)
Sharyn Lull Occupational Therapist with [facility] called and left a detailed voice message requesting verbal orders for OT for:  1 time a week for 4 weeks to focus on energy conservation and improving independence with ADL scales. [phone]  Please advise if OK to approve orders.

## 2020-08-11 NOTE — Telephone Encounter (Signed)
The hemoptysis may well be because of his lung cancer.  If the OT feels that he is worse or in distress, he should go to the emergency room.

## 2020-08-11 NOTE — Telephone Encounter (Signed)
Please go ahead and give the verbal order for the Occupational Therapy.  Thanks.

## 2020-08-12 DIAGNOSIS — I2699 Other pulmonary embolism without acute cor pulmonale: Secondary | ICD-10-CM | POA: Diagnosis not present

## 2020-08-12 DIAGNOSIS — C7931 Secondary malignant neoplasm of brain: Secondary | ICD-10-CM | POA: Diagnosis not present

## 2020-08-12 DIAGNOSIS — Z483 Aftercare following surgery for neoplasm: Secondary | ICD-10-CM | POA: Diagnosis not present

## 2020-08-12 DIAGNOSIS — E1151 Type 2 diabetes mellitus with diabetic peripheral angiopathy without gangrene: Secondary | ICD-10-CM | POA: Diagnosis not present

## 2020-08-12 DIAGNOSIS — G936 Cerebral edema: Secondary | ICD-10-CM | POA: Diagnosis not present

## 2020-08-12 DIAGNOSIS — I619 Nontraumatic intracerebral hemorrhage, unspecified: Secondary | ICD-10-CM | POA: Diagnosis not present

## 2020-08-12 DIAGNOSIS — D72829 Elevated white blood cell count, unspecified: Secondary | ICD-10-CM | POA: Diagnosis not present

## 2020-08-12 DIAGNOSIS — C3411 Malignant neoplasm of upper lobe, right bronchus or lung: Secondary | ICD-10-CM | POA: Diagnosis not present

## 2020-08-12 DIAGNOSIS — E1165 Type 2 diabetes mellitus with hyperglycemia: Secondary | ICD-10-CM | POA: Diagnosis not present

## 2020-08-15 ENCOUNTER — Ambulatory Visit
Admission: RE | Admit: 2020-08-15 | Discharge: 2020-08-15 | Disposition: A | Discharge status: Home / Self Care | Payer: Medicare HMO | Source: Ambulatory Visit | Attending: Radiation Oncology | Admitting: Radiation Oncology

## 2020-08-15 ENCOUNTER — Other Ambulatory Visit: Payer: Self-pay

## 2020-08-15 ENCOUNTER — Ambulatory Visit (HOSPITAL_COMMUNITY): Payer: Medicare HMO

## 2020-08-15 ENCOUNTER — Ambulatory Visit: Payer: Medicare HMO | Admitting: Radiation Oncology

## 2020-08-15 ENCOUNTER — Ambulatory Visit: Payer: Medicare HMO

## 2020-08-15 DIAGNOSIS — C3411 Malignant neoplasm of upper lobe, right bronchus or lung: Secondary | ICD-10-CM | POA: Diagnosis not present

## 2020-08-15 DIAGNOSIS — C714 Malignant neoplasm of occipital lobe: Secondary | ICD-10-CM | POA: Insufficient documentation

## 2020-08-15 DIAGNOSIS — Z510 Encounter for antineoplastic radiation therapy: Secondary | ICD-10-CM | POA: Insufficient documentation

## 2020-08-15 DIAGNOSIS — Z87891 Personal history of nicotine dependence: Secondary | ICD-10-CM | POA: Diagnosis not present

## 2020-08-15 DIAGNOSIS — C7949 Secondary malignant neoplasm of other parts of nervous system: Secondary | ICD-10-CM

## 2020-08-15 DIAGNOSIS — C7931 Secondary malignant neoplasm of brain: Secondary | ICD-10-CM | POA: Diagnosis not present

## 2020-08-15 LAB — BUN & CREATININE (CHCC)
BUN: 12 mg/dL (ref 8–23)
Creatinine: 0.72 mg/dL (ref 0.61–1.24)
GFR, Estimated: >60 mL/min (ref >60)

## 2020-08-16 DIAGNOSIS — E1165 Type 2 diabetes mellitus with hyperglycemia: Secondary | ICD-10-CM | POA: Diagnosis not present

## 2020-08-16 DIAGNOSIS — I2699 Other pulmonary embolism without acute cor pulmonale: Secondary | ICD-10-CM | POA: Diagnosis not present

## 2020-08-16 DIAGNOSIS — I619 Nontraumatic intracerebral hemorrhage, unspecified: Secondary | ICD-10-CM | POA: Diagnosis not present

## 2020-08-16 DIAGNOSIS — E1151 Type 2 diabetes mellitus with diabetic peripheral angiopathy without gangrene: Secondary | ICD-10-CM | POA: Diagnosis not present

## 2020-08-16 DIAGNOSIS — C3411 Malignant neoplasm of upper lobe, right bronchus or lung: Secondary | ICD-10-CM | POA: Diagnosis not present

## 2020-08-16 DIAGNOSIS — C7931 Secondary malignant neoplasm of brain: Secondary | ICD-10-CM | POA: Diagnosis not present

## 2020-08-16 DIAGNOSIS — D72829 Elevated white blood cell count, unspecified: Secondary | ICD-10-CM | POA: Diagnosis not present

## 2020-08-16 DIAGNOSIS — Z483 Aftercare following surgery for neoplasm: Secondary | ICD-10-CM | POA: Diagnosis not present

## 2020-08-16 DIAGNOSIS — G936 Cerebral edema: Secondary | ICD-10-CM | POA: Diagnosis not present

## 2020-08-17 DIAGNOSIS — E1165 Type 2 diabetes mellitus with hyperglycemia: Secondary | ICD-10-CM | POA: Diagnosis not present

## 2020-08-17 DIAGNOSIS — I619 Nontraumatic intracerebral hemorrhage, unspecified: Secondary | ICD-10-CM | POA: Diagnosis not present

## 2020-08-17 DIAGNOSIS — D72829 Elevated white blood cell count, unspecified: Secondary | ICD-10-CM | POA: Diagnosis not present

## 2020-08-17 DIAGNOSIS — C3411 Malignant neoplasm of upper lobe, right bronchus or lung: Secondary | ICD-10-CM | POA: Diagnosis not present

## 2020-08-17 DIAGNOSIS — E1151 Type 2 diabetes mellitus with diabetic peripheral angiopathy without gangrene: Secondary | ICD-10-CM | POA: Diagnosis not present

## 2020-08-17 DIAGNOSIS — C7931 Secondary malignant neoplasm of brain: Secondary | ICD-10-CM | POA: Diagnosis not present

## 2020-08-17 DIAGNOSIS — Z483 Aftercare following surgery for neoplasm: Secondary | ICD-10-CM | POA: Diagnosis not present

## 2020-08-17 DIAGNOSIS — I2699 Other pulmonary embolism without acute cor pulmonale: Secondary | ICD-10-CM | POA: Diagnosis not present

## 2020-08-17 DIAGNOSIS — G936 Cerebral edema: Secondary | ICD-10-CM | POA: Diagnosis not present

## 2020-08-18 ENCOUNTER — Telehealth (INDEPENDENT_AMBULATORY_CARE_PROVIDER_SITE_OTHER): Payer: Self-pay

## 2020-08-18 ENCOUNTER — Ambulatory Visit: Payer: Medicare HMO | Admitting: Radiation Oncology

## 2020-08-18 NOTE — Telephone Encounter (Signed)
Sarah with [facility] called and left a detailed voice message that the patient has several wounds and wanted to know the diagnosis code for these. [phone]  I consulted with Dr. Anastasio Champion and called them back to let them know that we have not seen this patient since 04/14/2020 and will need to see the patient for a hospital follow up to be able to help with diagnosis code and care of patient. I spoke to [facility] and she verbalized an understanding.

## 2020-08-19 DIAGNOSIS — Z483 Aftercare following surgery for neoplasm: Secondary | ICD-10-CM | POA: Diagnosis not present

## 2020-08-19 DIAGNOSIS — I2699 Other pulmonary embolism without acute cor pulmonale: Secondary | ICD-10-CM | POA: Diagnosis not present

## 2020-08-19 DIAGNOSIS — G936 Cerebral edema: Secondary | ICD-10-CM | POA: Diagnosis not present

## 2020-08-19 DIAGNOSIS — E1151 Type 2 diabetes mellitus with diabetic peripheral angiopathy without gangrene: Secondary | ICD-10-CM | POA: Diagnosis not present

## 2020-08-19 DIAGNOSIS — I619 Nontraumatic intracerebral hemorrhage, unspecified: Secondary | ICD-10-CM | POA: Diagnosis not present

## 2020-08-19 DIAGNOSIS — Z510 Encounter for antineoplastic radiation therapy: Secondary | ICD-10-CM | POA: Diagnosis not present

## 2020-08-19 DIAGNOSIS — Z87891 Personal history of nicotine dependence: Secondary | ICD-10-CM | POA: Diagnosis not present

## 2020-08-19 DIAGNOSIS — C7931 Secondary malignant neoplasm of brain: Secondary | ICD-10-CM | POA: Diagnosis not present

## 2020-08-19 DIAGNOSIS — C714 Malignant neoplasm of occipital lobe: Secondary | ICD-10-CM | POA: Diagnosis not present

## 2020-08-19 DIAGNOSIS — C3411 Malignant neoplasm of upper lobe, right bronchus or lung: Secondary | ICD-10-CM | POA: Diagnosis not present

## 2020-08-19 DIAGNOSIS — E1165 Type 2 diabetes mellitus with hyperglycemia: Secondary | ICD-10-CM | POA: Diagnosis not present

## 2020-08-19 DIAGNOSIS — D72829 Elevated white blood cell count, unspecified: Secondary | ICD-10-CM | POA: Diagnosis not present

## 2020-08-20 DIAGNOSIS — Z20822 Contact with and (suspected) exposure to covid-19: Secondary | ICD-10-CM | POA: Diagnosis not present

## 2020-08-20 NOTE — Progress Notes (Shared)
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  HISTORY OF PRESENTING ILLNESS:  Thomas Garcia 80 y.o. male is here because of an evaluation of his malignant neoplasm of the occipital lobe and malignant neoplasm of the right lung, at the request of Dr. Burr Medico.  Today he reports feeling  MEDICAL HISTORY:  Past Medical History:  Diagnosis Date  . Diabetes mellitus without complication ([place])   . Kidney stones   . Lung cancer ([place]) 2022  . PONV (postoperative nausea and vomiting)     SURGICAL HISTORY: Past Surgical History:  Procedure Laterality Date  . ABDOMINAL AORTOGRAM W/LOWER EXTREMITY N/A 09/17/2018   Procedure: ABDOMINAL AORTOGRAM W/LOWER EXTREMITY;  Surgeon: Marty Heck, MD;  Location: [place] [facility];  Service: Cardiovascular;  Laterality: N/A;  . AMPUTATION Left 09/22/2018   Procedure: AMPUTATION BELOW KNEE LEFT;  Surgeon: Marty Heck, MD;  Location: [place];  Service: Vascular;  Laterality: Left;  . AMPUTATION TOE Left 09/10/2018   Procedure: AMPUTATION OF THIRD TOE LEFT FOOT, DEBRIDEMENT OF ULCERS ON SECOND TOE AND PARTIAL AMPUTATION SECOND TOE ON LEFT FOOT;  Surgeon: Virl Cagey, MD;  Location: [facility];  Service: General;  Laterality: Left;  . APPLICATION OF CRANIAL NAVIGATION N/A 07/08/2020   Procedure: APPLICATION OF CRANIAL NAVIGATION;  Surgeon: Dawley, Theodoro Doing, DO;  Location: [place];  Service: Neurosurgery;  Laterality: N/A;  . APPLICATION OF WOUND VAC Left 09/18/2018   Procedure: APPLICATION OF WOUND VAC;  Surgeon: Marty Heck, MD;  Location: [place];  Service: Vascular;   Laterality: Left;  . CRANIOTOMY Right 07/08/2020   Procedure: Right Occipital craniotomy for tumor resection with brainlab;  Surgeon: Dawley, Theodoro Doing, DO;  Location: [place];  Service: Neurosurgery;  Laterality: Right;  . CYSTOSCOPY  05/08/2012   Procedure: CYSTOSCOPY;  Surgeon: Marissa Nestle, MD;  Location: [facility];  Service: Urology;  Laterality: N/A;  Evacuation of clots  . IR IVC FILTER PLMT / S&I /IMG GUID/MOD SED  07/05/2020  . KIDNEY SURGERY     sutures   . PERIPHERAL VASCULAR BALLOON ANGIOPLASTY  09/17/2018   Procedure: PERIPHERAL VASCULAR BALLOON ANGIOPLASTY;  Surgeon: Marty Heck, MD;  Location: [place] [facility];  Service: Cardiovascular;;  LT SFA and AT  . RADIOLOGY WITH ANESTHESIA N/A 06/30/2020   Procedure: MRI WITH ANESTHESIA   BRAIN WITH AND WITHOUT CONTRAST;  Surgeon: Radiologist, Medication, MD;  Location: [place];  Service: Radiology;  Laterality: N/A;  . SPLENECTOMY    . TRANSMETATARSAL AMPUTATION Left 09/18/2018   Procedure: TRANSMETATARSAL AMPUTATION LEFT FOOT;  Surgeon: Marty Heck, MD;  Location: [place];  Service: Vascular;  Laterality: Left;  . TRANSURETHRAL RESECTION OF PROSTATE  05/14/2012   Procedure: TRANSURETHRAL RESECTION OF THE PROSTATE (TURP);  Surgeon: Marissa Nestle, MD;  Location: [facility];  Service: Urology;  Laterality: N/A;    SOCIAL HISTORY: Social History   Socioeconomic History  . Marital status: Legally Separated    Spouse name: Not on file  . Number of children: Not on file  . Years of education: Not on file  . Highest education level: Not on file  Occupational History  . Not on file  Tobacco Use  . Smoking status: Former Smoker    Packs/day: 0.50    Years: 45.00    Pack years: 22.50    Types: Cigarettes  . Smokeless tobacco: Never Used  . Tobacco comment: 4 cigarettes per day  Vaping Use  . Vaping Use: Never used  Substance and Sexual Activity  . Alcohol use: No  . Drug use: No  . Sexual activity: Not on file  Other  Topics Concern  . Not on file  Social History Narrative   Divorced since 2011.Lives with brother.Retired,previously maintenance work for [employer]   Social Determinants of Health   Financial Resource Strain: Low Risk   . Difficulty of Paying Living Expenses: Not very hard  Food Insecurity: No Food Insecurity  . Worried About Charity fundraiser in the Last Year: Never true  . Ran Out of Food in the Last Year: Never true  Transportation Needs: Unmet Transportation Needs  . Lack of Transportation (Medical): Yes  . Lack of Transportation (Non-Medical): Yes  Physical Activity: Not on file  Stress: Not on file  Social Connections: Socially Isolated  . Frequency of Communication with Friends and Family: More than three times a week  . Frequency of Social Gatherings with Friends and Family: More than three times a week  . Attends Religious Services: Never  . Active Member of Clubs or Organizations: No  . Attends Archivist Meetings: Never  . Marital Status: Separated  Intimate Partner Violence: Not At Risk  . Fear of Current or Ex-Partner: No  . Emotionally Abused: No  . Physically Abused: No  . Sexually Abused: No    FAMILY HISTORY: Family History  Problem Relation Age of Onset  . Diabetes Mother     ALLERGIES:  has No Known Allergies.  MEDICATIONS:  Current Outpatient Medications  Medication Sig Dispense Refill  . acetaminophen (TYLENOL) 325 MG tablet Take 2 tablets (650 mg total) by mouth every 4 (four) hours as needed for headache or mild pain. (Patient taking differently: Take 650 mg by mouth daily.)    . atorvastatin (LIPITOR) 10 MG tablet Take 1 tablet (10 mg total) by mouth daily at 6 PM. 90 tablet 1  . Cal Carb-Mag Hydrox-Simeth (ROLAIDS ADVANCED) 1000-200-40 MG CHEW Chew 1 tablet by mouth daily as needed (for indigestion).    . clopidogrel (PLAVIX) 75 MG tablet Take 1 tablet (75 mg total) by mouth daily. 90 tablet 1  . ferrous sulfate 324 MG TBEC  Take 324 mg by mouth daily with breakfast.    . levETIRAcetam (KEPPRA) 500 MG tablet Take 1 tablet (500 mg total) by mouth 2 (two) times daily for 3 days. 6 tablet 0  . lisinopril (ZESTRIL) 5 MG tablet Take 1 tablet (5 mg total) by mouth daily. 90 tablet 1  . loperamide (IMODIUM) 2 MG capsule Take 1 capsule (2 mg total) by mouth as needed for diarrhea or loose stools. 30 capsule 0  . metFORMIN (GLUCOPHAGE) 500 MG tablet Take 1 tablet (500 mg total) by mouth 2 (two) times daily with a meal. 180 tablet 0  . nicotine (NICODERM CQ) 14 mg/24hr patch Place 1 patch (14 mg total) onto the skin daily. (Patient not taking: No sig reported) 28 patch 3  . pantoprazole (PROTONIX) 40 MG tablet Take 1 tablet (40 mg total) by mouth daily at 6 (six) AM. 30 tablet 0  . saccharomyces boulardii (FLORASTOR) 250 MG capsule Take 1 capsule (  250 mg total) by mouth 2 (two) times daily. 30 capsule 0  . traZODone (DESYREL) 50 MG tablet Take 1 tablet (50 mg total) by mouth at bedtime. 30 tablet 3   No current facility-administered medications for this visit.    REVIEW OF SYSTEMS:   Review of Systems  All other systems reviewed and are negative.    PHYSICAL EXAMINATION: ECOG PERFORMANCE STATUS: {CHL ONC ECOG PS:[phone]}  There were no vitals filed for this visit. There were no vitals filed for this visit. Physical Exam Vitals reviewed.  Constitutional:      Appearance: Normal appearance.  Cardiovascular:     Rate and Rhythm: Normal rate and regular rhythm.     Pulses: Normal pulses.     Heart sounds: Normal heart sounds.  Pulmonary:     Effort: Pulmonary effort is normal.     Breath sounds: Normal breath sounds.  Neurological:     General: No focal deficit present.     Mental Status: He is alert and oriented to person, place, and time.  Psychiatric:        Mood and Affect: Mood normal.        Behavior: Behavior normal.      LABORATORY DATA:  I have reviewed the data as listed CBC Latest Ref Rng &  Units 07/13/2020 07/09/2020 07/08/2020  WBC 4.0 - 10.5 K/uL 14.1(H) 23.9(H) -  Hemoglobin 13.0 - 17.0 g/dL 10.6(L) 10.3(L) 10.9(L)  Hematocrit 39.0 - 52.0 % 34.0(L) 33.2(L) 32.0(L)  Platelets 150 - 400 K/uL 173 248 -   CMP Latest Ref Rng & Units 08/15/2020 07/13/2020 07/09/2020  Glucose 70 - 99 mg/dL - 123(H) 116(H)  BUN 8 - 23 mg/dL 12 18 20   Creatinine 0.61 - 1.24 mg/dL 0.72 0.50(L) 0.83  Sodium 135 - 145 mmol/L - 136 136  Potassium 3.5 - 5.1 mmol/L - 4.2 4.2  Chloride 98 - 111 mmol/L - 104 107  CO2 22 - 32 mmol/L - 27 25  Calcium 8.9 - 10.3 mg/dL - 8.0(L) 7.7(L)  Total Protein 6.5 - 8.1 g/dL - - -  Total Bilirubin 0.3 - 1.2 mg/dL - - -  Alkaline Phos 38 - 126 U/L - - -  AST 15 - 41 U/L - - -  ALT 0 - 44 U/L - - -    RADIOGRAPHIC STUDIES: I have personally reviewed the radiological images as listed and agreed with the findings in the report. No results found.  ASSESSMENT:   1. Malignant neoplasm of occipital lobe  2. Malignant neoplasm of upper lobe of right lung   PLAN:   1. Malignant neoplasm of occipital lobe  2. Malignant neoplasm of upper lobe of right lung   All questions were answered. The patient knows to call the clinic with any problems, questions or concerns. I spent {CHL ONC TIME VISIT - RUEAV:[id number]} counseling the patient face to face. The total time spent in the appointment was {CHL ONC TIME VISIT - WGNFA:[id number]} and more than 50% was on counseling.   Derek Jack, MD, 08/20/20 2:59 PM  [place] [phone]   I, Thana Ates, am acting as a scribe for Dr. Derek Jack.  {Add Barista Statement}

## 2020-08-22 ENCOUNTER — Inpatient Hospital Stay (HOSPITAL_COMMUNITY): Payer: Medicare HMO | Attending: Hematology | Admitting: Hematology

## 2020-08-22 ENCOUNTER — Ambulatory Visit
Admission: RE | Admit: 2020-08-22 | Discharge: 2020-08-22 | Disposition: A | Discharge status: Home / Self Care | Payer: Medicare HMO | Source: Ambulatory Visit | Attending: Radiation Oncology | Admitting: Radiation Oncology

## 2020-08-22 ENCOUNTER — Other Ambulatory Visit: Payer: Self-pay

## 2020-08-22 ENCOUNTER — Ambulatory Visit: Payer: Medicare HMO | Admitting: Radiation Oncology

## 2020-08-22 ENCOUNTER — Ambulatory Visit: Admission: RE | Admit: 2020-08-22 | Payer: Medicare HMO | Source: Ambulatory Visit | Admitting: Radiation Oncology

## 2020-08-22 ENCOUNTER — Ambulatory Visit (HOSPITAL_COMMUNITY)
Admission: RE | Admit: 2020-08-22 | Discharge: 2020-08-22 | Disposition: A | Discharge status: Home / Self Care | Payer: Medicare HMO | Source: Ambulatory Visit | Attending: Radiation Oncology | Admitting: Radiation Oncology

## 2020-08-22 VITALS — BP 112/55 | HR 107 | Temp 98.2°F | Resp 18

## 2020-08-22 DIAGNOSIS — Z87891 Personal history of nicotine dependence: Secondary | ICD-10-CM | POA: Diagnosis not present

## 2020-08-22 DIAGNOSIS — C714 Malignant neoplasm of occipital lobe: Secondary | ICD-10-CM

## 2020-08-22 DIAGNOSIS — C3411 Malignant neoplasm of upper lobe, right bronchus or lung: Secondary | ICD-10-CM

## 2020-08-22 DIAGNOSIS — C7949 Secondary malignant neoplasm of other parts of nervous system: Secondary | ICD-10-CM | POA: Diagnosis not present

## 2020-08-22 DIAGNOSIS — C7931 Secondary malignant neoplasm of brain: Secondary | ICD-10-CM | POA: Diagnosis not present

## 2020-08-22 DIAGNOSIS — G9389 Other specified disorders of brain: Secondary | ICD-10-CM | POA: Diagnosis not present

## 2020-08-22 DIAGNOSIS — Z510 Encounter for antineoplastic radiation therapy: Secondary | ICD-10-CM | POA: Diagnosis not present

## 2020-08-22 IMAGING — MR MR HEAD WO/W CM
10 of 14 series · 20 of 48 positions shown · IV contrast (cc gad)
Comparison: [DATE].

CLINICAL DATA: Brain/CNS neoplasm

EXAM:
MRI HEAD WITHOUT AND WITH CONTRAST
TECHNIQUE: Multiplanar, multiecho pulse sequences of the brain and surrounding
structures were obtained without and with intravenous contrast.
CONTRAST:  7mL GADAVIST GADOBUTROL 1 MMOL/ML IV SOLN

[Series 2: FLAIR · sagittal · 3.0mm · 0.47mm/px · 1 of 45 slices shown (1 of 2)]
[im 1/45]
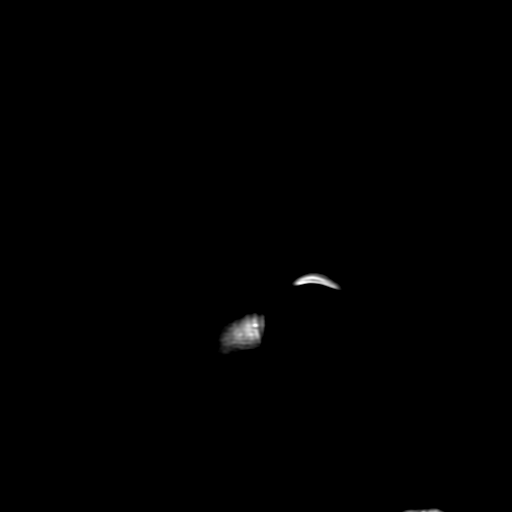

[Series 3: DWI · axial · 3.0mm · 0.94mm/px · z∈[-56,+88]mm · 2 of 102 slices shown]
[im 1/102]
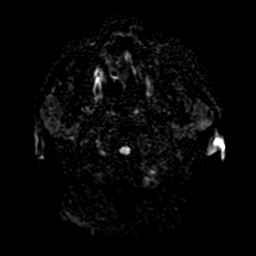
[im 102/102]
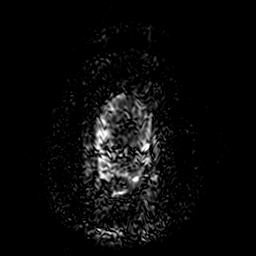

[Series 4: FLAIR · axial · 3.0mm · 0.47mm/px · z∈[-90,+75]mm · 2 of 56 slices shown (2 of 2)]
[im 1/56]
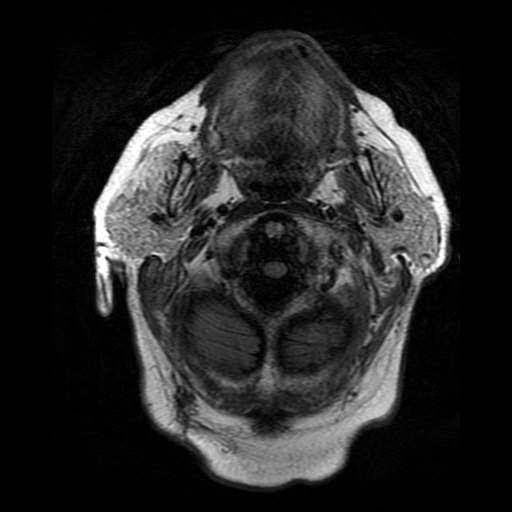
[im 56/56]
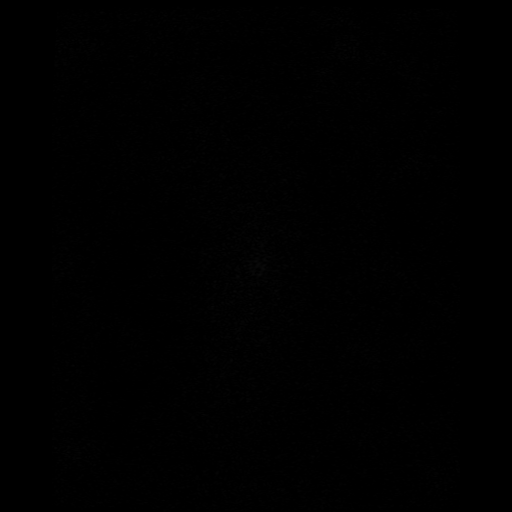

[Series 5: SWI · axial · 2.9mm · 0.47mm/px · z∈[-50,+22]mm · 2 of 102 slices shown]
[im 1/102]
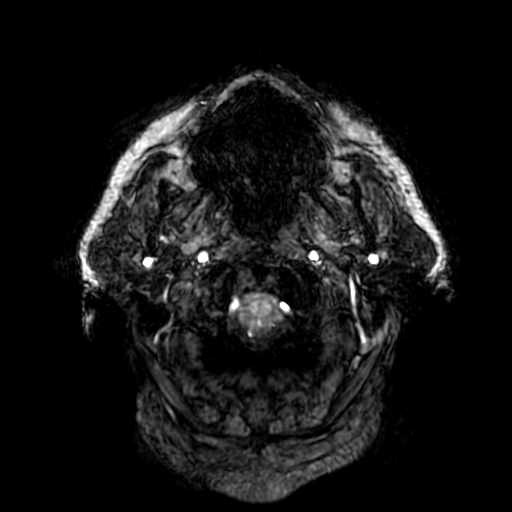
[im 51/102]
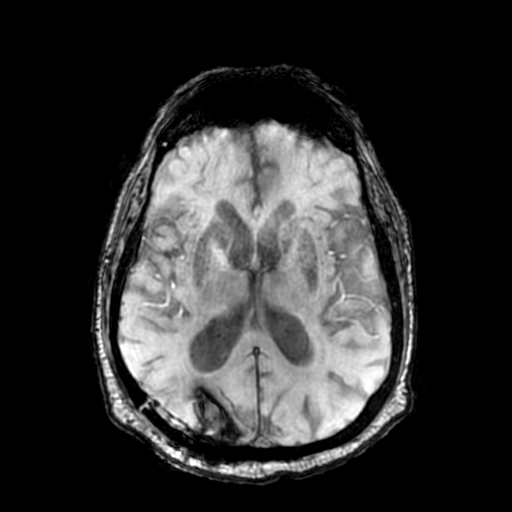

[Series 7: T2 post-contrast · coronal · 3.0mm · 0.39mm/px · 1 of 45 slices shown (1 of 2)]
[im 1/45]
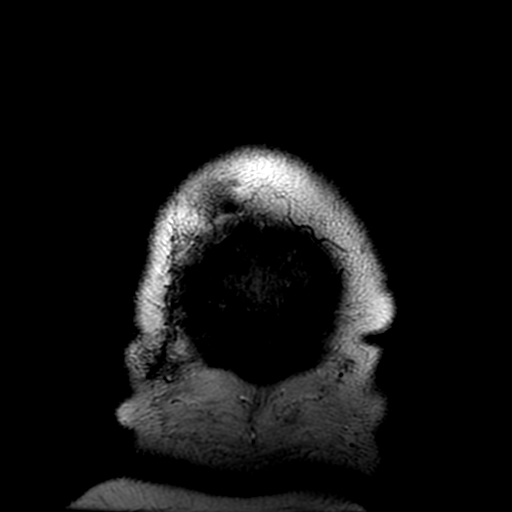

[Series 8: T2 post-contrast · axial · 5.0mm · 0.47mm/px · 1 of 27 slices shown (2 of 2)]
[im 1/27]
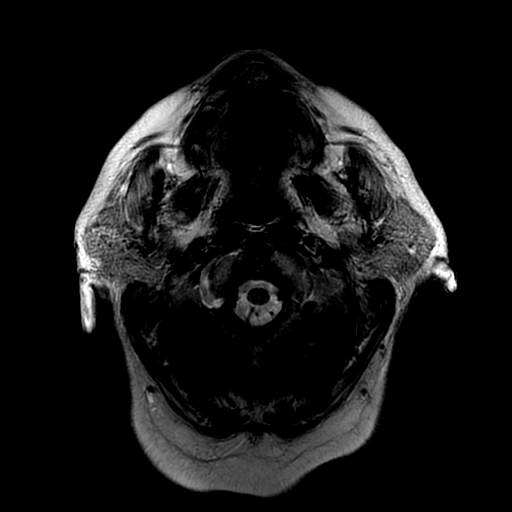

[Series 9: T1 post-contrast · coronal · 3.0mm · 0.43mm/px · 1 of 45 slices shown (1 of 2)]
[im 1/45]
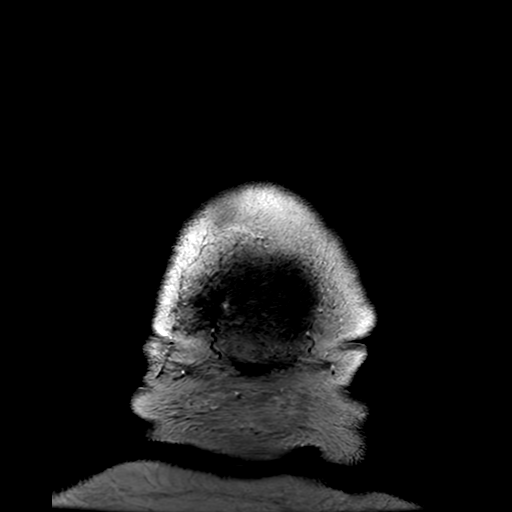

[Series 10: FLAIR post-contrast · sagittal · 3.0mm · 0.47mm/px · 1 of 45 slices shown]
[im 1/45]
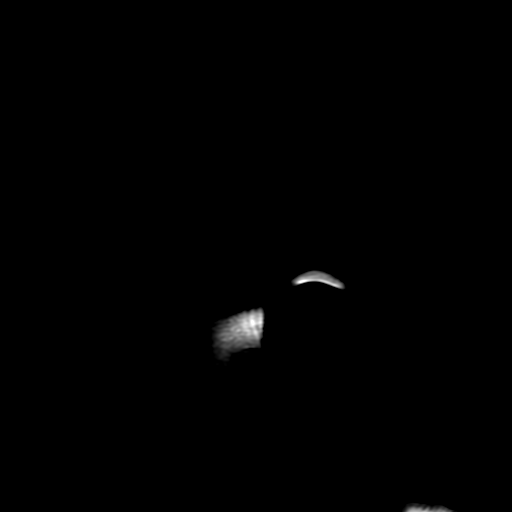

[Series 350: ADC · axial · 3.0mm · 0.94mm/px · 1 of 51 slices shown]
[im 1/51]
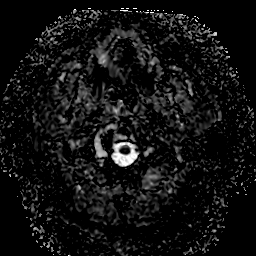

[Series 1100: T1 post-contrast · axial · 0.9mm · 0.50mm/px · z∈[-164,+90]mm · 8 of 301 slices shown (2 of 2)]
[im 1/301]
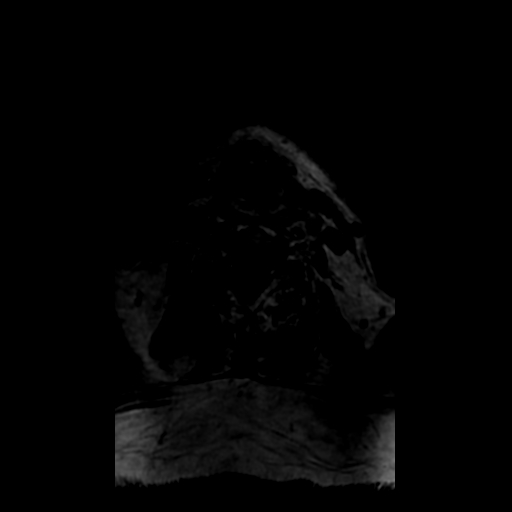
[im 43/301]
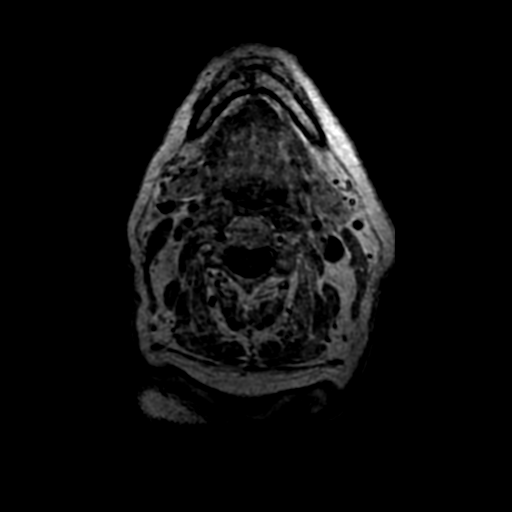
[im 86/301]
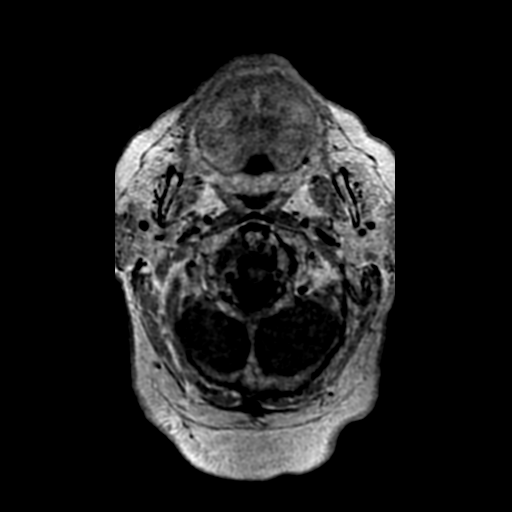
[im 129/301]
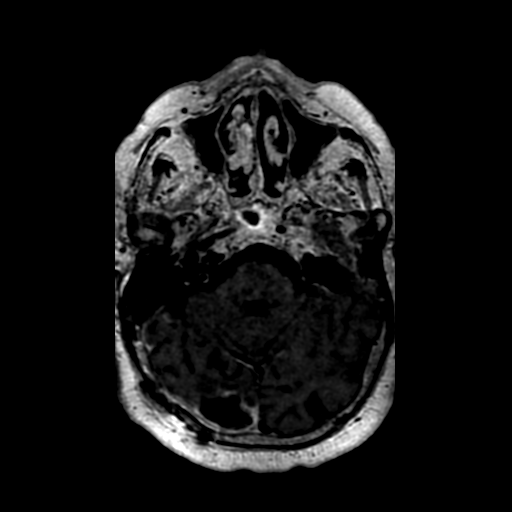
[im 172/301]
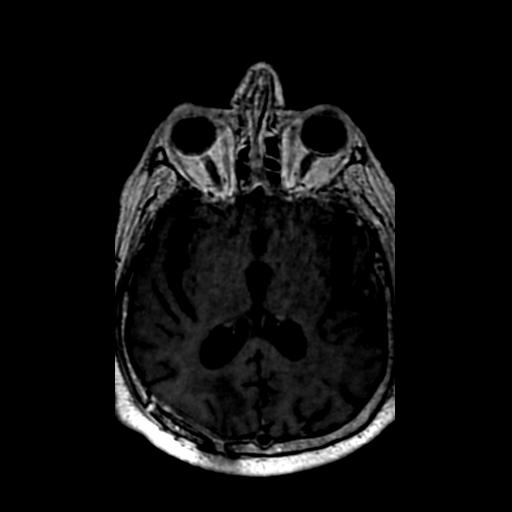
[im 215/301]
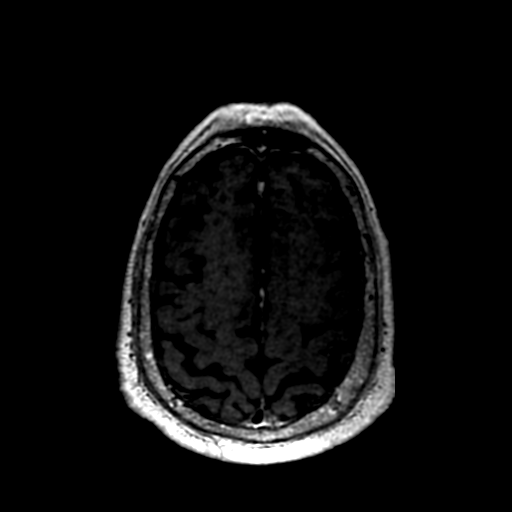
[im 258/301]
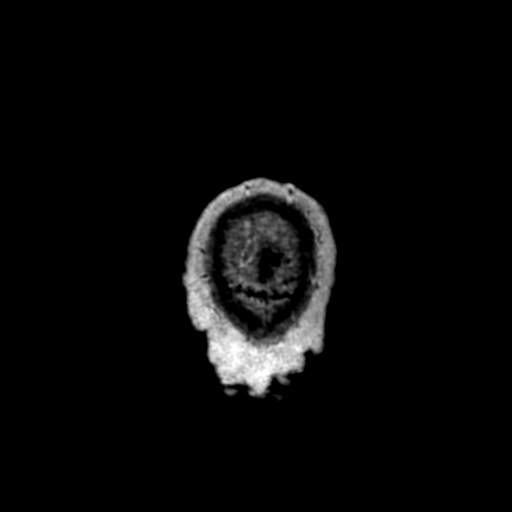
[im 301/301]
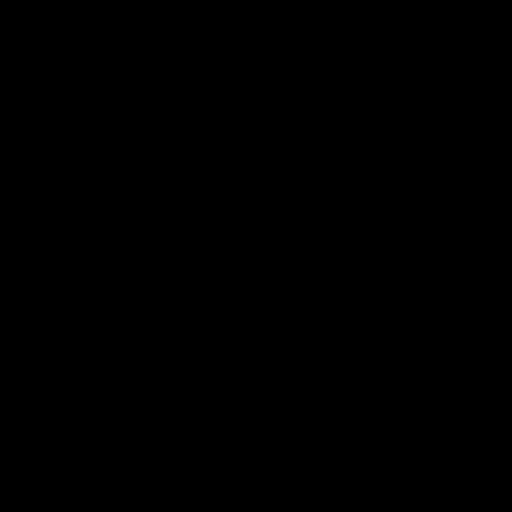

[20 of 48 positions shown; findings below may reference images not displayed]

FINDINGS: Brain: New 1.6 cm hemorrhagic lesion in the right cerebellum (series
8, image 4) with surrounding edema and faint enhancement. Additional
5 mm hemorrhagic lesion in the inferior left cerebellum (series 8,
image 3) with punctate focus of enhancement (series [DATE],
image 95). No substantial mass effect with either lesion.

Prominent curvilinear area of enhancement overlying the left
posterior parietal lobe (series 11, image 94 and series [WV], image
179) appears extra-axial and is favored to represent a prominent
vessel.

Status post right parieto-occipital craniotomy for resection of a
previously seen hemorrhagic lesion. Similar appearance of the
resection cavity with improved surrounding T2/FLAIR hyperintensity.
Similar linear peripheral enhancement about the resection cavity
without new nodular or masslike enhancement to suggest recurrence.
Mild, improved restricted diffusion along the periphery of the
resection cavity, likely evolving postoperative devitalized tissue
and/or residual blood products. Similar small volume of layering
right intraventricular hemorrhage.

No evidence of interval acute infarct. No midline shift. No
hydrocephalus. No extra-axial fluid collection. Small remote lacunar
infarct in the left thalamus.

Vascular: Major arterial flow voids are maintained skull base.

Skull and upper cervical spine: Right parieto-occipital craniotomy.

Sinuses/Orbits: Mild inferior maxillary sinus mucosal thickening.
Otherwise, clear sinuses. Polypoid soft tissue lesion in the right
inferior nasal meatus, similar to prior.

Other: No sizable mastoid effusions.
IMPRESSION: 1. Two new hemorrhagic lesions in the cerebellum, described above
and compatible with hemorrhagic metastases. The larger 1.6 cm right
cerebellar lesion has surrounding edema without substantial mass
effect.
2. Similar appearance of the right parieto-occipital resection
cavity with similar smooth peripheral enhancement and improved
surrounding edema. No nodular or masslike enhancement to suggest
local recurrence.
3. Prominent curvilinear area of enhancement overlying the left
posterior parietal lobe appears extra-axial and is favored to
represent a prominent vessel. Recommend attention on follow-up.
4. Polypoid soft tissue lesion in the right inferior nasal meatus,
similar to prior. While nonspecific, this may represent a polyp.
This could be correlated with direct inspection if clinically
indicated.

## 2020-08-22 MED ORDER — GADOBUTROL 1 MMOL/ML IV SOLN
7.0000 mL | Freq: Once | INTRAVENOUS | Status: AC | PRN
  Administered 2020-08-22: 7 mL via INTRAVENOUS

## 2020-08-22 MED ORDER — SODIUM CHLORIDE 0.9% FLUSH
10.0000 mL | Freq: Once | INTRAVENOUS | Status: AC
  Administered 2020-08-22: 10 mL via INTRAVENOUS

## 2020-08-22 NOTE — Progress Notes (Signed)
Has armband been applied?  Yes  Does patient have an allergy to IV contrast dye?: No   Has patient ever received premedication for IV contrast dye?:n/a  Does patient take metformin?:   If patient does take metformin when was the last dose:   Date of lab work: 08/15/2020 BUN: 12 CR: 0.72 eGFR: >60  IV site: Left AC  Has IV site been added to flowsheet?  Yes

## 2020-08-23 ENCOUNTER — Ambulatory Visit
Admission: RE | Admit: 2020-08-23 | Discharge: 2020-08-23 | Disposition: A | Discharge status: Home / Self Care | Payer: Medicare HMO | Source: Ambulatory Visit | Attending: Radiation Oncology | Admitting: Radiation Oncology

## 2020-08-23 DIAGNOSIS — C3411 Malignant neoplasm of upper lobe, right bronchus or lung: Secondary | ICD-10-CM | POA: Diagnosis not present

## 2020-08-23 DIAGNOSIS — I619 Nontraumatic intracerebral hemorrhage, unspecified: Secondary | ICD-10-CM | POA: Diagnosis not present

## 2020-08-23 DIAGNOSIS — E1151 Type 2 diabetes mellitus with diabetic peripheral angiopathy without gangrene: Secondary | ICD-10-CM | POA: Diagnosis not present

## 2020-08-23 DIAGNOSIS — C7931 Secondary malignant neoplasm of brain: Secondary | ICD-10-CM | POA: Diagnosis not present

## 2020-08-23 DIAGNOSIS — E1165 Type 2 diabetes mellitus with hyperglycemia: Secondary | ICD-10-CM | POA: Diagnosis not present

## 2020-08-23 DIAGNOSIS — D72829 Elevated white blood cell count, unspecified: Secondary | ICD-10-CM | POA: Diagnosis not present

## 2020-08-23 DIAGNOSIS — Z87891 Personal history of nicotine dependence: Secondary | ICD-10-CM | POA: Diagnosis not present

## 2020-08-23 DIAGNOSIS — Z483 Aftercare following surgery for neoplasm: Secondary | ICD-10-CM | POA: Diagnosis not present

## 2020-08-23 DIAGNOSIS — C714 Malignant neoplasm of occipital lobe: Secondary | ICD-10-CM | POA: Diagnosis not present

## 2020-08-23 DIAGNOSIS — G936 Cerebral edema: Secondary | ICD-10-CM | POA: Diagnosis not present

## 2020-08-23 DIAGNOSIS — I2699 Other pulmonary embolism without acute cor pulmonale: Secondary | ICD-10-CM | POA: Diagnosis not present

## 2020-08-23 DIAGNOSIS — Z510 Encounter for antineoplastic radiation therapy: Secondary | ICD-10-CM | POA: Diagnosis not present

## 2020-08-24 ENCOUNTER — Other Ambulatory Visit: Payer: Self-pay

## 2020-08-24 ENCOUNTER — Ambulatory Visit
Admission: RE | Admit: 2020-08-24 | Discharge: 2020-08-24 | Disposition: A | Discharge status: Home / Self Care | Payer: Medicare HMO | Source: Ambulatory Visit | Attending: Radiation Oncology | Admitting: Radiation Oncology

## 2020-08-24 DIAGNOSIS — Z510 Encounter for antineoplastic radiation therapy: Secondary | ICD-10-CM | POA: Diagnosis not present

## 2020-08-24 DIAGNOSIS — D72829 Elevated white blood cell count, unspecified: Secondary | ICD-10-CM | POA: Diagnosis not present

## 2020-08-24 DIAGNOSIS — C7931 Secondary malignant neoplasm of brain: Secondary | ICD-10-CM | POA: Diagnosis not present

## 2020-08-24 DIAGNOSIS — Z87891 Personal history of nicotine dependence: Secondary | ICD-10-CM | POA: Diagnosis not present

## 2020-08-24 DIAGNOSIS — C3411 Malignant neoplasm of upper lobe, right bronchus or lung: Secondary | ICD-10-CM | POA: Diagnosis not present

## 2020-08-24 DIAGNOSIS — E1165 Type 2 diabetes mellitus with hyperglycemia: Secondary | ICD-10-CM | POA: Diagnosis not present

## 2020-08-24 DIAGNOSIS — E1151 Type 2 diabetes mellitus with diabetic peripheral angiopathy without gangrene: Secondary | ICD-10-CM | POA: Diagnosis not present

## 2020-08-24 DIAGNOSIS — Z483 Aftercare following surgery for neoplasm: Secondary | ICD-10-CM | POA: Diagnosis not present

## 2020-08-24 DIAGNOSIS — I2699 Other pulmonary embolism without acute cor pulmonale: Secondary | ICD-10-CM | POA: Diagnosis not present

## 2020-08-24 DIAGNOSIS — C714 Malignant neoplasm of occipital lobe: Secondary | ICD-10-CM | POA: Diagnosis not present

## 2020-08-24 DIAGNOSIS — I619 Nontraumatic intracerebral hemorrhage, unspecified: Secondary | ICD-10-CM | POA: Diagnosis not present

## 2020-08-24 DIAGNOSIS — G936 Cerebral edema: Secondary | ICD-10-CM | POA: Diagnosis not present

## 2020-08-25 ENCOUNTER — Ambulatory Visit
Admission: RE | Admit: 2020-08-25 | Discharge: 2020-08-25 | Disposition: A | Discharge status: Home / Self Care | Payer: Medicare HMO | Source: Ambulatory Visit | Attending: Radiation Oncology | Admitting: Radiation Oncology

## 2020-08-25 ENCOUNTER — Ambulatory Visit (INDEPENDENT_AMBULATORY_CARE_PROVIDER_SITE_OTHER): Payer: Medicare HMO | Admitting: Internal Medicine

## 2020-08-25 DIAGNOSIS — C3490 Malignant neoplasm of unspecified part of unspecified bronchus or lung: Secondary | ICD-10-CM | POA: Diagnosis not present

## 2020-08-25 DIAGNOSIS — Z87891 Personal history of nicotine dependence: Secondary | ICD-10-CM | POA: Diagnosis not present

## 2020-08-25 DIAGNOSIS — C7931 Secondary malignant neoplasm of brain: Secondary | ICD-10-CM | POA: Diagnosis not present

## 2020-08-25 DIAGNOSIS — Z510 Encounter for antineoplastic radiation therapy: Secondary | ICD-10-CM | POA: Diagnosis not present

## 2020-08-25 DIAGNOSIS — C3411 Malignant neoplasm of upper lobe, right bronchus or lung: Secondary | ICD-10-CM | POA: Diagnosis not present

## 2020-08-25 DIAGNOSIS — C714 Malignant neoplasm of occipital lobe: Secondary | ICD-10-CM | POA: Diagnosis not present

## 2020-08-25 NOTE — Progress Notes (Signed)
Thomas Garcia rested with [name] for 30 minutes following his SRS treatment.  Patient denies headache, dizziness, nausea, diplopia or ringing in the ears. Denies fatigue. Patient without complaints. Understands to avoid strenuous activity for the next 24 hours and call [phone] with needs.  Patient was taken to main entrance for transportation home.  BP (!) 154/58   Pulse 89   Temp (!) 96.7 F (35.9 C)   Resp 18   SpO2 99%   [name] M. [place]ie [place], BSN

## 2020-08-26 ENCOUNTER — Other Ambulatory Visit: Payer: Self-pay

## 2020-08-26 ENCOUNTER — Ambulatory Visit
Admission: RE | Admit: 2020-08-26 | Discharge: 2020-08-26 | Disposition: A | Discharge status: Home / Self Care | Payer: Medicare HMO | Source: Ambulatory Visit | Attending: Radiation Oncology | Admitting: Radiation Oncology

## 2020-08-26 DIAGNOSIS — C714 Malignant neoplasm of occipital lobe: Secondary | ICD-10-CM | POA: Diagnosis not present

## 2020-08-26 DIAGNOSIS — C3411 Malignant neoplasm of upper lobe, right bronchus or lung: Secondary | ICD-10-CM | POA: Diagnosis not present

## 2020-08-26 DIAGNOSIS — Z510 Encounter for antineoplastic radiation therapy: Secondary | ICD-10-CM | POA: Diagnosis not present

## 2020-08-26 DIAGNOSIS — Z87891 Personal history of nicotine dependence: Secondary | ICD-10-CM | POA: Diagnosis not present

## 2020-08-29 ENCOUNTER — Ambulatory Visit: Payer: Medicare HMO

## 2020-08-29 ENCOUNTER — Ambulatory Visit
Admission: RE | Admit: 2020-08-29 | Discharge: 2020-08-29 | Disposition: A | Discharge status: Home / Self Care | Payer: Medicare HMO | Source: Ambulatory Visit | Attending: Radiation Oncology | Admitting: Radiation Oncology

## 2020-08-29 ENCOUNTER — Telehealth (INDEPENDENT_AMBULATORY_CARE_PROVIDER_SITE_OTHER): Payer: Self-pay

## 2020-08-29 ENCOUNTER — Other Ambulatory Visit: Payer: Self-pay

## 2020-08-29 VITALS — BP 127/54 | HR 97 | Temp 97.6°F | Resp 20

## 2020-08-29 DIAGNOSIS — C7931 Secondary malignant neoplasm of brain: Secondary | ICD-10-CM

## 2020-08-29 DIAGNOSIS — C3411 Malignant neoplasm of upper lobe, right bronchus or lung: Secondary | ICD-10-CM | POA: Diagnosis not present

## 2020-08-29 DIAGNOSIS — C714 Malignant neoplasm of occipital lobe: Secondary | ICD-10-CM | POA: Diagnosis not present

## 2020-08-29 DIAGNOSIS — Z87891 Personal history of nicotine dependence: Secondary | ICD-10-CM | POA: Diagnosis not present

## 2020-08-29 DIAGNOSIS — Z510 Encounter for antineoplastic radiation therapy: Secondary | ICD-10-CM | POA: Diagnosis not present

## 2020-08-29 NOTE — Progress Notes (Signed)
Thomas Garcia rested with [name] for 15 minutes following his SRS treatment.  Patient denies headache, dizziness, nausea, diplopia or ringing in the ears. Denies fatigue. Patient without complaints. Understands to avoid strenuous activity for the next 24 hours and call [phone] with needs.  Patient was taken to main entrance for transportation home.  BP (!) 127/54 (BP Location: Right Arm, Patient Position: Sitting, Cuff Size: Normal)   Pulse 97   Temp 97.6 F (36.4 C)   Resp 20   SpO2 100%   Thomas Garcia M. Leonie Green, BSN

## 2020-08-29 NOTE — Telephone Encounter (Signed)
HH, OP, PT OT. Pt has been receving chemo, radiation treatment, he is not tolerating the therapy well now. Would like to place it on HOLD until 1 wk. To see if he is able to star again soon.

## 2020-08-30 ENCOUNTER — Ambulatory Visit: Payer: Medicare HMO

## 2020-08-30 DIAGNOSIS — I2699 Other pulmonary embolism without acute cor pulmonale: Secondary | ICD-10-CM | POA: Diagnosis not present

## 2020-08-30 DIAGNOSIS — Z483 Aftercare following surgery for neoplasm: Secondary | ICD-10-CM | POA: Diagnosis not present

## 2020-08-30 DIAGNOSIS — D72829 Elevated white blood cell count, unspecified: Secondary | ICD-10-CM | POA: Diagnosis not present

## 2020-08-30 DIAGNOSIS — G936 Cerebral edema: Secondary | ICD-10-CM | POA: Diagnosis not present

## 2020-08-30 DIAGNOSIS — C3411 Malignant neoplasm of upper lobe, right bronchus or lung: Secondary | ICD-10-CM | POA: Diagnosis not present

## 2020-08-30 DIAGNOSIS — E1165 Type 2 diabetes mellitus with hyperglycemia: Secondary | ICD-10-CM | POA: Diagnosis not present

## 2020-08-30 DIAGNOSIS — C7931 Secondary malignant neoplasm of brain: Secondary | ICD-10-CM | POA: Diagnosis not present

## 2020-08-30 DIAGNOSIS — E1151 Type 2 diabetes mellitus with diabetic peripheral angiopathy without gangrene: Secondary | ICD-10-CM | POA: Diagnosis not present

## 2020-08-30 DIAGNOSIS — I619 Nontraumatic intracerebral hemorrhage, unspecified: Secondary | ICD-10-CM | POA: Diagnosis not present

## 2020-08-31 ENCOUNTER — Ambulatory Visit
Admission: RE | Admit: 2020-08-31 | Discharge: 2020-08-31 | Disposition: A | Discharge status: Home / Self Care | Payer: Medicare HMO | Source: Ambulatory Visit | Attending: Radiation Oncology | Admitting: Radiation Oncology

## 2020-08-31 ENCOUNTER — Encounter: Payer: Self-pay | Admitting: Radiation Oncology

## 2020-08-31 DIAGNOSIS — Z510 Encounter for antineoplastic radiation therapy: Secondary | ICD-10-CM | POA: Diagnosis not present

## 2020-08-31 DIAGNOSIS — Z87891 Personal history of nicotine dependence: Secondary | ICD-10-CM | POA: Diagnosis not present

## 2020-08-31 DIAGNOSIS — C714 Malignant neoplasm of occipital lobe: Secondary | ICD-10-CM | POA: Diagnosis not present

## 2020-08-31 DIAGNOSIS — C3411 Malignant neoplasm of upper lobe, right bronchus or lung: Secondary | ICD-10-CM | POA: Diagnosis not present

## 2020-08-31 NOTE — Progress Notes (Signed)
Thomas Garcia rested with Thomas Garcia for 15 minutes following his SRS treatment.  Patient denies headache, dizziness, nausea, diplopia or ringing in the ears. Denies fatigue. Patient without complaints. Understands to avoid strenuous activity for the next 24 hours and call [phone] with needs.  Patient was taken to main entrance for transportation home.  BP (!) 126/48   Pulse 94   Temp (!) 96.9 F (36.1 C)   Resp 18   SpO2 97%   Thomas Garcia Thomas Garcia, BSN

## 2020-09-01 ENCOUNTER — Other Ambulatory Visit: Payer: Self-pay

## 2020-09-01 ENCOUNTER — Ambulatory Visit
Admission: RE | Admit: 2020-09-01 | Discharge: 2020-09-01 | Disposition: A | Discharge status: Home / Self Care | Payer: Medicare HMO | Source: Ambulatory Visit | Attending: Radiation Oncology | Admitting: Radiation Oncology

## 2020-09-01 DIAGNOSIS — I619 Nontraumatic intracerebral hemorrhage, unspecified: Secondary | ICD-10-CM | POA: Diagnosis not present

## 2020-09-01 DIAGNOSIS — E1165 Type 2 diabetes mellitus with hyperglycemia: Secondary | ICD-10-CM | POA: Diagnosis not present

## 2020-09-01 DIAGNOSIS — I2699 Other pulmonary embolism without acute cor pulmonale: Secondary | ICD-10-CM | POA: Diagnosis not present

## 2020-09-01 DIAGNOSIS — G936 Cerebral edema: Secondary | ICD-10-CM | POA: Diagnosis not present

## 2020-09-01 DIAGNOSIS — Z87891 Personal history of nicotine dependence: Secondary | ICD-10-CM | POA: Diagnosis not present

## 2020-09-01 DIAGNOSIS — C7931 Secondary malignant neoplasm of brain: Secondary | ICD-10-CM | POA: Diagnosis not present

## 2020-09-01 DIAGNOSIS — E1151 Type 2 diabetes mellitus with diabetic peripheral angiopathy without gangrene: Secondary | ICD-10-CM | POA: Diagnosis not present

## 2020-09-01 DIAGNOSIS — Z483 Aftercare following surgery for neoplasm: Secondary | ICD-10-CM | POA: Diagnosis not present

## 2020-09-01 DIAGNOSIS — C714 Malignant neoplasm of occipital lobe: Secondary | ICD-10-CM | POA: Diagnosis not present

## 2020-09-01 DIAGNOSIS — C3411 Malignant neoplasm of upper lobe, right bronchus or lung: Secondary | ICD-10-CM | POA: Diagnosis not present

## 2020-09-01 DIAGNOSIS — D72829 Elevated white blood cell count, unspecified: Secondary | ICD-10-CM | POA: Diagnosis not present

## 2020-09-01 DIAGNOSIS — Z510 Encounter for antineoplastic radiation therapy: Secondary | ICD-10-CM | POA: Diagnosis not present

## 2020-09-02 ENCOUNTER — Ambulatory Visit
Admission: RE | Admit: 2020-09-02 | Discharge: 2020-09-02 | Disposition: A | Discharge status: Home / Self Care | Payer: Medicare HMO | Source: Ambulatory Visit | Attending: Radiation Oncology | Admitting: Radiation Oncology

## 2020-09-02 ENCOUNTER — Other Ambulatory Visit: Payer: Self-pay

## 2020-09-02 DIAGNOSIS — C3411 Malignant neoplasm of upper lobe, right bronchus or lung: Secondary | ICD-10-CM | POA: Diagnosis not present

## 2020-09-02 DIAGNOSIS — Z510 Encounter for antineoplastic radiation therapy: Secondary | ICD-10-CM | POA: Diagnosis not present

## 2020-09-02 DIAGNOSIS — Z87891 Personal history of nicotine dependence: Secondary | ICD-10-CM | POA: Diagnosis not present

## 2020-09-02 DIAGNOSIS — C714 Malignant neoplasm of occipital lobe: Secondary | ICD-10-CM | POA: Diagnosis not present

## 2020-09-06 ENCOUNTER — Ambulatory Visit: Payer: Medicare HMO

## 2020-09-07 ENCOUNTER — Other Ambulatory Visit: Payer: Self-pay

## 2020-09-07 ENCOUNTER — Ambulatory Visit
Admission: RE | Admit: 2020-09-07 | Discharge: 2020-09-07 | Disposition: A | Discharge status: Home / Self Care | Payer: Medicare HMO | Source: Ambulatory Visit | Attending: Radiation Oncology | Admitting: Radiation Oncology

## 2020-09-07 DIAGNOSIS — C714 Malignant neoplasm of occipital lobe: Secondary | ICD-10-CM | POA: Insufficient documentation

## 2020-09-07 DIAGNOSIS — C3411 Malignant neoplasm of upper lobe, right bronchus or lung: Secondary | ICD-10-CM | POA: Insufficient documentation

## 2020-09-07 DIAGNOSIS — Z510 Encounter for antineoplastic radiation therapy: Secondary | ICD-10-CM | POA: Diagnosis not present

## 2020-09-07 DIAGNOSIS — Z87891 Personal history of nicotine dependence: Secondary | ICD-10-CM | POA: Diagnosis not present

## 2020-09-07 NOTE — Op Note (Signed)
Name: Thomas Garcia    MRN: [id number]   Date: 08/25/2020    DOB: [birth year]   STEREOTACTIC RADIOSURGERY OPERATIVE NOTE  PRE-OPERATIVE DIAGNOSIS:  Metastatic lung cancer, occipital lobe, right; pulmonary adenocarcinoma  POST-OPERATIVE DIAGNOSIS:  Same  PROCEDURE:  Stereotactic Radiosurgery  SURGEON:  Elwin Sleight, DO  RADIATION ONCOLOGIST:  Kyung Rudd, MD  TECHNIQUE:  The patient underwent a radiation treatment planning session in the radiation oncology simulation suite under the care of the radiation oncology physician and physicist.  I participated closely in the radiation treatment planning afterwards. The patient underwent planning CT which was fused to 3T high resolution MRI with 1 mm axial slices.  These images were fused on the planning system.  We contoured the gross target volumes and subsequently expanded this to yield the Planning Target Volume. I actively participated in the planning process.  I helped to define and review the target contours and also the contours of the optic pathway, eyes, brainstem and selected nearby organs at risk.  All the dose constraints for critical structures were reviewed and compared to AAPM Task Group 101.  The prescription dose conformity was reviewed.  I approved the plan electronically.    Accordingly, Thomas Garcia  was brought to the TrueBeam stereotactic radiation treatment linac and placed in the custom immobilization mask.  The patient was aligned according to the IR fiducial markers with BrainLab Exactrac, then orthogonal x-rays were used in ExacTrac with the 6DOF robotic table and the shifts were made to align the patient.  Thomas Garcia received stereotactic radiosurgery to a prescription dose of  9Gy uneventfully.   He will ultimately receive 27 Gy in 3 fractions.  The detailed description of the procedure is recorded in the radiation oncology procedure note.  I was present for the duration of the procedure.  DISPOSITION:   Following  delivery, the patient was transported to nursing in stable condition and monitored for possible acute effects to be discharged to home in stable condition with follow-up in one month.  Elwin Sleight, [facility]

## 2020-09-08 ENCOUNTER — Ambulatory Visit
Admission: RE | Admit: 2020-09-08 | Discharge: 2020-09-08 | Disposition: A | Discharge status: Home / Self Care | Payer: Medicare HMO | Source: Ambulatory Visit | Attending: Radiation Oncology | Admitting: Radiation Oncology

## 2020-09-08 DIAGNOSIS — C3411 Malignant neoplasm of upper lobe, right bronchus or lung: Secondary | ICD-10-CM | POA: Diagnosis not present

## 2020-09-08 DIAGNOSIS — Z87891 Personal history of nicotine dependence: Secondary | ICD-10-CM | POA: Diagnosis not present

## 2020-09-08 DIAGNOSIS — Z510 Encounter for antineoplastic radiation therapy: Secondary | ICD-10-CM | POA: Diagnosis not present

## 2020-09-08 DIAGNOSIS — C714 Malignant neoplasm of occipital lobe: Secondary | ICD-10-CM | POA: Diagnosis not present

## 2020-09-09 ENCOUNTER — Ambulatory Visit
Admission: RE | Admit: 2020-09-09 | Discharge: 2020-09-09 | Disposition: A | Discharge status: Home / Self Care | Payer: Medicare HMO | Source: Ambulatory Visit | Attending: Radiation Oncology | Admitting: Radiation Oncology

## 2020-09-09 ENCOUNTER — Other Ambulatory Visit: Payer: Self-pay

## 2020-09-09 DIAGNOSIS — Z87891 Personal history of nicotine dependence: Secondary | ICD-10-CM | POA: Diagnosis not present

## 2020-09-09 DIAGNOSIS — C3411 Malignant neoplasm of upper lobe, right bronchus or lung: Secondary | ICD-10-CM | POA: Diagnosis not present

## 2020-09-09 DIAGNOSIS — C714 Malignant neoplasm of occipital lobe: Secondary | ICD-10-CM | POA: Diagnosis not present

## 2020-09-09 DIAGNOSIS — Z510 Encounter for antineoplastic radiation therapy: Secondary | ICD-10-CM | POA: Diagnosis not present

## 2020-09-12 ENCOUNTER — Ambulatory Visit: Payer: Medicare HMO

## 2020-09-12 ENCOUNTER — Ambulatory Visit
Admission: RE | Admit: 2020-09-12 | Discharge: 2020-09-12 | Disposition: A | Discharge status: Home / Self Care | Payer: Medicare HMO | Source: Ambulatory Visit | Attending: Radiation Oncology | Admitting: Radiation Oncology

## 2020-09-12 ENCOUNTER — Other Ambulatory Visit: Payer: Self-pay

## 2020-09-12 DIAGNOSIS — Z87891 Personal history of nicotine dependence: Secondary | ICD-10-CM | POA: Diagnosis not present

## 2020-09-12 DIAGNOSIS — C3411 Malignant neoplasm of upper lobe, right bronchus or lung: Secondary | ICD-10-CM | POA: Diagnosis not present

## 2020-09-12 DIAGNOSIS — C714 Malignant neoplasm of occipital lobe: Secondary | ICD-10-CM | POA: Diagnosis not present

## 2020-09-12 DIAGNOSIS — Z510 Encounter for antineoplastic radiation therapy: Secondary | ICD-10-CM | POA: Diagnosis not present

## 2020-09-12 NOTE — Progress Notes (Signed)
  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    Indication for treatment:  palliative       Radiation treatment dates:  08/25/20-08/31/20  Site/dose:    SRS Treatments:  These sites were treated to 27 Gy in 3 fractions PTV_1 Rt Occ 78mm   PTV_3 Rt Cereb 34mm This site was treated to 20 Gy in 1 fraction PTV_2LtCereb57mm  Narrative: The patient tolerated radiation treatment well.   There were no signs of acute toxicity after treatment. He is also receiving treatment to the right lung which will complete in the near future.  Plan: The patient will receive a call in about one month from the radiation oncology department and will follow in the brain and spine oncology program with MRIs ever 3 months for the first year following treatment. He will continue follow up with Dr. Delton Coombes as well.      Carola Rhine, PAC

## 2020-09-13 ENCOUNTER — Ambulatory Visit
Admission: RE | Admit: 2020-09-13 | Discharge: 2020-09-13 | Disposition: A | Discharge status: Home / Self Care | Payer: Medicare HMO | Source: Ambulatory Visit | Attending: Radiation Oncology | Admitting: Radiation Oncology

## 2020-09-13 ENCOUNTER — Ambulatory Visit: Payer: Medicare HMO

## 2020-09-13 ENCOUNTER — Telehealth (INDEPENDENT_AMBULATORY_CARE_PROVIDER_SITE_OTHER): Payer: Self-pay

## 2020-09-13 DIAGNOSIS — D72829 Elevated white blood cell count, unspecified: Secondary | ICD-10-CM | POA: Diagnosis not present

## 2020-09-13 DIAGNOSIS — C3411 Malignant neoplasm of upper lobe, right bronchus or lung: Secondary | ICD-10-CM | POA: Diagnosis not present

## 2020-09-13 DIAGNOSIS — C714 Malignant neoplasm of occipital lobe: Secondary | ICD-10-CM | POA: Diagnosis not present

## 2020-09-13 DIAGNOSIS — Z483 Aftercare following surgery for neoplasm: Secondary | ICD-10-CM | POA: Diagnosis not present

## 2020-09-13 DIAGNOSIS — G936 Cerebral edema: Secondary | ICD-10-CM | POA: Diagnosis not present

## 2020-09-13 DIAGNOSIS — Z87891 Personal history of nicotine dependence: Secondary | ICD-10-CM | POA: Diagnosis not present

## 2020-09-13 DIAGNOSIS — I619 Nontraumatic intracerebral hemorrhage, unspecified: Secondary | ICD-10-CM | POA: Diagnosis not present

## 2020-09-13 DIAGNOSIS — I2699 Other pulmonary embolism without acute cor pulmonale: Secondary | ICD-10-CM | POA: Diagnosis not present

## 2020-09-13 DIAGNOSIS — E1165 Type 2 diabetes mellitus with hyperglycemia: Secondary | ICD-10-CM | POA: Diagnosis not present

## 2020-09-13 DIAGNOSIS — C7931 Secondary malignant neoplasm of brain: Secondary | ICD-10-CM | POA: Diagnosis not present

## 2020-09-13 DIAGNOSIS — Z510 Encounter for antineoplastic radiation therapy: Secondary | ICD-10-CM | POA: Diagnosis not present

## 2020-09-13 DIAGNOSIS — E1151 Type 2 diabetes mellitus with diabetic peripheral angiopathy without gangrene: Secondary | ICD-10-CM | POA: Diagnosis not present

## 2020-09-13 NOTE — Telephone Encounter (Signed)
Tressia Danas a physical therapist with [name] called and is requesting to see patient once weekly for 2 more weeks to reassess and provide assistance with transfers. Patient was on hold for PT due to radiation treatment and may go on chemo after his radiation.   Suezanne Jacquet also stated that patient is having Right Lower Extremity pain and is taking Tylenol and also is having difficulty sleeping.   I see patient did not show for his last appointment and I will try to reach out to him again.  Please advise if OK to proceed with PT.

## 2020-09-13 NOTE — Telephone Encounter (Signed)
Called Tressia Danas at [phone] and left a detailed voice message to let him know OK to proceed with PT per Dr. Anastasio Champion and also let him know that we have Not seen this patient since he was in the hospital and he was a no show and I will definitely reach out to patient again to schedule him to be seen in our office.

## 2020-09-13 NOTE — Telephone Encounter (Signed)
Yes, okay to proceed with physical therapy.

## 2020-09-14 ENCOUNTER — Other Ambulatory Visit: Payer: Self-pay

## 2020-09-14 ENCOUNTER — Ambulatory Visit: Payer: Medicare HMO

## 2020-09-14 ENCOUNTER — Ambulatory Visit
Admission: RE | Admit: 2020-09-14 | Discharge: 2020-09-14 | Disposition: A | Discharge status: Home / Self Care | Payer: Medicare HMO | Source: Ambulatory Visit | Attending: Radiation Oncology | Admitting: Radiation Oncology

## 2020-09-14 ENCOUNTER — Telehealth (INDEPENDENT_AMBULATORY_CARE_PROVIDER_SITE_OTHER): Payer: Self-pay

## 2020-09-14 DIAGNOSIS — I619 Nontraumatic intracerebral hemorrhage, unspecified: Secondary | ICD-10-CM | POA: Diagnosis not present

## 2020-09-14 DIAGNOSIS — D72829 Elevated white blood cell count, unspecified: Secondary | ICD-10-CM | POA: Diagnosis not present

## 2020-09-14 DIAGNOSIS — C3411 Malignant neoplasm of upper lobe, right bronchus or lung: Secondary | ICD-10-CM | POA: Diagnosis not present

## 2020-09-14 DIAGNOSIS — C7931 Secondary malignant neoplasm of brain: Secondary | ICD-10-CM | POA: Diagnosis not present

## 2020-09-14 DIAGNOSIS — Z87891 Personal history of nicotine dependence: Secondary | ICD-10-CM | POA: Diagnosis not present

## 2020-09-14 DIAGNOSIS — Z483 Aftercare following surgery for neoplasm: Secondary | ICD-10-CM | POA: Diagnosis not present

## 2020-09-14 DIAGNOSIS — E1165 Type 2 diabetes mellitus with hyperglycemia: Secondary | ICD-10-CM | POA: Diagnosis not present

## 2020-09-14 DIAGNOSIS — C714 Malignant neoplasm of occipital lobe: Secondary | ICD-10-CM | POA: Diagnosis not present

## 2020-09-14 DIAGNOSIS — E1151 Type 2 diabetes mellitus with diabetic peripheral angiopathy without gangrene: Secondary | ICD-10-CM | POA: Diagnosis not present

## 2020-09-14 DIAGNOSIS — G936 Cerebral edema: Secondary | ICD-10-CM | POA: Diagnosis not present

## 2020-09-14 DIAGNOSIS — I2699 Other pulmonary embolism without acute cor pulmonale: Secondary | ICD-10-CM | POA: Diagnosis not present

## 2020-09-14 DIAGNOSIS — Z510 Encounter for antineoplastic radiation therapy: Secondary | ICD-10-CM | POA: Diagnosis not present

## 2020-09-14 NOTE — Telephone Encounter (Signed)
Verita Lamb at [phone] and gave her the verbal OK per Dr. Anastasio Champion. Sharyn Lull verbalized an understanding.

## 2020-09-14 NOTE — Telephone Encounter (Signed)
Sharyn Lull with Occupational Therapy with [name] called and left a detailed voice message for verbal approval to extend OT:   1 time a week for 1 week 2 times a week for 1 week And re-certify for certification for more OT  Please advise if OK to approve.

## 2020-09-14 NOTE — Telephone Encounter (Signed)
Yes, please give approval verbally.

## 2020-09-15 ENCOUNTER — Ambulatory Visit: Payer: Medicare HMO

## 2020-09-16 ENCOUNTER — Ambulatory Visit
Admission: RE | Admit: 2020-09-16 | Discharge: 2020-09-16 | Disposition: A | Discharge status: Home / Self Care | Payer: Medicare HMO | Source: Ambulatory Visit | Attending: Radiation Oncology | Admitting: Radiation Oncology

## 2020-09-16 ENCOUNTER — Other Ambulatory Visit: Payer: Self-pay

## 2020-09-16 ENCOUNTER — Encounter: Payer: Self-pay | Admitting: Radiation Oncology

## 2020-09-16 DIAGNOSIS — D72829 Elevated white blood cell count, unspecified: Secondary | ICD-10-CM | POA: Diagnosis not present

## 2020-09-16 DIAGNOSIS — C3411 Malignant neoplasm of upper lobe, right bronchus or lung: Secondary | ICD-10-CM | POA: Diagnosis not present

## 2020-09-16 DIAGNOSIS — G936 Cerebral edema: Secondary | ICD-10-CM | POA: Diagnosis not present

## 2020-09-16 DIAGNOSIS — Z483 Aftercare following surgery for neoplasm: Secondary | ICD-10-CM | POA: Diagnosis not present

## 2020-09-16 DIAGNOSIS — Z510 Encounter for antineoplastic radiation therapy: Secondary | ICD-10-CM | POA: Diagnosis not present

## 2020-09-16 DIAGNOSIS — C7931 Secondary malignant neoplasm of brain: Secondary | ICD-10-CM | POA: Diagnosis not present

## 2020-09-16 DIAGNOSIS — C714 Malignant neoplasm of occipital lobe: Secondary | ICD-10-CM | POA: Diagnosis not present

## 2020-09-16 DIAGNOSIS — I619 Nontraumatic intracerebral hemorrhage, unspecified: Secondary | ICD-10-CM | POA: Diagnosis not present

## 2020-09-16 DIAGNOSIS — E1151 Type 2 diabetes mellitus with diabetic peripheral angiopathy without gangrene: Secondary | ICD-10-CM | POA: Diagnosis not present

## 2020-09-16 DIAGNOSIS — Z87891 Personal history of nicotine dependence: Secondary | ICD-10-CM | POA: Diagnosis not present

## 2020-09-16 DIAGNOSIS — E1165 Type 2 diabetes mellitus with hyperglycemia: Secondary | ICD-10-CM | POA: Diagnosis not present

## 2020-09-16 DIAGNOSIS — I2699 Other pulmonary embolism without acute cor pulmonale: Secondary | ICD-10-CM | POA: Diagnosis not present

## 2020-09-19 NOTE — Progress Notes (Signed)
                                                                                                                                                             Patient Name: Thomas Garcia MRN: [id number] DOB: [birth year] Referring Physician: Hurshel Party (Profile Not Attached) Date of Service: 09/16/2020 [facility], [place]                                                        End Of Treatment Note  Diagnoses: C34.11-Malignant neoplasm of upper lobe, right bronchus or lung C71.4-Malignant neoplasm of occipital lobe C79.31-Secondary malignant neoplasm of brain  Cancer Staging: Stage IV, NSCLC, 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    Intent: Palliative  Radiation Treatment Dates: 08/23/2020 through 09/16/2020  Lung, Right: Lung_Rt 3D 37.5/37.5 2.5 15/15 6X, 10X   Narrative: The patient tolerated radiation therapy relatively well. As noted in other documents he was also receiving radiotherapy to the brain.  Plan: The patient will receive a call in about one month from the radiation oncology department. He will continue follow up with Dr. Julien Nordmann as well.   ________________________________________________    Carola Rhine, [facility]

## 2020-09-20 ENCOUNTER — Ambulatory Visit (INDEPENDENT_AMBULATORY_CARE_PROVIDER_SITE_OTHER): Payer: Medicare HMO | Admitting: Internal Medicine

## 2020-09-21 ENCOUNTER — Other Ambulatory Visit: Payer: Self-pay

## 2020-09-21 ENCOUNTER — Ambulatory Visit (INDEPENDENT_AMBULATORY_CARE_PROVIDER_SITE_OTHER): Payer: Medicare HMO | Admitting: Internal Medicine

## 2020-09-21 ENCOUNTER — Encounter (INDEPENDENT_AMBULATORY_CARE_PROVIDER_SITE_OTHER): Payer: Self-pay | Admitting: Internal Medicine

## 2020-09-21 VITALS — BP 110/73 | HR 76 | Temp 98.0°F | Resp 17 | Ht 69.0 in | Wt 170.0 lb

## 2020-09-21 DIAGNOSIS — G936 Cerebral edema: Secondary | ICD-10-CM | POA: Diagnosis not present

## 2020-09-21 DIAGNOSIS — I739 Peripheral vascular disease, unspecified: Secondary | ICD-10-CM

## 2020-09-21 DIAGNOSIS — I10 Essential (primary) hypertension: Secondary | ICD-10-CM | POA: Diagnosis not present

## 2020-09-21 DIAGNOSIS — E1151 Type 2 diabetes mellitus with diabetic peripheral angiopathy without gangrene: Secondary | ICD-10-CM | POA: Diagnosis not present

## 2020-09-21 DIAGNOSIS — D72829 Elevated white blood cell count, unspecified: Secondary | ICD-10-CM | POA: Diagnosis not present

## 2020-09-21 DIAGNOSIS — I2699 Other pulmonary embolism without acute cor pulmonale: Secondary | ICD-10-CM | POA: Diagnosis not present

## 2020-09-21 DIAGNOSIS — C7931 Secondary malignant neoplasm of brain: Secondary | ICD-10-CM | POA: Diagnosis not present

## 2020-09-21 DIAGNOSIS — E119 Type 2 diabetes mellitus without complications: Secondary | ICD-10-CM

## 2020-09-21 DIAGNOSIS — I619 Nontraumatic intracerebral hemorrhage, unspecified: Secondary | ICD-10-CM | POA: Diagnosis not present

## 2020-09-21 DIAGNOSIS — C3411 Malignant neoplasm of upper lobe, right bronchus or lung: Secondary | ICD-10-CM | POA: Diagnosis not present

## 2020-09-21 DIAGNOSIS — Z483 Aftercare following surgery for neoplasm: Secondary | ICD-10-CM | POA: Diagnosis not present

## 2020-09-21 DIAGNOSIS — E1165 Type 2 diabetes mellitus with hyperglycemia: Secondary | ICD-10-CM | POA: Diagnosis not present

## 2020-09-21 MED ORDER — LOPERAMIDE HCL 2 MG PO CAPS: 2.0000 mg | ORAL_CAPSULE | ORAL | 3 refills | Status: DC | PRN

## 2020-09-21 MED ORDER — OXYCODONE HCL 5 MG PO CAPS: 5.0000 mg | ORAL_CAPSULE | Freq: Two times a day (BID) | ORAL | 0 refills | Status: DC | PRN

## 2020-09-21 NOTE — Progress Notes (Signed)
Brother is here to help him. Son was the POC, but he just stop commucation. Stop helping me. Need a referral for foot doctor here area.  Need refill for medicines for diarrhea again it is out of control. Has [facility] for wound care. But will need orders for home care CNA, SW for to her needs to assist brother to make sure he is cared for. Too much for brother to do  & his condition /health.   Will need something for the pain in foot at night. Having a very hard time . But he did not know who to tell.

## 2020-09-21 NOTE — Progress Notes (Signed)
Metrics: Intervention Frequency ACO  Documented Smoking Status Yearly  Screened one or more times in 24 months  Cessation Counseling or  Active cessation medication Past 24 months  Past 24 months   Guideline developer: UpToDate (See UpToDate for funding source) Date Released: 2014       Wellness Office Visit  Subjective:  Patient ID: Thomas Garcia, male    DOB: 1940-09-06  Age: 80 y.o. MRN: [id number]  CC: This unfortunate man comes in for follow-up.  He has been diagnosed with metastatic lung cancer to the brain. 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 The patient is diabetic and takes metformin.  His last hemoglobin A1c just under 3 months ago was 6.4%.  The patient has a poor appetite and appears to have lost weight. 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 His main problem was significant right leg pain. Past Medical History:  Diagnosis Date   Diabetes mellitus without complication ([place])    Kidney stones    Lung cancer ([place]) 2022   PONV (postoperative nausea and vomiting)    Past Surgical History:  Procedure Laterality Date   ABDOMINAL AORTOGRAM W/LOWER EXTREMITY N/A 09/17/2018   Procedure: ABDOMINAL AORTOGRAM W/LOWER EXTREMITY;  Surgeon: Marty Heck, MD;  Location: [facility];  Service: Cardiovascular;  Laterality: N/A;   AMPUTATION Left 09/22/2018   Procedure: AMPUTATION BELOW KNEE LEFT;  Surgeon: Marty Heck, MD;  Location: [place];  Service: Vascular;  Laterality: Left;   AMPUTATION TOE Left 09/10/2018   Procedure: AMPUTATION OF THIRD TOE LEFT FOOT, DEBRIDEMENT OF ULCERS ON SECOND TOE AND PARTIAL AMPUTATION SECOND TOE ON LEFT FOOT;  Surgeon: Virl Cagey, MD;  Location: [facility];  Service: General;  Laterality: Left;   APPLICATION OF CRANIAL NAVIGATION N/A  07/08/2020   Procedure: APPLICATION OF CRANIAL NAVIGATION;  Surgeon: Karsten Ro, DO;  Location: [place];  Service: Neurosurgery;  Laterality: N/A;   APPLICATION OF WOUND VAC Left 09/18/2018   Procedure: APPLICATION OF WOUND VAC;  Surgeon: Marty Heck, MD;  Location: [place];  Service: Vascular;  Laterality: Left;   CRANIOTOMY Right 07/08/2020   Procedure: Right Occipital craniotomy for tumor resection with brainlab;  Surgeon: Dawley, Theodoro Doing, DO;  Location: [place];  Service: Neurosurgery;  Laterality: Right;   CYSTOSCOPY  05/08/2012   Procedure: CYSTOSCOPY;  Surgeon: Marissa Nestle, MD;  Location: [facility];  Service: Urology;  Laterality: N/A;  Evacuation of clots   IR IVC FILTER PLMT / S&I /IMG GUID/MOD SED  07/05/2020   KIDNEY SURGERY     sutures    PERIPHERAL VASCULAR BALLOON ANGIOPLASTY  09/17/2018   Procedure: PERIPHERAL VASCULAR BALLOON ANGIOPLASTY;  Surgeon: Marty Heck, MD;  Location: [facility];  Service: Cardiovascular;;  LT SFA and AT   RADIOLOGY WITH ANESTHESIA N/A 06/30/2020   Procedure: MRI WITH ANESTHESIA   BRAIN WITH AND WITHOUT CONTRAST;  Surgeon: Radiologist, Medication, MD;  Location: [place];  Service: Radiology;  Laterality: N/A;   SPLENECTOMY     TRANSMETATARSAL AMPUTATION Left 09/18/2018   Procedure: TRANSMETATARSAL AMPUTATION LEFT FOOT;  Surgeon: Marty Heck, MD;  Location: [place];  Service: Vascular;  Laterality: Left;   TRANSURETHRAL RESECTION OF PROSTATE  05/14/2012   Procedure: TRANSURETHRAL RESECTION OF THE PROSTATE (  TURP);  Surgeon: Marissa Nestle, MD;  [facility];  Service: Urology;  Laterality: N/A;     Family History  Problem Relation Age of Onset   Diabetes Mother     Social History   Social History Narrative   Divorced since 2011.Lives with brother.Retired,previously maintenance work for [employer]   Social History   Tobacco Use   Smoking status: Former    Packs/day: 0.50    Years: 45.00    Pack years: 22.50     Types: Cigarettes   Smokeless tobacco: Never   Tobacco comments:    4 cigarettes per day  Substance Use Topics   Alcohol use: No    Current Meds  Medication Sig   acetaminophen (TYLENOL) 325 MG tablet Take 2 tablets (650 mg total) by mouth every 4 (four) hours as needed for headache or mild pain. (Patient taking differently: Take 650 mg by mouth daily.)   atorvastatin (LIPITOR) 10 MG tablet Take 1 tablet (10 mg total) by mouth daily at 6 PM.   Cal Carb-Mag Hydrox-Simeth (ROLAIDS ADVANCED) 1000-200-40 MG CHEW Chew 1 tablet by mouth daily as needed (for indigestion).   clopidogrel (PLAVIX) 75 MG tablet Take 1 tablet (75 mg total) by mouth daily.   ferrous sulfate 324 MG TBEC Take 324 mg by mouth daily with breakfast.   levETIRAcetam (KEPPRA) 500 MG tablet Take 1 tablet (500 mg total) by mouth 2 (two) times daily for 3 days.   lisinopril (ZESTRIL) 5 MG tablet Take 1 tablet (5 mg total) by mouth daily.   metFORMIN (GLUCOPHAGE) 500 MG tablet Take 1 tablet (500 mg total) by mouth 2 (two) times daily with a meal.   nicotine (NICODERM CQ) 14 mg/24hr patch Place 1 patch (14 mg total) onto the skin daily.   oxycodone (OXY-IR) 5 MG capsule Take 1 capsule (5 mg total) by mouth 2 (two) times daily as needed.   pantoprazole (PROTONIX) 40 MG tablet Take 1 tablet (40 mg total) by mouth daily at 6 (six) AM.   saccharomyces boulardii (FLORASTOR) 250 MG capsule Take 1 capsule (250 mg total) by mouth 2 (two) times daily.   traZODone (DESYREL) 50 MG tablet Take 1 tablet (50 mg total) by mouth at bedtime.   [DISCONTINUED] loperamide (IMODIUM) 2 MG capsule Take 1 capsule (2 mg total) by mouth as needed for diarrhea or loose stools.       Objective:   Today's Vitals: BP 110/73 (BP Location: Left Arm, Patient Position: Sitting, Cuff Size: Small)   Pulse 76   Temp 98 F (36.7 C) (Temporal)   Resp 17   Ht 5\' 9"  (1.753 m)   Wt 170 lb (77.1 kg)   SpO2 97%   BMI 25.10 kg/m  Vitals with BMI 09/21/2020  08/31/2020 08/29/2020  Height 5\' 9"  - -  Weight 170 lbs - -  BMI 06.30 - -  Systolic 160 109 323  Diastolic 73 48 54  Pulse 76 94 97     Physical Exam Patient is in a wheelchair.  He looks Pensions consultant.  He has left below-knee amputation.  The right lower leg is bandaged up.      Assessment   1. Diabetes mellitus type 2 in nonobese (HCC)   2. Essential hypertension   3. PVD (peripheral vascular disease) ([place])       Tests ordered Orders Placed This Encounter  Procedures   CBC   COMPLETE METABOLIC PANEL WITH GFR   Hemoglobin A1c   Ambulatory referral  to Vascular Surgery      Plan: 1.  We will check an A1c and to see if he really needs to take metformin at this stage. 2.  His right leg is extremely painful due to peripheral vascular disease and in light of the fact that he has metastatic malignancy, I am going to prescribe pain opioid medications with oxycodone.  I will refer him to vascular surgery to see if he is candidate for further operative procedure. 3.  I will see him in a couple of months time for follow-up and further recommendations will depend on blood results.   Meds ordered this encounter  Medications   loperamide (IMODIUM) 2 MG capsule    Sig: Take 1 capsule (2 mg total) by mouth as needed for diarrhea or loose stools.    Dispense:  30 capsule    Refill:  3   oxycodone (OXY-IR) 5 MG capsule    Sig: Take 1 capsule (5 mg total) by mouth 2 (two) times daily as needed.    Dispense:  60 capsule    Refill:  0     Sekai Gitlin Luther Parody, MD

## 2020-09-22 ENCOUNTER — Inpatient Hospital Stay (HOSPITAL_COMMUNITY)
Admission: EM | Admit: 2020-09-22 | Discharge: 2020-10-03 | DRG: 239 | Disposition: A | Discharge status: Home Health | Payer: Medicare HMO | Attending: Internal Medicine | Admitting: Internal Medicine

## 2020-09-22 ENCOUNTER — Encounter (HOSPITAL_COMMUNITY): Payer: Self-pay | Admitting: *Deleted

## 2020-09-22 ENCOUNTER — Telehealth (INDEPENDENT_AMBULATORY_CARE_PROVIDER_SITE_OTHER): Payer: Self-pay

## 2020-09-22 ENCOUNTER — Observation Stay (HOSPITAL_COMMUNITY): Payer: Medicare HMO

## 2020-09-22 DIAGNOSIS — L97919 Non-pressure chronic ulcer of unspecified part of right lower leg with unspecified severity: Secondary | ICD-10-CM | POA: Diagnosis not present

## 2020-09-22 DIAGNOSIS — E11621 Type 2 diabetes mellitus with foot ulcer: Secondary | ICD-10-CM | POA: Diagnosis present

## 2020-09-22 DIAGNOSIS — M1711 Unilateral primary osteoarthritis, right knee: Secondary | ICD-10-CM | POA: Diagnosis not present

## 2020-09-22 DIAGNOSIS — D75839 Thrombocytosis, unspecified: Secondary | ICD-10-CM | POA: Diagnosis not present

## 2020-09-22 DIAGNOSIS — L97519 Non-pressure chronic ulcer of other part of right foot with unspecified severity: Secondary | ICD-10-CM | POA: Diagnosis present

## 2020-09-22 DIAGNOSIS — Z85118 Personal history of other malignant neoplasm of bronchus and lung: Secondary | ICD-10-CM | POA: Diagnosis not present

## 2020-09-22 DIAGNOSIS — R195 Other fecal abnormalities: Secondary | ICD-10-CM | POA: Diagnosis not present

## 2020-09-22 DIAGNOSIS — E119 Type 2 diabetes mellitus without complications: Secondary | ICD-10-CM

## 2020-09-22 DIAGNOSIS — D509 Iron deficiency anemia, unspecified: Secondary | ICD-10-CM | POA: Diagnosis present

## 2020-09-22 DIAGNOSIS — Z79899 Other long term (current) drug therapy: Secondary | ICD-10-CM | POA: Diagnosis not present

## 2020-09-22 DIAGNOSIS — U071 COVID-19: Secondary | ICD-10-CM | POA: Diagnosis present

## 2020-09-22 DIAGNOSIS — Z66 Do not resuscitate: Secondary | ICD-10-CM | POA: Diagnosis not present

## 2020-09-22 DIAGNOSIS — I96 Gangrene, not elsewhere classified: Secondary | ICD-10-CM | POA: Diagnosis not present

## 2020-09-22 DIAGNOSIS — Z923 Personal history of irradiation: Secondary | ICD-10-CM

## 2020-09-22 DIAGNOSIS — Z833 Family history of diabetes mellitus: Secondary | ICD-10-CM | POA: Diagnosis not present

## 2020-09-22 DIAGNOSIS — G9389 Other specified disorders of brain: Secondary | ICD-10-CM | POA: Diagnosis not present

## 2020-09-22 DIAGNOSIS — K219 Gastro-esophageal reflux disease without esophagitis: Secondary | ICD-10-CM | POA: Diagnosis present

## 2020-09-22 DIAGNOSIS — D630 Anemia in neoplastic disease: Secondary | ICD-10-CM | POA: Diagnosis present

## 2020-09-22 DIAGNOSIS — L98499 Non-pressure chronic ulcer of skin of other sites with unspecified severity: Secondary | ICD-10-CM | POA: Diagnosis not present

## 2020-09-22 DIAGNOSIS — L89311 Pressure ulcer of right buttock, stage 1: Secondary | ICD-10-CM | POA: Diagnosis present

## 2020-09-22 DIAGNOSIS — E1151 Type 2 diabetes mellitus with diabetic peripheral angiopathy without gangrene: Secondary | ICD-10-CM | POA: Diagnosis present

## 2020-09-22 DIAGNOSIS — R197 Diarrhea, unspecified: Secondary | ICD-10-CM | POA: Diagnosis present

## 2020-09-22 DIAGNOSIS — E1152 Type 2 diabetes mellitus with diabetic peripheral angiopathy with gangrene: Secondary | ICD-10-CM | POA: Diagnosis not present

## 2020-09-22 DIAGNOSIS — R4182 Altered mental status, unspecified: Secondary | ICD-10-CM | POA: Diagnosis not present

## 2020-09-22 DIAGNOSIS — E876 Hypokalemia: Secondary | ICD-10-CM | POA: Diagnosis not present

## 2020-09-22 DIAGNOSIS — Z7984 Long term (current) use of oral hypoglycemic drugs: Secondary | ICD-10-CM

## 2020-09-22 DIAGNOSIS — I739 Peripheral vascular disease, unspecified: Secondary | ICD-10-CM | POA: Diagnosis present

## 2020-09-22 DIAGNOSIS — C7931 Secondary malignant neoplasm of brain: Secondary | ICD-10-CM | POA: Diagnosis present

## 2020-09-22 DIAGNOSIS — Z95828 Presence of other vascular implants and grafts: Secondary | ICD-10-CM | POA: Diagnosis not present

## 2020-09-22 DIAGNOSIS — L97512 Non-pressure chronic ulcer of other part of right foot with fat layer exposed: Secondary | ICD-10-CM | POA: Diagnosis not present

## 2020-09-22 DIAGNOSIS — Z86711 Personal history of pulmonary embolism: Secondary | ICD-10-CM

## 2020-09-22 DIAGNOSIS — E1169 Type 2 diabetes mellitus with other specified complication: Secondary | ICD-10-CM | POA: Diagnosis present

## 2020-09-22 DIAGNOSIS — I2782 Chronic pulmonary embolism: Secondary | ICD-10-CM | POA: Diagnosis not present

## 2020-09-22 DIAGNOSIS — C3411 Malignant neoplasm of upper lobe, right bronchus or lung: Secondary | ICD-10-CM | POA: Diagnosis not present

## 2020-09-22 DIAGNOSIS — I70261 Atherosclerosis of native arteries of extremities with gangrene, right leg: Secondary | ICD-10-CM | POA: Diagnosis present

## 2020-09-22 DIAGNOSIS — R404 Transient alteration of awareness: Secondary | ICD-10-CM | POA: Diagnosis not present

## 2020-09-22 DIAGNOSIS — Z7401 Bed confinement status: Secondary | ICD-10-CM | POA: Diagnosis not present

## 2020-09-22 DIAGNOSIS — L03115 Cellulitis of right lower limb: Secondary | ICD-10-CM | POA: Diagnosis present

## 2020-09-22 DIAGNOSIS — Z7189 Other specified counseling: Secondary | ICD-10-CM | POA: Diagnosis not present

## 2020-09-22 DIAGNOSIS — I10 Essential (primary) hypertension: Secondary | ICD-10-CM | POA: Diagnosis present

## 2020-09-22 DIAGNOSIS — E785 Hyperlipidemia, unspecified: Secondary | ICD-10-CM | POA: Diagnosis present

## 2020-09-22 DIAGNOSIS — D649 Anemia, unspecified: Secondary | ICD-10-CM | POA: Diagnosis not present

## 2020-09-22 DIAGNOSIS — D72829 Elevated white blood cell count, unspecified: Secondary | ICD-10-CM | POA: Diagnosis not present

## 2020-09-22 DIAGNOSIS — R7889 Finding of other specified substances, not normally found in blood: Secondary | ICD-10-CM | POA: Diagnosis not present

## 2020-09-22 DIAGNOSIS — S88112S Complete traumatic amputation at level between knee and ankle, left lower leg, sequela: Secondary | ICD-10-CM

## 2020-09-22 DIAGNOSIS — M869 Osteomyelitis, unspecified: Secondary | ICD-10-CM | POA: Diagnosis not present

## 2020-09-22 DIAGNOSIS — M868X6 Other osteomyelitis, lower leg: Secondary | ICD-10-CM | POA: Diagnosis present

## 2020-09-22 DIAGNOSIS — Z87891 Personal history of nicotine dependence: Secondary | ICD-10-CM | POA: Diagnosis not present

## 2020-09-22 DIAGNOSIS — Z7902 Long term (current) use of antithrombotics/antiplatelets: Secondary | ICD-10-CM | POA: Diagnosis not present

## 2020-09-22 DIAGNOSIS — I70248 Atherosclerosis of native arteries of left leg with ulceration of other part of lower left leg: Secondary | ICD-10-CM | POA: Diagnosis not present

## 2020-09-22 DIAGNOSIS — C3490 Malignant neoplasm of unspecified part of unspecified bronchus or lung: Secondary | ICD-10-CM | POA: Diagnosis not present

## 2020-09-22 DIAGNOSIS — Z89512 Acquired absence of left leg below knee: Secondary | ICD-10-CM | POA: Diagnosis not present

## 2020-09-22 DIAGNOSIS — L089 Local infection of the skin and subcutaneous tissue, unspecified: Secondary | ICD-10-CM | POA: Diagnosis not present

## 2020-09-22 DIAGNOSIS — I959 Hypotension, unspecified: Secondary | ICD-10-CM | POA: Diagnosis not present

## 2020-09-22 DIAGNOSIS — M24561 Contracture, right knee: Secondary | ICD-10-CM | POA: Diagnosis not present

## 2020-09-22 DIAGNOSIS — Z515 Encounter for palliative care: Secondary | ICD-10-CM | POA: Diagnosis not present

## 2020-09-22 DIAGNOSIS — L0390 Cellulitis, unspecified: Secondary | ICD-10-CM | POA: Diagnosis not present

## 2020-09-22 LAB — CBC
HCT: 20.6 % — ABNORMAL LOW (ref 38.5–50.0)
Hemoglobin: 5.5 g/dL — CL (ref 13.2–17.1)
MCH: 21.1 pg — ABNORMAL LOW (ref 27.0–33.0)
MCHC: 26.7 g/dL — ABNORMAL LOW (ref 32.0–36.0)
MCV: 78.9 fL — ABNORMAL LOW (ref 80.0–100.0)
MPV: 10.2 fL (ref 7.5–12.5)
Platelets: 624 10*3/uL — ABNORMAL HIGH (ref 140–400)
RBC: 2.61 10*6/uL — ABNORMAL LOW (ref 4.20–5.80)
RDW: 18 % — ABNORMAL HIGH (ref 11.0–15.0)
WBC: 13.8 10*3/uL — ABNORMAL HIGH (ref 3.8–10.8)

## 2020-09-22 LAB — CBC WITH DIFFERENTIAL/PLATELET
Abs Immature Granulocytes: 0.13 10*3/uL — ABNORMAL HIGH (ref 0.00–0.07)
Basophils Absolute: 0.1 10*3/uL (ref 0.0–0.1)
Basophils Relative: 1 %
Eosinophils Absolute: 0.1 10*3/uL (ref 0.0–0.5)
Eosinophils Relative: 1 %
HCT: 20.2 % — ABNORMAL LOW (ref 39.0–52.0)
Hemoglobin: 5.6 g/dL — CL (ref 13.0–17.0)
Immature Granulocytes: 1 %
Lymphocytes Relative: 7 %
Lymphs Abs: 1 10*3/uL (ref 0.7–4.0)
MCH: 21.9 pg — ABNORMAL LOW (ref 26.0–34.0)
MCHC: 27.7 g/dL — ABNORMAL LOW (ref 30.0–36.0)
MCV: 78.9 fL — ABNORMAL LOW (ref 80.0–100.0)
Monocytes Absolute: 1.1 10*3/uL — ABNORMAL HIGH (ref 0.1–1.0)
Monocytes Relative: 7 %
Neutro Abs: 13 10*3/uL — ABNORMAL HIGH (ref 1.7–7.7)
Neutrophils Relative %: 83 %
Platelets: 667 10*3/uL — ABNORMAL HIGH (ref 150–400)
RBC: 2.56 MIL/uL — ABNORMAL LOW (ref 4.22–5.81)
RDW: 21.2 % — ABNORMAL HIGH (ref 11.5–15.5)
WBC: 15.4 10*3/uL — ABNORMAL HIGH (ref 4.0–10.5)
nRBC: 1.2 % — ABNORMAL HIGH (ref 0.0–0.2)

## 2020-09-22 LAB — COMPLETE METABOLIC PANEL WITH GFR
AG Ratio: 0.8 (calc) — ABNORMAL LOW (ref 1.0–2.5)
ALT: 4 U/L — ABNORMAL LOW (ref 9–46)
AST: 5 U/L — ABNORMAL LOW (ref 10–35)
Albumin: 2.7 g/dL — ABNORMAL LOW (ref 3.6–5.1)
Alkaline phosphatase (APISO): 93 U/L (ref 35–144)
BUN/Creatinine Ratio: 20 (calc) (ref 6–22)
BUN: 12 mg/dL (ref 7–25)
CO2: 29 mmol/L (ref 20–32)
Calcium: 8.9 mg/dL (ref 8.6–10.3)
Chloride: 105 mmol/L (ref 98–110)
Creat: 0.6 mg/dL — ABNORMAL LOW (ref 0.70–1.18)
GFR, Est African American: 112 mL/min/{1.73_m2} (ref >=60)
GFR, Est Non African American: 96 mL/min/{1.73_m2} (ref >=60)
Globulin: 3.5 g/dL (calc) (ref 1.9–3.7)
Glucose, Bld: 149 mg/dL — ABNORMAL HIGH (ref 65–139)
Potassium: 4.8 mmol/L (ref 3.5–5.3)
Sodium: 140 mmol/L (ref 135–146)
Total Bilirubin: 0.2 mg/dL (ref 0.2–1.2)
Total Protein: 6.2 g/dL (ref 6.1–8.1)

## 2020-09-22 LAB — BASIC METABOLIC PANEL
Anion gap: 5 (ref 5–15)
BUN: 11 mg/dL (ref 8–23)
CO2: 28 mmol/L (ref 22–32)
Calcium: 8.3 mg/dL — ABNORMAL LOW (ref 8.9–10.3)
Chloride: 103 mmol/L (ref 98–111)
Creatinine, Ser: 0.65 mg/dL (ref 0.61–1.24)
GFR, Estimated: >60 mL/min (ref >60)
Glucose, Bld: 156 mg/dL — ABNORMAL HIGH (ref 70–99)
Potassium: 3.9 mmol/L (ref 3.5–5.1)
Sodium: 136 mmol/L (ref 135–145)

## 2020-09-22 LAB — HEMOGLOBIN AND HEMATOCRIT, BLOOD
HCT: 26.3 % — ABNORMAL LOW (ref 39.0–52.0)
Hemoglobin: 8.1 g/dL — ABNORMAL LOW (ref 13.0–17.0)

## 2020-09-22 LAB — HEMOGLOBIN A1C
Hgb A1c MFr Bld: 5.5 % of total Hgb (ref <5.7)
Mean Plasma Glucose: 111 mg/dL
eAG (mmol/L): 6.2 mmol/L

## 2020-09-22 LAB — LACTIC ACID, PLASMA: Lactic Acid, Venous: 1.6 mmol/L (ref 0.5–1.9)

## 2020-09-22 LAB — PROCALCITONIN: Procalcitonin: <0.1 ng/mL

## 2020-09-22 LAB — POC OCCULT BLOOD, ED: Fecal Occult Bld: POSITIVE — AB

## 2020-09-22 LAB — CBG MONITORING, ED: Glucose-Capillary: 137 mg/dL — ABNORMAL HIGH (ref 70–99)

## 2020-09-22 LAB — PREPARE RBC (CROSSMATCH)

## 2020-09-22 IMAGING — CT CT TIBIA FIBULA *R* W/ CM
2 of 3 series · 12 of 34 positions shown, 15 images · IV contrast (omnipaque)
Comparison: None.

CLINICAL DATA: 80-year-old male with right lower extremity ulcer
posteriorly. History of metastatic lung cancer.

EXAM:
CT OF THE LOWER RIGHT EXTREMITY WITH CONTRAST
TECHNIQUE: Multidetector CT imaging of the lower right extremity was performed
according to the standard protocol following intravenous contrast
administration.
CONTRAST:  75mL OMNIPAQUE IOHEXOL 300 MG/ML  SOLN

[Series 5: axial st · axial · 0.61mm/px · z∈[-1414,-970]mm · 9 of 264 slices shown, 12 images]
[im 21/264  soft-tissue]
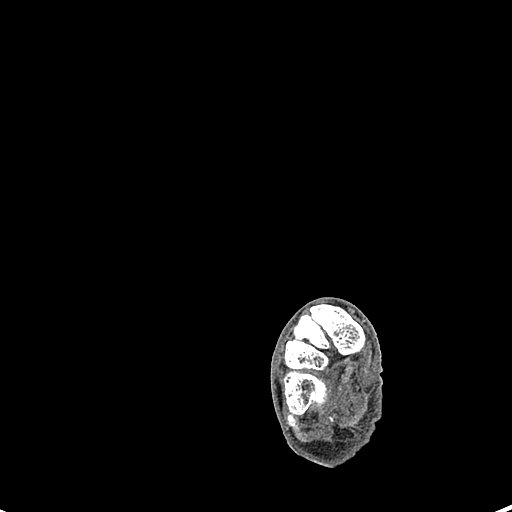
[im 21/264  bone]
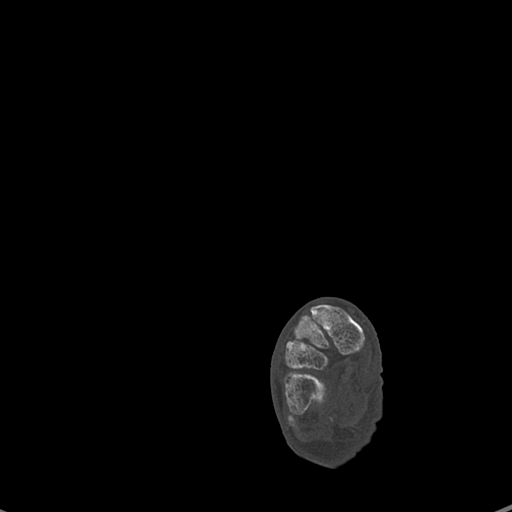
[im 61/264  bone]
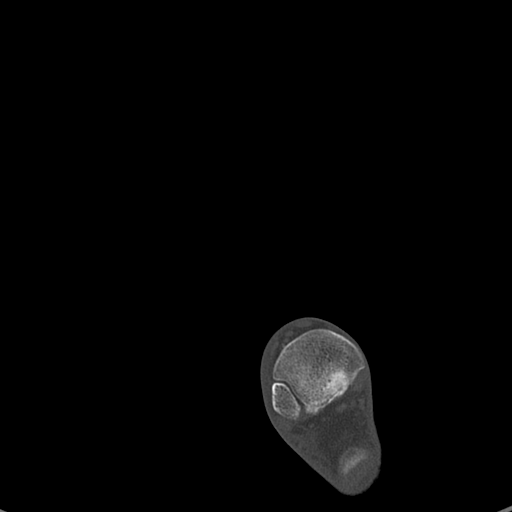
[im 81/264  bone]
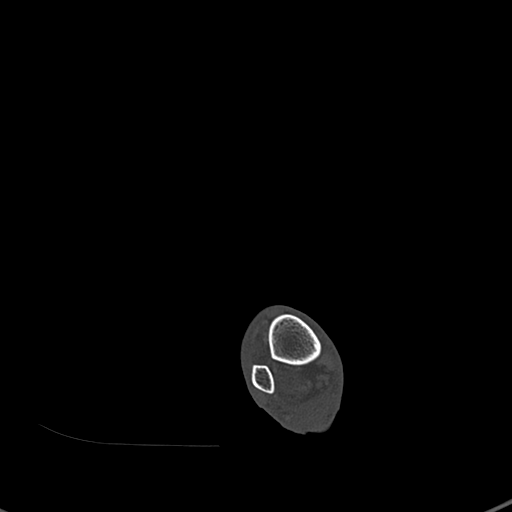
[im 102/264  bone]
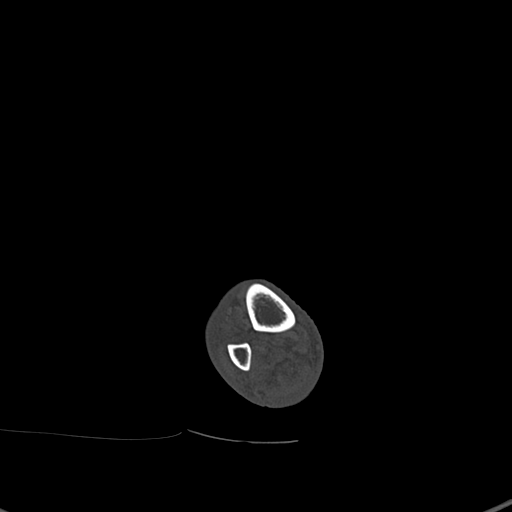
[im 142/264  soft-tissue]
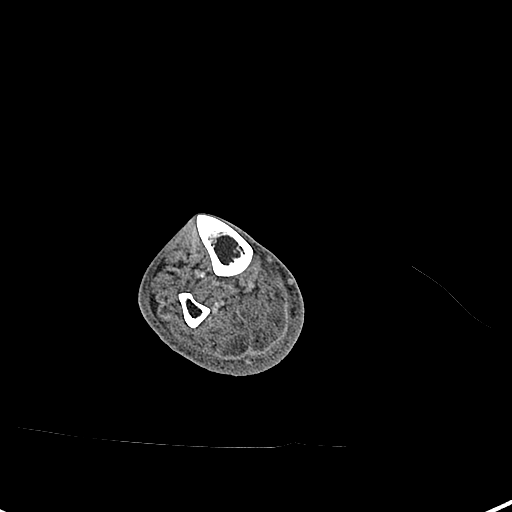
[im 142/264  bone]
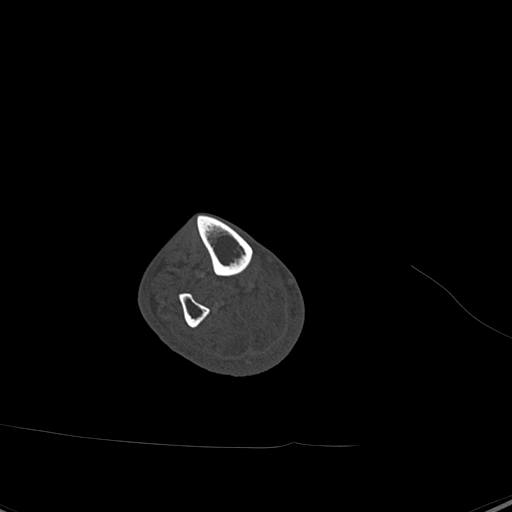
[im 162/264  bone]
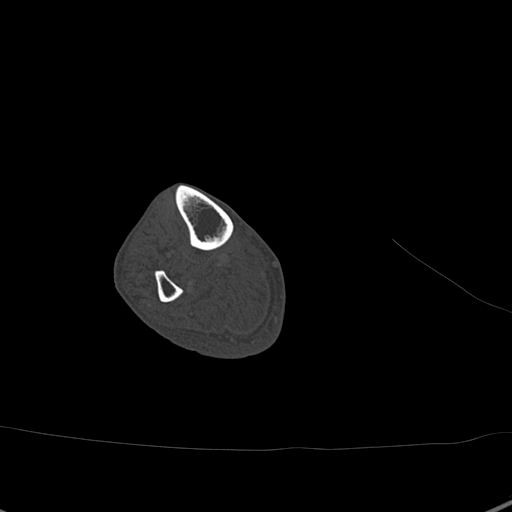
[im 183/264  bone]
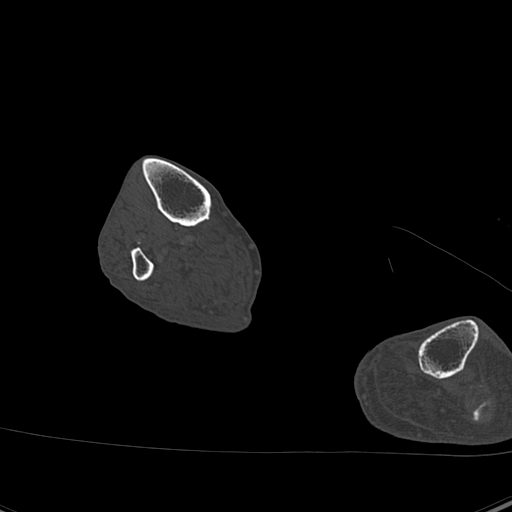
[im 223/264  bone]
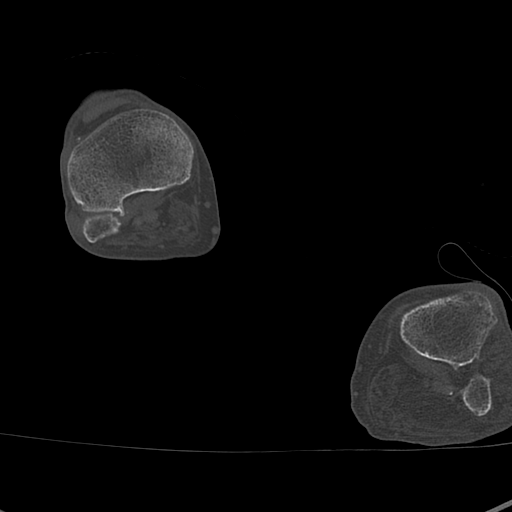
[im 243/264  soft-tissue]
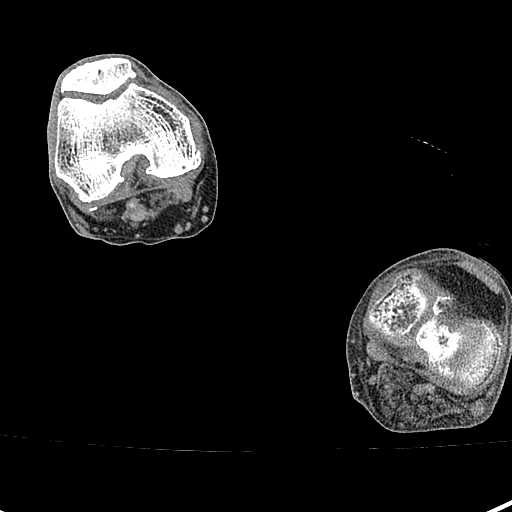
[im 243/264  bone]
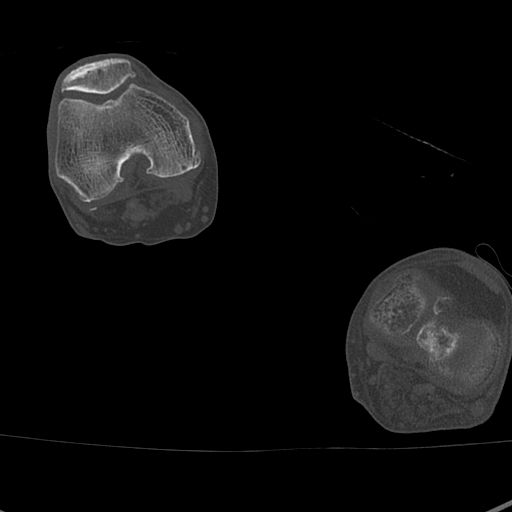

[Series 8: cor st · coronal · 0.38mm/px · 3 of 73 slices shown]
[im 16/73  bone]
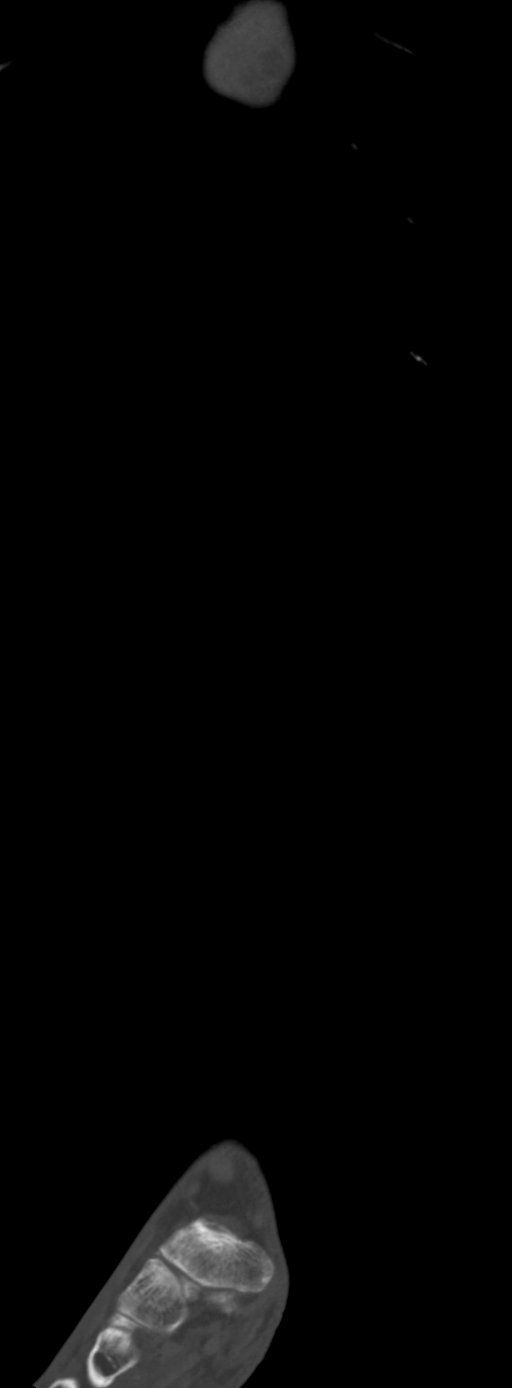
[im 30/73  bone]
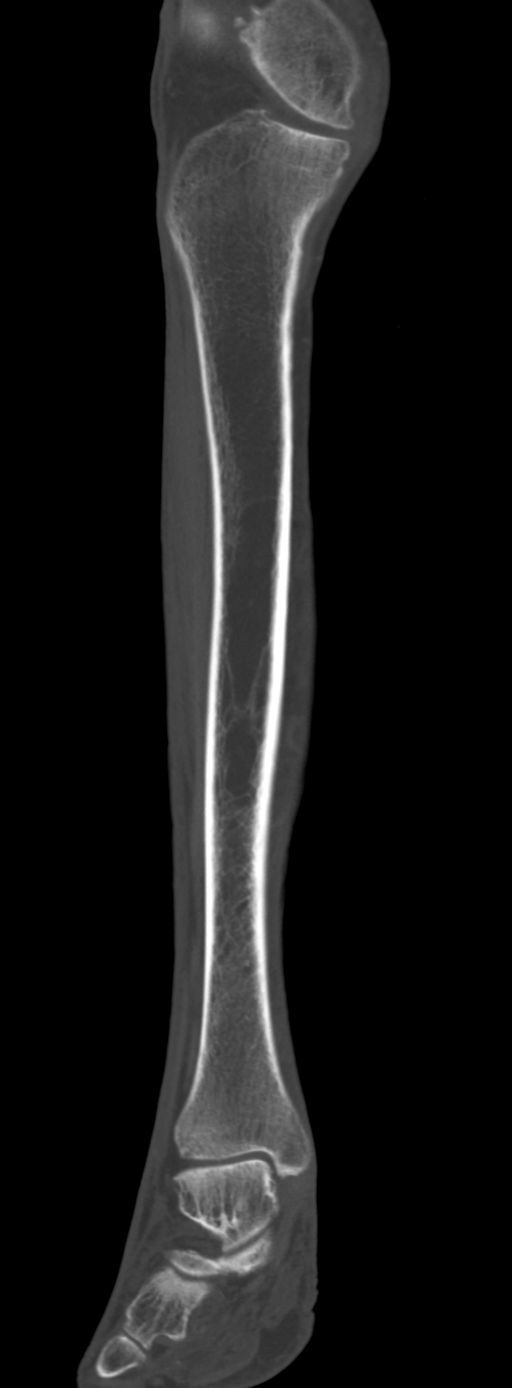
[im 44/73  bone]
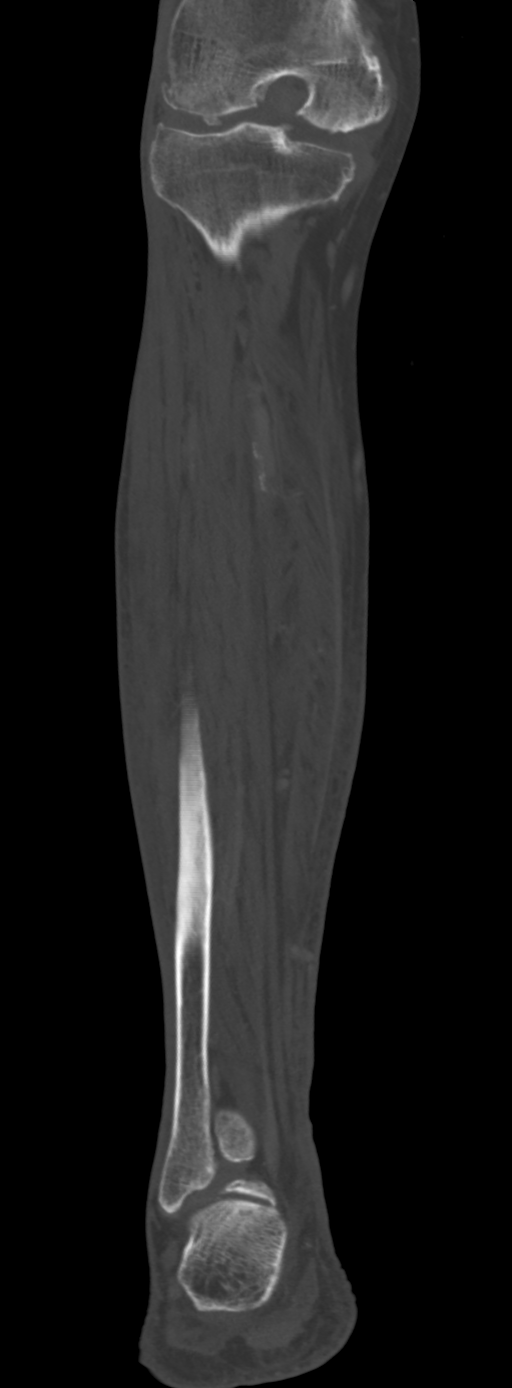

[12 of 34 positions shown; findings below may reference images not displayed]

FINDINGS: Bones/Joint/Cartilage

There is no acute fracture or dislocation. The bones are osteopenic.
There is moderate degenerative changes of the right knee.

Ligaments

Suboptimally assessed by CT.

Muscles and Tendons

No intramuscular fluid collection or hematoma.

Soft tissues

There is ulceration of the posterior skin over the distal lower
extremity. There is thickening of the skin and diffuse subcutaneous
edema. Subcutaneous air noted in the region of the ulceration. No
drainable fluid collection or abscess identified.
IMPRESSION: 1. No acute fracture or dislocation.
2. Ulceration of the posterior skin over the distal lower extremity.
No drainable fluid collection or abscess.

## 2020-09-22 MED ORDER — INSULIN ASPART 100 UNIT/ML IJ SOLN
0.0000 [IU] | Freq: Every day | INTRAMUSCULAR | Status: DC
Start: 2020-09-22 — End: 2020-10-03
  Administered 2020-09-27: 2 [IU] via SUBCUTANEOUS

## 2020-09-22 MED ORDER — MORPHINE SULFATE (PF) 2 MG/ML IV SOLN
2.0000 mg | INTRAVENOUS | Status: DC | PRN
  Administered 2020-09-24 – 2020-09-27 (×5): 2 mg via INTRAVENOUS
  Filled 2020-09-22 (×5): qty 1

## 2020-09-22 MED ORDER — MORPHINE SULFATE (PF) 2 MG/ML IV SOLN
2.0000 mg | Freq: Once | INTRAVENOUS | Status: AC
  Administered 2020-09-22: 2 mg via INTRAVENOUS
  Filled 2020-09-22: qty 1

## 2020-09-22 MED ORDER — ONDANSETRON HCL 4 MG/2ML IJ SOLN: 4.0000 mg | Freq: Four times a day (QID) | INTRAMUSCULAR | Status: DC | PRN

## 2020-09-22 MED ORDER — INSULIN ASPART 100 UNIT/ML IJ SOLN
0.0000 [IU] | Freq: Three times a day (TID) | INTRAMUSCULAR | Status: DC
  Administered 2020-09-23 (×2): 2 [IU] via SUBCUTANEOUS
  Administered 2020-09-24: 3 [IU] via SUBCUTANEOUS
  Administered 2020-09-24 – 2020-09-25 (×2): 2 [IU] via SUBCUTANEOUS
  Administered 2020-09-25: 1 [IU] via SUBCUTANEOUS
  Administered 2020-09-25 – 2020-09-29 (×6): 2 [IU] via SUBCUTANEOUS
  Administered 2020-09-30: 3 [IU] via SUBCUTANEOUS
  Administered 2020-09-30: 1 [IU] via SUBCUTANEOUS
  Administered 2020-10-01 – 2020-10-02 (×4): 2 [IU] via SUBCUTANEOUS

## 2020-09-22 MED ORDER — IOHEXOL 300 MG/ML  SOLN
75.0000 mL | Freq: Once | INTRAMUSCULAR | Status: AC | PRN
  Administered 2020-09-22: 75 mL via INTRAVENOUS

## 2020-09-22 MED ORDER — ONDANSETRON HCL 4 MG PO TABS: 4.0000 mg | ORAL_TABLET | Freq: Four times a day (QID) | ORAL | Status: DC | PRN

## 2020-09-22 MED ORDER — TRAZODONE HCL 50 MG PO TABS
50.0000 mg | ORAL_TABLET | Freq: Every day | ORAL | Status: DC
  Administered 2020-09-23 – 2020-10-02 (×9): 50 mg via ORAL
  Filled 2020-09-22 (×11): qty 1

## 2020-09-22 MED ORDER — SODIUM CHLORIDE 0.9 % IV SOLN
1.0000 g | INTRAVENOUS | Status: DC
  Administered 2020-09-22: 1 g via INTRAVENOUS
  Filled 2020-09-22: qty 10

## 2020-09-22 MED ORDER — SODIUM CHLORIDE 0.9 % IV BOLUS
500.0000 mL | Freq: Once | INTRAVENOUS | Status: AC
  Administered 2020-09-22: 500 mL via INTRAVENOUS

## 2020-09-22 MED ORDER — ACETAMINOPHEN 650 MG RE SUPP: 650.0000 mg | Freq: Four times a day (QID) | RECTAL | Status: DC | PRN

## 2020-09-22 MED ORDER — PANTOPRAZOLE SODIUM 40 MG IV SOLR
40.0000 mg | INTRAVENOUS | Status: DC
  Administered 2020-09-23 (×2): 40 mg via INTRAVENOUS
  Filled 2020-09-22 (×2): qty 40

## 2020-09-22 MED ORDER — MORPHINE SULFATE (PF) 2 MG/ML IV SOLN: 2.0000 mg | INTRAVENOUS | Status: DC | PRN

## 2020-09-22 MED ORDER — SODIUM CHLORIDE 0.9 % IV SOLN
10.0000 mL/h | Freq: Once | INTRAVENOUS | Status: AC
  Administered 2020-09-22: 10 mL/h via INTRAVENOUS

## 2020-09-22 MED ORDER — SODIUM CHLORIDE 0.9 % IV SOLN: INTRAVENOUS | Status: DC

## 2020-09-22 MED ORDER — ACETAMINOPHEN 325 MG PO TABS
650.0000 mg | ORAL_TABLET | Freq: Four times a day (QID) | ORAL | Status: DC | PRN
Start: 2020-09-22 — End: 2020-10-03
  Administered 2020-09-23 – 2020-09-24 (×2): 650 mg via ORAL
  Filled 2020-09-22 (×2): qty 2

## 2020-09-22 NOTE — Telephone Encounter (Signed)
-----   Message from Doree Albee, MD sent at 09/22/2020  7:56 AM EDT ----- Please call the patient/patient's brother this morning and tell him that he needs to go to the emergency room immediately.  His hemoglobin is extremely low down to 5.5.  He may need to be hospitalized.

## 2020-09-22 NOTE — ED Triage Notes (Signed)
[facility] transported pt here due to hbg level is low. Pt denies any sob or pain.

## 2020-09-22 NOTE — Progress Notes (Signed)
Pt is wasre . Should be in route to ER. WE have 2 encounters open for this messages.

## 2020-09-22 NOTE — Telephone Encounter (Signed)
Currently per pt is brother is now. Son was there, but he "checkout" on his dad. Will not answer them or come by at all. So I am calling the all numbers until someone answers me. If I do not hear from them by 11:30 am I will to a "well check" by local EMS/ [facility].

## 2020-09-22 NOTE — ED Notes (Signed)
Date and time results received: 09/22/20 1337 (use smartphrase ".now" to insert current time)   Test: Hgb Critical Value: 5.6  Name of Provider Notified: Roderic Palau, MD  Orders Received? Or Actions Taken?:

## 2020-09-22 NOTE — Telephone Encounter (Signed)
Called son's phone LVM 2x on RE of father Thomas Garcia, it is URGENT for him to call back.

## 2020-09-22 NOTE — ED Notes (Signed)
Phlebotomy just obtained bc x 2

## 2020-09-22 NOTE — ED Notes (Signed)
Hgb 5.5 from yesterday's labs.

## 2020-09-22 NOTE — Telephone Encounter (Signed)
The correct number for the patients brother Kyrin Gratz is [phone]. I called this number and was sent to voice mail and not able to leave a VM due to full mailbox. Sending so we can both try to reach [name] who is the care giver.

## 2020-09-22 NOTE — ED Notes (Signed)
BP low in triage.

## 2020-09-22 NOTE — ED Notes (Signed)
Pt very uncomfortable with right let. Offered to get pain med, pt states pain meds make it worse. Pillow under right leg for comfort.

## 2020-09-22 NOTE — Progress Notes (Signed)
Called all phone. Will keep calling until I get someone.

## 2020-09-22 NOTE — Telephone Encounter (Signed)
Patients brother Sachit Gilman [phone] called back and I gave him the message per Dr. Anastasio Champion and told him to call 911 to have EMS come out for patient due to hemoglobin levels. Charles verbalized an understanding and stated that Ed could hear me also since he had his phone on speaker phone. Both verbalized an understanding and will call 911 after we disconnect.

## 2020-09-22 NOTE — Telephone Encounter (Signed)
Thank you. I called as well. It is a duplicate note in two encounters.

## 2020-09-22 NOTE — ED Provider Notes (Signed)
[facility] EMERGENCY DEPARTMENT Provider Note   CSN: [id number] Arrival date & time: 09/22/20  1152     History Chief Complaint  Patient presents with   Anemia    [facility] Garcia is a 80 y.o. 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 Delton Coombes presenting today for a blood transfusion.  He was seen by his PCP Dr. Anastasio Champion and had screening blood work done in the office yesterday and was found to have a hemoglobin of 5.5.  The patient denies any specific symptoms including lightheadedness, shortness of breath, chest pain.  He does however endorse generalized fatigue.  He denies any obvious blood loss, he is not had any rectal bleeding or melena like stools.He last required a blood transfusion 2 months ago.  The history is provided by the patient.      Past Medical History:  Diagnosis Date   Diabetes mellitus without complication ([place])    Kidney stones    Lung cancer ([place]) 2022   PONV (postoperative nausea and vomiting)     Patient Active Problem List   Diagnosis Date Noted   Acute on chronic anemia 09/22/2020   Malignant neoplasm of upper lobe of right lung ([place]) 08/04/2020   Brain metastasis ([place])    Right-sided nontraumatic intracerebral hemorrhage (HCC)    Pulmonary embolism ([place]) 06/27/2020   Brain mass 06/26/2020   Hyponatremia 06/26/2020   Microcytic anemia 06/26/2020   Cavitating mass in right upper lung lobe 05/27/2020   Colon cancer screening 06/22/2019   Abnormality of gait 11/06/2018   Diabetes mellitus type 2 in nonobese [facility])    Phantom limb pain (HCC)    Postoperative pain    Unilateral complete BKA, left, sequela (HCC)    Acute blood loss anemia    Essential hypertension    Neuropathic pain    Unilateral complete BKA, left, initial encounter (HCC)    Tobacco abuse    Benign essential HTN     Diabetic peripheral neuropathy (HCC)    Post-operative pain    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HCC)    PVD (peripheral vascular disease) ([place])    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 ([place]) 09/08/2018   Osteomyelitis of second toe of left foot ([place]) 09/08/2018   Cellulitis of left foot 09/08/2018   Leukocytosis 09/08/2018   Fever and chills 09/08/2018   MOLE 10/26/2008   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 ([place]) 10/26/2008   OVERWEIGHT 10/26/2008   NICOTINE ADDICTION 10/26/2008   ACUTE CYSTITIS 10/26/2008   FATIGUE 10/26/2008   COUGH 10/26/2008   ELECTROCARDIOGRAM, ABNORMAL 10/26/2008    Past Surgical History:  Procedure Laterality Date   ABDOMINAL AORTOGRAM W/LOWER EXTREMITY N/A 09/17/2018   Procedure: ABDOMINAL AORTOGRAM W/LOWER EXTREMITY;  Surgeon: Marty Heck, MD;  Location: [place] [facility];  Service: Cardiovascular;  Laterality: N/A;   AMPUTATION Left 09/22/2018   Procedure: AMPUTATION BELOW KNEE LEFT;  Surgeon: Marty Heck, MD;  Location: [place];  Service: Vascular;  Laterality: Left;   AMPUTATION TOE Left 09/10/2018   Procedure: AMPUTATION OF THIRD TOE LEFT FOOT, DEBRIDEMENT OF ULCERS ON SECOND TOE AND PARTIAL AMPUTATION SECOND TOE ON LEFT FOOT;  Surgeon: Virl Cagey, MD;  Location: [facility];  Service: General;  Laterality: Left;   APPLICATION OF CRANIAL NAVIGATION N/A 07/08/2020   Procedure: APPLICATION OF CRANIAL NAVIGATION;  Surgeon: Dawley,  Theodoro Doing, DO;  Location: [place];  Service: Neurosurgery;  Laterality: N/A;   APPLICATION OF WOUND VAC Left 09/18/2018   Procedure: APPLICATION OF WOUND VAC;  Surgeon: Marty Heck, MD;  Location: [place];  Service: Vascular;  Laterality: Left;   CRANIOTOMY Right 07/08/2020   Procedure: Right Occipital craniotomy for tumor resection with brainlab;  Surgeon: Dawley, Theodoro Doing, DO;  Location: [place];  Service: Neurosurgery;  Laterality: Right;   CYSTOSCOPY  05/08/2012   Procedure: CYSTOSCOPY;  Surgeon: Marissa Nestle, MD;  Location: [facility];  Service: Urology;  Laterality: N/A;  Evacuation of clots   IR IVC FILTER PLMT / S&I /IMG GUID/MOD SED  07/05/2020   KIDNEY SURGERY     sutures    PERIPHERAL VASCULAR BALLOON ANGIOPLASTY  09/17/2018   Procedure: PERIPHERAL VASCULAR BALLOON ANGIOPLASTY;  Surgeon: Marty Heck, MD;  Location: [place] [facility];  Service: Cardiovascular;;  LT SFA and AT   RADIOLOGY WITH ANESTHESIA N/A 06/30/2020   Procedure: MRI WITH ANESTHESIA   BRAIN WITH AND WITHOUT CONTRAST;  Surgeon: Radiologist, Medication, MD;  Location: [place];  Service: Radiology;  Laterality: N/A;   SPLENECTOMY     TRANSMETATARSAL AMPUTATION Left 09/18/2018   Procedure: TRANSMETATARSAL AMPUTATION LEFT FOOT;  Surgeon: Marty Heck, MD;  Location: [place];  Service: Vascular;  Laterality: Left;   TRANSURETHRAL RESECTION OF PROSTATE  05/14/2012   Procedure: TRANSURETHRAL RESECTION OF THE PROSTATE (TURP);  Surgeon: Marissa Nestle, MD;  [facility];  Service: Urology;  Laterality: N/A;       Family History  Problem Relation Age of Onset   Diabetes Mother     Social History   Tobacco Use   Smoking status: Former    Packs/day: 0.50    Years: 45.00    Pack years: 22.50    Types: Cigarettes   Smokeless tobacco: Never   Tobacco comments:    4 cigarettes per day  Vaping Use   Vaping Use: Never used  Substance Use Topics   Alcohol use: No   Drug use: No    Home Medications Prior to Admission medications   Medication Sig Start Date End Date Taking? Authorizing Provider  acetaminophen (TYLENOL) 325 MG tablet Take 2 tablets (650 mg total) by mouth every 4 (four) hours as needed for headache or mild pain. Patient taking differently: Take 650 mg by mouth daily. 09/29/18  Yes Angiulli, Lavon Paganini, PA-C  atorvastatin (LIPITOR) 10 MG tablet Take 1 tablet (10 mg total) by mouth daily at 6 PM. 04/14/20  Yes Gosrani, Nimish C, MD  Cal Carb-Mag Hydrox-Simeth (ROLAIDS ADVANCED) 1000-200-40 MG CHEW  Chew 1 tablet by mouth daily as needed (for indigestion).   Yes [provider]  clopidogrel (PLAVIX) 75 MG tablet Take 1 tablet (75 mg total) by mouth daily. 07/23/20  Yes Lavina Hamman, MD  ferrous sulfate 324 MG TBEC Take 324 mg by mouth daily with breakfast.   Yes [provider]  lisinopril (ZESTRIL) 5 MG tablet Take 1 tablet (5 mg total) by mouth daily. 04/14/20  Yes Gosrani, Nimish C, MD  loperamide (IMODIUM) 2 MG capsule Take 1 capsule (2 mg total) by mouth as needed for diarrhea or loose stools. 09/21/20  Yes Gosrani, Nimish C, MD  metFORMIN (GLUCOPHAGE) 500 MG tablet Take 1 tablet (500 mg total) by mouth 2 (two) times daily with a meal. 04/14/20  Yes Gosrani, Nimish C, MD  oxycodone (OXY-IR) 5 MG capsule Take 1 capsule (  5 mg total) by mouth 2 (two) times daily as needed. 09/21/20  Yes Gosrani, Nimish C, MD  pantoprazole (PROTONIX) 40 MG tablet Take 1 tablet (40 mg total) by mouth daily at 6 (six) AM. 07/14/20  Yes Lavina Hamman, MD  saccharomyces boulardii (FLORASTOR) 250 MG capsule Take 1 capsule (250 mg total) by mouth 2 (two) times daily. 07/13/20  Yes Lavina Hamman, MD  traZODone (DESYREL) 50 MG tablet Take 1 tablet (50 mg total) by mouth at bedtime. 04/14/20  Yes Gosrani, Nimish C, MD  levETIRAcetam (KEPPRA) 500 MG tablet Take 1 tablet (500 mg total) by mouth 2 (two) times daily for 3 days. 07/12/20 09/21/20  Dawley, Troy C, DO  nicotine (NICODERM CQ) 14 mg/24hr patch Place 1 patch (14 mg total) onto the skin daily. Patient not taking: Reported on 09/22/2020 11/30/19   Doree Albee, MD    Allergies    Patient has no known allergies.  Review of Systems   Review of Systems  Constitutional:  Positive for appetite change, fatigue and unexpected weight change. Negative for fever.  HENT:  Negative for congestion and sore throat.   Eyes: Negative.   Respiratory:  Negative for chest tightness and shortness of breath.   Cardiovascular:  Negative for chest pain.   Gastrointestinal:  Negative for abdominal pain, nausea and vomiting.  Genitourinary: Negative.   Musculoskeletal:  Negative for arthralgias, joint swelling and neck pain.  Skin:  Positive for wound. Negative for rash.       Patient has chronic wounds on his right foot and ankle under the care of of podiatry.  Patient states he will probably be undergoing an amputation of this extremity.  Neurological:  Positive for weakness. Negative for dizziness, light-headedness, numbness and headaches.  Psychiatric/Behavioral: Negative.     Physical Exam Updated Vital Signs BP (!) 114/46   Pulse (!) 109   Temp 98.6 F (37 C) (Oral)   Resp 16   Ht 5\' 9"  (1.753 m)   Wt 77.1 kg   SpO2 98%   BMI 25.10 kg/m   Physical Exam Vitals and nursing note reviewed.  Constitutional:      General: He is not in acute distress.    Appearance: He is well-developed. He is not toxic-appearing.     Comments: Cachexia  HENT:     Head: Normocephalic and atraumatic.  Eyes:     Conjunctiva/sclera: Conjunctivae normal.     Comments: Pale conjunctiva  Cardiovascular:     Rate and Rhythm: Regular rhythm. Tachycardia present.     Pulses: Normal pulses.     Heart sounds: Normal heart sounds.  Pulmonary:     Effort: Pulmonary effort is normal.     Breath sounds: Normal breath sounds. No wheezing.  Abdominal:     General: Bowel sounds are normal. There is no distension.     Palpations: Abdomen is soft.     Tenderness: There is no abdominal tenderness. There is no guarding.  Musculoskeletal:        General: Normal range of motion.     Cervical back: Normal range of motion.     Comments: Clean fresh dressing on right foot and ankle.  Skin:    General: Skin is warm and dry.  Neurological:     Mental Status: He is alert.    ED Results / Procedures / Treatments   Labs (all labs ordered are listed, but only abnormal results are displayed) Labs Reviewed  CBC WITH DIFFERENTIAL/PLATELET - Abnormal;  Notable for  the following components:      Result Value   WBC 15.4 (*)    RBC 2.56 (*)    Hemoglobin 5.6 (*)    HCT 20.2 (*)    MCV 78.9 (*)    MCH 21.9 (*)    MCHC 27.7 (*)    RDW 21.2 (*)    Platelets 667 (*)    nRBC 1.2 (*)    Neutro Abs 13.0 (*)    Monocytes Absolute 1.1 (*)    Abs Immature Granulocytes 0.13 (*)    All other components within normal limits  BASIC METABOLIC PANEL - Abnormal; Notable for the following components:   Glucose, Bld 156 (*)    Calcium 8.3 (*)    All other components within normal limits  LACTIC ACID, PLASMA  POC OCCULT BLOOD, ED  TYPE AND SCREEN  PREPARE RBC (CROSSMATCH)    EKG None  Radiology No results found.  Procedures Procedures   Medications Ordered in ED Medications  sodium chloride 0.9 % bolus 500 mL (0 mLs Intravenous Stopped 09/22/20 1458)  0.9 %  sodium chloride infusion (10 mL/hr Intravenous New Bag/Given 09/22/20 1446)    ED Course  I have reviewed the triage vital signs and the nursing notes.  Pertinent labs & imaging results that were available during my care of the patient were reviewed by me and considered in my medical decision making (see chart for details).    MDM Rules/Calculators/A&P                          Patient will require admission given profound anemia and need for multiple units of packed RBCs.  We will plan for admission once screening labs are completed.  Labs completed, hemoglobin is 5.6.  Transfusion has been initiated.  Call placed for admission.  6:04 PM Call from hospitalist service.  Dr Denton Brick accepts pt for admission. Final Clinical Impression(s) / ED Diagnoses Final diagnoses:  Symptomatic anemia    Rx / DC Orders ED Discharge Orders     None        Landis Martins 09/22/20 1804    Milton Ferguson, MD 09/23/20 [phone]

## 2020-09-22 NOTE — Progress Notes (Signed)
Please call the patient/patient's brother this morning and tell him that he needs to go to the emergency room immediately.  His hemoglobin is extremely low down to 5.5.  He may need to be hospitalized.

## 2020-09-22 NOTE — H&P (Addendum)
History and Physical    Thomas Garcia QQP:[id number] DOB: 08-Sep-1940 DOA: 09/22/2020  PCP: Doree Albee, MD   Patient coming from: Home  I have personally briefly reviewed patient's old medical records in [place]  Chief Complaint: Low hemoglobin  HPI: Thomas Garcia is a 80 y.o. male with medical history significant for stage IV lung cancer with brain metastasis, intracranial hemorrhage, PAD, pulmonary embolism, hypertension, DM, osteomyelitis with left BKA.  Patient was sent to the ED with reports of low hemoglobin of 5.5 done at PCPs office.  Patient reports generalized weakness.  He denies difficulty breathing, no chest pain.  His main problem is his right leg for which he has been having pain.  He has an ulcer involving the right leg, he has a nurse that comes in to check on it and dress it daily.  He reports discharge from the leg.  Reports poor oral intake, he has no appetite, he reports he has been having diarrhea twice daily since his hospitalization in April   He denies black stools, no blood in stools, no vomiting.  He reports he has not coughed up blood in over a month. And the last time he did it was a very small amount.  Recent prolonged hospitalization-3/20 to 07/14/2020 for right upper lobe lung cancer, right occipital mass, pulmonary embolism.  He was started on full anticoagulation but this was held due to hemoptysis and bleeding from brain mass.  A retrievable IVC filter 3/29.  Further decision on anticoagulation was deferred until patient followed up with pulmonology postdischarge.  Required 1 unit of PRBC during hospitalization.  ED Course: T-max 99.4.  Heart rate 99-118, respiratory rate 16-26, blood pressure systolic initially 71/24.  Improved to 580D systolic with 983JA bolus.  Lactic acid normal 1.6.  Leukocytosis of 15.4.  Hemoglobin 5.6.  2 units PRBC ordered for transfusion hospitalist admit for acute on chronic anemia.  Review of Systems: As per HPI  all other systems reviewed and negative.  Past Medical History:  Diagnosis Date   Diabetes mellitus without complication ([place])    Kidney stones    Lung cancer ([place]) 2022   PONV (postoperative nausea and vomiting)     Past Surgical History:  Procedure Laterality Date   ABDOMINAL AORTOGRAM W/LOWER EXTREMITY N/A 09/17/2018   Procedure: ABDOMINAL AORTOGRAM W/LOWER EXTREMITY;  Surgeon: Marty Heck, MD;  Location: [place] [facility];  Service: Cardiovascular;  Laterality: N/A;   AMPUTATION Left 09/22/2018   Procedure: AMPUTATION BELOW KNEE LEFT;  Surgeon: Marty Heck, MD;  Location: [place];  Service: Vascular;  Laterality: Left;   AMPUTATION TOE Left 09/10/2018   Procedure: AMPUTATION OF THIRD TOE LEFT FOOT, DEBRIDEMENT OF ULCERS ON SECOND TOE AND PARTIAL AMPUTATION SECOND TOE ON LEFT FOOT;  Surgeon: Virl Cagey, MD;  Location: [facility];  Service: General;  Laterality: Left;   APPLICATION OF CRANIAL NAVIGATION N/A 07/08/2020   Procedure: APPLICATION OF CRANIAL NAVIGATION;  Surgeon: Karsten Ro, DO;  Location: [place];  Service: Neurosurgery;  Laterality: N/A;   APPLICATION OF WOUND VAC Left 09/18/2018   Procedure: APPLICATION OF WOUND VAC;  Surgeon: Marty Heck, MD;  Location: [place];  Service: Vascular;  Laterality: Left;   CRANIOTOMY Right 07/08/2020   Procedure: Right Occipital craniotomy for tumor resection with brainlab;  Surgeon: Dawley, Theodoro Doing, DO;  Location: [place];  Service: Neurosurgery;  Laterality: Right;   CYSTOSCOPY  05/08/2012   Procedure: CYSTOSCOPY;  Surgeon: Inda Merlin  Rubin Payor, MD;  Location: [facility];  Service: Urology;  Laterality: N/A;  Evacuation of clots   IR IVC FILTER PLMT / S&I /IMG GUID/MOD SED  07/05/2020   KIDNEY SURGERY     sutures    PERIPHERAL VASCULAR BALLOON ANGIOPLASTY  09/17/2018   Procedure: PERIPHERAL VASCULAR BALLOON ANGIOPLASTY;  Surgeon: Marty Heck, MD;  Location: [place] [facility];  Service: Cardiovascular;;  LT SFA and AT    RADIOLOGY WITH ANESTHESIA N/A 06/30/2020   Procedure: MRI WITH ANESTHESIA   BRAIN WITH AND WITHOUT CONTRAST;  Surgeon: Radiologist, Medication, MD;  Location: [place];  Service: Radiology;  Laterality: N/A;   SPLENECTOMY     TRANSMETATARSAL AMPUTATION Left 09/18/2018   Procedure: TRANSMETATARSAL AMPUTATION LEFT FOOT;  Surgeon: Marty Heck, MD;  Location: [place];  Service: Vascular;  Laterality: Left;   TRANSURETHRAL RESECTION OF PROSTATE  05/14/2012   Procedure: TRANSURETHRAL RESECTION OF THE PROSTATE (TURP);  Surgeon: Marissa Nestle, MD;  [facility];  Service: Urology;  Laterality: N/A;     reports that he has quit smoking. His smoking use included cigarettes. He has a 22.50 pack-year smoking history. He has never used smokeless tobacco. He reports that he does not drink alcohol and does not use drugs.  No Known Allergies  Family History  Problem Relation Age of Onset   Diabetes Mother     Prior to Admission medications   Medication Sig Start Date End Date Taking? Authorizing Provider  acetaminophen (TYLENOL) 325 MG tablet Take 2 tablets (650 mg total) by mouth every 4 (four) hours as needed for headache or mild pain. Patient taking differently: Take 650 mg by mouth daily. 09/29/18  Yes Angiulli, Lavon Paganini, PA-C  atorvastatin (LIPITOR) 10 MG tablet Take 1 tablet (10 mg total) by mouth daily at 6 PM. 04/14/20  Yes Gosrani, Nimish C, MD  Cal Carb-Mag Hydrox-Simeth (ROLAIDS ADVANCED) 1000-200-40 MG CHEW Chew 1 tablet by mouth daily as needed (for indigestion).   Yes [provider]  clopidogrel (PLAVIX) 75 MG tablet Take 1 tablet (75 mg total) by mouth daily. 07/23/20  Yes Lavina Hamman, MD  ferrous sulfate 324 MG TBEC Take 324 mg by mouth daily with breakfast.   Yes [provider]  lisinopril (ZESTRIL) 5 MG tablet Take 1 tablet (5 mg total) by mouth daily. 04/14/20  Yes Gosrani, Nimish C, MD  loperamide (IMODIUM) 2 MG capsule Take 1 capsule (2 mg total) by mouth as  needed for diarrhea or loose stools. 09/21/20  Yes Gosrani, Nimish C, MD  metFORMIN (GLUCOPHAGE) 500 MG tablet Take 1 tablet (500 mg total) by mouth 2 (two) times daily with a meal. 04/14/20  Yes Gosrani, Nimish C, MD  oxycodone (OXY-IR) 5 MG capsule Take 1 capsule (5 mg total) by mouth 2 (two) times daily as needed. 09/21/20  Yes Gosrani, Nimish C, MD  pantoprazole (PROTONIX) 40 MG tablet Take 1 tablet (40 mg total) by mouth daily at 6 (six) AM. 07/14/20  Yes Lavina Hamman, MD  saccharomyces boulardii (FLORASTOR) 250 MG capsule Take 1 capsule (250 mg total) by mouth 2 (two) times daily. 07/13/20  Yes Lavina Hamman, MD  traZODone (DESYREL) 50 MG tablet Take 1 tablet (50 mg total) by mouth at bedtime. 04/14/20  Yes Gosrani, Nimish C, MD  levETIRAcetam (KEPPRA) 500 MG tablet Take 1 tablet (500 mg total) by mouth 2 (two) times daily for 3 days. 07/12/20 09/21/20  Dawley, Troy C, DO  nicotine (NICODERM  CQ) 14 mg/24hr patch Place 1 patch (14 mg total) onto the skin daily. Patient not taking: Reported on 09/22/2020 11/30/19   Doree Albee, MD    Physical Exam: Vitals:   09/22/20 1712 09/22/20 1715 09/22/20 1730 09/22/20 1745  BP: (!) 131/56  (!) 114/46   Pulse: (!) 109 (!) 108 (!) 112 (!) 109  Resp: (!) 25 (!) 26 17 16   Temp: 98.6 F (37 C)     TempSrc: Oral     SpO2: 100% 98% 100% 98%  Weight:      Height:        Constitutional: Acutely and chronically ill-appearing, appears to be in pain from his right lower extremity,   Vitals:   09/22/20 1712 09/22/20 1715 09/22/20 1730 09/22/20 1745  BP: (!) 131/56  (!) 114/46   Pulse: (!) 109 (!) 108 (!) 112 (!) 109  Resp: (!) 25 (!) 26 17 16   Temp: 98.6 F (37 C)     TempSrc: Oral     SpO2: 100% 98% 100% 98%  Weight:      Height:       Eyes: PERRL, lids and conjunctivae normal ENMT: Mucous membranes are dry Neck: normal, supple, no masses, no thyromegaly Respiratory: clear to auscultation bilaterally, no wheezing, no crackles. Normal respiratory  effort. No accessory muscle use.  Cardiovascular: Regular rate and rhythm, no murmurs / rubs / gallops. No extremity edema- RLE. 2+ pedal pulses.   Abdomen: no tenderness, no masses palpated. No hepatosplenomegaly. Bowel sounds positive.  Musculoskeletal: no clubbing / cyanosis.  Left BKA.  Good ROM, no contractures. Normal muscle tone.  Skin: Foul smell appreciated on entering into room, patient has dressing on right lower extremity, on removing dressing, patient has ulcer to posterior mid and lower aspect of right Tib- fibula area, with foul-smelling discharge on dressing.  Wounds appeared black, necrotic appearing scabs, unstageable.  Reports wound that started a month ago, started with a blister and expanded. Differential warmth and tenderness present to right lower extremity. Neurologic: No apparent cranial nerve abnormality, moving extremities spontaneously. Psychiatric: Normal judgment and insight. Alert and oriented x 3. Normal mood.          Labs on Admission: I have personally reviewed following labs and imaging studies  CBC: Recent Labs  Lab 09/21/20 1426 09/22/20 1301  WBC 13.8* 15.4*  NEUTROABS  --  13.0*  HGB 5.5* 5.6*  HCT 20.6* 20.2*  MCV 78.9* 78.9*  PLT 624* 097*   Basic Metabolic Panel: Recent Labs  Lab 09/21/20 1426 09/22/20 1301  NA 140 136  K 4.8 3.9  CL 105 103  CO2 29 28  GLUCOSE 149* 156*  BUN 12 11  CREATININE 0.60* 0.65  CALCIUM 8.9 8.3*   Liver Function Tests: Recent Labs  Lab 09/21/20 1426  AST 5*  ALT 4*  BILITOT 0.2  PROT 6.2    HbA1C: Recent Labs    09/21/20 1426  HGBA1C 5.5    Radiological Exams on Admission: No results found.  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  With anemia panel suggesting iron deficiency anemia with iron low at 12, iron saturation low at 3 with normal TIBC.  Borderline low ferritin at 46.  No obvious GI blood loss.  Stool FOBT positive. -IV Protonix 40 daily -Transfuse 2 units PRBC -Follow-up hemoglobin  Right lower extremity ulcer with cellulitis -appears necrotic.  Differential warmth and tenderness, MRI not available at this time will obtain CT tib- fibula with contrast.  Tachycardic up to 119-dehydration versus sepsis, also with leukocytosis of 15.4.  T-max 99.4.  He rules in for sepsis. Art dopplers + ABI-2020 resting right ankle-brachial index indicates moderate right lower extremity arterial disease abnormal toe brachial index. - IV ceftriazone, with Ct negative for abscess.  - Procalcitonin - Blood cultures x 2  -500 mill bolus given, continue N/s 75cc/hr x 15hrs with PRBC -Obtain Right arterial Dopplers with ABI -General surgery consulted in the morning, if patient would benefit from debridement - IV morphine  Hypotension-systolic 06/23 now 762/83.  Status post 500 mill bolus and PRBcs.  Likely secondary to poor oral intake, chronic stable diarrhea of 2 months. - hydrate  Controlled diabetes mellitus-glucose 156.  A1c 6.4 - SSI- S - Hold metformin  Peripheral vascular disease, osteomyelitis with left BKA. -Hold Plavix for now pending CT and general surgery evaluation tomorrow  Recent pulmonary embolism-anticoagulation was held due to hemoptysis and intracranial hemorrhage.  Retrievable IVC filter was placed.  Right lung cancer with brain metastasis-patient is undergoing radiation treatment to brain and lungs, last treatment 6/10.  Following  with Dr. Lisbeth Renshaw.   DVT prophylaxis: SCDS, with acute anemia  Code Status: DNR- as documented on recent hospitalization. Family Communication: None at bedside Disposition Plan: ~ > 2 days Consults called: Gen Surg Admission status: Inpt, tele  I certify that at the point of admission it is my clinical judgment that the patient will require inpatient hospital care spanning beyond 2 midnights from the point of admission due to high intensity of service, high risk for further deterioration and high frequency of surveillance required.   Bethena Roys MD [facility]  09/22/2020, 9:32 PM

## 2020-09-23 ENCOUNTER — Inpatient Hospital Stay (HOSPITAL_COMMUNITY): Payer: Medicare HMO

## 2020-09-23 DIAGNOSIS — L97519 Non-pressure chronic ulcer of other part of right foot with unspecified severity: Secondary | ICD-10-CM | POA: Diagnosis present

## 2020-09-23 DIAGNOSIS — L97512 Non-pressure chronic ulcer of other part of right foot with fat layer exposed: Secondary | ICD-10-CM

## 2020-09-23 LAB — TYPE AND SCREEN
ABO/RH(D): A POS
Antibody Screen: NEGATIVE
Unit division: 0
Unit division: 0

## 2020-09-23 LAB — BPAM RBC
Blood Product Expiration Date: 202206162359
Blood Product Expiration Date: 202206182359
ISSUE DATE / TIME: 202206161441
ISSUE DATE / TIME: 202206161653
Unit Type and Rh: 600
Unit Type and Rh: 600

## 2020-09-23 LAB — CBC WITH DIFFERENTIAL/PLATELET
Abs Immature Granulocytes: 0.17 10*3/uL — ABNORMAL HIGH (ref 0.00–0.07)
Basophils Absolute: 0 10*3/uL (ref 0.0–0.1)
Basophils Relative: 0 %
Eosinophils Absolute: 0 10*3/uL (ref 0.0–0.5)
Eosinophils Relative: 0 %
HCT: 24.2 % — ABNORMAL LOW (ref 39.0–52.0)
Hemoglobin: 7.5 g/dL — ABNORMAL LOW (ref 13.0–17.0)
Immature Granulocytes: 1 %
Lymphocytes Relative: 4 %
Lymphs Abs: 0.9 10*3/uL (ref 0.7–4.0)
MCH: 24.4 pg — ABNORMAL LOW (ref 26.0–34.0)
MCHC: 31 g/dL (ref 30.0–36.0)
MCV: 78.8 fL — ABNORMAL LOW (ref 80.0–100.0)
Monocytes Absolute: 1.6 10*3/uL — ABNORMAL HIGH (ref 0.1–1.0)
Monocytes Relative: 7 %
Neutro Abs: 18.9 10*3/uL — ABNORMAL HIGH (ref 1.7–7.7)
Neutrophils Relative %: 88 %
Platelets: 522 10*3/uL — ABNORMAL HIGH (ref 150–400)
RBC: 3.07 MIL/uL — ABNORMAL LOW (ref 4.22–5.81)
RDW: 19.1 % — ABNORMAL HIGH (ref 11.5–15.5)
WBC: 21.5 10*3/uL — ABNORMAL HIGH (ref 4.0–10.5)
nRBC: 0.3 % — ABNORMAL HIGH (ref 0.0–0.2)

## 2020-09-23 LAB — CBC
HCT: 25.9 % — ABNORMAL LOW (ref 39.0–52.0)
Hemoglobin: 8 g/dL — ABNORMAL LOW (ref 13.0–17.0)
MCH: 24.8 pg — ABNORMAL LOW (ref 26.0–34.0)
MCHC: 30.9 g/dL (ref 30.0–36.0)
MCV: 80.2 fL (ref 80.0–100.0)
Platelets: 558 10*3/uL — ABNORMAL HIGH (ref 150–400)
RBC: 3.23 MIL/uL — ABNORMAL LOW (ref 4.22–5.81)
RDW: 18.7 % — ABNORMAL HIGH (ref 11.5–15.5)
WBC: 21.5 10*3/uL — ABNORMAL HIGH (ref 4.0–10.5)
nRBC: 0.7 % — ABNORMAL HIGH (ref 0.0–0.2)

## 2020-09-23 LAB — RETICULOCYTES
Immature Retic Fract: 37.7 % — ABNORMAL HIGH (ref 2.3–15.9)
RBC.: 3.22 MIL/uL — ABNORMAL LOW (ref 4.22–5.81)
Retic Count, Absolute: 78.9 10*3/uL (ref 19.0–186.0)
Retic Ct Pct: 2.5 % (ref 0.4–3.1)

## 2020-09-23 LAB — GLUCOSE, CAPILLARY
Glucose-Capillary: 117 mg/dL — ABNORMAL HIGH (ref 70–99)
Glucose-Capillary: 129 mg/dL — ABNORMAL HIGH (ref 70–99)
Glucose-Capillary: 160 mg/dL — ABNORMAL HIGH (ref 70–99)
Glucose-Capillary: 175 mg/dL — ABNORMAL HIGH (ref 70–99)
Glucose-Capillary: 160 mg/dL — ABNORMAL HIGH (ref 70–99)

## 2020-09-23 LAB — BASIC METABOLIC PANEL
Anion gap: 6 (ref 5–15)
BUN: 9 mg/dL (ref 8–23)
CO2: 28 mmol/L (ref 22–32)
Calcium: 8 mg/dL — ABNORMAL LOW (ref 8.9–10.3)
Chloride: 104 mmol/L (ref 98–111)
Creatinine, Ser: 0.52 mg/dL — ABNORMAL LOW (ref 0.61–1.24)
GFR, Estimated: >60 mL/min (ref >60)
Glucose, Bld: 122 mg/dL — ABNORMAL HIGH (ref 70–99)
Potassium: 3.6 mmol/L (ref 3.5–5.1)
Sodium: 138 mmol/L (ref 135–145)

## 2020-09-23 LAB — IRON AND TIBC
Iron: 5 ug/dL — ABNORMAL LOW (ref 45–182)
Saturation Ratios: 2 % — ABNORMAL LOW (ref 17.9–39.5)
TIBC: 244 ug/dL — ABNORMAL LOW (ref 250–450)
UIBC: 239 ug/dL

## 2020-09-23 LAB — FERRITIN: Ferritin: 67 ng/mL (ref 24–336)

## 2020-09-23 LAB — PROCALCITONIN: Procalcitonin: 0.15 ng/mL

## 2020-09-23 MED ORDER — SODIUM CHLORIDE 0.9 % IV BOLUS
500.0000 mL | Freq: Once | INTRAVENOUS | Status: AC
  Administered 2020-09-23: 500 mL via INTRAVENOUS

## 2020-09-23 MED ORDER — ENSURE ENLIVE PO LIQD
237.0000 mL | Freq: Two times a day (BID) | ORAL | Status: DC
  Administered 2020-09-23 – 2020-10-03 (×19): 237 mL via ORAL

## 2020-09-23 MED ORDER — CLOPIDOGREL BISULFATE 75 MG PO TABS: 75.0000 mg | ORAL_TABLET | Freq: Every day | ORAL | Status: DC

## 2020-09-23 MED ORDER — PROSOURCE PLUS PO LIQD
30.0000 mL | Freq: Three times a day (TID) | ORAL | Status: DC
  Administered 2020-09-23 – 2020-10-03 (×23): 30 mL via ORAL
  Filled 2020-09-23 (×25): qty 30

## 2020-09-23 MED ORDER — LEVETIRACETAM 500 MG PO TABS
500.0000 mg | ORAL_TABLET | Freq: Two times a day (BID) | ORAL | Status: DC
  Administered 2020-09-23 – 2020-10-03 (×21): 500 mg via ORAL
  Filled 2020-09-23 (×21): qty 1

## 2020-09-23 MED ORDER — ATORVASTATIN CALCIUM 10 MG PO TABS
10.0000 mg | ORAL_TABLET | Freq: Every day | ORAL | Status: DC
  Administered 2020-09-23 – 2020-10-03 (×11): 10 mg via ORAL
  Filled 2020-09-23 (×13): qty 1

## 2020-09-23 MED ORDER — ENSURE ENLIVE PO LIQD: 237.0000 mL | Freq: Two times a day (BID) | ORAL | Status: DC

## 2020-09-23 MED ORDER — CEFAZOLIN SODIUM-DEXTROSE 2-4 GM/100ML-% IV SOLN
2.0000 g | Freq: Three times a day (TID) | INTRAVENOUS | Status: DC
  Administered 2020-09-23 – 2020-09-29 (×20): 2 g via INTRAVENOUS
  Filled 2020-09-23 (×24): qty 100

## 2020-09-23 MED ORDER — PROSIGHT PO TABS
1.0000 | ORAL_TABLET | Freq: Every day | ORAL | Status: DC
  Administered 2020-09-23 – 2020-10-03 (×11): 1 via ORAL
  Filled 2020-09-23 (×11): qty 1

## 2020-09-23 NOTE — Progress Notes (Signed)
Initial Nutrition Assessment  DOCUMENTATION CODES:      INTERVENTION:  Ensure Enlive po BID, each supplement provides 350 kcal and 20 grams of protein   ProSource Plus 30 ml TID (each 30 ml provides 100 kcal, 15 gr protein)   Magic cup daily with lunch meal, provides 290 kcal and 9 grams of protein   Daily Multivitamin  NUTRITION DIAGNOSIS:   Increased nutrient needs related to wound healing as evidenced by estimated needs.   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   Per MD-Patient is being transferred to [facility]- vascular surgery consulted.  Patient ate 50% of his breakfast. Independent with feeding.   Weight history reviewed. Unable to assess change- need updated weight. Estimated nutrition requirements based on adjusted IBW.   Medications: Insulin, Keppra, Protonix.  IV-Ancef  Labs: 6/15-HGB A1C-5.5% BMP Latest Ref Rng & Units 09/23/2020 09/22/2020 09/21/2020  Glucose 70 - 99 mg/dL 122(H) 156(H) 149(H)  BUN 8 - 23 mg/dL 9 11 12   Creatinine 0.61 - 1.24 mg/dL 0.52(L) 0.65 0.60(L)  BUN/Creat Ratio 6 - 22 (calc) - - 20  Sodium 135 - 145 mmol/L 138 136 140  Potassium 3.5 - 5.1 mmol/L 3.6 3.9 4.8  Chloride 98 - 111 mmol/L 104 103 105  CO2 22 - 32 mmol/L 28 28 29   Calcium 8.9 - 10.3 mg/dL 8.0(L) 8.3(L) 8.9      NUTRITION - FOCUSED PHYSICAL EXAM:  Unable to complete Nutrition-Focused physical exam at this time.   Diet Order:   Diet Order             Diet regular Room service appropriate? 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 Right lower extremity dry, flaky skin   Last BM:  Prior to admission  Height:   Ht Readings from Last 1 Encounters:  09/22/20 5\' 9"  (1.753 m)    Weight:   Wt Readings from Last 1 Encounters:  09/22/20 77.1 kg    Adjusted Ideal Body Weight:   69 kg  BMI:  Body mass index is 25.1 kg/m.  Estimated Nutritional Needs:   Kcal:  4097-3532  Protein:  97-110  Fluid:  > 2liters daily   Colman Cater MS,RD,CSG,LDN Contact: [email or web address]

## 2020-09-23 NOTE — Progress Notes (Signed)
Called report to RN at [facility] on 6N. Patient transported by [facility] to [facility].

## 2020-09-23 NOTE — Care Management Important Message (Signed)
Important Message  Patient Details  Name: Thomas Garcia MRN: [id number] Date of Birth: [birth year]   Medicare Important Message Given:  Yes     [name] 09/23/2020, 12:47 PM

## 2020-09-23 NOTE — Plan of Care (Signed)

## 2020-09-23 NOTE — Progress Notes (Signed)
   09/23/20 2012  Assess: MEWS Score  Temp (!) 100.6 F (38.1 C)  BP 133/65  Pulse Rate (!) 115  SpO2 100 %  O2 Device Room Air  Assess: MEWS Score  MEWS Temp 1  MEWS Systolic 0  MEWS Pulse 2  MEWS RR 0  MEWS LOC 0  MEWS Score 3  MEWS Score Color Yellow  Assess: if the MEWS score is Yellow or Red  Were vital signs taken at a resting state? Yes  Focused Assessment No change from prior assessment  Early Detection of Sepsis Score *See Row Information* Medium  MEWS guidelines implemented *See Row Information* Yes  Treat  MEWS Interventions Administered prn meds/treatments  Take Vital Signs  Increase Vital Sign Frequency  Yellow: Q 2hr X 2 then Q 4hr X 2, if remains yellow, continue Q 4hrs  Escalate  MEWS: Escalate Yellow: discuss with charge nurse/RN and consider discussing with provider and RRT  Notify: Charge Nurse/RN  Name of Charge Nurse/RN Notified Nancy,RN  Date Charge Nurse/RN Notified 09/23/20  Time Charge Nurse/RN Notified 2018  Notify: Provider  Provider Name/Title Noberto Retort  Date Provider Notified 09/23/20  Time Provider Notified 2020  Notification Type Page  Notification Reason Change in status  Provider response No new orders  Document  Patient Outcome Not stable and remains on department

## 2020-09-23 NOTE — Progress Notes (Signed)
   09/23/20 2221  Assess: MEWS Score  Temp 98.1 F (36.7 C)  BP (!) 93/48  Pulse Rate (!) 104  Resp 18  SpO2 98 %  O2 Device Room Air  Assess: MEWS Score  MEWS Temp 0  MEWS Systolic 1  MEWS Pulse 1  MEWS RR 0  MEWS LOC 0  MEWS Score 2  MEWS Score Color Yellow  Assess: if the MEWS score is Yellow or Red  Were vital signs taken at a resting state? Yes  Focused Assessment No change from prior assessment  Early Detection of Sepsis Score *See Row Information* High Lovey Newcomer made aware)  MEWS guidelines implemented *See Row Information* No, previously yellow, continue vital signs every 4 hours  Escalate  MEWS: Escalate Yellow: discuss with charge nurse/RN and consider discussing with provider and RRT  Notify: Charge Nurse/RN  Name of Charge Nurse/RN Notified Nancy,RN  Date Charge Nurse/RN Notified 09/23/20  Time Charge Nurse/RN Notified 2222  Notify: Provider  Provider Name/Title Noberto Retort  Date Provider Notified 09/23/20  Time Provider Notified 2225  Notification Type Page  Notification Reason Other (Comment) (lower BP; High(Detection of Sepsis))  Provider response See new orders  Date of Provider Response 09/23/20  Time of Provider Response 2238    Repaged Noberto Retort about repeat V/S, Patient still YELLOW MEWS with lower BP and still tachycardic, new orders received. Patient is alert and verbal. Denies any pain at this moment. Call call bell within reach and will continue to monitor.

## 2020-09-23 NOTE — Plan of Care (Signed)
  Problem: Education: Goal: Knowledge of General Education information will improve Description Including pain rating scale, medication(s)/side effects and non-pharmacologic comfort measures Outcome: Progressing   Problem: Health Behavior/Discharge Planning: Goal: Ability to manage health-related needs will improve Outcome: Progressing   

## 2020-09-23 NOTE — Progress Notes (Addendum)
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  Apparently, he had routine blood work that revealed a hemoglobin of 5.5.  He was directed to the emergency department for further evaluation. The patient himself is a difficult historian at best.  He has poor insight into his medical illnesses.  Nevertheless, the patient had a prolonged hospitalization from 06/26/2020 to 07/14/2020 at which time the patient was diagnosed with adenocarcinoma of the right lung when he presented with a cavitary lung mass.  During that hospitalization, the patient underwent stereotactic resection of a brain mass which was felt to be metastasis from his lung cancer.  In addition his hospitalization was prolonged because of the PE.  He was initially started on anticoagulation, but this was stopped secondary to the patient's hemoptysis and bleeding from his brain mass.  Retrievable IVC filter was placed on 07/05/2020.  Further anticoagulation was deferred until he had followed up with pulmonology postdischarge.  During that hospitalization, the patient was also treated for postobstructive pneumonia.  Unfortunately, he has had poor follow-up since his discharge from the hospital.  He has not yet followed up with medical oncology.  However the patient did undergo radiation to the brain from 08/25/2020 to 08/31/2020.  The patient himself denies any new hemoptysis, hematochezia, melena, hematemesis.  He denies any NSAID use.  Review of the medical record shows the patient has iron deficiency anemia.  On 06/28/2020, he had iron saturation of 3% with ferritin 46.  He denies any chest pain,  shortness breath, nausea, vomiting, diarrhea, abdominal pain, dysuria, hematuria.  He denies any headache, visual disturbance, focal extremity weakness.  However, he has been complaining of malaise and low energy.  Notably, the patient has been complaining of worsening right lower extremity pain.  He has had a history of left BKA performed by Dr. Monica Martinez secondary to nonhealing wound and osteomyelitis.  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  He states that his right lower extremity has been hurting him for the last 2 years, but states that in the past 1 to 2 months he has had worsening resting pain in his right lower extremity.  He has had eschar type lesions the duration of which he is unclear of.  He has been lost to follow-up with vascular surgery.  He has not had any recent ABIs.  In the emergency department, the patient was noted to have low-grade temperature 99.4 F with tachycardia up to 119.  He was hemodynamically stable.  Oxygen saturation is 95-97% on room air.  BMP was unremarkable with serum creatinine 0.65.  WBC 15.4, hemoglobin 5.6, platelets 667,000.  PCT was <0.10 FOBT was positive.  CT of the right lower extremity was negative for any abscess.  Assessment/Plan: Acute on chronic anemia/symptomatic anemia -FOBT was positive but on po iron -no hematochezia or melena -will ultimately need GI evaluation at some point -Transfused 2 units PRBC -Follow hemoglobin -Repeat iron studies -hold plavix temporarily -continue PPI  Right lower extremity ulcerations/ischemia -I am concerned about progressive ischemia in his right lower extremity given the patient's worsening resting pain and decreased sensation  of his right foot -Transfer to [facility] -Case was discussed with vascular surgery, Dr. Trula Slade, who will consult after transfer -holding Plavix temporarily  Cellulitis right lower extremity -Start cefazolin  Diabetes mellitus type 2,  controlled -09/21/2020 hemoglobin A1c 5.5 -Holding metformin  Hyperlipidemia -Continue statin  Metastatic adenocarcinoma of the lung to the brain -Patient has yet to follow-up with Dr. Montey Hora since his discharge from the hospital 07/14/20 -Status post XRT to the brain 08/25/2020-08/31/2020 -Continue Keppra for seizure prophylaxis  Pulmonary embolus -Status post IVC filter 07/05/2020 -He is not on anticoagulation secondary to hemoptysis ambulating from his brain mass       Status is: Inpatient  Remains inpatient appropriate because:Ongoing active pain requiring inpatient pain management  Dispo: The patient is from: Home              Anticipated d/c is to: Home              Patient currently is not medically stable to d/c.   Difficult to place patient No        Family Communication:   no Family at bedside  Consultants:  VVS-[facility]  Code Status:  FULL   DVT Prophylaxis:  SCDs   Procedures: As Listed in Progress Note Above  Antibiotics: Ceftriaxone 6/16 Cefazolin 6/17     Subjective: Patient complains of right leg pain.  Patient denies fevers, chills, headache, chest pain, dyspnea, nausea, vomiting, diarrhea, abdominal pain, dysuria, hematuria, hematochezia, and melena.   Objective: Vitals:   09/22/20 2020 09/22/20 2148 09/23/20 0031 09/23/20 0347  BP: (!) 138/58  121/82 (!) 162/68  Pulse: 99  (!) 110 98  Resp: 17  19 19   Temp: 99.4 F (37.4 C)  98.2 F (36.8 C) 98.3 F (36.8 C)  TempSrc: Oral     SpO2: 100% 94% 97% 94%  Weight:      Height:        Intake/Output Summary (Last 24 hours) at 09/23/2020 0750 Last data filed at 09/23/2020 0400 Gross per 24 hour  Intake 1331.24 ml  Output --  Net 1331.24 ml   Weight change:  Exam:  General:  Pt is alert, follows commands appropriately, not in acute distress HEENT: No icterus, No thrush, No neck mass, [place]/AT Cardiovascular: RRR, S1/S2, no rubs, no gallops Respiratory: CTA bilaterally,  no wheezing, no crackles, no rhonchi Abdomen: Soft/+BS, non tender, non distended, no guarding Extremities: see pictures of right leg     Data Reviewed: I have personally reviewed following labs and imaging studies Basic Metabolic Panel: Recent Labs  Lab 09/21/20 1426 09/22/20 1301 09/23/20 0638  NA 140 136 138  K 4.8 3.9 3.6  CL 105 103 104  CO2 29 28 28   GLUCOSE 149* 156* 122*  BUN 12 11 9   CREATININE 0.60* 0.65 0.52*  CALCIUM 8.9 8.3* 8.0*   Liver Function Tests: Recent Labs  Lab 09/21/20 1426  AST 5*  ALT 4*  BILITOT 0.2  PROT 6.2   No results for input(s): LIPASE, AMYLASE in the last 168 hours. No results for input(s): AMMONIA in the last 168 hours. Coagulation Profile: No results for input(s): INR, PROTIME in the last 168 hours. CBC: Recent Labs  Lab 09/21/20 1426 09/22/20 1301 09/22/20 2203 09/23/20 0638  WBC 13.8* 15.4*  --  21.5*  NEUTROABS  --  13.0*  --   --   HGB 5.5* 5.6* 8.1* 8.0*  HCT 20.6* 20.2* 26.3* 25.9*  MCV 78.9* 78.9*  --  80.2  PLT  624* 667*  --  558*   Cardiac Enzymes: No results for input(s): CKTOTAL, CKMB, CKMBINDEX, TROPONINI in the last 168 hours. BNP: Invalid input(s): POCBNP CBG: Recent Labs  Lab 09/22/20 2038 09/23/20 0712  GLUCAP 137* 117*   HbA1C: Recent Labs    09/21/20 1426  HGBA1C 5.5   Urine analysis:    Component Value Date/Time   COLORURINE YELLOW 06/27/2020 0615   APPEARANCEUR CLEAR 06/27/2020 0615   LABSPEC 1.016 06/27/2020 0615   PHURINE 5.0 06/27/2020 0615   GLUCOSEU 50 (A) 06/27/2020 0615   HGBUR NEGATIVE 06/27/2020 0615   HGBUR trace-intact 10/26/2008 0000   BILIRUBINUR NEGATIVE 06/27/2020 0615   KETONESUR 5 (A) 06/27/2020 0615   PROTEINUR 100 (A) 06/27/2020 0615   UROBILINOGEN 0.2 04/27/2012 1142   NITRITE NEGATIVE 06/27/2020 0615   LEUKOCYTESUR NEGATIVE 06/27/2020 0615   Sepsis Labs: @LABRCNTIP (procalcitonin:4,lacticidven:4) ) Recent Results (from the past 240 hour(s))  Culture,  blood (routine x 2)     Status: None (Preliminary result)   Collection Time: 09/22/20 10:01 PM   Specimen: BLOOD  Result Value Ref Range Status   Specimen Description BLOOD RIGHT ANTECUBITAL  Final   Special Requests   Final    BOTTLES DRAWN AEROBIC AND ANAEROBIC Blood Culture adequate volume   Culture   Final    NO GROWTH < 12 HOURS Performed at [facility], [address], [place], [place] [address]    Report Status PENDING  Incomplete  Culture, blood (routine x 2)     Status: None (Preliminary result)   Collection Time: 09/22/20 10:03 PM   Specimen: BLOOD  Result Value Ref Range Status   Specimen Description BLOOD LEFT ANTECUBITAL  Final   Special Requests   Final    BOTTLES DRAWN AEROBIC AND ANAEROBIC Blood Culture adequate volume   Culture   Final    NO GROWTH < 12 HOURS Performed at [facility], [address], [place], [place] [address]    Report Status PENDING  Incomplete     Scheduled Meds:  feeding supplement  237 mL Oral BID BM   feeding supplement  237 mL Oral BID BM   insulin aspart  0-5 Units Subcutaneous QHS   insulin aspart  0-9 Units Subcutaneous TID WC   pantoprazole (PROTONIX) IV  40 mg Intravenous Q24H   traZODone  50 mg Oral QHS   Continuous Infusions:  sodium chloride 75 mL/hr at 09/23/20 0156   cefTRIAXone (ROCEPHIN)  IV 1 g (09/22/20 2336)    Procedures/Studies: CT TIBIA FIBULA RIGHT W CONTRAST  Result Date: 09/22/2020 CLINICAL DATA:  80 year old male with right lower extremity ulcer posteriorly. History of metastatic lung cancer. EXAM: CT OF THE LOWER RIGHT EXTREMITY WITH CONTRAST TECHNIQUE: Multidetector CT imaging of the lower right extremity was performed according to the standard protocol following intravenous contrast administration. CONTRAST:  8mL OMNIPAQUE IOHEXOL 300 MG/ML  SOLN COMPARISON:  None. FINDINGS: Bones/Joint/Cartilage There is no acute fracture or dislocation. 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 There is moderate degenerative changes of the  right knee. Ligaments Suboptimally assessed by CT. Muscles and Tendons No intramuscular fluid collection or hematoma. Soft tissues There is ulceration of the posterior skin over the distal lower extremity. There is thickening of the skin and diffuse subcutaneous edema. Subcutaneous air noted in the region of the ulceration. No drainable fluid collection or abscess identified. IMPRESSION: 1. No acute fracture or dislocation. 2. Ulceration of the posterior skin over the distal lower extremity. No drainable fluid collection or abscess. Electronically Signed  By: Anner Crete M.D.   On: 09/22/2020 21:10    Orson Eva, DO  [facility]  If 7PM-7AM, please contact night-coverage [email or web address] Password [email or web address] 09/23/2020, 7:50 AM   LOS: 1 day

## 2020-09-24 ENCOUNTER — Inpatient Hospital Stay (HOSPITAL_COMMUNITY): Payer: Medicare HMO

## 2020-09-24 DIAGNOSIS — Z7189 Other specified counseling: Secondary | ICD-10-CM

## 2020-09-24 DIAGNOSIS — Z89512 Acquired absence of left leg below knee: Secondary | ICD-10-CM

## 2020-09-24 DIAGNOSIS — D649 Anemia, unspecified: Secondary | ICD-10-CM

## 2020-09-24 DIAGNOSIS — D75839 Thrombocytosis, unspecified: Secondary | ICD-10-CM

## 2020-09-24 DIAGNOSIS — C7931 Secondary malignant neoplasm of brain: Secondary | ICD-10-CM

## 2020-09-24 DIAGNOSIS — D72829 Elevated white blood cell count, unspecified: Secondary | ICD-10-CM

## 2020-09-24 DIAGNOSIS — Z7984 Long term (current) use of oral hypoglycemic drugs: Secondary | ICD-10-CM

## 2020-09-24 DIAGNOSIS — G9389 Other specified disorders of brain: Secondary | ICD-10-CM

## 2020-09-24 DIAGNOSIS — Z87891 Personal history of nicotine dependence: Secondary | ICD-10-CM

## 2020-09-24 DIAGNOSIS — Z515 Encounter for palliative care: Secondary | ICD-10-CM

## 2020-09-24 DIAGNOSIS — I2782 Chronic pulmonary embolism: Secondary | ICD-10-CM

## 2020-09-24 DIAGNOSIS — I739 Peripheral vascular disease, unspecified: Secondary | ICD-10-CM

## 2020-09-24 DIAGNOSIS — Z66 Do not resuscitate: Secondary | ICD-10-CM

## 2020-09-24 DIAGNOSIS — E119 Type 2 diabetes mellitus without complications: Secondary | ICD-10-CM

## 2020-09-24 DIAGNOSIS — C3411 Malignant neoplasm of upper lobe, right bronchus or lung: Secondary | ICD-10-CM

## 2020-09-24 DIAGNOSIS — C3490 Malignant neoplasm of unspecified part of unspecified bronchus or lung: Secondary | ICD-10-CM

## 2020-09-24 DIAGNOSIS — S88112S Complete traumatic amputation at level between knee and ankle, left lower leg, sequela: Secondary | ICD-10-CM

## 2020-09-24 DIAGNOSIS — L97919 Non-pressure chronic ulcer of unspecified part of right lower leg with unspecified severity: Secondary | ICD-10-CM

## 2020-09-24 LAB — BASIC METABOLIC PANEL
Anion gap: 8 (ref 5–15)
BUN: 11 mg/dL (ref 8–23)
CO2: 27 mmol/L (ref 22–32)
Calcium: 7.9 mg/dL — ABNORMAL LOW (ref 8.9–10.3)
Chloride: 100 mmol/L (ref 98–111)
Creatinine, Ser: 0.62 mg/dL (ref 0.61–1.24)
GFR, Estimated: >60 mL/min (ref >60)
Glucose, Bld: 149 mg/dL — ABNORMAL HIGH (ref 70–99)
Potassium: 3.5 mmol/L (ref 3.5–5.1)
Sodium: 135 mmol/L (ref 135–145)

## 2020-09-24 LAB — CBC
HCT: 24.6 % — ABNORMAL LOW (ref 39.0–52.0)
Hemoglobin: 7.5 g/dL — ABNORMAL LOW (ref 13.0–17.0)
MCH: 24.4 pg — ABNORMAL LOW (ref 26.0–34.0)
MCHC: 30.5 g/dL (ref 30.0–36.0)
MCV: 79.9 fL — ABNORMAL LOW (ref 80.0–100.0)
Platelets: 522 10*3/uL — ABNORMAL HIGH (ref 150–400)
RBC: 3.08 MIL/uL — ABNORMAL LOW (ref 4.22–5.81)
RDW: 19.1 % — ABNORMAL HIGH (ref 11.5–15.5)
WBC: 20.6 10*3/uL — ABNORMAL HIGH (ref 4.0–10.5)
nRBC: 0.3 % — ABNORMAL HIGH (ref 0.0–0.2)

## 2020-09-24 LAB — GLUCOSE, CAPILLARY
Glucose-Capillary: 119 mg/dL — ABNORMAL HIGH (ref 70–99)
Glucose-Capillary: 201 mg/dL — ABNORMAL HIGH (ref 70–99)
Glucose-Capillary: 186 mg/dL — ABNORMAL HIGH (ref 70–99)
Glucose-Capillary: 165 mg/dL — ABNORMAL HIGH (ref 70–99)

## 2020-09-24 LAB — PROCALCITONIN: Procalcitonin: 0.52 ng/mL

## 2020-09-24 MED ORDER — SODIUM CHLORIDE 0.9 % IV SOLN
250.0000 mg | Freq: Once | INTRAVENOUS | Status: AC
  Administered 2020-09-24: 250 mg via INTRAVENOUS
  Filled 2020-09-24: qty 20

## 2020-09-24 MED ORDER — PANTOPRAZOLE SODIUM 40 MG PO TBEC
40.0000 mg | DELAYED_RELEASE_TABLET | Freq: Every day | ORAL | Status: DC
  Administered 2020-09-24 – 2020-10-03 (×10): 40 mg via ORAL
  Filled 2020-09-24 (×10): qty 1

## 2020-09-24 NOTE — Progress Notes (Signed)
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  Apparently, he had routine blood work that revealed a hemoglobin of 5.5.  He was directed to the emergency department for further evaluation.  Patient transferred to [facility] for worsening right lower extremity pain concerning for infection, history of left BKA, transferred to [facility] for vascular intervention/evaluation.   *OF NOTE: Previously prolonged hospitalization from 06/26/2020 to 07/14/2020 at which time the patient was diagnosed with adenocarcinoma of the right lung when he presented with a cavitary lung mass. During that hospitalization, the patient underwent stereotactic resection of a brain mass which was felt to be metastasis from his lung cancer.  In addition his hospitalization was prolonged because of the PE.  He was initially started on anticoagulation, but this was stopped secondary to the patient's hemoptysis and bleeding from his brain mass. Retrievable IVC filter was placed on 07/05/2020.  Further anticoagulation was deferred until he had followed up with pulmonology postdischarge.  During that hospitalization, the patient was also treated for postobstructive pneumonia. Unfortunately, he has had poor follow-up since his discharge from the hospital.  He has not yet followed up with medical oncology.  However the patient did undergo radiation to the brain from 08/25/2020 to 08/31/2020.   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 Trula Slade, who will consult after transfer -holding Plavix temporarily   Cellulitis right lower extremity -Continue cefazolin -Defer to vascular surgery given previous left BKA concern for vasculopathy which would limit patient's medical management of distal right cellulitis.  Acute on chronic anemia/symptomatic anemia POA -FOBT was positive -no hematochezia or melena despite being on PO iron -will ultimately need GI evaluation at some point - likely outpatient unless clinical status changes while admitted -Transfused 2 units PRBC at intake -Iron level profoundly diminished(5), IV iron x1 dose today transition back to p.o. iron thereafter -may increase frequency of p.o. 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 Montey Hora since his discharge from the hospital 07/14/20 -Status post XRT to the brain 08/25/2020-08/31/2020 -Continue Keppra for seizure prophylaxis   Pulmonary embolus -Status post IVC filter 07/05/2020 -He is not on anticoagulation secondary to hemoptysis ambulating from his brain mass     DVT prophylaxis: Holding given questionable bleed/symptomatic anemia as above Code Status: Full Family  Communication: None present  Status  is: Inpatient  Dispo: The patient is from: Home              Anticipated d/c is to: To be determined              Anticipated d/c date is: 48-72h              Patient currently not medically stable for discharge  Consultants:  Vascular surgery  Procedures:  None planned  Antimicrobials:  Cefazolin  Subjective: No acute issues or events overnight denies nausea vomiting diarrhea constipation headache fevers or chills.  Objective: Vitals:   09/23/20 2221 09/23/20 2226 09/24/20 0028 09/24/20 0418  BP: (!) 93/48  (!) 99/49 (!) 152/73  Pulse: (!) 104  (!) 101 (!) 107  Resp: 18 18 18 18   Temp: 98.1 F (36.7 C)  98.2 F (36.8 C) 98.4 F (36.9 C)  TempSrc: Oral  Oral Oral  SpO2: 98%  100% 97%  Weight:      Height:        Intake/Output Summary (Last 24 hours) at 09/24/2020 0747 Last data filed at 09/23/2020 2248 Gross per 24 hour  Intake 240 ml  Output 750 ml  Net -510 ml   Filed Weights   09/22/20 1159  Weight: 77.1 kg    Examination:  General:  Pleasantly resting in bed, No acute distress. HEENT:  Normocephalic atraumatic.  Sclerae nonicteric, noninjected.  Extraocular movements intact bilaterally. Neck:  Without mass or deformity.  Trachea is midline. Lungs:  Clear to auscultate bilaterally without rhonchi, wheeze, or rales. Heart:  Regular rate and rhythm.  Without murmurs, rubs, or gallops. Abdomen:  Soft, nontender, nondistended.  Without guarding or rebound. Skin/Extremities/Vascular: 2x2 cm non-infected superficial ulceration of medial left BKA incision. Multiple right lower leg ulcers with eschar distal to the knee. He has intact sensation and motor function of right foot.  Faint palpable pulses distally, femoral 2+   Data Reviewed: I have personally reviewed following labs and imaging studies  CBC: Recent Labs  Lab 09/21/20 1426 09/22/20 1301 09/22/20 2203 09/23/20 0638 09/23/20 2304 09/24/20 0052  WBC 13.8* 15.4*  --  21.5* 21.5* 20.6*  NEUTROABS   --  13.0*  --   --  18.9*  --   HGB 5.5* 5.6* 8.1* 8.0* 7.5* 7.5*  HCT 20.6* 20.2* 26.3* 25.9* 24.2* 24.6*  MCV 78.9* 78.9*  --  80.2 78.8* 79.9*  PLT 624* 667*  --  558* 522* 166*   Basic Metabolic Panel: Recent Labs  Lab 09/21/20 1426 09/22/20 1301 09/23/20 0638 09/24/20 0052  NA 140 136 138 135  K 4.8 3.9 3.6 3.5  CL 105 103 104 100  CO2 29 28 28 27   GLUCOSE 149* 156* 122* 149*  BUN 12 11 9 11   CREATININE 0.60* 0.65 0.52* 0.62  CALCIUM 8.9 8.3* 8.0* 7.9*   GFR: Estimated Creatinine Clearance: 76.1 mL/min (by C-G formula based on SCr of 0.62 mg/dL). Liver Function Tests: Recent Labs  Lab 09/21/20 1426  AST 5*  ALT 4*  BILITOT 0.2  PROT 6.2   No results for input(s): LIPASE, AMYLASE in the last 168 hours. No results for input(s): AMMONIA in the last 168 hours. Coagulation Profile: No results for input(s): INR, PROTIME in the last 168 hours. Cardiac Enzymes: No results for input(s): CKTOTAL, CKMB, CKMBINDEX, TROPONINI in the last 168 hours. BNP (last 3 results) No results for input(s): PROBNP in the last 8760 hours. HbA1C: Recent Labs  09/21/20 1426  HGBA1C 5.5   CBG: Recent Labs  Lab 09/23/20 0022 09/23/20 0712 09/23/20 1103 09/23/20 1618 09/23/20 2140  GLUCAP 129* 117* 160* 175* 160*   Lipid Profile: No results for input(s): CHOL, HDL, LDLCALC, TRIG, CHOLHDL, LDLDIRECT in the last 72 hours. Thyroid Function Tests: No results for input(s): TSH, T4TOTAL, FREET4, T3FREE, THYROIDAB in the last 72 hours. Anemia Panel: Recent Labs    09/23/20 0638  FERRITIN 67  TIBC 244*  IRON 5*  RETICCTPCT 2.5   Sepsis Labs: Recent Labs  Lab 09/22/20 1333 09/22/20 2203 09/23/20 0638 09/24/20 0052  PROCALCITON  --  <0.10 0.15 0.52  LATICACIDVEN 1.6  --   --   --     Recent Results (from the past 240 hour(s))  Culture, blood (routine x 2)     Status: None (Preliminary result)   Collection Time: 09/22/20 10:01 PM   Specimen: BLOOD  Result Value Ref  Range Status   Specimen Description BLOOD RIGHT ANTECUBITAL  Final   Special Requests   Final    BOTTLES DRAWN AEROBIC AND ANAEROBIC Blood Culture adequate volume   Culture   Final    NO GROWTH < 12 HOURS Performed at [facility], [address], [place], [place] [address]    Report Status PENDING  Incomplete  Culture, blood (routine x 2)     Status: None (Preliminary result)   Collection Time: 09/22/20 10:03 PM   Specimen: BLOOD  Result Value Ref Range Status   Specimen Description BLOOD LEFT ANTECUBITAL  Final   Special Requests   Final    BOTTLES DRAWN AEROBIC AND ANAEROBIC Blood Culture adequate volume   Culture   Final    NO GROWTH < 12 HOURS Performed at [facility], [address], [place], [place] [address]    Report Status PENDING  Incomplete         Radiology Studies: CT TIBIA FIBULA RIGHT W CONTRAST  Result Date: 09/22/2020 CLINICAL DATA:  80 year old male with right lower extremity ulcer posteriorly. History of metastatic lung cancer. EXAM: CT OF THE LOWER RIGHT EXTREMITY WITH CONTRAST TECHNIQUE: Multidetector CT imaging of the lower right extremity was performed according to the standard protocol following intravenous contrast administration. CONTRAST:  21mL OMNIPAQUE IOHEXOL 300 MG/ML  SOLN COMPARISON:  None. FINDINGS: Bones/Joint/Cartilage There is no acute fracture or dislocation. 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 There is moderate degenerative changes of the right knee. Ligaments Suboptimally assessed by CT. Muscles and Tendons No intramuscular fluid collection or hematoma. Soft tissues There is ulceration of the posterior skin over the distal lower extremity. There is thickening of the skin and diffuse subcutaneous edema. Subcutaneous air noted in the region of the ulceration. No drainable fluid collection or abscess identified. IMPRESSION: 1. No acute fracture or dislocation. 2. Ulceration of the posterior skin over the distal lower extremity. No drainable fluid collection or  abscess. Electronically Signed   By: Anner Crete M.D.   On: 09/22/2020 21:10    Scheduled Meds:  (feeding supplement) PROSource Plus  30 mL Oral TID BM   atorvastatin  10 mg Oral Daily   feeding supplement  237 mL Oral BID BM   insulin aspart  0-5 Units Subcutaneous QHS   insulin aspart  0-9 Units Subcutaneous TID WC   levETIRAcetam  500 mg Oral BID   multivitamin  1 tablet Oral Daily   pantoprazole (PROTONIX) IV  40 mg Intravenous Q24H   traZODone  50 mg Oral QHS   Continuous Infusions:  ceFAZolin (ANCEF) IV 2 g (09/24/20 0455)     LOS: 2 days   Time spent: 29min  Miquel Stacks C Alayzha An, DO [facility]  If 7PM-7AM, please contact night-coverage [email or web address]  09/24/2020, 7:47 AM

## 2020-09-24 NOTE — Progress Notes (Signed)
Pt asking about changing from DNR status to full code status. Holli Humbles, MD aware. See new order. Will continue to monitor.

## 2020-09-24 NOTE — Consult Note (Addendum)
Hospital Consult    Reason for Consult:  RLE ulcers Requesting Physician:  [facility] Dr. Tat MRN #:  [id number]  History of Present Illness: This is a 80 y.o. male presented to [facility] ED due to outpatient hgb on 5.5 mg/dL. Has history of recent PE in March full anticoagulation initiated but held due to hemoptysis and intracranial bleed. S/p right craniotomy and IVC filter. History of stage IV lung cancer with brain metastases.  He has been seen in the past by Dr. Carlis Abbott with non-healing wounds of the LLE and was found to have severe infrainguinal disease on the right as well. Last seen on 03/10/2019 in the office. He had no rest pain or new tissue loss of the right lower extremity. ABI at that time was stable at 0.52.  Arrangements were made for 6 month follow-up.  He is sitting up eating. Awake, alert in NAD. He is ambivalent when asked questions with non-specific answers. He stopped smoking several months ago. He doesn't ambulate regularly with his prosthesis. He endorses persistent right lower pain. Uses rubs at night for pain, does not dangle his leg for relief.  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 Ultimately left TMA, then BKA on 09/22/2018.  The pt is on a statin for cholesterol management.  The pt not on a daily aspirin.   Other AC:  Plavix The pt is on ACEI for hypertension.   The pt is diabetic.  Insulin (inpatient) metformin at home Tobacco hx:  quit; 22 PYH  Past Medical History:  Diagnosis Date   Diabetes mellitus without complication ([place])    Kidney stones    Lung cancer ([place]) 2022   PONV (postoperative nausea and vomiting)     Past Surgical History:  Procedure Laterality Date   ABDOMINAL AORTOGRAM W/LOWER EXTREMITY N/A 09/17/2018   Procedure: ABDOMINAL AORTOGRAM W/LOWER EXTREMITY;  Surgeon: Marty Heck, MD;  Location: [place] [facility];  Service: Cardiovascular;  Laterality: N/A;   AMPUTATION Left 09/22/2018   Procedure:  AMPUTATION BELOW KNEE LEFT;  Surgeon: Marty Heck, MD;  Location: [place];  Service: Vascular;  Laterality: Left;   AMPUTATION TOE Left 09/10/2018   Procedure: AMPUTATION OF THIRD TOE LEFT FOOT, DEBRIDEMENT OF ULCERS ON SECOND TOE AND PARTIAL AMPUTATION SECOND TOE ON LEFT FOOT;  Surgeon: Virl Cagey, MD;  Location: [facility];  Service: General;  Laterality: Left;   APPLICATION OF CRANIAL NAVIGATION N/A 07/08/2020   Procedure: APPLICATION OF CRANIAL NAVIGATION;  Surgeon: Karsten Ro, DO;  Location: [place];  Service: Neurosurgery;  Laterality: N/A;   APPLICATION OF WOUND VAC Left 09/18/2018   Procedure: APPLICATION OF WOUND VAC;  Surgeon: Marty Heck, MD;  Location: [place];  Service: Vascular;  Laterality: Left;   CRANIOTOMY Right 07/08/2020   Procedure: Right Occipital craniotomy for tumor resection with brainlab;  Surgeon: Dawley, Theodoro Doing, DO;  Location: [place];  Service: Neurosurgery;  Laterality: Right;   CYSTOSCOPY  05/08/2012   Procedure: CYSTOSCOPY;  Surgeon: Marissa Nestle, MD;  Location: [facility];  Service: Urology;  Laterality: N/A;  Evacuation of clots   IR IVC FILTER PLMT / S&I /IMG GUID/MOD SED  07/05/2020   KIDNEY SURGERY     sutures    PERIPHERAL VASCULAR BALLOON ANGIOPLASTY  09/17/2018   Procedure: PERIPHERAL VASCULAR BALLOON ANGIOPLASTY;  Surgeon: Marty Heck, MD;  Location: [place] [facility];  Service: Cardiovascular;;  LT SFA and AT   RADIOLOGY WITH  ANESTHESIA N/A 06/30/2020   Procedure: MRI WITH ANESTHESIA   BRAIN WITH AND WITHOUT CONTRAST;  Surgeon: Radiologist, Medication, MD;  Location: [place];  Service: Radiology;  Laterality: N/A;   SPLENECTOMY     TRANSMETATARSAL AMPUTATION Left 09/18/2018   Procedure: TRANSMETATARSAL AMPUTATION LEFT FOOT;  Surgeon: Marty Heck, MD;  Location: [place];  Service: Vascular;  Laterality: Left;   TRANSURETHRAL RESECTION OF PROSTATE  05/14/2012   Procedure: TRANSURETHRAL RESECTION OF THE PROSTATE (TURP);  Surgeon:  Marissa Nestle, MD;  Location: [facility];  Service: Urology;  Laterality: N/A;    No Known Allergies  Prior to Admission medications   Medication Sig Start Date End Date Taking? Authorizing Provider  acetaminophen (TYLENOL) 325 MG tablet Take 2 tablets (650 mg total) by mouth every 4 (four) hours as needed for headache or mild pain. Patient taking differently: Take 650 mg by mouth daily. 09/29/18  Yes Angiulli, Lavon Paganini, PA-C  atorvastatin (LIPITOR) 10 MG tablet Take 1 tablet (10 mg total) by mouth daily at 6 PM. 04/14/20  Yes Gosrani, Nimish C, MD  Cal Carb-Mag Hydrox-Simeth (ROLAIDS ADVANCED) 1000-200-40 MG CHEW Chew 1 tablet by mouth daily as needed (for indigestion).   Yes [provider]  clopidogrel (PLAVIX) 75 MG tablet Take 1 tablet (75 mg total) by mouth daily. 07/23/20  Yes Lavina Hamman, MD  ferrous sulfate 324 MG TBEC Take 324 mg by mouth daily with breakfast.   Yes [provider]  lisinopril (ZESTRIL) 5 MG tablet Take 1 tablet (5 mg total) by mouth daily. 04/14/20  Yes Gosrani, Nimish C, MD  loperamide (IMODIUM) 2 MG capsule Take 1 capsule (2 mg total) by mouth as needed for diarrhea or loose stools. 09/21/20  Yes Gosrani, Nimish C, MD  metFORMIN (GLUCOPHAGE) 500 MG tablet Take 1 tablet (500 mg total) by mouth 2 (two) times daily with a meal. 04/14/20  Yes Gosrani, Nimish C, MD  oxycodone (OXY-IR) 5 MG capsule Take 1 capsule (5 mg total) by mouth 2 (two) times daily as needed. 09/21/20  Yes Gosrani, Nimish C, MD  pantoprazole (PROTONIX) 40 MG tablet Take 1 tablet (40 mg total) by mouth daily at 6 (six) AM. 07/14/20  Yes Lavina Hamman, MD  saccharomyces boulardii (FLORASTOR) 250 MG capsule Take 1 capsule (250 mg total) by mouth 2 (two) times daily. 07/13/20  Yes Lavina Hamman, MD  traZODone (DESYREL) 50 MG tablet Take 1 tablet (50 mg total) by mouth at bedtime. 04/14/20  Yes Gosrani, Nimish C, MD  levETIRAcetam (KEPPRA) 500 MG tablet Take 1 tablet (500 mg total) by mouth 2  (two) times daily for 3 days. 07/12/20 09/21/20  Dawley, Troy C, DO  nicotine (NICODERM CQ) 14 mg/24hr patch Place 1 patch (14 mg total) onto the skin daily. Patient not taking: Reported on 09/22/2020 11/30/19   Doree Albee, MD    Social History   Socioeconomic History   Marital status: Legally Separated    Spouse name: Not on file   Number of children: Not on file   Years of education: Not on file   Highest education level: Not on file  Occupational History   Not on file  Tobacco Use   Smoking status: Former    Packs/day: 0.50    Years: 45.00    Pack years: 22.50    Types: Cigarettes   Smokeless tobacco: Never   Tobacco comments:    4 cigarettes per day  Vaping Use   Vaping Use:  Never used  Substance and Sexual Activity   Alcohol use: No   Drug use: No   Sexual activity: Not on file  Other Topics Concern   Not on file  Social History Narrative   Divorced since 2011.Lives with brother.Retired,previously maintenance work for [employer]   Social Determinants of Health   Financial Resource Strain: Low Risk    Difficulty of Paying Living Expenses: Not very hard  Food Insecurity: No Food Insecurity   Worried About Charity fundraiser in the Last Year: Never true   Arboriculturist in the Last Year: Never true  Transportation Needs: Public librarian (Medical): Yes   Lack of Transportation (Non-Medical): Yes  Physical Activity: Not on file  Stress: Not on file  Social Connections: Socially Isolated   Frequency of Communication with Friends and Family: More than three times a week   Frequency of Social Gatherings with Friends and Family: More than three times a week   Attends Religious Services: Never   Marine scientist or Organizations: No   Attends Music therapist: Never   Marital Status: Separated  Human resources officer Violence: Not At Risk   Fear of Current or Ex-Partner: No   Emotionally Abused: No    Physically Abused: No   Sexually Abused: No     Family History  Problem Relation Age of Onset   Diabetes Mother     ROS: [x]  Positive   [ ]  Negative   [ ]  All sytems reviewed and are negative  Cardiac: []  chest pain/pressure []  palpitations []  SOB lying flat []  DOE  Vascular: []  pain in legs while walking [x]  pain in legs at rest []  pain in legs at night [x]  non-healing ulcers []  hx of DVT []  swelling in legs  Pulmonary: []  productive cough []  asthma/wheezing []  home O2  Neurologic: []  weakness in []  arms []  legs []  numbness in []  arms []  legs []  hx of CVA []  mini stroke [] difficulty speaking or slurred speech []  temporary loss of vision in one eye []  dizziness  Hematologic: [x]  hx of cancer [x]  bleeding problems []  problems with blood clotting easily  Endocrine:   [x]  diabetes []  thyroid disease  GI []  vomiting blood []  blood in stool  GU: []  CKD/renal failure []  HD--[]  M/W/F or []  T/T/S []  burning with urination []  blood in urine  Psychiatric: []  anxiety []  depression  Musculoskeletal: []  arthritis []  joint pain  Integumentary: []  rashes []  ulcers  Constitutional: []  fever []  chills   Physical Examination  Vitals:   09/24/20 0418 09/24/20 0812  BP: (!) 152/73 (!) 127/57  Pulse: (!) 107 (!) 111  Resp: 18 17  Temp: 98.4 F (36.9 C) 98.2 F (36.8 C)  SpO2: 97% 97%   Body mass index is 25.1 kg/m.  General:  WDWN in NAD Gait: Not observed HENT: WNL, normocephalic Pulmonary: normal non-labored breathing, Cardiac: regular rhythm and rate Abdomen:  soft, NT/ND Skin: without rashes Vascular Exam/Pulses: 2+ femoral pulses bilaterally Extremities:  2x2 cm non-infected superficial ulceration of medial left BKA incision Multiple right lower leg ulcers with eschar. He has intact sensation and motor function of right foot. Foot is warm. No plapable pulses. No Doppler unit. available Musculoskeletal: positive muscle  wasting/atrophy  Neurologic: A&O X 3;  No focal weakness or paresthesias are detected; speech is fluent/normal Psychiatric:  The pt has Normal affect.       CBC    Component  Value Date/Time   WBC 20.6 (H) 09/24/2020 0052   RBC 3.08 (L) 09/24/2020 0052   HGB 7.5 (L) 09/24/2020 0052   HCT 24.6 (L) 09/24/2020 0052   PLT 522 (H) 09/24/2020 0052   MCV 79.9 (L) 09/24/2020 0052   MCH 24.4 (L) 09/24/2020 0052   MCHC 30.5 09/24/2020 0052   RDW 19.1 (H) 09/24/2020 0052   LYMPHSABS 0.9 09/23/2020 2304   MONOABS 1.6 (H) 09/23/2020 2304   EOSABS 0.0 09/23/2020 2304   BASOSABS 0.0 09/23/2020 2304    BMET    Component Value Date/Time   NA 135 09/24/2020 0052   K 3.5 09/24/2020 0052   CL 100 09/24/2020 0052   CO2 27 09/24/2020 0052   GLUCOSE 149 (H) 09/24/2020 0052   BUN 11 09/24/2020 0052   CREATININE 0.62 09/24/2020 0052   CREATININE 0.60 (L) 09/21/2020 1426   CALCIUM 7.9 (L) 09/24/2020 0052   GFRNONAA >60 09/24/2020 0052   GFRNONAA 96 09/21/2020 1426   GFRAA 112 09/21/2020 1426    COAGS: Lab Results  Component Value Date   INR 1.0 07/07/2020   INR 1.1 06/26/2020   INR 1.1 09/08/2018     Non-Invasive Vascular Imaging:   09/17/2018 Aortogram: "On the right which is the nonaffected extremity he did have a large posterior plaque in the distal external iliac common femoral artery.  The right profunda was widely patent but the SFA was diffusely diseased with a short focal chronic total occlusion and 50 to 70% stenosis throughout its course.  Above and below-knee popliteal artery were patent.  I could only identify a single tibial runoff via the anterior tibial but it was difficult to evaluate this in the mid to distal calf and into the foot given patient movement."   ASSESSMENT/PLAN: This is a 80 y.o. male history of PAD, DM now with stage IV lung cancer and brain mets. RLE ulcers. Known right SFA occlusion. Admitted with profound anemia and Plavix being held. ? GI evaluation.  Update ABIs and get right lower extremity arterial duplex.  Anemia s/p 2u PRBCs. Hgb today 7.5. Etiology unclear.  Leukocytosis, thrombocytosis, Tm 100.6  On cefazolin 2g q 8 hours  -Dr. Trula Slade, on-call vascular surgeon will evaluate.   Risa Grill, PA-C [facility] [phone]   I agree with the above.  I have seen and evaluated the patient.  Briefly this is a 80 year old gentleman with stage IV lung cancer with brain metastases.  He has undergone angiogram in the past that showed a right superficial femoral artery occlusion.  He has undergone left below-knee amputation.  He came in with anemia and was transfused.  He was also found to have a 1 month history of right leg ulcers.  I discussed 3 options with the patient.  The first would be proceeding with angiography to redefine his anatomy and possibly intervene on the right leg for limb salvage.  The second option would be proceeding to a right below-knee amputation.  The third option would be palliative care given his multiple underlying comorbidities.  The patient is going to contemplate his options and I will follow-up with him tomorrow.  Annamarie Major

## 2020-09-24 NOTE — Plan of Care (Signed)

## 2020-09-24 NOTE — Consult Note (Signed)
Consultation Note Date: 09/24/2020   Patient Name: Thomas Garcia  DOB: 1940/09/27  MRN: [id number]  Age / Sex: 80 y.o., male  PCP: Doree Albee, MD Referring Physician: Little Ishikawa, MD  Reason for Consultation: Establishing goals of care  HPI/Patient Profile: 80 y.o. male  with past medical history of non-small cell lung cancer metastatic to brain, pulmonary embolism, anemia, PAD status post left BKA, uncontrolled diabetes mellitus, HTN, and GERD. He was sent to the emergency department on 09/22/2020 from his PCP office with hemoglobin of 5.5. He reported weakness and pain from wounds on his right leg. ED Course:  Blood pressure systolic initially 91/47.  Improved with 517ml bolus. Leukocytosis of 15.4.  Hemoglobin 5.6.  2 units PRBC ordered for transfusion hospitalist admit for acute on chronic anemia.  Recent prolonged hospitalization-3/20 to 07/14/2020 for right upper lobe lung cancer, right occipital mass, pulmonary embolism.  He was started on full anticoagulation but this was held due to hemoptysis and bleeding from brain mass.  A retrievable IVC filter 3/29.  Further decision on anticoagulation was deferred until patient followed up with pulmonology postdischarge.    Clinical Assessment and Goals of Care: I have reviewed medical records including EPIC notes, labs and imaging, and met at bedside with patient to discuss diagnosis, prognosis, GOC, EOL wishes, disposition, and options. When I enter the room, he is sitting up in bed with the covers over his head.   I introduced Palliative Medicine as specialized medical care for people living with serious illness. It focuses on providing relief from the symptoms and stress of a serious illness.   We discussed a brief life review. He is retired from [employer] work. He is legally separated from his wife. He has 4 children - Carloyn Manner., Darci Current,  and [place]. I encouraged patient to think about appointing someone as his HCPOA in the event he becomes unable to make his own medical decisions - he is unable to name someone at this time.   He lives at his home in [place]. His brother has been living with him and helps take of him the the best of his ability. As far as functional status, patient has been non-ambulatory since his left BKA in June 2020. He reports poor appetite and poor oral intake over the past few months.    We discussed his current illness and what it means in the larger context of his ongoing co-morbidities.  I asked patient what he understood about his cancer diagnosis. He initially denies that he has cancer. Reviewed that unfortunately he has lung cancer with metastasis to the brain. Natural disease trajectory of metastatic cancer was discussed, emphasizing the onset of decline usually suggests worsening disease. Discussed that he is status post craniotomy in April followed by radiation therapy 5/17-6/10. Patient states he would be accepting of additional cancer treatment if offered, in order to prolong his life. I expressed concern that he may not be a good candidate for additional treatment due to his co-morbidities and poor functional  status.   The difference between full scope medical intervention and comfort care was considered.  I introduced the concept of a comfort path, emphasizing that this path involves stopping (or not starting) full scope medical interventions, allowing a natural course to occur. Discussed that the goal is comfort and dignity rather than cure/prolonging life. Encouraged patient to consider at what point he would want to stop full scope medical interventions, keeping in mind the concept of quality of life.   Provided education and counseling on the philosophy and benefits of hospice. Patient initially states he doesn't want to go to "one of those places" (referring to a hospice house) - states "that's  where people go to die".  Emphasized that hospice care can be provided in the home. Discussed that it offers a holistic approach to care in the setting of end-stage illness/disease, and can help a patient feel as good as possible for as long as possible. Discussed hospice can provide DME, personal care, support for the family, and help keep patient out of the hospital. He expresses interest in home hospice - stating "that's sounds pretty good". He wants to talk with his brother about it first.  Discussed that he would be eligible for hospice if he is not a candidate (or does not pursue) additional cancer treatment.   Briefly discussed code status and confirmed patient wish for DNR/DNI status. Discussed evidenced based poor outcomes in similar hospitalized patients, as the cause of the arrest is likely associated with chronic/terminal disease rather than a reversible acute cardio-pulmonary event. Patient has a durable DNR form on file in [place] from April 2022.   Primary decision maker: patient    SUMMARY OF RECOMMENDATIONS   Code status changed to DNR/DNI Continue current medical care Patient is still considering whether he wants to pursue right leg amputation  Patient is eligible for home hospice if he is not a candidate for additional cancer treatment (or does not wish to pursue additional treatment) Will continue to encourage patient to name an HCPOA PMT will continue to follow  Code Status/Advance Care Planning: DNR  Palliative Prophylaxis:  Oral Care  Additional Recommendations (Limitations, Scope, Preferences): Full Scope Treatment  Psycho-social/Spiritual:  Created space and opportunity for patient to express thoughts and feelings regarding patient's current medical situation.  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 The following aspects are pertinent.  Past Medical History:  Diagnosis Date   Diabetes mellitus without complication ([place])    Kidney stones    Lung cancer ([place]) 2022   PONV (postoperative nausea and vomiting)     Scheduled Meds:  (feeding supplement) PROSource Plus  30 mL Oral TID BM   atorvastatin  10 mg Oral Daily   feeding supplement  237 mL Oral BID BM   insulin aspart  0-5 Units Subcutaneous QHS   insulin aspart  0-9 Units Subcutaneous TID WC   levETIRAcetam  500 mg Oral BID   multivitamin  1 tablet Oral Daily   pantoprazole  40 mg Oral Daily  traZODone  50 mg Oral QHS   Continuous Infusions:   ceFAZolin (ANCEF) IV 2 g (09/24/20 1420)   ferric gluconate (FERRLECIT) IVPB     PRN Meds:.acetaminophen **OR** acetaminophen, morphine injection, ondansetron **OR** ondansetron (ZOFRAN) IV Medications Prior to Admission:  Prior to Admission medications   Medication Sig Start Date End Date Taking? Authorizing Provider  acetaminophen (TYLENOL) 325 MG tablet Take 2 tablets (650 mg total) by mouth every 4 (four) hours as needed for headache or mild pain. Patient taking differently: Take 650 mg by mouth daily. 09/29/18  Yes Angiulli, Lavon Paganini, PA-C  atorvastatin (LIPITOR) 10 MG tablet Take 1 tablet (10 mg total) by mouth daily at 6 PM. 04/14/20  Yes Gosrani, Nimish C, MD  Cal Carb-Mag Hydrox-Simeth (ROLAIDS ADVANCED) 1000-200-40 MG CHEW Chew 1 tablet by mouth daily as needed (for indigestion).   Yes [provider]  clopidogrel (PLAVIX) 75 MG tablet Take 1 tablet (75 mg total) by mouth daily. 07/23/20  Yes Lavina Hamman, MD  ferrous sulfate 324 MG TBEC Take 324 mg by mouth  daily with breakfast.   Yes [provider]  lisinopril (ZESTRIL) 5 MG tablet Take 1 tablet (5 mg total) by mouth daily. 04/14/20  Yes Gosrani, Nimish C, MD  loperamide (IMODIUM) 2 MG capsule Take 1 capsule (2 mg total) by mouth as needed for diarrhea or loose stools. 09/21/20  Yes Gosrani, Nimish C, MD  metFORMIN (GLUCOPHAGE) 500 MG tablet Take 1 tablet (500 mg total) by mouth 2 (two) times daily with a meal. 04/14/20  Yes Gosrani, Nimish C, MD  oxycodone (OXY-IR) 5 MG capsule Take 1 capsule (5 mg total) by mouth 2 (two) times daily as needed. 09/21/20  Yes Gosrani, Nimish C, MD  pantoprazole (PROTONIX) 40 MG tablet Take 1 tablet (40 mg total) by mouth daily at 6 (six) AM. 07/14/20  Yes Lavina Hamman, MD  saccharomyces boulardii (FLORASTOR) 250 MG capsule Take 1 capsule (250 mg total) by mouth 2 (two) times daily. 07/13/20  Yes Lavina Hamman, MD  traZODone (DESYREL) 50 MG tablet Take 1 tablet (50 mg total) by mouth at bedtime. 04/14/20  Yes Gosrani, Nimish C, MD  levETIRAcetam (KEPPRA) 500 MG tablet Take 1 tablet (500 mg total) by mouth 2 (two) times daily for 3 days. 07/12/20 09/21/20  Dawley, Troy C, DO  nicotine (NICODERM CQ) 14 mg/24hr patch Place 1 patch (14 mg total) onto the skin daily. Patient not taking: Reported on 09/22/2020 11/30/19   Doree Albee, MD   No Known Allergies Review of Systems  Gastrointestinal:  Positive for diarrhea.  Neurological:  Positive for weakness.   Physical Exam Constitutional:      General: He is not in acute distress.    Appearance: He is ill-appearing.  Cardiovascular:     Rate and Rhythm: Tachycardia present.  Pulmonary:     Effort: Pulmonary effort is normal.  Musculoskeletal:     Left Lower Extremity: Left leg is amputated below knee.  Skin:    Comments: Right leg with multiple ulcers  Neurological:     Mental Status: He is alert and oriented to person, place, and time.  Psychiatric:        Mood and Affect: Affect is flat.        Speech:  Speech is delayed.    Vital Signs: BP (!) 127/57 (BP Location: Left Arm)   Pulse (!) 111   Temp 98.2 F (36.8 C) (Oral)   Resp 17  Ht '5\' 9"'$  (1.753 m)   Wt 77.1 kg   SpO2 97%   BMI 25.10 kg/m  Pain Scale: 0-10 POSS *See Group Information*: 1-Acceptable,Awake and alert Pain Score: 2    SpO2: SpO2: 97 % O2 Device:SpO2: 97 % O2 Flow Rate: .O2 Flow Rate (L/min): 0 L/min  IO: Intake/output summary:  Intake/Output Summary (Last 24 hours) at 09/24/2020 1442 Last data filed at 09/23/2020 2248 Gross per 24 hour  Intake --  Output 100 ml  Net -100 ml    LBM: Last BM Date:  (patient states "about 2 weeks') Baseline Weight: Weight: 77.1 kg Most recent weight: Weight: 77.1 kg      Palliative Assessment/Data: PPS 30%    Time In: 1545 Time Out: 1655 Time Total: 70 minutes Greater than 50%  of this time was spent counseling and coordinating care related to the above assessment and plan.  Signed by: [name], NP   Please contact Palliative Medicine Team phone at [phone] for questions and concerns.  For individual provider: See Shea Evans

## 2020-09-25 ENCOUNTER — Inpatient Hospital Stay (HOSPITAL_COMMUNITY): Payer: Medicare HMO

## 2020-09-25 DIAGNOSIS — L0390 Cellulitis, unspecified: Secondary | ICD-10-CM

## 2020-09-25 DIAGNOSIS — Z7902 Long term (current) use of antithrombotics/antiplatelets: Secondary | ICD-10-CM

## 2020-09-25 DIAGNOSIS — I739 Peripheral vascular disease, unspecified: Secondary | ICD-10-CM | POA: Diagnosis not present

## 2020-09-25 DIAGNOSIS — D509 Iron deficiency anemia, unspecified: Secondary | ICD-10-CM

## 2020-09-25 LAB — GLUCOSE, CAPILLARY
Glucose-Capillary: 128 mg/dL — ABNORMAL HIGH (ref 70–99)
Glucose-Capillary: 159 mg/dL — ABNORMAL HIGH (ref 70–99)
Glucose-Capillary: 167 mg/dL — ABNORMAL HIGH (ref 70–99)
Glucose-Capillary: 113 mg/dL — ABNORMAL HIGH (ref 70–99)

## 2020-09-25 LAB — SARS CORONAVIRUS 2 (TAT 6-24 HRS): SARS Coronavirus 2: POSITIVE — AB

## 2020-09-25 NOTE — Plan of Care (Signed)

## 2020-09-25 NOTE — Progress Notes (Signed)
ABI exam has been completed.  Results can be found under chart review under CV PROC. 09/25/2020 5:09 PM Trishna Cwik RVT, RDMS
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
## 2020-09-25 NOTE — Progress Notes (Signed)
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  Apparently, he had routine blood work that revealed a hemoglobin of 5.5.  He was directed to the emergency department for further evaluation.  Patient transferred to [facility] for worsening right lower extremity pain concerning for infection, history of left BKA, transferred to [facility] for vascular intervention/evaluation.   *OF NOTE: Previously prolonged hospitalization from 06/26/2020 to 07/14/2020 at which time the patient was diagnosed with adenocarcinoma of the right lung when he presented with a cavitary lung mass. During that hospitalization, the patient underwent stereotactic resection of a brain mass which was felt to be metastasis from his lung cancer.  In addition his hospitalization was prolonged because of the PE.  He was initially started on anticoagulation, but this was stopped secondary to the patient's hemoptysis and bleeding from his brain mass. Retrievable IVC filter was placed on 07/05/2020.  Further anticoagulation was deferred until he had followed up with pulmonology postdischarge.  During that hospitalization, the patient was also treated for postobstructive pneumonia. Unfortunately, he has had poor follow-up since his discharge from the hospital.  He has not yet followed up with medical oncology.  However the patient did undergo radiation to the brain from 08/25/2020 to 08/31/2020.    Subjective: The patient was seen and examined this morning, complaining of extreme pain and discomfort with any movement or touch in the right lower extremity. Mentation intact, awake alert oriented x4,  confirmed full CODE STATUS, stating if vascular would agree he would like to proceed with amputation.  Stating his lung cancer is under treatment, he had a brain surgery which he thinks most of his brain lesion was extracted.   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 Trula Slade, who will consult after transfer -holding Plavix temporarily due to anemia  -Vascular obtaining ABI right lower extremity arterial duplex   Cellulitis right lower extremity -Continue cefazolin -Defer to vascular surgery given previous left BKA concern for vasculopathy which would limit patient's medical management of distal right cellulitis.  Acute on chronic anemia/symptomatic anemia POA -FOBT was positive -no hematochezia or melena despite being on PO iron -Hemoglobin 5.5 >>> 8.0, 7.5 -Transfused 2 units PRBC on 6/17 -Iron level profoundly diminished(5), IV iron x1 dose 6/18 transition back to p.o. iron thereafter -may increase frequency of p.o. 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 Montey Hora since his discharge from the hospital 07/14/20 -Status post XRT to the brain 08/25/2020-08/31/2020 -Continue Keppra for seizure prophylaxis   Pulmonary embolus -Status post IVC filter 07/05/2020 -He is not on anticoagulation secondary to hemoptysis ambulating from his brain mass     DVT prophylaxis: Holding given questionable bleed/symptomatic anemia as above Code Status: Full Family Communication: None present  Status is: Inpatient  Dispo: The patient is from: Home              Anticipated d/c is to: To be determined              Anticipated d/c date is: 48-72h              Patient currently not medically stable for discharge  Consultants:  Vascular surgery  Procedures:  None planned  Antimicrobials:  Cefazolin   Objective: Vitals:   09/24/20 0812 09/24/20 1400 09/24/20 2040 09/25/20 0457  BP: (!) 127/57 132/60 (!) 124/56 (!) 159/89  Pulse: (!) 111 98 (!) 110 (!) 103  Resp: 17 18 18 19   Temp: 98.2 F (36.8 C) 98.5 F (36.9 C) 98.2 F (36.8 C) 98.6 F (37 C)  TempSrc: Oral Oral Oral Oral  SpO2: 97% 97% 96% 99%  Weight:      Height:        Intake/Output Summary (Last 24 hours) at 09/25/2020 1028 Last data filed at 09/25/2020 0800 Gross per 24 hour  Intake 500 ml  Output --  Net 500 ml   Filed Weights   09/22/20 1159  Weight: 77.1 kg    Examination:     Physical Exam:   General:  Alert, oriented, cooperative, no distress;   HEENT:  Normocephalic, PERRL, otherwise with in Normal limits   Neuro:  CNII-XII intact. , normal motor and sensation, reflexes intact   Lungs:   Clear to auscultation BL, Respirations unlabored, no wheezes / crackles  Cardio:    S1/S2, RRR, No murmure, No Rubs or Gallops   Abdomen:   Soft, non-tender, bowel sounds active all four quadrants,  no guarding or peritoneal signs.  Muscular skeletal:  Limited exam - in bed, L BKA, Rt lower ext ulcers, dressing, poor pulse moving all 4 extremities in  bed  Skin:  Dry, warm to touch, 2x2 cm non-infected superficial ulceration of medial left BKA incision. Multiple right lower leg ulcers with eschar distal to the knee. He has intact sensation and motor function of right foot.  Wounds: Please see nursing documentation  Pressure Injury 09/23/20 Buttocks Right;Left;Mid Stage 1 -  Intact skin with non-blanchable redness of a localized area usually over a bony prominence. 8 X 6 CM redness to sacrum (Active)  09/23/20 0030  Location: Buttocks  Location Orientation: Right;Left;Mid  Staging: Stage 1 -  Intact skin with non-blanchable redness of a localized area usually over a bony prominence.  Wound Description (Comments): 8 X 6 CM redness to sacrum  Present on Admission: Yes               Data Reviewed: I have personally reviewed following labs and imaging studies  CBC: Recent Labs  Lab 09/21/20 1426 09/22/20 1301 09/22/20 2203 09/23/20 0638 09/23/20 2304 09/24/20 0052  WBC 13.8* 15.4*  --  21.5* 21.5* 20.6*  NEUTROABS  --  13.0*  --   --  18.9*  --   HGB 5.5* 5.6*  8.1* 8.0* 7.5* 7.5*  HCT 20.6* 20.2* 26.3* 25.9* 24.2* 24.6*  MCV 78.9* 78.9*  --  80.2 78.8* 79.9*  PLT 624* 667*  --  558* 522* 428*   Basic Metabolic Panel: Recent Labs  Lab 09/21/20 1426 09/22/20 1301 09/23/20 0638 09/24/20 0052  NA 140 136 138 135  K 4.8 3.9 3.6 3.5  CL 105 103 104 100  CO2 29 28 28 27   GLUCOSE 149* 156* 122* 149*  BUN 12 11 9 11   CREATININE 0.60* 0.65 0.52* 0.62  CALCIUM 8.9 8.3* 8.0* 7.9*   GFR: Estimated Creatinine Clearance: 76.1 mL/min (by C-G formula based on SCr of 0.62 mg/dL). Liver Function Tests: Recent Labs  Lab 09/21/20 1426  AST 5*  ALT 4*  BILITOT 0.2  PROT 6.2    CBG: Recent Labs  Lab 09/24/20 0808 09/24/20 1241 09/24/20 1717 09/24/20 2037 09/25/20 0815  GLUCAP 119* 201* 186* 165* 128*   Lipid Profile: No results for input(s): CHOL, HDL, LDLCALC, TRIG, CHOLHDL, LDLDIRECT in the last 72  hours. Thyroid Function Tests: No results for input(s): TSH, T4TOTAL, FREET4, T3FREE, THYROIDAB in the last 72 hours. Anemia Panel: Recent Labs    09/23/20 0638  FERRITIN 67  TIBC 244*  IRON 5*  RETICCTPCT 2.5   Sepsis Labs: Recent Labs  Lab 09/22/20 1333 09/22/20 2203 09/23/20 0638 09/24/20 0052  PROCALCITON  --  <0.10 0.15 0.52  LATICACIDVEN 1.6  --   --   --     Recent Results (from the past 240 hour(s))  Culture, blood (routine x 2)     Status: None (Preliminary result)   Collection Time: 09/22/20 10:01 PM   Specimen: BLOOD  Result Value Ref Range Status   Specimen Description BLOOD RIGHT ANTECUBITAL  Final   Special Requests   Final    BOTTLES DRAWN AEROBIC AND ANAEROBIC Blood Culture adequate volume   Culture   Final    NO GROWTH 2 DAYS Performed at [facility], [address] [place], [place] [address]    Report Status PENDING  Incomplete  Culture, blood (routine x 2)     Status: None (Preliminary result)   Collection Time: 09/22/20 10:03 PM   Specimen: BLOOD  Result Value Ref Range Status   Specimen Description BLOOD LEFT ANTECUBITAL  Final   Special Requests   Final    BOTTLES DRAWN AEROBIC AND ANAEROBIC Blood Culture adequate volume   Culture   Final    NO GROWTH 2 DAYS Performed at [facility], [address] [place], [place] [address]    Report Status PENDING  Incomplete  SARS CORONAVIRUS 2 (TAT 6-24 HRS) Nasopharyngeal Nasopharyngeal Swab     Status: Abnormal   Collection Time: 09/24/20  8:37 AM   Specimen: Nasopharyngeal Swab  Result Value Ref Range Status   SARS Coronavirus 2 POSITIVE (A) NEGATIVE Final    Comment: (NOTE) SARS-CoV-2 target nucleic acids are DETECTED.  The SARS-CoV-2 RNA is generally detectable in upper and lower respiratory specimens during the acute phase of infection. Positive results are indicative of the presence of SARS-CoV-2 RNA. Clinical correlation with patient history and other diagnostic information is  necessary  to determine patient infection status. Positive results do not rule out bacterial infection or co-infection with other viruses.  The expected result is Negative.  Fact Sheet for Patients: SugarRoll.be  Fact Sheet for Healthcare Providers: https://www.woods-mathews.com/  This test is not yet approved or cleared by the [place] FDA and  has been authorized for  detection and/or diagnosis of SARS-CoV-2 by FDA under an Emergency Use Authorization (EUA). This EUA will remain  in effect (meaning this test can be used) for the duration of the COVID-19 declaration under Section 564(b)(1) of the Act, 21 U. S.C. section 360bbb-3(b)(1), unless the authorization is terminated or revoked sooner.   Performed at [facility], [place] [address], [place], [place] [address]          Radiology Studies: No results found.  Scheduled Meds:  (feeding supplement) PROSource Plus  30 mL Oral TID BM   atorvastatin  10 mg Oral Daily   feeding supplement  237 mL Oral BID BM   insulin aspart  0-5 Units Subcutaneous QHS   insulin aspart  0-9 Units Subcutaneous TID WC   levETIRAcetam  500 mg Oral BID   multivitamin  1 tablet Oral Daily   pantoprazole  40 mg Oral Daily   traZODone  50 mg Oral QHS   Continuous Infusions:   ceFAZolin (ANCEF) IV 2 g (09/25/20 0505)     LOS: 3 days   Time spent: 43min  Deatra James, MD [facility]  If 7PM-7AM, please contact night-coverage [email or web address]  09/25/2020, 10:28 AM

## 2020-09-26 DIAGNOSIS — I739 Peripheral vascular disease, unspecified: Secondary | ICD-10-CM

## 2020-09-26 DIAGNOSIS — D649 Anemia, unspecified: Secondary | ICD-10-CM

## 2020-09-26 DIAGNOSIS — R195 Other fecal abnormalities: Secondary | ICD-10-CM | POA: Diagnosis present

## 2020-09-26 LAB — BASIC METABOLIC PANEL
Anion gap: 6 (ref 5–15)
BUN: 11 mg/dL (ref 8–23)
CO2: 27 mmol/L (ref 22–32)
Calcium: 7.8 mg/dL — ABNORMAL LOW (ref 8.9–10.3)
Chloride: 101 mmol/L (ref 98–111)
Creatinine, Ser: 0.56 mg/dL — ABNORMAL LOW (ref 0.61–1.24)
GFR, Estimated: >60 mL/min (ref >60)
Glucose, Bld: 209 mg/dL — ABNORMAL HIGH (ref 70–99)
Potassium: 3.9 mmol/L (ref 3.5–5.1)
Sodium: 134 mmol/L — ABNORMAL LOW (ref 135–145)

## 2020-09-26 LAB — CBC
HCT: 22.3 % — ABNORMAL LOW (ref 39.0–52.0)
HCT: 25.7 % — ABNORMAL LOW (ref 39.0–52.0)
Hemoglobin: 6.8 g/dL — CL (ref 13.0–17.0)
Hemoglobin: 8.1 g/dL — ABNORMAL LOW (ref 13.0–17.0)
MCH: 24.2 pg — ABNORMAL LOW (ref 26.0–34.0)
MCH: 25.2 pg — ABNORMAL LOW (ref 26.0–34.0)
MCHC: 30.5 g/dL (ref 30.0–36.0)
MCHC: 31.5 g/dL (ref 30.0–36.0)
MCV: 79.4 fL — ABNORMAL LOW (ref 80.0–100.0)
MCV: 79.8 fL — ABNORMAL LOW (ref 80.0–100.0)
Platelets: 519 10*3/uL — ABNORMAL HIGH (ref 150–400)
Platelets: 506 10*3/uL — ABNORMAL HIGH (ref 150–400)
RBC: 2.81 MIL/uL — ABNORMAL LOW (ref 4.22–5.81)
RBC: 3.22 MIL/uL — ABNORMAL LOW (ref 4.22–5.81)
RDW: 20.1 % — ABNORMAL HIGH (ref 11.5–15.5)
RDW: 18.6 % — ABNORMAL HIGH (ref 11.5–15.5)
WBC: 15.8 10*3/uL — ABNORMAL HIGH (ref 4.0–10.5)
WBC: 14.9 10*3/uL — ABNORMAL HIGH (ref 4.0–10.5)
nRBC: 0.3 % — ABNORMAL HIGH (ref 0.0–0.2)
nRBC: 0.1 % (ref 0.0–0.2)

## 2020-09-26 LAB — GLUCOSE, CAPILLARY
Glucose-Capillary: 113 mg/dL — ABNORMAL HIGH (ref 70–99)
Glucose-Capillary: 170 mg/dL — ABNORMAL HIGH (ref 70–99)
Glucose-Capillary: 183 mg/dL — ABNORMAL HIGH (ref 70–99)
Glucose-Capillary: 140 mg/dL — ABNORMAL HIGH (ref 70–99)

## 2020-09-26 LAB — PREPARE RBC (CROSSMATCH)

## 2020-09-26 MED ORDER — SODIUM CHLORIDE 0.9% IV SOLUTION: Freq: Once | INTRAVENOUS | Status: AC

## 2020-09-26 NOTE — Progress Notes (Signed)
Started BT at [facility] with 1 unit RBC with unit number [id number], A positive, expiry date 10/01/2020 at 2358 H, blood verified by CN Nellie, please see BT activities in BT flow sheets

## 2020-09-26 NOTE — Progress Notes (Signed)
PT Cancellation Note  Patient Details Name: Thomas Garcia MRN: [id number] DOB: 11-01-1940   Cancelled Treatment:    Reason Eval/Treat Not Completed: Patient declined, no reason specified.  Pt refusing to mobilize with therapy today, however, we did discuss his wished related to d/c plan and he would like HHPT through [facility] (which he has used in the past) with a PT named Ben.  He may or may not agree to work with PT in the hospital, but was at least agreeable for PT to check back the day after his surgery (he reports surgery is on Wed June 22)  Thanks,  Verdene Lennert, PT, DPT  Acute Rehabilitation Ortho Tech Supervisor [phone] pager #[phone] office      [name] [name] 09/26/2020, 5:49 PM

## 2020-09-26 NOTE — Progress Notes (Addendum)
Referred pt to NP [facility] for hgb=6.8, made order to BT 1 unit PRBC properly typed and x matched and to do H and H 1 hour post BT, lab order to be enter by the nurse with appropriate time

## 2020-09-26 NOTE — Progress Notes (Signed)
OT Cancellation Note  Patient Details Name: Thomas Garcia MRN: [id number] DOB: 1940/09/12   Cancelled Treatment:    Reason Eval/Treat Not Completed: Fatigue/lethargy limiting ability to participate. Pt sleeping very heavily x 2 attempts by OT today. OT will follow up next available time  Britt Bottom 09/26/2020, 3:20 PM

## 2020-09-26 NOTE — Progress Notes (Signed)
[facility]  Subjective  -states he has decided on right below-knee amputation   Objective (!) 135/55 99 99 F (37.2 C) (Axillary) 18 97%  Intake/Output Summary (Last 24 hours) at 09/26/2020 0936 Last data filed at 09/26/2020 0700 Gross per 24 hour  Intake 928.75 ml  Output 300 ml  Net 628.75 ml         Laboratory Lab Results: Recent Labs    09/26/20 0114 09/26/20 0737  WBC 15.8* 14.9*  HGB 6.8* 8.1*  HCT 22.3* 25.7*  PLT 519* 506*   BMET Recent Labs    09/24/20 0052 09/26/20 0114  NA 135 134*  K 3.5 3.9  CL 100 101  CO2 27 27  GLUCOSE 149* 209*  BUN 11 11  CREATININE 0.62 0.56*  CALCIUM 7.9* 7.8*    COAG Lab Results  Component Value Date   INR 1.0 07/07/2020   INR 1.1 06/26/2020   INR 1.1 09/08/2018   No results found for: PTT  Assessment/Planning:  80 year old male seen in consultation over the weekend by Dr. 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  Initially admitted with GIB and transferred from [facility].  He was offered arteriogram with intervention versus below-knee amputation versus palliative care.  Ultimately states he has decided on right below-knee amputation.  I agree with his decision given I highly doubt the limb would be salvageable even with revascularization given the extent of tissue loss.  I have posted him for Wednesday for right below-knee amputation with me.  Marty Heck 09/26/2020 9:36 AM --

## 2020-09-26 NOTE — Progress Notes (Signed)
Daily Progress Note   Patient Name: Thomas Garcia       Date: 09/26/2020 DOB: 24-Nov-1940  Age: 80 y.o. MRN#: [id number] Attending Physician: Thomas Garcia, * Primary Care Physician: Thomas Albee, MD Admit Date: 09/22/2020  Reason for Consultation/Follow-up: Establishing goals of care  Subjective: With patient's permission, I spoke with his brother Thomas Garcia by phone for approximately 25 minutes. Thomas Garcia is able to verbalize that patient's current medical problems include lung cancer, brain mass, anemia, and sores on his right leg. He is aware that patient has decided to proceed with right leg amputation and surgery is scheduled for 6/22.   Thomas Garcia confirms that he lives with patient at his home in [place]. He reports patient is mostly in the bed, but is able transfer to the wheelchair mostly independently. In general, Thomas Garcia states "he does more on his own than requires help".   I briefly summarized my discussion with patient yesterday at bedside. I expressed concern that patient initially stated to me that "he does not have cancer". Thomas Garcia states that patient recently told him "there were no more dark spots on his lungs". Discussed that while he may be hopeful for this outcome, at this point his response to treatment is unknown. He only recently completed radiation treatment 6/10 and will need follow-up imaging to assess for response to treatment.   Thomas Garcia inquires about patient's code status - asking if DNR status can be changed. Thomas Garcia states that patient's son Thomas Garcia.) made that decision in April for his father to be DNR. Thomas Garcia states that patient has verbalized to him that he would want to be resuscitated.  I provided education on evidenced based poor outcomes in similar  hospitalized patients, as the cause of the arrest is likely associated with chronic/terminal disease rather than a reversible acute cardio-pulmonary event. Recommended that Thomas Garcia and I meet with patient at bedside tomorrow to further discuss.   Length of Stay: 4     Palliative Assessment/Data: PPS 30%      Palliative Care Assessment & Plan   HPI/Patient Profile: 80 y.o. male  with past medical history of non-small cell lung cancer metastatic to brain, pulmonary embolism, anemia, PAD status post left BKA, uncontrolled diabetes mellitus, HTN, and GERD. He was sent to the emergency department on 09/22/2020 from his PCP office  with hemoglobin of 5.5. He reported weakness and pain from wounds on his right leg. ED Course:  Blood pressure systolic initially 24/58.  Improved with 546ml bolus. Leukocytosis of 15.4.  Hemoglobin 5.6.  2 units PRBC ordered for transfusion hospitalist admit for acute on chronic anemia.   Recent prolonged hospitalization-3/20 to 07/14/2020 for right upper lobe lung cancer, right occipital mass, pulmonary embolism.  He was started on full anticoagulation but this was held due to hemoptysis and bleeding from brain mass.  A retrievable IVC filter 3/29.  Further decision on anticoagulation was deferred until patient followed up with pulmonology postdischarge.    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, s/p craniotomy and radiation - acute on chronic anemia - critical ischemia of right foot - recent PE - incidental COVID  Recommendations/Plan: Patient has decided to proceed with right leg amputation Will continue to encourage patient to name an HCPOA I am meeting with brother and patient tomorrow at bedside to clarify code status  Goals of Care and Additional Recommendations: Limitations on Scope of Treatment: Full Scope Treatment  Code Status: DNR   Prognosis:  < 6 months  Discharge Planning: To Be Determined  Care plan was discussed  with Dr. Loleta Garcia  Thank you for allowing the Palliative Medicine Team to assist in the care of this patient.   Total Time 40 minutes Prolonged Time Billed  no       Greater than 50%  of this time was spent counseling and coordinating care related to the above assessment and plan.  Thomas Bullion, NP  Please contact Palliative Medicine Team phone at [phone] for questions and concerns.

## 2020-09-26 NOTE — Progress Notes (Signed)
BT consumed and terminated no BT reaction was noted, pt still hemodynamically stable, no complain of SOB nor chest pain still on room air

## 2020-09-26 NOTE — Progress Notes (Signed)
[facility] PROGRESS NOTE    EUSTACE HUR  OFB:[id number] DOB: 08/03/1940 DOA: 09/22/2020 PCP: Doree Albee, MD      Brief Narrative:  Mr. Judson is a 80 y.o. 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  Please see longer progress note by Dr. Roger Shelter from 6/19.     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   Difficult to place patient No       Level of care: Med-Surg       MDM: The below labs and imaging reports were reviewed and summarized above.  Medication management as above.  Severe illness with threat to limb    DVT prophylaxis: SCDs Start: 09/22/20 2148  Code Status: DNR Family Communication: brother at  bedside           Subjective: Has a lot of pain in his right leg.  No fever, confusion, cough, dyspnea.  Objective: Vitals:   09/26/20 0600 09/26/20 0635 09/26/20 0700 09/26/20 1400  BP: (!) 142/61 (!) 142/63 (!) 135/55 134/71  Pulse: 99 97 99 100  Resp: 18 18 18 17   Temp: 99 F (37.2 C) 98.4 F (36.9 C) 99 F (37.2 C) 98.1 F (36.7 C)  TempSrc: Axillary Axillary Axillary Oral  SpO2: 97% 97% 97% 100%  Weight:      Height:        Intake/Output Summary (Last 24 hours) at 09/26/2020 1807 Last data filed at 09/26/2020 1500 Gross per 24 hour  Intake 1972.08 ml  Output 700 ml  Net 1272.08 ml   Filed Weights   09/22/20 1159  Weight: 77.1 kg    Examination: General appearance:  adult male, alert and in moderate distress from pain.   HEENT: Anicteric, conjunctiva pink, lids and lashes normal. No nasal deformity, discharge, epistaxis.  Lips moist.   Skin: Warm and dry.  no jaundice.  No suspicious rashes or lesions.  There are some redness around his right foot ulcers Cardiac: RRR, nl S1-S2, no murmurs appreciated.  Capillary refill is brisk.  JVP normal.  No LE edema.  Radial  pulses 2+ and symmetric. Respiratory: Normal respiratory rate and rhythm.  CTAB without rales or wheezes. Abdomen:  Abdomen soft.  no TTP. No ascites, distension, hepatosplenomegaly.   MSK: No deformities or effusions. Neuro: Awake and alert.  EOMI, moves all extremities. Speech fluent.    Psych: Sensorium intact and responding to questions, attention normal. Affect in pain.  Judgment and insight appear normal.    Data Reviewed: I have personally reviewed following labs and imaging studies:  CBC: Recent Labs  Lab 09/22/20 1301 09/22/20 2203 09/23/20 0638 09/23/20 2304 09/24/20 0052 09/26/20 0114 09/26/20 0737  WBC 15.4*  --  21.5* 21.5* 20.6* 15.8* 14.9*  NEUTROABS 13.0*  --   --  18.9*  --   --   --   HGB 5.6*   < > 8.0* 7.5* 7.5* 6.8* 8.1*  HCT 20.2*   < > 25.9* 24.2* 24.6* 22.3*  25.7*  MCV 78.9*  --  80.2 78.8* 79.9* 79.4* 79.8*  PLT 667*  --  558* 522* 522* 519* 506*   < > = values in this interval not displayed.   Basic Metabolic Panel: Recent Labs  Lab 09/21/20 1426 09/22/20 1301 09/23/20 0638 09/24/20 0052 09/26/20 0114  NA 140 136 138 135 134*  K 4.8 3.9 3.6 3.5 3.9  CL 105 103 104 100 101  CO2 29 28 28 27 27   GLUCOSE 716* 156* 122* 149* 209*  BUN 12 11 9 11 11   CREATININE 0.60* 0.65 0.52* 0.62 0.56*  CALCIUM 8.9 8.3* 8.0* 7.9* 7.8*   GFR: Estimated Creatinine Clearance: 76.1 mL/min (A) (by C-G formula based on SCr of 0.56 mg/dL (L)). Liver Function Tests: Recent Labs  Lab 09/21/20 1426  AST 5*  ALT 4*  BILITOT 0.2  PROT 6.2   No results for input(s): LIPASE, AMYLASE in the last 168 hours. No results for input(s): AMMONIA in the last 168 hours. Coagulation Profile: No results for input(s): INR, PROTIME in the last 168 hours. Cardiac Enzymes: No results for input(s): CKTOTAL, CKMB, CKMBINDEX, TROPONINI in the last 168 hours. BNP (last 3 results) No results for input(s): PROBNP in the last 8760 hours. HbA1C: No results for input(s): HGBA1C in the last 72 hours. CBG: Recent Labs  Lab 09/25/20 1725 09/25/20 2208 09/26/20 0803 09/26/20 1200 09/26/20 1711  GLUCAP 167* 113* 113* 170* 183*   Lipid Profile: No results for input(s): CHOL, HDL, LDLCALC, TRIG, CHOLHDL, LDLDIRECT in the last 72 hours. Thyroid Function Tests: No results for input(s): TSH, T4TOTAL, FREET4, T3FREE, THYROIDAB in the last 72 hours. Anemia Panel: No results for input(s): VITAMINB12, FOLATE, FERRITIN, TIBC, IRON, RETICCTPCT in the last 72 hours. Urine analysis:    Component Value Date/Time   COLORURINE YELLOW 06/27/2020 0615   APPEARANCEUR CLEAR 06/27/2020 0615   LABSPEC 1.016 06/27/2020 0615   PHURINE 5.0 06/27/2020 0615   GLUCOSEU 50 (A) 06/27/2020 0615   HGBUR NEGATIVE 06/27/2020 0615   HGBUR trace-intact 10/26/2008 0000   BILIRUBINUR NEGATIVE  06/27/2020 0615   KETONESUR 5 (A) 06/27/2020 0615   PROTEINUR 100 (A) 06/27/2020 0615   UROBILINOGEN 0.2 04/27/2012 1142   NITRITE NEGATIVE 06/27/2020 0615   LEUKOCYTESUR NEGATIVE 06/27/2020 0615   Sepsis Labs: @LABRCNTIP (procalcitonin:4,lacticacidven:4)  ) Recent Results (from the past 240 hour(s))  Culture, blood (routine x 2)     Status: None (Preliminary result)   Collection Time: 09/22/20 10:01 PM   Specimen: BLOOD  Result Value Ref Range Status   Specimen Description BLOOD RIGHT ANTECUBITAL  Final   Special Requests   Final    BOTTLES DRAWN AEROBIC AND ANAEROBIC Blood Culture adequate volume  Culture   Final    NO GROWTH 4 DAYS Performed at [facility], [address] [place], [place] [address]    Report Status PENDING  Incomplete  Culture, blood (routine x 2)     Status: None (Preliminary result)   Collection Time: 09/22/20 10:03 PM   Specimen: BLOOD  Result Value Ref Range Status   Specimen Description BLOOD LEFT ANTECUBITAL  Final   Special Requests   Final    BOTTLES DRAWN AEROBIC AND ANAEROBIC Blood Culture adequate volume   Culture   Final    NO GROWTH 4 DAYS Performed at [facility], [address], [place], [place] [address]    Report Status PENDING  Incomplete  SARS CORONAVIRUS 2 (TAT 6-24 HRS) Nasopharyngeal Nasopharyngeal Swab     Status: Abnormal   Collection Time: 09/24/20  8:37 AM   Specimen: Nasopharyngeal Swab  Result Value Ref Range Status   SARS Coronavirus 2 POSITIVE (A) NEGATIVE Final    Comment: (NOTE) SARS-CoV-2 target nucleic acids are DETECTED.  The SARS-CoV-2 RNA is generally detectable in upper and lower respiratory specimens during the acute phase of infection. Positive results are indicative of the presence of SARS-CoV-2 RNA. Clinical correlation with patient history and other diagnostic information is  necessary to determine patient infection status. Positive results do not rule out bacterial infection or co-infection with other  viruses.  The expected result is Negative.  Fact Sheet for Patients: SugarRoll.be  Fact Sheet for Healthcare Providers: https://www.woods-mathews.com/  This test is not yet approved or cleared by the [place] FDA and  has been authorized for detection and/or diagnosis of SARS-CoV-2 by FDA under an Emergency Use Authorization (EUA). This EUA will remain  in effect (meaning this test can be used) for the duration of the COVID-19 declaration under Section 564(b)(1) of the Act, 21 U. S.C. section 360bbb-3(b)(1), unless the authorization is terminated or revoked sooner.   Performed at [facility], [place] [address], [place],  [address]          Radiology Studies: VAS [name] ABI WITH/WO TBI  Result Date: 09/25/2020  LOWER EXTREMITY DOPPLER STUDY Patient Name:  CORNELLIUS KROPP  Date of Exam:   09/25/2020 Medical Rec #: [id number]        Accession #:    [id number] Date of Birth: 27-May-1940        Patient Gender: M Patient Age:   80Y Exam Location:  [facility] Procedure:      VAS [name] ABI WITH/WO TBI Referring Phys: [id number] [place] --------------------------------------------------------------------------------  Indications: Ulceration, and peripheral artery disease. High Risk Factors: Diabetes, past history of smoking. Other Factors: PAD, LLE BKA, lung CA with brain mets.  Comparison Study: Previous exam 03/10/19 RLE -ABI 0.52 TBI 0.52 Performing Technologist: Rogelia Rohrer RVT, RDMS  Examination Guidelines: A complete evaluation includes at minimum, Doppler waveform signals and systolic blood pressure reading at the level of bilateral brachial, anterior tibial, and posterior tibial arteries, when vessel segments are accessible. Bilateral testing is considered an integral part of a complete examination. Photoelectric Plethysmograph (PPG) waveforms and toe systolic pressure readings are included as required and additional duplex  testing as needed. Limited examinations for reoccurring indications may be performed as noted.  ABI Findings: +--------+------------------+-----+-------------------+--------+ Right   Rt Pressure (mmHg)IndexWaveform           Comment  +--------+------------------+-----+-------------------+--------+ [id number]  triphasic                   +--------+------------------+-----+-------------------+--------+ PTA     34                0.23 dampened monophasic         +--------+------------------+-----+-------------------+--------+ DP      47                0.32 dampened monophasic         +--------+------------------+-----+-------------------+--------+ +--------+------------------+-----+--------+-------+ Left    Lt Pressure (mmHg)IndexWaveformComment +--------+------------------+-----+--------+-------+ [id number]                    biphasic        +--------+------------------+-----+--------+-------+ +-------+-----------+-----------+------------+------------+ ABI/TBIToday's ABIToday's TBIPrevious ABIPrevious TBI +-------+-----------+-----------+------------+------------+ Right  0.32       0          0.52        0.52         +-------+-----------+-----------+------------+------------+ Right ABIs and TBIs appear decreased.  Summary: Right: Resting right ankle-brachial index indicates severe right lower extremity arterial disease. 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 Left: LT BKA.  *See table(s) above for measurements and observations.  Electronically signed by Harold Barban MD on 09/25/2020 at 9:30:04 PM.    Final         Scheduled Meds:  (feeding supplement) PROSource Plus  30 mL Oral TID BM   atorvastatin  10 mg Oral Daily   feeding supplement  237 mL Oral BID BM   insulin aspart  0-5 Units Subcutaneous QHS   insulin aspart  0-9 Units Subcutaneous TID WC   levETIRAcetam  500 mg Oral BID   multivitamin  1 tablet Oral Daily   pantoprazole  40 mg  Oral Daily   traZODone  50 mg Oral QHS   Continuous Infusions:   ceFAZolin (ANCEF) IV 2 g (09/26/20 1349)     LOS: 4 days    Time spent: 25 minutes    Edwin Dada, MD [facility] 09/26/2020, 6:07 PM     Please page though [place] or Epic secure chat:  For [employer], Adult nurse

## 2020-09-26 NOTE — Progress Notes (Signed)
Patient ID: Thomas Garcia, male   DOB: 1941-01-20, 80 y.o.   MRN: [id number]    Progress Note   Subjective  Day # 4 CC; progressive anemia, heme positive stool/chronic Plavix, recent diagnosis of metastatic lung cancer status post radiation to the brain after craniotomy  Limb ischemia right lower extremity-patient has decided to proceed with right BKA, scheduled for Wednesday  COVID-positive  Hemoglobin 7.5 on admit, down to 6.8 earlier today he is being transfused 2 units of packed RBCs last hemoglobin 8.1  Plavix on hold  Patient is sleeping soundly and did not arouse to voice after several attempts.     Objective   Vital signs in last 24 hours: Temp:  [98.2 F (36.8 C)-99.5 F (37.5 C)] 99 F (37.2 C) (06/20 0700) Pulse Rate:  [97-107] 99 (06/20 0700) Resp:  [17-18] 18 (06/20 0700) BP: (109-146)/(50-74) 135/55 (06/20 0700) SpO2:  [97 %-100 %] 97 % (06/20 0700) Last BM Date: 09/25/20 General: Elderly white male in NAD, somnolent  Intake/Output from previous day: 06/19 0701 - 06/20 0700 In: 1108.8 [P.O.:660; Blood:448.8] Out: 850 [Urine:850] Intake/Output this shift: Total I/O In: 480 [P.O.:480] Out: -   Lab Results: Recent Labs    09/24/20 0052 09/26/20 0114 09/26/20 0737  WBC 20.6* 15.8* 14.9*  HGB 7.5* 6.8* 8.1*  HCT 24.6* 22.3* 25.7*  PLT 522* 519* 506*   BMET Recent Labs    09/24/20 0052 09/26/20 0114  NA 135 134*  K 3.5 3.9  CL 100 101  CO2 27 27  GLUCOSE 149* 209*  BUN 11 11  CREATININE 0.62 0.56*  CALCIUM 7.9* 7.8*   LFT No results for input(s): PROT, ALBUMIN, AST, ALT, ALKPHOS, BILITOT, BILIDIR, IBILI in the last 72 hours. PT/INR No results for input(s): LABPROT, INR in the last 72 hours.  Studies/Results: VAS [name] ABI WITH/WO TBI  Result Date: 09/25/2020  LOWER EXTREMITY DOPPLER STUDY Patient Name:  Thomas Garcia  Date of Exam:   09/25/2020 Medical Rec #: [id number]        Accession #:    [id number] Date of Birth: 11-Mar-1941         Patient Gender: M Patient Age:   80Y Exam Location:  [facility] Procedure:      VAS [name] ABI WITH/WO TBI Referring Phys: [id number] [place] --------------------------------------------------------------------------------  Indications: Ulceration, and peripheral artery disease. High Risk Factors: Diabetes, past history of smoking. Other Factors: PAD, LLE BKA, lung CA with brain mets.  Comparison Study: Previous exam 03/10/19 RLE -ABI 0.52 TBI 0.52 Performing Technologist: Rogelia Rohrer RVT, RDMS  Examination Guidelines: A complete evaluation includes at minimum, Doppler waveform signals and systolic blood pressure reading at the level of bilateral brachial, anterior tibial, and posterior tibial arteries, when vessel segments are accessible. Bilateral testing is considered an integral part of a complete examination. Photoelectric Plethysmograph (PPG) waveforms and toe systolic pressure readings are included as required and additional duplex testing as needed. Limited examinations for reoccurring indications may be performed as noted.  ABI Findings: +--------+------------------+-----+-------------------+--------+ Right   Rt Pressure (mmHg)IndexWaveform           Comment  +--------+------------------+-----+-------------------+--------+ [id number]                    triphasic                   +--------+------------------+-----+-------------------+--------+ PTA     34  0.23 dampened monophasic         +--------+------------------+-----+-------------------+--------+ DP      47                0.32 dampened monophasic         +--------+------------------+-----+-------------------+--------+ +--------+------------------+-----+--------+-------+ Left    Lt Pressure (mmHg)IndexWaveformComment +--------+------------------+-----+--------+-------+ [id number]                    biphasic        +--------+------------------+-----+--------+-------+  +-------+-----------+-----------+------------+------------+ ABI/TBIToday's ABIToday's TBIPrevious ABIPrevious TBI +-------+-----------+-----------+------------+------------+ Right  0.32       0          0.52        0.52         +-------+-----------+-----------+------------+------------+ Right ABIs and TBIs appear decreased.  Summary: Right: Resting right ankle-brachial index indicates severe right lower extremity arterial disease. 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 Left: LT BKA.  *See table(s) above for measurements and observations.  Electronically signed by Thomas Barban MD on 09/25/2020 at 9:30:04 PM.    Final        Assessment / Plan:    #85 80 year old white male with recent diagnosis of metastatic lung cancer to the brain status postradiation after craniotomy  #2 progressive anemia and heme positive stool in setting of chronic Plavix-etiology not clear, was to consider endoscopic evaluation with EGD and colonoscopy off Plavix  #3 critical right lower extremity ischemia-patient has decided to proceed with right BKA which is scheduled for Wednesday  #4 pulmonary embolus status post IVC filter 07/05/2020 #5 adult onset diabetes mellitus  #6 COVID positive-on precautions  Plan; patient has not offered a definite decision about whether or not he wants to proceed with endoscopic evaluation, however surgery is scheduled for Wednesday for critical limb ischemia with plans for right BKA  Would support for now, transfuse as needed, leave off Plavix, and can reassess postoperatively regarding any potential endoscopic evaluation. GI will sign off for now.      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

## 2020-09-27 DIAGNOSIS — L97519 Non-pressure chronic ulcer of other part of right foot with unspecified severity: Secondary | ICD-10-CM

## 2020-09-27 LAB — GLUCOSE, CAPILLARY
Glucose-Capillary: 94 mg/dL (ref 70–99)
Glucose-Capillary: 119 mg/dL — ABNORMAL HIGH (ref 70–99)
Glucose-Capillary: 123 mg/dL — ABNORMAL HIGH (ref 70–99)
Glucose-Capillary: 206 mg/dL — ABNORMAL HIGH (ref 70–99)

## 2020-09-27 LAB — BPAM RBC
Blood Product Expiration Date: 202206252359
ISSUE DATE / TIME: 202206200337
Unit Type and Rh: 6200

## 2020-09-27 LAB — CULTURE, BLOOD (ROUTINE X 2)
Culture: NO GROWTH
Culture: NO GROWTH
Special Requests: ADEQUATE
Special Requests: ADEQUATE

## 2020-09-27 LAB — CBC
HCT: 25.9 % — ABNORMAL LOW (ref 39.0–52.0)
Hemoglobin: 8.4 g/dL — ABNORMAL LOW (ref 13.0–17.0)
MCH: 25.5 pg — ABNORMAL LOW (ref 26.0–34.0)
MCHC: 32.4 g/dL (ref 30.0–36.0)
MCV: 78.7 fL — ABNORMAL LOW (ref 80.0–100.0)
Platelets: 498 10*3/uL — ABNORMAL HIGH (ref 150–400)
RBC: 3.29 MIL/uL — ABNORMAL LOW (ref 4.22–5.81)
RDW: 18.8 % — ABNORMAL HIGH (ref 11.5–15.5)
WBC: 13.3 10*3/uL — ABNORMAL HIGH (ref 4.0–10.5)
nRBC: 0 % (ref 0.0–0.2)

## 2020-09-27 LAB — BASIC METABOLIC PANEL
Anion gap: 11 (ref 5–15)
BUN: 7 mg/dL — ABNORMAL LOW (ref 8–23)
CO2: 25 mmol/L (ref 22–32)
Calcium: 8.1 mg/dL — ABNORMAL LOW (ref 8.9–10.3)
Chloride: 100 mmol/L (ref 98–111)
Creatinine, Ser: 0.5 mg/dL — ABNORMAL LOW (ref 0.61–1.24)
GFR, Estimated: >60 mL/min (ref >60)
Glucose, Bld: 104 mg/dL — ABNORMAL HIGH (ref 70–99)
Potassium: 3.3 mmol/L — ABNORMAL LOW (ref 3.5–5.1)
Sodium: 136 mmol/L (ref 135–145)

## 2020-09-27 LAB — TYPE AND SCREEN
ABO/RH(D): A POS
Antibody Screen: NEGATIVE
Unit division: 0

## 2020-09-27 MED ORDER — MORPHINE SULFATE (PF) 2 MG/ML IV SOLN
2.0000 mg | INTRAVENOUS | Status: DC | PRN
  Administered 2020-09-27 – 2020-10-02 (×11): 2 mg via INTRAVENOUS
  Filled 2020-09-27 (×12): qty 1

## 2020-09-27 MED ORDER — OXYCODONE HCL 5 MG PO TABS
5.0000 mg | ORAL_TABLET | ORAL | Status: DC | PRN
  Administered 2020-09-29 – 2020-10-03 (×9): 5 mg via ORAL
  Filled 2020-09-27 (×10): qty 1

## 2020-09-27 NOTE — Progress Notes (Addendum)
   VASCULAR SURGERY ASSESSMENT & PLAN:   * No surgery date entered * RLE critical limb ischemia with tissue loss. For right BKA tomorrow with Dr. Carlis Abbott. NPO after MN.   Anemia/GIB: Hgb stable at 8.4 g/dL after 3 units PRBCs. SUBJECTIVE:   Ready for surgery. Requests brother to be present  PHYSICAL EXAM:   Vitals:   09/26/20 0700 09/26/20 1400 09/26/20 2022 09/27/20 0453  BP: (!) 135/55 134/71 127/73 (!) 105/54  Pulse: 99 100 (!) 101 96  Resp: 18 17 18 17   Temp: 99 F (37.2 C) 98.1 F (36.7 C) 98.4 F (36.9 C) 98.8 F (37.1 C)  TempSrc: Axillary Oral Oral Oral  SpO2: 97% 100% 99% 99%  Weight:      Height:       General appearance: Awake, alert in no apparent distress Cardiac: Heart rate and rhythm are regular Respirations: Nonlabored Extremities: Ischemic changes noted and unchanged.    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)   PVD (peripheral vascular disease) (HCC)   Unilateral complete BKA, left, sequela (HCC)   Diabetes mellitus type 2 in nonobese (HCC)   Brain mass   Pulmonary embolism (HCC)   Malignant neoplasm of upper lobe of right lung (HCC)   Acute anemia   Ulcer of leg, chronic, right (HCC)   Right foot ulcer (HCC)   Heme + stool   CURRENT MEDS:    (feeding supplement) PROSource Plus  30 mL Oral TID BM   atorvastatin  10 mg Oral Daily   feeding supplement  237 mL Oral BID BM   insulin aspart  0-5 Units Subcutaneous QHS   insulin  aspart  0-9 Units Subcutaneous TID WC   levETIRAcetam  500 mg Oral BID   multivitamin  1 tablet Oral Daily   pantoprazole  40 mg Oral Daily   traZODone  50 mg Oral QHS    Barbie Banner, PA-C  Office: [phone] 09/27/2020   I have seen and evaluated the patient. I agree with the PA note as documented above.  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  He has elected for right below-knee amputation.  Posted for surgery tomorrow with me.  Please keep n.p.o. after midnight.  Consent order placed in chart.  Marty Heck, MD [facility] Office: [phone]

## 2020-09-27 NOTE — Progress Notes (Signed)
OT Cancellation Note  Patient Details Name: Thomas Garcia MRN: [id number] DOB: 20-Sep-1940   Cancelled Treatment:    Reason Eval/Treat Not Completed: Patient declined, no reason specified.  Pt declining to work with therapy today, however, asked pt if he planned to work with therapy once leaving the hospital and he stated that he would work with [facility]  (which he has used in the past)  Pt agreeable for OT to check back the day after his surgery (R BKA scheduled for Wed June 22)  Britt Bottom 09/27/2020, 2:19 PM

## 2020-09-27 NOTE — Plan of Care (Signed)

## 2020-09-27 NOTE — Progress Notes (Signed)
Patient alert and orientedx x4, appetite adequate. To go to OR for R BKA tomorrow, order acknowledged and patient aware. Pain managed with PRN IV morphine. Covid precautions in place and maintained.

## 2020-09-27 NOTE — Consult Note (Signed)
   [facility] Inpatient Consult   09/27/2020  LESSLIE MCKEEHAN 03/20/1941 [id number] [facility] [employer] [ACO] Patient: [employer]  Primary Care Provider:  Doree Albee, MD with [place] an [facility] provider  Patient screened for hospitalization with noted high risk score for unplanned readmission risk and to assess for potential [facility] service needs for post hospital transition.  Review of patient's medical record reveals patient is for surgery tomorrow. Also, reviewed palliative care consult and ongoing follow up.  Plan:  Continue to follow progress and disposition to assess for post hospital care management needs. Will follow up with [facility] team to refer for chronic care management eligibility and needs if applicable.  For questions contact:   Natividad Brood, RN BSN [facility] Liaison  [phone] business mobile phone Toll free office [phone]  Fax number: [phone] [email or web address]

## 2020-09-27 NOTE — Care Management (Signed)
Spoke with [employer] with [facility]. Patient is active with [name] for [facility] ) . Cory aware of plan for surgery tomorrow 09/28/20 and is following for post op PT recommendations.  Magdalen Spatz RN

## 2020-09-27 NOTE — Progress Notes (Signed)
Daily Progress Note   Patient Name: Thomas Garcia       Date: 09/27/2020 DOB: [birth year]  Age: 80 y.o. MRN#: [id number] Attending Physician: Dwyane Dee, MD Primary Care Physician: Doree Albee, MD Admit Date: 09/22/2020  Reason for Consultation/Follow-up: Establishing goals of care  Subjective: I met at bedside with patient and his brother [name]. Confirmed that disposition plan/goal is for patient to return home, with [name] as his primary caregiver.   We discussed code status at length, based on [name] reporting that patient had recently stated he did want resuscitation efforts. Encouraged patient/family to continue DNR/DNI status understanding evidenced based poor outcomes in similar hospitalized patients, as the cause of the arrest is likely associated with chronic/terminal disease rather than a reversible acute cardio-pulmonary event.  I explained that DNR/DNI does not change the medical plan and it only comes into effect after a person has arrested (died).  It is a protective measure to keep [name] from harming the patient in their last moments of life. Patient confirms that he wants to remain DNR/DNI status, with the understanding that he would not receive CPR, defibrillation, intubation, or ACLS meds in the event of cardiopulmonary arrest. [name] supports this decision.    Discussed with patient and his brother the importance of follow-up with oncology outpatient. [name] reports patient has an appointment with an oncologist in [place] in August.   Length of Stay: 5  Current Medications: Scheduled Meds:  . (feeding supplement) PROSource Plus  30 mL Oral TID BM  . atorvastatin  10 mg Oral Daily  . feeding supplement  237 mL Oral BID BM  . insulin aspart  0-5 Units Subcutaneous  QHS  . insulin aspart  0-9 Units Subcutaneous TID WC  . levETIRAcetam  500 mg Oral BID  . multivitamin  1 tablet Oral Daily  . pantoprazole  40 mg Oral Daily  . traZODone  50 mg Oral QHS    Continuous Infusions: .  ceFAZolin (ANCEF) IV 2 g (09/27/20 1402)    PRN Meds: acetaminophen **OR** acetaminophen, morphine injection, ondansetron **OR** ondansetron (ZOFRAN) IV, oxyCODONE  Physical Exam Constitutional:      General: He is not in acute distress.    Appearance: He is ill-appearing.  Pulmonary:     Effort: Pulmonary effort is normal.  Musculoskeletal:  Left Lower Extremity: Left leg is amputated below knee.  Skin:    Comments: Right leg with multiple wounds  Neurological:     Mental Status: He is alert and oriented to person, place, and time.  Psychiatric:        Mood and Affect: Affect is flat.            Vital Signs: BP (!) 143/61 (BP Location: Left Arm)   Pulse 97   Temp 97.7 F (36.5 C) (Axillary)   Resp 18   Ht $R'5\' 9"'pK$  (1.753 m)   Wt 77.1 kg   SpO2 98%   BMI 25.10 kg/m  SpO2: SpO2: 98 % O2 Device: O2 Device: Room Air O2 Flow Rate: O2 Flow Rate (L/min): 0 L/min       Palliative Assessment/Data: PPS 30%       Palliative Care Assessment & Plan   HPI/Patient Profile: 80 y.o. male  with past medical history of non-small cell lung cancer metastatic to brain, pulmonary embolism, anemia, PAD status post left BKA, uncontrolled diabetes mellitus, HTN, and GERD. He was sent to the emergency department on 09/22/2020 from his PCP office with hemoglobin of 5.5. He reported weakness and pain from wounds on his right leg. ED Course:  Blood pressure systolic initially 70/62.  Improved with 559ml bolus. Leukocytosis of 15.4.  Hemoglobin 5.6.  2 units PRBC ordered for transfusion hospitalist admit for acute on chronic anemia.   Recent prolonged hospitalization-3/20 to 07/14/2020 for right upper lobe lung cancer, right occipital mass, pulmonary embolism.  He was started on  full anticoagulation but this was held due to hemoptysis and bleeding from brain mass.  A retrievable IVC filter 3/29.  Further decision on anticoagulation was deferred until patient followed up with pulmonology postdischarge.     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, s/p craniotomy and radiation - acute on chronic anemia - critical ischemia of right foot - recent PE - incidental COVID  Recommendations/Plan: Code status confirmed as DNR/DNI  Disposition goal is return home - confirmed that patient's brother lives with him and is his caregiver Per brother, patient has an appointment with an oncologist in [place] in August PMT will continue to follow  Goals of Care and Additional Recommendations: Limitations on Scope of Treatment: Full Scope Treatment  Code Status: DNR/DNI   Prognosis:  < 6 months  Discharge Planning: Home with Home Health    Thank you for allowing the Palliative Medicine Team to assist in the care of this patient.   Total Time 35 minutes Prolonged Time Billed  no       Greater than 50%  of this time was spent counseling and coordinating care related to the above assessment and plan.  [name], NP  Please contact Palliative Medicine Team phone at [phone] for questions and concerns.

## 2020-09-27 NOTE — Progress Notes (Signed)
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  Apparently, he had routine blood work that revealed a hemoglobin of 5.5.  He was directed to the emergency department for further evaluation.  Patient transferred to [facility] for worsening right lower extremity pain concerning for infection, history of left BKA, transferred to [facility] for vascular intervention/evaluation.    Previously prolonged hospitalization from 06/26/2020 to 07/14/2020 at which time the patient was diagnosed with adenocarcinoma of the right lung when he presented with a cavitary lung mass. During that hospitalization, the patient underwent stereotactic resection of a brain mass which was felt to be metastasis from his lung cancer.  In addition his hospitalization was prolonged because of the PE.  He was initially started on anticoagulation, but this was stopped secondary to the patient's hemoptysis and bleeding from his brain mass. Retrievable IVC filter was placed on 07/05/2020.  Further anticoagulation was deferred until he had followed up with pulmonology postdischarge.  During that hospitalization, the patient was also treated for postobstructive pneumonia. Unfortunately, he has had poor follow-up since his discharge from the hospital.  He has not yet followed up with medical oncology.  However the patient did undergo radiation to the brain from 08/25/2020 to 08/31/2020.  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  Tentative plan is for surgery on  09/28/2020.  Interval History:  Resting in bed this morning complaining of ongoing pain in his right lower extremity.  He is hoping that surgery will mitigate some of the pain.  ROS: Constitutional: negative for chills and fevers, Respiratory: negative for cough, Cardiovascular: negative for chest pain, and Gastrointestinal: negative for abdominal pain  Assessment & Plan: Critical ischemia of the right foot Blood cultures negative Plan for amputation on 6/22 -Continue cefazolin - Continue atorvastatin - Consult vascular surgery, appreciate recommendations - Hold aspirin and Plavix due to acute on chronic anemia   Acute on chronic anemia - s/p multiple transfusions on admission -Hold Plavix - Continue PPI - Consult gastroenterology, appreciate recommendations; likely outpt w/u further if still needed   Diabetes Glucose controlled - Continue sliding scale corrections   Adenocarcinoma of the lung, metastatic to the brain -Continue Keppra   Recent pulmonary embolism Has IVC filter in place   Incidental COVID Mild, no treatment necessary, isolation to end 6/28   Pressure injury, stage I, right buttocks, POA  Old records reviewed in assessment of this patient  Antimicrobials: Ancef  DVT prophylaxis: SCDs Start: 09/22/20 2148   Code Status:   Code Status: DNR Family Communication:   Disposition Plan: Status is: Inpatient  Remains inpatient appropriate because:Ongoing diagnostic testing needed not appropriate for outpatient work up, Unsafe d/c plan, and IV treatments appropriate due to intensity of illness or inability to take PO  Dispo: The patient is from: Home              Anticipated d/c is to:  pending PT eval after surgery              Patient  currently is not medically stable to d/c.   Difficult to place patient : Unknown    Risk of unplanned readmission score: Unplanned Admission- Pilot do not use: 28.11   Objective: Blood pressure (!) 143/61, pulse 97,  temperature 97.7 F (36.5 C), temperature source Axillary, resp. rate 18, height 5\' 9"  (1.753 m), weight 77.1 kg, SpO2 98 %.  Examination: General appearance: alert, cooperative, and no distress Head: Normocephalic, without obvious abnormality, atraumatic Eyes:  EOMI Lungs: clear to auscultation bilaterally Heart: regular rate and rhythm and S1, S2 normal Abdomen: normal findings: bowel sounds normal and soft, non-tender Extremities:  RLE wrapped in dressing Skin: mobility and turgor normal Neurologic: no focal deficits  Consultants:  Vascular surgery  Procedures:    Data Reviewed: I have personally reviewed following labs and imaging studies Results for orders placed or performed during the hospital encounter of 09/22/20 (from the past 24 hour(s))  Glucose, capillary     Status: Abnormal   Collection Time: 09/26/20  5:11 PM  Result Value Ref Range   Glucose-Capillary 183 (H) 70 - 99 mg/dL  Glucose, capillary     Status: Abnormal   Collection Time: 09/26/20  8:18 PM  Result Value Ref Range   Glucose-Capillary 140 (H) 70 - 99 mg/dL  CBC     Status: Abnormal   Collection Time: 09/27/20  2:10 AM  Result Value Ref Range   WBC 13.3 (H) 4.0 - 10.5 K/uL   RBC 3.29 (L) 4.22 - 5.81 MIL/uL   Hemoglobin 8.4 (L) 13.0 - 17.0 g/dL   HCT 25.9 (L) 39.0 - 52.0 %   MCV 78.7 (L) 80.0 - 100.0 fL   MCH 25.5 (L) 26.0 - 34.0 pg   MCHC 32.4 30.0 - 36.0 g/dL   RDW 18.8 (H) 11.5 - 15.5 %   Platelets 498 (H) 150 - 400 K/uL   nRBC 0.0 0.0 - 0.2 %  Basic metabolic panel     Status: Abnormal   Collection Time: 09/27/20  2:10 AM  Result Value Ref Range   Sodium 136 135 - 145 mmol/L   Potassium 3.3 (L) 3.5 - 5.1 mmol/L   Chloride 100 98 - 111 mmol/L   CO2 25 22 - 32 mmol/L   Glucose, Bld 104 (H) 70 - 99 mg/dL   BUN 7 (L) 8 - 23 mg/dL   Creatinine, Ser 0.50 (L) 0.61 - 1.24 mg/dL   Calcium 8.1 (L) 8.9 - 10.3 mg/dL   GFR, Estimated >60 >60 mL/min   Anion gap 11 5 - 15  Glucose, capillary      Status: None   Collection Time: 09/27/20  7:55 AM  Result Value Ref Range   Glucose-Capillary 94 70 - 99 mg/dL  Glucose, capillary     Status: Abnormal   Collection Time: 09/27/20  1:16 PM  Result Value Ref Range   Glucose-Capillary 119 (H) 70 - 99 mg/dL    Recent Results (from the past 240 hour(s))  Culture, blood (routine x 2)     Status: None   Collection Time: 09/22/20 10:01 PM   Specimen: BLOOD  Result Value Ref Range Status   Specimen Description BLOOD RIGHT ANTECUBITAL  Final   Special Requests   Final    BOTTLES DRAWN AEROBIC AND ANAEROBIC Blood Culture adequate volume   Culture   Final    NO GROWTH 5 DAYS Performed at [facility], [address] [place], [place] [address]    Report Status 09/27/2020 FINAL  Final  Culture,  blood (routine x 2)     Status: None   Collection Time: 09/22/20 10:03 PM   Specimen: BLOOD  Result Value Ref Range Status   Specimen Description BLOOD LEFT ANTECUBITAL  Final   Special Requests   Final    BOTTLES DRAWN AEROBIC AND ANAEROBIC Blood Culture adequate volume   Culture   Final    NO GROWTH 5 DAYS Performed at [facility], [address], [place], [place] [address]    Report Status 09/27/2020 FINAL  Final  SARS CORONAVIRUS 2 (TAT 6-24 HRS) Nasopharyngeal Nasopharyngeal Swab     Status: Abnormal   Collection Time: 09/24/20  8:37 AM   Specimen: Nasopharyngeal Swab  Result Value Ref Range Status   SARS Coronavirus 2 POSITIVE (A) NEGATIVE Final    Comment: (NOTE) SARS-CoV-2 target nucleic acids are DETECTED.  The SARS-CoV-2 RNA is generally detectable in upper and lower respiratory specimens during the acute phase of infection. Positive results are indicative of the presence of SARS-CoV-2 RNA. Clinical correlation with patient history and other diagnostic information is  necessary to determine patient infection status. Positive results do not rule out bacterial infection or co-infection with other viruses.  The expected result is  Negative.  Fact Sheet for Patients: SugarRoll.be  Fact Sheet for Healthcare Providers: https://www.woods-mathews.com/  This test is not yet approved or cleared by the [place] FDA and  has been authorized for detection and/or diagnosis of SARS-CoV-2 by FDA under an Emergency Use Authorization (EUA). This EUA will remain  in effect (meaning this test can be used) for the duration of the COVID-19 declaration under Section 564(b)(1) of the Act, 21 U. S.C. section 360bbb-3(b)(1), unless the authorization is terminated or revoked sooner.   Performed at [facility], [place] [address] [place], [place] [address]      Radiology Studies: VAS [name] ABI WITH/WO TBI  Result Date: 09/25/2020  LOWER EXTREMITY DOPPLER STUDY Patient Name:  QUASHAUN LAZALDE  Date of Exam:   09/25/2020 Medical Rec #: [id number]        Accession #:    [id number] Date of Birth: 08-05-1940        Patient Gender: M Patient Age:   80Y Exam Location:  [facility] Procedure:      VAS [name] ABI WITH/WO TBI Referring Phys: [id number] [place] --------------------------------------------------------------------------------  Indications: Ulceration, and peripheral artery disease. High Risk Factors: Diabetes, past history of smoking. Other Factors: PAD, LLE BKA, lung CA with brain mets.  Comparison Study: Previous exam 03/10/19 RLE -ABI 0.52 TBI 0.52 Performing Technologist: Rogelia Rohrer RVT, RDMS  Examination Guidelines: A complete evaluation includes at minimum, Doppler waveform signals and systolic blood pressure reading at the level of bilateral brachial, anterior tibial, and posterior tibial arteries, when vessel segments are accessible. Bilateral testing is considered an integral part of a complete examination. Photoelectric Plethysmograph (PPG) waveforms and toe systolic pressure readings are included as required and additional duplex testing as needed. Limited examinations for  reoccurring indications may be performed as noted.  ABI Findings: +--------+------------------+-----+-------------------+--------+ Right   Rt Pressure (mmHg)IndexWaveform           Comment  +--------+------------------+-----+-------------------+--------+ [id number]                    triphasic                   +--------+------------------+-----+-------------------+--------+ PTA     34  0.23 dampened monophasic         +--------+------------------+-----+-------------------+--------+ DP      47                0.32 dampened monophasic         +--------+------------------+-----+-------------------+--------+ +--------+------------------+-----+--------+-------+ Left    Lt Pressure (mmHg)IndexWaveformComment +--------+------------------+-----+--------+-------+ [id number]                    biphasic        +--------+------------------+-----+--------+-------+ +-------+-----------+-----------+------------+------------+ ABI/TBIToday's ABIToday's TBIPrevious ABIPrevious TBI +-------+-----------+-----------+------------+------------+ Right  0.32       0          0.52        0.52         +-------+-----------+-----------+------------+------------+ Right ABIs and TBIs appear decreased.  Summary: Right: Resting right ankle-brachial index indicates severe right lower extremity arterial disease. 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 Left: LT BKA.  *See table(s) above for measurements and observations.  Electronically signed by Harold Barban MD on 09/25/2020 at 9:30:04 PM.    Final    VAS [name] ABI WITH/WO TBI  Final Result    CT TIBIA FIBULA RIGHT W CONTRAST  Final Result    US ARTERIAL ABI (SCREENING LOWER EXTREMITY)    (Results Pending)    Scheduled Meds:  (feeding supplement) PROSource Plus  30 mL Oral TID BM   atorvastatin  10 mg Oral Daily   feeding supplement  237 mL Oral BID BM   insulin aspart  0-5 Units Subcutaneous QHS   insulin aspart  0-9 Units  Subcutaneous TID WC   levETIRAcetam  500 mg Oral BID   multivitamin  1 tablet Oral Daily   pantoprazole  40 mg Oral Daily   traZODone  50 mg Oral QHS   PRN Meds: acetaminophen **OR** acetaminophen, morphine injection, ondansetron **OR** ondansetron (ZOFRAN) IV, oxyCODONE Continuous Infusions:   ceFAZolin (ANCEF) IV 2 g (09/27/20 1402)     LOS: 5 days  Time spent: Greater than 50% of the 35 minute visit was spent in counseling/coordination of care for the patient as laid out in the A&P.   Dwyane Dee, MD [facility] 09/27/2020, 3:30 PM

## 2020-09-28 ENCOUNTER — Inpatient Hospital Stay (HOSPITAL_COMMUNITY): Payer: Medicare HMO | Admitting: Certified Registered"

## 2020-09-28 ENCOUNTER — Encounter (HOSPITAL_COMMUNITY)
Admission: EM | Disposition: A | Discharge status: Home Health | Payer: Self-pay | Source: Home / Self Care | Attending: Internal Medicine

## 2020-09-28 DIAGNOSIS — I96 Gangrene, not elsewhere classified: Secondary | ICD-10-CM

## 2020-09-28 HISTORY — PX: AMPUTATION: SHX166

## 2020-09-28 LAB — CBC
HCT: 26.1 % — ABNORMAL LOW (ref 39.0–52.0)
Hemoglobin: 8.2 g/dL — ABNORMAL LOW (ref 13.0–17.0)
MCH: 25.2 pg — ABNORMAL LOW (ref 26.0–34.0)
MCHC: 31.4 g/dL (ref 30.0–36.0)
MCV: 80.3 fL (ref 80.0–100.0)
Platelets: 560 10*3/uL — ABNORMAL HIGH (ref 150–400)
RBC: 3.25 MIL/uL — ABNORMAL LOW (ref 4.22–5.81)
RDW: 19.3 % — ABNORMAL HIGH (ref 11.5–15.5)
WBC: 13.6 10*3/uL — ABNORMAL HIGH (ref 4.0–10.5)
nRBC: 0 % (ref 0.0–0.2)

## 2020-09-28 LAB — BASIC METABOLIC PANEL
Anion gap: 7 (ref 5–15)
BUN: 8 mg/dL (ref 8–23)
CO2: 27 mmol/L (ref 22–32)
Calcium: 8 mg/dL — ABNORMAL LOW (ref 8.9–10.3)
Chloride: 98 mmol/L (ref 98–111)
Creatinine, Ser: 0.53 mg/dL — ABNORMAL LOW (ref 0.61–1.24)
GFR, Estimated: >60 mL/min (ref >60)
Glucose, Bld: 93 mg/dL (ref 70–99)
Potassium: 3.1 mmol/L — ABNORMAL LOW (ref 3.5–5.1)
Sodium: 132 mmol/L — ABNORMAL LOW (ref 135–145)

## 2020-09-28 LAB — GLUCOSE, CAPILLARY
Glucose-Capillary: 104 mg/dL — ABNORMAL HIGH (ref 70–99)
Glucose-Capillary: 101 mg/dL — ABNORMAL HIGH (ref 70–99)
Glucose-Capillary: 102 mg/dL — ABNORMAL HIGH (ref 70–99)
Glucose-Capillary: 152 mg/dL — ABNORMAL HIGH (ref 70–99)
Glucose-Capillary: 151 mg/dL — ABNORMAL HIGH (ref 70–99)

## 2020-09-28 SURGERY — AMPUTATION BELOW KNEE
Anesthesia: General | Site: Knee | Laterality: Right

## 2020-09-28 MED ORDER — ONDANSETRON HCL 4 MG/2ML IJ SOLN
INTRAMUSCULAR | Status: AC
  Filled 2020-09-28: qty 2

## 2020-09-28 MED ORDER — PHENYLEPHRINE 40 MCG/ML (10ML) SYRINGE FOR IV PUSH (FOR BLOOD PRESSURE SUPPORT)
PREFILLED_SYRINGE | INTRAVENOUS | Status: AC
  Filled 2020-09-28: qty 10

## 2020-09-28 MED ORDER — LACTATED RINGERS IV SOLN: INTRAVENOUS | Status: DC | PRN

## 2020-09-28 MED ORDER — FENTANYL CITRATE (PF) 100 MCG/2ML IJ SOLN
INTRAMUSCULAR | Status: AC
  Administered 2020-09-28: 100 ug via INTRAVENOUS
  Filled 2020-09-28: qty 2

## 2020-09-28 MED ORDER — DEXAMETHASONE SODIUM PHOSPHATE 10 MG/ML IJ SOLN
INTRAMUSCULAR | Status: DC | PRN
  Administered 2020-09-28: 5 mg via INTRAVENOUS

## 2020-09-28 MED ORDER — ROCURONIUM BROMIDE 10 MG/ML (PF) SYRINGE
PREFILLED_SYRINGE | INTRAVENOUS | Status: DC | PRN
  Administered 2020-09-28: 20 mg via INTRAVENOUS
  Administered 2020-09-28: 30 mg via INTRAVENOUS

## 2020-09-28 MED ORDER — PROPOFOL 10 MG/ML IV BOLUS
INTRAVENOUS | Status: AC
  Filled 2020-09-28: qty 20

## 2020-09-28 MED ORDER — ROCURONIUM BROMIDE 10 MG/ML (PF) SYRINGE
PREFILLED_SYRINGE | INTRAVENOUS | Status: AC
  Filled 2020-09-28: qty 10

## 2020-09-28 MED ORDER — ONDANSETRON HCL 4 MG/2ML IJ SOLN: 4.0000 mg | Freq: Once | INTRAMUSCULAR | Status: DC | PRN

## 2020-09-28 MED ORDER — FENTANYL CITRATE (PF) 250 MCG/5ML IJ SOLN
INTRAMUSCULAR | Status: AC
  Filled 2020-09-28: qty 5

## 2020-09-28 MED ORDER — PHENYLEPHRINE HCL-NACL 10-0.9 MG/250ML-% IV SOLN
INTRAVENOUS | Status: DC | PRN
  Administered 2020-09-28: 40 ug/min via INTRAVENOUS
  Administered 2020-09-28: 30 ug/min via INTRAVENOUS

## 2020-09-28 MED ORDER — EPINEPHRINE 1 MG/10ML IJ SOSY
PREFILLED_SYRINGE | INTRAMUSCULAR | Status: AC
  Filled 2020-09-28: qty 10

## 2020-09-28 MED ORDER — ACETAMINOPHEN 10 MG/ML IV SOLN
1000.0000 mg | Freq: Once | INTRAVENOUS | Status: DC | PRN
  Administered 2020-09-28: 1000 mg via INTRAVENOUS

## 2020-09-28 MED ORDER — ACETAMINOPHEN 10 MG/ML IV SOLN
INTRAVENOUS | Status: AC
  Filled 2020-09-28: qty 100

## 2020-09-28 MED ORDER — SUGAMMADEX SODIUM 200 MG/2ML IV SOLN
INTRAVENOUS | Status: DC | PRN
  Administered 2020-09-28: 200 mg via INTRAVENOUS

## 2020-09-28 MED ORDER — HYDROMORPHONE HCL 1 MG/ML IJ SOLN
0.5000 mg | INTRAMUSCULAR | Status: AC | PRN
Start: 2020-09-28 — End: 2020-09-28
  Administered 2020-09-28 (×2): 0.5 mg via INTRAVENOUS

## 2020-09-28 MED ORDER — CEFAZOLIN SODIUM-DEXTROSE 2-3 GM-%(50ML) IV SOLR
INTRAVENOUS | Status: DC | PRN
  Administered 2020-09-28: 2 g via INTRAVENOUS

## 2020-09-28 MED ORDER — SUCCINYLCHOLINE CHLORIDE 200 MG/10ML IV SOSY
PREFILLED_SYRINGE | INTRAVENOUS | Status: AC
  Filled 2020-09-28: qty 10

## 2020-09-28 MED ORDER — SUCCINYLCHOLINE CHLORIDE 20 MG/ML IJ SOLN
INTRAMUSCULAR | Status: DC | PRN
  Administered 2020-09-28: 80 mg via INTRAVENOUS

## 2020-09-28 MED ORDER — DEXAMETHASONE SODIUM PHOSPHATE 10 MG/ML IJ SOLN
INTRAMUSCULAR | Status: AC
  Filled 2020-09-28: qty 1

## 2020-09-28 MED ORDER — LIDOCAINE 2% (20 MG/ML) 5 ML SYRINGE
INTRAMUSCULAR | Status: AC
  Filled 2020-09-28: qty 5

## 2020-09-28 MED ORDER — ONDANSETRON HCL 4 MG/2ML IJ SOLN
INTRAMUSCULAR | Status: DC | PRN
  Administered 2020-09-28: 4 mg via INTRAVENOUS

## 2020-09-28 MED ORDER — FENTANYL CITRATE (PF) 100 MCG/2ML IJ SOLN
25.0000 ug | INTRAMUSCULAR | Status: DC | PRN
  Administered 2020-09-28 (×3): 50 ug via INTRAVENOUS

## 2020-09-28 MED ORDER — HYDROMORPHONE HCL 1 MG/ML IJ SOLN
INTRAMUSCULAR | Status: AC
  Filled 2020-09-28: qty 1

## 2020-09-28 MED ORDER — EPHEDRINE 5 MG/ML INJ
INTRAVENOUS | Status: AC
  Filled 2020-09-28: qty 10

## 2020-09-28 MED ORDER — FENTANYL CITRATE (PF) 100 MCG/2ML IJ SOLN
INTRAMUSCULAR | Status: AC
  Filled 2020-09-28: qty 2

## 2020-09-28 MED ORDER — POTASSIUM CHLORIDE CRYS ER 20 MEQ PO TBCR
40.0000 meq | EXTENDED_RELEASE_TABLET | Freq: Once | ORAL | Status: AC
  Administered 2020-09-28: 40 meq via ORAL
  Filled 2020-09-28: qty 2

## 2020-09-28 MED ORDER — PHENYLEPHRINE 40 MCG/ML (10ML) SYRINGE FOR IV PUSH (FOR BLOOD PRESSURE SUPPORT)
PREFILLED_SYRINGE | INTRAVENOUS | Status: DC | PRN
  Administered 2020-09-28 (×2): 80 ug via INTRAVENOUS

## 2020-09-28 MED ORDER — FENTANYL CITRATE (PF) 250 MCG/5ML IJ SOLN
INTRAMUSCULAR | Status: DC | PRN
  Administered 2020-09-28 (×3): 50 ug via INTRAVENOUS

## 2020-09-28 MED ORDER — PHENYLEPHRINE HCL (PRESSORS) 10 MG/ML IV SOLN
INTRAVENOUS | Status: AC
  Filled 2020-09-28: qty 1

## 2020-09-28 MED ORDER — ALBUMIN HUMAN 5 % IV SOLN: INTRAVENOUS | Status: DC | PRN

## 2020-09-28 MED ORDER — LIDOCAINE 2% (20 MG/ML) 5 ML SYRINGE
INTRAMUSCULAR | Status: DC | PRN
  Administered 2020-09-28: 60 mg via INTRAVENOUS

## 2020-09-28 MED ORDER — 0.9 % SODIUM CHLORIDE (POUR BTL) OPTIME
TOPICAL | Status: DC | PRN
  Administered 2020-09-28: 1000 mL

## 2020-09-28 MED ORDER — PROPOFOL 10 MG/ML IV BOLUS
INTRAVENOUS | Status: DC | PRN
  Administered 2020-09-28 (×2): 100 mg via INTRAVENOUS

## 2020-09-28 MED ORDER — AMISULPRIDE (ANTIEMETIC) 5 MG/2ML IV SOLN: 10.0000 mg | Freq: Once | INTRAVENOUS | Status: DC | PRN

## 2020-09-28 SURGICAL SUPPLY — 51 items
BANDAGE ESMARK 6X9 LF (GAUZE/BANDAGES/DRESSINGS) IMPLANT
BLADE SAW SAG 73X25 THK (BLADE) ×2
BLADE SAW SGTL 73X25 THK (BLADE) ×1 IMPLANT
BNDG CMPR 9X6 STRL LF SNTH (GAUZE/BANDAGES/DRESSINGS)
BNDG CMPR MED 15X6 ELC VLCR LF (GAUZE/BANDAGES/DRESSINGS) ×1
BNDG COHESIVE 6X5 TAN STRL LF (GAUZE/BANDAGES/DRESSINGS) ×3 IMPLANT
BNDG ELASTIC 4X5.8 VLCR STR LF (GAUZE/BANDAGES/DRESSINGS) ×3 IMPLANT
BNDG ELASTIC 6X15 VLCR STRL LF (GAUZE/BANDAGES/DRESSINGS) ×3 IMPLANT
BNDG ELASTIC 6X5.8 VLCR STR LF (GAUZE/BANDAGES/DRESSINGS) ×3 IMPLANT
BNDG ESMARK 6X9 LF (GAUZE/BANDAGES/DRESSINGS)
BNDG GAUZE ELAST 4 BULKY (GAUZE/BANDAGES/DRESSINGS) ×3 IMPLANT
CANISTER SUCT 3000ML PPV (MISCELLANEOUS) ×3 IMPLANT
CLIP VESOCCLUDE MED 6/CT (CLIP) IMPLANT
COVER BACK TABLE 60X90IN (DRAPES) IMPLANT
COVER SURGICAL LIGHT HANDLE (MISCELLANEOUS) ×3 IMPLANT
COVER WAND RF STERILE (DRAPES) ×3 IMPLANT
DRAIN CHANNEL 19F RND (DRAIN) IMPLANT
DRAPE HALF SHEET 40X57 (DRAPES) ×3 IMPLANT
DRAPE ORTHO SPLIT 77X108 STRL (DRAPES) ×6
DRAPE SURG ORHT 6 SPLT 77X108 (DRAPES) ×2 IMPLANT
DRSG ADAPTIC 3X8 NADH LF (GAUZE/BANDAGES/DRESSINGS) ×3 IMPLANT
ELECT REM PT RETURN 9FT ADLT (ELECTROSURGICAL) ×3
ELECTRODE REM PT RTRN 9FT ADLT (ELECTROSURGICAL) ×1 IMPLANT
EVACUATOR SILICONE 100CC (DRAIN) IMPLANT
GAUZE SPONGE 4X4 12PLY STRL (GAUZE/BANDAGES/DRESSINGS) ×3 IMPLANT
GLOVE SRG 8 PF TXTR STRL LF DI (GLOVE) ×1 IMPLANT
GLOVE SURG ENC MOIS LTX SZ7.5 (GLOVE) ×3 IMPLANT
GLOVE SURG UNDER POLY LF SZ8 (GLOVE) ×3
GOWN STRL REUS W/ TWL LRG LVL3 (GOWN DISPOSABLE) ×2 IMPLANT
GOWN STRL REUS W/ TWL XL LVL3 (GOWN DISPOSABLE) ×2 IMPLANT
GOWN STRL REUS W/TWL LRG LVL3 (GOWN DISPOSABLE) ×6
GOWN STRL REUS W/TWL XL LVL3 (GOWN DISPOSABLE) ×6
KIT BASIN OR (CUSTOM PROCEDURE TRAY) ×3 IMPLANT
KIT TURNOVER KIT B (KITS) ×3 IMPLANT
MARKER SKIN DUAL TIP RULER LAB (MISCELLANEOUS) ×3 IMPLANT
NS IRRIG 1000ML POUR BTL (IV SOLUTION) ×3 IMPLANT
PACK GENERAL/GYN (CUSTOM PROCEDURE TRAY) ×3 IMPLANT
PAD ARMBOARD 7.5X6 YLW CONV (MISCELLANEOUS) ×6 IMPLANT
STAPLER VISISTAT 35W (STAPLE) ×3 IMPLANT
STOCKINETTE IMPERVIOUS LG (DRAPES) ×3 IMPLANT
SUT ETHILON 3 0 PS 1 (SUTURE) IMPLANT
SUT SILK 2 0 (SUTURE) ×3
SUT SILK 2 0 SH CR/8 (SUTURE) ×3 IMPLANT
SUT SILK 2-0 18XBRD TIE 12 (SUTURE) ×1 IMPLANT
SUT SILK 3 0 (SUTURE) ×3
SUT SILK 3-0 18XBRD TIE 12 (SUTURE) ×1 IMPLANT
SUT VIC AB 2-0 CT1 18 (SUTURE) ×9 IMPLANT
SUT VIC AB 3-0 SH 18 (SUTURE) IMPLANT
TOWEL GREEN STERILE (TOWEL DISPOSABLE) ×6 IMPLANT
UNDERPAD 30X36 HEAVY ABSORB (UNDERPADS AND DIAPERS) ×3 IMPLANT
WATER STERILE IRR 1000ML POUR (IV SOLUTION) ×3 IMPLANT

## 2020-09-28 NOTE — Transfer of Care (Signed)
Immediate Anesthesia Transfer of Care Note  Patient: Thomas HAYMOND  Procedure(s) Performed: RIGHT BELOW KNEE AMPUTATION (Right: Knee)  Patient Location: PACU  Anesthesia Type:General  Level of Consciousness: awake and patient cooperative  Airway & Oxygen Therapy: Patient Spontanous Breathing and Patient connected to nasal cannula oxygen  Post-op Assessment: Report given to RN and Post -op Vital signs reviewed and stable  Post vital signs: Reviewed  Last Vitals:  Vitals Value Taken Time  BP 131/89 09/28/20 1549  Temp    Pulse 82 09/28/20 1558  Resp 20 09/28/20 1558  SpO2 100 % 09/28/20 1558  Vitals shown include unvalidated device data.  Last Pain:  Vitals:   09/28/20 1102  TempSrc:   PainSc: 1       Patients Stated Pain Goal: 3 (93/81/01 7510)  Complications: No notable events documented.

## 2020-09-28 NOTE — Anesthesia Preprocedure Evaluation (Addendum)
Anesthesia Evaluation  Patient identified by MRN, date of birth, ID band Patient awake    Reviewed: Allergy & Precautions, NPO status , Patient's Chart, lab work & pertinent test results  Airway Mallampati: III  TM Distance: >3 FB Neck ROM: Full    Dental  (+) Edentulous Upper, Edentulous Lower   Pulmonary Patient abstained from smoking., former smoker, PE   Pulmonary exam normal breath sounds clear to auscultation       Cardiovascular hypertension, Pt. on medications + Peripheral Vascular Disease  Normal cardiovascular exam Rhythm:Regular Rate:Normal  ECG: rate 82. Sinus rhythm RBBB and LAFB   Neuro/Psych  Neuromuscular disease negative psych ROS   GI/Hepatic negative GI ROS, Neg liver ROS,   Endo/Other  diabetes  Renal/GU negative Renal ROS     Musculoskeletal negative musculoskeletal ROS (+)   Abdominal   Peds  Hematology  (+) anemia , HLD   Anesthesia Other Findings RIGHT LEG ULCERS Covid positive  Reproductive/Obstetrics                            Anesthesia Physical Anesthesia Plan  ASA: 3  Anesthesia Plan: General   Post-op Pain Management:    Induction: Intravenous and Rapid sequence  PONV Risk Score and Plan: 2 and Ondansetron, Dexamethasone and Treatment may vary due to age or medical condition  Airway Management Planned: Oral ETT  Additional Equipment:   Intra-op Plan:   Post-operative Plan: Extubation in OR  Informed Consent: I have reviewed the patients History and Physical, chart, labs and discussed the procedure including the risks, benefits and alternatives for the proposed anesthesia with the patient or authorized representative who has indicated his/her understanding and acceptance.   Patient has DNR.  Discussed DNR with patient and Suspend DNR.     Plan Discussed with: CRNA  Anesthesia Plan Comments:       Anesthesia Quick Evaluation

## 2020-09-28 NOTE — Op Note (Signed)
OPERATIVE NOTE   PROCEDURE: right below-the-knee amputation  PRE-OPERATIVE DIAGNOSIS: right foot tissue loss  POST-OPERATIVE DIAGNOSIS: same as above  SURGEON: Marty Heck MD  ASSISTANT(S): Risa Grill, PA  ANESTHESIA: general  ESTIMATED BLOOD LOSS: <200 mL  FINDING(S): Right below knee amputation with marginal tissue given some fat necrosis and marginal appearing fascia.  SPECIMEN(S):  right below-the-knee amputation  INDICATIONS:   80 year old male who presents with right leg gangrene.  The patient is scheduled for a right below-the-knee amputation.  I discussed in depth with the patient the risks, benefits, and alternatives to this procedure.  The patient is aware that the risk of this operation included but are not limited to:  bleeding, infection, myocardial infarction, stroke, death, failure to heal amputation wound, and possible need for more proximal amputation.  The patient is aware of the risks and agrees proceed forward with the procedure.   DESCRIPTION:  After full informed written consent was obtained from the patient, the patient was brought back to the operating room, and placed supine upon the operating table.  Prior to induction, the patient received IV antibiotics.  The patient was then prepped and draped in the standard fashion for a below-the-knee amputation.  I marked out the anterior incision 10 cm distal to the tibial tuberosity and then the marked out a posterior flap that was one third of the circumference of the calf in length.   I made the incisions for these flaps, and then dissected through the subcutaneous tissue, fascia, and muscle anteriorly with electrocautery.  I also similarly developed a thick posterior flap of muscle with electrocautery.  I elevated  the periosteal tissue superiorly sand I then transected the tibia with an oscillating saw.  In a similar fashion, I cut back the fibula about two centimeters higher than the level of the tibia  with a bone cutter.  I then finished releasing the posterior muscle flap with electrocautery.    At this point, the specimen was passed off the field as the below-the-knee amputation.  At this point, I clamped all visibly bleeding arteries and veins using a combination of suture ligation and electrocautery.  Bleeding continued to be controlled with electrocautery and suture ligature.  All tibial vessels were ligated with 3-0 silk suture ligature.  The stump was washed off with sterile normal saline and no further active bleeding was noted.    I reapproximated the anterior and posterior fascia  with interrupted stitches of 2-0 Vicryl.  This was completed along the entire length of anterior and posterior fascia until there were no more loose space in the fascial line.  The skin was then reapproximated with staples.  The stump was washed off and dried.    The incision was dressed with [employer] and  then fluffs were applied.  Kerlix was wrapped around the leg and then gently an ACE wrap was applied.    COMPLICATIONS: None  CONDITION: Stable  Marty Heck, MD [facility] Office: [place]

## 2020-09-28 NOTE — Anesthesia Postprocedure Evaluation (Signed)
Anesthesia Post Note  Patient: Thomas Garcia  Procedure(s) Performed: RIGHT BELOW KNEE AMPUTATION (Right: Knee)     Patient location during evaluation: PACU Anesthesia Type: General Level of consciousness: awake Pain management: pain level controlled Vital Signs Assessment: post-procedure vital signs reviewed and stable Respiratory status: spontaneous breathing, nonlabored ventilation, respiratory function stable and patient connected to nasal cannula oxygen Cardiovascular status: blood pressure returned to baseline and stable Postop Assessment: no apparent nausea or vomiting Anesthetic complications: no   No notable events documented.  Last Vitals:  Vitals:   09/28/20 1650 09/28/20 1705  BP: 138/60 121/79  Pulse: (!) 109 91  Resp: (!) 22 (!) 25  Temp:  (!) 36.4 C  SpO2: 93% 99%    Last Pain:  Vitals:   09/28/20 1650  TempSrc:   PainSc: 5                  Caroleann Casler P Ygnacio Fecteau

## 2020-09-28 NOTE — Progress Notes (Signed)
[facility]     Objective (!) 135/59 100 99.5 F (37.5 C) (Oral) 18 98%  Intake/Output Summary (Last 24 hours) at 09/28/2020 1212 Last data filed at 09/28/2020 0435 Gross per 24 hour  Intake 980 ml  Output 650 ml  Net 330 ml         Laboratory Lab Results: Recent Labs    09/27/20 0210 09/28/20 0127  WBC 13.3* 13.6*  HGB 8.4* 8.2*  HCT 25.9* 26.1*  PLT 498* 560*   BMET Recent Labs    09/27/20 0210 09/28/20 0127  NA 136 132*  K 3.3* 3.1*  CL 100 98  CO2 25 27  GLUCOSE 104* 93  BUN 7* 8  CREATININE 0.50* 0.53*  CALCIUM 8.1* 8.0*    COAG Lab Results  Component Value Date   INR 1.0 07/07/2020   INR 1.1 06/26/2020   INR 1.1 09/08/2018   No results found for: PTT  Assessment/Planning:  Plan right BKA after previous discussion of options.    Marty Heck 09/28/2020 12:12 PM --

## 2020-09-28 NOTE — Plan of Care (Signed)
  Problem: Education: Goal: Knowledge of General Education information will improve Description: Including pain rating scale, medication(s)/side effects and non-pharmacologic comfort measures Outcome: Progressing   Problem: Health Behavior/Discharge Planning: Goal: Ability to manage health-related needs will improve Outcome: Progressing   Problem: Clinical Measurements: Goal: Ability to maintain clinical measurements within normal limits will improve Outcome: Progressing Goal: Will remain free from infection Outcome: Progressing Goal: Diagnostic test results will improve Outcome: Progressing Goal: Respiratory complications will improve Outcome: Progressing Goal: Cardiovascular complication will be avoided Outcome: Progressing   Problem: Activity: Goal: Risk for activity intolerance will decrease Outcome: Progressing   Patient resting in bed with eyes closed, semi-fowlers, call bell within reach, 3 side rails up, bed in low position, bed alarm on.  Problem: Nutrition: Goal: Adequate nutrition will be maintained Outcome: Progressing   Problem: Coping: Goal: Level of anxiety will decrease Outcome: Progressing   Problem: Elimination: Goal: Will not experience complications related to bowel motility Outcome: Progressing Goal: Will not experience complications related to urinary retention Outcome: Progressing   Problem: Pain Managment: Goal: General experience of comfort will improve Outcome: Progressing   Problem: Safety: Goal: Ability to remain free from injury will improve Outcome: Progressing   Problem: Skin Integrity: Goal: Risk for impaired skin integrity will decrease Outcome: Progressing

## 2020-09-28 NOTE — Progress Notes (Signed)
PROGRESS NOTE                                                                                                                                                                                                             Patient Demographics:    Thomas Garcia, is a 80 y.o. male, DOB - 06-25-1940, OIB:[id number]  Outpatient Primary MD for the patient is Doree Albee, MD   Admit date - 09/22/2020   LOS - 6  Chief Complaint  Patient presents with   Anemia       Brief Narrative: Patient is a 80 y.o. 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  See below for further details  COVID-19 vaccinated status: Vaccinated including booster.  Significant Events: 3/20-4/7>> hospitalization-lung mass-right occipital mass-s/p stereotactic resection-PE-no anticoagulation due to hemoptysis/bleeding from brain mass-IVC filter placed.  Goals of care done- DNR. 6/16>> Admit for symptomatic anemia-right leg ischemia  Significant studies: 6/16>> CT right tibia/fibula: No fracture/dislocation-no fluid collection or abscess. 6/19>> ABI: Right ABI 0.32-severe disease.  COVID-19 medications: None  Antibiotics: Ancef: 6/17>>  Microbiology data: 6/16 >>blood culture: No growth 6/18>> COVID PCR: Positive  Procedures: None  Consults: None  DVT prophylaxis: SCDs Start: 09/22/20 2148    Subjective:    Blayke Cordrey today was lying comfortably in bed-he did not have any chest pain or shortness of breath.   Assessment  & Plan :   Critical right leg ischemia: Continue Ancef-vascular surgery planning on BKA today.  Normocytic  anemia: No evidence of overt blood loss-although FOBT positive-anemia likely due to underlying malignancy/chronic disease.  Hemoglobin currently stable-has required a total of 3 units of PRBC so far.  Hypokalemia: Replete and recheck  COVID-19 infection: Incidental finding-no symptoms-on isolation for 10 days.  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    PAD with history of left BKA-Plavix on hold-remains on statin  History of pulmonary embolism-has IVC filter (placed on 3/29 by IR)  Goals of care: DNR-metastatic adenocarcinoma of the lung-has prior left BKA-now requiring right BKA-his overall prognosis is very poor.  Palliative care following and engaging with family.  Will await further recommendations.  Nutrition Problem: Nutrition Problem: Increased nutrient needs Etiology: wound healing Signs/Symptoms: estimated needs Interventions: Ensure Enlive (each supplement provides 350kcal and 20 grams of protein), Magic cup, Liberalize Diet, Education, Prostat, MVI  RN pressure injury documentation: Pressure Injury 09/23/20 Buttocks Right;Left;Mid Stage 1 -  Intact skin with non-blanchable redness of a localized area usually over a bony prominence. 8 X 6 CM redness to sacrum (Active)  09/23/20 0030  Location: Buttocks  Location Orientation: Right;Left;Mid  Staging: Stage 1 -  Intact skin with non-blanchable redness of a localized area usually over a bony prominence.  Wound Description (Comments): 8 X 6 CM redness to sacrum  Present on Admission: Yes    ABG:    Component Value Date/Time   PHART 7.355 07/08/2020 1042   PCO2ART 43.6 07/08/2020 1042    PO2ART 282 (H) 07/08/2020 1042   HCO3 24.8 07/08/2020 1042   TCO2 26 07/08/2020 1042   ACIDBASEDEF 1.0 07/08/2020 1042   O2SAT 100.0 07/08/2020 1042    Vent Settings: N/A    Condition - Stable  Family Communication  : None at bedside-will update family over the next few days.  Code Status :  Full Code  Diet :  Diet Order             Diet NPO time specified  Diet effective midnight                    Disposition Plan  :   Status is: Inpatient  Remains inpatient appropriate because:Inpatient level of care appropriate due to severity of illness  Dispo: The patient is from: Home              Anticipated d/c is to: SNF              Patient currently is not medically stable to d/c.   Difficult to place patient No   Barriers to discharge: Critical right leg ischemia-requiring BKA  Antimicorbials  :    Anti-infectives (From admission, onward)    Start     Dose/Rate Route Frequency Ordered Stop   09/23/20 0900  ceFAZolin (ANCEF) IVPB 2g/100 mL premix        2 g 200 mL/hr over 30 Minutes Intravenous Every 8 hours 09/23/20 0806     09/22/20 2130  cefTRIAXone (ROCEPHIN) 1 g in sodium chloride 0.9 % 100 mL IVPB  Status:  Discontinued        1 g 200 mL/hr over 30 Minutes Intravenous Every 24 hours 09/22/20 2126 09/23/20 0806       Inpatient Medications  Scheduled Meds:  (feeding supplement) PROSource Plus  30 mL Oral TID BM   atorvastatin  10 mg Oral Daily   feeding supplement  237 mL Oral BID BM   insulin aspart  0-5 Units Subcutaneous QHS   insulin aspart  0-9 Units Subcutaneous TID WC   levETIRAcetam  500 mg Oral BID   multivitamin  1 tablet Oral Daily   pantoprazole  40 mg Oral Daily   traZODone  50 mg Oral QHS   Continuous Infusions:   ceFAZolin (ANCEF) IV 2 g (09/28/20 0457)  PRN Meds:.acetaminophen **OR** acetaminophen, morphine injection, ondansetron **OR** ondansetron (ZOFRAN) IV, oxyCODONE   Time Spent in minutes  35    See all Orders from  today for further details   Oren Binet M.D on 09/28/2020 at 1:01 PM  To page go to [email or web address] - use universal password  [facility] -  Office  [phone]    Objective:   Vitals:   09/27/20 0453 09/27/20 1320 09/27/20 1947 09/28/20 0347  BP: (!) 105/54 (!) 143/61 (!) 108/53 (!) 135/59  Pulse: 96 97 100 100  Resp: 17 18    Temp: 98.8 F (37.1 C) 97.7 F (36.5 C) 98.1 F (36.7 C) 99.5 F (37.5 C)  TempSrc: Oral Axillary Oral Oral  SpO2: 99% 98% 99% 98%  Weight:      Height:        Wt Readings from Last 3 Encounters:  09/22/20 77.1 kg  09/21/20 77.1 kg  08/04/20 77.1 kg     Intake/Output Summary (Last 24 hours) at 09/28/2020 1301 Last data filed at 09/28/2020 0435 Gross per 24 hour  Intake 260 ml  Output 650 ml  Net -390 ml     Physical Exam Gen Exam:Alert awake-not in any distress HEENT:atraumatic, normocephalic Chest: B/L clear to auscultation anteriorly CVS:S1S2 regular Abdomen:soft non tender, non distended Extremities: Left BKA-right leg in dressing did not open. Neurology: Non focal Skin: no rash   Data Review:    CBC Recent Labs  Lab 09/22/20 1301 09/22/20 2203 09/23/20 2304 09/24/20 0052 09/26/20 0114 09/26/20 0737 09/27/20 0210 09/28/20 0127  WBC 15.4*   < > 21.5* 20.6* 15.8* 14.9* 13.3* 13.6*  HGB 5.6*   < > 7.5* 7.5* 6.8* 8.1* 8.4* 8.2*  HCT 20.2*   < > 24.2* 24.6* 22.3* 25.7* 25.9* 26.1*  PLT 667*   < > 522* 522* 519* 506* 498* 560*  MCV 78.9*   < > 78.8* 79.9* 79.4* 79.8* 78.7* 80.3  MCH 21.9*   < > 24.4* 24.4* 24.2* 25.2* 25.5* 25.2*  MCHC 27.7*   < > 31.0 30.5 30.5 31.5 32.4 31.4  RDW 21.2*   < > 19.1* 19.1* 20.1* 18.6* 18.8* 19.3*  LYMPHSABS 1.0  --  0.9  --   --   --   --   --   MONOABS 1.1*  --  1.6*  --   --   --   --   --   EOSABS 0.1  --  0.0  --   --   --   --   --   BASOSABS 0.1  --  0.0  --   --   --   --   --    < > = values in this interval not displayed.    Chemistries  Recent Labs  Lab  09/21/20 1426 09/22/20 1301 09/23/20 3818 09/24/20 0052 09/26/20 0114 09/27/20 0210 09/28/20 0127  NA 140   < > 138 135 134* 136 132*  K 4.8   < > 3.6 3.5 3.9 3.3* 3.1*  CL 105   < > 104 100 101 100 98  CO2 29   < > 28 27 27 25 27   GLUCOSE 149*   < > 122* 149* 209* 104* 93  BUN 12   < > 9 11 11  7* 8  CREATININE 0.60*   < > 0.52* 0.62 0.56* 0.50* 0.53*  CALCIUM 8.9   < > 8.0* 7.9* 7.8* 8.1* 8.0*  AST 5*  --   --   --   --   --   --  ALT 4*  --   --   --   --   --   --   BILITOT 0.2  --   --   --   --   --   --    < > = values in this interval not displayed.   ------------------------------------------------------------------------------------------------------------------ No results for input(s): CHOL, HDL, LDLCALC, TRIG, CHOLHDL, LDLDIRECT in the last 72 hours.  Lab Results  Component Value Date   HGBA1C 5.5 09/21/2020   ------------------------------------------------------------------------------------------------------------------ No results for input(s): TSH, T4TOTAL, T3FREE, THYROIDAB in the last 72 hours.  Invalid input(s): FREET3 ------------------------------------------------------------------------------------------------------------------ No results for input(s): VITAMINB12, FOLATE, FERRITIN, TIBC, IRON, RETICCTPCT in the last 72 hours.  Coagulation profile No results for input(s): INR, PROTIME in the last 168 hours.  No results for input(s): DDIMER in the last 72 hours.  Cardiac Enzymes No results for input(s): CKMB, TROPONINI, MYOGLOBIN in the last 168 hours.  Invalid input(s): CK ------------------------------------------------------------------------------------------------------------------ No results found for: BNP  Micro Results Recent Results (from the past 240 hour(s))  Culture, blood (routine x 2)     Status: None   Collection Time: 09/22/20 10:01 PM   Specimen: BLOOD  Result Value Ref Range Status   Specimen Description BLOOD RIGHT  ANTECUBITAL  Final   Special Requests   Final    BOTTLES DRAWN AEROBIC AND ANAEROBIC Blood Culture adequate volume   Culture   Final    NO GROWTH 5 DAYS Performed at [facility], [address] [place], [place] [address]    Report Status 09/27/2020 FINAL  Final  Culture, blood (routine x 2)     Status: None   Collection Time: 09/22/20 10:03 PM   Specimen: BLOOD  Result Value Ref Range Status   Specimen Description BLOOD LEFT ANTECUBITAL  Final   Special Requests   Final    BOTTLES DRAWN AEROBIC AND ANAEROBIC Blood Culture adequate volume   Culture   Final    NO GROWTH 5 DAYS Performed at [facility], [address] [place], [place] [address]    Report Status 09/27/2020 FINAL  Final  SARS CORONAVIRUS 2 (TAT 6-24 HRS) Nasopharyngeal Nasopharyngeal Swab     Status: Abnormal   Collection Time: 09/24/20  8:37 AM   Specimen: Nasopharyngeal Swab  Result Value Ref Range Status   SARS Coronavirus 2 POSITIVE (A) NEGATIVE Final    Comment: (NOTE) SARS-CoV-2 target nucleic acids are DETECTED.  The SARS-CoV-2 RNA is generally detectable in upper and lower respiratory specimens during the acute phase of infection. Positive results are indicative of the presence of SARS-CoV-2 RNA. Clinical correlation with patient history and other diagnostic information is  necessary to determine patient infection status. Positive results do not rule out bacterial infection or co-infection with other viruses.  The expected result is Negative.  Fact Sheet for Patients: SugarRoll.be  Fact Sheet for Healthcare Providers: https://www.woods-mathews.com/  This test is not yet approved or cleared by the [place] FDA and  has been authorized for detection and/or diagnosis of SARS-CoV-2 by FDA under an Emergency Use Authorization (EUA). This EUA will remain  in effect (meaning this test can be used) for the duration of the COVID-19 declaration under Section  564(b)(1) of the Act, 21 U. S.C. section 360bbb-3(b)(1), unless the authorization is terminated or revoked sooner.   Performed at [facility], [place] [address], [place], [place] [address]     Radiology Reports CT TIBIA FIBULA RIGHT W CONTRAST  Result Date: 09/22/2020 CLINICAL DATA:  80 year old  male with right lower extremity ulcer posteriorly. History of metastatic lung cancer. EXAM: CT OF THE LOWER RIGHT EXTREMITY WITH CONTRAST TECHNIQUE: Multidetector CT imaging of the lower right extremity was performed according to the standard protocol following intravenous contrast administration. CONTRAST:  100mL OMNIPAQUE IOHEXOL 300 MG/ML  SOLN COMPARISON:  None. FINDINGS: Bones/Joint/Cartilage There is no acute fracture or dislocation. 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 There is moderate degenerative changes of the right knee. Ligaments Suboptimally assessed by CT. Muscles and Tendons No intramuscular fluid collection or hematoma. Soft tissues There is ulceration of the posterior skin over the distal lower extremity. There is thickening of the skin and diffuse subcutaneous edema. Subcutaneous air noted in the region of the ulceration. No drainable fluid collection or abscess identified. IMPRESSION: 1. No acute fracture or dislocation. 2. Ulceration of the posterior skin over the distal lower extremity. No drainable fluid collection or abscess. Electronically Signed   By: Anner Crete M.D.   On: 09/22/2020 21:10   VAS [name] ABI WITH/WO TBI  Result Date: 09/25/2020  LOWER EXTREMITY DOPPLER STUDY Patient Name:  NATHANAL HERMIZ  Date of Exam:   09/25/2020 Medical Rec #: [id number]        Accession #:    [id number] Date of Birth: [birth year]        Patient Gender: M Patient Age:   80Y Exam Location:  [facility] Procedure:      VAS [name] ABI WITH/WO TBI Referring Phys: [id number] [place] --------------------------------------------------------------------------------  Indications: Ulceration, and peripheral  artery disease. High Risk Factors: Diabetes, past history of smoking. Other Factors: PAD, LLE BKA, lung CA with brain mets.  Comparison Study: Previous exam 03/10/19 RLE -ABI 0.52 TBI 0.52 Performing Technologist: Rogelia Rohrer RVT, RDMS  Examination Guidelines: A complete evaluation includes at minimum, Doppler waveform signals and systolic blood pressure reading at the level of bilateral brachial, anterior tibial, and posterior tibial arteries, when vessel segments are accessible. Bilateral testing is considered an integral part of a complete examination. Photoelectric Plethysmograph (PPG) waveforms and toe systolic pressure readings are included as required and additional duplex testing as needed. Limited examinations for reoccurring indications may be performed as noted.  ABI Findings: +--------+------------------+-----+-------------------+--------+ Right   Rt Pressure (mmHg)IndexWaveform           Comment  +--------+------------------+-----+-------------------+--------+ [id number]                    triphasic                   +--------+------------------+-----+-------------------+--------+ PTA     34                0.23 dampened monophasic         +--------+------------------+-----+-------------------+--------+ DP      47                0.32 dampened monophasic         +--------+------------------+-----+-------------------+--------+ +--------+------------------+-----+--------+-------+ Left    Lt Pressure (mmHg)IndexWaveformComment +--------+------------------+-----+--------+-------+ [id number]                    biphasic        +--------+------------------+-----+--------+-------+ +-------+-----------+-----------+------------+------------+ ABI/TBIToday's ABIToday's TBIPrevious ABIPrevious TBI +-------+-----------+-----------+------------+------------+ Right  0.32       0          0.52        0.52         +-------+-----------+-----------+------------+------------+  Right ABIs and TBIs appear decreased.  Summary: Right: Resting right ankle-brachial index indicates  severe right lower extremity arterial disease. 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 Left: LT BKA.  *See table(s) above for measurements and observations.  Electronically signed by Harold Barban MD on 09/25/2020 at 9:30:04 PM.    Final

## 2020-09-28 NOTE — Anesthesia Procedure Notes (Signed)
Procedure Name: Intubation Date/Time: 09/28/2020 2:24 PM Performed by: Janene Harvey, CRNA Pre-anesthesia Checklist: Patient identified, Emergency Drugs available, Suction available and Patient being monitored Patient Re-evaluated:Patient Re-evaluated prior to induction Oxygen Delivery Method: Circle system utilized Preoxygenation: Pre-oxygenation with 100% oxygen Induction Type: IV induction Ventilation: Mask ventilation without difficulty Laryngoscope Size: Mac and 4 Grade View: Grade I Tube type: Oral Tube size: 7.5 mm Number of attempts: 1 Airway Equipment and Method: Stylet and Oral airway Placement Confirmation: ETT inserted through vocal cords under direct vision, positive ETCO2 and breath sounds checked- equal and bilateral Secured at: 22 cm Tube secured with: Tape Dental Injury: Teeth and Oropharynx as per pre-operative assessment

## 2020-09-29 ENCOUNTER — Ambulatory Visit (HOSPITAL_COMMUNITY): Payer: Medicare HMO | Admitting: Hematology

## 2020-09-29 ENCOUNTER — Encounter (HOSPITAL_COMMUNITY): Payer: Self-pay | Admitting: Vascular Surgery

## 2020-09-29 LAB — BASIC METABOLIC PANEL
Anion gap: 7 (ref 5–15)
BUN: 8 mg/dL (ref 8–23)
CO2: 27 mmol/L (ref 22–32)
Calcium: 8.3 mg/dL — ABNORMAL LOW (ref 8.9–10.3)
Chloride: 100 mmol/L (ref 98–111)
Creatinine, Ser: 0.56 mg/dL — ABNORMAL LOW (ref 0.61–1.24)
GFR, Estimated: >60 mL/min (ref >60)
Glucose, Bld: 189 mg/dL — ABNORMAL HIGH (ref 70–99)
Potassium: 3.9 mmol/L (ref 3.5–5.1)
Sodium: 134 mmol/L — ABNORMAL LOW (ref 135–145)

## 2020-09-29 LAB — GLUCOSE, CAPILLARY
Glucose-Capillary: 179 mg/dL — ABNORMAL HIGH (ref 70–99)
Glucose-Capillary: 173 mg/dL — ABNORMAL HIGH (ref 70–99)
Glucose-Capillary: 115 mg/dL — ABNORMAL HIGH (ref 70–99)
Glucose-Capillary: 171 mg/dL — ABNORMAL HIGH (ref 70–99)

## 2020-09-29 LAB — CBC
HCT: 27.4 % — ABNORMAL LOW (ref 39.0–52.0)
Hemoglobin: 8.4 g/dL — ABNORMAL LOW (ref 13.0–17.0)
MCH: 25.1 pg — ABNORMAL LOW (ref 26.0–34.0)
MCHC: 30.7 g/dL (ref 30.0–36.0)
MCV: 82 fL (ref 80.0–100.0)
Platelets: 605 10*3/uL — ABNORMAL HIGH (ref 150–400)
RBC: 3.34 MIL/uL — ABNORMAL LOW (ref 4.22–5.81)
RDW: 19.9 % — ABNORMAL HIGH (ref 11.5–15.5)
WBC: 26.7 10*3/uL — ABNORMAL HIGH (ref 4.0–10.5)
nRBC: 0 % (ref 0.0–0.2)

## 2020-09-29 NOTE — Evaluation (Signed)
Physical Therapy Evaluation Patient Details Name: Thomas Garcia MRN: [id number] DOB: [birth year] Today's Date: 09/29/2020   History of Present Illness  80 y.o. male  admitted 6/16  from his PCP office with hemoglobin of 5.5. He reported weakness and pain from wounds on his right leg. 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 pt with past medical history of non-small cell lung cancer metastatic to brain, pulmonary embolism, anemia, PAD status post left BKA, uncontrolled diabetes mellitus, HTN, and GERD.  Clinical Impression  Pt was admitted due to wound on his R LE and underwent R BKA. Pt now with increased pain and unable to fully participate in evaluation. Pt is able to perform bed mobility with S and PT educated brother on transfers, brother verbalized understanding. Brother and pt state pt will d/c home and not SNF even with education. RN and MD informed of splint causing pain as well as keeping R residual limb in flexion. Pt and brother will benefit from skilled PT to address deficits in strength, pain, endurance,transfers and DME education to maximize independence with functional mobility prior to discharge.     Follow Up Recommendations SNF;Home health PT;Supervision/Assistance - 24 hour (has used Ben at [facility] in the past, wants him again)    Marine scientist bed    Recommendations for Other Services       Precautions / Restrictions Precautions Precautions: Fall Required Braces or Orthoses: Splint/Cast Splint/Cast: splint placed by ortho tech, upon arrival pt with complete knee flexion in splint. reached out to vascular regarding splint, unable so reached out to attending Restrictions Weight Bearing Restrictions: Yes RLE Weight Bearing: Non weight bearing LLE Weight Bearing: Non weight bearing      Mobility  Bed Mobility Overal bed mobility: Needs Assistance Bed Mobility: Supine to Sit;Sit to Supine;Rolling Rolling: Supervision   Supine to sit: Supervision;HOB elevated Sit  to supine: Supervision;HOB elevated   General bed mobility comments: performed rolling and supine<>sit with HOB elevated S; pt in pain and unable to proceed with any additional mobility    Transfers                 General transfer comment: education and demonstration to brother regarding ant-post transfer in and out of w/c education on using chuck pad to assist when needed. brother verbalized understanding  Ambulation/Gait                Stairs            Wheelchair Mobility    Modified Rankin (Stroke Patients Only)       Balance Overall balance assessment: Needs assistance Sitting-balance support: Single extremity supported Sitting balance-Leahy Scale: Fair                                       Pertinent Vitals/Pain Pain Assessment: 0-10 Pain Score: 10-Worst pain ever    Home Living Family/patient expects to be discharged to:: Private residence Living Arrangements: Other relatives Available Help at Discharge: Family;Available 24 hours/day Type of Home: Mobile home Home Access: Ramped entrance     Home Layout: One level Home Equipment: Walker - 2 wheels;Grab bars - toilet;Toilet riser;Bedside commode;Wheelchair - manual      Prior Function Level of Independence: Needs assistance   Gait / Transfers Assistance Needed: brother states for the last three months pt has been mostly bed bound with brother assisting with all ADLs and mobility tasks  Hand Dominance   Dominant Hand: Right    Extremity/Trunk Assessment   Upper Extremity Assessment Upper Extremity Assessment: Defer to OT evaluation    Lower Extremity Assessment Lower Extremity Assessment: RLE deficits/detail;LLE deficits/detail RLE: Unable to fully assess due to pain LLE Sensation: decreased light touch    Cervical / Trunk Assessment Cervical / Trunk Assessment: Kyphotic  Communication   Communication: No difficulties  Cognition Arousal/Alertness:  Awake/alert Behavior During Therapy: WFL for tasks assessed/performed Overall Cognitive Status: Within Functional Limits for tasks assessed                                 General Comments: pt in so much pain had difficulty answering questions, brother able to answer      General Comments      Exercises     Assessment/Plan    PT Assessment Patient needs continued PT services  PT Problem List Decreased mobility;Decreased safety awareness;Decreased coordination;Decreased range of motion;Decreased activity tolerance;Pain       PT Treatment Interventions Therapeutic exercise;DME instruction;Balance training;Functional mobility training;Therapeutic activities;Patient/family education    PT Goals (Current goals can be found in the Care Plan section)  Acute Rehab PT Goals Patient Stated Goal: to go home, absolutely no SNF or rehab PT Goal Formulation: With patient/family Time For Goal Achievement: 10/13/20 Potential to Achieve Goals: Fair    Frequency Min 3X/week   Barriers to discharge        Co-evaluation               AM-PAC PT "6 Clicks" Mobility  Outcome Measure Help needed turning from your back to your side while in a flat bed without using bedrails?: None Help needed moving from lying on your back to sitting on the side of a flat bed without using bedrails?: A Little Help needed moving to and from a bed to a chair (including a wheelchair)?: Total Help needed standing up from a chair using your arms (e.g., wheelchair or bedside chair)?: Total Help needed to walk in hospital room?: Total Help needed climbing 3-5 steps with a railing? : Total 6 Click Score: 11    End of Session   Activity Tolerance: Patient limited by pain Patient left: in bed;with family/visitor present;with call bell/phone within reach Nurse Communication: Mobility status PT Visit Diagnosis: Other abnormalities of gait and mobility (R26.89);Muscle weakness (generalized)  (M62.81);Pain Pain - Right/Left: Right Pain - part of body: Leg    Time: 1113-1130 PT Time Calculation (min) (ACUTE ONLY): 17 min   Charges:   PT Evaluation $PT Eval Moderate Complexity: 1 Mod          Lyanne Co, DPT Acute Rehabilitation Services [phone]   [place] 09/29/2020, 2:20 PM

## 2020-09-29 NOTE — Progress Notes (Signed)
PROGRESS NOTE                                                                                                                                                                                                             Patient Demographics:    Thomas Garcia, is a 80 y.o. male, DOB - 05-03-1940, NOI:[id number]  Outpatient Primary MD for the patient is Doree Albee, MD   Admit date - 09/22/2020   LOS - 7  Chief Complaint  Patient presents with   Anemia       Brief Narrative: Patient is a 80 y.o. 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  See below for further details  COVID-19 vaccinated status: Vaccinated including booster.  Significant Events: 3/20-4/7>> hospitalization-lung mass-right occipital mass-s/p stereotactic resection-PE-no anticoagulation due to hemoptysis/bleeding from brain mass-IVC filter placed.  Goals of care done- DNR. 6/16>> Admit for symptomatic anemia-right leg ischemia  Significant studies: 6/16>> CT right tibia/fibula: No fracture/dislocation-no fluid collection or abscess. 6/19>> ABI: Right ABI 0.32-severe disease.  COVID-19 medications: None  Antibiotics: Ancef: 6/17>>  Microbiology data: 6/16 >>blood culture: No growth 6/18>> COVID PCR: Positive  Procedures: 6/22>> right BKA  Consults: None  DVT prophylaxis: SCDs Start: 09/22/20 2148    Subjective:   No major issues overnight-lying comfortably in bed.  Pain seems to be reasonably well controlled per patient.   Assessment  & Plan :   Critical right leg ischemia: Evaluated by vascular surgery-underwent BKA on 6/22-stop  Ancef today.  Normocytic anemia: No evidence of overt blood loss-although FOBT positive-anemia likely due to underlying malignancy/chronic disease.  Hemoglobin remains stable-has required a total of 3 units of PRBC transfusion so far.    Hypokalemia: Repleted.  COVID-19 infection: Incidental finding-no symptoms-on isolation for 10 days.  DM-2 (A1c 5.5 on 6/15): CBG stable with SSI  Recent Labs    09/28/20 1940 09/29/20 0829 09/29/20 1155  GLUCAP 151* 179* 173*     Metastatic adenocarcinoma of the lung-with mets to brain-s/p stereotactic right occipital craniectomy for resection of mass on 4/1-s/p radiation treatments from 5/17-6/10: Remains on  Keppra-we will need outpatient follow-up with medical oncology.    PAD with history of left BKA-Plavix on hold-remains on statin  History of pulmonary embolism-has IVC filter (placed on 3/29 by IR)  Goals of care: DNR-metastatic adenocarcinoma of the lung-has prior left BKA-now requiring right BKA-his overall prognosis is very poor.  Palliative care following and engaging with family.  Will await further recommendations.  Nutrition Problem: Nutrition Problem: Increased nutrient needs Etiology: wound healing Signs/Symptoms: estimated needs Interventions: Ensure Enlive (each supplement provides 350kcal and 20 grams of protein), Magic cup, Liberalize Diet, Education, Prostat, MVI  RN pressure injury documentation: Pressure Injury 09/23/20 Buttocks Right;Left;Mid Stage 1 -  Intact skin with non-blanchable redness of a localized area usually over a bony prominence. 8 X 6 CM redness to sacrum (Active)  09/23/20 0030  Location: Buttocks  Location Orientation: Right;Left;Mid  Staging: Stage 1 -  Intact skin with non-blanchable redness of a localized area usually over a bony prominence.  Wound Description (Comments): 8 X 6 CM redness to sacrum  Present on Admission: Yes    ABG:    Component Value Date/Time   PHART 7.355 07/08/2020 1042    PCO2ART 43.6 07/08/2020 1042   PO2ART 282 (H) 07/08/2020 1042   HCO3 24.8 07/08/2020 1042   TCO2 26 07/08/2020 1042   ACIDBASEDEF 1.0 07/08/2020 1042   O2SAT 100.0 07/08/2020 1042    Vent Settings: N/A    Condition - Stable  Family Communication  : Brother-Charles Sadler-[phone]-called on 6/23-no answer.  Code Status :  Full Code  Diet :  Diet Order             Diet heart healthy/carb modified Room service appropriate? Yes; Fluid consistency: Thin  Diet effective now                    Disposition Plan  :   Status is: Inpatient  Remains inpatient appropriate because:Inpatient level of care appropriate due to severity of illness  Dispo: The patient is from: Home              Anticipated d/c is to: SNF              Patient currently is not medically stable to d/c.   Difficult to place patient No   Barriers to discharge: Critical right leg ischemia-s/p BKA on 6/22-dressing to be removed by vascular surgery on 6/24-we will need SNF.  Antimicorbials  :    Anti-infectives (From admission, onward)    Start     Dose/Rate Route Frequency Ordered Stop   09/23/20 0900  ceFAZolin (ANCEF) IVPB 2g/100 mL premix        2 g 200 mL/hr over 30 Minutes Intravenous Every 8 hours 09/23/20 0806     09/22/20 2130  cefTRIAXone (ROCEPHIN) 1 g in sodium chloride 0.9 % 100 mL IVPB  Status:  Discontinued        1 g 200 mL/hr over 30 Minutes Intravenous Every 24 hours 09/22/20 2126 09/23/20 0806       Inpatient Medications  Scheduled Meds:  (feeding supplement) PROSource Plus  30 mL Oral TID BM   atorvastatin  10 mg Oral Daily   feeding supplement  237 mL Oral BID BM   insulin aspart  0-5 Units Subcutaneous QHS   insulin aspart  0-9 Units Subcutaneous TID WC   levETIRAcetam  500 mg Oral BID   multivitamin  1 tablet Oral Daily   pantoprazole  40 mg Oral Daily  traZODone  50 mg Oral QHS   Continuous Infusions:   ceFAZolin (ANCEF) IV 2 g (09/29/20 0609)   PRN  Meds:.acetaminophen **OR** acetaminophen, morphine injection, ondansetron **OR** ondansetron (ZOFRAN) IV, oxyCODONE   Time Spent in minutes  25    See all Orders from today for further details   Oren Binet M.D on 09/29/2020 at 12:17 PM  To page go to [email or web address] - use universal password  [facility] -  Office  [phone]    Objective:   Vitals:   09/28/20 1705 09/28/20 2200 09/29/20 0600 09/29/20 0952  BP: 121/79 132/60 113/79 134/68  Pulse: 91 (!) 104 97 99  Resp: (!) 25 20 20 19   Temp: (!) 97.5 F (36.4 C) 99.1 F (37.3 C) 98.6 F (37 C) 98.2 F (36.8 C)  TempSrc:  Oral Oral Oral  SpO2: 99% 98% 97% 98%  Weight:      Height:        Wt Readings from Last 3 Encounters:  09/22/20 77.1 kg  09/21/20 77.1 kg  08/04/20 77.1 kg     Intake/Output Summary (Last 24 hours) at 09/29/2020 1217 Last data filed at 09/29/2020 1111 Gross per 24 hour  Intake 1530 ml  Output 750 ml  Net 780 ml      Physical Exam Gen Exam:Alert awake-not in any distress HEENT:atraumatic, normocephalic Chest: B/L clear to auscultation anteriorly CVS:S1S2 regular Abdomen:soft non tender, non distended Extremities: Bilateral BKA-right Neurology: Non focal Skin: no rash    Data Review:    CBC Recent Labs  Lab 09/22/20 1301 09/22/20 2203 09/23/20 2304 09/24/20 0052 09/26/20 0114 09/26/20 0737 09/27/20 0210 09/28/20 0127 09/29/20 0045  WBC 15.4*   < > 21.5*   < > 15.8* 14.9* 13.3* 13.6* 26.7*  HGB 5.6*   < > 7.5*   < > 6.8* 8.1* 8.4* 8.2* 8.4*  HCT 20.2*   < > 24.2*   < > 22.3* 25.7* 25.9* 26.1* 27.4*  PLT 667*   < > 522*   < > 519* 506* 498* 560* 605*  MCV 78.9*   < > 78.8*   < > 79.4* 79.8* 78.7* 80.3 82.0  MCH 21.9*   < > 24.4*   < > 24.2* 25.2* 25.5* 25.2* 25.1*  MCHC 27.7*   < > 31.0   < > 30.5 31.5 32.4 31.4 30.7  RDW 21.2*   < > 19.1*   < > 20.1* 18.6* 18.8* 19.3* 19.9*  LYMPHSABS 1.0  --  0.9  --   --   --   --   --   --   MONOABS 1.1*  --  1.6*  --   --    --   --   --   --   EOSABS 0.1  --  0.0  --   --   --   --   --   --   BASOSABS 0.1  --  0.0  --   --   --   --   --   --    < > = values in this interval not displayed.     Chemistries  Recent Labs  Lab 09/24/20 0052 09/26/20 0114 09/27/20 0210 09/28/20 0127 09/29/20 0045  NA 135 134* 136 132* 134*  K 3.5 3.9 3.3* 3.1* 3.9  CL 100 101 100 98 100  CO2 27 27 25 27 27   GLUCOSE 149* 209* 104* 93 189*  BUN 11 11 7* 8 8  CREATININE 0.62 0.56* 0.50* 0.53*  0.56*  CALCIUM 7.9* 7.8* 8.1* 8.0* 8.3*    ------------------------------------------------------------------------------------------------------------------ No results for input(s): CHOL, HDL, LDLCALC, TRIG, CHOLHDL, LDLDIRECT in the last 72 hours.  Lab Results  Component Value Date   HGBA1C 5.5 09/21/2020   ------------------------------------------------------------------------------------------------------------------ No results for input(s): TSH, T4TOTAL, T3FREE, THYROIDAB in the last 72 hours.  Invalid input(s): FREET3 ------------------------------------------------------------------------------------------------------------------ No results for input(s): VITAMINB12, FOLATE, FERRITIN, TIBC, IRON, RETICCTPCT in the last 72 hours.  Coagulation profile No results for input(s): INR, PROTIME in the last 168 hours.  No results for input(s): DDIMER in the last 72 hours.  Cardiac Enzymes No results for input(s): CKMB, TROPONINI, MYOGLOBIN in the last 168 hours.  Invalid input(s): CK ------------------------------------------------------------------------------------------------------------------ No results found for: BNP  Micro Results Recent Results (from the past 240 hour(s))  Culture, blood (routine x 2)     Status: None   Collection Time: 09/22/20 10:01 PM   Specimen: BLOOD  Result Value Ref Range Status   Specimen Description BLOOD RIGHT ANTECUBITAL  Final   Special Requests   Final    BOTTLES DRAWN AEROBIC  AND ANAEROBIC Blood Culture adequate volume   Culture   Final    NO GROWTH 5 DAYS Performed at [facility], [address], [place], [place] [address]    Report Status 09/27/2020 FINAL  Final  Culture, blood (routine x 2)     Status: None   Collection Time: 09/22/20 10:03 PM   Specimen: BLOOD  Result Value Ref Range Status   Specimen Description BLOOD LEFT ANTECUBITAL  Final   Special Requests   Final    BOTTLES DRAWN AEROBIC AND ANAEROBIC Blood Culture adequate volume   Culture   Final    NO GROWTH 5 DAYS Performed at [facility], [address], [place], [place] [address]    Report Status 09/27/2020 FINAL  Final  SARS CORONAVIRUS 2 (TAT 6-24 HRS) Nasopharyngeal Nasopharyngeal Swab     Status: Abnormal   Collection Time: 09/24/20  8:37 AM   Specimen: Nasopharyngeal Swab  Result Value Ref Range Status   SARS Coronavirus 2 POSITIVE (A) NEGATIVE Final    Comment: (NOTE) SARS-CoV-2 target nucleic acids are DETECTED.  The SARS-CoV-2 RNA is generally detectable in upper and lower respiratory specimens during the acute phase of infection. Positive results are indicative of the presence of SARS-CoV-2 RNA. Clinical correlation with patient history and other diagnostic information is  necessary to determine patient infection status. Positive results do not rule out bacterial infection or co-infection with other viruses.  The expected result is Negative.  Fact Sheet for Patients: SugarRoll.be  Fact Sheet for Healthcare Providers: https://www.woods-mathews.com/  This test is not yet approved or cleared by the [place] FDA and  has been authorized for detection and/or diagnosis of SARS-CoV-2 by FDA under an Emergency Use Authorization (EUA). This EUA will remain  in effect (meaning this test can be used) for the duration of the COVID-19 declaration under Section 564(b)(1) of the Act, 21 U. S.C. section 360bbb-3(b)(1), unless the  authorization is terminated or revoked sooner.   Performed at [facility], [place] [address] [place], [place] [address]     Radiology Reports CT TIBIA FIBULA RIGHT W CONTRAST  Result Date: 09/22/2020 CLINICAL DATA:  80 year old male with right lower extremity ulcer posteriorly. History of metastatic lung cancer. EXAM: CT OF THE LOWER RIGHT EXTREMITY WITH CONTRAST TECHNIQUE: Multidetector CT imaging of the lower right extremity was performed according to the standard protocol following intravenous contrast administration. CONTRAST:  72mL OMNIPAQUE IOHEXOL 300 MG/ML  SOLN COMPARISON:  None. FINDINGS: Bones/Joint/Cartilage There is no acute fracture or dislocation. 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 There is moderate degenerative changes of the right knee. Ligaments Suboptimally assessed by CT. Muscles and Tendons No intramuscular fluid collection or hematoma. Soft tissues There is ulceration of the posterior skin over the distal lower extremity. There is thickening of the skin and diffuse subcutaneous edema. Subcutaneous air noted in the region of the ulceration. No drainable fluid collection or abscess identified. IMPRESSION: 1. No acute fracture or dislocation. 2. Ulceration of the posterior skin over the distal lower extremity. No drainable fluid collection or abscess. Electronically Signed   By: Anner Crete M.D.   On: 09/22/2020 21:10   VAS [name] ABI WITH/WO TBI  Result Date: 09/25/2020  LOWER EXTREMITY DOPPLER STUDY Patient Name:  JERMAYNE SWEENEY  Date of Exam:   09/25/2020 Medical Rec #: [id number]        Accession #:    [id number] Date of Birth: 06/07/1940        Patient Gender: M Patient Age:   80Y Exam Location:  [facility] Procedure:      VAS [name] ABI WITH/WO TBI Referring Phys: [id number] [place] --------------------------------------------------------------------------------  Indications: Ulceration, and peripheral artery disease. High Risk Factors: Diabetes, past history of  smoking. Other Factors: PAD, LLE BKA, lung CA with brain mets.  Comparison Study: Previous exam 03/10/19 RLE -ABI 0.52 TBI 0.52 Performing Technologist: Rogelia Rohrer RVT, RDMS  Examination Guidelines: A complete evaluation includes at minimum, Doppler waveform signals and systolic blood pressure reading at the level of bilateral brachial, anterior tibial, and posterior tibial arteries, when vessel segments are accessible. Bilateral testing is considered an integral part of a complete examination. Photoelectric Plethysmograph (PPG) waveforms and toe systolic pressure readings are included as required and additional duplex testing as needed. Limited examinations for reoccurring indications may be performed as noted.  ABI Findings: +--------+------------------+-----+-------------------+--------+ Right   Rt Pressure (mmHg)IndexWaveform           Comment  +--------+------------------+-----+-------------------+--------+ [id number]                    triphasic                   +--------+------------------+-----+-------------------+--------+ PTA     34                0.23 dampened monophasic         +--------+------------------+-----+-------------------+--------+ DP      47                0.32 dampened monophasic         +--------+------------------+-----+-------------------+--------+ +--------+------------------+-----+--------+-------+ Left    Lt Pressure (mmHg)IndexWaveformComment +--------+------------------+-----+--------+-------+ [id number]                    biphasic        +--------+------------------+-----+--------+-------+ +-------+-----------+-----------+------------+------------+ ABI/TBIToday's ABIToday's TBIPrevious ABIPrevious TBI +-------+-----------+-----------+------------+------------+ Right  0.32       0          0.52        0.52         +-------+-----------+-----------+------------+------------+ Right ABIs and TBIs appear decreased.  Summary: Right: Resting  right ankle-brachial index indicates severe right lower extremity arterial disease. 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 Left: LT BKA.  *See table(s) above for measurements and observations.  Electronically signed by Harold Barban MD on 09/25/2020 at 9:30:04 PM.    Final

## 2020-09-29 NOTE — Progress Notes (Signed)
Orthopedic Tech Progress Note Patient Details:  Thomas Garcia Mar 22, 1941 [id number] Splint patient for comfort as is Ortho Devices Type of Ortho Device: Long leg splint Ortho Device/Splint Location: RLE Ortho Device/Splint Interventions: Application, Ordered   Post Interventions Patient Tolerated: Well  Thomas Garcia 09/29/2020, 10:28 AM

## 2020-09-29 NOTE — Evaluation (Signed)
Occupational Therapy Evaluation Patient Details Name: Thomas Garcia MRN: [id number] DOB: 18-Nov-1940 Today's Date: 09/29/2020    History of Present Illness 80 y.o. male  admitted 6/16  from his PCP office with hemoglobin of 5.5. He reported weakness and pain from wounds on his right leg. 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 pt with past medical history of non-small cell lung cancer metastatic to brain, pulmonary embolism, anemia, PAD status post left BKA, uncontrolled diabetes mellitus, HTN, and GERD.   Clinical Impression   Pt presents with decline in function and safety with ADLs and ADL mobility with impaired strength, balance and endurance. Eval limited by pt lethargy and pain. Pt's brother [name] present to answer questions and provide info. PTA pt lived at home with his brother and required assist with LB ADLs and mobility. Pt has 24/7 assist from his brother and wishes to return home vs SNF. Pt currently requires total A with alll ADLs/selfcare, mod A with bed mobility to attempt sup - sit EOB, but unable. Pt would benefit from acute OT services to address impairments to maximize level of function and safety    Follow Up Recommendations  SNF;Other (comment) (pt wants to return home with [facility] therapy)    [facility] bed    Recommendations for Other Services       Precautions / Restrictions Precautions Precautions: Fall Required Braces or Orthoses: Splint/Cast Splint/Cast: splint placed by ortho tech, upon arrival pt with complete knee flexion in splint. reached out to vascular regarding splint, unable so reached out to attending Restrictions Weight Bearing Restrictions: Yes RLE Weight Bearing: Non weight bearing LLE Weight Bearing: Non weight bearing      Mobility Bed Mobility Overal bed mobility: Needs Assistance Bed Mobility: Supine to Sit;Sit to Supine;Rolling Rolling: Supervision   Supine to sit: HOB elevated;Mod assist Sit to supine: Supervision;HOB  elevated   General bed mobility comments: attempted sup - sit with min A, however pt resistive retunring to supine due to pain, fatigue    Transfers                 General transfer comment: unable to attempt this session    Balance Overall balance assessment: Needs assistance Sitting-balance support: Single extremity supported Sitting balance-Leahy Scale: Fair Sitting balance - Comments: unbale to come to complete seated position                                   ADL either performed or assessed with clinical judgement   ADL Overall ADL's : Needs assistance/impaired Eating/Feeding: Set up;Supervision/ safety;Sitting;Bed level   Grooming: Wash/dry hands;Wash/dry face;Minimal assistance;Sitting;Bed level   Upper Body Bathing: Total assistance   Lower Body Bathing: Total assistance   Upper Body Dressing : Total assistance   Lower Body Dressing: Total assistance       Toileting- Clothing Manipulation and Hygiene: Total assistance;Bed level         General ADL Comments: pt limited by pain, lethargy/fatigue     Vision Baseline Vision/History: Wears glasses Patient Visual Report: No change from baseline       Perception     Praxis      Pertinent Vitals/Pain Pain Assessment: Faces Pain Score: 10-Worst pain ever Faces Pain Scale: Hurts even more Pain Location: generalized Pain Descriptors / Indicators: Grimacing Pain Intervention(s): Limited activity within patient's tolerance;Repositioned     Hand Dominance Right   Extremity/Trunk Assessment Upper Extremity Assessment  Upper Extremity Assessment: Generalized weakness   Lower Extremity Assessment Lower Extremity Assessment: Defer to PT evaluation RLE: Unable to fully assess due to pain LLE Sensation: decreased light touch   Cervical / Trunk Assessment Cervical / Trunk Assessment: Kyphotic   Communication Communication Communication: No difficulties   Cognition Arousal/Alertness:  Lethargic Behavior During Therapy: WFL for tasks assessed/performed Overall Cognitive Status: Within Functional Limits for tasks assessed                                 General Comments: pt lethargic, in pain with minimal bed mobility, Brother able to answer questions   General Comments       Exercises     Shoulder Instructions      Home Living Family/patient expects to be discharged to:: Private residence Living Arrangements: Other relatives;Other (Comment) (Brother [name]) Available Help at Discharge: Family;Available 24 hours/day Type of Home: Mobile home Home Access: Ramped entrance     Home Layout: One level     Bathroom Shower/Tub: Teacher, early years/pre: Standard     Home Equipment: Walker - 2 wheels;Grab bars - toilet;Toilet riser;Bedside commode;Wheelchair - manual          Prior Functioning/Environment Level of Independence: Needs assistance  Gait / Transfers Assistance Needed: brother states for the last three months pt has been mostly bed bound with brother assisting with all ADLs and mobility tasks ADL's / Homemaking Assistance Needed: brother assisted with all ADLs, pt able to feed, groom, toilet himself            OT Problem List: Decreased strength;Impaired balance (sitting and/or standing);Pain;Decreased activity tolerance;Decreased coordination      OT Treatment/Interventions: Self-care/ADL training;DME and/or AE instruction;Therapeutic activities;Patient/family education;Energy conservation    OT Goals(Current goals can be found in the care plan section) Acute Rehab OT Goals Patient Stated Goal: to go home, absolutely no SNF or rehab OT Goal Formulation: With patient/family Time For Goal Achievement: 10/13/20 Potential to Achieve Goals: Fair ADL Goals Pt Will Perform Grooming: with supervision;with set-up;sitting;with caregiver independent in assisting Pt Will Perform Upper Body Bathing: with mod assist;with min  assist;sitting;with caregiver independent in assisting Pt Will Perform Upper Body Dressing: with mod assist;with min assist;sitting;with caregiver independent in assisting Pt Will Transfer to Toilet: with max assist;with mod assist;anterior/posterior transfer;with transfer board Additional ADL Goal #1: Pt will complete bed mobility min - min guard A to sit EOB in prep for ADLs  OT Frequency: Min 2X/week   Barriers to D/C:            Co-evaluation              AM-PAC OT "6 Clicks" Daily Activity     Outcome Measure Help from another person eating meals?: A Little Help from another person taking care of personal grooming?: A Little Help from another person toileting, which includes using toliet, bedpan, or urinal?: Total Help from another person bathing (including washing, rinsing, drying)?: Total Help from another person to put on and taking off regular upper body clothing?: Total Help from another person to put on and taking off regular lower body clothing?: Total 6 Click Score: 10   End of Session    Activity Tolerance: Patient limited by fatigue;Patient limited by pain Patient left: in bed;with call bell/phone within reach;with bed alarm set;with family/visitor present  OT Visit Diagnosis: Other abnormalities of gait and mobility (R26.89);Muscle weakness (generalized) (M62.81);Pain Pain -  Right/Left: Right Pain - part of body: Leg                Time: 5525-8948 OT Time Calculation (min): 15 min Charges:  OT General Charges $OT Visit: 1 Visit OT Evaluation $OT Eval Moderate Complexity: 1 Mod    Britt Bottom 09/29/2020, 3:27 PM

## 2020-09-29 NOTE — Progress Notes (Addendum)
  Progress Note    09/29/2020 7:35 AM 1 Day Post-Op  Subjective:  No complaints   Vitals:   09/28/20 2200 09/29/20 0600  BP: 132/60 113/79  Pulse: (!) 104 97  Resp: 20 20  Temp: 99.1 F (37.3 C) 98.6 F (37 C)  SpO2: 98% 97%   Physical Exam: Lungs:  non labored Incisions:  bandage kept in place Abdomen:  soft, NT Neurologic: A&O  CBC    Component Value Date/Time   WBC 26.7 (H) 09/29/2020 0045   RBC 3.34 (L) 09/29/2020 0045   HGB 8.4 (L) 09/29/2020 0045   HCT 27.4 (L) 09/29/2020 0045   PLT 605 (H) 09/29/2020 0045   MCV 82.0 09/29/2020 0045   MCH 25.1 (L) 09/29/2020 0045   MCHC 30.7 09/29/2020 0045   RDW 19.9 (H) 09/29/2020 0045   LYMPHSABS 0.9 09/23/2020 2304   MONOABS 1.6 (H) 09/23/2020 2304   EOSABS 0.0 09/23/2020 2304   BASOSABS 0.0 09/23/2020 2304    BMET    Component Value Date/Time   NA 134 (L) 09/29/2020 0045   K 3.9 09/29/2020 0045   CL 100 09/29/2020 0045   CO2 27 09/29/2020 0045   GLUCOSE 189 (H) 09/29/2020 0045   BUN 8 09/29/2020 0045   CREATININE 0.56 (L) 09/29/2020 0045   CREATININE 0.60 (L) 09/21/2020 1426   CALCIUM 8.3 (L) 09/29/2020 0045   GFRNONAA >60 09/29/2020 0045   GFRNONAA 96 09/21/2020 1426   GFRAA 112 09/21/2020 1426    INR    Component Value Date/Time   INR 1.0 07/07/2020 1807     Intake/Output Summary (Last 24 hours) at 09/29/2020 0735 Last data filed at 09/28/2020 2257 Gross per 24 hour  Intake 1190 ml  Output 750 ml  Net 440 ml     Assessment/Plan:  80 y.o. male is s/p R BKA 1 Day Post-Op   Dressing down tomorrow Will order knee immobilizer to wear while in bed Staples out in 4 weeks   Dagoberto Ligas, PA-C [facility] [phone] 09/29/2020 7:35 AM   I have seen and evaluated the patient. I agree with the PA note as documented above.  Postop day 1 status post right BKA.  Dressing looks clean and dry.  We will remove dressing tomorrow.  Postop hemoglobin stable.  Pain seems reasonably  controlled.  Marty Heck, MD [facility] [place] Office: [phone]

## 2020-09-29 NOTE — TOC Initial Note (Signed)
Transition of Care [facility]) - Initial/Assessment Note    Patient Details  Name: Thomas Garcia MRN: [id number] Date of Birth: September 17, 1940  [facility]) CM/SW Contact:    Emeterio Reeve, [place] Phone Number: 09/29/2020, 5:27 PM  Clinical Narrative:                  CSW spoke to pt on phone. Pt states he was living at home with his brother PTA. Pt reports he will return home with his brother at DC. Pt reports he would like to resume HHPT services with [facility].   CSW will inform NCM.   Expected Discharge Plan: [place] Barriers to Discharge: Continued Medical Work up   Patient Goals and CMS Choice   CMS Medicare.gov Compare Post Acute Care list provided to:: Patient Choice offered to / list presented to : Patient, Sibling  Expected Discharge Plan and Services Expected Discharge Plan: [place]       Living arrangements for the past 2 months: Single Family Home                                      Prior Living Arrangements/Services Living arrangements for the past 2 months: Single Family Home Lives with:: Siblings Patient language and need for interpreter reviewed:: Yes Do you feel safe going back to the place where you live?: Yes      Need for Family Participation in Patient Care: Yes (Comment) Care giver support system in place?: Yes (comment) Current home services: DME Criminal Activity/Legal Involvement Pertinent to Current Situation/Hospitalization: No - Comment as needed  Activities of Daily Living Home Assistive Devices/Equipment: Prosthesis, Bedside commode/3-in-1, Wheelchair ADL Screening (condition at time of admission) Patient's cognitive ability adequate to safely complete daily activities?: No Is the patient deaf or have difficulty hearing?: No Does the patient have difficulty seeing, even when wearing glasses/contacts?: No Does the patient have difficulty concentrating, remembering, or making decisions?:  No Patient able to express need for assistance with ADLs?: Yes Does the patient have difficulty dressing or bathing?: Yes Independently performs ADLs?: No Communication: Independent Is this a change from baseline?: Pre-admission baseline Dressing (OT): Needs assistance Is this a change from baseline?: Pre-admission baseline Grooming: Needs assistance Is this a change from baseline?: Pre-admission baseline Feeding: Independent Bathing: Needs assistance Is this a change from baseline?: Pre-admission baseline Toileting: Needs assistance Is this a change from baseline?: Pre-admission baseline In/Out Bed: Needs assistance Is this a change from baseline?: Pre-admission baseline Walks in Home: Independent with device (comment) Does the patient have difficulty walking or climbing stairs?: Yes Weakness of Legs: Both Weakness of Arms/Hands: None  Permission Sought/Granted Permission sought to share information with : Chartered certified accountant granted to share information with : Yes, Verbal Permission Granted     Permission granted to share info w AGENCY: [facility]        Emotional Assessment Appearance:: Appears stated age Attitude/Demeanor/Rapport: Engaged Affect (typically observed): Appropriate Orientation: : Oriented to Self, Oriented to Place, Oriented to  Time, Oriented to Situation Alcohol / Substance Use: Not Applicable Psych Involvement: No (comment)  Admission diagnosis:  Right foot ulcer ([place]) [L97.519] Symptomatic anemia [D64.9] Acute anemia [D64.9] Acute on chronic anemia [D64.9] Patient Active Problem List   Diagnosis Date Noted   Heme + stool    Right foot ulcer ([place])    Acute on chronic  anemia 09/22/2020   Acute anemia 09/22/2020   Ulcer of leg, chronic, right ([place])    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/04/2020   Brain metastasis ([place])    Right-sided nontraumatic intracerebral hemorrhage (HCC)    Pulmonary embolism ([place])  06/27/2020   Brain mass 06/26/2020   Hyponatremia 06/26/2020   Microcytic anemia 06/26/2020   Cavitating mass in right upper lung lobe 05/27/2020   Colon cancer screening 06/22/2019   Abnormality of gait 11/06/2018   Diabetes mellitus type 2 in nonobese [facility])    Phantom limb pain (HCC)    Postoperative pain    Unilateral complete BKA, left, sequela (HCC)    Acute blood loss anemia    Essential hypertension    Neuropathic pain    Unilateral complete BKA, left, initial encounter ([place])    Tobacco abuse    Benign essential HTN    Diabetic peripheral neuropathy (HCC)    Post-operative pain    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HCC)    PVD (peripheral vascular disease) ([place])    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 ([place]) 09/08/2018   Osteomyelitis of second toe of left foot ([place]) 09/08/2018   Cellulitis of left foot 09/08/2018   Leukocytosis 09/08/2018   Fever and chills 09/08/2018   MOLE 10/26/2008   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 ([place]) 10/26/2008   OVERWEIGHT 10/26/2008   NICOTINE ADDICTION 10/26/2008   ACUTE CYSTITIS 10/26/2008   FATIGUE 10/26/2008   COUGH 10/26/2008   ELECTROCARDIOGRAM, ABNORMAL 10/26/2008   PCP:  Doree Albee, MD Pharmacy:   [facility] [address] [place] [address] [address] [place] [address] Phone: [phone] Fax: [phone]     Social Determinants of Health (SDOH) Interventions    Readmission Risk Interventions No flowsheet data found.  Emeterio Reeve, Latanya Presser, [place] Social Worker [phone]

## 2020-09-30 LAB — GLUCOSE, CAPILLARY
Glucose-Capillary: 125 mg/dL — ABNORMAL HIGH (ref 70–99)
Glucose-Capillary: 142 mg/dL — ABNORMAL HIGH (ref 70–99)
Glucose-Capillary: 242 mg/dL — ABNORMAL HIGH (ref 70–99)
Glucose-Capillary: 156 mg/dL — ABNORMAL HIGH (ref 70–99)

## 2020-09-30 LAB — BASIC METABOLIC PANEL
Anion gap: 4 — ABNORMAL LOW (ref 5–15)
BUN: 8 mg/dL (ref 8–23)
CO2: 27 mmol/L (ref 22–32)
Calcium: 7.6 mg/dL — ABNORMAL LOW (ref 8.9–10.3)
Chloride: 100 mmol/L (ref 98–111)
Creatinine, Ser: 0.54 mg/dL — ABNORMAL LOW (ref 0.61–1.24)
GFR, Estimated: >60 mL/min (ref >60)
Glucose, Bld: 122 mg/dL — ABNORMAL HIGH (ref 70–99)
Potassium: 3.5 mmol/L (ref 3.5–5.1)
Sodium: 131 mmol/L — ABNORMAL LOW (ref 135–145)

## 2020-09-30 LAB — CBC
HCT: 23.7 % — ABNORMAL LOW (ref 39.0–52.0)
Hemoglobin: 7.3 g/dL — ABNORMAL LOW (ref 13.0–17.0)
MCH: 24.9 pg — ABNORMAL LOW (ref 26.0–34.0)
MCHC: 30.8 g/dL (ref 30.0–36.0)
MCV: 80.9 fL (ref 80.0–100.0)
Platelets: 537 10*3/uL — ABNORMAL HIGH (ref 150–400)
RBC: 2.93 MIL/uL — ABNORMAL LOW (ref 4.22–5.81)
RDW: 20.2 % — ABNORMAL HIGH (ref 11.5–15.5)
WBC: 12 10*3/uL — ABNORMAL HIGH (ref 4.0–10.5)
nRBC: 0.2 % (ref 0.0–0.2)

## 2020-09-30 LAB — SURGICAL PATHOLOGY

## 2020-09-30 MED ORDER — SODIUM CHLORIDE 0.9 % IV SOLN: INTRAVENOUS | Status: AC

## 2020-09-30 NOTE — Progress Notes (Signed)
Pt very sad today until his brother came to visit.  Pt has been cold all day. He removes gown about an hour after a new one is placed on him. I took warm blankets in each time I rounded on him.  Nightshift RN stated pt refused pain meds throughout the night. Pt asked for pain meds throughout dayshift as needed.  He tried to take naps when he could.  No BM today.  Called MD and received new orders for NS when pt BP decreased and pulse increased d/t pt not eating/drinking much today.  Sacral wound is stage 1--intact, pink/red/non-blanchable. Placed a new foam pad on sacrum.  He has a lump in the middle of his thoracic spine, not painful.  His right BKA was very painful today and one time the muscles spasmed an his right leg kicked up in the air for about 15 seconds.

## 2020-09-30 NOTE — Progress Notes (Addendum)
  Progress Note    09/30/2020 7:46 AM 2 Days Post-Op  Subjective:  pain overnight   Vitals:   09/29/20 2300 09/30/20 0700  BP: (!) 130/53 135/64  Pulse: 76 97  Resp: 19 17  Temp: 99.3 F (37.4 C) 99.1 F (37.3 C)  SpO2: 98% 95%   Physical Exam: Lungs:  non labored Incisions:  R BKA incision c/d/I; posterior flap somewhat erythematous Neurologic: A&O  CBC    Component Value Date/Time   WBC 12.0 (H) 09/30/2020 0645   RBC 2.93 (L) 09/30/2020 0645   HGB 7.3 (L) 09/30/2020 0645   HCT 23.7 (L) 09/30/2020 0645   PLT 537 (H) 09/30/2020 0645   MCV 80.9 09/30/2020 0645   MCH 24.9 (L) 09/30/2020 0645   MCHC 30.8 09/30/2020 0645   RDW 20.2 (H) 09/30/2020 0645   LYMPHSABS 0.9 09/23/2020 2304   MONOABS 1.6 (H) 09/23/2020 2304   EOSABS 0.0 09/23/2020 2304   BASOSABS 0.0 09/23/2020 2304    BMET    Component Value Date/Time   NA 131 (L) 09/30/2020 0645   K 3.5 09/30/2020 0645   CL 100 09/30/2020 0645   CO2 27 09/30/2020 0645   GLUCOSE 122 (H) 09/30/2020 0645   BUN 8 09/30/2020 0645   CREATININE 0.54 (L) 09/30/2020 0645   CREATININE 0.60 (L) 09/21/2020 1426   CALCIUM 7.6 (L) 09/30/2020 0645   GFRNONAA >60 09/30/2020 0645   GFRNONAA 96 09/21/2020 1426   GFRAA 112 09/21/2020 1426    INR    Component Value Date/Time   INR 1.0 07/07/2020 1807     Intake/Output Summary (Last 24 hours) at 09/30/2020 0746 Last data filed at 09/29/2020 1300 Gross per 24 hour  Intake 540 ml  Output --  Net 540 ml     Assessment/Plan:  80 y.o. male is s/p R BKA 2 Days Post-Op   R BKA dressing changed this morning; posterior flap redness, continue to monitor Hgb 7.3; will defer transfusion to primary team Dispo: plan to return home with [place], PA-C [facility] [phone] 09/30/2020 7:46 AM  I have seen and evaluated the patient. I agree with the PA note as documented above.  Dressing changed today and will have to continue to monitor right  BKA flap given very edematous and woody tissue that was somewhat marginal for flap creation.  I have ordered limb protector to prevent contracture and also protect him from falling.  Marty Heck, MD [facility] [place] Office: [phone]

## 2020-09-30 NOTE — Progress Notes (Addendum)
PROGRESS NOTE                                                                                                                                                                                                             Patient Demographics:    Thomas Garcia, is a 80 y.o. male, DOB - [birth year], EXB:[id number]  Outpatient Primary MD for the patient is Doree Albee, MD   Admit date - 09/22/2020   LOS - 8  Chief Complaint  Patient presents with   Anemia       Brief Narrative: Patient is a 80 y.o. 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  See below for further details  COVID-19 vaccinated status: Vaccinated including booster.  Significant Events: 3/20-4/7>> hospitalization-lung mass-right occipital mass-s/p stereotactic resection-PE-no anticoagulation due to hemoptysis/bleeding from brain mass-IVC filter placed.  Goals of care done- DNR. 6/16>> Admit for symptomatic anemia-right leg ischemia  Significant studies: 6/16>> CT right tibia/fibula: No fracture/dislocation-no fluid collection or abscess. 6/19>> ABI: Right ABI 0.32-severe disease.  COVID-19 medications: None  Antibiotics: Ancef: 6/17>>6/23  Microbiology data: 6/16 >>blood culture: No growth 6/18>> COVID PCR: Positive  Procedures: 6/22>> right BKA  Consults: Vascular surgery, gastroenterology  DVT prophylaxis: SCDs Start: 09/22/20 2148    Subjective:   Lying comfortably in bed-no major issues overnight.   Assessment  & Plan :   Critical right leg ischemia: Evaluated by vascular surgery-underwent BKA on 6/22-no longer on Ancef.  Patient  prefers to go home with his brother rather than SNF.  Will need maximal home health services on discharge.  Normocytic anemia: No evidence of overt blood loss-although FOBT positive-anemia likely due to underlying malignancy/chronic disease.  Some drop in hemoglobin overnight-we will repeat CBC tomorrow to ensure stability before consideration of discharge.  Has required a total of 3 units of PRBC transfusion so far.  Was evaluated by GI-patient declined any endoscopy procedures.  Hypokalemia: Repleted.  COVID-19 infection: Incidental finding-no symptoms-on isolation for 10 days.  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    PAD with history of left BKA-Plavix on hold given severity of anemia-remains on statin  History of pulmonary embolism-has IVC filter (placed on 3/29 by IR)  Goals of care: DNR-metastatic adenocarcinoma of the lung-has prior left BKA-now requiring right BKA-his overall prognosis is very poor.  Palliative care following and engaging with family.  Will await further recommendations.  Nutrition Problem: Nutrition Problem: Increased nutrient needs Etiology: wound healing Signs/Symptoms: estimated needs Interventions: Ensure Enlive (each supplement provides 350kcal and 20 grams of protein), Magic cup, Liberalize Diet, Education, Prostat, MVI  RN pressure injury documentation: Pressure Injury 09/23/20 Buttocks Right;Left;Mid Stage 1 -  Intact skin with non-blanchable redness of a localized area usually over a bony prominence. 8 X 6 CM redness to sacrum (Active)  09/23/20 0030  Location: Buttocks  Location Orientation: Right;Left;Mid   Staging: Stage 1 -  Intact skin with non-blanchable redness of a localized area usually over a bony prominence.  Wound Description (Comments): 8 X 6 CM redness to sacrum  Present on Admission: Yes    ABG:    Component Value Date/Time   PHART 7.355 07/08/2020 1042   PCO2ART 43.6 07/08/2020 1042   PO2ART 282 (H) 07/08/2020 1042   HCO3 24.8 07/08/2020 1042   TCO2 26 07/08/2020 1042   ACIDBASEDEF 1.0 07/08/2020 1042   O2SAT 100.0 07/08/2020 1042    Vent Settings: N/A    Condition - Stable  Family Communication  : Brother-Charles Cooter-[phone]-called on 6/24-no answer.  Code Status :  Full Code  Diet :  Diet Order             Diet heart healthy/carb modified Room service appropriate? Yes; Fluid consistency: Thin  Diet effective now                    Disposition Plan  :   Status is: Inpatient  Remains inpatient appropriate because:Inpatient level of care appropriate due to severity of illness  Dispo: The patient is from: Home              Anticipated d/c is to:  Home with home health-social worker confirmed with brother on 6/23              Patient currently is not medically stable to d/c.   Difficult to place patient No   Barriers to discharge: S/p right BKA on 6/22-now with dropping hemoglobin-needs continued inpatient monitoring to ensure stability of hemoglobin.  Antimicorbials  :    Anti-infectives (From admission, onward)    Start     Dose/Rate Route Frequency Ordered Stop   09/23/20 0900  ceFAZolin (ANCEF) IVPB 2g/100 mL premix  Status:  Discontinued        2 g 200 mL/hr over 30 Minutes Intravenous Every 8 hours 09/23/20 0806 09/30/20 0723   09/22/20 2130  cefTRIAXone (ROCEPHIN) 1 g in sodium chloride 0.9 % 100 mL IVPB  Status:  Discontinued        1 g 200 mL/hr over 30 Minutes Intravenous Every 24 hours 09/22/20 2126 09/23/20 0806       Inpatient Medications  Scheduled Meds:  (feeding supplement) PROSource Plus  30 mL Oral TID BM    atorvastatin  10 mg Oral Daily  feeding supplement  237 mL Oral BID BM   insulin aspart  0-5 Units Subcutaneous QHS   insulin aspart  0-9 Units Subcutaneous TID WC   levETIRAcetam  500 mg Oral BID   multivitamin  1 tablet Oral Daily   pantoprazole  40 mg Oral Daily   traZODone  50 mg Oral QHS   Continuous Infusions:   PRN Meds:.acetaminophen **OR** acetaminophen, morphine injection, ondansetron **OR** ondansetron (ZOFRAN) IV, oxyCODONE   Time Spent in minutes  25    See all Orders from today for further details   Oren Binet M.D on 09/30/2020 at 1:15 PM  To page go to [email or web address] - use universal password  [facility] -  Office  [phone]    Objective:   Vitals:   09/29/20 1711 09/29/20 2300 09/30/20 0700 09/30/20 1310  BP: 110/79 (!) 130/53 135/64 (!) 104/47  Pulse: 91 76 97 (!) 107  Resp: 19 19 17 18   Temp: 99.1 F (37.3 C) 99.3 F (37.4 C) 99.1 F (37.3 C) 98.1 F (36.7 C)  TempSrc: Oral Oral Oral Oral  SpO2: 99% 98% 95% 96%  Weight:      Height:        Wt Readings from Last 3 Encounters:  09/22/20 77.1 kg  09/21/20 77.1 kg  08/04/20 77.1 kg     Intake/Output Summary (Last 24 hours) at 09/30/2020 1315 Last data filed at 09/30/2020 1056 Gross per 24 hour  Intake 150 ml  Output 700 ml  Net -550 ml      Physical Exam Gen Exam:Alert awake-not in any distress HEENT:atraumatic, normocephalic Chest: B/L clear to auscultation anteriorly CVS:S1S2 regular Abdomen:soft non tender, non distended Extremities: Bilateral BKA Neurology: Non focal Skin: no rash    Data Review:    CBC Recent Labs  Lab 09/23/20 2304 09/24/20 0052 09/26/20 0737 09/27/20 0210 09/28/20 0127 09/29/20 0045 09/30/20 0645  WBC 21.5*   < > 14.9* 13.3* 13.6* 26.7* 12.0*  HGB 7.5*   < > 8.1* 8.4* 8.2* 8.4* 7.3*  HCT 24.2*   < > 25.7* 25.9* 26.1* 27.4* 23.7*  PLT 522*   < > 506* 498* 560* 605* 537*  MCV 78.8*   < > 79.8* 78.7* 80.3 82.0 80.9  MCH 24.4*   < >  25.2* 25.5* 25.2* 25.1* 24.9*  MCHC 31.0   < > 31.5 32.4 31.4 30.7 30.8  RDW 19.1*   < > 18.6* 18.8* 19.3* 19.9* 20.2*  LYMPHSABS 0.9  --   --   --   --   --   --   MONOABS 1.6*  --   --   --   --   --   --   EOSABS 0.0  --   --   --   --   --   --   BASOSABS 0.0  --   --   --   --   --   --    < > = values in this interval not displayed.     Chemistries  Recent Labs  Lab 09/26/20 0114 09/27/20 0210 09/28/20 0127 09/29/20 0045 09/30/20 0645  NA 134* 136 132* 134* 131*  K 3.9 3.3* 3.1* 3.9 3.5  CL 101 100 98 100 100  CO2 27 25 27 27 27   GLUCOSE 209* 104* 93 189* 122*  BUN 11 7* 8 8 8   CREATININE 0.56* 0.50* 0.53* 0.56* 0.54*  CALCIUM 7.8* 8.1* 8.0* 8.3* 7.6*    ------------------------------------------------------------------------------------------------------------------ No results for input(s): CHOL,  HDL, LDLCALC, TRIG, CHOLHDL, LDLDIRECT in the last 72 hours.  Lab Results  Component Value Date   HGBA1C 5.5 09/21/2020   ------------------------------------------------------------------------------------------------------------------ No results for input(s): TSH, T4TOTAL, T3FREE, THYROIDAB in the last 72 hours.  Invalid input(s): FREET3 ------------------------------------------------------------------------------------------------------------------ No results for input(s): VITAMINB12, FOLATE, FERRITIN, TIBC, IRON, RETICCTPCT in the last 72 hours.  Coagulation profile No results for input(s): INR, PROTIME in the last 168 hours.  No results for input(s): DDIMER in the last 72 hours.  Cardiac Enzymes No results for input(s): CKMB, TROPONINI, MYOGLOBIN in the last 168 hours.  Invalid input(s): CK ------------------------------------------------------------------------------------------------------------------ No results found for: BNP  Micro Results Recent Results (from the past 240 hour(s))  Culture, blood (routine x 2)     Status: None   Collection Time:  09/22/20 10:01 PM   Specimen: BLOOD  Result Value Ref Range Status   Specimen Description BLOOD RIGHT ANTECUBITAL  Final   Special Requests   Final    BOTTLES DRAWN AEROBIC AND ANAEROBIC Blood Culture adequate volume   Culture   Final    NO GROWTH 5 DAYS Performed at [facility], [address], [place], [place] [address]    Report Status 09/27/2020 FINAL  Final  Culture, blood (routine x 2)     Status: None   Collection Time: 09/22/20 10:03 PM   Specimen: BLOOD  Result Value Ref Range Status   Specimen Description BLOOD LEFT ANTECUBITAL  Final   Special Requests   Final    BOTTLES DRAWN AEROBIC AND ANAEROBIC Blood Culture adequate volume   Culture   Final    NO GROWTH 5 DAYS Performed at [facility], [address], [place], [place] [address]    Report Status 09/27/2020 FINAL  Final  SARS CORONAVIRUS 2 (TAT 6-24 HRS) Nasopharyngeal Nasopharyngeal Swab     Status: Abnormal   Collection Time: 09/24/20  8:37 AM   Specimen: Nasopharyngeal Swab  Result Value Ref Range Status   SARS Coronavirus 2 POSITIVE (A) NEGATIVE Final    Comment: (NOTE) SARS-CoV-2 target nucleic acids are DETECTED.  The SARS-CoV-2 RNA is generally detectable in upper and lower respiratory specimens during the acute phase of infection. Positive results are indicative of the presence of SARS-CoV-2 RNA. Clinical correlation with patient history and other diagnostic information is  necessary to determine patient infection status. Positive results do not rule out bacterial infection or co-infection with other viruses.  The expected result is Negative.  Fact Sheet for Patients: SugarRoll.be  Fact Sheet for Healthcare Providers: https://www.woods-mathews.com/  This test is not yet approved or cleared by the [place] FDA and  has been authorized for detection and/or diagnosis of SARS-CoV-2 by FDA under an Emergency Use Authorization (EUA). This EUA will remain  in  effect (meaning this test can be used) for the duration of the COVID-19 declaration under Section 564(b)(1) of the Act, 21 U. S.C. section 360bbb-3(b)(1), unless the authorization is terminated or revoked sooner.   Performed at [facility], [place] [address], [place], [place] [address]     Radiology Reports CT TIBIA FIBULA RIGHT W CONTRAST  Result Date: 09/22/2020 CLINICAL DATA:  80 year old male with right lower extremity ulcer posteriorly. History of metastatic lung cancer. EXAM: CT OF THE LOWER RIGHT EXTREMITY WITH CONTRAST TECHNIQUE: Multidetector CT imaging of the lower right extremity was performed according to the standard protocol following intravenous contrast administration. CONTRAST:  57mL OMNIPAQUE IOHEXOL 300 MG/ML  SOLN COMPARISON:  None. FINDINGS: Bones/Joint/Cartilage There is no acute fracture  or dislocation. 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 There is moderate degenerative changes of the right knee. Ligaments Suboptimally assessed by CT. Muscles and Tendons No intramuscular fluid collection or hematoma. Soft tissues There is ulceration of the posterior skin over the distal lower extremity. There is thickening of the skin and diffuse subcutaneous edema. Subcutaneous air noted in the region of the ulceration. No drainable fluid collection or abscess identified. IMPRESSION: 1. No acute fracture or dislocation. 2. Ulceration of the posterior skin over the distal lower extremity. No drainable fluid collection or abscess. Electronically Signed   By: Anner Crete M.D.   On: 09/22/2020 21:10   VAS [name] ABI WITH/WO TBI  Result Date: 09/25/2020  LOWER EXTREMITY DOPPLER STUDY Patient Name:  ZAHIR EISENHOUR  Date of Exam:   09/25/2020 Medical Rec #: [id number]        Accession #:    [id number] Date of Birth: [birth year]        Patient Gender: M Patient Age:   80Y Exam Location:  [facility] Procedure:      VAS [name] ABI WITH/WO TBI Referring Phys: [id number] [place]  --------------------------------------------------------------------------------  Indications: Ulceration, and peripheral artery disease. High Risk Factors: Diabetes, past history of smoking. Other Factors: PAD, LLE BKA, lung CA with brain mets.  Comparison Study: Previous exam 03/10/19 RLE -ABI 0.52 TBI 0.52 Performing Technologist: Rogelia Rohrer RVT, RDMS  Examination Guidelines: A complete evaluation includes at minimum, Doppler waveform signals and systolic blood pressure reading at the level of bilateral brachial, anterior tibial, and posterior tibial arteries, when vessel segments are accessible. Bilateral testing is considered an integral part of a complete examination. Photoelectric Plethysmograph (PPG) waveforms and toe systolic pressure readings are included as required and additional duplex testing as needed. Limited examinations for reoccurring indications may be performed as noted.  ABI Findings: +--------+------------------+-----+-------------------+--------+ Right   Rt Pressure (mmHg)IndexWaveform           Comment  +--------+------------------+-----+-------------------+--------+ [id number]                    triphasic                   +--------+------------------+-----+-------------------+--------+ PTA     34                0.23 dampened monophasic         +--------+------------------+-----+-------------------+--------+ DP      47                0.32 dampened monophasic         +--------+------------------+-----+-------------------+--------+ +--------+------------------+-----+--------+-------+ Left    Lt Pressure (mmHg)IndexWaveformComment +--------+------------------+-----+--------+-------+ [id number]                    biphasic        +--------+------------------+-----+--------+-------+ +-------+-----------+-----------+------------+------------+ ABI/TBIToday's ABIToday's TBIPrevious ABIPrevious TBI +-------+-----------+-----------+------------+------------+  Right  0.32       0          0.52        0.52         +-------+-----------+-----------+------------+------------+ Right ABIs and TBIs appear decreased.  Summary: Right: Resting right ankle-brachial index indicates severe right lower extremity arterial disease. 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 Left: LT BKA.  *See table(s) above for measurements and observations.  Electronically signed by Harold Barban MD on 09/25/2020 at 9:30:04 PM.    Final

## 2020-09-30 NOTE — Care Management (Addendum)
See previous TOC member note. Cory with [name] has accepted referral for HHPT,OT,RN, SW and aide.    NCM called patient on hospital phone and spoke to hime and his brother. Confirmed he does not want SNF and his brother is in agreement. He wants to [facility] with [facility] . PT /OT recommending a hospital bed , patient does not want hospital bed at this time. Explained if he changes his mind before discharge to let nurse know, after discharge [name] / PCP can assist in getting hospital bed. Patient and brother both voiced understanding

## 2020-09-30 NOTE — Progress Notes (Signed)
Nutrition Follow-up  DOCUMENTATION CODES:   Not applicable  INTERVENTION:   Ensure Enlive po BID, each supplement provides 350 kcal and 20 grams of protein  30 ml ProSource Plus BID, each supplement provides 100 kcals and 15 grams protein.   Continue MVI with Minerals   NUTRITION DIAGNOSIS:   Increased nutrient needs related to wound healing as evidenced by estimated needs.  GOAL:   Patient will meet greater than or equal to 90% of their needs  Being addressed  MONITOR:   Supplement acceptance, PO intake, Labs, Skin, Weight trends  REASON FOR ASSESSMENT:   Malnutrition Screening Tool    ASSESSMENT:   patient is a 80 yo male with history of DM2, Metastatic adenocarcinoma of lung to brain (XRT to brain 5/19-5/25), left BKA, acute on chronic anemia (received 2 units PRBC), right lower extremity pain/ ulceration/ischemia.  COVID+  6/16 Admitted with symptomatic anemia with right leg ischemia 6/22 R. BKA  Recorded po intake 0-100%  Pt now with R. BKA with previous L BKA  Labs: sodium 131 (L) Meds: ss novolog, Prosight MVI   Diet Order:   Diet Order             Diet heart healthy/carb modified Room service appropriate? 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  Estimated Nutritional Needs:   Kcal:  9357-0177  Protein:  97-110  Fluid:  > 2liters daily   Kerman Passey MS, RDN, LDN, CNSC Registered Dietitian III Clinical Nutrition RD Pager and On-Call Pager Number Located in  [place]

## 2020-09-30 NOTE — Progress Notes (Signed)
Physical Therapy Treatment Patient Details Name: Thomas Garcia MRN: [id number] DOB: 10-29-1940 Today's Date: 09/30/2020    History of Present Illness 80 y.o. male  admitted 6/16  from his PCP office with hemoglobin of 5.5. He reported weakness and pain from wounds on his right leg. 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 pt with past medical history of non-small cell lung cancer metastatic to brain, pulmonary embolism, anemia, PAD status post left BKA, uncontrolled diabetes mellitus, HTN, and GERD.    PT Comments    Pt with little to no progress - limited due to pain, self limiting, and comorbidities (lung CA with brain mets).  Pt with little participate despite encouragement from therapist and brother. Focused on UE strengthening, education, and bed mobility/transfers.  Limited focus on BKA other than positioning/pressure relief due to pain and pt is not candidate for prosthesis - did not feel it was beneficial to stretch/exercise BKA site and cause increased pain when he is not candidate to use leg functionally and with poor prognosis.  He does continue to keep knee flexed - encouraged trying to straighten as able. Did note new order for residual limb protector but it has not been delivered yet.      Follow Up Recommendations  SNF (but family declines so needs 24 hr support and HHPT (has used [facility] with [facility] in past))     [facility] bed    Recommendations for Other Services       Precautions / Restrictions Precautions Precautions: Fall Required Braces or Orthoses: Other Brace Other Brace: Noted new orders for limb protector on 6/24, has not arrived yet Restrictions RLE Weight Bearing: Non weight bearing LLE Weight Bearing: Non weight bearing    Mobility  Bed Mobility Overal bed mobility: Needs Assistance             General bed mobility comments: Pt long sitting in bed at arrival    Transfers Overall transfer level: Needs assistance   Transfers:  Anterior-Posterior Transfer       Anterior-Posterior transfers: Total assist   General transfer comment: Pt declined any OOB activity.  He was in long sitting in bed so worked on scooting posteriorly toward [facility] with pt only moving 1-2 inches.  Did educated brother on anterior transfer.  Discussed use of draw sheet (something not stretch), making sure pt's hands aren't on draw sheet when trying to pull, having pt weight shifting and "walking buttock" backward/forward - had pt attempt and he is able to offload well but only scoots back a few inches total.  Pt hurting and with decreased cognition - minimal effort from pt.  Brother tried to encourage  Ambulation/Gait                 Marine scientist [employer] (Stroke Patients Only)       Balance Overall balance assessment: Needs assistance Sitting-balance support: No upper extremity supported Sitting balance-Leahy Scale: Good         Standing balance comment: bil BKA                            Cognition Arousal/Alertness: Lethargic Behavior During Therapy: WFL for tasks assessed/performed Overall Cognitive Status: Impaired/Different from baseline Area of Impairment: Attention;Following commands;Safety/judgement;Awareness;Problem solving;Orientation                 Orientation Level: Time Current  Attention Level: Sustained   Following Commands: Follows one step commands inconsistently Safety/Judgement: Decreased awareness of safety Awareness: Intellectual Problem Solving: Slow processing;Decreased initiation;Difficulty sequencing;Requires verbal cues;Requires tactile cues        Exercises Other Exercises Other Exercises: Seated tricep pushup motion x 5 in long sitting Other Exercises: Shoulder flexion x 5; resisted tricep extension with mod resistance x 5    General Comments General comments (skin integrity, edema, etc.): Spent increased time on education  regarding anterior/posterior transfers.  Pt's brother present and encouraging pt to participate with transfers and exercises as he will be the one assisting at home.  He did report pt did get to w/c on his own at times prior to this BKA.  Also, educated on some UE exercises including resisted tricep extension, shoulder flexion, and seated push ups.  Also, educated on importance of pressure relief.  Pt does excellent job weight shifting in long sitting to compltely offload each buttock - encourage to continue this at least every hour.  Discussed that pt is keeping new BKA knee flexed.  Encouraged resting with straight as able, but emphasized that at least needs to keep pressure off end of residual limb.  Pt keeping knee flexed and hip ER with residual limb pushing into bed.  Placed a rolled towel under lateral thigh to float end of residual limb to promote healing and decrease pressure - educated brother.  Discussed more focus on therapy with UE strengthening and transfers , rather than increasing pt's pain by stretching/working BKA as pt is not candidate for prosthesis - they agree.      Pertinent Vitals/Pain Pain Assessment: 0-10 Pain Score: 10-Worst pain ever Pain Location: R leg Pain Descriptors / Indicators: Restless;Guarding;Discomfort Pain Intervention(s): Limited activity within patient's tolerance;Monitored during session;Repositioned    Home Living                      Prior Function            PT Goals (current goals can now be found in the care plan section) Acute Rehab PT Goals Patient Stated Goal: to go home, absolutely no SNF or rehab PT Goal Formulation: With patient/family Time For Goal Achievement: 10/13/20 Potential to Achieve Goals: Poor Progress towards PT goals: Not progressing toward goals - comment (self limiting, comorbidity limiting, pain limiting)    Frequency    Min 3X/week      PT Plan Current plan remains appropriate    Co-evaluation               AM-PAC PT "6 Clicks" Mobility   Outcome Measure  Help needed turning from your back to your side while in a flat bed without using bedrails?: None Help needed moving from lying on your back to sitting on the side of a flat bed without using bedrails?: A Little Help needed moving to and from a bed to a chair (including a wheelchair)?: Total Help needed standing up from a chair using your arms (e.g., wheelchair or bedside chair)?: Total Help needed to walk in hospital room?: Total Help needed climbing 3-5 steps with a railing? : Total 6 Click Score: 11    End of Session Equipment Utilized During Treatment: Gait belt Activity Tolerance: Other (comment);Patient limited by pain (self limiting; limited by comorbidities) Patient left: in bed;with family/visitor present;with call bell/phone within reach;with bed alarm set Nurse Communication: Mobility status PT Visit Diagnosis: Other abnormalities of gait and mobility (R26.89);Muscle weakness (generalized) (M62.81);Pain Pain - Right/Left:  Right Pain - part of body: Leg     Time: 6812-7517 PT Time Calculation (min) (ACUTE ONLY): 26 min  Charges:  $Therapeutic Exercise: 8-22 mins $Therapeutic Activity: 8-22 mins                     Abran Richard, PT Acute Rehab Services Pager [phone] [facility] [phone]    Karlton Lemon 09/30/2020, 5:07 PM

## 2020-09-30 NOTE — Plan of Care (Signed)

## 2020-10-01 LAB — CBC
HCT: 23.8 % — ABNORMAL LOW (ref 39.0–52.0)
Hemoglobin: 7.3 g/dL — ABNORMAL LOW (ref 13.0–17.0)
MCH: 25 pg — ABNORMAL LOW (ref 26.0–34.0)
MCHC: 30.7 g/dL (ref 30.0–36.0)
MCV: 81.5 fL (ref 80.0–100.0)
Platelets: 570 10*3/uL — ABNORMAL HIGH (ref 150–400)
RBC: 2.92 MIL/uL — ABNORMAL LOW (ref 4.22–5.81)
RDW: 20.4 % — ABNORMAL HIGH (ref 11.5–15.5)
WBC: 10.3 10*3/uL (ref 4.0–10.5)
nRBC: 0 % (ref 0.0–0.2)

## 2020-10-01 LAB — GLUCOSE, CAPILLARY
Glucose-Capillary: 111 mg/dL — ABNORMAL HIGH (ref 70–99)
Glucose-Capillary: 171 mg/dL — ABNORMAL HIGH (ref 70–99)
Glucose-Capillary: 130 mg/dL — ABNORMAL HIGH (ref 70–99)
Glucose-Capillary: 109 mg/dL — ABNORMAL HIGH (ref 70–99)

## 2020-10-01 MED ORDER — METHOCARBAMOL 500 MG PO TABS
500.0000 mg | ORAL_TABLET | Freq: Four times a day (QID) | ORAL | Status: DC | PRN
  Administered 2020-10-01: 500 mg via ORAL
  Filled 2020-10-01: qty 1

## 2020-10-01 MED ORDER — SODIUM CHLORIDE 0.9 % IV SOLN
510.0000 mg | Freq: Once | INTRAVENOUS | Status: AC
  Administered 2020-10-01: 510 mg via INTRAVENOUS
  Filled 2020-10-01 (×2): qty 17

## 2020-10-01 NOTE — Plan of Care (Signed)
  Problem: Education: Goal: Knowledge of General Education information will improve Description: Including pain rating scale, medication(s)/side effects and non-pharmacologic comfort measures Outcome: Progressing   Problem: Health Behavior/Discharge Planning: Goal: Ability to manage health-related needs will improve Outcome: Progressing   Problem: Activity: Goal: Risk for activity intolerance will decrease Outcome: Progressing   Problem: Nutrition: Goal: Adequate nutrition will be maintained Outcome: Progressing   Problem: Coping: Goal: Level of anxiety will decrease Outcome: Progressing   Problem: Elimination: Goal: Will not experience complications related to bowel motility Outcome: Progressing Goal: Will not experience complications related to urinary retention Outcome: Progressing   Problem: Pain Managment: Goal: General experience of comfort will improve Outcome: Progressing

## 2020-10-01 NOTE — Progress Notes (Signed)
Orthopedic Tech Progress Note Patient Details:  Thomas Garcia [birth year] [id number] Ordered brace Patient ID: Thomas Garcia, male   DOB: Jun 19, 1940, 80 y.o.   MRN: [id number]  [name] 10/01/2020, 6:57 AM

## 2020-10-01 NOTE — Progress Notes (Signed)
  Progress Note    10/01/2020 9:57 AM 3 Days Post-Op  Subjective:  having right bka pain   Vitals:   09/30/20 2111 10/01/20 0447  BP: (!) 134/56 (!) 154/57  Pulse: 92 97  Resp: 18 17  Temp: 100.2 F (37.9 C) 99.3 F (37.4 C)  SpO2: 98% 95%    Physical Exam: Awake and alert Right bka site is warm, no erythema and appears to be healing well. His right knee is persistently flexed  CBC    Component Value Date/Time   WBC 10.3 10/01/2020 0052   RBC 2.92 (L) 10/01/2020 0052   HGB 7.3 (L) 10/01/2020 0052   HCT 23.8 (L) 10/01/2020 0052   PLT 570 (H) 10/01/2020 0052   MCV 81.5 10/01/2020 0052   MCH 25.0 (L) 10/01/2020 0052   MCHC 30.7 10/01/2020 0052   RDW 20.4 (H) 10/01/2020 0052   LYMPHSABS 0.9 09/23/2020 2304   MONOABS 1.6 (H) 09/23/2020 2304   EOSABS 0.0 09/23/2020 2304   BASOSABS 0.0 09/23/2020 2304    BMET    Component Value Date/Time   NA 131 (L) 09/30/2020 0645   K 3.5 09/30/2020 0645   CL 100 09/30/2020 0645   CO2 27 09/30/2020 0645   GLUCOSE 122 (H) 09/30/2020 0645   BUN 8 09/30/2020 0645   CREATININE 0.54 (L) 09/30/2020 0645   CREATININE 0.60 (L) 09/21/2020 1426   CALCIUM 7.6 (L) 09/30/2020 0645   GFRNONAA >60 09/30/2020 0645   GFRNONAA 96 09/21/2020 1426   GFRAA 112 09/21/2020 1426    INR    Component Value Date/Time   INR 1.0 07/07/2020 1807     Intake/Output Summary (Last 24 hours) at 10/01/2020 0957 Last data filed at 10/01/2020 0900 Gross per 24 hour  Intake 470 ml  Output 450 ml  Net 20 ml     Assessment:  80 y.o. male is s/p: R bka 3 Days Post-Op  Plan: I have encouraged him to mobilize his right knee. Doubtful he will be able to have limb protector placed with his current level of contracture. Will follow.    Coyle Stordahl C. Donzetta Matters, MD [facility] Office: [phone] Pager: [phone]  10/01/2020 9:57 AM

## 2020-10-01 NOTE — Progress Notes (Signed)
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  Apparently, he had routine blood work that revealed a hemoglobin of 5.5.  He was directed to the emergency department for further evaluation.  Patient transferred to [facility] for worsening right lower extremity pain concerning for infection, history of left BKA, transferred to [facility] for vascular intervention/evaluation.   Previous prolonged hospitalization from 06/26/2020 to 07/14/2020 at which time the patient was diagnosed with adenocarcinoma of the right lung when he presented with a cavitary lung mass. During that hospitalization, the patient underwent stereotactic resection of a brain mass which was felt to be metastasis from his lung cancer.  In addition his hospitalization was prolonged because of the PE.  He was initially started on anticoagulation, but this was stopped secondary to the patient's hemoptysis and bleeding from his brain mass. Retrievable IVC filter was placed on 07/05/2020.  Further anticoagulation was deferred until he had followed up with pulmonology postdischarge.  During that hospitalization, the patient was also treated for postobstructive pneumonia. Unfortunately, he has had poor follow-up since his discharge from the hospital.  He has not yet followed up with medical oncology.  However the patient did undergo radiation to the brain from 08/25/2020 to 08/31/2020.   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  Vascular surgery was consulted with tentative plan for  surgery on 09/28/2020.  TRH consulted for further evaluation management.   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  Started on antibiotics with Ancef.  Evaluated by vascular surgery and underwent BKA on 09/28/2020 by Dr. Carlis Abbott.  Evaluated by PT/OT with recommendations of SNF, but patient prefers to go home with his brother. --Plan home health PT/OT/RN/aide/social work, TOC set up with [employer]. --Oxycodone 5 mg p.o. every 4 hours as needed moderate pain --Morphine 2 mg IV every 2 hours as needed severe pain --Robaxin 5 mg p.o. every 6 hours as needed muscle spasms  Acute on chronic anemia/iron deficiency No evidence of overt blood loss during hospitalization; although FOBT was positive.  Etiology of anemia likely secondary to underlying malignancy/chronic disease.  Patient has been transfused 3 units of PRBCs during the hospitalization so far.  Received IV iron on admission.  Evaluated by gastroenterology and patient declined any endoscopic procedures at this time.  Anemia panel with iron 5, TIBC 244, ferritin 67, B12 240, folate 90. --Hgb 6.8>8.1>8.4>8.2>8.4>7.3>7.3 --Repeat IV iron infusion today --CBC in the a.m.  Type 2 diabetes mellitus  Hemoglobin A1c 5.5 on 09/21/2020, well controlled. --SSI for coverage --CBGs qAC/HS  Metastatic adenocarcinoma of the lung with brain metastases --Palliative care following, appreciate assistance --Keppra 500 mg p.o. twice  daily --Outpatient follow-up with medical oncology  Recent pulmonary embolism Recently started on anticoagulation, but this was stopped secondary to the patient's hemoptysis and bleeding from his brain mass. Retrievable IVC filter was placed on 07/05/2020  HLD: Atorvastatin 10 mg p.o. daily  COVID-19 viral infection, incidental Asymptomatic.  Continue airborne/contact isolation 10 days to end 6/28.  GERD: Protonix 40 mg p.o. daily  Pressure injury, stage I, right buttocks, POA Pressure Injury 09/23/20 Buttocks Right;Left;Mid Stage 1 -  Intact skin with non-blanchable redness of a localized area usually over a bony prominence. 8 X 6 CM redness to sacrum (Active)  09/23/20 0030  Location: Buttocks  Location Orientation: Right;Left;Mid  Staging: Stage 1 -  Intact skin with non-blanchable redness of a localized area usually over a bony prominence.  Wound Description (Comments): 8 X 6 CM redness to sacrum  Present on Admission: Yes    DVT prophylaxis: SCDs Start: 09/22/20 2148   Code Status: DNR Family Communication: No family present at bedside this morning  Disposition Plan:  Level of care: Med-Surg Status is: Inpatient  Remains inpatient appropriate because:Ongoing active pain requiring inpatient pain management, Ongoing diagnostic testing needed not appropriate for outpatient work up, Unsafe d/c plan, IV treatments appropriate due to intensity of illness or inability to take PO, and Inpatient level of care appropriate due to severity of illness  Dispo: The patient is from: Home              Anticipated d/c is to: Home              Patient currently is not medically stable to d/c.   Difficult to place patient No  Consultants:  Vascular surgery Gastroenterology Palliative care  Procedures:  Right BKA, Dr. Carlis Abbott 09/28/2020  Antimicrobials:  Ancef: 6/17>>6/23   Subjective: Patient seen and examined bedside, sleeping but arousable.  Continues to complain of right limb pain  and muscle spasms.  Right leg remains flexed.  No other questions or concerns at this time.  Denies headache, no chest pain, no palpitations, no shortness of breath, no abdominal pain, no weakness, no fatigue, no paresthesias.  No acute events overnight per nursing.  Objective: Vitals:   09/30/20 0700 09/30/20 1310 09/30/20 2111 10/01/20 0447  BP: 135/64 (!) 104/47 (!) 134/56 (!) 154/57  Pulse: 97 (!) 107 92 97  Resp: 17 18 18 17   Temp: 99.1 F (37.3 C) 98.1 F (36.7 C) 100.2 F (37.9 C) 99.3 F (37.4 C)  TempSrc: Oral Oral Oral Oral  SpO2: 95% 96% 98% 95%  Weight:      Height:        Intake/Output Summary (Last 24 hours) at 10/01/2020 1330 Last data filed at 10/01/2020 1200 Gross per 24 hour  Intake 250 ml  Output 450 ml  Net -200 ml   Filed Weights   09/22/20 1159  Weight: 77.1 kg    Examination:  General exam: Appears calm and comfortable; chronically ill appearance, appears older than stated age Respiratory system: Clear to auscultation. Respiratory effort normal.  On room air Cardiovascular system: S1 & S2 heard, RRR. No JVD, murmurs, rubs, gallops or clicks. No pedal edema. Gastrointestinal system: Abdomen is nondistended, soft and nontender. No organomegaly or masses felt. Normal bowel sounds heard. Central nervous system: Alert and oriented. No focal neurological deficits.  Extremities: Noted bilateral BKA's, right BKA/lower extremity flexed Skin: No rashes, lesions or ulcers Psychiatry: Judgement and insight appear poor. Mood & affect appropriate.    Right BKA site  Data Reviewed: I have personally reviewed following labs and imaging studies  CBC: Recent Labs  Lab 09/27/20 0210 09/28/20 0127 09/29/20 0045 09/30/20 0645 10/01/20 0052  WBC 13.3* 13.6* 26.7* 12.0* 10.3  HGB 8.4* 8.2* 8.4* 7.3* 7.3*  HCT 25.9* 26.1* 27.4* 23.7* 23.8*  MCV 78.7* 80.3 82.0 80.9 81.5  PLT 498* 560* 605* 537* 539*   Basic Metabolic Panel: Recent Labs  Lab 09/26/20 0114  09/27/20 0210 09/28/20 0127 09/29/20 0045 09/30/20 0645  NA 134* 136 132* 134* 131*  K 3.9 3.3* 3.1* 3.9 3.5  CL 101 100 98 100 100  CO2 27 25 27 27 27   GLUCOSE 209* 104* 93 189* 122*  BUN 11 7* 8 8 8   CREATININE 0.56* 0.50* 0.53* 0.56* 0.54*  CALCIUM 7.8* 8.1* 8.0* 8.3* 7.6*   GFR: Estimated Creatinine Clearance: 76.1 mL/min (A) (by C-G formula based on SCr of 0.54 mg/dL (L)). Liver Function Tests: No results for input(s): AST, ALT, ALKPHOS, BILITOT, PROT, ALBUMIN in the last 168 hours. No results for input(s): LIPASE, AMYLASE in the last 168 hours. No results for input(s): AMMONIA in the last 168 hours. Coagulation Profile: No results for input(s): INR, PROTIME in the last 168 hours. Cardiac Enzymes: No results for input(s): CKTOTAL, CKMB, CKMBINDEX, TROPONINI in the last 168 hours. BNP (last 3 results) No results for input(s): PROBNP in the last 8760 hours. HbA1C: No results for input(s): HGBA1C in the last 72 hours. CBG: Recent Labs  Lab 09/30/20 1154 09/30/20 1653 09/30/20 2108 10/01/20 0809 10/01/20 1201  GLUCAP 142* 242* 156* 111* 171*   Lipid Profile: No results for input(s): CHOL, HDL, LDLCALC, TRIG, CHOLHDL, LDLDIRECT in the last 72 hours. Thyroid Function Tests: No results for input(s): TSH, T4TOTAL, FREET4, T3FREE, THYROIDAB in the last 72 hours. Anemia Panel: No results for input(s): VITAMINB12, FOLATE, FERRITIN, TIBC, IRON, RETICCTPCT in the last 72 hours. Sepsis Labs: No results for input(s): PROCALCITON, LATICACIDVEN in the last 168 hours.  Recent Results (from the past 240 hour(s))  Culture, blood (routine x 2)     Status: None   Collection Time: 09/22/20 10:01 PM   Specimen: BLOOD  Result Value Ref Range Status   Specimen Description BLOOD RIGHT ANTECUBITAL  Final   Special Requests   Final    BOTTLES DRAWN AEROBIC AND ANAEROBIC Blood Culture adequate volume   Culture   Final    NO GROWTH 5 DAYS Performed at [facility], [address],  [place], [place] [address]    Report Status 09/27/2020 FINAL  Final  Culture, blood (routine x 2)     Status: None   Collection Time: 09/22/20 10:03 PM   Specimen: BLOOD  Result Value Ref Range Status   Specimen Description BLOOD LEFT ANTECUBITAL  Final   Special Requests   Final    BOTTLES DRAWN AEROBIC AND ANAEROBIC Blood Culture adequate volume   Culture   Final    NO GROWTH 5 DAYS Performed at [facility], [address] [place], [place] [address]    Report Status 09/27/2020 FINAL  Final  SARS CORONAVIRUS 2 (TAT 6-24 HRS) Nasopharyngeal Nasopharyngeal Swab     Status: Abnormal   Collection Time: 09/24/20  8:37 AM   Specimen: Nasopharyngeal Swab  Result Value Ref Range Status   SARS Coronavirus 2 POSITIVE (A) NEGATIVE  Final    Comment: (NOTE) SARS-CoV-2 target nucleic acids are DETECTED.  The SARS-CoV-2 RNA is generally detectable in upper and lower respiratory specimens during the acute phase of infection. Positive results are indicative of the presence of SARS-CoV-2 RNA. Clinical correlation with patient history and other diagnostic information is  necessary to determine patient infection status. Positive results do not rule out bacterial infection or co-infection with other viruses.  The expected result is Negative.  Fact Sheet for Patients: SugarRoll.be  Fact Sheet for Healthcare Providers: https://www.woods-mathews.com/  This test is not yet approved or cleared by the [place] FDA and  has been authorized for detection and/or diagnosis of SARS-CoV-2 by FDA under an Emergency Use Authorization (EUA). This EUA will remain  in effect (meaning this test can be used) for the duration of the COVID-19 declaration under Section 564(b)(1) of the Act, 21 U. S.C. section 360bbb-3(b)(1), unless the authorization is terminated or revoked sooner.   Performed at [facility], [place] [address], [place], [place] [address]           Radiology Studies: No results found.      Scheduled Meds:  (feeding supplement) PROSource Plus  30 mL Oral TID BM   atorvastatin  10 mg Oral Daily   feeding supplement  237 mL Oral BID BM   insulin aspart  0-5 Units Subcutaneous QHS   insulin aspart  0-9 Units Subcutaneous TID WC   levETIRAcetam  500 mg Oral BID   multivitamin  1 tablet Oral Daily   pantoprazole  40 mg Oral Daily   traZODone  50 mg Oral QHS   Continuous Infusions:  ferumoxytol       LOS: 9 days    Time spent: 45 minutes spent on chart review, discussion with nursing staff, consultants, updating family and interview/physical exam; more than 50% of that time was spent in counseling and/or coordination of care.    Michaele Amundson J [place] DO [facility] Available via Epic secure chat 7am-7pm After these hours, please refer to coverage provider listed on [email or web address] 10/01/2020, 1:30 PM

## 2020-10-02 LAB — GLUCOSE, CAPILLARY
Glucose-Capillary: 112 mg/dL — ABNORMAL HIGH (ref 70–99)
Glucose-Capillary: 170 mg/dL — ABNORMAL HIGH (ref 70–99)
Glucose-Capillary: 166 mg/dL — ABNORMAL HIGH (ref 70–99)
Glucose-Capillary: 112 mg/dL — ABNORMAL HIGH (ref 70–99)

## 2020-10-02 LAB — CBC
HCT: 23.3 % — ABNORMAL LOW (ref 39.0–52.0)
Hemoglobin: 7.2 g/dL — ABNORMAL LOW (ref 13.0–17.0)
MCH: 25 pg — ABNORMAL LOW (ref 26.0–34.0)
MCHC: 30.9 g/dL (ref 30.0–36.0)
MCV: 80.9 fL (ref 80.0–100.0)
Platelets: 533 10*3/uL — ABNORMAL HIGH (ref 150–400)
RBC: 2.88 MIL/uL — ABNORMAL LOW (ref 4.22–5.81)
RDW: 21.2 % — ABNORMAL HIGH (ref 11.5–15.5)
WBC: 10.6 10*3/uL — ABNORMAL HIGH (ref 4.0–10.5)
nRBC: 0 % (ref 0.0–0.2)

## 2020-10-02 NOTE — Plan of Care (Signed)

## 2020-10-02 NOTE — Progress Notes (Signed)
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  Apparently, he had routine blood work that revealed a hemoglobin of 5.5.  He was directed to the emergency department for further evaluation.  Patient transferred to [facility] for worsening right lower extremity pain concerning for infection, history of left BKA, transferred to [facility] for vascular intervention/evaluation.   Previous prolonged hospitalization from 06/26/2020 to 07/14/2020 at which time the patient was diagnosed with adenocarcinoma of the right lung when he presented with a cavitary lung mass. During that hospitalization, the patient underwent stereotactic resection of a brain mass which was felt to be metastasis from his lung cancer.  In addition his hospitalization was prolonged because of the PE.  He was initially started on anticoagulation, but this was stopped secondary to the patient's hemoptysis and bleeding from his brain mass. Retrievable IVC filter was placed on 07/05/2020.  Further anticoagulation was deferred until he had followed up with pulmonology postdischarge.  During that hospitalization, the patient was also treated for postobstructive pneumonia. Unfortunately, he has had poor follow-up since his discharge from the hospital.  He has not yet followed up with medical oncology.  However the patient did undergo radiation to the brain from 08/25/2020 to 08/31/2020.   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  Vascular surgery was consulted with tentative plan for  surgery on 09/28/2020.  TRH consulted for further evaluation management.   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  Started on antibiotics with Ancef.  Evaluated by vascular surgery and underwent BKA on 09/28/2020 by Dr. Carlis Abbott.  Evaluated by PT/OT with recommendations of SNF, but patient prefers to go home with his brother. --Plan home health PT/OT/RN/aide/social work, TOC set up with [employer]. --Oxycodone 5 mg p.o. every 4 hours as needed moderate pain --Morphine 2 mg IV every 2 hours as needed severe pain --Robaxin 500 mg p.o. every 6 hours as needed muscle spasms --Outpatient follow-up with vascular surgery  Acute on chronic anemia/iron deficiency No evidence of overt blood loss during hospitalization; although FOBT was positive.  Etiology of anemia likely secondary to underlying malignancy/chronic disease.  Anemia panel with iron 5, TIBC 244, ferritin 67, B12 240, folate 90. Patient has been transfused 3 units of PRBCs during the hospitalization so far.  Received IV iron on admission and repeated on 6/25.  Evaluated by gastroenterology and patient declined any endoscopic procedures at this time.   --Hgb 6.8>8.1>8.4>8.2>8.4>7.3>7.3>7.2  Type 2 diabetes  mellitus Hemoglobin A1c 5.5 on 09/21/2020, well controlled. 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 twice daily --Outpatient follow-up with medical oncology  Recent pulmonary embolism Recently started on anticoagulation, but this was stopped secondary to the patient's hemoptysis and bleeding from his brain mass. Retrievable IVC filter was placed on 07/05/2020  HLD: Atorvastatin 10 mg p.o. daily  COVID-19 viral infection, incidental Asymptomatic.  Continue airborne/contact isolation 10 days to end 6/28.  GERD: Protonix 40 mg p.o. daily  Pressure injury, stage I, right buttocks, POA Pressure Injury 09/23/20 Buttocks Right;Left;Mid Stage 1 -  Intact skin with non-blanchable redness of a localized area usually over a bony prominence. 8 X 6 CM redness to sacrum (Active)  09/23/20 0030  Location: Buttocks  Location Orientation: Right;Left;Mid  Staging: Stage 1 -  Intact skin with non-blanchable redness of a localized area usually over a bony prominence.  Wound Description (Comments): 8 X 6 CM redness to sacrum  Present on Admission: Yes    DVT prophylaxis: SCDs Start: 09/22/20 2148   Code Status: DNR Family Communication: No family present at bedside this morning, attempted to update patient's brother Thomas Garcia via telephone, unsuccessful.  Disposition Plan:  Level of care: Med-Surg Status is: Inpatient  Remains inpatient appropriate because:Ongoing active pain requiring inpatient pain management, Ongoing diagnostic testing needed not appropriate for outpatient work up, Unsafe d/c plan, IV treatments appropriate due to intensity of illness or inability to take PO, and Inpatient level of care appropriate due to severity of illness  Dispo: The patient is from: Home              Anticipated d/c is to: Home              Patient currently is not medically stable to d/c.   Difficult to place patient No  Consultants:  Vascular surgery Gastroenterology Palliative care  Procedures:  Right BKA, Dr. Carlis Abbott 09/28/2020  Antimicrobials:  Ancef:  6/17>>6/23   Subjective: Patient seen and examined bedside, sleeping but arousable.  Right leg remains flexed, reports pain/muscle spasms improved.  No other questions or concerns at this time.  Denies headache, no chest pain, no palpitations, no shortness of breath, no abdominal pain, no weakness, no fatigue, no paresthesias.  No acute events overnight per nursing.  Objective: Vitals:   10/01/20 0447 10/01/20 1430 10/01/20 2106 10/02/20 0636  BP: (!) 154/57 (!) 137/58 (!) 138/57 (!) 130/56  Pulse: 97 (!) 102 (!) 56 87  Resp: 17 18 18 17   Temp: 99.3 F (37.4 C) 98 F (36.7 C) 98.1 F (36.7 C) 98.1 F (36.7 C)  TempSrc: Oral Oral Oral Oral  SpO2: 95% 96% 94% 97%  Weight:      Height:        Intake/Output Summary (Last 24 hours) at 10/02/2020 1008 Last data filed at 10/01/2020 1800 Gross per 24 hour  Intake 290 ml  Output 550 ml  Net -260 ml   Filed Weights   09/22/20 1159  Weight: 77.1 kg    Examination:  General exam: Appears calm and comfortable; chronically ill appearance, appears older than stated age Respiratory system: Clear to auscultation. Respiratory effort normal.  On room air Cardiovascular system: S1 & S2 heard, RRR. No JVD, murmurs, rubs, gallops or clicks. No pedal edema. Gastrointestinal system: Abdomen is nondistended, soft and nontender. No organomegaly or masses felt. Normal bowel sounds heard. Central nervous system: Alert and  oriented. No focal neurological deficits. Extremities: Noted bilateral BKA's, right BKA/lower extremity flexed Skin: No rashes, lesions or ulcers Psychiatry: Judgement and insight appear poor. Mood & affect appropriate.    Right BKA site  Data Reviewed: I have personally reviewed following labs and imaging studies  CBC: Recent Labs  Lab 09/28/20 0127 09/29/20 0045 09/30/20 0645 10/01/20 0052 10/02/20 0057  WBC 13.6* 26.7* 12.0* 10.3 10.6*  HGB 8.2* 8.4* 7.3* 7.3* 7.2*  HCT 26.1* 27.4* 23.7* 23.8* 23.3*  MCV 80.3 82.0  80.9 81.5 80.9  PLT 560* 605* 537* 570* 628*   Basic Metabolic Panel: Recent Labs  Lab 09/26/20 0114 09/27/20 0210 09/28/20 0127 09/29/20 0045 09/30/20 0645  NA 134* 136 132* 134* 131*  K 3.9 3.3* 3.1* 3.9 3.5  CL 101 100 98 100 100  CO2 27 25 27 27 27   GLUCOSE 209* 104* 93 189* 122*  BUN 11 7* 8 8 8   CREATININE 0.56* 0.50* 0.53* 0.56* 0.54*  CALCIUM 7.8* 8.1* 8.0* 8.3* 7.6*   GFR: Estimated Creatinine Clearance: 76.1 mL/min (A) (by C-G formula based on SCr of 0.54 mg/dL (L)). Liver Function Tests: No results for input(s): AST, ALT, ALKPHOS, BILITOT, PROT, ALBUMIN in the last 168 hours. No results for input(s): LIPASE, AMYLASE in the last 168 hours. No results for input(s): AMMONIA in the last 168 hours. Coagulation Profile: No results for input(s): INR, PROTIME in the last 168 hours. Cardiac Enzymes: No results for input(s): CKTOTAL, CKMB, CKMBINDEX, TROPONINI in the last 168 hours. BNP (last 3 results) No results for input(s): PROBNP in the last 8760 hours. HbA1C: No results for input(s): HGBA1C in the last 72 hours. CBG: Recent Labs  Lab 10/01/20 0809 10/01/20 1201 10/01/20 1716 10/01/20 2104 10/02/20 0750  GLUCAP 111* 171* 130* 109* 112*   Lipid Profile: No results for input(s): CHOL, HDL, LDLCALC, TRIG, CHOLHDL, LDLDIRECT in the last 72 hours. Thyroid Function Tests: No results for input(s): TSH, T4TOTAL, FREET4, T3FREE, THYROIDAB in the last 72 hours. Anemia Panel: No results for input(s): VITAMINB12, FOLATE, FERRITIN, TIBC, IRON, RETICCTPCT in the last 72 hours. Sepsis Labs: No results for input(s): PROCALCITON, LATICACIDVEN in the last 168 hours.  Recent Results (from the past 240 hour(s))  Culture, blood (routine x 2)     Status: None   Collection Time: 09/22/20 10:01 PM   Specimen: BLOOD  Result Value Ref Range Status   Specimen Description BLOOD RIGHT ANTECUBITAL  Final   Special Requests   Final    BOTTLES DRAWN AEROBIC AND ANAEROBIC Blood  Culture adequate volume   Culture   Final    NO GROWTH 5 DAYS Performed at [facility], [address], [place], [place] [address]    Report Status 09/27/2020 FINAL  Final  Culture, blood (routine x 2)     Status: None   Collection Time: 09/22/20 10:03 PM   Specimen: BLOOD  Result Value Ref Range Status   Specimen Description BLOOD LEFT ANTECUBITAL  Final   Special Requests   Final    BOTTLES DRAWN AEROBIC AND ANAEROBIC Blood Culture adequate volume   Culture   Final    NO GROWTH 5 DAYS Performed at [facility], [address] [place], [place] [address]    Report Status 09/27/2020 FINAL  Final  SARS CORONAVIRUS 2 (TAT 6-24 HRS) Nasopharyngeal Nasopharyngeal Swab     Status: Abnormal   Collection Time: 09/24/20  8:37 AM   Specimen: Nasopharyngeal Swab  Result Value Ref Range Status   SARS  Coronavirus 2 POSITIVE (A) NEGATIVE Final    Comment: (NOTE) SARS-CoV-2 target nucleic acids are DETECTED.  The SARS-CoV-2 RNA is generally detectable in upper and lower respiratory specimens during the acute phase of infection. Positive results are indicative of the presence of SARS-CoV-2 RNA. Clinical correlation with patient history and other diagnostic information is  necessary to determine patient infection status. Positive results do not rule out bacterial infection or co-infection with other viruses.  The expected result is Negative.  Fact Sheet for Patients: SugarRoll.be  Fact Sheet for Healthcare Providers: https://www.woods-mathews.com/  This test is not yet approved or cleared by the [place] FDA and  has been authorized for detection and/or diagnosis of SARS-CoV-2 by FDA under an Emergency Use Authorization (EUA). This EUA will remain  in effect (meaning this test can be used) for the duration of the COVID-19 declaration under Section 564(b)(1) of the Act, 21 U. S.C. section 360bbb-3(b)(1), unless the authorization is terminated  or revoked sooner.   Performed at [facility], [place] [address] [place], [place] [address]          Radiology Studies: No results found.      Scheduled Meds:  (feeding supplement) PROSource Plus  30 mL Oral TID BM   atorvastatin  10 mg Oral Daily   feeding supplement  237 mL Oral BID BM   insulin aspart  0-5 Units Subcutaneous QHS   insulin aspart  0-9 Units Subcutaneous TID WC   levETIRAcetam  500 mg Oral BID   multivitamin  1 tablet Oral Daily   pantoprazole  40 mg Oral Daily   traZODone  50 mg Oral QHS   Continuous Infusions:     LOS: 10 days    Time spent: 36 minutes spent on chart review, discussion with nursing staff, consultants, updating family and interview/physical exam; more than 50% of that time was spent in counseling and/or coordination of care.    Gailyn Crook J [place] DO [facility] Available via Epic secure chat 7am-7pm After these hours, please refer to coverage provider listed on [email or web address] 10/02/2020, 10:08 AM

## 2020-10-02 NOTE — Progress Notes (Addendum)
[facility]  Subjective  - Pain in the right BKA   Objective (!) 130/56 87 98.1 F (36.7 C) (Oral) 17 97%  Intake/Output Summary (Last 24 hours) at 10/02/2020 0811 Last data filed at 10/01/2020 1800 Gross per 24 hour  Intake 290 ml  Output 550 ml  Net -260 ml    Right BKA healing well, knee contracture developing Lungs non labored breathing  Assessment/Planning: POD # 4 right BKA He has developed a knee contracture and does not tolerate knee immobilizer. Incision healing well  Encourage right knee mobility F/U with [facility] arranged  Roxy Horseman 10/02/2020 8:11 AM --  Laboratory Lab Results: Recent Labs    10/01/20 0052 10/02/20 0057  WBC 10.3 10.6*  HGB 7.3* 7.2*  HCT 23.8* 23.3*  PLT 570* 533*   BMET Recent Labs    09/30/20 0645  NA 131*  K 3.5  CL 100  CO2 27  GLUCOSE 122*  BUN 8  CREATININE 0.54*  CALCIUM 7.6*    COAG Lab Results  Component Value Date   INR 1.0 07/07/2020   INR 1.1 06/26/2020   INR 1.1 09/08/2018   No results found for: PTT  I have independently interviewed and examined patient and agree with PA assessment and plan above.  Doubtful he can will be able to tolerate knee immobilizer.  We will schedule outpatient follow-up for staple removal as wound appears to be healing well.  Shady Bradish C. Donzetta Matters, MD [facility] Office: [phone] Pager: [phone]

## 2020-10-03 LAB — GLUCOSE, CAPILLARY
Glucose-Capillary: 113 mg/dL — ABNORMAL HIGH (ref 70–99)
Glucose-Capillary: 110 mg/dL — ABNORMAL HIGH (ref 70–99)

## 2020-10-03 MED ORDER — METHOCARBAMOL 500 MG PO TABS: 500.0000 mg | ORAL_TABLET | Freq: Four times a day (QID) | ORAL | 0 refills | Status: AC | PRN

## 2020-10-03 MED ORDER — METFORMIN HCL 500 MG PO TABS: 500.0000 mg | ORAL_TABLET | Freq: Two times a day (BID) | ORAL | 2 refills | Status: AC

## 2020-10-03 MED ORDER — OXYCODONE-ACETAMINOPHEN 10-325 MG PO TABS: 1.0000 | ORAL_TABLET | ORAL | 0 refills | Status: DC | PRN

## 2020-10-03 MED ORDER — LEVETIRACETAM 500 MG PO TABS: 500.0000 mg | ORAL_TABLET | Freq: Two times a day (BID) | ORAL | 2 refills | Status: AC

## 2020-10-03 NOTE — Discharge Instructions (Signed)
Recommendations for Outpatient Follow-up:  Follow up with PCP in 1-2 weeks Follow-up with vascular surgery, Dr. Carlis Abbott as scheduled Outpatient follow-up with medical oncology Discontinued lisinopril due to well-controlled BP without antihypertensive therapy Continue Percocet and Robaxin as needed for pain/muscle spasm relief from recent right BKA Continue Keppra twice daily for brain mass Recommend obtaining CBC 1 week.

## 2020-10-03 NOTE — Discharge Summary (Signed)
Physician Discharge Summary  Thomas Garcia:[id number] DOB: 08-29-1940 DOA: 09/22/2020  PCP: Doree Albee, MD  Admit date: 09/22/2020 Discharge date: 10/03/2020  Admitted From: Home Disposition: Home ([facility] placement)  Recommendations for Outpatient Follow-up:  Follow up with PCP in 1-2 weeks Follow-up with vascular surgery, Dr. Carlis Abbott as scheduled Outpatient follow-up with medical oncology Discontinued lisinopril due to well-controlled BP without antihypertensive therapy Continue Percocet and Robaxin as needed for pain/muscle spasm relief from recent right BKA Continue Keppra twice daily for brain mass Recommend obtaining CBC 1 week.  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  Apparently, he had routine blood work that revealed a hemoglobin of 5.5.  He was directed to the emergency department for further evaluation.  Patient transferred to [facility] for worsening right lower extremity pain concerning for infection, history of left BKA, transferred to [facility] for vascular intervention/evaluation.   Previous prolonged hospitalization from 06/26/2020 to 07/14/2020 at which time the patient was diagnosed with adenocarcinoma of the right lung when he presented with a cavitary lung mass. During that hospitalization, the patient underwent stereotactic resection of a brain mass which was felt to be metastasis from his lung cancer.  In addition his hospitalization was prolonged  because of the PE.  He was initially started on anticoagulation, but this was stopped secondary to the patient's hemoptysis and bleeding from his brain mass. Retrievable IVC filter was placed on 07/05/2020.  Further anticoagulation was deferred until he had followed up with pulmonology postdischarge.  During that hospitalization, the patient was also treated for postobstructive pneumonia. Unfortunately, he has had poor follow-up since his discharge from the hospital.  He has not yet followed up with medical oncology.  However the patient did undergo radiation to the brain from 08/25/2020 to 08/31/2020.   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  Vascular surgery was consulted with tentative plan for surgery on 09/28/2020.  TRH consulted for further evaluation management.  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  Started on antibiotics with Ancef.  Evaluated by vascular surgery and underwent BKA on 09/28/2020 by Dr. Carlis Abbott.  Evaluated by PT/OT with recommendations of SNF, but patient prefers to go home with his brother.  Continue Percocet and Robaxin as needed for pain/muscle spasms.  Continue Plavix.  Social work/case management has set up home health PT/OT/RN/aide/social work with [employer].  Outpatient follow-up with vascular surgery.   Acute on chronic anemia/iron deficiency No evidence of overt blood loss during hospitalization; although FOBT was positive.  Etiology of anemia likely secondary to underlying malignancy/chronic disease.  Patient has been transfused 3 units of PRBCs during the hospitalization so far.  Received IV iron on admission.  Evaluated by gastroenterology and patient declined any endoscopic procedures at this time.  Anemia panel with iron 5, TIBC 244, ferritin 67, B12 240, folate 90.  Recommend CBC in 1 week.   Type 2  diabetes mellitus Hemoglobin A1c 5.5  on 09/21/2020, well controlled.  Continue metformin.   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 twice daily.  Pelvic care was consulted and followed during hospitalization.  Outpatient follow-up with medical oncology.  Recent pulmonary embolism Recently started on anticoagulation, but this was stopped secondary to the patient's hemoptysis patient and bleeding from his brain mass. Retrievable IVC filter was placed on 07/05/2020   HLD: Atorvastatin 10 mg p.o. daily   COVID-19 viral infection, incidental Asymptomatic.  Continue airborne/contact isolation 10 days to end 10/04/20.   GERD: Protonix 40 mg p.o. daily   Pressure injury, stage I, right buttocks, POA Pressure Injury 09/23/20 Buttocks Right;Left;Mid Stage 1 -  Intact skin with non-blanchable redness of a localized area usually over a bony prominence. 8 X 6 CM redness to sacrum (Active)  09/23/20 0030  Location: Buttocks  Location Orientation: Right;Left;Mid  Staging: Stage 1 -  Intact skin with non-blanchable redness of a localized area usually over a bony prominence.  Wound Description (Comments): 8 X 6 CM redness to sacrum  Present on Admission: Yes    Discharge Diagnoses:  Principal Problem:   Acute on chronic anemia Active Problems:   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 (HCC)   PVD (peripheral vascular disease) (HCC)   Unilateral complete BKA, left, sequela (HCC)   Diabetes mellitus type 2 in nonobese (HCC)   Brain mass   Pulmonary embolism (HCC)   Malignant neoplasm of upper lobe of right lung (HCC)   Acute anemia   Ulcer of leg, chronic, right (HCC)   Right foot ulcer (HCC)   Heme + stool    Discharge Instructions  Discharge Instructions     Call MD for:  difficulty breathing, headache or visual disturbances   Complete by: As directed    Call MD for:  extreme fatigue   Complete by: As directed    Call  MD for:  persistant dizziness or light-headedness   Complete by: As directed    Call MD for:  persistant nausea and vomiting   Complete by: As directed    Call MD for:  severe uncontrolled pain   Complete by: As directed    Call MD for:  temperature >100.4   Complete by: As directed    Diet - low sodium heart healthy   Complete by: As directed    Increase activity slowly   Complete by: As directed    No wound care   Complete by: As directed       Allergies as of 10/03/2020   No Known Allergies      Medication List     STOP taking these medications    lisinopril 5 MG tablet Commonly known as: ZESTRIL   nicotine 14 mg/24hr patch Commonly known as: Nicoderm CQ   oxycodone 5 MG capsule Commonly known as: OXY-IR   saccharomyces boulardii 250 MG capsule Commonly known as: FLORASTOR       TAKE these medications    atorvastatin 10 MG tablet Commonly known as: LIPITOR Take 1 tablet (10 mg total) by mouth daily at 6 PM.   clopidogrel 75 MG tablet Commonly known as: PLAVIX Take 1 tablet (75 mg total) by mouth daily.   ferrous sulfate 324 MG Tbec Take 324 mg by mouth daily with breakfast.   levETIRAcetam 500 MG tablet Commonly known as: KEPPRA Take 1 tablet (500 mg total) by mouth 2 (two) times daily.   loperamide 2 MG capsule Commonly known as:  IMODIUM Take 1 capsule (2 mg total) by mouth as needed for diarrhea or loose stools.   metFORMIN 500 MG tablet Commonly known as: GLUCOPHAGE Take 1 tablet (500 mg total) by mouth 2 (two) times daily with a meal.   methocarbamol 500 MG tablet Commonly known as: ROBAXIN Take 1 tablet (500 mg total) by mouth every 6 (six) hours as needed for muscle spasms.   oxyCODONE-acetaminophen 10-325 MG tablet Commonly known as: Percocet Take 1 tablet by mouth every 4 (four) hours as needed for up to 7 days for pain.   pantoprazole 40 MG tablet Commonly known as: PROTONIX Take 1 tablet (40 mg total) by mouth daily at 6 (six)  AM.   Rolaids Advanced 1000-200-40 MG Chew Generic drug: Cal Carb-Mag Hydrox-Simeth Chew 1 tablet by mouth daily as needed (for indigestion).   traZODone 50 MG tablet Commonly known as: DESYREL Take 1 tablet (50 mg total) by mouth at bedtime.       ASK your doctor about these medications    acetaminophen 325 MG tablet Commonly known as: TYLENOL Take 2 tablets (650 mg total) by mouth every 4 (four) hours as needed for headache or mild pain.        Follow-up Information     Marty Heck, MD Follow up.   Specialty: Vascular Surgery Why: office will call you to arrange your appt (sent) Contact information: [place] [address] [place], [facility] Follow up.   Specialty: Home Health Services Contact information: [place] [address] [phone]         Doree Albee, MD. Schedule an appointment as soon as possible for a visit in 1 week(s).   Specialty: Internal Medicine Contact information: [place] [address] [phone]                No Known Allergies  Consultations: Vascular surgery Palliative care Gastroenterology   Procedures/Studies: CT TIBIA FIBULA RIGHT W CONTRAST  Result Date: 09/22/2020 CLINICAL DATA:  80 year old male with right lower extremity ulcer posteriorly. History of metastatic lung cancer. EXAM: CT OF THE LOWER RIGHT EXTREMITY WITH CONTRAST TECHNIQUE: Multidetector CT imaging of the lower right extremity was performed according to the standard protocol following intravenous contrast administration. CONTRAST:  67mL OMNIPAQUE IOHEXOL 300 MG/ML  SOLN COMPARISON:  None. FINDINGS: Bones/Joint/Cartilage There is no acute fracture or dislocation. 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 There is moderate degenerative changes of the right knee. Ligaments Suboptimally assessed by CT. Muscles and Tendons No intramuscular fluid collection or hematoma. Soft  tissues There is ulceration of the posterior skin over the distal lower extremity. There is thickening of the skin and diffuse subcutaneous edema. Subcutaneous air noted in the region of the ulceration. No drainable fluid collection or abscess identified. IMPRESSION: 1. No acute fracture or dislocation. 2. Ulceration of the posterior skin over the distal lower extremity. No drainable fluid collection or abscess. Electronically Signed   By: Anner Crete M.D.   On: 09/22/2020 21:10   VAS [name] ABI WITH/WO TBI  Result Date: 09/25/2020  LOWER EXTREMITY DOPPLER STUDY Patient Name:  YONAS BUNDA  Date of Exam:   09/25/2020 Medical Rec #: [id number]        Accession #:    [id number] Date of Birth: 04-Jul-1940        Patient Gender: M Patient Age:   80Y Exam Location:  [facility] Procedure:  VAS [name] ABI WITH/WO TBI Referring Phys: [id number] [facility] J SETZER --------------------------------------------------------------------------------  Indications: Ulceration, and peripheral artery disease. High Risk Factors: Diabetes, past history of smoking. Other Factors: PAD, LLE BKA, lung CA with brain mets.  Comparison Study: Previous exam 03/10/19 RLE -ABI 0.52 TBI 0.52 Performing Technologist: Rogelia Rohrer RVT, RDMS  Examination Guidelines: A complete evaluation includes at minimum, Doppler waveform signals and systolic blood pressure reading at the level of bilateral brachial, anterior tibial, and posterior tibial arteries, when vessel segments are accessible. Bilateral testing is considered an integral part of a complete examination. Photoelectric Plethysmograph (PPG) waveforms and toe systolic pressure readings are included as required and additional duplex testing as needed. Limited examinations for reoccurring indications may be performed as noted.  ABI Findings: +--------+------------------+-----+-------------------+--------+ Right   Rt Pressure (mmHg)IndexWaveform           Comment   +--------+------------------+-----+-------------------+--------+ [id number]                    triphasic                   +--------+------------------+-----+-------------------+--------+ PTA     34                0.23 dampened monophasic         +--------+------------------+-----+-------------------+--------+ DP      47                0.32 dampened monophasic         +--------+------------------+-----+-------------------+--------+ +--------+------------------+-----+--------+-------+ Left    Lt Pressure (mmHg)IndexWaveformComment +--------+------------------+-----+--------+-------+ [id number]                    biphasic        +--------+------------------+-----+--------+-------+ +-------+-----------+-----------+------------+------------+ ABI/TBIToday's ABIToday's TBIPrevious ABIPrevious TBI +-------+-----------+-----------+------------+------------+ Right  0.32       0          0.52        0.52         +-------+-----------+-----------+------------+------------+ Right ABIs and TBIs appear decreased.  Summary: Right: Resting right ankle-brachial index indicates severe right lower extremity arterial disease. 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 Left: LT BKA.  *See table(s) above for measurements and observations.  Electronically signed by Harold Barban MD on 09/25/2020 at 9:30:04 PM.    Final      Subjective: Patient seen and examined at bedside, resting comfortably.  Patient states slept fairly well overnight, continues with intermittent pain/muscle spasms.  No other questions or concerns at this time.  Discharging home with home health services, has declined SNF placement.  Discussed with patient needs close follow-up with vascular surgery who will call for an appointment and needs to follow-up with medical oncology.  Denies headache, no chest pain, no palpitations, no shortness of breath, no abdominal pain.  No acute events overnight per nurse staff.  Discharge  Exam: Vitals:   10/02/20 2209 10/03/20 0502  BP: (!) 128/56 (!) 127/52  Pulse: 89 90  Resp: 18 18  Temp: 99.5 F (37.5 C) 98.6 F (37 C)  SpO2: 96% 95%   Vitals:   10/02/20 0636 10/02/20 1439 10/02/20 2209 10/03/20 0502  BP: (!) 130/56 125/60 (!) 128/56 (!) 127/52  Pulse: 87 91 89 90  Resp: 17 17 18 18   Temp: 98.1 F (36.7 C) 98.4 F (36.9 C) 99.5 F (37.5 C) 98.6 F (37 C)  TempSrc: Oral Oral Oral Oral  SpO2: 97% 100% 96% 95%  Weight:      Height:  General: Pt is alert, awake, not in acute distress, chronically ill in appearance, appears older than stated age Cardiovascular: RRR, S1/S2 +, no rubs, no gallops Respiratory: CTA bilaterally, no wheezing, no rhonchi, on room air Abdominal: Soft, NT, ND, bowel sounds + Extremities: Noted bilateral BKA's, right BKA/lower extremity flexed    The results of significant diagnostics from this hospitalization (including imaging, microbiology, ancillary and laboratory) are listed below for reference.     Microbiology: Recent Results (from the past 240 hour(s))  SARS CORONAVIRUS 2 (TAT 6-24 HRS) Nasopharyngeal Nasopharyngeal Swab     Status: Abnormal   Collection Time: 09/24/20  8:37 AM   Specimen: Nasopharyngeal Swab  Result Value Ref Range Status   SARS Coronavirus 2 POSITIVE (A) NEGATIVE Final    Comment: (NOTE) SARS-CoV-2 target nucleic acids are DETECTED.  The SARS-CoV-2 RNA is generally detectable in upper and lower respiratory specimens during the acute phase of infection. Positive results are indicative of the presence of SARS-CoV-2 RNA. Clinical correlation with patient history and other diagnostic information is  necessary to determine patient infection status. Positive results do not rule out bacterial infection or co-infection with other viruses.  The expected result is Negative.  Fact Sheet for Patients: SugarRoll.be  Fact Sheet for Healthcare  Providers: https://www.woods-mathews.com/  This test is not yet approved or cleared by the [place] FDA and  has been authorized for detection and/or diagnosis of SARS-CoV-2 by FDA under an Emergency Use Authorization (EUA). This EUA will remain  in effect (meaning this test can be used) for the duration of the COVID-19 declaration under Section 564(b)(1) of the Act, 21 U. S.C. section 360bbb-3(b)(1), unless the authorization is terminated or revoked sooner.   Performed at [facility], [place] [address] [place], [place] [address]      Labs: BNP (last 3 results) No results for input(s): BNP in the last 8760 hours. Basic Metabolic Panel: Recent Labs  Lab 09/27/20 0210 09/28/20 0127 09/29/20 0045 09/30/20 0645  NA 136 132* 134* 131*  K 3.3* 3.1* 3.9 3.5  CL 100 98 100 100  CO2 25 27 27 27   GLUCOSE 104* 93 189* 122*  BUN 7* 8 8 8   CREATININE 0.50* 0.53* 0.56* 0.54*  CALCIUM 8.1* 8.0* 8.3* 7.6*   Liver Function Tests: No results for input(s): AST, ALT, ALKPHOS, BILITOT, PROT, ALBUMIN in the last 168 hours. No results for input(s): LIPASE, AMYLASE in the last 168 hours. No results for input(s): AMMONIA in the last 168 hours. CBC: Recent Labs  Lab 09/28/20 0127 09/29/20 0045 09/30/20 0645 10/01/20 0052 10/02/20 0057  WBC 13.6* 26.7* 12.0* 10.3 10.6*  HGB 8.2* 8.4* 7.3* 7.3* 7.2*  HCT 26.1* 27.4* 23.7* 23.8* 23.3*  MCV 80.3 82.0 80.9 81.5 80.9  PLT 560* 605* 537* 570* 533*   Cardiac Enzymes: No results for input(s): CKTOTAL, CKMB, CKMBINDEX, TROPONINI in the last 168 hours. BNP: Invalid input(s): POCBNP CBG: Recent Labs  Lab 10/02/20 0750 10/02/20 1233 10/02/20 1729 10/02/20 2207 10/03/20 0811  GLUCAP 112* 170* 166* 112* 113*   D-Dimer No results for input(s): DDIMER in the last 72 hours. Hgb A1c No results for input(s): HGBA1C in the last 72 hours. Lipid Profile No results for input(s): CHOL, HDL, LDLCALC, TRIG, CHOLHDL, LDLDIRECT  in the last 72 hours. Thyroid function studies No results for input(s): TSH, T4TOTAL, T3FREE, THYROIDAB in the last 72 hours.  Invalid input(s): FREET3 Anemia work up No results for input(s): VITAMINB12, FOLATE, FERRITIN, TIBC, IRON, RETICCTPCT in  the last 72 hours. Urinalysis    Component Value Date/Time   COLORURINE YELLOW 06/27/2020 0615   APPEARANCEUR CLEAR 06/27/2020 0615   LABSPEC 1.016 06/27/2020 0615   PHURINE 5.0 06/27/2020 0615   GLUCOSEU 50 (A) 06/27/2020 0615   HGBUR NEGATIVE 06/27/2020 0615   HGBUR trace-intact 10/26/2008 0000   BILIRUBINUR NEGATIVE 06/27/2020 0615   KETONESUR 5 (A) 06/27/2020 0615   PROTEINUR 100 (A) 06/27/2020 0615   UROBILINOGEN 0.2 04/27/2012 1142   NITRITE NEGATIVE 06/27/2020 0615   LEUKOCYTESUR NEGATIVE 06/27/2020 0615   Sepsis Labs Invalid input(s): PROCALCITONIN,  WBC,  LACTICIDVEN Microbiology Recent Results (from the past 240 hour(s))  SARS CORONAVIRUS 2 (TAT 6-24 HRS) Nasopharyngeal Nasopharyngeal Swab     Status: Abnormal   Collection Time: 09/24/20  8:37 AM   Specimen: Nasopharyngeal Swab  Result Value Ref Range Status   SARS Coronavirus 2 POSITIVE (A) NEGATIVE Final    Comment: (NOTE) SARS-CoV-2 target nucleic acids are DETECTED.  The SARS-CoV-2 RNA is generally detectable in upper and lower respiratory specimens during the acute phase of infection. Positive results are indicative of the presence of SARS-CoV-2 RNA. Clinical correlation with patient history and other diagnostic information is  necessary to determine patient infection status. Positive results do not rule out bacterial infection or co-infection with other viruses.  The expected result is Negative.  Fact Sheet for Patients: SugarRoll.be  Fact Sheet for Healthcare Providers: https://www.woods-mathews.com/  This test is not yet approved or cleared by the [place] FDA and  has been authorized for detection and/or  diagnosis of SARS-CoV-2 by FDA under an Emergency Use Authorization (EUA). This EUA will remain  in effect (meaning this test can be used) for the duration of the COVID-19 declaration under Section 564(b)(1) of the Act, 21 U. S.C. section 360bbb-3(b)(1), unless the authorization is terminated or revoked sooner.   Performed at [facility], [place] [address], [place], [place] [address]      Time coordinating discharge: Over 30 minutes  SIGNED:   Donnamarie Poag [place] DO  [facility] 10/03/2020, 9:12 AM

## 2020-10-03 NOTE — Progress Notes (Addendum)
Called pt Brother informed pt will be discharged. HE stated the pt's Address is [address] [place], [place], Contrary to the filed address that was [place]. This RN repeated twice to confirm the address and the Brother stated he is not sure why the other address was on file.  Pt is Forgetful at times to solely rely on his information   He also requested we set up transportation for pt and CM is aware .    [place] pt's Brother and advised of prescription pick up, in addition the appointments scheduled for the pt. Advised all information will be also  be on the written paperwork and incase he has any more questions he can call [phone].

## 2020-10-04 ENCOUNTER — Other Ambulatory Visit: Payer: Self-pay

## 2020-10-04 DIAGNOSIS — S88112A Complete traumatic amputation at level between knee and ankle, left lower leg, initial encounter: Secondary | ICD-10-CM

## 2020-10-04 DIAGNOSIS — E1151 Type 2 diabetes mellitus with diabetic peripheral angiopathy without gangrene: Secondary | ICD-10-CM

## 2020-10-04 NOTE — Patient Outreach (Signed)
[facility]) Care Management  10/04/2020  GER RINGENBERG Aug 10, 1940 [id number]  Late entry: 10/03/20 [place] Hurshel Party, MD  Spoke with patient via hospital phone regarding post hospital follow up with Embedded RN CM follow up.  Home health social worker to follow since declining SNF rehab, home with home health, [facility].  Address noted to be [address], [place], [place] with brother, [name].  Natividad Brood, RN BSN [facility] Liaison  [phone] business mobile phone Toll free office [phone]  Fax number: [phone] [email or web address]

## 2020-10-05 ENCOUNTER — Telehealth: Payer: Self-pay

## 2020-10-05 ENCOUNTER — Telehealth (INDEPENDENT_AMBULATORY_CARE_PROVIDER_SITE_OTHER): Payer: Self-pay | Admitting: *Deleted

## 2020-10-05 ENCOUNTER — Other Ambulatory Visit: Payer: Self-pay | Admitting: Physician Assistant

## 2020-10-05 DIAGNOSIS — Z4781 Encounter for orthopedic aftercare following surgical amputation: Secondary | ICD-10-CM | POA: Diagnosis not present

## 2020-10-05 DIAGNOSIS — E1151 Type 2 diabetes mellitus with diabetic peripheral angiopathy without gangrene: Secondary | ICD-10-CM | POA: Diagnosis not present

## 2020-10-05 DIAGNOSIS — D630 Anemia in neoplastic disease: Secondary | ICD-10-CM | POA: Diagnosis not present

## 2020-10-05 DIAGNOSIS — I70221 Atherosclerosis of native arteries of extremities with rest pain, right leg: Secondary | ICD-10-CM | POA: Diagnosis not present

## 2020-10-05 DIAGNOSIS — S81802D Unspecified open wound, left lower leg, subsequent encounter: Secondary | ICD-10-CM | POA: Diagnosis not present

## 2020-10-05 DIAGNOSIS — C7931 Secondary malignant neoplasm of brain: Secondary | ICD-10-CM | POA: Diagnosis not present

## 2020-10-05 DIAGNOSIS — D509 Iron deficiency anemia, unspecified: Secondary | ICD-10-CM | POA: Diagnosis not present

## 2020-10-05 DIAGNOSIS — Z89511 Acquired absence of right leg below knee: Secondary | ICD-10-CM | POA: Diagnosis not present

## 2020-10-05 DIAGNOSIS — C3411 Malignant neoplasm of upper lobe, right bronchus or lung: Secondary | ICD-10-CM | POA: Diagnosis not present

## 2020-10-05 MED ORDER — OXYCODONE-ACETAMINOPHEN 5-325 MG PO TABS: 1.0000 | ORAL_TABLET | Freq: Four times a day (QID) | ORAL | 0 refills | Status: DC | PRN

## 2020-10-05 NOTE — Progress Notes (Signed)
Pt underwent right below knee amputation on 09/28/2020 by Dr. Carlis Abbott.  Pt was discharged two days ago from the hospital and did not receive rx for pain medication.   PDMP reviewed.    Rx for Percocet 5/325 one q6h prn pain #30 no refill sent to [facility] in [place].   Leontine Locket, [facility] 10/05/2020 4:08 PM

## 2020-10-05 NOTE — Chronic Care Management (AMB) (Signed)
  Chronic Care Management   Note  10/05/2020 Name: Thomas Garcia MRN: [id number] DOB: Mar 09, 1941  Thomas Garcia is a 80 y.o. year old male who is a primary care patient of Gosrani, Doristine Johns, MD. I reached out to [name] by phone today in response to a referral sent by Thomas Garcia PCP Doree Albee, MD     Thomas Garcia was given information about Chronic Care Management services today including:  CCM service includes personalized support from designated clinical staff supervised by his physician, including individualized plan of care and coordination with other care providers 24/7 contact phone numbers for assistance for urgent and routine care needs. Service will only be billed when office clinical staff spend 20 minutes or more in a month to coordinate care. Only one practitioner may furnish and bill the service in a calendar month. The patient may stop CCM services at any time (effective at the end of the month) by phone call to the office staff. The patient will be responsible for cost sharing (co-pay) of up to 20% of the service fee (after annual deductible is met).  Patient agreed to services and verbal consent obtained.   Follow up plan: Telephone appointment with care management team member scheduled for:10/12/2020  Thomas Garcia, [place] Management  Direct Dial: [phone]

## 2020-10-05 NOTE — Telephone Encounter (Signed)
Thomas Garcia from [place] calls today to report that patient needs pain medicine s/p discharge from the hospital after amputation. Per PDMP patient did not receive meds at discharge. Sent in RX to pharmacy. Thomas Garcia also says there is a small sore on the inner site on the left leg amputation site - denies any s/s of infection. They have been covering it with a foam dressing. Advised this was okay to continue and keep wound clean and dry.

## 2020-10-06 DIAGNOSIS — Z89511 Acquired absence of right leg below knee: Secondary | ICD-10-CM | POA: Diagnosis not present

## 2020-10-06 DIAGNOSIS — C7931 Secondary malignant neoplasm of brain: Secondary | ICD-10-CM | POA: Diagnosis not present

## 2020-10-06 DIAGNOSIS — E1151 Type 2 diabetes mellitus with diabetic peripheral angiopathy without gangrene: Secondary | ICD-10-CM | POA: Diagnosis not present

## 2020-10-06 DIAGNOSIS — D630 Anemia in neoplastic disease: Secondary | ICD-10-CM | POA: Diagnosis not present

## 2020-10-06 DIAGNOSIS — Z4781 Encounter for orthopedic aftercare following surgical amputation: Secondary | ICD-10-CM | POA: Diagnosis not present

## 2020-10-06 DIAGNOSIS — I70221 Atherosclerosis of native arteries of extremities with rest pain, right leg: Secondary | ICD-10-CM | POA: Diagnosis not present

## 2020-10-06 DIAGNOSIS — D509 Iron deficiency anemia, unspecified: Secondary | ICD-10-CM | POA: Diagnosis not present

## 2020-10-06 DIAGNOSIS — S81802D Unspecified open wound, left lower leg, subsequent encounter: Secondary | ICD-10-CM | POA: Diagnosis not present

## 2020-10-06 DIAGNOSIS — C3411 Malignant neoplasm of upper lobe, right bronchus or lung: Secondary | ICD-10-CM | POA: Diagnosis not present

## 2020-10-07 DIAGNOSIS — Z4781 Encounter for orthopedic aftercare following surgical amputation: Secondary | ICD-10-CM | POA: Diagnosis not present

## 2020-10-07 DIAGNOSIS — C7931 Secondary malignant neoplasm of brain: Secondary | ICD-10-CM | POA: Diagnosis not present

## 2020-10-07 DIAGNOSIS — S81802D Unspecified open wound, left lower leg, subsequent encounter: Secondary | ICD-10-CM | POA: Diagnosis not present

## 2020-10-07 DIAGNOSIS — D630 Anemia in neoplastic disease: Secondary | ICD-10-CM | POA: Diagnosis not present

## 2020-10-07 DIAGNOSIS — C3411 Malignant neoplasm of upper lobe, right bronchus or lung: Secondary | ICD-10-CM | POA: Diagnosis not present

## 2020-10-07 DIAGNOSIS — Z89511 Acquired absence of right leg below knee: Secondary | ICD-10-CM | POA: Diagnosis not present

## 2020-10-07 DIAGNOSIS — D509 Iron deficiency anemia, unspecified: Secondary | ICD-10-CM | POA: Diagnosis not present

## 2020-10-07 DIAGNOSIS — E1151 Type 2 diabetes mellitus with diabetic peripheral angiopathy without gangrene: Secondary | ICD-10-CM | POA: Diagnosis not present

## 2020-10-07 DIAGNOSIS — I70221 Atherosclerosis of native arteries of extremities with rest pain, right leg: Secondary | ICD-10-CM | POA: Diagnosis not present

## 2020-10-11 ENCOUNTER — Telehealth: Payer: Self-pay

## 2020-10-11 ENCOUNTER — Telehealth (INDEPENDENT_AMBULATORY_CARE_PROVIDER_SITE_OTHER): Payer: Self-pay

## 2020-10-11 DIAGNOSIS — E1151 Type 2 diabetes mellitus with diabetic peripheral angiopathy without gangrene: Secondary | ICD-10-CM | POA: Diagnosis not present

## 2020-10-11 DIAGNOSIS — Z4781 Encounter for orthopedic aftercare following surgical amputation: Secondary | ICD-10-CM | POA: Diagnosis not present

## 2020-10-11 DIAGNOSIS — S81802D Unspecified open wound, left lower leg, subsequent encounter: Secondary | ICD-10-CM | POA: Diagnosis not present

## 2020-10-11 DIAGNOSIS — C3411 Malignant neoplasm of upper lobe, right bronchus or lung: Secondary | ICD-10-CM | POA: Diagnosis not present

## 2020-10-11 DIAGNOSIS — C7931 Secondary malignant neoplasm of brain: Secondary | ICD-10-CM | POA: Diagnosis not present

## 2020-10-11 DIAGNOSIS — D509 Iron deficiency anemia, unspecified: Secondary | ICD-10-CM | POA: Diagnosis not present

## 2020-10-11 DIAGNOSIS — I70221 Atherosclerosis of native arteries of extremities with rest pain, right leg: Secondary | ICD-10-CM | POA: Diagnosis not present

## 2020-10-11 DIAGNOSIS — Z89511 Acquired absence of right leg below knee: Secondary | ICD-10-CM | POA: Diagnosis not present

## 2020-10-11 DIAGNOSIS — D630 Anemia in neoplastic disease: Secondary | ICD-10-CM | POA: Diagnosis not present

## 2020-10-11 NOTE — Telephone Encounter (Addendum)
Jolayne Haines from [facility] calls today to report that patient fell over the weekend s/p BKA on 6/22. Says the site has a little redness but does not look infected. It is sore and tender to touch and has moderate serosanguinous drainage. Says staples are intact. No s/s of infection, denies fever/chills says vitals are good. Advised to cover site with xeroform, gauze and a wrap. They are going out to see patient again on Friday and will call back to apprise of his condition.   Received call today on 7/8 from [place] with [facility] - staples still intact and wound looks okay - covered with a dry dressing as patient in currently refusing to wear his shrinker.

## 2020-10-11 NOTE — Telephone Encounter (Signed)
Thomas Garcia a Education officer, museum with [facility] called and wanted verbal approval for MSW for the following times:  1 week X 2 visits O week (going to skip a week) 1 week X 1 visit  Please advise for orders.  Also Thomas Garcia may reach back out to [name] if the patient is not able to get a hospital bed from the upcoming appointments he has with care management.

## 2020-10-11 NOTE — Telephone Encounter (Signed)
Called Debbie Livingston? Not able to understand last name on VM) and gave her the verbal OK for orders.  I also reached out to brother [name] and was not able to leave a VM to let him know that we had an opening on 10/13/2020 at 1pm and I have scheduled the patient for this time. I did leave a VM with Debbie in case they were able to reach the patient so they can plan for transportation. I will try back another time also.

## 2020-10-12 ENCOUNTER — Telehealth (INDEPENDENT_AMBULATORY_CARE_PROVIDER_SITE_OTHER): Payer: Self-pay | Admitting: *Deleted

## 2020-10-12 ENCOUNTER — Ambulatory Visit (INDEPENDENT_AMBULATORY_CARE_PROVIDER_SITE_OTHER): Payer: Medicare HMO | Admitting: *Deleted

## 2020-10-12 ENCOUNTER — Other Ambulatory Visit: Payer: Self-pay

## 2020-10-12 NOTE — Progress Notes (Signed)
Called to start the telephone visit to get ID, DOB. Is on speaker phone with brother/ caretaker  Thomas Garcia. Pt does not have medical devices in the home to take a Blood pressure, Temperature, portable pulse ox. Pt does have CBG- not using currently. Not sure it is still good.

## 2020-10-12 NOTE — Telephone Encounter (Signed)
  Care Management   Follow Up Note   10/12/2020 Name: Thomas Garcia MRN: [id number] DOB: September 17, 1940   Referred by: Doree Albee, MD Reason for referral : Chronic Care Management (PVD, DM2, )   An unsuccessful telephone outreach was attempted today. The patient was referred to the case management team for assistance with care management and care coordination.   Follow Up Plan: Telephone follow up appointment with care management team member scheduled for:  upon care guide rescheduling, in basket message sent.  Jacqlyn Larsen RNC, BSN RN Case Manager [facility] [phone]

## 2020-10-13 ENCOUNTER — Encounter (INDEPENDENT_AMBULATORY_CARE_PROVIDER_SITE_OTHER): Payer: Self-pay | Admitting: Nurse Practitioner

## 2020-10-13 ENCOUNTER — Other Ambulatory Visit: Payer: Self-pay

## 2020-10-13 ENCOUNTER — Telehealth (INDEPENDENT_AMBULATORY_CARE_PROVIDER_SITE_OTHER): Payer: Self-pay

## 2020-10-13 ENCOUNTER — Telehealth (INDEPENDENT_AMBULATORY_CARE_PROVIDER_SITE_OTHER): Payer: Medicare HMO | Admitting: Nurse Practitioner

## 2020-10-13 VITALS — HR 96 | Temp 97.5°F | Ht <= 58 in | Wt 165.0 lb

## 2020-10-13 DIAGNOSIS — C7931 Secondary malignant neoplasm of brain: Secondary | ICD-10-CM

## 2020-10-13 DIAGNOSIS — Z89511 Acquired absence of right leg below knee: Secondary | ICD-10-CM | POA: Diagnosis not present

## 2020-10-13 DIAGNOSIS — Z4781 Encounter for orthopedic aftercare following surgical amputation: Secondary | ICD-10-CM | POA: Diagnosis not present

## 2020-10-13 DIAGNOSIS — Z89512 Acquired absence of left leg below knee: Secondary | ICD-10-CM

## 2020-10-13 DIAGNOSIS — F331 Major depressive disorder, recurrent, moderate: Secondary | ICD-10-CM

## 2020-10-13 DIAGNOSIS — D509 Iron deficiency anemia, unspecified: Secondary | ICD-10-CM | POA: Diagnosis not present

## 2020-10-13 DIAGNOSIS — I70221 Atherosclerosis of native arteries of extremities with rest pain, right leg: Secondary | ICD-10-CM | POA: Diagnosis not present

## 2020-10-13 DIAGNOSIS — S81802D Unspecified open wound, left lower leg, subsequent encounter: Secondary | ICD-10-CM | POA: Diagnosis not present

## 2020-10-13 DIAGNOSIS — D649 Anemia, unspecified: Secondary | ICD-10-CM

## 2020-10-13 DIAGNOSIS — E1151 Type 2 diabetes mellitus with diabetic peripheral angiopathy without gangrene: Secondary | ICD-10-CM | POA: Diagnosis not present

## 2020-10-13 DIAGNOSIS — C3411 Malignant neoplasm of upper lobe, right bronchus or lung: Secondary | ICD-10-CM | POA: Diagnosis not present

## 2020-10-13 DIAGNOSIS — D630 Anemia in neoplastic disease: Secondary | ICD-10-CM | POA: Diagnosis not present

## 2020-10-13 NOTE — Telephone Encounter (Signed)
Called Ben physical therapist with [name] at [phone] and he stated that the orders need to be sent to [facility].  I faxed the DME orders to [facility] at [phone] and received a confirmation fax that the orders went through. I also put a note on the cover sheet to please contact us if they need more information.

## 2020-10-13 NOTE — Progress Notes (Addendum)
Due to national recommendations of social distancing related to the [place] pandemic, an audio-only tele-health visit was felt to be the most appropriate encounter type for this patient today. I connected with  Thomas Garcia on 10/13/20 utilizing audio-only technology and verified that I am speaking with the correct person using two identifiers. The patient was located at their home, and I was located at the office of [facility] during the encounter. I discussed the limitations of evaluation and management by telemedicine. The patient expressed understanding and agreed to proceed.    Subjective:  Patient ID: Thomas Garcia, male    DOB: Mar 26, 1941  Age: 80 y.o. MRN: [id number]  CC:  Chief Complaint  Patient presents with   Other    Post hospital discharge visit       HPI  This patient arrives today for a virtual visit for the above.  His brother Thomas Garcia is also on the phone to assist with the visit.  This patient was hospitalized from June 16 to June 27.  He was originally sent to the hospital for low hemoglobin of 5.5 further evaluation found that his left leg was ischemic and ended up undergoing left BKA.  He has a history of right BKA.  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  He is now home and is being treated with home health.  He uses [place].  It was recommended upon discharge from the hospital that he follow-up with his primary care provider in 1 to 2 weeks, but due to the patient's bedridden status they are having a hard time getting him transported into the doctor's office.  Thus we are doing a virtual visit today.  Physical therapy is working with the patient at home and they have contacted [name] requesting orders for hospital bed with trapezius bar as well as a slide board.  Patient is supposed to follow-up with vascular surgeon and oncology, but has not needed to follow-up with either at this point.  He does plan on doing so once able to.  The  patient tells me that his pain is fairly well controlled currently and he has no concerns today.  Physical therapy had called earlier today to mention that the patient had expressed thoughts of suicide.  Upon further discussion with the patient and the patient's brother, the brother is the one that expressed concerns to the physical therapist.  The brother tells me the patient has a history of suicide attempt, and he feels that the brother is more depressed lately and mentions the brother has made comments about suicide.  The brother tells me the patient does not have a plan nor does he have access to pills, knives, guns, or ability to hang himself.  When the patient is asked the patient denies any thoughts of suicide.  The patient does express some mild depression and would be willing to speak to therapy or psychiatry.  Past Medical History:  Diagnosis Date   Diabetes mellitus without complication ([place])    Kidney stones    Lung cancer ([place]) 2022   PONV (postoperative nausea and vomiting)       Family History  Problem Relation Age of Onset   Diabetes Mother     Social History   Social History Narrative   Divorced since 2011.Lives with brother.Retired,previously maintenance work for [employer]   Social History   Tobacco Use   Smoking status: Former    Packs/day: 0.50  Years: 45.00    Pack years: 22.50    Types: Cigarettes   Smokeless tobacco: Never   Tobacco comments:    4 cigarettes per day  Substance Use Topics   Alcohol use: No     No outpatient medications have been marked as taking for the 10/13/20 encounter (Video Visit) with Ailene Ards, NP.    ROS:  Review of Systems  Respiratory:  Negative for shortness of breath.   Cardiovascular:  Negative for chest pain.  Psychiatric/Behavioral:  Positive for depression. Negative for suicidal ideas.     Objective:   Today's Vitals: Pulse 96   Temp (!) 97.5 F (36.4 C)   SpO2 95%  Vitals with BMI 10/13/2020  10/03/2020 10/03/2020  Height - - -  Weight - - -  BMI - - -  Systolic - 440 347  Diastolic - 69 52  Pulse 96 88 90     Physical Exam Comprehensive physical exam not completed today as office visit was conducted remotely.  Patient sounded well over the phone he was alert and oriented, and appeared to have appropriate judgment.    PHQ9 SCORE ONLY 10/13/2020 09/21/2020 06/22/2019  PHQ-9 Total Score 9 0 0      Assessment and Plan   1. Anemia, unspecified type   2. Moderate episode of recurrent major depressive disorder ([place])   3. Brain metastasis ([place])   4. S/P bilateral BKA (below knee amputation) ([place])      Plan: 1.  We will plan on patient coming to the office next week for blood draw to check his CBC to make sure hemoglobin has remained stable.  We will also check CMP. 2.  We will refer patient to virtual behavioral health and psychiatry. 3.-4.  I believe that a hospital bed is necessary for the patient because he can not use a regular bed due to double amputation and inability to reposition himself in the regular bed. Patient needs a semi-electric hospital bed with a trapeze bar in order to help him with repositioning and transferring out of the bed due to bilateral BKA status.    Tests ordered Orders Placed This Encounter  Procedures   For home use only DME Hospital bed   For home use only DME Other see comment   CBC with Differential/Platelets   Ambulatory referral to Psychiatry   Amb Referral to Palliative Care       No orders of the defined types were placed in this encounter.   Patient to follow-up in 1 week for blood work and then again for office visit in August.  Total time spent on the telephone today was approximately 16 minutes.  Ailene Ards, NP

## 2020-10-13 NOTE — Telephone Encounter (Signed)
Thomas Garcia a physical therapist with [facility] called and stated that the patient needs the following sent in:  Hospital Bed with Thomas Garcia stated that the patient needs to work on transfers from bed to wheelchair and wheelchair to bed and to help with safety for the patient.  Thomas Garcia stated that the patient did have a fall a few days ago and hit the right stump of his right leg that was recently amputated and patient denies any injury but did stated his right stump was painful. Thomas Garcia stated that the bandaging was clean and dry and not visible blood was seen and the nurse is coming out to check the patients bandage.  Thomas Garcia also stated that the patient verbalized thoughts of suicide and the therapist wanted to make [name] aware and he stated that the patients brother Thomas Garcia was advised to keep prescriptions away from the reach of the patient and Thomas Garcia also stated that there are no guns in the house. Thomas Garcia also stated that the patient has verbalized these feelings off and on in the past.   Sending to update you for his telephone visit at 1pm today.

## 2020-10-13 NOTE — Telephone Encounter (Signed)
[employer] does not accept [employer] and called [name] to let us know.  After calling the provider services on the patients [employer] card I spoke to [place] and he gave me several places to contact for DME supplies. Non of the numbers he gave me were able to help and [facility] in [place] is out of stock of hospital beds with trapeze bar and slide boards.  After numerous calls to DME places I was able to reach [facility] in [place] telephone number [phone] and fax number [phone]. I spoke to [facility] and have faxed the paperwork needed and awaiting the confirmation fax.  [employer] of [place] [phone] was kind enough to give me the information for [facility].

## 2020-10-14 DIAGNOSIS — E1151 Type 2 diabetes mellitus with diabetic peripheral angiopathy without gangrene: Secondary | ICD-10-CM | POA: Diagnosis not present

## 2020-10-14 DIAGNOSIS — D509 Iron deficiency anemia, unspecified: Secondary | ICD-10-CM | POA: Diagnosis not present

## 2020-10-14 DIAGNOSIS — Z89511 Acquired absence of right leg below knee: Secondary | ICD-10-CM | POA: Diagnosis not present

## 2020-10-14 DIAGNOSIS — D630 Anemia in neoplastic disease: Secondary | ICD-10-CM | POA: Diagnosis not present

## 2020-10-14 DIAGNOSIS — Z4781 Encounter for orthopedic aftercare following surgical amputation: Secondary | ICD-10-CM | POA: Diagnosis not present

## 2020-10-14 DIAGNOSIS — C7931 Secondary malignant neoplasm of brain: Secondary | ICD-10-CM | POA: Diagnosis not present

## 2020-10-14 DIAGNOSIS — S81802D Unspecified open wound, left lower leg, subsequent encounter: Secondary | ICD-10-CM | POA: Diagnosis not present

## 2020-10-14 DIAGNOSIS — I70221 Atherosclerosis of native arteries of extremities with rest pain, right leg: Secondary | ICD-10-CM | POA: Diagnosis not present

## 2020-10-14 DIAGNOSIS — C3411 Malignant neoplasm of upper lobe, right bronchus or lung: Secondary | ICD-10-CM | POA: Diagnosis not present

## 2020-10-17 ENCOUNTER — Telehealth (INDEPENDENT_AMBULATORY_CARE_PROVIDER_SITE_OTHER): Payer: Self-pay

## 2020-10-17 ENCOUNTER — Ambulatory Visit
Admission: RE | Admit: 2020-10-17 | Discharge: 2020-10-17 | Disposition: A | Discharge status: Home / Self Care | Payer: Medicare HMO | Source: Ambulatory Visit | Attending: Nurse Practitioner | Admitting: Nurse Practitioner

## 2020-10-17 DIAGNOSIS — Z89511 Acquired absence of right leg below knee: Secondary | ICD-10-CM | POA: Diagnosis not present

## 2020-10-17 DIAGNOSIS — Z4781 Encounter for orthopedic aftercare following surgical amputation: Secondary | ICD-10-CM | POA: Diagnosis not present

## 2020-10-17 DIAGNOSIS — D630 Anemia in neoplastic disease: Secondary | ICD-10-CM | POA: Diagnosis not present

## 2020-10-17 DIAGNOSIS — I70221 Atherosclerosis of native arteries of extremities with rest pain, right leg: Secondary | ICD-10-CM | POA: Diagnosis not present

## 2020-10-17 DIAGNOSIS — D509 Iron deficiency anemia, unspecified: Secondary | ICD-10-CM | POA: Diagnosis not present

## 2020-10-17 DIAGNOSIS — C3411 Malignant neoplasm of upper lobe, right bronchus or lung: Secondary | ICD-10-CM | POA: Diagnosis not present

## 2020-10-17 DIAGNOSIS — C7931 Secondary malignant neoplasm of brain: Secondary | ICD-10-CM

## 2020-10-17 DIAGNOSIS — E1151 Type 2 diabetes mellitus with diabetic peripheral angiopathy without gangrene: Secondary | ICD-10-CM | POA: Diagnosis not present

## 2020-10-17 DIAGNOSIS — S81802D Unspecified open wound, left lower leg, subsequent encounter: Secondary | ICD-10-CM | POA: Diagnosis not present

## 2020-10-17 NOTE — Telephone Encounter (Signed)
Claire Shown social worker with [name] called and left a detailed voice message that she is asking for a referral to Palliative Care for the patient as he and his brother need the support.  I called Debbie back and left a detailed voice message to let her know that this process was already started on 10/13/2020 for the patient.

## 2020-10-17 NOTE — Telephone Encounter (Signed)
Received a fax from [facility] Phone # [phone] Fax # [phone]  Requesting an addendum added to virtual visit notes from 10/13/2020 and has to specifically say that the doctor agrees with need for a hospital bed and this is necessary for the patient because he can not use a regular bed due to double amputation and he is unable to reposition himself in the regular bed. Please stated that patient needs a semi-electric hospital bed with a trapeze bar in notes. I spoke to [facility] and she stated to use your best judgement on patients weight and height and add to the note also so they can get him the correct bed. (FYI they use bariatric bed for patients over 300lbs.)

## 2020-10-17 NOTE — Telephone Encounter (Signed)
Confirmation fax has been received.

## 2020-10-17 NOTE — Telephone Encounter (Signed)
I have faxed the addendum to [facility] at [phone] and awaiting the confirmation fax to come through.

## 2020-10-17 NOTE — Telephone Encounter (Signed)
Arby Barrette from [facility] called to verify patients weight and height. I gave her verification and Arby Barrette thanked me. [phone]

## 2020-10-17 NOTE — Addendum Note (Signed)
Addended by: Jeralyn Ruths E on: 10/17/2020 03:10 PM   Modules accepted: Orders

## 2020-10-18 ENCOUNTER — Other Ambulatory Visit: Payer: Self-pay | Admitting: Radiation Therapy

## 2020-10-18 DIAGNOSIS — C7931 Secondary malignant neoplasm of brain: Secondary | ICD-10-CM

## 2020-10-18 NOTE — Progress Notes (Deleted)
MR

## 2020-10-19 ENCOUNTER — Other Ambulatory Visit (INDEPENDENT_AMBULATORY_CARE_PROVIDER_SITE_OTHER): Payer: Self-pay | Admitting: Internal Medicine

## 2020-10-19 ENCOUNTER — Ambulatory Visit (INDEPENDENT_AMBULATORY_CARE_PROVIDER_SITE_OTHER): Payer: Medicare HMO | Admitting: *Deleted

## 2020-10-19 ENCOUNTER — Other Ambulatory Visit (INDEPENDENT_AMBULATORY_CARE_PROVIDER_SITE_OTHER): Payer: Medicare HMO

## 2020-10-19 ENCOUNTER — Encounter (INDEPENDENT_AMBULATORY_CARE_PROVIDER_SITE_OTHER): Payer: Self-pay

## 2020-10-19 ENCOUNTER — Telehealth (INDEPENDENT_AMBULATORY_CARE_PROVIDER_SITE_OTHER): Payer: Self-pay

## 2020-10-19 DIAGNOSIS — S81802D Unspecified open wound, left lower leg, subsequent encounter: Secondary | ICD-10-CM | POA: Diagnosis not present

## 2020-10-19 DIAGNOSIS — E119 Type 2 diabetes mellitus without complications: Secondary | ICD-10-CM

## 2020-10-19 DIAGNOSIS — D630 Anemia in neoplastic disease: Secondary | ICD-10-CM | POA: Diagnosis not present

## 2020-10-19 DIAGNOSIS — D509 Iron deficiency anemia, unspecified: Secondary | ICD-10-CM | POA: Diagnosis not present

## 2020-10-19 DIAGNOSIS — Z89511 Acquired absence of right leg below knee: Secondary | ICD-10-CM | POA: Diagnosis not present

## 2020-10-19 DIAGNOSIS — Z4781 Encounter for orthopedic aftercare following surgical amputation: Secondary | ICD-10-CM | POA: Diagnosis not present

## 2020-10-19 DIAGNOSIS — I739 Peripheral vascular disease, unspecified: Secondary | ICD-10-CM

## 2020-10-19 DIAGNOSIS — C3411 Malignant neoplasm of upper lobe, right bronchus or lung: Secondary | ICD-10-CM | POA: Diagnosis not present

## 2020-10-19 DIAGNOSIS — I70221 Atherosclerosis of native arteries of extremities with rest pain, right leg: Secondary | ICD-10-CM | POA: Diagnosis not present

## 2020-10-19 DIAGNOSIS — E1151 Type 2 diabetes mellitus with diabetic peripheral angiopathy without gangrene: Secondary | ICD-10-CM | POA: Diagnosis not present

## 2020-10-19 DIAGNOSIS — C7931 Secondary malignant neoplasm of brain: Secondary | ICD-10-CM | POA: Diagnosis not present

## 2020-10-19 MED ORDER — OXYCODONE HCL ER 10 MG PO T12A: 10.0000 mg | EXTENDED_RELEASE_TABLET | Freq: Two times a day (BID) | ORAL | 0 refills | Status: DC

## 2020-10-19 NOTE — Chronic Care Management (AMB) (Signed)
Chronic Care Management   CCM RN Visit Note  10/19/2020 Name: Thomas Garcia MRN: [id number] DOB: 03/10/1941  Subjective: Thomas Garcia is a 80 y.o. year old male who is a primary care patient of Gosrani, Doristine Johns, MD. The care management team was consulted for assistance with disease management and care coordination needs.    Engaged with patient by telephone for initial visit in response to provider referral for case management and/or care coordination services.   Consent to Services:  The patient was given the following information about Chronic Care Management services today, agreed to services, and gave verbal consent: 1. CCM service includes personalized support from designated clinical staff supervised by the primary care provider, including individualized plan of care and coordination with other care providers 2. 24/7 contact phone numbers for assistance for urgent and routine care needs. 3. Service will only be billed when office clinical staff spend 20 minutes or more in a month to coordinate care. 4. Only one practitioner may furnish and bill the service in a calendar month. 5.The patient may stop CCM services at any time (effective at the end of the month) by phone call to the office staff. 6. The patient will be responsible for cost sharing (co-pay) of up to 20% of the service fee (after annual deductible is met). Patient agreed to services and consent obtained.  Patient agreed to services and verbal consent obtained.   Assessment: Review of patient past medical history, allergies, medications, health status, including review of consultants reports, laboratory and other test data, was performed as part of comprehensive evaluation and provision of chronic care management services.   SDOH (Social Determinants of Health) assessments and interventions performed:  SDOH Interventions    Flowsheet Row Most Recent Value  SDOH Interventions   Food Insecurity Interventions Intervention  Not Indicated  Housing Interventions Intervention Not Indicated        CCM Care Plan  No Known Allergies  Outpatient Encounter Medications as of 10/19/2020  Medication Sig   acetaminophen (TYLENOL) 325 MG tablet Take 2 tablets (650 mg total) by mouth every 4 (four) hours as needed for headache or mild pain. (Patient taking differently: Take 650 mg by mouth daily.)   atorvastatin (LIPITOR) 10 MG tablet Take 1 tablet (10 mg total) by mouth daily at 6 PM.   Cal Carb-Mag Hydrox-Simeth (ROLAIDS ADVANCED) 1000-200-40 MG CHEW Chew 1 tablet by mouth daily as needed (for indigestion).   clopidogrel (PLAVIX) 75 MG tablet Take 1 tablet (75 mg total) by mouth daily.   ferrous sulfate 324 MG TBEC Take 324 mg by mouth daily with breakfast.   levETIRAcetam (KEPPRA) 500 MG tablet Take 1 tablet (500 mg total) by mouth 2 (two) times daily.   metFORMIN (GLUCOPHAGE) 500 MG tablet Take 1 tablet (500 mg total) by mouth 2 (two) times daily with a meal.   methocarbamol (ROBAXIN) 500 MG tablet Take 1 tablet (500 mg total) by mouth every 6 (six) hours as needed for muscle spasms.   oxyCODONE (OXYCONTIN) 10 mg 12 hr tablet Take 1 tablet (10 mg total) by mouth every 12 (twelve) hours.   pantoprazole (PROTONIX) 40 MG tablet Take 1 tablet (40 mg total) by mouth daily at 6 (six) AM.   traZODone (DESYREL) 50 MG tablet Take 1 tablet (50 mg total) by mouth at bedtime.   loperamide (IMODIUM) 2 MG capsule Take 1 capsule (2 mg total) by mouth as needed for diarrhea or loose stools. (Patient not taking: Reported on 10/19/2020)  No facility-administered encounter medications on file as of 10/19/2020.    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   Brain metastasis ([place])    Right-sided nontraumatic  intracerebral hemorrhage (HCC)    Pulmonary embolism ([place]) 06/27/2020   Brain mass 06/26/2020   Hyponatremia 06/26/2020   Microcytic anemia 06/26/2020   Cavitating mass in right upper lung lobe 05/27/2020   Colon cancer screening 06/22/2019   Abnormality of gait 11/06/2018   Diabetes mellitus type 2 in nonobese [facility])    Phantom limb pain (HCC)    Postoperative pain    Unilateral complete BKA, left, sequela (HCC)    Acute blood loss anemia    Essential hypertension    Neuropathic pain    Unilateral complete BKA, left, initial encounter ([place])    Tobacco abuse    Benign essential HTN    Diabetic peripheral neuropathy (HCC)    Post-operative pain    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[place])    PVD (peripheral vascular disease) ([place])    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 ([place]) 09/08/2018   Osteomyelitis of second toe of left foot ([place]) 09/08/2018   Cellulitis of left foot 09/08/2018   Leukocytosis 09/08/2018   Fever and chills 09/08/2018   MOLE 10/26/2008   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 ([place]) 10/26/2008   OVERWEIGHT 10/26/2008   NICOTINE ADDICTION 10/26/2008   ACUTE CYSTITIS 10/26/2008   FATIGUE 10/26/2008   COUGH 10/26/2008   ELECTROCARDIOGRAM, ABNORMAL 10/26/2008    Conditions to be addressed/monitored:DMII, PVD  Care Plan : Peripheral Vascular Disease  Updates made by Kassie Mends, RN since 10/19/2020 12:00 AM     Problem: Health Promotion or Disease Self-Management related to PVD   Priority: High     Long-Range Goal: Self-Management Plan Developed for PVD   Start Date: 10/19/2020  Expected End Date: 04/09/2021  This Visit's Progress: On track  Priority: High  Note:   Current Barriers:  Ineffective Self Health Maintenance in a patient with DMII and PVD, pt reports he smokes 2 cigarettes per day but has been unable to completely quit, pt is in bed a lot and gets up with assistance, has home health PT, RN, MSW.  Pt is bil BKA amputee with most recent surgery  6/22, pt recently fell and hit incision site, reports RN from [place] home health provides wound care with brother assisting, pt wants to stay at home, brother states they are managing at present and will talk with MSW if any issues, declines LCSW at present due to MSW involvement from [place].  Pt reports he is drinking one Ensure daily.  Pt is now on oxycontin for incisional pain to right leg.  Primary care provider is placing virtual behavioral health and psychiatry referral due to some depression,  pt reports today " I'm not really all that depressed today"  Pt has history of anemia. Unable to independently drive, patient lives with brother, also utilizes [employer] transportation for doctor's appointments Does not adhere to provider recommendations re: smoking Unable to perform ADLs independently- pt relies on his brother (primary caregiver) for assistance with bathing, dressing, meal preparation, etc. There is no other help from family members. Clinical Goal(s):  Collaboration with Anastasio Champion, Nimish  C, MD regarding development and update of comprehensive plan of care as evidenced by provider attestation and co-signature Inter-disciplinary care team collaboration (see longitudinal plan of care) patient will work with care management team to address care coordination and chronic disease management needs related to PVD, DM2. Interventions:  Evaluation of current treatment plan related to peripheral vascular disease, ADL IADL limitations self-management and patient's adherence to plan as established by provider. Collaboration with Doree Albee, MD regarding development and update of comprehensive plan of care as evidenced by provider attestation       and co-signature Inter-disciplinary care team collaboration (see longitudinal plan of care) Discussed plans with patient for ongoing care management follow up and provided patient with direct contact information for care management team Reviewed all  medications and importance of taking as prescribed. Reinforced importance of taking iron supplements as directed by MD Mailed Advanced directives packet to patient's home Education for Advanced directives and hypoglycemia sent to pt via My Chart Reviewed importance of smoking cessation and effect on PVD Reviewed signs/ symptoms of infection with incision and importance of reporting to doctor if incision not healing well Reviewed safety precautions and importance of working through plan of care with home health Reviewed LCSW services provided and pt is currently going to work with MSW through [facility] [employer]:  Attends all scheduled provider appointments Calls pharmacy for medication refills Calls provider office for new concerns or questions Patient Goals: - check out options for in-home help, long-term care or hospice if you so desire, talk with social worker from home health, if you need further assistance contact RN care manager at [phone] - complete a living will and HCPOA- documents mailed to you - discuss my treatment options with the doctor or nurse - make shared treatment decisions with doctor - smoking worsens peripheral vascular disease - work with home health PT for conditioning and to prevent falls - be careful with transfers, keep pathways clear, remove cords, etc. - call your doctor if amputation incision site appears infected or not healing well Follow Up Plan: Telephone follow up appointment with care management team member scheduled for:     Care Plan : Diabetes Type 2 (Adult)  Updates made by Kassie Mends, RN since 10/19/2020 12:00 AM     Problem: Glycemic Management (Diabetes, Type 2)   Priority: Medium     Long-Range Goal: Glycemic Management Optimized   Start Date: 10/19/2020  Expected End Date: 04/21/2021  This Visit's Progress: On track  Priority: Medium  Note:   Objective:  Lab Results  Component Value Date   HGBA1C 5.5  09/21/2020   Lab Results  Component Value Date   CREATININE 0.54 (L) 09/30/2020   CREATININE 0.56 (L) 09/29/2020   CREATININE 0.53 (L) 09/28/2020   No results found for: EGFR Current Barriers:  Knowledge Deficits related to basic Diabetes pathophysiology and self care/management- reinforcement of ADA diet/ importance of checking CBG daily, pt checks approximately once weekly with readings <200 per pt.  Pt has history anemia, lung / brain cancer. Does not adhere to provider recommendations re: checking CBG daily Unable to perform ADLs independently- pt with bil BKA and in bed most of time, lives with brother who is primary caregiver and assists pt.   Unable to perform IADLs independently- brother assists Case Manager Clinical Goal(s):  patient will demonstrate improved adherence to prescribed treatment plan for diabetes self care/management as evidenced by: daily monitoring and recording of CBG  adherence  to ADA/ carb modified diet contacting provider for new or worsened symptoms or questions Interventions:  Collaboration with Doree Albee, MD regarding development and update of comprehensive plan of care as evidenced by provider attestation and co-signature Inter-disciplinary care team collaboration (see longitudinal plan of care) Provided education to patient about basic DM disease process Reviewed medications with patient and discussed importance of medication adherence Discussed plans with patient for ongoing care management follow up and provided patient with direct contact information for care management team Provided patient with written educational materials related to hypo and hyperglycemia and importance of correct treatment Reviewed scheduled/upcoming provider appointments including: vascular surgery Monica Martinez 7/26 and primary care provider 8/25 Review of patient status, including review of consultants reports, relevant laboratory and other test results, and medications  completed. Reviewed importance of checking CBG daily and recording Provided education via My Chart hypoglycemia Self-Care Activities - Attends all scheduled provider appointments Checks blood sugars as prescribed and utilize hyper and hypoglycemia protocol as needed Adheres to prescribed ADA/carb modified Patient Goals - check blood sugar at prescribed times - check blood sugar if I feel it is too high or too low - enter blood sugar readings and medication or insulin into daily log - take the blood sugar log to all doctor visits - take the blood sugar meter to all doctor visits  - be mindful of foods high in carbohydrates such as bread, rice, potatoes, pasta that will elevate blood sugar - elevated blood sugar can delay healing - please look over education sent via My Chart- hypoglycemia Follow Up Plan: Telephone follow up appointment with care management team member scheduled for:   11/16/2020     Plan:Telephone follow up appointment with care management team member scheduled for:  11/16/2020  Jacqlyn Larsen [facility], BSN RN Case Manager [place] [phone]

## 2020-10-19 NOTE — Telephone Encounter (Signed)
Since he is going to be having ongoing pain and a history of cancer, I have switched his pain medication to OxyContin every 12 hours which is a long-acting opioid.  I have sent this to [place].  Hopefully this will control his pain better.

## 2020-10-19 NOTE — Patient Instructions (Signed)
Visit Information   PATIENT GOALS:   Goals Addressed             This Visit's Progress    Maintain My Quality of Life related to PVD       Timeframe:  Long-Range Goal Priority:  High Start Date:        10/19/2020                     Expected End Date:      04/21/2021                 Follow Up Date - 11/16/2020   - check out options for in-home help, long-term care or hospice if you so desire, talk with social worker from home health, if you need further assistance contact RN care manager at [phone] - complete a living will and HCPOA- documents mailed to you - discuss my treatment options with the doctor or nurse - make shared treatment decisions with doctor - smoking worsens peripheral vascular disease - work with home health PT for conditioning and to prevent falls - be careful with transfers, keep pathways clear, remove cords, etc. - call your doctor if amputation incision site appears infected or not healing well    Why is this important?   Having a long-term illness can be scary.  It can also be stressful for you and your caregiver.  These steps may help.    Notes:       Monitor and Manage My Blood Sugar-Diabetes Type 2       Timeframe:  Long-Range Goal Priority:  Medium Start Date:           10/19/2020                  Expected End Date:      04/21/2021                 Follow Up Date- 11/16/2021    - check blood sugar at prescribed times - check blood sugar if I feel it is too high or too low - enter blood sugar readings and medication or insulin into daily log - take the blood sugar log to all doctor visits - take the blood sugar meter to all doctor visits  - be mindful of foods high in carbohydrates such as bread, rice, potatoes, pasta that will elevate blood sugar - elevated blood sugar can delay healing - please look over education sent via My Chart- hypoglycemia   Why is this important?   Checking your blood sugar at home helps to keep it from getting  very high or very low.  Writing the results in a diary or log helps the doctor know how to care for you.  Your blood sugar log should have the time, date and the results.  Also, write down the amount of insulin or other medicine that you take.  Other information, like what you ate, exercise done and how you were feeling, will also be helpful.     Notes:          Consent to CCM Services: Thomas Garcia was given information about Chronic Care Management services today including:  CCM service includes personalized support from designated clinical staff supervised by his physician, including individualized plan of care and coordination with other care providers 24/7 contact phone numbers for assistance for urgent and routine care needs. Service will only be billed when office clinical staff spend 20 minutes or more  in a month to coordinate care. Only one practitioner may furnish and bill the service in a calendar month. The patient may stop CCM services at any time (effective at the end of the month) by phone call to the office staff. The patient will be responsible for cost sharing (co-pay) of up to 20% of the service fee (after annual deductible is met).  Patient agreed to services and verbal consent obtained.   Patient verbalizes understanding of instructions provided today and agrees to view in [place].   Telephone follow up appointment with care management team member scheduled for:  8/10/2022Critical care medicine: Principles of diagnosis and management in the adult (4th ed., pp. [phone]). [employer]unders."> Miller's anesthesia (8th ed., pp. [phone]). Saunders.">  Advance Directive  Advance directives are legal documents that allow you to make decisions about your health care and medical treatment in case you become unable to communicate for yourself. Advance directives let your wishes be known to family, friends,and health care providers. Discussing and writing advance directives should  happen over time rather than all at once. Advance directives can be changed and updated at any time. There are different types of advance directives, such as: Medical power of attorney. Living will. Do not resuscitate (DNR) order or do not attempt resuscitation (DNAR) order. Health care proxy and medical power of attorney A health care proxy is also called a health care agent. This person is appointed to make medical decisions for you when you are unable to make decisions for yourself. Generally, people ask a trusted friend or family member to act as their proxy and represent their preferences. Make sure you have an agreement with your trusted person to act as your proxy. A proxy may have tomake a medical decision on your behalf if your wishes are not known. A medical power of attorney, also called a durable power of attorney for health care, is a legal document that names your health care proxy. Depending on the laws in your state, the document may need to be: Signed. Notarized. Dated. Copied. Witnessed. Incorporated into your medical record. You may also want to appoint a trusted person to manage your money in the event you are unable to do so. This is called a durable power of attorney for finances. It is a separate legal document from the durable power of attorney for health care. You may choose your health care proxy or someone different toact as your agent in money matters. If you do not appoint a proxy, or there is a concern that the proxy is not acting in your best interest, a court may appoint a guardian to act on yourbehalf. Living will A living will is a set of instructions that state your wishes about medical care when you cannot express them yourself. Health care providers should keep a copy of your living will in your medical record. You may want to give a copy to family members or friends. To alert caregivers in case of an emergency, you can place a card in your wallet to let them know  that you have a living will and where they can find it. A living will is used if you become: Terminally ill. Disabled. Unable to communicate or make decisions. The following decisions should be included in your living will: To use or not to use life support equipment, such as dialysis machines and breathing machines (ventilators). Whether you want a DNR or DNAR order. This tells health care providers not to use cardiopulmonary resuscitation (CPR) if  breathing or heartbeat stops. To use or not to use tube feeding. To be given or not to be given food and fluids. Whether you want comfort (palliative) care when the goal becomes comfort rather than a cure. Whether you want to donate your organs and tissues. A living will does not give instructions for distributing your money andproperty if you should pass away. DNR or DNAR A DNR or DNAR order is a request not to have CPR in the event that your heart stops beating or you stop breathing. If a DNR or DNAR order has not been made and shared, a health care provider will try to help any patient whose heart has stopped or who has stopped breathing. If you plan to have surgery, talk with your health care provider about how your DNR or DNAR order will be followed ifproblems occur. What if I do not have an advance directive? Some states assign family decision makers to act on your behalf if you do not have an advance directive. Each state has its own laws about advance directives. You may want to check with your health care provider, attorney, orstate representative about the laws in your state. Summary Advance directives are legal documents that allow you to make decisions about your health care and medical treatment in case you become unable to communicate for yourself. The process of discussing and writing advance directives should happen over time. You can change and update advance directives at any time. Advance directives may include a medical power of  attorney, a living will, and a DNR or DNAR order. This information is not intended to replace advice given to you by your health care provider. Make sure you discuss any questions you have with your healthcare provider. Document Revised: 12/29/2019 Document Reviewed: 12/29/2019 Elsevier Patient Education  2022 [employer]. Hypoglycemia Hypoglycemia is when the sugar (glucose) level in your blood is too low. Low blood sugar can happen to people who have diabetes and people who do not have diabetes. Low blood sugar can happenquickly, and it can be an emergency. What are the causes? This condition happens most often in people who have diabetes. It may be caused by: Diabetes medicine. Not eating enough, or not eating often enough. Doing more physical activity. Drinking alcohol on an empty stomach. If you do not have diabetes, this condition may be caused by: A tumor in the pancreas. Not eating enough, or not eating for long periods at a time (fasting). A very bad infection or illness. Problems after having weight loss (bariatric) surgery. Kidney failure or liver failure. Certain medicines. What increases the risk? This condition is more likely to develop in people who: Have diabetes and take medicines to lower their blood sugar. Abuse alcohol. Have a very bad illness. What are the signs or symptoms? Mild Hunger. Sweating and feeling clammy. Feeling dizzy or light-headed. Being sleepy or having trouble sleeping. Feeling like you may vomit (nauseous). A fast heartbeat. A headache. Blurry vision. Mood changes, such as: Being grouchy. Feeling worried or nervous (anxious). Tingling or loss of feeling (numbness) around your mouth, lips, or tongue. Moderate Confusion and poor judgment. Behavior changes. Weakness. Uneven heartbeat. Trouble with moving (coordination). Very low Very low blood sugar (severe hypoglycemia) is a medical emergency. It can  cause: Fainting. Seizures. Loss of consciousness (coma). Death. How is this treated? Treating low blood sugar Low blood sugar is often treated by eating or drinking something that has sugar in it right away. The food or drink should  contain 15 grams of a fast-acting carb (carbohydrate). Options include: 4 oz (120 mL) of fruit juice. 4 oz (120 mL) of regular soda (not diet soda). A few pieces of hard candy. Check food labels to see how many pieces to eat for 15 grams. 1 Tbsp (15 mL) of sugar or honey. 4 glucose tablets. 1 tube of glucose gel. Treating low blood sugar if you have diabetes If you can think clearly and swallow safely, follow the 15:15 rule: Take 15 grams of a fast-acting carb. Talk with your doctor about how much you should take. Always keep a source of fast-acting carb with you, such as: Glucose tablets (take 4 tablets). A few pieces of hard candy. Check food labels to see how many pieces to eat for 15 grams. 4 oz (120 mL) of fruit juice. 4 oz (120 mL) of regular soda (not diet soda). 1 Tbsp (15 mL) of honey or sugar. 1 tube of glucose gel. Check your blood sugar 15 minutes after you take the carb. If your blood sugar is still at or below 70 mg/dL (3.9 mmol/L), take 15 grams of a carb again. If your blood sugar does not go above 70 mg/dL (3.9 mmol/L) after 3 tries, get help right away. After your blood sugar goes back to normal, eat a meal or a snack within 1 hour.  Treating very low blood sugar If your blood sugar is below 54 mg/dL (3 mmol/L), you have very low blood sugar, or severe hypoglycemia. This is an emergency. Get medical help right away. If you have very low blood sugar and you cannot eat or drink, you will need to be given a hormone called glucagon. A family member or friend should learn how to check your blood sugar and how to give you glucagon. Ask your doctor if youneed to have an emergency glucagon kit at home. Very low blood sugar may also need to be  treated in a hospital. Follow these instructions at home: General instructions Take over-the-counter and prescription medicines only as told by your doctor. Stay aware of your blood sugar as told by your doctor. If you drink alcohol: Limit how much you have to: 0-1 drink a day for women who are not pregnant. 0-2 drinks a day for men. Know how much alcohol is in your drink. In the [place] one drink equals one 12 oz bottle of beer (355 mL), one 5 oz glass of wine (148 mL), or one 1 oz glass of hard liquor (44 mL). Be sure to eat food when you drink alcohol. Know that your body absorbs alcohol quickly. This may lead to low blood sugar later. Be sure to keep checking your blood sugar. Keep all follow-up visits. If you have diabetes:  Always have a fast-acting carb (15 grams) with you to treat low blood sugar. Follow your diabetes care plan as told by your doctor. Make sure you: Know the symptoms of low blood sugar. Check your blood sugar as often as told. Always check it before and after exercise. Always check your blood sugar before you drive. Take your medicines as told. Follow your meal plan. Eat on time. Do not skip meals. Share your diabetes care plan with: Your work or school. People you live with. Carry a card or wear jewelry that says you have diabetes.  Where to find more information American Diabetes Association: www.diabetes.org Contact a doctor if: You have trouble keeping your blood sugar in your target range. You have low blood sugar often. Get  help right away if: You still have symptoms after you eat or drink something that contains 15 grams of fast-acting carb, and you cannot get your blood sugar above 70 mg/dL by following the 15:15 rule. Your blood sugar is below 54 mg/dL (3 mmol/L). You have a seizure. You faint. These symptoms may be an emergency. Get help right away. Call your local emergency services (911 in the [place] Do not wait to see if the symptoms will go  away. Do not drive yourself to the hospital. Summary Hypoglycemia happens when the level of sugar (glucose) in your blood is too low. Low blood sugar can happen to people who have diabetes and people who do not have diabetes. Low blood sugar can happen quickly, and it can be an emergency. Make sure you know the symptoms of low blood sugar and know how to treat it. Always keep a source of sugar (fast-acting carb) with you to treat low blood sugar. This information is not intended to replace advice given to you by your health care provider. Make sure you discuss any questions you have with your healthcare provider. Document Revised: 02/25/2020 Document Reviewed: 02/25/2020 Elsevier Patient Education  2022 [facility], BSN RN Case Manager Anastasio [facility] [phone]    CLINICAL CARE PLAN: Patient Care Plan: Peripheral Vascular Disease     Problem Identified: Health Promotion or Disease Self-Management related to PVD   Priority: High     Long-Range Goal: Self-Management Plan Developed for PVD   Start Date: 10/19/2020  Expected End Date: 04/09/2021  This Visit's Progress: On track  Priority: High  Note:   Current Barriers:  Ineffective Self Health Maintenance in a patient with DMII and PVD, pt reports he smokes 2 cigarettes per day but has been unable to completely quit, pt is in bed a lot and gets up with assistance, has home health PT, RN, MSW.  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  Pt reports he is drinking one Ensure daily.  Pt is now on oxycontin for incisional pain to right leg.  Primary care provider is placing virtual behavioral health and psychiatry  referral due to some depression,  pt reports today " I'm not really all that depressed today"  Pt has history of anemia. Unable to independently drive, patient lives with brother, also utilizes [facility] transportation for doctor's appointments Does not adhere to provider recommendations re: smoking Unable to perform ADLs independently- pt relies on his brother (primary caregiver) for assistance with bathing, dressing, meal preparation, etc. There is no other help from family members. Clinical Goal(s):  Collaboration with Doree Albee, MD regarding development and update of comprehensive plan of care as evidenced by provider attestation and co-signature Inter-disciplinary care team collaboration (see longitudinal plan of care) patient will work with care management team to address care coordination and chronic disease management needs related to PVD, DM2. Interventions:  Evaluation of current treatment plan related to peripheral vascular disease, ADL IADL limitations self-management and patient's adherence to plan as established by provider. Collaboration with Doree Albee, MD regarding development and update of comprehensive plan of care as evidenced by provider attestation  and co-signature Inter-disciplinary care team collaboration (see longitudinal plan of care) Discussed plans with patient for ongoing care management follow up and provided patient with direct contact information for care management team Reviewed all medications and importance of taking as prescribed. Reinforced importance of taking iron supplements as directed by MD Mailed Advanced directives packet to patient's home Education for Advanced directives and hypoglycemia sent to pt via My Chart Reviewed importance of smoking cessation and effect on PVD Reviewed signs/ symptoms of infection with incision and importance of reporting to doctor if incision not healing well Reviewed safety precautions and importance of  working through plan of care with home health Reviewed LCSW services provided and pt is currently going to work with MSW through [place] [facility] [employer]:  Attends all scheduled provider appointments Calls pharmacy for medication refills Calls provider office for new concerns or questions Patient Goals: - check out options for in-home help, long-term care or hospice if you so desire, talk with social worker from home health, if you need further assistance contact RN care manager at [phone] - complete a living will and HCPOA- documents mailed to you - discuss my treatment options with the doctor or nurse - make shared treatment decisions with doctor - smoking worsens peripheral vascular disease - work with home health PT for conditioning and to prevent falls - be careful with transfers, keep pathways clear, remove cords, etc. - call your doctor if amputation incision site appears infected or not healing well Follow Up Plan: Telephone follow up appointment with care management team member scheduled for:     Patient Care Plan: Diabetes Type 2 (Adult)     Problem Identified: Glycemic Management (Diabetes, Type 2)   Priority: Medium     Long-Range Goal: Glycemic Management Optimized   Start Date: 10/19/2020  Expected End Date: 04/21/2021  This Visit's Progress: On track  Priority: Medium  Note:   Objective:  Lab Results  Component Value Date   HGBA1C 5.5 09/21/2020   Lab Results  Component Value Date   CREATININE 0.54 (L) 09/30/2020   CREATININE 0.56 (L) 09/29/2020   CREATININE 0.53 (L) 09/28/2020   No results found for: EGFR Current Barriers:  Knowledge Deficits related to basic Diabetes pathophysiology and self care/management- reinforcement of ADA diet/ importance of checking CBG daily, pt checks approximately once weekly with readings <200 per pt.  Pt has history anemia, lung / brain cancer. Does not adhere to provider recommendations re: checking CBG  daily Unable to perform ADLs independently- pt with bil BKA and in bed most of time, lives with brother who is primary caregiver and assists pt.   Unable to perform IADLs independently- brother assists Case Manager Clinical Goal(s):  patient will demonstrate improved adherence to prescribed treatment plan for diabetes self care/management as evidenced by: daily monitoring and recording of CBG  adherence to ADA/ carb modified diet contacting provider for new or worsened symptoms or questions Interventions:  Collaboration with Doree Albee, MD regarding development and update of comprehensive plan of care as evidenced by provider attestation and co-signature Inter-disciplinary care team collaboration (see longitudinal plan of care) Provided education to patient about basic DM disease process Reviewed medications with patient and discussed importance of medication adherence Discussed plans with patient for ongoing care management follow up and provided patient with direct contact information for care management team Provided patient with written educational materials related to hypo and hyperglycemia and importance of correct treatment Reviewed scheduled/upcoming provider appointments including: vascular  surgery Monica Martinez 7/26 and primary care provider 8/25 Review of patient status, including review of consultants reports, relevant laboratory and other test results, and medications completed. Reviewed importance of checking CBG daily and recording Provided education via My Chart hypoglycemia Self-Care Activities - Attends all scheduled provider appointments Checks blood sugars as prescribed and utilize hyper and hypoglycemia protocol as needed Adheres to prescribed ADA/carb modified Patient Goals - check blood sugar at prescribed times - check blood sugar if I feel it is too high or too low - enter blood sugar readings and medication or insulin into daily log - take the blood sugar  log to all doctor visits - take the blood sugar meter to all doctor visits  - be mindful of foods high in carbohydrates such as bread, rice, potatoes, pasta that will elevate blood sugar - elevated blood sugar can delay healing - please look over education sent via My Chart- hypoglycemia Follow Up Plan: Telephone follow up appointment with care management team member scheduled for:   11/16/2020

## 2020-10-19 NOTE — Telephone Encounter (Signed)
Patients brother [name], his caregiver, came in today requesting a refill of the following medication for the recent amputation of his other leg since he is almost out and knows we are not here on the weekend. [facility] in [place].  oxyCODONE-acetaminophen (PERCOCET) 5-325 MG tablet  Last filled 10/05/2020, # 30 with 0 refills

## 2020-10-19 NOTE — Telephone Encounter (Signed)
Called patients brother [name] and gave him the message and Ed was able to hear due to having on speaker phone. They both verbalized an understanding and thanked [name].

## 2020-10-20 ENCOUNTER — Telehealth: Payer: Self-pay | Admitting: *Deleted

## 2020-10-20 NOTE — Telephone Encounter (Signed)
[facility] home health nurse called to report signs of infection in pts right BKA stump. Called patient to schedule appt and left VM to call back.

## 2020-10-21 ENCOUNTER — Emergency Department (HOSPITAL_COMMUNITY): Payer: Medicare HMO

## 2020-10-21 ENCOUNTER — Telehealth: Payer: Self-pay

## 2020-10-21 ENCOUNTER — Other Ambulatory Visit: Payer: Self-pay

## 2020-10-21 ENCOUNTER — Inpatient Hospital Stay (HOSPITAL_COMMUNITY)
Admission: EM | Admit: 2020-10-21 | Discharge: 2020-11-02 | DRG: 474 | Disposition: A | Discharge status: Home Health | Payer: Medicare HMO | Attending: Internal Medicine | Admitting: Internal Medicine

## 2020-10-21 DIAGNOSIS — Z89512 Acquired absence of left leg below knee: Secondary | ICD-10-CM

## 2020-10-21 DIAGNOSIS — I2699 Other pulmonary embolism without acute cor pulmonale: Secondary | ICD-10-CM | POA: Diagnosis not present

## 2020-10-21 DIAGNOSIS — K5731 Diverticulosis of large intestine without perforation or abscess with bleeding: Secondary | ICD-10-CM | POA: Diagnosis not present

## 2020-10-21 DIAGNOSIS — I82512 Chronic embolism and thrombosis of left femoral vein: Secondary | ICD-10-CM | POA: Diagnosis present

## 2020-10-21 DIAGNOSIS — C801 Malignant (primary) neoplasm, unspecified: Secondary | ICD-10-CM | POA: Diagnosis not present

## 2020-10-21 DIAGNOSIS — Z87442 Personal history of urinary calculi: Secondary | ICD-10-CM

## 2020-10-21 DIAGNOSIS — Z7902 Long term (current) use of antithrombotics/antiplatelets: Secondary | ICD-10-CM

## 2020-10-21 DIAGNOSIS — I10 Essential (primary) hypertension: Secondary | ICD-10-CM | POA: Diagnosis present

## 2020-10-21 DIAGNOSIS — M79605 Pain in left leg: Secondary | ICD-10-CM | POA: Diagnosis not present

## 2020-10-21 DIAGNOSIS — K449 Diaphragmatic hernia without obstruction or gangrene: Secondary | ICD-10-CM | POA: Diagnosis not present

## 2020-10-21 DIAGNOSIS — T8743 Infection of amputation stump, right lower extremity: Secondary | ICD-10-CM | POA: Diagnosis not present

## 2020-10-21 DIAGNOSIS — I959 Hypotension, unspecified: Secondary | ICD-10-CM | POA: Diagnosis not present

## 2020-10-21 DIAGNOSIS — N281 Cyst of kidney, acquired: Secondary | ICD-10-CM | POA: Diagnosis not present

## 2020-10-21 DIAGNOSIS — C3411 Malignant neoplasm of upper lobe, right bronchus or lung: Secondary | ICD-10-CM | POA: Diagnosis not present

## 2020-10-21 DIAGNOSIS — L03115 Cellulitis of right lower limb: Secondary | ICD-10-CM | POA: Diagnosis present

## 2020-10-21 DIAGNOSIS — Z79899 Other long term (current) drug therapy: Secondary | ICD-10-CM

## 2020-10-21 DIAGNOSIS — D649 Anemia, unspecified: Secondary | ICD-10-CM

## 2020-10-21 DIAGNOSIS — C7931 Secondary malignant neoplasm of brain: Secondary | ICD-10-CM | POA: Diagnosis not present

## 2020-10-21 DIAGNOSIS — Z6835 Body mass index (BMI) 35.0-35.9, adult: Secondary | ICD-10-CM | POA: Diagnosis not present

## 2020-10-21 DIAGNOSIS — Z89511 Acquired absence of right leg below knee: Secondary | ICD-10-CM | POA: Diagnosis not present

## 2020-10-21 DIAGNOSIS — K31819 Angiodysplasia of stomach and duodenum without bleeding: Secondary | ICD-10-CM | POA: Diagnosis present

## 2020-10-21 DIAGNOSIS — J90 Pleural effusion, not elsewhere classified: Secondary | ICD-10-CM | POA: Diagnosis not present

## 2020-10-21 DIAGNOSIS — F17210 Nicotine dependence, cigarettes, uncomplicated: Secondary | ICD-10-CM | POA: Diagnosis present

## 2020-10-21 DIAGNOSIS — E11649 Type 2 diabetes mellitus with hypoglycemia without coma: Secondary | ICD-10-CM | POA: Diagnosis not present

## 2020-10-21 DIAGNOSIS — T8149XA Infection following a procedure, other surgical site, initial encounter: Secondary | ICD-10-CM | POA: Diagnosis not present

## 2020-10-21 DIAGNOSIS — S81802D Unspecified open wound, left lower leg, subsequent encounter: Secondary | ICD-10-CM | POA: Diagnosis not present

## 2020-10-21 DIAGNOSIS — Z7984 Long term (current) use of oral hypoglycemic drugs: Secondary | ICD-10-CM | POA: Diagnosis not present

## 2020-10-21 DIAGNOSIS — D630 Anemia in neoplastic disease: Secondary | ICD-10-CM | POA: Diagnosis present

## 2020-10-21 DIAGNOSIS — I70338 Atherosclerosis of unspecified type of bypass graft(s) of the right leg with ulceration of other part of lower leg: Secondary | ICD-10-CM | POA: Diagnosis not present

## 2020-10-21 DIAGNOSIS — Z4781 Encounter for orthopedic aftercare following surgical amputation: Secondary | ICD-10-CM | POA: Diagnosis not present

## 2020-10-21 DIAGNOSIS — Y835 Amputation of limb(s) as the cause of abnormal reaction of the patient, or of later complication, without mention of misadventure at the time of the procedure: Secondary | ICD-10-CM | POA: Diagnosis present

## 2020-10-21 DIAGNOSIS — N210 Calculus in bladder: Secondary | ICD-10-CM | POA: Diagnosis not present

## 2020-10-21 DIAGNOSIS — L97829 Non-pressure chronic ulcer of other part of left lower leg with unspecified severity: Secondary | ICD-10-CM | POA: Diagnosis not present

## 2020-10-21 DIAGNOSIS — K6389 Other specified diseases of intestine: Secondary | ICD-10-CM

## 2020-10-21 DIAGNOSIS — E871 Hypo-osmolality and hyponatremia: Secondary | ICD-10-CM | POA: Diagnosis not present

## 2020-10-21 DIAGNOSIS — T8789 Other complications of amputation stump: Secondary | ICD-10-CM | POA: Diagnosis not present

## 2020-10-21 DIAGNOSIS — T8753 Necrosis of amputation stump, right lower extremity: Secondary | ICD-10-CM | POA: Diagnosis not present

## 2020-10-21 DIAGNOSIS — Z86711 Personal history of pulmonary embolism: Secondary | ICD-10-CM | POA: Diagnosis not present

## 2020-10-21 DIAGNOSIS — E1151 Type 2 diabetes mellitus with diabetic peripheral angiopathy without gangrene: Secondary | ICD-10-CM | POA: Diagnosis not present

## 2020-10-21 DIAGNOSIS — E1140 Type 2 diabetes mellitus with diabetic neuropathy, unspecified: Secondary | ICD-10-CM | POA: Diagnosis present

## 2020-10-21 DIAGNOSIS — D72829 Elevated white blood cell count, unspecified: Secondary | ICD-10-CM | POA: Diagnosis not present

## 2020-10-21 DIAGNOSIS — K31811 Angiodysplasia of stomach and duodenum with bleeding: Secondary | ICD-10-CM | POA: Diagnosis not present

## 2020-10-21 DIAGNOSIS — A419 Sepsis, unspecified organism: Secondary | ICD-10-CM | POA: Diagnosis not present

## 2020-10-21 DIAGNOSIS — E1152 Type 2 diabetes mellitus with diabetic peripheral angiopathy with gangrene: Secondary | ICD-10-CM | POA: Diagnosis not present

## 2020-10-21 DIAGNOSIS — L089 Local infection of the skin and subcutaneous tissue, unspecified: Secondary | ICD-10-CM | POA: Diagnosis not present

## 2020-10-21 DIAGNOSIS — R269 Unspecified abnormalities of gait and mobility: Secondary | ICD-10-CM | POA: Diagnosis not present

## 2020-10-21 DIAGNOSIS — M726 Necrotizing fasciitis: Secondary | ICD-10-CM | POA: Diagnosis not present

## 2020-10-21 DIAGNOSIS — C784 Secondary malignant neoplasm of small intestine: Secondary | ICD-10-CM | POA: Diagnosis not present

## 2020-10-21 DIAGNOSIS — Z7401 Bed confinement status: Secondary | ICD-10-CM | POA: Diagnosis not present

## 2020-10-21 DIAGNOSIS — M7989 Other specified soft tissue disorders: Secondary | ICD-10-CM | POA: Diagnosis not present

## 2020-10-21 DIAGNOSIS — R195 Other fecal abnormalities: Secondary | ICD-10-CM | POA: Diagnosis not present

## 2020-10-21 DIAGNOSIS — K921 Melena: Secondary | ICD-10-CM | POA: Diagnosis present

## 2020-10-21 DIAGNOSIS — E876 Hypokalemia: Secondary | ICD-10-CM | POA: Diagnosis not present

## 2020-10-21 DIAGNOSIS — Z833 Family history of diabetes mellitus: Secondary | ICD-10-CM

## 2020-10-21 DIAGNOSIS — K635 Polyp of colon: Secondary | ICD-10-CM | POA: Diagnosis not present

## 2020-10-21 DIAGNOSIS — M79606 Pain in leg, unspecified: Secondary | ICD-10-CM | POA: Diagnosis not present

## 2020-10-21 DIAGNOSIS — K3189 Other diseases of stomach and duodenum: Secondary | ICD-10-CM | POA: Diagnosis not present

## 2020-10-21 DIAGNOSIS — Z9119 Patient's noncompliance with other medical treatment and regimen: Secondary | ICD-10-CM

## 2020-10-21 DIAGNOSIS — R52 Pain, unspecified: Secondary | ICD-10-CM | POA: Diagnosis not present

## 2020-10-21 DIAGNOSIS — D509 Iron deficiency anemia, unspecified: Secondary | ICD-10-CM | POA: Diagnosis not present

## 2020-10-21 DIAGNOSIS — Z9081 Acquired absence of spleen: Secondary | ICD-10-CM

## 2020-10-21 DIAGNOSIS — E43 Unspecified severe protein-calorie malnutrition: Secondary | ICD-10-CM | POA: Diagnosis not present

## 2020-10-21 DIAGNOSIS — T148XXA Other injury of unspecified body region, initial encounter: Secondary | ICD-10-CM | POA: Diagnosis present

## 2020-10-21 DIAGNOSIS — E669 Obesity, unspecified: Secondary | ICD-10-CM | POA: Diagnosis present

## 2020-10-21 DIAGNOSIS — Z66 Do not resuscitate: Secondary | ICD-10-CM | POA: Diagnosis not present

## 2020-10-21 DIAGNOSIS — R0902 Hypoxemia: Secondary | ICD-10-CM | POA: Diagnosis not present

## 2020-10-21 DIAGNOSIS — I619 Nontraumatic intracerebral hemorrhage, unspecified: Secondary | ICD-10-CM | POA: Diagnosis not present

## 2020-10-21 DIAGNOSIS — K5730 Diverticulosis of large intestine without perforation or abscess without bleeding: Secondary | ICD-10-CM | POA: Diagnosis present

## 2020-10-21 DIAGNOSIS — E119 Type 2 diabetes mellitus without complications: Secondary | ICD-10-CM

## 2020-10-21 DIAGNOSIS — I70221 Atherosclerosis of native arteries of extremities with rest pain, right leg: Secondary | ICD-10-CM | POA: Diagnosis not present

## 2020-10-21 DIAGNOSIS — I700 Atherosclerosis of aorta: Secondary | ICD-10-CM | POA: Diagnosis not present

## 2020-10-21 LAB — COMPREHENSIVE METABOLIC PANEL
ALT: 9 U/L (ref 0–44)
AST: 10 U/L — ABNORMAL LOW (ref 15–41)
Albumin: 2.8 g/dL — ABNORMAL LOW (ref 3.5–5.0)
Alkaline Phosphatase: 97 U/L (ref 38–126)
Anion gap: 6 (ref 5–15)
BUN: 13 mg/dL (ref 8–23)
CO2: 30 mmol/L (ref 22–32)
Calcium: 8.8 mg/dL — ABNORMAL LOW (ref 8.9–10.3)
Chloride: 102 mmol/L (ref 98–111)
Creatinine, Ser: 0.46 mg/dL — ABNORMAL LOW (ref 0.61–1.24)
GFR, Estimated: >60 mL/min (ref >60)
Glucose, Bld: 120 mg/dL — ABNORMAL HIGH (ref 70–99)
Potassium: 3.9 mmol/L (ref 3.5–5.1)
Sodium: 138 mmol/L (ref 135–145)
Total Bilirubin: 0.4 mg/dL (ref 0.3–1.2)
Total Protein: 6.6 g/dL (ref 6.5–8.1)

## 2020-10-21 LAB — CBC WITH DIFFERENTIAL/PLATELET
Abs Immature Granulocytes: 0.16 10*3/uL — ABNORMAL HIGH (ref 0.00–0.07)
Basophils Absolute: 0.1 10*3/uL (ref 0.0–0.1)
Basophils Relative: 1 %
Eosinophils Absolute: 0.1 10*3/uL (ref 0.0–0.5)
Eosinophils Relative: 1 %
HCT: 31 % — ABNORMAL LOW (ref 39.0–52.0)
Hemoglobin: 9.3 g/dL — ABNORMAL LOW (ref 13.0–17.0)
Immature Granulocytes: 1 %
Lymphocytes Relative: 8 %
Lymphs Abs: 1.3 10*3/uL (ref 0.7–4.0)
MCH: 26.3 pg (ref 26.0–34.0)
MCHC: 30 g/dL (ref 30.0–36.0)
MCV: 87.8 fL (ref 80.0–100.0)
Monocytes Absolute: 1.1 10*3/uL — ABNORMAL HIGH (ref 0.1–1.0)
Monocytes Relative: 7 %
Neutro Abs: 13.7 10*3/uL — ABNORMAL HIGH (ref 1.7–7.7)
Neutrophils Relative %: 82 %
Platelets: 660 10*3/uL — ABNORMAL HIGH (ref 150–400)
RBC: 3.53 MIL/uL — ABNORMAL LOW (ref 4.22–5.81)
RDW: 24.4 % — ABNORMAL HIGH (ref 11.5–15.5)
WBC: 16.5 10*3/uL — ABNORMAL HIGH (ref 4.0–10.5)
nRBC: 0 % (ref 0.0–0.2)

## 2020-10-21 LAB — CBG MONITORING, ED: Glucose-Capillary: 107 mg/dL — ABNORMAL HIGH (ref 70–99)

## 2020-10-21 IMAGING — DX DG KNEE 1-2V*R*
2 series · 2 of 2 positions shown · non-contrast
Comparison: [DATE] CT

CLINICAL DATA: Postop for below the knee amputation with
cellulitis.

EXAM:
RIGHT KNEE - 1-2 VIEW

[tibia ap]
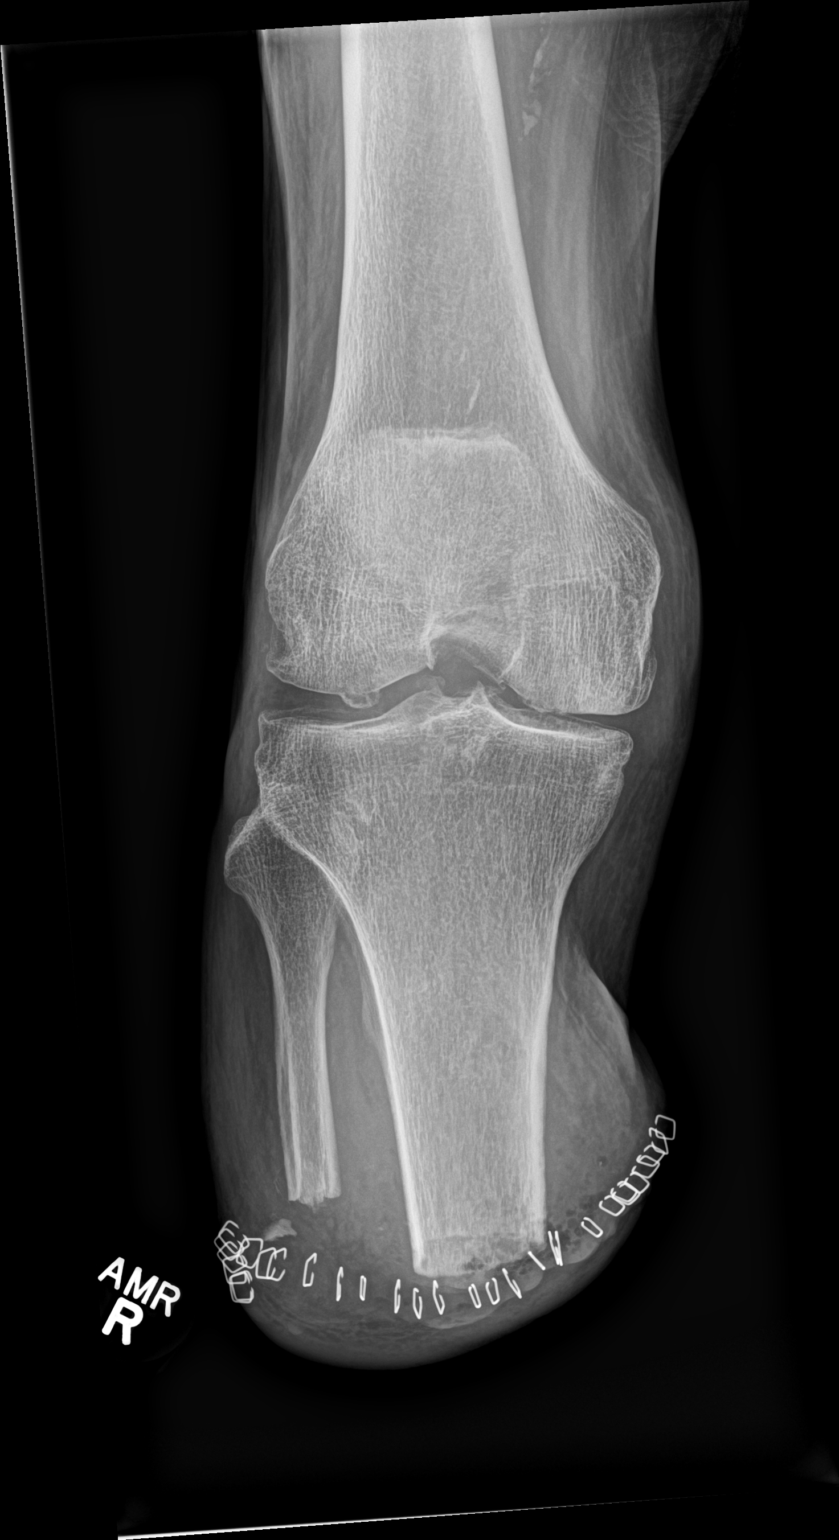

[tibia lat]
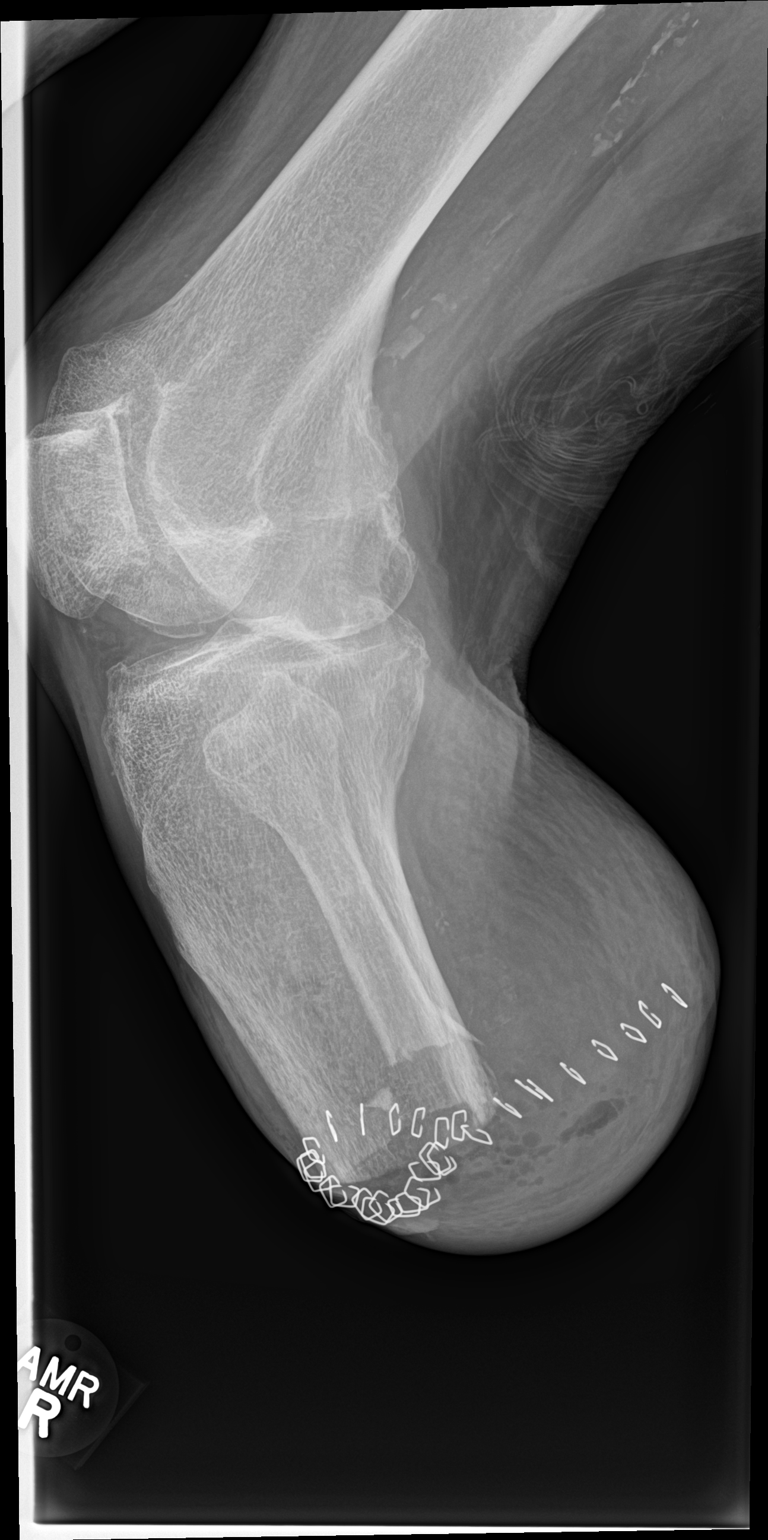

[2 of 2 positions shown; findings below may reference images not displayed]

FINDINGS: Interval below the knee amputation with surgical clips. Soft tissue
swelling and gas about the surgical site. No gross osseous
destruction. Bone fragment anterior and lateral to the distal
fibula. Degenerative changes of the mediolateral compartments of the
knee. Vascular calcifications.
IMPRESSION: Soft tissue swelling and gas about the surgical site, status post
below the knee a amputation. Findings suggest cellulitis and
possible necrotizing fasciitis. No gross osseous destruction to
confirm recurrent osteomyelitis.

## 2020-10-21 MED ORDER — OXYCODONE-ACETAMINOPHEN 10-325 MG PO TABS: 1.0000 | ORAL_TABLET | ORAL | Status: DC | PRN

## 2020-10-21 MED ORDER — PIPERACILLIN-TAZOBACTAM 3.375 G IVPB
3.3750 g | Freq: Three times a day (TID) | INTRAVENOUS | Status: DC
  Administered 2020-10-21 – 2020-10-24 (×8): 3.375 g via INTRAVENOUS
  Filled 2020-10-21 (×9): qty 50

## 2020-10-21 MED ORDER — LACTATED RINGERS IV SOLN: INTRAVENOUS | Status: DC

## 2020-10-21 MED ORDER — PANTOPRAZOLE SODIUM 40 MG PO TBEC
40.0000 mg | DELAYED_RELEASE_TABLET | Freq: Every day | ORAL | Status: DC
  Administered 2020-10-22 – 2020-11-01 (×10): 40 mg via ORAL
  Filled 2020-10-21 (×9): qty 1

## 2020-10-21 MED ORDER — ONDANSETRON HCL 4 MG/2ML IJ SOLN
4.0000 mg | Freq: Four times a day (QID) | INTRAMUSCULAR | Status: DC | PRN
Start: 2020-10-21 — End: 2020-11-02
  Administered 2020-10-26: 4 mg via INTRAVENOUS
  Filled 2020-10-21: qty 2

## 2020-10-21 MED ORDER — ONDANSETRON HCL 4 MG PO TABS: 4.0000 mg | ORAL_TABLET | Freq: Four times a day (QID) | ORAL | Status: DC | PRN

## 2020-10-21 MED ORDER — ATORVASTATIN CALCIUM 10 MG PO TABS
10.0000 mg | ORAL_TABLET | Freq: Every day | ORAL | Status: DC
  Administered 2020-10-22 – 2020-11-01 (×11): 10 mg via ORAL
  Filled 2020-10-21 (×11): qty 1

## 2020-10-21 MED ORDER — OXYCODONE HCL ER 10 MG PO T12A
10.0000 mg | EXTENDED_RELEASE_TABLET | Freq: Two times a day (BID) | ORAL | Status: DC
  Administered 2020-10-21 – 2020-11-01 (×22): 10 mg via ORAL
  Filled 2020-10-21 (×22): qty 1

## 2020-10-21 MED ORDER — VANCOMYCIN HCL 1250 MG/250ML IV SOLN
1250.0000 mg | INTRAVENOUS | Status: DC
  Administered 2020-10-22 – 2020-10-25 (×4): 1250 mg via INTRAVENOUS
  Filled 2020-10-21 (×6): qty 250

## 2020-10-21 MED ORDER — HEPARIN SODIUM (PORCINE) 5000 UNIT/ML IJ SOLN
5000.0000 [IU] | Freq: Three times a day (TID) | INTRAMUSCULAR | Status: DC
  Administered 2020-10-21 – 2020-10-25 (×12): 5000 [IU] via SUBCUTANEOUS
  Filled 2020-10-21 (×8): qty 1

## 2020-10-21 MED ORDER — INSULIN ASPART 100 UNIT/ML IJ SOLN: 0.0000 [IU] | Freq: Every day | INTRAMUSCULAR | Status: DC

## 2020-10-21 MED ORDER — INSULIN DETEMIR 100 UNIT/ML ~~LOC~~ SOLN
10.0000 [IU] | Freq: Every day | SUBCUTANEOUS | Status: DC
  Administered 2020-10-21: 10 [IU] via SUBCUTANEOUS
  Filled 2020-10-21 (×2): qty 0.1

## 2020-10-21 MED ORDER — VANCOMYCIN HCL IN DEXTROSE 1-5 GM/200ML-% IV SOLN
1000.0000 mg | Freq: Once | INTRAVENOUS | Status: AC
  Administered 2020-10-21: 1000 mg via INTRAVENOUS
  Filled 2020-10-21: qty 200

## 2020-10-21 MED ORDER — FERROUS SULFATE 325 (65 FE) MG PO TABS
324.0000 mg | ORAL_TABLET | Freq: Every day | ORAL | Status: DC
  Administered 2020-10-22 – 2020-11-01 (×9): 324 mg via ORAL
  Filled 2020-10-21 (×9): qty 1

## 2020-10-21 MED ORDER — INSULIN ASPART 100 UNIT/ML IJ SOLN
0.0000 [IU] | Freq: Three times a day (TID) | INTRAMUSCULAR | Status: DC
  Administered 2020-10-23 – 2020-10-24 (×3): 2 [IU] via SUBCUTANEOUS
  Administered 2020-10-24 – 2020-10-26 (×5): 1 [IU] via SUBCUTANEOUS
  Administered 2020-10-27: 2 [IU] via SUBCUTANEOUS
  Administered 2020-10-28: 1 [IU] via SUBCUTANEOUS
  Administered 2020-10-28 – 2020-10-29 (×3): 2 [IU] via SUBCUTANEOUS
  Administered 2020-10-30: 1 [IU] via SUBCUTANEOUS
  Administered 2020-10-31: 2 [IU] via SUBCUTANEOUS
  Administered 2020-10-31: 3 [IU] via SUBCUTANEOUS
  Administered 2020-10-31: 1 [IU] via SUBCUTANEOUS
  Administered 2020-11-01: 3 [IU] via SUBCUTANEOUS
  Administered 2020-11-01 (×2): 2 [IU] via SUBCUTANEOUS

## 2020-10-21 MED ORDER — OXYCODONE-ACETAMINOPHEN 5-325 MG PO TABS
2.0000 | ORAL_TABLET | ORAL | Status: DC | PRN
  Administered 2020-10-23 – 2020-10-31 (×11): 2 via ORAL
  Filled 2020-10-21 (×11): qty 2

## 2020-10-21 MED ORDER — METHOCARBAMOL 500 MG PO TABS
500.0000 mg | ORAL_TABLET | Freq: Four times a day (QID) | ORAL | Status: DC | PRN
  Administered 2020-10-25 – 2020-10-31 (×5): 500 mg via ORAL
  Filled 2020-10-21 (×5): qty 1

## 2020-10-21 MED ORDER — LEVETIRACETAM 500 MG PO TABS
500.0000 mg | ORAL_TABLET | Freq: Two times a day (BID) | ORAL | Status: DC
  Administered 2020-10-21 – 2020-11-01 (×23): 500 mg via ORAL
  Filled 2020-10-21 (×23): qty 1

## 2020-10-21 MED ORDER — CLOPIDOGREL BISULFATE 75 MG PO TABS
75.0000 mg | ORAL_TABLET | Freq: Every day | ORAL | Status: DC
Start: 2020-10-21 — End: 2020-10-22

## 2020-10-21 MED ORDER — PIPERACILLIN-TAZOBACTAM 3.375 G IVPB 30 MIN
3.3750 g | Freq: Once | INTRAVENOUS | Status: AC
  Administered 2020-10-21: 3.375 g via INTRAVENOUS
  Filled 2020-10-21: qty 50

## 2020-10-21 NOTE — ED Provider Notes (Signed)
[facility] Provider Note   CSN: [id number] Arrival date & time:        History Chief Complaint  Patient presents with   Leg Injury    Infected wound     Thomas Garcia is a 80 y.o. male.  HPI  This patient is a 81 year old male who is a diabetic has a history of lung cancer and has a history of bilateral below the knee amputations.  Most recently the patient had a right-sided below the knee amputation that was performed by Dr. Carlis Abbott on September 28, 2020.  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  During the course of the admission to the hospital the patient had had asymptomatic COVID-19, he had 3 units of packed red blood cells that were given for symptomatic anemia, he had an ischemic right foot which required the amputation done by Dr. Carlis Abbott.  He now has home health and lives locally.  And the home health nurse went to his house this morning to check on his wounds and change his dressings they noted a purulent foul-smelling tapioca like discharge coming from the stapled wound, it was recommended that he come to the hospital.  The patient denies fevers chills nausea vomiting, he does have some pain in that area but it is not out of proportion.  Symptoms are persistent, gradually worsening, he has not been seen by Dr. Carlis Abbott recently according to his report  Past Medical History:  Diagnosis Date   Diabetes mellitus without complication ([place])    Kidney stones    Lung cancer ([place]) 2022   PONV (postoperative nausea and vomiting)     Patient Active Problem List   Diagnosis Date Noted   Heme + stool    Right foot ulcer ([place])    Acute on chronic anemia 09/22/2020    Acute anemia 09/22/2020   Ulcer of leg, chronic, right (HCC)    Malignant neoplasm of upper lobe of right lung ([place]) 08/04/2020   Brain metastasis ([place])    Right-sided nontraumatic intracerebral hemorrhage (HCC)    Pulmonary embolism ([place]) 06/27/2020   Brain mass 06/26/2020   Hyponatremia 06/26/2020   Microcytic anemia 06/26/2020   Cavitating mass in right upper lung lobe 05/27/2020   Colon cancer screening 06/22/2019   Abnormality of gait 11/06/2018   Diabetes mellitus type 2 in nonobese [facility])    Phantom limb pain (HCC)    Postoperative pain    Unilateral complete BKA, left, sequela (HCC)    Acute blood loss anemia    Essential hypertension    Neuropathic pain    Unilateral complete BKA, left, initial encounter (HCC)    Tobacco abuse    Benign essential HTN    Diabetic peripheral neuropathy (HCC)    Post-operative pain    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HCC)    PVD (peripheral vascular disease) ([place])    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 ([place]) 09/08/2018   Osteomyelitis of second toe of left foot ([place]) 09/08/2018   Cellulitis of left foot 09/08/2018   Leukocytosis 09/08/2018   Fever and chills 09/08/2018   MOLE 10/26/2008   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 ([place]) 10/26/2008  OVERWEIGHT 10/26/2008   NICOTINE ADDICTION 10/26/2008   ACUTE CYSTITIS 10/26/2008   FATIGUE 10/26/2008   COUGH 10/26/2008   ELECTROCARDIOGRAM, ABNORMAL 10/26/2008    Past Surgical History:  Procedure Laterality Date   ABDOMINAL AORTOGRAM W/LOWER EXTREMITY N/A 09/17/2018   Procedure: ABDOMINAL AORTOGRAM W/LOWER EXTREMITY;  Surgeon: Marty Heck, MD;  Location: [facility];  Service: Cardiovascular;  Laterality: N/A;   AMPUTATION Left 09/22/2018   Procedure: AMPUTATION BELOW KNEE LEFT;  Surgeon: Marty Heck, MD;  Location: [facility];  Service: Vascular;  Laterality: Left;   AMPUTATION Right 09/28/2020   Procedure: RIGHT BELOW KNEE AMPUTATION;  Surgeon: Marty Heck, MD;  Location: [facility];  Service: Vascular;  Laterality: Right;   AMPUTATION TOE Left 09/10/2018   Procedure: AMPUTATION OF THIRD TOE LEFT FOOT, DEBRIDEMENT OF ULCERS ON SECOND TOE AND PARTIAL AMPUTATION SECOND TOE ON LEFT FOOT;  Surgeon: Virl Cagey, MD;  Location: [facility];  Service: General;  Laterality: Left;   APPLICATION OF CRANIAL NAVIGATION N/A 07/08/2020   Procedure: APPLICATION OF CRANIAL NAVIGATION;  Surgeon: Karsten Ro, DO;  Location: [facility];  Service: Neurosurgery;  Laterality: N/A;   APPLICATION OF WOUND VAC Left 09/18/2018   Procedure: APPLICATION OF WOUND VAC;  Surgeon: Marty Heck, MD;  Location: [facility];  Service: Vascular;  Laterality: Left;   CRANIOTOMY Right 07/08/2020   Procedure: Right Occipital craniotomy for tumor resection with brainlab;  Surgeon: Dawley, Theodoro Doing, DO;  Location: [facility];  Service: Neurosurgery;  Laterality: Right;   CYSTOSCOPY  05/08/2012   Procedure: CYSTOSCOPY;  Surgeon: Marissa Nestle, MD;  Location: [facility];  Service: Urology;  Laterality: N/A;  Evacuation of clots   IR IVC FILTER PLMT / S&I /IMG GUID/MOD SED  07/05/2020   KIDNEY SURGERY     sutures    PERIPHERAL VASCULAR BALLOON ANGIOPLASTY  09/17/2018   Procedure: PERIPHERAL VASCULAR BALLOON ANGIOPLASTY;  Surgeon: Marty Heck, MD;  Location: [facility];  Service: Cardiovascular;;  LT SFA and AT   RADIOLOGY WITH ANESTHESIA N/A 06/30/2020   Procedure: MRI WITH ANESTHESIA   BRAIN WITH AND WITHOUT CONTRAST;  Surgeon: Radiologist, Medication, MD;  Location: [facility];  Service: Radiology;  Laterality: N/A;   SPLENECTOMY     TRANSMETATARSAL AMPUTATION Left 09/18/2018   Procedure: TRANSMETATARSAL AMPUTATION LEFT FOOT;  Surgeon: Marty Heck, MD;  Location: [facility];  Service: Vascular;  Laterality: Left;   TRANSURETHRAL RESECTION OF PROSTATE  05/14/2012   Procedure: TRANSURETHRAL RESECTION OF THE PROSTATE (TURP);  Surgeon: Marissa Nestle, MD;  Location: [facility];   Service: Urology;  Laterality: N/A;       Family History  Problem Relation Age of Onset   Diabetes Mother     Social History   Tobacco Use   Smoking status: Former    Packs/day: 0.50    Years: 45.00    Pack years: 22.50    Types: Cigarettes   Smokeless tobacco: Never   Tobacco comments:    4 cigarettes per day  Vaping Use   Vaping Use: Never used  Substance Use Topics   Alcohol use: No   Drug use: No    Home Medications Prior to Admission medications   Medication Sig Start Date End Date Taking? Authorizing Provider  acetaminophen (TYLENOL) 325 MG tablet Take 2 tablets (650 mg total) by mouth every 4 (four) hours as needed for headache or mild pain. Patient taking differently: Take 650 mg by mouth  daily. 09/29/18   Angiulli, Lavon Paganini, PA-C  atorvastatin (LIPITOR) 10 MG tablet Take 1 tablet (10 mg total) by mouth daily at 6 PM. 04/14/20   Gosrani, Nimish C, MD  Cal Carb-Mag Hydrox-Simeth (ROLAIDS ADVANCED) 1000-200-40 MG CHEW Chew 1 tablet by mouth daily as needed (for indigestion).    [provider]  clopidogrel (PLAVIX) 75 MG tablet Take 1 tablet (75 mg total) by mouth daily. 07/23/20   Lavina Hamman, MD  ferrous sulfate 324 MG TBEC Take 324 mg by mouth daily with breakfast.    [provider]  levETIRAcetam (KEPPRA) 500 MG tablet Take 1 tablet (500 mg total) by mouth 2 (two) times daily. 10/03/20 01/01/21  [place] Eric J, DO  loperamide (IMODIUM) 2 MG capsule Take 1 capsule (2 mg total) by mouth as needed for diarrhea or loose stools. Patient not taking: Reported on 10/19/2020 09/21/20   Doree Albee, MD  metFORMIN (GLUCOPHAGE) 500 MG tablet Take 1 tablet (500 mg total) by mouth 2 (two) times daily with a meal. 10/03/20 01/01/21  [place] Donnamarie Poag, DO  methocarbamol (ROBAXIN) 500 MG tablet Take 1 tablet (500 mg total) by mouth every 6 (six) hours as needed for muscle spasms. 10/03/20 11/02/20  [place] Donnamarie Poag, DO  oxyCODONE (OXYCONTIN) 10 mg 12 hr tablet Take 1  tablet (10 mg total) by mouth every 12 (twelve) hours. 10/19/20   Doree Albee, MD  pantoprazole (PROTONIX) 40 MG tablet Take 1 tablet (40 mg total) by mouth daily at 6 (six) AM. 07/14/20   Lavina Hamman, MD  traZODone (DESYREL) 50 MG tablet Take 1 tablet (50 mg total) by mouth at bedtime. 04/14/20   Doree Albee, MD    Allergies    Patient has no known allergies.  Review of Systems   Review of Systems  All other systems reviewed and are negative.  Physical Exam Updated Vital Signs BP 123/63   Pulse 99   Temp 98.7 F (37.1 C) (Oral)   Resp 18   Ht 1.448 m (4\' 9" )   Wt 74.8 kg   SpO2 97%   BMI 35.71 kg/m   Physical Exam Vitals and nursing note reviewed.  Constitutional:      General: He is not in acute distress.    Appearance: He is well-developed.  HENT:     Head: Normocephalic and atraumatic.     Mouth/Throat:     Pharynx: No oropharyngeal exudate.  Eyes:     General: No scleral icterus.       Right eye: No discharge.        Left eye: No discharge.     Conjunctiva/sclera: Conjunctivae normal.     Pupils: Pupils are equal, round, and reactive to light.  Neck:     Thyroid: No thyromegaly.     Vascular: No JVD.  Cardiovascular:     Rate and Rhythm: Normal rate and regular rhythm.     Heart sounds: Normal heart sounds. No murmur heard.   No friction rub. No gallop.  Pulmonary:     Effort: Pulmonary effort is normal. No respiratory distress.     Breath sounds: Normal breath sounds. No wheezing or rales.  Abdominal:     General: Bowel sounds are normal. There is no distension.     Palpations: Abdomen is soft. There is no mass.     Tenderness: There is no abdominal tenderness.  Musculoskeletal:        General: Tenderness present. Normal range of  motion.     Cervical back: Normal range of motion and neck supple.     Comments: The patient's lower extremities were evaluated in their completeness, the left lower extremity has a well-healed stump, the right lower  extremity has a sutured wound which appears to have some foul-smelling purulent material coming from it.  There is no significant surrounding redness erythema warmth or induration of the skin.  There is some tenderness at the distal stump on the right but a very supple knee joint.  Lymphadenopathy:     Cervical: No cervical adenopathy.  Skin:    General: Skin is warm and dry.     Findings: No erythema or rash.  Neurological:     Mental Status: He is alert.     Coordination: Coordination normal.  Psychiatric:        Behavior: Behavior normal.       ED Results / Procedures / Treatments   Labs (all labs ordered are listed, but only abnormal results are displayed) Labs Reviewed  CBC WITH DIFFERENTIAL/PLATELET - Abnormal; Notable for the following components:      Result Value   WBC 16.5 (*)    RBC 3.53 (*)    Hemoglobin 9.3 (*)    HCT 31.0 (*)    RDW 24.4 (*)    Platelets 660 (*)    Neutro Abs 13.7 (*)    Monocytes Absolute 1.1 (*)    Abs Immature Granulocytes 0.16 (*)    All other components within normal limits  COMPREHENSIVE METABOLIC PANEL - Abnormal; Notable for the following components:   Glucose, Bld 120 (*)    Creatinine, Ser 0.46 (*)    Calcium 8.8 (*)    Albumin 2.8 (*)    AST 10 (*)    All other components within normal limits  CULTURE, BLOOD (ROUTINE X 2)  CULTURE, BLOOD (ROUTINE X 2)    EKG None  Radiology DG Knee 1-2 Views Right  Result Date: 10/21/2020 CLINICAL DATA:  Postop for below the knee amputation with cellulitis. EXAM: RIGHT KNEE - 1-2 VIEW COMPARISON:  09/22/2020 CT FINDINGS: Interval below the knee amputation with surgical clips. Soft tissue swelling and gas about the surgical site. No gross osseous destruction. Bone fragment anterior and lateral to the distal fibula. Degenerative changes of the mediolateral compartments of the knee. Vascular calcifications. IMPRESSION: Soft tissue swelling and gas about the surgical site, status post below the  knee a amputation. Findings suggest cellulitis and possible necrotizing fasciitis. No gross osseous destruction to confirm recurrent osteomyelitis. Electronically Signed   By: Abigail Miyamoto M.D.   On: 10/21/2020 13:28    Procedures Procedures   Medications Ordered in ED Medications  vancomycin (VANCOCIN) IVPB 1000 mg/200 mL premix (0 mg Intravenous Stopped 10/21/20 1500)  piperacillin-tazobactam (ZOSYN) IVPB 3.375 g (0 g Intravenous Stopped 10/21/20 1508)    ED Course  I have reviewed the triage vital signs and the nursing notes.  Pertinent labs & imaging results that were available during my care of the patient were reviewed by me and considered in my medical decision making (see chart for details).  Clinical Course as of 10/21/20 1514  Fri Oct 21, 2020  1513 I discussed case with Dr. Carlis Abbott who is seen in the x-rays, the blood work and the picture on the chart and agrees that this patient should be transferred to [facility] where he can be evaluated by vascular surgery, discussed with Dr. Sheran Lawless of the hospitalist service who will admit and  facilitate transfer to [facility].  Antibiotics have been given. [BM]    Clinical Course User Index [BM] Noemi Chapel, MD   MDM Rules/Calculators/A&P                          Patient is awake alert he is not tachycardic, he has signs of infection in the right stump.  We will get an x-ray labs and discussed with vascular surgery.  Antibiotics have been ordered, the patient is agreeable  Final Clinical Impression(s) / ED Diagnoses Final diagnoses:  Wound infection    Rx / DC Orders ED Discharge Orders     None        Noemi Chapel, MD 10/21/20 1514

## 2020-10-21 NOTE — Progress Notes (Signed)
Pharmacy Antibiotic Note  Thomas Garcia is a 80 y.o. male admitted on 10/21/2020 with wound infection.  Pharmacy has been consulted for Vancomycin and Zosyn dosing. (Pt received initial doses in ED)  Plan: Zosyn 3.375g IV q8h (4 hour infusion). Vancomycin 1250mg  IV q24hrs  Height: 4\' 9"  (144.8 cm) Weight: 74.8 kg (165 lb) IBW/kg (Calculated) : 43.1  Temp (24hrs), Avg:98.7 F (37.1 C), Min:98.7 F (37.1 C), Max:98.7 F (37.1 C)  Recent Labs  Lab 10/21/20 1237  WBC 16.5*  CREATININE 0.46*    Estimated Creatinine Clearance: 60.1 mL/min (A) (by C-G formula based on SCr of 0.46 mg/dL (L)).    No Known Allergies  Antimicrobials this admission: 7/15 Vancomycin >>  7/15 Zosyn >>   Dose adjustments this admission:   Microbiology results:  BCx: pending  UCx: pending   Sputum: pending   MRSA PCR: pending  Thank you for allowing pharmacy to be a part of this patient's care.  Hart Robinsons A 10/21/2020 4:42 PM

## 2020-10-21 NOTE — H&P (Signed)
History and Physical  Thomas Garcia ZYY:[id number] DOB: 1941/02/06 DOA: 10/21/2020  Referring physician: Dr. Sabra Heck, ED physician PCP: Doree Albee, MD  Outpatient Specialists:   Patient Coming From: Home  Chief Complaint: Right leg stump infection  HPI: Thomas Garcia is a 80 y.o. 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  The last BKA was done on 09/28/2020 by Dr. Carlis Abbott.  He was recovering well until about 2 weeks ago when he noticed some drainage from the incision site.  He has had continued drainage that has worsened over the past couple weeks.  No palliating or provoking factors.  He does have some pain.  He denies fevers, chills, nausea, vomiting.  His appetite has been mildly diminished.  He denies chest pain, shortness of breath.  Emergency Department Course: White count was 16,000.  EDP discussed patient with vascular surgeon, who recommends transfer to [facility].  Antibiotics started.  Review of Systems:  Pt denies any fevers, chills, nausea, vomiting, diarrhea, constipation, abdominal pain, shortness of breath, dyspnea on exertion, orthopnea, cough, wheezing, palpitations, headache, vision changes, lightheadedness, dizziness, melena, rectal bleeding.  Review of systems are otherwise negative  Past Medical History:  Diagnosis Date   Diabetes mellitus without complication ([place])    Kidney stones    Lung cancer ([place]) 2022   PONV (postoperative nausea and vomiting)    Past Surgical History:  Procedure Laterality Date   ABDOMINAL AORTOGRAM W/LOWER EXTREMITY N/A 09/17/2018   Procedure: ABDOMINAL AORTOGRAM W/LOWER EXTREMITY;  Surgeon: Marty Heck, MD;  Location: [place] [facility];  Service: Cardiovascular;  Laterality: N/A;   AMPUTATION Left 09/22/2018   Procedure: AMPUTATION BELOW KNEE LEFT;  Surgeon: Marty Heck, MD;  Location: [place];  Service: Vascular;  Laterality: Left;   AMPUTATION Right 09/28/2020   Procedure: RIGHT BELOW KNEE AMPUTATION;  Surgeon: Marty Heck, MD;  Location: [place];  Service: Vascular;  Laterality: Right;   AMPUTATION TOE Left 09/10/2018   Procedure: AMPUTATION OF THIRD TOE LEFT FOOT, DEBRIDEMENT OF ULCERS ON SECOND TOE AND PARTIAL AMPUTATION SECOND TOE ON LEFT FOOT;  Surgeon: Virl Cagey, MD;  Location: [facility];  Service: General;  Laterality: Left;   APPLICATION OF CRANIAL NAVIGATION N/A 07/08/2020   Procedure: APPLICATION OF CRANIAL NAVIGATION;  Surgeon: Karsten Ro, DO;  Location: [place];  Service: Neurosurgery;  Laterality: N/A;   APPLICATION OF WOUND VAC Left 09/18/2018   Procedure: APPLICATION OF WOUND VAC;  Surgeon: Marty Heck, MD;  Location: [place];  Service: Vascular;  Laterality: Left;   CRANIOTOMY Right 07/08/2020   Procedure: Right Occipital craniotomy for tumor resection with brainlab;  Surgeon: Dawley, Theodoro Doing, DO;  Location: [place];  Service: Neurosurgery;  Laterality: Right;   CYSTOSCOPY  05/08/2012   Procedure: CYSTOSCOPY;  Surgeon: Marissa Nestle, MD;  Location: [facility];  Service: Urology;  Laterality: N/A;  Evacuation of clots   IR IVC FILTER PLMT / S&I /IMG GUID/MOD SED  07/05/2020   KIDNEY SURGERY     sutures    PERIPHERAL VASCULAR BALLOON ANGIOPLASTY  09/17/2018   Procedure: PERIPHERAL VASCULAR BALLOON ANGIOPLASTY;  Surgeon: Marty Heck, MD;  Location: [place] [facility];  Service: Cardiovascular;;  LT SFA and AT   RADIOLOGY WITH ANESTHESIA N/A 06/30/2020   Procedure: MRI WITH ANESTHESIA   BRAIN WITH  AND WITHOUT CONTRAST;  Surgeon: Radiologist, Medication, MD;  Location: [place];  Service: Radiology;  Laterality: N/A;   SPLENECTOMY     TRANSMETATARSAL AMPUTATION Left 09/18/2018   Procedure: TRANSMETATARSAL AMPUTATION LEFT FOOT;  Surgeon: Marty Heck, MD;  Location: [place];  Service: Vascular;  Laterality: Left;    TRANSURETHRAL RESECTION OF PROSTATE  05/14/2012   Procedure: TRANSURETHRAL RESECTION OF THE PROSTATE (TURP);  Surgeon: Marissa Nestle, MD;  Location: [facility];  Service: Urology;  Laterality: N/A;   Social History:  reports that he has quit smoking. His smoking use included cigarettes. He has a 22.50 pack-year smoking history. He has never used smokeless tobacco. He reports that he does not drink alcohol and does not use drugs. Patient lives at home  No Known Allergies  Family History  Problem Relation Age of Onset   Diabetes Mother       Prior to Admission medications   Medication Sig Start Date End Date Taking? Authorizing Provider  acetaminophen (TYLENOL) 325 MG tablet Take 2 tablets (650 mg total) by mouth every 4 (four) hours as needed for headache or mild pain. Patient taking differently: Take 650 mg by mouth daily. 09/29/18  Yes Angiulli, Lavon Paganini, PA-C  Cal Carb-Mag Hydrox-Simeth (ROLAIDS ADVANCED) 1000-200-40 MG CHEW Chew 1 tablet by mouth daily as needed (for indigestion).   Yes [provider]  clopidogrel (PLAVIX) 75 MG tablet Take 1 tablet (75 mg total) by mouth daily. 07/23/20  Yes Lavina Hamman, MD  ferrous sulfate 324 MG TBEC Take 324 mg by mouth daily with breakfast.   Yes [provider]  levETIRAcetam (KEPPRA) 500 MG tablet Take 1 tablet (500 mg total) by mouth 2 (two) times daily. 10/03/20 01/01/21 Yes [place] Eric J, DO  metFORMIN (GLUCOPHAGE) 500 MG tablet Take 1 tablet (500 mg total) by mouth 2 (two) times daily with a meal. 10/03/20 01/01/21 Yes [place] Eric J, DO  methocarbamol (ROBAXIN) 500 MG tablet Take 1 tablet (500 mg total) by mouth every 6 (six) hours as needed for muscle spasms. 10/03/20 11/02/20 Yes [place] Donnamarie Poag, DO  NARCAN 4 MG/0.1ML LIQD nasal spray kit SMARTSIG:Spray(s) Both Nares 10/06/20  Yes [provider]  oxyCODONE (OXYCONTIN) 10 mg 12 hr tablet Take 1 tablet (10 mg total) by mouth every 12 (twelve) hours. 10/19/20  Yes  Gosrani, Nimish C, MD  oxyCODONE-acetaminophen (PERCOCET) 10-325 MG tablet Take 1 tablet by mouth every 4 (four) hours as needed. 10/06/20  Yes [provider]  pantoprazole (PROTONIX) 40 MG tablet Take 1 tablet (40 mg total) by mouth daily at 6 (six) AM. 07/14/20  Yes Lavina Hamman, MD  atorvastatin (LIPITOR) 10 MG tablet Take 1 tablet (10 mg total) by mouth daily at 6 PM. 04/14/20   Anastasio Champion, Nimish C, MD  loperamide (IMODIUM) 2 MG capsule Take 1 capsule (2 mg total) by mouth as needed for diarrhea or loose stools. Patient not taking: Reported on 10/19/2020 09/21/20   Doree Albee, MD  traZODone (DESYREL) 50 MG tablet Take 1 tablet (50 mg total) by mouth at bedtime. Patient not taking: Reported on 10/21/2020 04/14/20   Doree Albee, MD    Physical Exam: BP 123/63   Pulse 99   Temp 98.7 F (37.1 C) (Oral)   Resp 18   Ht _0  (1.448 m)   Wt 74.8 kg   SpO2 97%   BMI 35.71 kg/m   General: Elderly male. Awake and alert and oriented x3. No acute  cardiopulmonary distress.  HEENT: Normocephalic atraumatic.  Right and left ears normal in appearance.  Pupils equal, round, reactive to light. Extraocular muscles are intact. Sclerae anicteric and noninjected.  Moist mucosal membranes. No mucosal lesions.  Neck: Neck supple without lymphadenopathy. No carotid bruits. No masses palpated.  Cardiovascular: Regular rate with normal S1-S2 sounds. No murmurs, rubs, gallops auscultated. No JVD.  Respiratory: Good respiratory effort with no wheezes, rales, rhonchi. Lungs clear to auscultation bilaterally.  No accessory muscle use. Abdomen: Soft, nontender, nondistended. Active bowel sounds. No masses or hepatosplenomegaly  Skin: Bilateral BKA's.  The right stump has an incision with staples still in.  There is a 2 x 4 area of apparent necrosis with purulent drainage from the incision site. Musculoskeletal: No calf or leg pain. All major joints not erythematous nontender.  No upper deformation.   Good ROM.  No contractures  Psychiatric: Intact judgment and insight. Pleasant and cooperative. Neurologic: No focal neurological deficits. Strength is 5/5 and symmetric in upper and lower extremities.  Cranial nerves II through XII are grossly intact.           Labs on Admission: I have personally reviewed following labs and imaging studies  CBC: Recent Labs  Lab 10/21/20 1237  WBC 16.5*  NEUTROABS 13.7*  HGB 9.3*  HCT 31.0*  MCV 87.8  PLT 962*   Basic Metabolic Panel: Recent Labs  Lab 10/21/20 1237  NA 138  K 3.9  CL 102  CO2 30  GLUCOSE 120*  BUN 13  CREATININE 0.46*  CALCIUM 8.8*   GFR: Estimated Creatinine Clearance: 60.1 mL/min (A) (by C-G formula based on SCr of 0.46 mg/dL (L)). Liver Function Tests: Recent Labs  Lab 10/21/20 1237  AST 10*  ALT 9  ALKPHOS 97  BILITOT 0.4  PROT 6.6  ALBUMIN 2.8*   No results for input(s): LIPASE, AMYLASE in the last 168 hours. No results for input(s): AMMONIA in the last 168 hours. Coagulation Profile: No results for input(s): INR, PROTIME in the last 168 hours. Cardiac Enzymes: No results for input(s): CKTOTAL, CKMB, CKMBINDEX, TROPONINI in the last 168 hours. BNP (last 3 results) No results for input(s): PROBNP in the last 8760 hours. HbA1C: No results for input(s): HGBA1C in the last 72 hours. CBG: No results for input(s): GLUCAP in the last 168 hours. Lipid Profile: No results for input(s): CHOL, HDL, LDLCALC, TRIG, CHOLHDL, LDLDIRECT in the last 72 hours. Thyroid Function Tests: No results for input(s): TSH, T4TOTAL, FREET4, T3FREE, THYROIDAB in the last 72 hours. Anemia Panel: No results for input(s): VITAMINB12, FOLATE, FERRITIN, TIBC, IRON, RETICCTPCT in the last 72 hours. Urine analysis:    Component Value Date/Time   COLORURINE YELLOW 06/27/2020 0615   APPEARANCEUR CLEAR 06/27/2020 0615   LABSPEC 1.016 06/27/2020 0615   PHURINE 5.0 06/27/2020 0615   GLUCOSEU 50 (A) 06/27/2020 0615   HGBUR  NEGATIVE 06/27/2020 0615   HGBUR trace-intact 10/26/2008 0000   BILIRUBINUR NEGATIVE 06/27/2020 0615   KETONESUR 5 (A) 06/27/2020 0615   PROTEINUR 100 (A) 06/27/2020 0615   UROBILINOGEN 0.2 04/27/2012 1142   NITRITE NEGATIVE 06/27/2020 0615   LEUKOCYTESUR NEGATIVE 06/27/2020 0615   Sepsis Labs: _0 (procalcitonin:4,lacticidven:4) ) Recent Results (from the past 240 hour(s))  Blood culture (routine x 2)     Status: None (Preliminary result)   Collection Time: 10/21/20 12:38 PM   Specimen: Left Antecubital; Blood  Result Value Ref Range Status   Specimen Description   Final    LEFT ANTECUBITAL BOTTLES DRAWN  AEROBIC AND ANAEROBIC   Special Requests   Final    Blood Culture adequate volume Performed at [facility], [address], [place], [place] [address]    Culture PENDING  Incomplete   Report Status PENDING  Incomplete  Blood culture (routine x 2)     Status: None (Preliminary result)   Collection Time: 10/21/20  1:52 PM   Specimen: Right Antecubital; Blood  Result Value Ref Range Status   Specimen Description   Final    RIGHT ANTECUBITAL BOTTLES DRAWN AEROBIC AND ANAEROBIC   Special Requests   Final    Blood Culture adequate volume Performed at [facility], [address], [place],  [address]    Culture PENDING  Incomplete   Report Status PENDING  Incomplete     Radiological Exams on Admission: DG Knee 1-2 Views Right  Result Date: 10/21/2020 CLINICAL DATA:  Postop for below the knee amputation with cellulitis. EXAM: RIGHT KNEE - 1-2 VIEW COMPARISON:  09/22/2020 CT FINDINGS: Interval below the knee amputation with surgical clips. Soft tissue swelling and gas about the surgical site. No gross osseous destruction. Bone fragment anterior and lateral to the distal fibula. Degenerative changes of the mediolateral compartments of the knee. Vascular calcifications. IMPRESSION: Soft tissue swelling and gas about the surgical site, status post below the knee a  amputation. Findings suggest cellulitis and possible necrotizing fasciitis. No gross osseous destruction to confirm recurrent osteomyelitis. Electronically Signed   By: Abigail Miyamoto M.D.   On: 10/21/2020 13:28     Assessment/Plan: Principal Problem:   Wound infection after surgery Active Problems:   Benign essential HTN   Essential hypertension   Diabetes mellitus type 2 in nonobese [facility])   History of pulmonary embolism   Malignant neoplasm of upper lobe of right lung [facility])    This patient was discussed with the ED physician, including pertinent vitals, physical exam findings, labs, and imaging.  We also discussed care given by the ED provider.  Wound infection after surgery Admit to [facility] to consult on patient Continue vancomycin and Zosyn per pharmacy dosing Check CBC, bMP in the morning  Diabetes Will hold metformin Levemir 10 units daily Sliding scale insulin with CBGs before meals and nightly Hypertension Continue antihypertensives History of pulmonary embolism From the records it appears that patient had a CT showing age indeterminant small peripheral PEs.  Patient declined treatment at that time neoplasm of right lung with metastatic disease No respiratory problems at the moment  DVT prophylaxis: As the patient has a history of PE and current neoplasm, will have the patient on heparin.  This will need to be stopped 8 hours prior to surgery if that is deemed the appropriate treatment. Consultants: Vascular surgery Code Status: DNR Family Communication: none  Disposition Plan: pending   Truett Mainland, DO

## 2020-10-21 NOTE — ED Triage Notes (Signed)
Arrived by EMS from home, right BKA 3-4 months ago, [facility] nurse called EMS due to infection

## 2020-10-21 NOTE — Telephone Encounter (Signed)
Pamala Hurry from [place] calls to report that patient has saturated an ABD and is draining copious amount of malodorous fluid from stump. Site is covered in slough tissue. He is a double amputee and has transportation issues. Advised to go to ED/urgent care for further evaluation.

## 2020-10-21 NOTE — ED Notes (Signed)
One set of blood cultures and rainbow labs collected from [facility]

## 2020-10-22 DIAGNOSIS — T8789 Other complications of amputation stump: Secondary | ICD-10-CM

## 2020-10-22 DIAGNOSIS — T8189XA Other complications of procedures, not elsewhere classified, initial encounter: Secondary | ICD-10-CM

## 2020-10-22 DIAGNOSIS — C3490 Malignant neoplasm of unspecified part of unspecified bronchus or lung: Secondary | ICD-10-CM

## 2020-10-22 DIAGNOSIS — T8149XA Infection following a procedure, other surgical site, initial encounter: Secondary | ICD-10-CM

## 2020-10-22 DIAGNOSIS — C7931 Secondary malignant neoplasm of brain: Secondary | ICD-10-CM

## 2020-10-22 DIAGNOSIS — L0889 Other specified local infections of the skin and subcutaneous tissue: Secondary | ICD-10-CM

## 2020-10-22 DIAGNOSIS — Z87891 Personal history of nicotine dependence: Secondary | ICD-10-CM

## 2020-10-22 LAB — BASIC METABOLIC PANEL WITH GFR
Anion gap: 6 (ref 5–15)
BUN: 11 mg/dL (ref 8–23)
CO2: 29 mmol/L (ref 22–32)
Calcium: 8.8 mg/dL — ABNORMAL LOW (ref 8.9–10.3)
Chloride: 102 mmol/L (ref 98–111)
Creatinine, Ser: 0.52 mg/dL — ABNORMAL LOW (ref 0.61–1.24)
GFR, Estimated: >60 mL/min
Glucose, Bld: 76 mg/dL (ref 70–99)
Potassium: 4 mmol/L (ref 3.5–5.1)
Sodium: 137 mmol/L (ref 135–145)

## 2020-10-22 LAB — CBC
HCT: 29.5 % — ABNORMAL LOW (ref 39.0–52.0)
Hemoglobin: 8.8 g/dL — ABNORMAL LOW (ref 13.0–17.0)
MCH: 26 pg (ref 26.0–34.0)
MCHC: 29.8 g/dL — ABNORMAL LOW (ref 30.0–36.0)
MCV: 87.3 fL (ref 80.0–100.0)
Platelets: 592 10*3/uL — ABNORMAL HIGH (ref 150–400)
RBC: 3.38 MIL/uL — ABNORMAL LOW (ref 4.22–5.81)
RDW: 24.3 % — ABNORMAL HIGH (ref 11.5–15.5)
WBC: 17.8 10*3/uL — ABNORMAL HIGH (ref 4.0–10.5)
nRBC: 0 % (ref 0.0–0.2)

## 2020-10-22 LAB — GLUCOSE, CAPILLARY
Glucose-Capillary: 88 mg/dL (ref 70–99)
Glucose-Capillary: 78 mg/dL (ref 70–99)

## 2020-10-22 LAB — CBG MONITORING, ED
Glucose-Capillary: 60 mg/dL — ABNORMAL LOW (ref 70–99)
Glucose-Capillary: 97 mg/dL (ref 70–99)

## 2020-10-22 MED ORDER — ENSURE ENLIVE PO LIQD
237.0000 mL | Freq: Two times a day (BID) | ORAL | Status: DC
  Administered 2020-10-23 – 2020-10-28 (×6): 237 mL via ORAL

## 2020-10-22 MED ORDER — KETOROLAC TROMETHAMINE 30 MG/ML IJ SOLN
30.0000 mg | Freq: Four times a day (QID) | INTRAMUSCULAR | Status: AC | PRN
  Administered 2020-10-22 – 2020-10-25 (×4): 30 mg via INTRAVENOUS
  Filled 2020-10-22 (×4): qty 1

## 2020-10-22 MED ORDER — NICOTINE 7 MG/24HR TD PT24
7.0000 mg | MEDICATED_PATCH | Freq: Every day | TRANSDERMAL | Status: DC
  Administered 2020-10-22 – 2020-11-01 (×11): 7 mg via TRANSDERMAL
  Filled 2020-10-22 (×11): qty 1

## 2020-10-22 NOTE — Progress Notes (Signed)
Patient seen and examined. He is alert, denies pain.  Vitals Stable.  Continue with IV antibiotics, IV fluids.  Plan for [facility] tomorrow.  Vascular informed of patient arrival to [facility].  Hypoglycemia; hold levemir and continue SSI,   Niel Hummer, MD.

## 2020-10-22 NOTE — ED Notes (Addendum)
Dressing to BKA wound dressing changed. Purulent drainage noted. Telfa, gauze wrap, and ace applied. Patient requesting something for pain. Blood pressure 104/46. Dr Manuella Ghazi paged and informed that patient is requesting something for pain but blood pressures ar still low-order to be given.

## 2020-10-22 NOTE — Progress Notes (Signed)
PROGRESS NOTE    [name]  OZH:[id number] DOB: September 18, 1940 DOA: 10/21/2020 PCP: Doree Albee, MD   Brief Narrative:   ELDREDGE Garcia is a 80 y.o. 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  The last BKA was done on 09/28/2020 by Dr. Carlis Abbott.  He was recovering well until about 2 weeks ago when he noticed some drainage from the incision site.  Patient has been admitted with wound infection after recent surgery and will need transfer to [facility] for vascular consultation and likely conversion to [facility].  He has been started on vancomycin and Zosyn empirically.  Assessment & Plan:   Principal Problem:   Wound infection after surgery Active Problems:   Benign essential HTN   Essential hypertension   Diabetes mellitus type 2 in nonobese ([facility])   History of pulmonary embolism   Malignant neoplasm of upper lobe of right lung ([facility])   Wound infection after recent right BKA -Started on vancomycin and Zosyn empirically -Transfer to [facility] for further vascular evaluation and management  Type 2 diabetes-controlled -Holding home metformin -Levemir and SSI ordered -Carb modified diet  History of PE -Noted to have small peripheral PEs in the past and patient has declined anticoagulation previously  Neoplasm of the right lung with metastatic disease -Follow-up outpatient   DVT prophylaxis: Heparin Code Status: DNR Family Communication: None at bedside Disposition Plan:  Status is: Inpatient  Remains inpatient appropriate because:Ongoing diagnostic testing needed not appropriate for outpatient work up, IV treatments appropriate due to intensity of illness or inability to take PO, and Inpatient level of care appropriate due to severity of illness  Dispo: The patient is from: Home              Anticipated d/c is to: Home               Patient currently is not medically stable to d/c.   Difficult to place patient No   Skin Assessment:  I have examined the patient's skin and I agree with the wound assessment as performed by the wound care RN as outlined below:  Pressure Injury 09/23/20 Buttocks Right;Left;Mid Stage 1 -  Intact skin with non-blanchable redness of a localized area usually over a bony prominence. 8 X 6 CM redness to sacrum (Active)  09/23/20 0030  Location: Buttocks  Location Orientation: Right;Left;Mid  Staging: Stage 1 -  Intact skin with non-blanchable redness of a localized area usually over a bony prominence.  Wound Description (Comments): 8 X 6 CM redness to sacrum  Present on Admission: Yes    Consultants:  Vascular surgery  Procedures:  None  Antimicrobials:  Anti-infectives (From admission, onward)    Start     Dose/Rate Route Frequency Ordered Stop   10/22/20 0600  vancomycin (VANCOREADY) IVPB 1250 mg/250 mL        1,250 mg 166.7 mL/hr over 90 Minutes Intravenous Every 24 hours 10/21/20 1641     10/21/20 2200  piperacillin-tazobactam (ZOSYN) IVPB 3.375 g        3.375 g 12.5 mL/hr over 240 Minutes Intravenous Every 8 hours 10/21/20 1638     10/21/20 1245  vancomycin (VANCOCIN) IVPB 1000 mg/200 mL premix        1,000 mg 200 mL/hr over 60 Minutes Intravenous  Once 10/21/20 1237 10/21/20 1500   10/21/20 1245  piperacillin-tazobactam (ZOSYN)  IVPB 3.375 g        3.375 g 100 mL/hr over 30 Minutes Intravenous  Once 10/21/20 1237 10/21/20 1508       Subjective: Patient seen and evaluated today with no new acute complaints or concerns. No acute concerns or events noted overnight.  He denies any significant pain.  Objective: Vitals:   10/22/20 0100 10/22/20 0230 10/22/20 0530 10/22/20 0600  BP: 115/65 112/60 (!) 111/54 121/61  Pulse: 99 97 92   Resp: 17 16 16    Temp:      TempSrc:      SpO2: 96% 95% 94%   Weight:      Height:        Intake/Output Summary (Last 24 hours) at  10/22/2020 0745 Last data filed at 10/21/2020 1508 Gross per 24 hour  Intake 250 ml  Output --  Net 250 ml   Filed Weights   10/21/20 1246  Weight: 74.8 kg    Examination:  General exam: Appears calm and comfortable  Respiratory system: Clear to auscultation. Respiratory effort normal. Cardiovascular system: S1 & S2 heard, RRR.  Gastrointestinal system: Abdomen is soft Central nervous system: Alert and awake Extremities: Bilateral BKA, right BKA with dressings clean dry and intact. Skin: No significant lesions noted Psychiatry: Flat affect.    Data Reviewed: I have personally reviewed following labs and imaging studies  CBC: Recent Labs  Lab 10/21/20 1237 10/22/20 0620  WBC 16.5* 17.8*  NEUTROABS 13.7*  --   HGB 9.3* 8.8*  HCT 31.0* 29.5*  MCV 87.8 87.3  PLT 660* 660*   Basic Metabolic Panel: Recent Labs  Lab 10/21/20 1237 10/22/20 0620  NA 138 137  K 3.9 4.0  CL 102 102  CO2 30 29  GLUCOSE 120* 76  BUN 13 11  CREATININE 0.46* 0.52*  CALCIUM 8.8* 8.8*   GFR: Estimated Creatinine Clearance: 60.1 mL/min (A) (by C-G formula based on SCr of 0.52 mg/dL (L)). Liver Function Tests: Recent Labs  Lab 10/21/20 1237  AST 10*  ALT 9  ALKPHOS 97  BILITOT 0.4  PROT 6.6  ALBUMIN 2.8*   No results for input(s): LIPASE, AMYLASE in the last 168 hours. No results for input(s): AMMONIA in the last 168 hours. Coagulation Profile: No results for input(s): INR, PROTIME in the last 168 hours. Cardiac Enzymes: No results for input(s): CKTOTAL, CKMB, CKMBINDEX, TROPONINI in the last 168 hours. BNP (last 3 results) No results for input(s): PROBNP in the last 8760 hours. HbA1C: No results for input(s): HGBA1C in the last 72 hours. CBG: Recent Labs  Lab 10/21/20 2153  GLUCAP 107*   Lipid Profile: No results for input(s): CHOL, HDL, LDLCALC, TRIG, CHOLHDL, LDLDIRECT in the last 72 hours. Thyroid Function Tests: No results for input(s): TSH, T4TOTAL, FREET4,  T3FREE, THYROIDAB in the last 72 hours. Anemia Panel: No results for input(s): VITAMINB12, FOLATE, FERRITIN, TIBC, IRON, RETICCTPCT in the last 72 hours. Sepsis Labs: No results for input(s): PROCALCITON, LATICACIDVEN in the last 168 hours.  Recent Results (from the past 240 hour(s))  Blood culture (routine x 2)     Status: None (Preliminary result)   Collection Time: 10/21/20 12:38 PM   Specimen: Left Antecubital; Blood  Result Value Ref Range Status   Specimen Description   Final    LEFT ANTECUBITAL BOTTLES DRAWN AEROBIC AND ANAEROBIC   Special Requests   Final    Blood Culture adequate volume Performed at [facility], [address] [place], [place] [address]  Culture PENDING  Incomplete   Report Status PENDING  Incomplete  Blood culture (routine x 2)     Status: None (Preliminary result)   Collection Time: 10/21/20  1:52 PM   Specimen: Right Antecubital; Blood  Result Value Ref Range Status   Specimen Description   Final    RIGHT ANTECUBITAL BOTTLES DRAWN AEROBIC AND ANAEROBIC   Special Requests   Final    Blood Culture adequate volume Performed at [facility], [address] [place], [place] [address]    Culture PENDING  Incomplete   Report Status PENDING  Incomplete         Radiology Studies: DG Knee 1-2 Views Right  Result Date: 10/21/2020 CLINICAL DATA:  Postop for below the knee amputation with cellulitis. EXAM: RIGHT KNEE - 1-2 VIEW COMPARISON:  09/22/2020 CT FINDINGS: Interval below the knee amputation with surgical clips. Soft tissue swelling and gas about the surgical site. No gross osseous destruction. Bone fragment anterior and lateral to the distal fibula. Degenerative changes of the mediolateral compartments of the knee. Vascular calcifications. IMPRESSION: Soft tissue swelling and gas about the surgical site, status post below the knee a amputation. Findings suggest cellulitis and possible necrotizing fasciitis. No gross osseous destruction to confirm  recurrent osteomyelitis. Electronically Signed   By: Abigail Miyamoto M.D.   On: 10/21/2020 13:28        Scheduled Meds:  atorvastatin  10 mg Oral q1800   ferrous sulfate  324 mg Oral Q breakfast   heparin  5,000 Units Subcutaneous Q8H   insulin aspart  0-5 Units Subcutaneous QHS   insulin aspart  0-9 Units Subcutaneous TID WC   insulin detemir  10 Units Subcutaneous QHS   levETIRAcetam  500 mg Oral BID   oxyCODONE  10 mg Oral Q12H   pantoprazole  40 mg Oral Q0600   Continuous Infusions:  lactated ringers Stopped (10/21/20 2143)   piperacillin-tazobactam (ZOSYN)  IV 3.375 g (10/22/20 0636)   vancomycin 1,250 mg (10/22/20 3709)     LOS: 1 day    Time spent: 35 minutes    Shjon Lizarraga D Manuella Ghazi, DO [facility]  If 7PM-7AM, please contact night-coverage [email or web address] 10/22/2020, 7:45 AM

## 2020-10-22 NOTE — Progress Notes (Signed)
Pt received to room 5M09 via stretcher / ambulance services from [facility] for assessment and treatment of wound evisceration on right BKA stump.  Pt introduced to unit protocol and to staff / staff routine.  Bed locked and in low position.

## 2020-10-22 NOTE — ED Notes (Signed)
Called [facility] for transport to [facility]. 

## 2020-10-22 NOTE — Consult Note (Signed)
Hospital Consult    Reason for Consult:  Non-healing right BKA Referring Physician:  [facility] MRN #:  [id number]  History of Present Illness: This is a 80 y.o. male the presents as a transfer from [facility] for a nonhealing right below-knee amputation.  He has a history of stage IV lung cancer with brain metastasis and earlier this year had a PE in March and was placed on full and a coagulation but then he ended up with hemoptysis and an intracranial bleed and required craniotomy.  He is well-known to vascular surgery.  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  Most recently underwent right BKA by me on 09/28/2020.  States this has had purulent drainage for the last few days.  He arrived to [facility] ED and I was called.    Past Medical History:  Diagnosis Date   Diabetes mellitus without complication ([place])    Kidney stones    Lung cancer ([place]) 2022   PONV (postoperative nausea and vomiting)     Past Surgical History:  Procedure Laterality Date   ABDOMINAL AORTOGRAM W/LOWER EXTREMITY N/A 09/17/2018   Procedure: ABDOMINAL AORTOGRAM W/LOWER EXTREMITY;  Surgeon: Marty Heck, MD;  Location: [place] [facility];  Service: Cardiovascular;  Laterality: N/A;   AMPUTATION Left 09/22/2018   Procedure: AMPUTATION BELOW KNEE LEFT;  Surgeon: Marty Heck, MD;  Location: [place];  Service: Vascular;  Laterality: Left;   AMPUTATION Right 09/28/2020   Procedure: RIGHT BELOW KNEE AMPUTATION;  Surgeon: Marty Heck, MD;  Location: [place];  Service: Vascular;  Laterality: Right;   AMPUTATION TOE Left 09/10/2018   Procedure: AMPUTATION OF THIRD TOE LEFT FOOT, DEBRIDEMENT OF ULCERS ON SECOND TOE AND PARTIAL AMPUTATION SECOND TOE ON LEFT FOOT;  Surgeon: Virl Cagey, MD;  Location: [facility];  Service: General;  Laterality: Left;   APPLICATION OF CRANIAL NAVIGATION N/A 07/08/2020   Procedure: APPLICATION OF CRANIAL NAVIGATION;  Surgeon: Karsten Ro, DO;  Location: [place];  Service:  Neurosurgery;  Laterality: N/A;   APPLICATION OF WOUND VAC Left 09/18/2018   Procedure: APPLICATION OF WOUND VAC;  Surgeon: Marty Heck, MD;  Location: [place];  Service: Vascular;  Laterality: Left;   CRANIOTOMY Right 07/08/2020   Procedure: Right Occipital craniotomy for tumor resection with brainlab;  Surgeon: Dawley, Theodoro Doing, DO;  Location: [place];  Service: Neurosurgery;  Laterality: Right;   CYSTOSCOPY  05/08/2012   Procedure: CYSTOSCOPY;  Surgeon: Marissa Nestle, MD;  Location: [facility];  Service: Urology;  Laterality: N/A;  Evacuation of clots   IR IVC FILTER PLMT / S&I /IMG GUID/MOD SED  07/05/2020   KIDNEY SURGERY     sutures    PERIPHERAL VASCULAR BALLOON ANGIOPLASTY  09/17/2018   Procedure: PERIPHERAL VASCULAR BALLOON ANGIOPLASTY;  Surgeon: Marty Heck, MD;  Location: [place] [facility];  Service: Cardiovascular;;  LT SFA and AT   RADIOLOGY WITH ANESTHESIA N/A 06/30/2020   Procedure: MRI WITH ANESTHESIA   BRAIN WITH AND WITHOUT CONTRAST;  Surgeon: Radiologist, Medication, MD;  Location: [place];  Service: Radiology;  Laterality: N/A;   SPLENECTOMY     TRANSMETATARSAL AMPUTATION Left 09/18/2018   Procedure: TRANSMETATARSAL AMPUTATION LEFT FOOT;  Surgeon: Marty Heck, MD;  Location: [place];  Service: Vascular;  Laterality: Left;   TRANSURETHRAL RESECTION OF PROSTATE  05/14/2012   Procedure: TRANSURETHRAL RESECTION OF THE PROSTATE (TURP);  Surgeon: Marissa Nestle, MD;  [facility]: [facility];  Service:  Urology;  Laterality: N/A;    No Known Allergies  Prior to Admission medications   Medication Sig Start Date End Date Taking? Authorizing Provider  acetaminophen (TYLENOL) 325 MG tablet Take 2 tablets (650 mg total) by mouth every 4 (four) hours as needed for headache or mild pain. Patient taking differently: Take 650 mg by mouth daily. 09/29/18  Yes Angiulli, Lavon Paganini, PA-C  Cal Carb-Mag Hydrox-Simeth (ROLAIDS ADVANCED) 1000-200-40 MG CHEW Chew 1 tablet by mouth daily as  needed (for indigestion).   Yes [provider]  clopidogrel (PLAVIX) 75 MG tablet Take 1 tablet (75 mg total) by mouth daily. 07/23/20  Yes Lavina Hamman, MD  ferrous sulfate 324 MG TBEC Take 324 mg by mouth daily with breakfast.   Yes [provider]  levETIRAcetam (KEPPRA) 500 MG tablet Take 1 tablet (500 mg total) by mouth 2 (two) times daily. 10/03/20 01/01/21 Yes [place] Eric J, DO  metFORMIN (GLUCOPHAGE) 500 MG tablet Take 1 tablet (500 mg total) by mouth 2 (two) times daily with a meal. 10/03/20 01/01/21 Yes [place] Eric J, DO  methocarbamol (ROBAXIN) 500 MG tablet Take 1 tablet (500 mg total) by mouth every 6 (six) hours as needed for muscle spasms. 10/03/20 11/02/20 Yes [place] Donnamarie Poag, DO  NARCAN 4 MG/0.1ML LIQD nasal spray kit SMARTSIG:Spray(s) Both Nares 10/06/20  Yes [provider]  oxyCODONE (OXYCONTIN) 10 mg 12 hr tablet Take 1 tablet (10 mg total) by mouth every 12 (twelve) hours. 10/19/20  Yes Gosrani, Nimish C, MD  oxyCODONE-acetaminophen (PERCOCET) 10-325 MG tablet Take 1 tablet by mouth every 4 (four) hours as needed. 10/06/20  Yes [provider]  pantoprazole (PROTONIX) 40 MG tablet Take 1 tablet (40 mg total) by mouth daily at 6 (six) AM. 07/14/20  Yes Lavina Hamman, MD  atorvastatin (LIPITOR) 10 MG tablet Take 1 tablet (10 mg total) by mouth daily at 6 PM. 04/14/20   Anastasio Champion, Nimish C, MD  loperamide (IMODIUM) 2 MG capsule Take 1 capsule (2 mg total) by mouth as needed for diarrhea or loose stools. Patient not taking: Reported on 10/19/2020 09/21/20   Doree Albee, MD  traZODone (DESYREL) 50 MG tablet Take 1 tablet (50 mg total) by mouth at bedtime. Patient not taking: Reported on 10/21/2020 04/14/20   Doree Albee, MD    Social History   Socioeconomic History   Marital status: Legally Separated    Spouse name: Not on file   Number of children: Not on file   Years of education: Not on file   Highest education level: Not on file   Occupational History   Not on file  Tobacco Use   Smoking status: Former    Packs/day: 0.50    Years: 45.00    Pack years: 22.50    Types: Cigarettes   Smokeless tobacco: Never   Tobacco comments:    4 cigarettes per day  Vaping Use   Vaping Use: Never used  Substance and Sexual Activity   Alcohol use: No   Drug use: No   Sexual activity: Not on file  Other Topics Concern   Not on file  Social History Narrative   Divorced since 2011.Lives with brother.Retired,previously maintenance work for [employer]   Social Determinants of Health   Financial Resource Strain: Low Risk    Difficulty of Paying Living Expenses: Not very hard  Food Insecurity: No Food Insecurity   Worried About Running Out of Food in the Last Year: Never true  Ran Out of Food in the Last Year: Never true  Transportation Needs: Unmet Transportation Needs   Lack of Transportation (Medical): Yes   Lack of Transportation (Non-Medical): Yes  Physical Activity: Not on file  Stress: Not on file  Social Connections: Socially Isolated   Frequency of Communication with Friends and Family: More than three times a week   Frequency of Social Gatherings with Friends and Family: More than three times a week   Attends Religious Services: Never   Marine scientist or Organizations: No   Attends Music therapist: Never   Marital Status: Separated  Human resources officer Violence: Not At Risk   Fear of Current or Ex-Partner: No   Emotionally Abused: No   Physically Abused: No   Sexually Abused: No     Family History  Problem Relation Age of Onset   Diabetes Mother     ROS: [x]  Positive   [ ]  Negative   [ ]  All sytems reviewed and are negative  Cardiovascular: []  chest pain/pressure []  palpitations []  SOB lying flat []  DOE []  pain in legs while walking []  pain in legs at rest []  pain in legs at night []  non-healing ulcers []  hx of DVT []  swelling in legs  Pulmonary: []  productive  cough []  asthma/wheezing []  home O2  Neurologic: []  weakness in []  arms []  legs []  numbness in []  arms []  legs []  hx of CVA []  mini stroke [] difficulty speaking or slurred speech []  temporary loss of vision in one eye []  dizziness  Hematologic: []  hx of cancer []  bleeding problems []  problems with blood clotting easily  Endocrine:   []  diabetes []  thyroid disease  GI []  vomiting blood []  blood in stool  GU: []  CKD/renal failure []  HD--[]  M/W/F or []  T/T/S []  burning with urination []  blood in urine  Psychiatric: []  anxiety []  depression  Musculoskeletal: []  arthritis []  joint pain  Integumentary: []  rashes []  ulcers  Constitutional: []  fever []  chills   Physical Examination  Vitals:   10/22/20 1200 10/22/20 1332  BP: (!) 138/51 (!) 97/54  Pulse: 90 98  Resp:  19  Temp: 97.8 F (36.6 C) 98.8 F (37.1 C)  SpO2: 95% 97%   Body mass index is 35.71 kg/m.  General:  WDWN in NAD Gait: Not observed HENT: WNL, normocephalic Pulmonary: normal non-labored breathing, without Rales, rhonchi,  wheezing Cardiac: regular, without  Murmurs, rubs or gallops Vascular Exam/Pulses: Palpable right femoral pulse Right BKA necrotic Left BKA well healed Musculoskeletal: no muscle wasting or atrophy  Neurologic: A&O X 3; Appropriate Affect ; SENSATION: normal; MOTOR FUNCTION:  moving all extremities equally. Speech is fluent/normal      CBC    Component Value Date/Time   WBC 17.8 (H) 10/22/2020 0620   RBC 3.38 (L) 10/22/2020 0620   HGB 8.8 (L) 10/22/2020 0620   HCT 29.5 (L) 10/22/2020 0620   PLT 592 (H) 10/22/2020 0620   MCV 87.3 10/22/2020 0620   MCH 26.0 10/22/2020 0620   MCHC 29.8 (L) 10/22/2020 0620   RDW 24.3 (H) 10/22/2020 0620   LYMPHSABS 1.3 10/21/2020 1237   MONOABS 1.1 (H) 10/21/2020 1237   EOSABS 0.1 10/21/2020 1237   BASOSABS 0.1 10/21/2020 1237    BMET    Component Value Date/Time   NA 137 10/22/2020 0620   K 4.0 10/22/2020 0620    CL 102 10/22/2020 0620   CO2 29 10/22/2020 0620   GLUCOSE 76 10/22/2020 0620   BUN 11  10/22/2020 0620   CREATININE 0.52 (L) 10/22/2020 0620   CREATININE 0.60 (L) 09/21/2020 1426   CALCIUM 8.8 (L) 10/22/2020 0620   GFRNONAA >60 10/22/2020 0620   GFRNONAA 96 09/21/2020 1426   GFRAA 112 09/21/2020 1426    COAGS: Lab Results  Component Value Date   INR 1.0 07/07/2020   INR 1.1 06/26/2020   INR 1.1 09/08/2018     Non-Invasive Vascular Imaging:    None  ASSESSMENT/PLAN: This is a 80 y.o. male with nonhealing right below-knee amputation.  There is obvious purulent drainage as noted above.  I discussed with him 2 options either conversion to an above-knee amputation or palliative care/hospice given his metastatic lung cancer with overall frail state.  Ultimately he wants to continue to try and get this to heal.  I will post him for right above-knee amputation tomorrow.  Risk benefits discussed.  Please keep n.p.o. after midnight.  Agree with broad-spectrum antibiotics.  Marty Heck, MD [facility] [place] Office: [place]

## 2020-10-22 NOTE — ED Notes (Signed)
[facility] given report.

## 2020-10-22 NOTE — ED Notes (Signed)
Dressing to patient's right BKA wound assessed. Gauze wrap with coban dry, clean, and intact. Patient's right leg elevated on pillow.

## 2020-10-23 ENCOUNTER — Encounter (HOSPITAL_COMMUNITY)
Admission: EM | Disposition: A | Discharge status: Home Health | Payer: Self-pay | Source: Home / Self Care | Attending: Internal Medicine

## 2020-10-23 ENCOUNTER — Inpatient Hospital Stay (HOSPITAL_COMMUNITY): Payer: Medicare HMO | Admitting: Anesthesiology

## 2020-10-23 ENCOUNTER — Encounter (HOSPITAL_COMMUNITY): Payer: Self-pay | Admitting: Family Medicine

## 2020-10-23 DIAGNOSIS — Y835 Amputation of limb(s) as the cause of abnormal reaction of the patient, or of later complication, without mention of misadventure at the time of the procedure: Secondary | ICD-10-CM

## 2020-10-23 DIAGNOSIS — T8149XA Infection following a procedure, other surgical site, initial encounter: Secondary | ICD-10-CM | POA: Diagnosis not present

## 2020-10-23 DIAGNOSIS — T8753 Necrosis of amputation stump, right lower extremity: Secondary | ICD-10-CM

## 2020-10-23 HISTORY — PX: AMPUTATION: SHX166

## 2020-10-23 LAB — BASIC METABOLIC PANEL
Anion gap: 6 (ref 5–15)
BUN: 12 mg/dL (ref 8–23)
CO2: 29 mmol/L (ref 22–32)
Calcium: 8.4 mg/dL — ABNORMAL LOW (ref 8.9–10.3)
Chloride: 99 mmol/L (ref 98–111)
Creatinine, Ser: 0.66 mg/dL (ref 0.61–1.24)
GFR, Estimated: >60 mL/min (ref >60)
Glucose, Bld: 97 mg/dL (ref 70–99)
Potassium: 4 mmol/L (ref 3.5–5.1)
Sodium: 134 mmol/L — ABNORMAL LOW (ref 135–145)

## 2020-10-23 LAB — GLUCOSE, CAPILLARY
Glucose-Capillary: 81 mg/dL (ref 70–99)
Glucose-Capillary: 87 mg/dL (ref 70–99)
Glucose-Capillary: 119 mg/dL — ABNORMAL HIGH (ref 70–99)
Glucose-Capillary: 165 mg/dL — ABNORMAL HIGH (ref 70–99)
Glucose-Capillary: 146 mg/dL — ABNORMAL HIGH (ref 70–99)

## 2020-10-23 LAB — CBC
HCT: 28 % — ABNORMAL LOW (ref 39.0–52.0)
Hemoglobin: 8.5 g/dL — ABNORMAL LOW (ref 13.0–17.0)
MCH: 26.2 pg (ref 26.0–34.0)
MCHC: 30.4 g/dL (ref 30.0–36.0)
MCV: 86.4 fL (ref 80.0–100.0)
Platelets: 565 10*3/uL — ABNORMAL HIGH (ref 150–400)
RBC: 3.24 MIL/uL — ABNORMAL LOW (ref 4.22–5.81)
RDW: 23.7 % — ABNORMAL HIGH (ref 11.5–15.5)
WBC: 15.8 10*3/uL — ABNORMAL HIGH (ref 4.0–10.5)
nRBC: 0 % (ref 0.0–0.2)

## 2020-10-23 LAB — SURGICAL PCR SCREEN
MRSA, PCR: POSITIVE — AB
Staphylococcus aureus: POSITIVE — AB

## 2020-10-23 LAB — MAGNESIUM: Magnesium: 1.9 mg/dL (ref 1.7–2.4)

## 2020-10-23 SURGERY — AMPUTATION, ABOVE KNEE
Anesthesia: General | Site: Knee | Laterality: Right

## 2020-10-23 MED ORDER — BACITRACIN ZINC 500 UNIT/GM EX OINT
TOPICAL_OINTMENT | CUTANEOUS | Status: AC
  Filled 2020-10-23: qty 28.35

## 2020-10-23 MED ORDER — DEXAMETHASONE SODIUM PHOSPHATE 10 MG/ML IJ SOLN
INTRAMUSCULAR | Status: DC | PRN
  Administered 2020-10-23: 5 mg via INTRAVENOUS

## 2020-10-23 MED ORDER — DOCUSATE SODIUM 100 MG PO CAPS
100.0000 mg | ORAL_CAPSULE | Freq: Every day | ORAL | Status: DC
  Administered 2020-10-24 – 2020-11-01 (×9): 100 mg via ORAL
  Filled 2020-10-23 (×11): qty 1

## 2020-10-23 MED ORDER — FENTANYL CITRATE (PF) 250 MCG/5ML IJ SOLN
INTRAMUSCULAR | Status: DC | PRN
  Administered 2020-10-23 (×2): 25 ug via INTRAVENOUS
  Administered 2020-10-23: 100 ug via INTRAVENOUS

## 2020-10-23 MED ORDER — PHENYLEPHRINE 40 MCG/ML (10ML) SYRINGE FOR IV PUSH (FOR BLOOD PRESSURE SUPPORT)
PREFILLED_SYRINGE | INTRAVENOUS | Status: DC | PRN
  Administered 2020-10-23: 80 ug via INTRAVENOUS

## 2020-10-23 MED ORDER — HYDROMORPHONE HCL 1 MG/ML IJ SOLN
INTRAMUSCULAR | Status: AC
  Filled 2020-10-23: qty 1

## 2020-10-23 MED ORDER — HYDROMORPHONE HCL 1 MG/ML IJ SOLN
INTRAMUSCULAR | Status: AC
  Administered 2020-10-23: 0.5 mg via INTRAVENOUS
  Filled 2020-10-23: qty 1

## 2020-10-23 MED ORDER — PHENOL 1.4 % MT LIQD: 1.0000 | OROMUCOSAL | Status: DC | PRN

## 2020-10-23 MED ORDER — FENTANYL CITRATE (PF) 250 MCG/5ML IJ SOLN
INTRAMUSCULAR | Status: AC
  Filled 2020-10-23: qty 5

## 2020-10-23 MED ORDER — CHLORHEXIDINE GLUCONATE 0.12 % MT SOLN
15.0000 mL | Freq: Once | OROMUCOSAL | Status: AC
  Administered 2020-10-23: 15 mL via OROMUCOSAL
  Filled 2020-10-23: qty 15

## 2020-10-23 MED ORDER — 0.9 % SODIUM CHLORIDE (POUR BTL) OPTIME
TOPICAL | Status: DC | PRN
  Administered 2020-10-23: 1000 mL

## 2020-10-23 MED ORDER — LIDOCAINE 2% (20 MG/ML) 5 ML SYRINGE
INTRAMUSCULAR | Status: DC | PRN
  Administered 2020-10-23: 100 mg via INTRAVENOUS

## 2020-10-23 MED ORDER — PROPOFOL 10 MG/ML IV BOLUS
INTRAVENOUS | Status: AC
  Filled 2020-10-23: qty 20

## 2020-10-23 MED ORDER — OXYCODONE HCL 5 MG PO TABS: 5.0000 mg | ORAL_TABLET | Freq: Once | ORAL | Status: DC | PRN

## 2020-10-23 MED ORDER — CHLORHEXIDINE GLUCONATE CLOTH 2 % EX PADS
6.0000 | MEDICATED_PAD | Freq: Every day | CUTANEOUS | Status: AC
  Administered 2020-10-23 – 2020-10-27 (×5): 6 via TOPICAL

## 2020-10-23 MED ORDER — MUPIROCIN 2 % EX OINT
1.0000 "application " | TOPICAL_OINTMENT | Freq: Two times a day (BID) | CUTANEOUS | Status: AC
  Administered 2020-10-23 – 2020-10-27 (×9): 1 via NASAL
  Filled 2020-10-23 (×2): qty 22

## 2020-10-23 MED ORDER — PROPOFOL 10 MG/ML IV BOLUS
INTRAVENOUS | Status: DC | PRN
  Administered 2020-10-23: 100 mg via INTRAVENOUS

## 2020-10-23 MED ORDER — MIDAZOLAM HCL 2 MG/2ML IJ SOLN: 0.5000 mg | Freq: Once | INTRAMUSCULAR | Status: DC | PRN

## 2020-10-23 MED ORDER — CEFAZOLIN SODIUM-DEXTROSE 1-4 GM/50ML-% IV SOLN
1.0000 g | Freq: Three times a day (TID) | INTRAVENOUS | Status: AC
  Administered 2020-10-23 (×2): 1 g via INTRAVENOUS
  Filled 2020-10-23 (×2): qty 50

## 2020-10-23 MED ORDER — MEPERIDINE HCL 25 MG/ML IJ SOLN: 6.2500 mg | INTRAMUSCULAR | Status: DC | PRN

## 2020-10-23 MED ORDER — PROMETHAZINE HCL 25 MG/ML IJ SOLN: 6.2500 mg | INTRAMUSCULAR | Status: DC | PRN

## 2020-10-23 MED ORDER — EPHEDRINE SULFATE-NACL 50-0.9 MG/10ML-% IV SOSY
PREFILLED_SYRINGE | INTRAVENOUS | Status: DC | PRN
  Administered 2020-10-23: 10 mg via INTRAVENOUS

## 2020-10-23 MED ORDER — HYDROMORPHONE HCL 1 MG/ML IJ SOLN
0.2500 mg | INTRAMUSCULAR | Status: DC | PRN
  Administered 2020-10-23 (×2): 0.25 mg via INTRAVENOUS
  Administered 2020-10-23: 0.5 mg via INTRAVENOUS

## 2020-10-23 MED ORDER — OXYCODONE HCL 5 MG/5ML PO SOLN: 5.0000 mg | Freq: Once | ORAL | Status: DC | PRN

## 2020-10-23 MED ORDER — ONDANSETRON HCL 4 MG/2ML IJ SOLN
INTRAMUSCULAR | Status: DC | PRN
  Administered 2020-10-23: 4 mg via INTRAVENOUS

## 2020-10-23 SURGICAL SUPPLY — 48 items
BAG COUNTER SPONGE SURGICOUNT (BAG) ×2 IMPLANT
BAG SPNG CNTER NS LX DISP (BAG) ×1
BANDAGE ESMARK 6X9 LF (GAUZE/BANDAGES/DRESSINGS) IMPLANT
BLADE SAW SAG 73X25 THK (BLADE) ×1
BLADE SAW SGTL 73X25 THK (BLADE) ×1 IMPLANT
BNDG CMPR 9X6 STRL LF SNTH (GAUZE/BANDAGES/DRESSINGS)
BNDG COHESIVE 6X5 TAN STRL LF (GAUZE/BANDAGES/DRESSINGS) ×4 IMPLANT
BNDG ELASTIC 4X5.8 VLCR STR LF (GAUZE/BANDAGES/DRESSINGS) ×2 IMPLANT
BNDG ELASTIC 6X5.8 VLCR STR LF (GAUZE/BANDAGES/DRESSINGS) ×2 IMPLANT
BNDG ESMARK 6X9 LF (GAUZE/BANDAGES/DRESSINGS)
BNDG GAUZE ELAST 4 BULKY (GAUZE/BANDAGES/DRESSINGS) ×4 IMPLANT
CANISTER SUCT 3000ML PPV (MISCELLANEOUS) ×2 IMPLANT
CLIP VESOCCLUDE MED 6/CT (CLIP) ×2 IMPLANT
COVER BACK TABLE 60X90IN (DRAPES) ×2 IMPLANT
COVER SURGICAL LIGHT HANDLE (MISCELLANEOUS) ×4 IMPLANT
DRAIN CHANNEL 19F RND (DRAIN) IMPLANT
DRAPE HALF SHEET 40X57 (DRAPES) ×2 IMPLANT
DRAPE ORTHO SPLIT 77X108 STRL (DRAPES) ×4
DRAPE SURG ORHT 6 SPLT 77X108 (DRAPES) ×2 IMPLANT
DRAPE U-SHAPE 47X51 STRL (DRAPES) IMPLANT
DRSG ADAPTIC 3X8 NADH LF (GAUZE/BANDAGES/DRESSINGS) ×2 IMPLANT
ELECT CAUTERY BLADE 6.4 (BLADE) ×2 IMPLANT
ELECT REM PT RETURN 9FT ADLT (ELECTROSURGICAL) ×2
ELECTRODE REM PT RTRN 9FT ADLT (ELECTROSURGICAL) ×1 IMPLANT
EVACUATOR SILICONE 100CC (DRAIN) IMPLANT
GAUZE SPONGE 4X4 12PLY STRL (GAUZE/BANDAGES/DRESSINGS) ×4 IMPLANT
GLOVE SRG 8 PF TXTR STRL LF DI (GLOVE) ×1 IMPLANT
GLOVE SURG ENC MOIS LTX SZ7.5 (GLOVE) ×2 IMPLANT
GLOVE SURG UNDER POLY LF SZ8 (GLOVE) ×2
GOWN STRL REUS W/ TWL XL LVL3 (GOWN DISPOSABLE) ×1 IMPLANT
GOWN STRL REUS W/TWL XL LVL3 (GOWN DISPOSABLE) ×2
KIT BASIN OR (CUSTOM PROCEDURE TRAY) ×2 IMPLANT
KIT TURNOVER KIT B (KITS) ×2 IMPLANT
NS IRRIG 1000ML POUR BTL (IV SOLUTION) ×2 IMPLANT
PACK GENERAL/GYN (CUSTOM PROCEDURE TRAY) ×2 IMPLANT
PAD ARMBOARD 7.5X6 YLW CONV (MISCELLANEOUS) ×4 IMPLANT
STAPLER VISISTAT 35W (STAPLE) IMPLANT
STOCKINETTE IMPERVIOUS LG (DRAPES) ×2 IMPLANT
SUT ETHILON 3 0 PS 1 (SUTURE) IMPLANT
SUT SILK 0 TIES 10X30 (SUTURE) ×2 IMPLANT
SUT SILK 2 0 (SUTURE) ×2
SUT SILK 2 0 SH CR/8 (SUTURE) ×2 IMPLANT
SUT SILK 2-0 18XBRD TIE 12 (SUTURE) ×1 IMPLANT
SUT VIC AB 2-0 CT1 18 (SUTURE) ×4 IMPLANT
SUT VIC AB 3-0 SH 18 (SUTURE) ×2 IMPLANT
TOWEL GREEN STERILE (TOWEL DISPOSABLE) ×4 IMPLANT
UNDERPAD 30X36 HEAVY ABSORB (UNDERPADS AND DIAPERS) ×2 IMPLANT
WATER STERILE IRR 1000ML POUR (IV SOLUTION) ×2 IMPLANT

## 2020-10-23 NOTE — Anesthesia Procedure Notes (Signed)
Procedure Name: LMA Insertion Date/Time: 10/23/2020 7:49 AM Performed by: Trinna Post., CRNA Pre-anesthesia Checklist: Patient identified, Emergency Drugs available, Suction available, Patient being monitored and Timeout performed Patient Re-evaluated:Patient Re-evaluated prior to induction Oxygen Delivery Method: Circle system utilized Preoxygenation: Pre-oxygenation with 100% oxygen Induction Type: IV induction LMA: LMA inserted LMA Size: 4.0 Number of attempts: 1 Placement Confirmation: positive ETCO2 and breath sounds checked- equal and bilateral Tube secured with: Tape Dental Injury: Teeth and Oropharynx as per pre-operative assessment

## 2020-10-23 NOTE — Progress Notes (Signed)
Inpatient Rehab Admissions Coordinator:  Consult received. Will await therapy evaluations and recommendations to help determine appropriateness of CIR.  Will continue to follow.   Gayland Curry, [place], [place] Admissions Coordinator [phone]

## 2020-10-23 NOTE — Plan of Care (Signed)
  Problem: Education: Goal: Knowledge of General Education information will improve Description: Including pain rating scale, medication(s)/side effects and non-pharmacologic comfort measures Outcome: Completed/Met

## 2020-10-23 NOTE — Op Note (Signed)
    OPERATIVE NOTE   PROCEDURE: right above-the-knee amputation  PRE-OPERATIVE DIAGNOSIS: Nonhealing necrotic right below-knee amputation  POST-OPERATIVE DIAGNOSIS: same as above  SURGEON: Marty Heck, MD  ASSISTANT(S): Leontine Locket, PA  ANESTHESIA:  LMA  ESTIMATED BLOOD LOSS: <100 mL  FINDING(S): Right above-knee amputation with healthy tissue margins.  SPECIMEN(S):  right above-the-knee amputation  INDICATIONS:   Thomas Garcia is a 80 y.o. male who presents with nonhealing right below-knee amputation.  I discussed options of converting him to an above-knee amputation and he was agreeable to proceed.  The patient is scheduled for a right above-the-knee amputation.  I discussed in depth with the patient the risks, benefits, and alternatives to this procedure.  The patient is aware that the risk of this operation included but are not limited to:  bleeding, infection, myocardial infarction, stroke, death, failure to heal amputation wound, and possible need for more proximal amputation.  The patient is aware of the risks and agrees proceed forward with the procedure.  DESCRIPTION: After full informed written consent was obtained from the patient, the patient was brought back to the operating room, and placed supine upon the operating table.  Prior to induction, the patient received IV antibiotics.  The patient was then prepped and draped in the standard fashion for an above-the-knee amputation.  I marked out the anterior and posterior flaps for a fish-mouth type of amputation.  I made the incisions for these flaps, and then dissected through the subcutaneous tissue, fascia, and muscles circumferentially.  I elevated  the periosteal tissue 4 cm more proximal than the anterior skin flap.  I then transected the femur with a power saw at this level.  Then I smoothed out the rough edges of the bone.  I completed the posterior flap and the femoral vessels were clamped between kelly  clamps.  At this point, the specimen was passed off the field as the above-the-knee amputation.  At this point, I clamped all visibly bleeding arteries and veins using a combination of suture ligation with Vicryl suture and electrocautery.  The femoral vessels were ligated with 2-0 Silk and 2-0 Vicryl.   Bleeding continued to be controlled with electrocautery and suture ligature.  The stump was washed off with sterile normal saline and no further active bleeding was noted.  I reapproximated the anterior and posterior fascia  with interrupted stitches of 2-0 Vicryl.  This was completed along the entire length of anterior and posterior fascia until there were no more loose space in the fascial line.  The skin was then  reapproximated with staples.  The stump was washed off and dried.  The incision was dressed with [employer] and  then fluffs were applied.  Kerlix was wrapped around the leg and then gently an ACE wrap was applied.    COMPLICATIONS: None  CONDITION: Stable  Marty Heck, MD [facility] Office: [place]   10/23/2020, 8:46 AM

## 2020-10-23 NOTE — Anesthesia Preprocedure Evaluation (Addendum)
Anesthesia Evaluation  Patient identified by MRN, date of birth, ID band Patient awake    Reviewed: Allergy & Precautions, NPO status , Patient's Chart, lab work & pertinent test results  History of Anesthesia Complications (+) PONV  Airway Mallampati: I  TM Distance: >3 FB Neck ROM: Full    Dental  (+) Edentulous Upper, Edentulous Lower   Pulmonary COPD, Patient abstained from smoking., former smoker,  H/o lung cancer, brain met 09/24/2020 SARS coronavirus POS   breath sounds clear to auscultation       Cardiovascular + Peripheral Vascular Disease   Rhythm:Regular Rate:Normal  '20 ECHO: EF 60-65%. The cavity size was normal, mild LVH, no significant valvular abnormalities   Neuro/Psych S/p craniotomy for brain met, XRT    GI/Hepatic Neg liver ROS, GERD  Medicated and Controlled,  Endo/Other  diabetes (glu 81), Oral Hypoglycemic Agents  Renal/GU stones     Musculoskeletal   Abdominal   Peds  Hematology  (+) Blood dyscrasia (Hb 8.5), anemia , plavix   Anesthesia Other Findings   Reproductive/Obstetrics                            Anesthesia Physical Anesthesia Plan  ASA: 4  Anesthesia Plan: General   Post-op Pain Management:    Induction: Intravenous  PONV Risk Score and Plan: 3 and Ondansetron, Dexamethasone and Treatment may vary due to age or medical condition  Airway Management Planned: LMA  Additional Equipment: None  Intra-op Plan:   Post-operative Plan:   Informed Consent: I have reviewed the patients History and Physical, chart, labs and discussed the procedure including the risks, benefits and alternatives for the proposed anesthesia with the patient or authorized representative who has indicated his/her understanding and acceptance.   Patient has DNR.  Discussed DNR with patient and Suspend DNR.     Plan Discussed with: CRNA and Surgeon  Anesthesia Plan  Comments:        Anesthesia Quick Evaluation

## 2020-10-23 NOTE — Anesthesia Postprocedure Evaluation (Signed)
Anesthesia Post Note  Patient: Thomas Garcia  Procedure(s) Performed: REVISION OF RIGHT  ABOVE KNEE AMPUTATION (Right: Knee)     Patient location during evaluation: PACU Anesthesia Type: General Level of consciousness: sedated, patient cooperative and oriented Pain management: pain level controlled Vital Signs Assessment: post-procedure vital signs reviewed and stable Respiratory status: spontaneous breathing, nonlabored ventilation, respiratory function stable and patient connected to nasal cannula oxygen Cardiovascular status: blood pressure returned to baseline and stable Postop Assessment: no apparent nausea or vomiting Anesthetic complications: no   No notable events documented.  Last Vitals:  Vitals:   10/23/20 0925 10/23/20 0940  BP: (!) 123/52 (!) 133/58  Pulse: 91 95  Resp: 16 17  Temp: 36.6 C   SpO2: 95% 95%    Last Pain:  Vitals:   10/23/20 0925  TempSrc:   PainSc: 6                  Thomas Garcia,E. Fahima Cifelli

## 2020-10-23 NOTE — Progress Notes (Signed)
[facility]   Subjective  - agreeable to proceed with right AKA.   Objective 138/79 82 98.5 F (36.9 C) (Oral) 20 (!) 20%  Intake/Output Summary (Last 24 hours) at 10/23/2020 0735 Last data filed at 10/23/2020 0600 Gross per 24 hour  Intake 1726.59 ml  Output 150 ml  Net 1576.59 ml      Laboratory Lab Results: Recent Labs    10/22/20 0620 10/23/20 0433  WBC 17.8* 15.8*  HGB 8.8* 8.5*  HCT 29.5* 28.0*  PLT 592* 565*   BMET Recent Labs    10/22/20 0620 10/23/20 0433  NA 137 134*  K 4.0 4.0  CL 102 99  CO2 29 29  GLUCOSE 76 97  BUN 11 12  CREATININE 0.52* 0.66  CALCIUM 8.8* 8.4*    COAG Lab Results  Component Value Date   INR 1.0 07/07/2020   INR 1.1 06/26/2020   INR 1.1 09/08/2018   No results found for: PTT  Assessment/Planning:  Non-healing infected right BKA.  Discussed plan to convert to right AKA.  Risks and benefits again discussed.  Marty Heck 10/23/2020 7:35 AM --

## 2020-10-23 NOTE — Transfer of Care (Signed)
Immediate Anesthesia Transfer of Care Note  Patient: Thomas Garcia  Procedure(s) Performed: REVISION OF RIGHT  ABOVE KNEE AMPUTATION (Right: Knee)  Patient Location: PACU  Anesthesia Type:General  Level of Consciousness: awake, alert  and oriented  Airway & Oxygen Therapy: Patient Spontanous Breathing  Post-op Assessment: Report given to RN and Post -op Vital signs reviewed and stable  Post vital signs: Reviewed and stable  Last Vitals:  Vitals Value Taken Time  BP 113/56 10/23/20 0855  Temp    Pulse 85 10/23/20 0856  Resp 26 10/23/20 0856  SpO2 99 % 10/23/20 0856  Vitals shown include unvalidated device data.  Last Pain:  Vitals:   10/23/20 0719  TempSrc: Oral  PainSc:          Complications: No notable events documented.

## 2020-10-23 NOTE — Plan of Care (Signed)
  Problem: Clinical Measurements: Goal: Diagnostic test results will improve Outcome: Completed/Met   Problem: Nutrition: Goal: Adequate nutrition will be maintained Outcome: Completed/Met   Problem: Coping: Goal: Level of anxiety will decrease Outcome: Completed/Met   Problem: Education: Goal: Knowledge of the prescribed therapeutic regimen will improve Outcome: Completed/Met Goal: Ability to verbalize activity precautions or restrictions will improve Outcome: Completed/Met Goal: Understanding of discharge needs will improve Outcome: Completed/Met

## 2020-10-23 NOTE — Progress Notes (Signed)
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  The last BKA was done on 09/28/2020 by Dr. Carlis Abbott.  He was recovering well until about 2 weeks ago he noticed some drainage from the incision site.  Patient has been admitted with wound infection after recent surgery.  He was transferred to [facility] to be evaluated by vascular surgery.  Patient underwent above-knee amputation on 7/17.   Assessment & Plan:   Principal Problem:   Wound infection after surgery Active Problems:   Benign essential HTN   Essential hypertension   Diabetes mellitus type 2 in nonobese (HCC)   History of pulmonary embolism   Malignant neoplasm of upper lobe of right lung (HCC)  1-Wound infection after recent right BKA: -X ray: Soft tissue swelling and gas about the surgical site, status post below the knee a amputation. Findings suggest cellulitis and possible necrotizing fasciitis. No gross osseous destruction to confirm recurrent osteomyelitis. -Continue with IV vancomycin and Cefepime.  -Patient underwent AKA on 7/17 -WBC trending down.   2-Type 2 Diabetes: Continue to hold home metformin. Continue with correction insulin.  Hold Levemir due to hypoglycemia and poor oral intake.  Follow CBGs  3-history of PE: Noted to have a small peripheral PEs in the past and patient has declined anticoagulation previously.  Neoplasm of the right lung with metastatic disease: Need to follow-up with his oncologist   Estimated body mass index is 35.64 kg/m as calculated from the following:   Height as of this encounter: 4\' 9"  (1.448 m).   Weight as of this encounter: 74.7 kg.   DVT prophylaxis: Heparin Code Status: DNR Family  Communication: Care discussed with patient Disposition Plan:  Status is: Inpatient  Remains inpatient appropriate because:IV treatments appropriate due to intensity of illness or inability to take PO  Dispo: The patient is from: Home              Anticipated d/c is to:  To be determined              Patient currently is not medically stable to d/c.   Difficult to place patient No        Consultants:  Dr. Carlis Abbott with vascular surgery  Procedures:  AKA 7/17  Antimicrobials:  Vancomycin and cefepime  Subjective: Came from surgery.  He denies worsening pain.  He will will try to eat  some   Objective: Vitals:   10/23/20 0910 10/23/20 0925 10/23/20 0940 10/23/20 1010  BP: (!) 131/57 (!) 123/52 (!) 133/58 (!) 116/57  Pulse: 89 91 95 93  Resp: 19 16 17 18   Temp:  97.8 F (36.6 C)  97.9 F (36.6 C)  TempSrc:    Oral  SpO2: 97% 95% 95% 98%  Weight:      Height:        Intake/Output Summary (Last 24 hours) at 10/23/2020 1321 Last data filed at 10/23/2020 0900 Gross per 24 hour  Intake 1885.49 ml  Output 250 ml  Net 1635.49 ml   Filed Weights   10/21/20 1246 10/22/20 1337 10/22/20 2103  Weight: 74.8 kg 74.7 kg 74.7 kg    Examination:  General exam: Appears calm and comfortable  Respiratory system: Clear to auscultation. Respiratory effort normal. Cardiovascular system:  S1 & S2 heard, RRR.  Gastrointestinal system: Abdomen is nondistended, soft and nontender. No organomegaly or masses felt. Normal bowel sounds heard. Central nervous system: Alert and oriented.  Extremities. R AKA with dressing. Left BKA   Data Reviewed: I have personally reviewed following labs and imaging studies  CBC: Recent Labs  Lab 10/21/20 1237 10/22/20 0620 10/23/20 0433  WBC 16.5* 17.8* 15.8*  NEUTROABS 13.7*  --   --   HGB 9.3* 8.8* 8.5*  HCT 31.0* 29.5* 28.0*  MCV 87.8 87.3 86.4  PLT 660* 592* 470*   Basic Metabolic Panel: Recent Labs  Lab 10/21/20 1237 10/22/20 0620  10/23/20 0433  NA 138 137 134*  K 3.9 4.0 4.0  CL 102 102 99  CO2 30 29 29   GLUCOSE 120* 76 97  BUN 13 11 12   CREATININE 0.46* 0.52* 0.66  CALCIUM 8.8* 8.8* 8.4*  MG  --   --  1.9   GFR: Estimated Creatinine Clearance: 60 mL/min (by C-G formula based on SCr of 0.66 mg/dL). Liver Function Tests: Recent Labs  Lab 10/21/20 1237  AST 10*  ALT 9  ALKPHOS 97  BILITOT 0.4  PROT 6.6  ALBUMIN 2.8*   No results for input(s): LIPASE, AMYLASE in the last 168 hours. No results for input(s): AMMONIA in the last 168 hours. Coagulation Profile: No results for input(s): INR, PROTIME in the last 168 hours. Cardiac Enzymes: No results for input(s): CKTOTAL, CKMB, CKMBINDEX, TROPONINI in the last 168 hours. BNP (last 3 results) No results for input(s): PROBNP in the last 8760 hours. HbA1C: No results for input(s): HGBA1C in the last 72 hours. CBG: Recent Labs  Lab 10/22/20 1645 10/22/20 2103 10/23/20 0635 10/23/20 0859 10/23/20 1145  GLUCAP 88 78 81 87 119*   Lipid Profile: No results for input(s): CHOL, HDL, LDLCALC, TRIG, CHOLHDL, LDLDIRECT in the last 72 hours. Thyroid Function Tests: No results for input(s): TSH, T4TOTAL, FREET4, T3FREE, THYROIDAB in the last 72 hours. Anemia Panel: No results for input(s): VITAMINB12, FOLATE, FERRITIN, TIBC, IRON, RETICCTPCT in the last 72 hours. Sepsis Labs: No results for input(s): PROCALCITON, LATICACIDVEN in the last 168 hours.  Recent Results (from the past 240 hour(s))  Blood culture (routine x 2)     Status: None (Preliminary result)   Collection Time: 10/21/20 12:38 PM   Specimen: Left Antecubital; Blood  Result Value Ref Range Status   Specimen Description   Final    LEFT ANTECUBITAL BOTTLES DRAWN AEROBIC AND ANAEROBIC   Special Requests Blood Culture adequate volume  Final   Culture   Final    NO GROWTH 2 DAYS Performed at [facility], [address] [place], [place] [address]    Report Status PENDING  Incomplete  Blood  culture (routine x 2)     Status: None (Preliminary result)   Collection Time: 10/21/20  1:52 PM   Specimen: Right Antecubital; Blood  Result Value Ref Range Status   Specimen Description   Final    RIGHT ANTECUBITAL BOTTLES DRAWN AEROBIC AND ANAEROBIC   Special Requests Blood Culture adequate volume  Final   Culture   Final    NO GROWTH 2 DAYS Performed at [facility], [address], [place],  [address]    Report Status PENDING  Incomplete  Surgical pcr screen     Status: Abnormal   Collection Time: 10/22/20 10:40 PM   Specimen: Nasal Mucosa; Nasal Swab  Result Value Ref Range Status   MRSA, PCR POSITIVE (A) NEGATIVE  Final    Comment: RESULT CALLED TO, READ BACK BY AND VERIFIED WITH: E. CASTRO RN, AT [phone] 10/23/20 D. VANHOOK    Staphylococcus aureus POSITIVE (A) NEGATIVE Final    Comment: (NOTE) The Xpert SA Assay (FDA approved for NASAL specimens in patients 30 years of age and older), is one component of a comprehensive surveillance program. It is not intended to diagnose infection nor to guide or monitor treatment. Performed at [facility], [place] [address], [place], [place] [address]          Radiology Studies: No results found.      Scheduled Meds:  atorvastatin  10 mg Oral q1800   Chlorhexidine Gluconate Cloth  6 each Topical Q0600   [START ON 10/24/2020] docusate sodium  100 mg Oral Daily   feeding supplement  237 mL Oral BID BM   ferrous sulfate  324 mg Oral Q breakfast   heparin  5,000 Units Subcutaneous Q8H   HYDROmorphone       insulin aspart  0-9 Units Subcutaneous TID WC   levETIRAcetam  500 mg Oral BID   mupirocin ointment  1 application Nasal BID   nicotine  7 mg Transdermal Daily   oxyCODONE  10 mg Oral Q12H   pantoprazole  40 mg Oral Q0600   Continuous Infusions:   ceFAZolin (ANCEF) IV     lactated ringers 75 mL/hr at 10/23/20 0738   piperacillin-tazobactam (ZOSYN)  IV 3.375 g (10/23/20 0538)   vancomycin 1,250 mg (10/23/20  0536)     LOS: 2 days    Time spent: 35 minutes.     Elmarie Shiley, MD [facility]   If 7PM-7AM, please contact night-coverage [email or web address]  10/23/2020, 1:21 PM

## 2020-10-24 ENCOUNTER — Encounter (HOSPITAL_COMMUNITY): Payer: Self-pay | Admitting: Vascular Surgery

## 2020-10-24 DIAGNOSIS — T8149XA Infection following a procedure, other surgical site, initial encounter: Secondary | ICD-10-CM | POA: Diagnosis not present

## 2020-10-24 LAB — BASIC METABOLIC PANEL
Anion gap: 8 (ref 5–15)
BUN: 12 mg/dL (ref 8–23)
CO2: 24 mmol/L (ref 22–32)
Calcium: 8.1 mg/dL — ABNORMAL LOW (ref 8.9–10.3)
Chloride: 105 mmol/L (ref 98–111)
Creatinine, Ser: 0.61 mg/dL (ref 0.61–1.24)
GFR, Estimated: >60 mL/min (ref >60)
Glucose, Bld: 114 mg/dL — ABNORMAL HIGH (ref 70–99)
Potassium: 4.7 mmol/L (ref 3.5–5.1)
Sodium: 137 mmol/L (ref 135–145)

## 2020-10-24 LAB — CBC
HCT: 24.5 % — ABNORMAL LOW (ref 39.0–52.0)
Hemoglobin: 7.4 g/dL — ABNORMAL LOW (ref 13.0–17.0)
MCH: 26.1 pg (ref 26.0–34.0)
MCHC: 30.2 g/dL (ref 30.0–36.0)
MCV: 86.6 fL (ref 80.0–100.0)
Platelets: 457 10*3/uL — ABNORMAL HIGH (ref 150–400)
RBC: 2.83 MIL/uL — ABNORMAL LOW (ref 4.22–5.81)
RDW: 23.4 % — ABNORMAL HIGH (ref 11.5–15.5)
WBC: 19.4 10*3/uL — ABNORMAL HIGH (ref 4.0–10.5)
nRBC: 0 % (ref 0.0–0.2)

## 2020-10-24 LAB — GLUCOSE, CAPILLARY
Glucose-Capillary: 129 mg/dL — ABNORMAL HIGH (ref 70–99)
Glucose-Capillary: 161 mg/dL — ABNORMAL HIGH (ref 70–99)
Glucose-Capillary: 152 mg/dL — ABNORMAL HIGH (ref 70–99)
Glucose-Capillary: 143 mg/dL — ABNORMAL HIGH (ref 70–99)

## 2020-10-24 MED ORDER — NYSTATIN 100000 UNIT/ML MT SUSP
5.0000 mL | Freq: Four times a day (QID) | OROMUCOSAL | Status: DC
  Administered 2020-10-24 – 2020-11-01 (×33): 500000 [IU] via ORAL
  Filled 2020-10-24 (×32): qty 5

## 2020-10-24 MED ORDER — METRONIDAZOLE 500 MG/100ML IV SOLN: 500.0000 mg | Freq: Two times a day (BID) | INTRAVENOUS | Status: DC

## 2020-10-24 MED ORDER — SODIUM CHLORIDE 0.9 % IV SOLN
2.0000 g | Freq: Three times a day (TID) | INTRAVENOUS | Status: DC
  Administered 2020-10-24 – 2020-10-25 (×3): 2 g via INTRAVENOUS
  Filled 2020-10-24 (×5): qty 2

## 2020-10-24 MED ORDER — METRONIDAZOLE 500 MG/100ML IV SOLN
500.0000 mg | Freq: Three times a day (TID) | INTRAVENOUS | Status: DC
  Administered 2020-10-24 – 2020-10-26 (×6): 500 mg via INTRAVENOUS
  Filled 2020-10-24 (×6): qty 100

## 2020-10-24 NOTE — Progress Notes (Addendum)
  Progress Note    10/24/2020 8:21 AM 1 Day Post-Op  Subjective:  no major complaints   Vitals:   10/23/20 2047 10/24/20 0523  BP: 100/67 (!) 125/48  Pulse: 78 74  Resp: 17 16  Temp: 98.4 F (36.9 C) 98.3 F (36.8 C)  SpO2: 93% 95%   Physical Exam: Cardiac:  regular Lungs:  non labored Incisions:  right AKA dressings clean, dry and intact Extremity: 2+ right femoral pulse Neurologic: alert and oriented  CBC    Component Value Date/Time   WBC 15.8 (H) 10/23/2020 0433   RBC 3.24 (L) 10/23/2020 0433   HGB 8.5 (L) 10/23/2020 0433   HCT 28.0 (L) 10/23/2020 0433   PLT 565 (H) 10/23/2020 0433   MCV 86.4 10/23/2020 0433   MCH 26.2 10/23/2020 0433   MCHC 30.4 10/23/2020 0433   RDW 23.7 (H) 10/23/2020 0433   LYMPHSABS 1.3 10/21/2020 1237   MONOABS 1.1 (H) 10/21/2020 1237   EOSABS 0.1 10/21/2020 1237   BASOSABS 0.1 10/21/2020 1237    BMET    Component Value Date/Time   NA 137 10/24/2020 0644   K 4.7 10/24/2020 0644   CL 105 10/24/2020 0644   CO2 24 10/24/2020 0644   GLUCOSE 114 (H) 10/24/2020 0644   BUN 12 10/24/2020 0644   CREATININE 0.61 10/24/2020 0644   CREATININE 0.60 (L) 09/21/2020 1426   CALCIUM 8.1 (L) 10/24/2020 0644   GFRNONAA >60 10/24/2020 0644   GFRNONAA 96 09/21/2020 1426   GFRAA 112 09/21/2020 1426    INR    Component Value Date/Time   INR 1.0 07/07/2020 1807     Intake/Output Summary (Last 24 hours) at 10/24/2020 0821 Last data filed at 10/24/2020 0600 Gross per 24 hour  Intake 2318.05 ml  Output 450 ml  Net 1868.05 ml     Assessment/Plan:  80 y.o. male is s/p right AKA 1 Day Post-Op   Right AKA dressings clean and intact Pain well controlled Will take down dressings tomorrow Medical management per primary team   DVT prophylaxis:  sq heparin   Karoline Caldwell, PA-C [facility] [phone] 10/24/2020 8:21 AM  I have seen and evaluated the patient. I agree with the PA note as documented above. POD#1 s/p  conversion to right AKA.  Dressing down tomorrow.  Marty Heck, MD [facility] [place] Office: [phone]

## 2020-10-24 NOTE — Evaluation (Signed)
Physical Therapy Evaluation Patient Details Name: Thomas Garcia MRN: [id number] DOB: 08-Dec-1940 Today's Date: 10/24/2020   History of Present Illness  80 y.o. male presented to [facility] on 7/15 with nonhealing necrotic R BKA. Transferred to [facility] for revision s/p R AKA 7/17. PMHx significant for lung cancer with brain mets, L BKA, Hx of PE and DM with neuropathy.  Clinical Impression  PTA pt living with brother in single story home with ramped entrance. Pt reports he is mostly bed bound and brother transfers to wheelchair to go to appointments. Pt limited in ADLs to self feeding, requires assist for all other ADLs. Pt is currently limited in safe mobility by increased pain, altered CoG with amputation and generalized weakness. Pt is currently min A for coming to longsitting in bed and modAx2 for A-P transfer to chair. Pt's insurance will not cover CIR discharge so PT recommending short term SNF rehab prior to going home. PT will continue to follow acutely.      Follow Up Recommendations SNF    Equipment Recommendations  Hospital bed       Precautions / Restrictions Precautions Precautions: Fall Precaution Comments: R AKA and L BKA Restrictions Weight Bearing Restrictions: Yes RLE Weight Bearing: Non weight bearing LLE Weight Bearing: Non weight bearing      Mobility  Bed Mobility Overal bed mobility: Needs Assistance             General bed mobility comments: Able to transfer from supine to long sitting with use of bilateral rails and light Min A.    Transfers Overall transfer level: Needs assistance Equipment used: None Transfers: Comptroller transfers: Mod assist;+2 physical assistance;+2 safety/equipment   General transfer comment: AP transfer to recliner with Mod A +2 and mod cues for sequencing/hand placement.        Balance Overall balance assessment: Needs assistance Sitting-balance support: Bilateral upper extremity  supported Sitting balance-Leahy Scale: Fair Sitting balance - Comments: Min A to maintain static sitting at EOB with unilateral vs bilateral UE support on bed surface.                                     Pertinent Vitals/Pain Pain Assessment: 0-10 Pain Score: 9  Pain Location: R residual limb Pain Descriptors / Indicators: Aching;Grimacing;Guarding;Sore Pain Intervention(s): Limited activity within patient's tolerance;Monitored during session;Repositioned    Home Living Family/patient expects to be discharged to:: Private residence Living Arrangements: Other relatives;Other (Comment) (Brother [name])) Available Help at Discharge: Family;Available 24 hours/day Type of Home: Mobile home Home Access: Ramped entrance     Home Layout: One level Home Equipment: Walker - 2 wheels;Grab bars - toilet;Toilet riser;Bedside commode;Wheelchair - manual      Prior Function Level of Independence: Needs assistance   Gait / Transfers Assistance Needed: Patient reports being mostly bedbound PTA  ADL's / Homemaking Assistance Needed: Brother assists with bathing/dressing/toileting at bedlevel; able to feed himself        Hand Dominance   Dominant Hand: Right    Extremity/Trunk Assessment   Upper Extremity Assessment Upper Extremity Assessment: Defer to OT evaluation    Lower Extremity Assessment Lower Extremity Assessment: RLE deficits/detail;LLE deficits/detail RLE Deficits / Details: new R AKA, ROM limited by pain RLE: Unable to fully assess due to pain LLE Deficits / Details: prior L BKA, knee lacks full extension, strength in hip and knee grossly 3+/5  Cervical / Trunk Assessment Cervical / Trunk Assessment: Kyphotic  Communication   Communication: No difficulties  Cognition Arousal/Alertness: Awake/alert Behavior During Therapy: WFL for tasks assessed/performed Overall Cognitive Status: Impaired/Different from baseline Area of Impairment:  Orientation;Problem solving                 Orientation Level: Time           Problem Solving: Slow processing;Requires verbal cues;Requires tactile cues General Comments: Patient requires increased time to process verbal information; follows 1-2 step verbal commands with increased time and repeat cues.      General Comments General comments (skin integrity, edema, etc.): VSS on RA    Exercises     Assessment/Plan    PT Assessment Patient needs continued PT services  PT Problem List Decreased mobility;Decreased safety awareness;Decreased coordination;Decreased range of motion;Decreased activity tolerance;Pain       PT Treatment Interventions Therapeutic exercise;DME instruction;Balance training;Functional mobility training;Therapeutic activities;Patient/family education    PT Goals (Current goals can be found in the Care Plan section)  Acute Rehab PT Goals Patient Stated Goal: To return home. PT Goal Formulation: With patient Time For Goal Achievement: 11/07/20 Potential to Achieve Goals: Poor    Frequency Min 3X/week    AM-PAC PT "6 Clicks" Mobility  Outcome Measure Help needed turning from your back to your side while in a flat bed without using bedrails?: None Help needed moving from lying on your back to sitting on the side of a flat bed without using bedrails?: A Little Help needed moving to and from a bed to a chair (including a wheelchair)?: Total Help needed standing up from a chair using your arms (e.g., wheelchair or bedside chair)?: Total Help needed to walk in hospital room?: Total Help needed climbing 3-5 steps with a railing? : Total 6 Click Score: 11    End of Session   Activity Tolerance: Patient limited by pain;Other (comment) (self-limiting) Patient left: in chair;with call bell/phone within reach;with nursing/sitter in room;with chair alarm set Nurse Communication: Mobility status PT Visit Diagnosis: Other abnormalities of gait and  mobility (R26.89);Muscle weakness (generalized) (M62.81);Pain Pain - Right/Left: Right Pain - part of body: Leg    Time: 5035-4656 PT Time Calculation (min) (ACUTE ONLY): 23 min   Charges:   PT Evaluation $PT Eval Moderate Complexity: 1 Mod          Rye Decoste B. Migdalia Dk PT, DPT Acute Rehabilitation Services Pager [phone] Office [phone]   [place] 10/24/2020, 11:37 AM

## 2020-10-24 NOTE — Progress Notes (Signed)
  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    Interval Since Last Radiation:  5 weeks   08/23/2020 through 09/16/2020   Lung, Right: Lung_Rt 3D 37.5/37.5 2.5 15/15 6X, 10X    08/25/20-08/31/20 SRS Treatments: These sites were treated to 27 Gy in 3 fractions PTV_1 Rt Occ 27mm   PTV_3 Rt Cereb 11mm This site was treated to 20 Gy in 1 fraction PTV_2LtCereb33mm  Narrative:  The patient was contacted today for routine follow-up. During treatment he did very well with radiotherapy and did not have significant desquamation. He is currently at [facility] recovering from an AKA to his right leg for an infection in the stump.  Impression/Plan: 1. 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 I wasn't able to reach the patient today by phone but will reach out with him following his upcoming MRI of the brain. He will also follow up with medical oncology since he's not been seen that I can tell at [facility]    Carola Rhine, PAC

## 2020-10-24 NOTE — Plan of Care (Signed)
  Problem: Health Behavior/Discharge Planning: Goal: Ability to manage health-related needs will improve Outcome: Completed/Met   Problem: Clinical Measurements: Goal: Respiratory complications will improve Outcome: Completed/Met Goal: Cardiovascular complication will be avoided Outcome: Completed/Met

## 2020-10-24 NOTE — TOC Initial Note (Addendum)
Transition of Care [facility]) - Initial/Assessment Note    Patient Details  Name: Thomas Garcia MRN: [id number] Date of Birth: 1941-03-28  [facility]) CM/SW Contact:    Sable Feil, LCSW Phone Number: 10/24/2020, 5:16 PM  Clinical Narrative:  CSW and nurse case manager talked with patient and his brother Thomas Garcia at the bedside regarding his discharge disposition. CSW mentioned SNF for ST rehab being recommended and patient responded that he will be going home at discharge. CSW and RN case manager informed that his brother Thomas Garcia will be living with him to help take care of him and he had already talked with his PCP who has ordered his bed and transfer bench. Patient reported that he has received services through [facility] in the past and Palliative services was in place.                 Expected Discharge Plan: [place] Barriers to Discharge: Continued Medical Work up   Patient Goals and CMS Choice Patient states their goals for this hospitalization and ongoing recovery are:: Patient plans to return home with appropriate DME and HH services at [facility] Medicare.gov Compare Post Acute Care list provided to:: Other (Comment Required) (Not needed at this time. Patient indicated [place] used before) Choice offered to / list presented to : NA  Expected Discharge Plan and Services Expected Discharge Plan: [place] In-house Referral: Clinical Social Work Discharge Planning Services: CM Consult Post Acute Care Choice: Home Health, Durable Medical Equipment Living arrangements for the past 2 months: [place]                 DME Arranged:  (Patient reported talking with his doctor who is getting a hospital bed  and transfer bench for himr) DME Agency: [place] (Patient reported receving HH services through [place])                  Prior Living Arrangements/Services Living arrangements  for the past 2 months: Single Family Home Lives with:: Siblings (Patient's brother will be living with him to assist in his care) Patient language and need for interpreter reviewed:: No Do you feel safe going back to the place where you live?: Yes      Need for Family Participation in Patient Care: Yes (Comment) Care giver support system in place?: Yes (comment)   Criminal Activity/Legal Involvement Pertinent to Current Situation/Hospitalization: No - Comment as needed  Activities of Daily Living      Permission Sought/Granted Permission sought to share information with : Other (comment) (Talked with patient and brother at bedside)                Emotional Assessment Appearance:: Appears stated age Attitude/Demeanor/Rapport: Engaged Affect (typically observed): Appropriate Orientation: : Oriented to Self, Oriented to Place, Oriented to  Time, Oriented to Situation Alcohol / Substance Use:  (n/a) Psych Involvement: No (comment)  Admission diagnosis:  Wound infection [T14.8XXA, L08.9] Infection of amputation stump of right lower extremity ([place]) [T87.43] Wound infection after surgery [T81.49XA] Patient Active Problem List   Diagnosis Date Noted   Wound infection after surgery 10/21/2020   Heme + stool    Right foot ulcer ([place])    Acute on chronic anemia 09/22/2020   Acute anemia 09/22/2020   Ulcer of leg, chronic, right (HCC)    Malignant neoplasm of upper lobe of right lung ([place]) 08/04/2020   Brain metastasis ([place])  Right-sided nontraumatic intracerebral hemorrhage (HCC)    History of pulmonary embolism 06/27/2020   Brain mass 06/26/2020   Hyponatremia 06/26/2020   Microcytic anemia 06/26/2020   Cavitating mass in right upper lung lobe 05/27/2020   Colon cancer screening 06/22/2019   Abnormality of gait 11/06/2018   Diabetes mellitus type 2 in nonobese [facility])    Phantom limb pain (HCC)    Postoperative pain    Unilateral complete BKA, left, sequela (HCC)    Acute  blood loss anemia    Essential hypertension    Neuropathic pain    Unilateral complete BKA, left, initial encounter ([place])    Tobacco abuse    Benign essential HTN    Diabetic peripheral neuropathy (HCC)    Post-operative pain    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[place])    PVD (peripheral vascular disease) ([place])    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 ([place]) 09/08/2018   Osteomyelitis of second toe of left foot ([place]) 09/08/2018   Cellulitis of left foot 09/08/2018   Leukocytosis 09/08/2018   Fever and chills 09/08/2018   MOLE 10/26/2008   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

## 2020-10-24 NOTE — Progress Notes (Signed)
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  The last BKA was done on 09/28/2020 by Dr. Carlis Abbott.  He was recovering well until about 2 weeks ago he noticed some drainage from the incision site.  Patient has been admitted with wound infection after recent surgery.  He was transferred to [facility] to be evaluated by vascular surgery.  Patient underwent above-knee amputation on 7/17.   Assessment & Plan:   Principal Problem:   Wound infection after surgery Active Problems:   Benign essential HTN   Essential hypertension   Diabetes mellitus type 2 in nonobese (HCC)   History of pulmonary embolism   Malignant neoplasm of upper lobe of right lung (HCC)  1-Wound infection after recent right BKA: -X ray: Soft tissue swelling and gas about the surgical site, status post below the knee a amputation. Findings suggest cellulitis and possible necrotizing fasciitis. No gross osseous destruction to confirm recurrent osteomyelitis. -Continue with IV vancomycin and Cefepime/ flagyl.  -Patient underwent AKA on 7/17. -Follow WBC trend.   2-Type 2 Diabetes: Continue to hold home metformin. Continue with correction insulin.   Hold Levemir due to hypoglycemia and poor oral intake.  Follow CBGs  3-history of PE: Noted to have a small peripheral PEs in the past and patient has declined anticoagulation previously.  Neoplasm of the right lung with metastatic disease: Need to follow-up with his oncologist  4-Anemia; iron deficiency Hb lower today. Follow trend.  Hb lower post surgery.  Started  ferrous sulfate   Estimated body mass index is 35.64 kg/m as calculated from the following:   Height as of this encounter: 4'  9" (1.448 m).   Weight as of this encounter: 74.7 kg.   DVT prophylaxis: Heparin Code Status: DNR Family Communication: Care discussed with patient Disposition Plan:  Status is: Inpatient  Remains inpatient appropriate because:IV treatments appropriate due to intensity of illness or inability to take PO  Dispo: The patient is from: Home              Anticipated d/c is to:  To be determined              Patient currently is not medically stable to d/c.   Difficult to place patient No        Consultants:  Dr. Carlis Abbott with vascular surgery  Procedures:  AKA 7/17  Antimicrobials:  Vancomycin and cefepime  Subjective: Poor oral intake.  Denies pain.   Objective: Vitals:   10/23/20 1722 10/23/20 2047 10/24/20 0523 10/24/20 0900  BP: (!) 99/49 100/67 (!) 125/48 (!) 113/51  Pulse: 94 78 74 80  Resp: 18 17 16 18   Temp: 98.2 F (36.8 C) 98.4 F (36.9 C) 98.3 F (36.8 C) 98 F (36.7 C)  TempSrc:    Oral  SpO2: 96% 93% 95% 99%  Weight:      Height:        Intake/Output Summary (Last 24 hours) at 10/24/2020 1433 Last data filed at 10/24/2020 0900 Gross per 24 hour  Intake 1738.05 ml  Output 350 ml  Net 1388.05 ml    Filed Weights   10/21/20 1246 10/22/20 1337 10/22/20 2103  Weight: 74.8 kg 74.7 kg 74.7 kg    Examination:  General  exam: NAD Respiratory system: CTA Cardiovascular system: S 1, S2  RRR Gastrointestinal system: BS present, soft, nt Central nervous system: alert, and oriented.  Extremities. R AKA with dressing. Left BKA   Data Reviewed: I have personally reviewed following labs and imaging studies  CBC: Recent Labs  Lab 10/21/20 1237 10/22/20 0620 10/23/20 0433 10/24/20 1123  WBC 16.5* 17.8* 15.8* 19.4*  NEUTROABS 13.7*  --   --   --   HGB 9.3* 8.8* 8.5* 7.4*  HCT 31.0* 29.5* 28.0* 24.5*  MCV 87.8 87.3 86.4 86.6  PLT 660* 592* 565* 457*    Basic Metabolic Panel: Recent Labs  Lab 10/21/20 1237 10/22/20 0620 10/23/20 0433  10/24/20 0644  NA 138 137 134* 137  K 3.9 4.0 4.0 4.7  CL 102 102 99 105  CO2 30 29 29 24   GLUCOSE 120* 76 97 114*  BUN 13 11 12 12   CREATININE 0.46* 0.52* 0.66 0.61  CALCIUM 8.8* 8.8* 8.4* 8.1*  MG  --   --  1.9  --     GFR: Estimated Creatinine Clearance: 60 mL/min (by C-G formula based on SCr of 0.61 mg/dL). Liver Function Tests: Recent Labs  Lab 10/21/20 1237  AST 10*  ALT 9  ALKPHOS 97  BILITOT 0.4  PROT 6.6  ALBUMIN 2.8*    No results for input(s): LIPASE, AMYLASE in the last 168 hours. No results for input(s): AMMONIA in the last 168 hours. Coagulation Profile: No results for input(s): INR, PROTIME in the last 168 hours. Cardiac Enzymes: No results for input(s): CKTOTAL, CKMB, CKMBINDEX, TROPONINI in the last 168 hours. BNP (last 3 results) No results for input(s): PROBNP in the last 8760 hours. HbA1C: No results for input(s): HGBA1C in the last 72 hours. CBG: Recent Labs  Lab 10/23/20 1145 10/23/20 1721 10/23/20 2047 10/24/20 0642 10/24/20 1131  GLUCAP 119* 165* 146* 129* 161*    Lipid Profile: No results for input(s): CHOL, HDL, LDLCALC, TRIG, CHOLHDL, LDLDIRECT in the last 72 hours. Thyroid Function Tests: No results for input(s): TSH, T4TOTAL, FREET4, T3FREE, THYROIDAB in the last 72 hours. Anemia Panel: No results for input(s): VITAMINB12, FOLATE, FERRITIN, TIBC, IRON, RETICCTPCT in the last 72 hours. Sepsis Labs: No results for input(s): PROCALCITON, LATICACIDVEN in the last 168 hours.  Recent Results (from the past 240 hour(s))  Blood culture (routine x 2)     Status: None (Preliminary result)   Collection Time: 10/21/20 12:38 PM   Specimen: Left Antecubital; Blood  Result Value Ref Range Status   Specimen Description   Final    LEFT ANTECUBITAL BOTTLES DRAWN AEROBIC AND ANAEROBIC Performed at [facility], [address], [place], [place] [address]    Special Requests   Final    Blood Culture adequate volume Performed at [facility], [address] [place], [place] [address]    Culture   Final    NO GROWTH 3 DAYS Performed at [facility], [place] [address] [place], [place] [address]    Report Status PENDING  Incomplete  Blood culture (routine x 2)     Status: None (Preliminary result)   Collection Time: 10/21/20  1:52 PM   Specimen: Right Antecubital; Blood  Result Value Ref Range Status   Specimen Description   Final    RIGHT ANTECUBITAL BOTTLES DRAWN AEROBIC AND ANAEROBIC   Special Requests Blood Culture adequate volume  Final   Culture   Final    NO GROWTH 3 DAYS Performed at [facility], [address], [place], [place] [address]    Report Status PENDING  Incomplete  Surgical pcr screen     Status: Abnormal   Collection Time: 10/22/20 10:40 PM   Specimen: Nasal Mucosa; Nasal Swab  Result Value Ref Range Status   MRSA, PCR POSITIVE (A) NEGATIVE Final    Comment: RESULT CALLED TO, READ BACK BY AND VERIFIED WITH: E. CASTRO RN, AT [phone] 10/23/20 D. VANHOOK    Staphylococcus aureus POSITIVE (A) NEGATIVE Final    Comment: (NOTE) The Xpert SA Assay (FDA approved for NASAL specimens in patients 58 years of age and older), is one component of a comprehensive surveillance program. It is not intended to diagnose infection nor to guide or monitor treatment. Performed at [facility], [place] [address] [place], [place] [address]           Radiology Studies: No results found.      Scheduled Meds:  atorvastatin  10 mg Oral q1800   Chlorhexidine Gluconate Cloth  6 each Topical Q0600   docusate sodium  100 mg Oral Daily   feeding supplement  237 mL Oral BID BM   ferrous sulfate  324 mg Oral Q breakfast   heparin  5,000 Units Subcutaneous Q8H   insulin aspart  0-9 Units Subcutaneous TID WC   levETIRAcetam  500 mg Oral BID   mupirocin ointment  1 application Nasal BID   nicotine  7 mg Transdermal Daily   nystatin  5 mL Oral QID   oxyCODONE  10 mg Oral Q12H   pantoprazole  40 mg Oral Q0600    Continuous Infusions:  ceFEPime (MAXIPIME) IV 2 g (10/24/20 1330)   lactated ringers 75 mL/hr at 10/24/20 1135   metronidazole 500 mg (10/24/20 1316)   vancomycin 1,250 mg (10/24/20 0548)     LOS: 3 days    Time spent: 35 minutes.     Elmarie Shiley, MD [facility]   If 7PM-7AM, please contact night-coverage [email or web address]  10/24/2020, 2:33 PM

## 2020-10-24 NOTE — Progress Notes (Signed)
Pharmacy Antibiotic Note  NUR KRASINSKI is a 80 y.o. male admitted on 10/21/2020 with wound infection.  Pharmacy has been consulted for Vancomycin and Cefepime dosing. Received initial Vanc/Zosyn doses in ED. Discussed switching to Cefepime/Flagyl to reduce AKI risk with Dr. Tyrell Antonio.    Plan: Stop Zosyn Start Cefepime 2g Q8H Start Flagyl 500 Q8H Vancomycin 1250mg  IV Q24H Monitor renal function, cultures and narrow as able.  Height: 4\' 9"  (144.8 cm) Weight: 74.7 kg (164 lb 10.9 oz) IBW/kg (Calculated) : 43.1  Temp (24hrs), Avg:98.2 F (36.8 C), Min:98 F (36.7 C), Max:98.4 F (36.9 C)  Recent Labs  Lab 10/21/20 1237 10/22/20 0620 10/23/20 0433 10/24/20 0644  WBC 16.5* 17.8* 15.8*  --   CREATININE 0.46* 0.52* 0.66 0.61     Estimated Creatinine Clearance: 60 mL/min (by C-G formula based on SCr of 0.61 mg/dL).    No Known Allergies  Antimicrobials this admission: 7/15 Zosyn >> 7/18  7/15 Vancomycin >>  7/18 Cefepime >> 7/18 Flagyl >>   Microbiology results:  BCx: NG x3d   MRSA PCR: pos  Thank you for allowing pharmacy to be a part of this patient's care.  Laurey Arrow, PharmD PGY1 Pharmacy Resident 10/24/2020  11:50 AM  Please check [email or web address] for unit-specific pharmacy phone numbers.

## 2020-10-24 NOTE — Evaluation (Signed)
Occupational Therapy Evaluation Patient Details Name: Thomas Garcia MRN: [id number] DOB: 1941/03/09 Today's Date: 10/24/2020    History of Present Illness 80 y.o. male presented to [facility] on 7/15 with nonhealing necrotic R BKA. Transferred to [facility] for revision s/p R AKA 7/17. PMHx significant for lung cancer with brain mets, L BKA, Hx of PE and DM with neuropathy.   Clinical Impression   PTA patient was living with his brother in a mobile home with ramped entry and reports requiring assist for most ADLs including bathing/dressing/toileting at bedlevel. Patient states that he is grossly bedbound and does not get to his wheelchair. Patient currently requiring Min A for transfer from supine to long-sitting and Mod A +2 for AP transfer to recliner. Patient also limited by deficits listed below including generalized weakness, anxiety, and pain and would benefit from continued acute OT services in prep for safe d/c to next level of care with recommendation for SNF rehab. If patient refuses SNF, recommendation for 24hr supervision/assist and [facility] services (RN, NT, PT, OT). OT will continue to follow acutely.       Follow Up Recommendations  SNF;Other (comment);Supervision/Assistance - 24 hour (Patient refusing SNF; recommend [facility] services and 24hr supervision/assist)    Equipment Recommendations  Hospital bed    Recommendations for Other Services       Precautions / Restrictions Precautions Precautions: Fall Precaution Comments: R AKA and L BKA Restrictions Weight Bearing Restrictions: Yes RLE Weight Bearing: Non weight bearing LLE Weight Bearing: Non weight bearing      Mobility Bed Mobility Overal bed mobility: Needs Assistance             General bed mobility comments: Able to transfer from supine to long sitting with use of bilateral rails and light Min A.    Transfers Overall transfer level: Needs assistance Equipment used: None Transfers: Ecologist transfers: Mod assist;+2 physical assistance;+2 safety/equipment   General transfer comment: AP transfer to recliner with Mod A +2 and mod cues for sequencing/hand placement.    Balance Overall balance assessment: Needs assistance Sitting-balance support: Bilateral upper extremity supported Sitting balance-Leahy Scale: Fair Sitting balance - Comments: Min A to maintain static sitting at EOB with unilateral vs bilateral UE support on bed surface.                                   ADL either performed or assessed with clinical judgement   ADL Overall ADL's : Needs assistance/impaired     Grooming: Set up;Sitting                                 General ADL Comments: Patient limited by pain in R residual limb.     Vision Baseline Vision/History: Wears glasses Patient Visual Report: No change from baseline       Perception     Praxis      Pertinent Vitals/Pain Pain Assessment: 0-10 Pain Score: 9  Pain Location: R residual limb Pain Descriptors / Indicators: Aching;Grimacing;Guarding;Sore Pain Intervention(s): Limited activity within patient's tolerance;Monitored during session;Repositioned     Hand Dominance Right   Extremity/Trunk Assessment Upper Extremity Assessment Upper Extremity Assessment: Generalized weakness   Lower Extremity Assessment Lower Extremity Assessment: Defer to PT evaluation   Cervical / Trunk Assessment Cervical / Trunk Assessment: Kyphotic  Communication Communication Communication: No difficulties   Cognition Arousal/Alertness: Awake/alert Behavior During Therapy: WFL for tasks assessed/performed Overall Cognitive Status: Impaired/Different from baseline Area of Impairment: Orientation;Problem solving                 Orientation Level: Time           Problem Solving: Slow processing;Requires verbal cues;Requires tactile cues General Comments: Patient requires increased  time to process verbal information; follows 1-2 step verbal commands with increased time and repeat cues.   General Comments  VSS on RA.    Exercises     Shoulder Instructions      Home Living Family/patient expects to be discharged to:: Private residence Living Arrangements: Other relatives;Other (Comment) (Brother Thomas Garcia)) Available Help at Discharge: Family;Available 24 hours/day Type of Home: Mobile home Home Access: Ramped entrance     Home Layout: One level     Bathroom Shower/Tub: Teacher, early years/pre: Standard     Home Equipment: Walker - 2 wheels;Grab bars - toilet;Toilet riser;Bedside commode;Wheelchair - manual          Prior Functioning/Environment Level of Independence: Needs assistance  Gait / Transfers Assistance Needed: Patient reports being mostly bedbound PTA ADL's / Homemaking Assistance Needed: Brother assists with bathing/dressing/toileting at bedlevel; able to feed himself            OT Problem List: Decreased strength;Impaired balance (sitting and/or standing);Pain;Decreased activity tolerance;Decreased coordination      OT Treatment/Interventions: Self-care/ADL training;DME and/or AE instruction;Therapeutic activities;Patient/family education;Energy conservation    OT Goals(Current goals can be found in the care plan section) Acute Rehab OT Goals Patient Stated Goal: To return home. OT Goal Formulation: With patient Time For Goal Achievement: 11/07/20 Potential to Achieve Goals: Fair ADL Goals Pt Will Perform Eating: Independently;sitting Pt Will Perform Grooming: Independently;sitting Pt Will Perform Upper Body Bathing: with supervision;sitting Pt Will Perform Lower Body Bathing: with min assist;bed level Pt Will Perform Upper Body Dressing: with supervision;sitting Pt Will Perform Lower Body Dressing: with min assist;sitting/lateral leans Pt Will Transfer to Toilet: with mod assist;anterior/posterior transfer;bedside  commode Pt Will Perform Toileting - Clothing Manipulation and hygiene: with mod assist;sitting/lateral leans Additional ADL Goal #1: Patient will maintain static sitting balance at EOB with supervision A with and without unilateral UE support on bed surface in prep for seated grooming tasks.  OT Frequency: Min 2X/week   Barriers to D/C:            Co-evaluation              AM-PAC OT "6 Clicks" Daily Activity     Outcome Measure Help from another person eating meals?: None Help from another person taking care of personal grooming?: A Little Help from another person toileting, which includes using toliet, bedpan, or urinal?: A Lot Help from another person bathing (including washing, rinsing, drying)?: A Lot Help from another person to put on and taking off regular upper body clothing?: A Lot Help from another person to put on and taking off regular lower body clothing?: A Lot 6 Click Score: 15   End of Session Nurse Communication: Mobility status;Other (comment) (+2 assist for return to bed via AP transfer.)  Activity Tolerance: Patient tolerated treatment well;Patient limited by pain Patient left: in chair;with call bell/phone within reach;with chair alarm set  OT Visit Diagnosis: Other abnormalities of gait and mobility (R26.89);Muscle weakness (generalized) (M62.81);Pain Pain - Right/Left: Right Pain - part of body:  (Residual limb)  Time: 3795-5831 OT Time Calculation (min): 21 min Charges:  OT General Charges $OT Visit: 1 Visit OT Evaluation $OT Eval Moderate Complexity: 1 Mod  Theta Leaf H. OTR/L Supplemental OT, Department of rehab services [phone]  Hawthorne Day R H. 10/24/2020, 9:49 AM

## 2020-10-24 NOTE — Progress Notes (Signed)
Inpatient Rehab Admissions Coordinator:   CIR consult received. Therapy recommending SNF at this time. Current payor trends also indicate that Pt.'s [facility] Policy will not approve CIR for an AKA. I will not pursue for admission to CIR at this time. Please consult me with any questions.   Clemens Catholic, [place], [place] Admissions Coordinator  [phone] ([place]) [phone] (office)

## 2020-10-25 ENCOUNTER — Ambulatory Visit (INDEPENDENT_AMBULATORY_CARE_PROVIDER_SITE_OTHER): Payer: Medicare HMO | Admitting: Nurse Practitioner

## 2020-10-25 DIAGNOSIS — T8149XA Infection following a procedure, other surgical site, initial encounter: Secondary | ICD-10-CM | POA: Diagnosis not present

## 2020-10-25 LAB — GLUCOSE, CAPILLARY
Glucose-Capillary: 112 mg/dL — ABNORMAL HIGH (ref 70–99)
Glucose-Capillary: 124 mg/dL — ABNORMAL HIGH (ref 70–99)
Glucose-Capillary: 111 mg/dL — ABNORMAL HIGH (ref 70–99)
Glucose-Capillary: 134 mg/dL — ABNORMAL HIGH (ref 70–99)

## 2020-10-25 LAB — CBC
HCT: 21.9 % — ABNORMAL LOW (ref 39.0–52.0)
Hemoglobin: 6.3 g/dL — CL (ref 13.0–17.0)
MCH: 25.8 pg — ABNORMAL LOW (ref 26.0–34.0)
MCHC: 28.8 g/dL — ABNORMAL LOW (ref 30.0–36.0)
MCV: 89.8 fL (ref 80.0–100.0)
Platelets: 381 10*3/uL (ref 150–400)
RBC: 2.44 MIL/uL — ABNORMAL LOW (ref 4.22–5.81)
RDW: 23.1 % — ABNORMAL HIGH (ref 11.5–15.5)
WBC: 12.3 10*3/uL — ABNORMAL HIGH (ref 4.0–10.5)
nRBC: 0 % (ref 0.0–0.2)

## 2020-10-25 LAB — PREPARE RBC (CROSSMATCH)

## 2020-10-25 LAB — BASIC METABOLIC PANEL
Anion gap: 7 (ref 5–15)
BUN: 13 mg/dL (ref 8–23)
CO2: 23 mmol/L (ref 22–32)
Calcium: 7.6 mg/dL — ABNORMAL LOW (ref 8.9–10.3)
Chloride: 109 mmol/L (ref 98–111)
Creatinine, Ser: 0.49 mg/dL — ABNORMAL LOW (ref 0.61–1.24)
GFR, Estimated: >60 mL/min (ref >60)
Glucose, Bld: 110 mg/dL — ABNORMAL HIGH (ref 70–99)
Potassium: 3.8 mmol/L (ref 3.5–5.1)
Sodium: 139 mmol/L (ref 135–145)

## 2020-10-25 LAB — HEMOGLOBIN AND HEMATOCRIT, BLOOD
HCT: 28.4 % — ABNORMAL LOW (ref 39.0–52.0)
Hemoglobin: 9.1 g/dL — ABNORMAL LOW (ref 13.0–17.0)

## 2020-10-25 LAB — OCCULT BLOOD X 1 CARD TO LAB, STOOL: Fecal Occult Bld: POSITIVE — AB

## 2020-10-25 LAB — VANCOMYCIN, PEAK: Vancomycin Pk: 26 ug/mL — ABNORMAL LOW (ref 30–40)

## 2020-10-25 MED ORDER — FUROSEMIDE 10 MG/ML IJ SOLN
20.0000 mg | Freq: Once | INTRAMUSCULAR | Status: AC
  Administered 2020-10-25: 20 mg via INTRAVENOUS
  Filled 2020-10-25: qty 2

## 2020-10-25 MED ORDER — DIPHENHYDRAMINE HCL 25 MG PO CAPS
25.0000 mg | ORAL_CAPSULE | Freq: Once | ORAL | Status: AC
  Administered 2020-10-25: 25 mg via ORAL
  Filled 2020-10-25: qty 1

## 2020-10-25 MED ORDER — SODIUM CHLORIDE 0.9% IV SOLUTION: Freq: Once | INTRAVENOUS | Status: DC

## 2020-10-25 MED ORDER — SODIUM CHLORIDE 0.9 % IV SOLN
2.0000 g | Freq: Two times a day (BID) | INTRAVENOUS | Status: DC
  Administered 2020-10-25: 2 g via INTRAVENOUS
  Filled 2020-10-25: qty 2

## 2020-10-25 MED ORDER — ACETAMINOPHEN 325 MG PO TABS
650.0000 mg | ORAL_TABLET | Freq: Once | ORAL | Status: AC
  Administered 2020-10-25: 650 mg via ORAL
  Filled 2020-10-25: qty 2

## 2020-10-25 NOTE — Progress Notes (Signed)
Called by RN.  Pt with Hgb of 6.3 from 7.4 yesterday.  Transfuse 2 units PRBC. Order placed

## 2020-10-25 NOTE — Plan of Care (Signed)
  Problem: Elimination: Goal: Will not experience complications related to bowel motility Outcome: Completed/Met Goal: Will not experience complications related to urinary retention Outcome: Completed/Met

## 2020-10-25 NOTE — Progress Notes (Signed)
PHARMACY NOTE:  ANTIMICROBIAL RENAL DOSAGE ADJUSTMENT  Current antimicrobial regimen includes a mismatch between antimicrobial dosage and estimated renal function.  As per policy approved by the Pharmacy & Therapeutics and Medical Executive Committees, the antimicrobial dosage will be adjusted accordingly.  Current antimicrobial dosage:  cefepime 2gm IV q8h  Indication: wound infection, OM  Renal Function:  Estimated Creatinine Clearance: 56.5 mL/min (A) (by C-G formula based on SCr of 0.49 mg/dL (L)). []      On intermittent HD, scheduled: []      On CRRT    Antimicrobial dosage has been changed to:  Cefepime 2gm IV q12h  Additional comments:   Thomas Garcia A. Levada Dy, PharmD, BCPS, FNKF Clinical Pharmacist [place] Please utilize Amion for appropriate phone number to reach the unit pharmacist ([place])  10/25/2020 7:40 AM

## 2020-10-25 NOTE — Plan of Care (Signed)
  Problem: Clinical Measurements: Goal: Ability to maintain clinical measurements within normal limits will improve Outcome: Progressing   Problem: Pain Managment: Goal: General experience of comfort will improve Outcome: Progressing   Problem: Activity: Goal: Ability to perform//tolerate increased activity and mobilize with assistive devices will improve Outcome: Progressing   Problem: Clinical Measurements: Goal: Postoperative complications will be avoided or minimized Outcome: Progressing   Problem: Pain Management: Goal: Pain level will decrease with appropriate interventions Outcome: Progressing   Problem: Skin Integrity: Goal: Demonstration of wound healing without infection will improve Outcome: Progressing

## 2020-10-25 NOTE — Progress Notes (Signed)
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  The last BKA was done on 09/28/2020 by Dr. Carlis Abbott.  He was recovering well until about 2 weeks ago he noticed some drainage from the incision site.  Patient has been admitted with wound infection after recent surgery.  He was transferred to [facility] to be evaluated by vascular surgery.  Patient underwent above-knee amputation on 7/17.   Assessment & Plan:   Principal Problem:   Wound infection after surgery Active Problems:   Benign essential HTN   Essential hypertension   Diabetes mellitus type 2 in nonobese (HCC)   History of pulmonary embolism   Malignant neoplasm of upper lobe of right lung (HCC)  1-Wound infection after recent right BKA: -X ray: Soft tissue swelling and gas about the surgical site, status post below the knee a amputation. Findings suggest cellulitis and possible necrotizing fasciitis. No gross osseous destruction to confirm recurrent osteomyelitis. -Continue with IV vancomycin and Cefepime/ flagyl.  -Patient underwent AKA on 7/17. -Follow WBC trend.  Plan to conitnue antibiotics for another 24 hours per vascular.   2-Type 2 Diabetes: Continue to hold home metformin. Continue with correction insulin.   Hold Levemir due to hypoglycemia and poor oral intake.  Follow CBGs  3-history of PE: Noted to have a small peripheral PEs in the past and patient has declined anticoagulation previously.  Neoplasm of the right lung with metastatic disease: Need to follow-up with his oncologist  4-Anemia; iron deficiency Hb lower post surgery.  Started  ferrous sulfate  Hb down to 6. 2 units PRBC ordered.  Check occult blood.   Estimated body  mass index is 31.82 kg/m as calculated from the following:   Height as of this encounter: 4\' 9"  (1.448 m).   Weight as of this encounter: 66.7 kg.   DVT prophylaxis: Hold Heparin due to drop in hb.  Code Status: DNR Family Communication: Care discussed with patient Disposition Plan:  Status is: Inpatient  Remains inpatient appropriate because:IV treatments appropriate due to intensity of illness or inability to take PO  Dispo: The patient is from: Home              Anticipated d/c is to:  To be determined              Patient currently is not medically stable to d/c.   Difficult to place patient No        Consultants:  Dr. Carlis Abbott with vascular surgery  Procedures:  AKA 7/17  Antimicrobials:  Vancomycin and cefepime  Subjective: He is alert. No new complaints.  Hd down to 6.  He is on iron. Nurse report dark stool today, small amount.   Objective: Vitals:   10/24/20 1800 10/24/20 2319 10/25/20 0503 10/25/20 0508  BP: 110/60 (!) 120/47 (!) 98/56 (!) 124/48  Pulse: 80 73 72 71  Resp: 17 16  16   Temp: 98 F (36.7 C) 97.9 F (36.6 C) 98.8 F (37.1 C) 98 F (36.7 C)  TempSrc: Oral Oral  Oral  SpO2: 99% 93%  92%  Weight:    66.7 kg  Height:        Intake/Output Summary (Last 24 hours) at 10/25/2020 0809 Last data filed at 10/25/2020 0600  Gross per 24 hour  Intake 3438.54 ml  Output 550 ml  Net 2888.54 ml    Filed Weights   10/22/20 1337 10/22/20 2103 10/25/20 0508  Weight: 74.7 kg 74.7 kg 66.7 kg    Examination:  General exam: NAD Respiratory system: CTA Cardiovascular system: S 1, S 2 RRR Gastrointestinal system: BS present, soft,  nt Central nervous system: Alert Extremities. R AKA with dressing. Left BKA   Data Reviewed: I have personally reviewed following labs and imaging studies  CBC: Recent Labs  Lab 10/21/20 1237 10/22/20 0620 10/23/20 0433 10/24/20 1123 10/25/20 0413  WBC 16.5* 17.8* 15.8* 19.4* 12.3*  NEUTROABS 13.7*  --   --    --   --   HGB 9.3* 8.8* 8.5* 7.4* 6.3*  HCT 31.0* 29.5* 28.0* 24.5* 21.9*  MCV 87.8 87.3 86.4 86.6 89.8  PLT 660* 592* 565* 457* 702    Basic Metabolic Panel: Recent Labs  Lab 10/21/20 1237 10/22/20 0620 10/23/20 0433 10/24/20 0644 10/25/20 0413  NA 138 137 134* 137 139  K 3.9 4.0 4.0 4.7 3.8  CL 102 102 99 105 109  CO2 30 29 29 24 23   GLUCOSE 120* 76 97 114* 110*  BUN 13 11 12 12 13   CREATININE 0.46* 0.52* 0.66 0.61 0.49*  CALCIUM 8.8* 8.8* 8.4* 8.1* 7.6*  MG  --   --  1.9  --   --     GFR: Estimated Creatinine Clearance: 56.5 mL/min (A) (by C-G formula based on SCr of 0.49 mg/dL (L)). Liver Function Tests: Recent Labs  Lab 10/21/20 1237  AST 10*  ALT 9  ALKPHOS 97  BILITOT 0.4  PROT 6.6  ALBUMIN 2.8*    No results for input(s): LIPASE, AMYLASE in the last 168 hours. No results for input(s): AMMONIA in the last 168 hours. Coagulation Profile: No results for input(s): INR, PROTIME in the last 168 hours. Cardiac Enzymes: No results for input(s): CKTOTAL, CKMB, CKMBINDEX, TROPONINI in the last 168 hours. BNP (last 3 results) No results for input(s): PROBNP in the last 8760 hours. HbA1C: No results for input(s): HGBA1C in the last 72 hours. CBG: Recent Labs  Lab 10/24/20 0642 10/24/20 1131 10/24/20 1714 10/24/20 2139 10/25/20 0643  GLUCAP 129* 161* 152* 143* 112*    Lipid Profile: No results for input(s): CHOL, HDL, LDLCALC, TRIG, CHOLHDL, LDLDIRECT in the last 72 hours. Thyroid Function Tests: No results for input(s): TSH, T4TOTAL, FREET4, T3FREE, THYROIDAB in the last 72 hours. Anemia Panel: No results for input(s): VITAMINB12, FOLATE, FERRITIN, TIBC, IRON, RETICCTPCT in the last 72 hours. Sepsis Labs: No results for input(s): PROCALCITON, LATICACIDVEN in the last 168 hours.  Recent Results (from the past 240 hour(s))  Blood culture (routine x 2)     Status: None (Preliminary result)   Collection Time: 10/21/20 12:38 PM   Specimen: Left  Antecubital; Blood  Result Value Ref Range Status   Specimen Description   Final    LEFT ANTECUBITAL BOTTLES DRAWN AEROBIC AND ANAEROBIC Performed at [facility], [address] [place], [place] [address]    Special Requests   Final    Blood Culture adequate volume Performed at [facility], [address], [place], [place] [address]    Culture   Final    NO GROWTH 4 DAYS Performed at [facility], [place] [address] [place], [place] [address]    Report Status PENDING  Incomplete  Blood culture (routine x 2)     Status: None (  Preliminary result)   Collection Time: 10/21/20  1:52 PM   Specimen: Right Antecubital; Blood  Result Value Ref Range Status   Specimen Description   Final    RIGHT ANTECUBITAL BOTTLES DRAWN AEROBIC AND ANAEROBIC   Special Requests Blood Culture adequate volume  Final   Culture   Final    NO GROWTH 3 DAYS Performed at [facility], [address], [place], [place] [address]    Report Status PENDING  Incomplete  Surgical pcr screen     Status: Abnormal   Collection Time: 10/22/20 10:40 PM   Specimen: Nasal Mucosa; Nasal Swab  Result Value Ref Range Status   MRSA, PCR POSITIVE (A) NEGATIVE Final    Comment: RESULT CALLED TO, READ BACK BY AND VERIFIED WITH: E. CASTRO RN, AT [phone] 10/23/20 D. VANHOOK    Staphylococcus aureus POSITIVE (A) NEGATIVE Final    Comment: (NOTE) The Xpert SA Assay (FDA approved for NASAL specimens in patients 78 years of age and older), is one component of a comprehensive surveillance program. It is not intended to diagnose infection nor to guide or monitor treatment. Performed at [facility], [place] [address], [place], [place] [address]           Radiology Studies: No results found.      Scheduled Meds:  sodium chloride   Intravenous Once   acetaminophen  650 mg Oral Once   atorvastatin  10 mg Oral q1800   Chlorhexidine Gluconate Cloth  6 each Topical Q0600   diphenhydrAMINE  25 mg Oral Once   docusate  sodium  100 mg Oral Daily   feeding supplement  237 mL Oral BID BM   ferrous sulfate  324 mg Oral Q breakfast   furosemide  20 mg Intravenous Once   heparin  5,000 Units Subcutaneous Q8H   insulin aspart  0-9 Units Subcutaneous TID WC   levETIRAcetam  500 mg Oral BID   mupirocin ointment  1 application Nasal BID   nicotine  7 mg Transdermal Daily   nystatin  5 mL Oral QID   oxyCODONE  10 mg Oral Q12H   pantoprazole  40 mg Oral Q0600   Continuous Infusions:  ceFEPime (MAXIPIME) IV     lactated ringers 75 mL/hr at 10/25/20 0029   metronidazole 500 mg (10/25/20 0431)   vancomycin 1,250 mg (10/25/20 0515)     LOS: 4 days    Time spent: 35 minutes.     Elmarie Shiley, MD [facility]   If 7PM-7AM, please contact night-coverage [email or web address]  10/25/2020, 8:09 AM

## 2020-10-25 NOTE — Progress Notes (Addendum)
Progress Note    10/25/2020 7:14 AM 2 Days Post-Op  Subjective:  Sore   Vitals:   10/25/20 0503 10/25/20 0508  BP: (!) 98/56 (!) 124/48  Pulse: 72 71  Resp:  16  Temp: 98.8 F (37.1 C) 98 F (36.7 C)  SpO2:  92%    Physical Exam: General appearance: Awake, alert in no apparent distress Cardiac: Heart rate and rhythm are regular Respirations: Nonlabored Extremities: Amputation site incision is well approximated without bleeding or hematoma.  Anterior and posterior flaps are warm and well-perfused      CBC    Component Value Date/Time   WBC 12.3 (H) 10/25/2020 0413   RBC 2.44 (L) 10/25/2020 0413   HGB 6.3 (LL) 10/25/2020 0413   HCT 21.9 (L) 10/25/2020 0413   PLT 381 10/25/2020 0413   MCV 89.8 10/25/2020 0413   MCH 25.8 (L) 10/25/2020 0413   MCHC 28.8 (L) 10/25/2020 0413   RDW 23.1 (H) 10/25/2020 0413   LYMPHSABS 1.3 10/21/2020 1237   MONOABS 1.1 (H) 10/21/2020 1237   EOSABS 0.1 10/21/2020 1237   BASOSABS 0.1 10/21/2020 1237    BMET    Component Value Date/Time   NA 139 10/25/2020 0413   K 3.8 10/25/2020 0413   CL 109 10/25/2020 0413   CO2 23 10/25/2020 0413   GLUCOSE 110 (H) 10/25/2020 0413   BUN 13 10/25/2020 0413   CREATININE 0.49 (L) 10/25/2020 0413   CREATININE 0.60 (L) 09/21/2020 1426   CALCIUM 7.6 (L) 10/25/2020 0413   GFRNONAA >60 10/25/2020 0413   GFRNONAA 96 09/21/2020 1426   GFRAA 112 09/21/2020 1426     Intake/Output Summary (Last 24 hours) at 10/25/2020 0714 Last data filed at 10/25/2020 0600 Gross per 24 hour  Intake 3438.54 ml  Output 550 ml  Net 2888.54 ml    HOSPITAL MEDICATIONS Scheduled Meds:  sodium chloride   Intravenous Once   acetaminophen  650 mg Oral Once   atorvastatin  10 mg Oral q1800   Chlorhexidine Gluconate Cloth  6 each Topical Q0600   diphenhydrAMINE  25 mg Oral Once   docusate sodium  100 mg Oral Daily   feeding supplement  237 mL Oral BID BM   ferrous sulfate  324 mg Oral Q breakfast   furosemide  20  mg Intravenous Once   heparin  5,000 Units Subcutaneous Q8H   insulin aspart  0-9 Units Subcutaneous TID WC   levETIRAcetam  500 mg Oral BID   mupirocin ointment  1 application Nasal BID   nicotine  7 mg Transdermal Daily   nystatin  5 mL Oral QID   oxyCODONE  10 mg Oral Q12H   pantoprazole  40 mg Oral Q0600   Continuous Infusions:  ceFEPime (MAXIPIME) IV 2 g (10/25/20 0518)   lactated ringers 75 mL/hr at 10/25/20 0029   metronidazole 500 mg (10/25/20 0431)   vancomycin 1,250 mg (10/25/20 0515)   PRN Meds:.ketorolac, methocarbamol, ondansetron **OR** ondansetron (ZOFRAN) IV, oxyCODONE-acetaminophen, phenol  Assessment and Plan: POD 2 right above-the-knee amputation secondary to nonhealing necrotic right below-knee amputation Afebrile. Leukocytosis improving. Continue antibiotics another 24 hours. Follow-up in office in 4-6 weeks for staple removal.  -DVT prophylaxis:  heparin [place]   Risa Grill, PA-C [facility] [phone] 10/25/2020  7:14 AM   I have seen and evaluated the patient. I agree with the PA note as documented above.  Postop day 2 status post right above-knee amputation for necrotic nonhealing BKA.  Dressing removed this  morning and AKA looks good.  Discussed staples will stay about 3 to 4 weeks.  Marty Heck, MD [facility] [place] Office: [phone]

## 2020-10-25 NOTE — Progress Notes (Signed)
Results for DAMAR, PETIT "ED" (MRN [id number]) as of 10/25/2020 05:33  Ref. Range 10/25/2020 04:13  Hemoglobin Latest Ref Range: 13.0 - 17.0 g/dL 6.3 (LL)  MD on call paged. Awaiting return call.

## 2020-10-25 NOTE — Care Management Important Message (Signed)
Important Message  Patient Details  Name: CHARLEE WHITEBREAD MRN: [id number] Date of Birth: 1940-11-24   Medicare Important Message Given:  Yes     Ruel Dimmick P Laraine Samet 10/25/2020, 1:42 PM

## 2020-10-25 NOTE — TOC Progression Note (Signed)
Transition of Care [facility]) - Progression Note    Patient Details  Name: Thomas Garcia MRN: [id number] Date of Birth: [birth year]  Transition of [facility]) CM/SW Contact  [name], Renea Ee, RN Phone Number: 10/25/2020, 2:58 PM  Clinical Narrative:    Spoke with patient's brother about DME bed with overhead trapeze and sliding board that they will be getting from PCP. Brother called PCP's office in the presence of this Probation officer and was told confirmation went through on July 11th. Brother should call the company which is [employer] in [place] that will be delivering the equipments to arrange for delivery when patient is medically ready.    Expected Discharge Plan: [place] Barriers to Discharge: Continued Medical Work up  Expected Discharge Plan and Services Expected Discharge Plan: [place] In-house Referral: Clinical Social Work Discharge Planning Services: CM Consult Post Acute Care Choice: Home Health, Durable Medical Equipment Living arrangements for the past 2 months: [place]                 DME Arranged:  (Patient reported talking with his doctor who is getting a hospital bed  and transfer bench for himr) DME Agency: [facility] (Patient reported receving HH services through [place])                   Social Determinants of Health (SDOH) Interventions    Readmission Risk Interventions No flowsheet data found.

## 2020-10-26 DIAGNOSIS — T8149XA Infection following a procedure, other surgical site, initial encounter: Secondary | ICD-10-CM | POA: Diagnosis not present

## 2020-10-26 LAB — TYPE AND SCREEN
ABO/RH(D): A POS
Antibody Screen: NEGATIVE
Unit division: 0
Unit division: 0

## 2020-10-26 LAB — BPAM RBC
Blood Product Expiration Date: 202207252359
Blood Product Expiration Date: 202208112359
ISSUE DATE / TIME: 202207191223
ISSUE DATE / TIME: 202207191549
Unit Type and Rh: 6200
Unit Type and Rh: 6200

## 2020-10-26 LAB — CBC
HCT: 32 % — ABNORMAL LOW (ref 39.0–52.0)
Hemoglobin: 10.3 g/dL — ABNORMAL LOW (ref 13.0–17.0)
MCH: 27.5 pg (ref 26.0–34.0)
MCHC: 32.2 g/dL (ref 30.0–36.0)
MCV: 85.3 fL (ref 80.0–100.0)
Platelets: 447 10*3/uL — ABNORMAL HIGH (ref 150–400)
RBC: 3.75 MIL/uL — ABNORMAL LOW (ref 4.22–5.81)
RDW: 19.8 % — ABNORMAL HIGH (ref 11.5–15.5)
WBC: 12.3 10*3/uL — ABNORMAL HIGH (ref 4.0–10.5)
nRBC: 0 % (ref 0.0–0.2)

## 2020-10-26 LAB — VANCOMYCIN, TROUGH: Vancomycin Tr: 12 ug/mL — ABNORMAL LOW (ref 15–20)

## 2020-10-26 LAB — GLUCOSE, CAPILLARY
Glucose-Capillary: 127 mg/dL — ABNORMAL HIGH (ref 70–99)
Glucose-Capillary: 137 mg/dL — ABNORMAL HIGH (ref 70–99)
Glucose-Capillary: 125 mg/dL — ABNORMAL HIGH (ref 70–99)
Glucose-Capillary: 115 mg/dL — ABNORMAL HIGH (ref 70–99)

## 2020-10-26 LAB — CULTURE, BLOOD (ROUTINE X 2)
Culture: NO GROWTH
Culture: NO GROWTH
Special Requests: ADEQUATE
Special Requests: ADEQUATE

## 2020-10-26 LAB — BASIC METABOLIC PANEL
Anion gap: 5 (ref 5–15)
BUN: 12 mg/dL (ref 8–23)
CO2: 30 mmol/L (ref 22–32)
Calcium: 8 mg/dL — ABNORMAL LOW (ref 8.9–10.3)
Chloride: 102 mmol/L (ref 98–111)
Creatinine, Ser: 0.49 mg/dL — ABNORMAL LOW (ref 0.61–1.24)
GFR, Estimated: >60 mL/min (ref >60)
Glucose, Bld: 125 mg/dL — ABNORMAL HIGH (ref 70–99)
Potassium: 3.7 mmol/L (ref 3.5–5.1)
Sodium: 137 mmol/L (ref 135–145)

## 2020-10-26 LAB — SURGICAL PATHOLOGY

## 2020-10-26 NOTE — Progress Notes (Signed)
Occupational Therapy Treatment Patient Details Name: Thomas Garcia MRN: [id number] DOB: 1941-02-24 Today's Date: 10/26/2020    History of present illness 80 y.o. male presented to [facility] on 7/15 with nonhealing necrotic R BKA. Transferred to [facility] for revision s/p R AKA 7/17. PMHx significant for lung cancer with brain mets, L BKA, Hx of PE and DM with neuropathy.   OT comments  Patient met seated in recliner in agreement with OT treatment session. Patient fatigued from sitting in recliner for several hours. Questionable liquid in cup present on bedside table with patient reporting emesis x2. 3rd episode of emesis with OT in room. RN made aware. Patient required Max +2 for return to supine via [facility] transfer with cues for hand placement and increased time. Patient unable to tolerate further bedlevel there ex. OT will continue to follow acutely.    Follow Up Recommendations  SNF;Other (comment);Supervision/Assistance - 24 hour (Patient refusing SNF; recommend [facility] [place] services and 24hr supervision/assist)    Equipment Recommendations  Hospital bed    Recommendations for Other Services      Precautions / Restrictions Precautions Precautions: Fall Precaution Comments: R AKA and L BKA Restrictions Weight Bearing Restrictions: Yes RLE Weight Bearing: Non weight bearing LLE Weight Bearing: Non weight bearing       Mobility Bed Mobility Overal bed mobility: Needs Assistance Bed Mobility: Rolling Rolling: Supervision   Supine to sit: HOB elevated;Min guard     General bed mobility comments: Seated in recliner upon entry.    Transfers Overall transfer level: Needs assistance Equipment used: None Transfers: Comptroller transfers: Mod assist;+2 physical assistance;+2 safety/equipment   General transfer comment: Max A +2 for AP transfer from chair to bed with use of chuck pad secondary to nausea/vomiting/fatigue/pain.    Balance Overall  balance assessment: Needs assistance Sitting-balance support: Bilateral upper extremity supported Sitting balance-Leahy Scale: Fair Sitting balance - Comments: Min A to maintain static sitting at EOB with unilateral vs bilateral UE support on bed surface.       Standing balance comment: L AKA, R BKA                           ADL either performed or assessed with clinical judgement   ADL Overall ADL's : Needs assistance/impaired                                       General ADL Comments: Patient limited by pain in R residual limb.     Vision       Perception     Praxis      Cognition Arousal/Alertness: Lethargic Behavior During Therapy: WFL for tasks assessed/performed Overall Cognitive Status: Impaired/Different from baseline                               Problem Solving: Slow processing;Requires verbal cues;Requires tactile cues;Difficulty sequencing General Comments: pt requires increased time and cuing for mobility        Exercises Other Exercises Other Exercises: chair push ups x5   Shoulder Instructions       General Comments Patient reports emesis x2 with 3rd episode with OT in room. RN notified.    Pertinent Vitals/ Pain       Pain Assessment: Faces Faces Pain Scale: Hurts little  more Pain Location: R residual limb and abdomen Pain Descriptors / Indicators: Aching;Grimacing;Guarding;Sore Pain Intervention(s): Monitored during session  Home Living                                          Prior Functioning/Environment              Frequency  Min 2X/week        Progress Toward Goals  OT Goals(current goals can now be found in the care plan section)  Progress towards OT goals: Progressing toward goals  Acute Rehab OT Goals Patient Stated Goal: To return home. OT Goal Formulation: With patient Time For Goal Achievement: 11/07/20 Potential to Achieve Goals: Fair ADL Goals Pt  Will Perform Eating: Independently;sitting Pt Will Perform Grooming: Independently;sitting Pt Will Perform Upper Body Bathing: with supervision;sitting Pt Will Perform Lower Body Bathing: with min assist;bed level Pt Will Perform Upper Body Dressing: with supervision;sitting Pt Will Perform Lower Body Dressing: with min assist;sitting/lateral leans Pt Will Transfer to Toilet: with mod assist;anterior/posterior transfer;bedside commode Pt Will Perform Toileting - Clothing Manipulation and hygiene: with mod assist;sitting/lateral leans Additional ADL Goal #1: Patient will maintain static sitting balance at EOB with supervision A with and without unilateral UE support on bed surface in prep for seated grooming tasks.  Plan Discharge plan remains appropriate;Frequency remains appropriate    Co-evaluation                 AM-PAC OT "6 Clicks" Daily Activity     Outcome Measure   Help from another person eating meals?: None Help from another person taking care of personal grooming?: A Little Help from another person toileting, which includes using toliet, bedpan, or urinal?: A Lot Help from another person bathing (including washing, rinsing, drying)?: A Lot Help from another person to put on and taking off regular upper body clothing?: A Lot Help from another person to put on and taking off regular lower body clothing?: A Lot 6 Click Score: 15    End of Session    OT Visit Diagnosis: Other abnormalities of gait and mobility (R26.89);Muscle weakness (generalized) (M62.81);Pain Pain - Right/Left: Right Pain - part of body: Leg   Activity Tolerance Patient limited by fatigue;Patient limited by lethargy;Patient limited by pain   Patient Left with call bell/phone within reach;in bed;with bed alarm set   Nurse Communication Mobility status;Other (comment) (Patient reports emesis x3 and did not tell RN)        Time: 4259-5638 OT Time Calculation (min): 14 min  Charges: OT General  Charges $OT Visit: 1 Visit OT Treatments $Therapeutic Activity: 8-22 mins  Ambrie Carte H. OTR/L Supplemental OT, Department of rehab services [phone]   Teola Bradley 10/26/2020, 12:44 PM

## 2020-10-26 NOTE — Progress Notes (Signed)
Patient had a large black tarry stools.

## 2020-10-26 NOTE — Progress Notes (Signed)
Pharmacy Antibiotic Note  Thomas Garcia is a 80 y.o. male admitted on 10/21/2020 with wound infection.  Pharmacy has been consulted for Vancomycin and Cefepime dosing. Received initial Vanc/Zosyn doses in ED.  Vancomycin peak 26, trough 12 today. Per Vascular can d/c abx at this point. D/W Dr. Avon Gully. Will d/c.     Plan: Stop Cefepime  Stop Flagyl  Stop Vancomycin    Height: 4\' 9"  (144.8 cm) Weight: 66.7 kg (147 lb 0.8 oz) IBW/kg (Calculated) : 43.1  Temp (24hrs), Avg:98.1 F (36.7 C), Min:97.6 F (36.4 C), Max:98.4 F (36.9 C)  Recent Labs  Lab 10/22/20 0620 10/23/20 0433 10/24/20 0644 10/24/20 1123 10/25/20 0413 10/25/20 1026 10/26/20 0556  WBC 17.8* 15.8*  --  19.4* 12.3*  --  12.3*  CREATININE 0.52* 0.66 0.61  --  0.49*  --  0.49*  VANCOTROUGH  --   --   --   --   --   --  12*  VANCOPEAK  --   --   --   --   --  26*  --      Estimated Creatinine Clearance: 56.5 mL/min (A) (by C-G formula based on SCr of 0.49 mg/dL (L)).    No Known Allergies  Antimicrobials this admission: 7/15 Zosyn >> 7/18 7/15 Vancomycin >> 7/20 7/18 Cefepime >>7/20 7/18 Flagyl >>7/20   Microbiology results:  BCx: NG x3d   MRSA PCR: pos  Thomas Garcia A. Levada Dy, PharmD, BCPS, FNKF Clinical Pharmacist [place] Please utilize Amion for appropriate phone number to reach the unit pharmacist ([place])

## 2020-10-26 NOTE — Progress Notes (Signed)
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  The last BKA was done on 09/28/2020 by Dr. Carlis Abbott.  He was recovering well until about 2 weeks ago he noticed some drainage from the incision site.  Patient has been admitted with wound infection after recent surgery.  He was transferred to [facility] to be evaluated by vascular surgery.  Patient underwent above-knee amputation on 7/17.   Assessment & Plan:   Principal Problem:   Wound infection after surgery Active Problems:   Benign essential HTN   Essential hypertension   Diabetes mellitus type 2 in nonobese (HCC)   History of pulmonary embolism   Malignant neoplasm of upper lobe of right lung (HCC)  Sepsis secondary to necrotic nonhealing BKA - questionable concurrent infection, POA: -X ray: Soft tissue swelling and gas about the surgical site, status post below the knee a amputation. Findings suggest cellulitis and possible necrotizing fasciitis. No gross osseous destruction to confirm recurrent osteomyelitis. -Discontinue IV vancomycin,Cefepime, flagyl -per vascular team -Patient underwent subsequent AKA on 7/17 - staple removal in 3-4 weeks. -Follow WBC trend.    Type 2 Diabetes: Continue to hold home metformin. Continue with correction insulin.   Hold Levemir due to hypoglycemia and poor oral intake.  Follow CBGs   Hstory of PE: Noted to have a small peripheral PEs in the past and patient has declined anticoagulation previously.   Neoplasm of the right lung with metastatic disease: Need to follow-up with his oncologist   Anemia; likely chronic iron deficiency, POA Rule out concurrent acute blood loss anemia  - FOBT incidentally positive, black  stool noted, will discuss with GI for formal consult versus sideline pending repeat hemoglobin (currently 10.3 and rising status post transfusion 48 hours ago) - Status post 1 unit PRBC postoperatively   Obesity Estimated body mass index is 31.82 kg/m as calculated from the following: - Height as of this encounter: 4\' 9"  (1.448 m). - Weight as of this encounter: 66.7 kg.     DVT prophylaxis: Hold Heparin due to drop in hb/black stool Code Status: DNR Family Communication: Care discussed with patient  Status is: Inpatient  Dispo: The patient is from: Home              Anticipated d/c is to: Home              Anticipated d/c date is: 48 to 72 hours              Patient currently not medically stable for discharge  Consultants:  Vascular surgery  Procedures:  Right AKA 10/23/2020  Antimicrobials:  Discontinued  Subjective: No acute issues or events overnight denies nausea vomiting diarrhea constipation headache fevers or chills  Objective: Vitals:   10/25/20 1622 10/25/20 1852 10/25/20 2211 10/26/20 0510  BP: 123/66 (!) 142/55 (!) 130/53 (!) 149/59  Pulse: 88 74 76 73  Resp: 16 18 16 18   Temp: 98.4 F (36.9 C) 98.2 F (36.8 C) 97.6 F (36.4 C) 98.2 F (36.8 C)  TempSrc: Oral Oral Oral   SpO2: 98% 96% 96% 98%  Weight:      Height:        Intake/Output Summary (Last 24 hours) at 10/26/2020 0805 Last data filed at  10/26/2020 0602 Gross per 24 hour  Intake 3006.7 ml  Output 2000 ml  Net 1006.7 ml   Filed Weights   10/22/20 1337 10/22/20 2103 10/25/20 0508  Weight: 74.7 kg 74.7 kg 66.7 kg    Examination:  General:  Pleasantly resting in bed, No acute distress. HEENT:  Normocephalic atraumatic.  Sclerae nonicteric, noninjected.  Extraocular movements intact bilaterally. Neck:  Without mass or deformity.  Trachea is midline. Lungs:  Clear to auscultate bilaterally without rhonchi, wheeze, or rales. Heart:  Regular rate and rhythm.  Without murmurs, rubs, or  gallops. Abdomen:  Soft, nontender, nondistended.  Without guarding or rebound. Extremities: Right AKA dressing clean dry intact.  Chronic left BKA with postsurgical changes. Skin:  Warm and dry, no erythema, no ulcerations.   Data Reviewed: I have personally reviewed following labs and imaging studies  CBC: Recent Labs  Lab 10/21/20 1237 10/22/20 0620 10/23/20 0433 10/24/20 1123 10/25/20 0413 10/25/20 2003 10/26/20 0556  WBC 16.5* 17.8* 15.8* 19.4* 12.3*  --  12.3*  NEUTROABS 13.7*  --   --   --   --   --   --   HGB 9.3* 8.8* 8.5* 7.4* 6.3* 9.1* 10.3*  HCT 31.0* 29.5* 28.0* 24.5* 21.9* 28.4* 32.0*  MCV 87.8 87.3 86.4 86.6 89.8  --  85.3  PLT 660* 592* 565* 457* 381  --  270*   Basic Metabolic Panel: Recent Labs  Lab 10/22/20 0620 10/23/20 0433 10/24/20 0644 10/25/20 0413 10/26/20 0556  NA 137 134* 137 139 137  K 4.0 4.0 4.7 3.8 3.7  CL 102 99 105 109 102  CO2 29 29 24 23 30   GLUCOSE 76 97 114* 110* 125*  BUN 11 12 12 13 12   CREATININE 0.52* 0.66 0.61 0.49* 0.49*  CALCIUM 8.8* 8.4* 8.1* 7.6* 8.0*  MG  --  1.9  --   --   --    GFR: Estimated Creatinine Clearance: 56.5 mL/min (A) (by C-G formula based on SCr of 0.49 mg/dL (L)). Liver Function Tests: Recent Labs  Lab 10/21/20 1237  AST 10*  ALT 9  ALKPHOS 97  BILITOT 0.4  PROT 6.6  ALBUMIN 2.8*   No results for input(s): LIPASE, AMYLASE in the last 168 hours. No results for input(s): AMMONIA in the last 168 hours. Coagulation Profile: No results for input(s): INR, PROTIME in the last 168 hours. Cardiac Enzymes: No results for input(s): CKTOTAL, CKMB, CKMBINDEX, TROPONINI in the last 168 hours. BNP (last 3 results) No results for input(s): PROBNP in the last 8760 hours. HbA1C: No results for input(s): HGBA1C in the last 72 hours. CBG: Recent Labs  Lab 10/25/20 0643 10/25/20 1305 10/25/20 1648 10/25/20 2208 10/26/20 0704  GLUCAP 112* 124* 111* 134* 127*   Lipid Profile: No results for input(s):  CHOL, HDL, LDLCALC, TRIG, CHOLHDL, LDLDIRECT in the last 72 hours. Thyroid Function Tests: No results for input(s): TSH, T4TOTAL, FREET4, T3FREE, THYROIDAB in the last 72 hours. Anemia Panel: No results for input(s): VITAMINB12, FOLATE, FERRITIN, TIBC, IRON, RETICCTPCT in the last 72 hours. Sepsis Labs: No results for input(s): PROCALCITON, LATICACIDVEN in the last 168 hours.  Recent Results (from the past 240 hour(s))  Blood culture (routine x 2)     Status: None   Collection Time: 10/21/20 12:38 PM   Specimen: Left Antecubital; Blood  Result Value Ref Range Status   Specimen Description   Final    LEFT ANTECUBITAL BOTTLES DRAWN AEROBIC AND ANAEROBIC   Special Requests Blood Culture  adequate volume  Final   Culture   Final    NO GROWTH 5 DAYS Performed at [facility], [address] [place], [place] [address]    Report Status 10/26/2020 FINAL  Final  Blood culture (routine x 2)     Status: None   Collection Time: 10/21/20  1:52 PM   Specimen: Right Antecubital; Blood  Result Value Ref Range Status   Specimen Description   Final    RIGHT ANTECUBITAL BOTTLES DRAWN AEROBIC AND ANAEROBIC   Special Requests Blood Culture adequate volume  Final   Culture   Final    NO GROWTH 5 DAYS Performed at [facility], [address], [place], [place] [address]    Report Status 10/26/2020 FINAL  Final  Surgical pcr screen     Status: Abnormal   Collection Time: 10/22/20 10:40 PM   Specimen: Nasal Mucosa; Nasal Swab  Result Value Ref Range Status   MRSA, PCR POSITIVE (A) NEGATIVE Final    Comment: RESULT CALLED TO, READ BACK BY AND VERIFIED WITH: E. CASTRO RN, AT [phone] 10/23/20 D. VANHOOK    Staphylococcus aureus POSITIVE (A) NEGATIVE Final    Comment: (NOTE) The Xpert SA Assay (FDA approved for NASAL specimens in patients 48 years of age and older), is one component of a comprehensive surveillance program. It is not intended to diagnose infection nor to guide or monitor  treatment. Performed at [facility], [place] [address], [place], [place] [address]          Radiology Studies: No results found.      Scheduled Meds:  sodium chloride   Intravenous Once   atorvastatin  10 mg Oral q1800   Chlorhexidine Gluconate Cloth  6 each Topical Q0600   docusate sodium  100 mg Oral Daily   feeding supplement  237 mL Oral BID BM   ferrous sulfate  324 mg Oral Q breakfast   insulin aspart  0-9 Units Subcutaneous TID WC   levETIRAcetam  500 mg Oral BID   mupirocin ointment  1 application Nasal BID   nicotine  7 mg Transdermal Daily   nystatin  5 mL Oral QID   oxyCODONE  10 mg Oral Q12H   pantoprazole  40 mg Oral Q0600   Continuous Infusions:  ceFEPime (MAXIPIME) IV 2 g (10/25/20 2140)   lactated ringers 75 mL/hr at 10/26/20 0600   metronidazole 500 mg (10/26/20 0602)   vancomycin 1,250 mg (10/25/20 0515)    LOS: 5 days   Time spent: 41min  Xin Klawitter C Ronelle Michie, DO [facility]  If 7PM-7AM, please contact night-coverage [email or web address]  10/26/2020, 8:05 AM

## 2020-10-26 NOTE — Progress Notes (Signed)
Physical Therapy Treatment Patient Details Name: Thomas Garcia MRN: [id number] DOB: Mar 07, 1941 Today's Date: 10/26/2020    History of Present Illness 80 y.o. male presented to [facility] on 7/15 with nonhealing necrotic R BKA. Transferred to [facility] for revision s/p R AKA 7/17. PMHx significant for lung cancer with brain mets, L BKA, Hx of PE and DM with neuropathy.    PT Comments    Pt supine in bed on entry, initially reluctant to work with therapy but pleasantly surprised that PT was not as painful as he expected today. Pt found to be incontinent of loose black stool. Pt is able to perform rolling with supervision to get cleaned up. Pt is making good progress towards his goals, needing min Ax2 for pivoting hips in bed and modAx2 for [facility] transfer to recliner. Pt reports that despite PT recommendations that he will be returning to his brother's home with hospital bed. PT will work in next session to transfer to [facility]. Pt will likely need PTAR for transport home as car transfers will be very difficult.    Follow Up Recommendations  SNF     Equipment Recommendations  Hospital bed       Precautions / Restrictions Precautions Precautions: Fall Precaution Comments: R AKA and L BKA Restrictions Weight Bearing Restrictions: Yes RLE Weight Bearing: Non weight bearing LLE Weight Bearing: Non weight bearing    Mobility  Bed Mobility Overal bed mobility: Needs Assistance Bed Mobility: Rolling Rolling: Supervision   Supine to sit: HOB elevated;Min guard     General bed mobility comments: Able to transfer from supine to long sitting with use of bilateral rails and min guard, requires minAx2 for pivoting in the bed to set up for AP transfer    Transfers Overall transfer level: Needs assistance Equipment used: None Transfers: Comptroller transfers: Mod assist;+2 physical assistance;+2 safety/equipment   General transfer comment: modAx2 for AP transfer  from bed to recliner cues for offloading use of UE at hips to push up for facilitation of posterior hip scoot and then for use of arms of recliner for increase height to scoot from bed to chair      Balance Overall balance assessment: Needs assistance Sitting-balance support: Bilateral upper extremity supported Sitting balance-Leahy Scale: Fair Sitting balance - Comments: Min A to maintain static sitting at EOB with unilateral vs bilateral UE support on bed surface.       Standing balance comment: L AKA, R BKA                            Cognition Arousal/Alertness: Awake/alert Behavior During Therapy: WFL for tasks assessed/performed Overall Cognitive Status: Impaired/Different from baseline                               Problem Solving: Slow processing;Requires verbal cues;Requires tactile cues;Difficulty sequencing General Comments: pt requires increased time and cuing for mobility      Exercises Other Exercises Other Exercises: chair push ups x5    General Comments  VSS on RA       Pertinent Vitals/Pain Pain Assessment: Faces Faces Pain Scale: Hurts little more Pain Location: R residual limb Pain Descriptors / Indicators: Aching;Grimacing;Guarding;Sore     PT Goals (current goals can now be found in the care plan section) Acute Rehab PT Goals Patient Stated Goal: To return home. PT Goal Formulation:  With patient Time For Goal Achievement: 11/07/20 Potential to Achieve Goals: Poor Progress towards PT goals: Progressing toward goals    Frequency    Min 3X/week      PT Plan Current plan remains appropriate       AM-PAC PT "6 Clicks" Mobility   Outcome Measure  Help needed turning from your back to your side while in a flat bed without using bedrails?: None Help needed moving from lying on your back to sitting on the side of a flat bed without using bedrails?: A Little Help needed moving to and from a bed to a chair (including a  wheelchair)?: Total Help needed standing up from a chair using your arms (e.g., wheelchair or bedside chair)?: Total Help needed to walk in hospital room?: Total Help needed climbing 3-5 steps with a railing? : Total 6 Click Score: 11    End of Session   Activity Tolerance: Patient tolerated treatment well Patient left: in chair;with call bell/phone within reach;with nursing/sitter in room;with chair alarm set Nurse Communication: Mobility status PT Visit Diagnosis: Other abnormalities of gait and mobility (R26.89);Muscle weakness (generalized) (M62.81);Pain Pain - Right/Left: Right Pain - part of body: Leg     Time: 0100-7121 PT Time Calculation (min) (ACUTE ONLY): 26 min  Charges:  $Therapeutic Activity: 23-37 mins                     Leaira Fullam B. Migdalia Dk PT, DPT Acute Rehabilitation Services Pager [phone] Office [phone]    [place] 10/26/2020, 10:10 AM

## 2020-10-27 DIAGNOSIS — D649 Anemia, unspecified: Secondary | ICD-10-CM | POA: Diagnosis not present

## 2020-10-27 DIAGNOSIS — T8149XA Infection following a procedure, other surgical site, initial encounter: Secondary | ICD-10-CM | POA: Diagnosis not present

## 2020-10-27 DIAGNOSIS — R195 Other fecal abnormalities: Secondary | ICD-10-CM

## 2020-10-27 LAB — BASIC METABOLIC PANEL
Anion gap: 4 — ABNORMAL LOW (ref 5–15)
BUN: 8 mg/dL (ref 8–23)
CO2: 28 mmol/L (ref 22–32)
Calcium: 8.1 mg/dL — ABNORMAL LOW (ref 8.9–10.3)
Chloride: 105 mmol/L (ref 98–111)
Creatinine, Ser: 0.45 mg/dL — ABNORMAL LOW (ref 0.61–1.24)
GFR, Estimated: >60 mL/min (ref >60)
Glucose, Bld: 102 mg/dL — ABNORMAL HIGH (ref 70–99)
Potassium: 3.6 mmol/L (ref 3.5–5.1)
Sodium: 137 mmol/L (ref 135–145)

## 2020-10-27 LAB — CBC
HCT: 28.6 % — ABNORMAL LOW (ref 39.0–52.0)
Hemoglobin: 9 g/dL — ABNORMAL LOW (ref 13.0–17.0)
MCH: 27.3 pg (ref 26.0–34.0)
MCHC: 31.5 g/dL (ref 30.0–36.0)
MCV: 86.7 fL (ref 80.0–100.0)
Platelets: 428 10*3/uL — ABNORMAL HIGH (ref 150–400)
RBC: 3.3 MIL/uL — ABNORMAL LOW (ref 4.22–5.81)
RDW: 19.7 % — ABNORMAL HIGH (ref 11.5–15.5)
WBC: 13.3 10*3/uL — ABNORMAL HIGH (ref 4.0–10.5)
nRBC: 0 % (ref 0.0–0.2)

## 2020-10-27 LAB — GLUCOSE, CAPILLARY
Glucose-Capillary: 109 mg/dL — ABNORMAL HIGH (ref 70–99)
Glucose-Capillary: 120 mg/dL — ABNORMAL HIGH (ref 70–99)
Glucose-Capillary: 152 mg/dL — ABNORMAL HIGH (ref 70–99)
Glucose-Capillary: 119 mg/dL — ABNORMAL HIGH (ref 70–99)

## 2020-10-27 NOTE — Consult Note (Signed)
Referring Provider: Dr. Holli Humbles Primary Care Physician:  Doree Albee, MD Primary Gastroenterologist: Althia Forts  Reason for Consultation: Anemia, melena   HPI: Thomas Garcia is a 80 y.o. 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   During his 09/28/2020 hospitalization he had significant anemia with a hemoglobin level of 5.5 and he was transfused 3 units of PRBCs and received IV iron.  FOBT was positive.  He was evaluated by our GI service on 09/25/2020 and an EGD and colonoscopy were recommended however, he declined any endoscopic evaluation.  He was readmitted to the hospital 10/21/2020 after his home health nurse assessed his right BKA wound had foul-smelling purulent drainage.  He presented to [facility] for further evaluation then transferred to [facility] for vascular surgery evaluation. He underwent right above the knee amputation on 10/23/2020.   He passed 2 soft to loose black stools yesterday and one similar black stool this am. FOBT was positive. He is on Ferrous Sulfate $RemoveBeforeD'325mg'[id number]$  QD.  He is not on Plavix. Admission Hg level 9.3. Hg level was 7.4 on 7/18 then dropped down to 6.3 on 7/19.  He received 2 units of PRBCs. Post transfusion Hg 9.1 -> Hg 10.3 on 7/20 -> today Hg 9.0. A GI consult was requested for further evaluation.  He denies having any nausea or vomiting.  No dysphagia or heartburn.  No upper or lower abdominal pain.  His appetite is good.  At the SNF,  he reports passing normal brown-colored soft stools. He denies seeing any black stools.  No bright red rectal bleeding.  He denies ever undergoing  an EGD or colonoscopy.   Past Medical History:  Diagnosis Date   Diabetes mellitus without complication ([place])    Kidney stones    Lung cancer ([place]) 2022   PONV (postoperative nausea and vomiting)     Past Surgical History:  Procedure Laterality Date   ABDOMINAL AORTOGRAM W/LOWER EXTREMITY N/A 09/17/2018   Procedure: ABDOMINAL AORTOGRAM W/LOWER EXTREMITY;  Surgeon: Marty Heck, MD;  Location: [place] [facility];  Service: Cardiovascular;  Laterality: N/A;   AMPUTATION Left 09/22/2018   Procedure: AMPUTATION BELOW KNEE LEFT;  Surgeon: Marty Heck, MD;  Location: [place];  Service: Vascular;  Laterality: Left;   AMPUTATION Right 09/28/2020   Procedure: RIGHT BELOW KNEE AMPUTATION;  Surgeon: Marty Heck, MD;  Location: [place];  Service: Vascular;  Laterality: Right;   AMPUTATION Right 10/23/2020   Procedure: REVISION OF RIGHT  ABOVE KNEE AMPUTATION;  Surgeon: Marty Heck, MD;  Location: [place];  Service: Vascular;  Laterality: Right;   AMPUTATION TOE Left 09/10/2018   Procedure: AMPUTATION OF THIRD TOE LEFT FOOT, DEBRIDEMENT OF ULCERS ON SECOND TOE AND PARTIAL AMPUTATION SECOND TOE ON LEFT FOOT;  Surgeon: Virl Cagey, MD;  Location: [facility];  Service: General;  Laterality: Left;   APPLICATION OF CRANIAL NAVIGATION N/A 07/08/2020   Procedure: APPLICATION OF CRANIAL NAVIGATION;  Surgeon: Karsten Ro, DO;  Location: [place];  Service: Neurosurgery;  Laterality: N/A;   APPLICATION OF WOUND VAC Left 09/18/2018   Procedure: APPLICATION OF WOUND VAC;  Surgeon: Marty Heck,  MD;  Location: [place] [facility];  Service: Vascular;  Laterality: Left;   CRANIOTOMY Right 07/08/2020   Procedure: Right Occipital craniotomy for tumor resection with brainlab;  Surgeon: Dawley, Theodoro Doing, DO;  Location: [place];  Service: Neurosurgery;  Laterality: Right;   CYSTOSCOPY  05/08/2012   Procedure: CYSTOSCOPY;  Surgeon: Marissa Nestle, MD;  Location: [facility];  Service: Urology;  Laterality: N/A;   Evacuation of clots   IR IVC FILTER PLMT / S&I /IMG GUID/MOD SED  07/05/2020   KIDNEY SURGERY     sutures    PERIPHERAL VASCULAR BALLOON ANGIOPLASTY  09/17/2018   Procedure: PERIPHERAL VASCULAR BALLOON ANGIOPLASTY;  Surgeon: Marty Heck, MD;  Location: [place] [facility];  Service: Cardiovascular;;  LT SFA and AT   RADIOLOGY WITH ANESTHESIA N/A 06/30/2020   Procedure: MRI WITH ANESTHESIA   BRAIN WITH AND WITHOUT CONTRAST;  Surgeon: Radiologist, Medication, MD;  Location: [place];  Service: Radiology;  Laterality: N/A;   SPLENECTOMY     TRANSMETATARSAL AMPUTATION Left 09/18/2018   Procedure: TRANSMETATARSAL AMPUTATION LEFT FOOT;  Surgeon: Marty Heck, MD;  Location: [place];  Service: Vascular;  Laterality: Left;   TRANSURETHRAL RESECTION OF PROSTATE  05/14/2012   Procedure: TRANSURETHRAL RESECTION OF THE PROSTATE (TURP);  Surgeon: Marissa Nestle, MD;  Location: [facility];  Service: Urology;  Laterality: N/A;    Prior to Admission medications   Medication Sig Start Date End Date Taking? Authorizing Provider  acetaminophen (TYLENOL) 325 MG tablet Take 2 tablets (650 mg total) by mouth every 4 (four) hours as needed for headache or mild pain. Patient taking differently: Take 650 mg by mouth daily. 09/29/18  Yes Angiulli, Lavon Paganini, PA-C  Cal Carb-Mag Hydrox-Simeth (ROLAIDS ADVANCED) 1000-200-40 MG CHEW Chew 1 tablet by mouth daily as needed (for indigestion).   Yes [provider]  clopidogrel (PLAVIX) 75 MG tablet Take 1 tablet (75 mg total) by mouth daily. 07/23/20  Yes Lavina Hamman, MD  ferrous sulfate 324 MG TBEC Take 324 mg by mouth daily with breakfast.   Yes [provider]  levETIRAcetam (KEPPRA) 500 MG tablet Take 1 tablet (500 mg total) by mouth 2 (two) times daily. 10/03/20 01/01/21 Yes [place] Eric J, DO  metFORMIN (GLUCOPHAGE) 500 MG tablet Take 1 tablet (500 mg total) by mouth 2 (two) times daily with a meal. 10/03/20 01/01/21 Yes [place] Eric J, DO   methocarbamol (ROBAXIN) 500 MG tablet Take 1 tablet (500 mg total) by mouth every 6 (six) hours as needed for muscle spasms. 10/03/20 11/02/20 Yes [place] Donnamarie Poag, DO  NARCAN 4 MG/0.1ML LIQD nasal spray kit SMARTSIG:Spray(s) Both Nares 10/06/20  Yes [provider]  oxyCODONE (OXYCONTIN) 10 mg 12 hr tablet Take 1 tablet (10 mg total) by mouth every 12 (twelve) hours. 10/19/20  Yes Gosrani, Nimish C, MD  oxyCODONE-acetaminophen (PERCOCET) 10-325 MG tablet Take 1 tablet by mouth every 4 (four) hours as needed. 10/06/20  Yes [provider]  pantoprazole (PROTONIX) 40 MG tablet Take 1 tablet (40 mg total) by mouth daily at 6 (six) AM. 07/14/20  Yes Lavina Hamman, MD  atorvastatin (LIPITOR) 10 MG tablet Take 1 tablet (10 mg total) by mouth daily at 6 PM. 04/14/20   Anastasio Champion, Nimish C, MD  loperamide (IMODIUM) 2 MG capsule Take 1 capsule (2 mg total) by mouth as needed for diarrhea or loose stools. Patient not taking: Reported on 10/19/2020 09/21/20   Doree Albee, MD  traZODone (DESYREL) 50 MG  tablet Take 1 tablet (50 mg total) by mouth at bedtime. Patient not taking: Reported on 10/21/2020 04/14/20   Doree Albee, MD    Current Facility-Administered Medications  Medication Dose Route Frequency Provider Last Rate Last Admin   0.9 %  sodium chloride infusion (Manually program via Guardrails IV Fluids)   Intravenous Once Chotiner, Yevonne Aline, MD       atorvastatin (LIPITOR) tablet 10 mg  10 mg Oral q1800 Rhyne, Hulen Shouts, PA-C   10 mg at 10/26/20 1857   docusate sodium (COLACE) capsule 100 mg  100 mg Oral Daily Rhyne, Samantha J, PA-C   100 mg at 10/27/20 1145   feeding supplement (ENSURE ENLIVE / ENSURE PLUS) liquid 237 mL  237 mL Oral BID BM Rhyne, Samantha J, PA-C   237 mL at 10/27/20 1146   ferrous sulfate tablet 324 mg  324 mg Oral Q breakfast Gabriel Earing, PA-C   324 mg at 10/27/20 1146   insulin aspart (novoLOG) injection 0-9 Units  0-9 Units Subcutaneous TID WC Rhyne,  Hulen Shouts, PA-C   1 Units at 10/26/20 1736   levETIRAcetam (KEPPRA) tablet 500 mg  500 mg Oral BID Gabriel Earing, PA-C   500 mg at 10/27/20 1145   methocarbamol (ROBAXIN) tablet 500 mg  500 mg Oral Q6H PRN Gabriel Earing, PA-C   500 mg at 10/25/20 2137   mupirocin ointment (BACTROBAN) 2 % 1 application  1 application Nasal BID Gabriel Earing, PA-C   1 application at 02/40/97 1147   nicotine (NICODERM CQ - dosed in mg/24 hr) patch 7 mg  7 mg Transdermal Daily Rhyne, Samantha J, PA-C   7 mg at 10/27/20 1148   nystatin (MYCOSTATIN) 100000 UNIT/ML suspension 500,000 Units  5 mL Oral QID Regalado, Belkys A, MD   500,000 Units at 10/27/20 1148   ondansetron (ZOFRAN) tablet 4 mg  4 mg Oral Q6H PRN Rhyne, Samantha J, PA-C       Or   ondansetron (ZOFRAN) injection 4 mg  4 mg Intravenous Q6H PRN Rhyne, Samantha J, PA-C   4 mg at 10/26/20 3532   oxyCODONE (OXYCONTIN) 12 hr tablet 10 mg  10 mg Oral Q12H Rhyne, Samantha J, PA-C   10 mg at 10/27/20 1145   oxyCODONE-acetaminophen (PERCOCET/ROXICET) 5-325 MG per tablet 2 tablet  2 tablet Oral Q4H PRN Gabriel Earing, PA-C   2 tablet at 10/27/20 0502   pantoprazole (PROTONIX) EC tablet 40 mg  40 mg Oral Q0600 Rhyne, Samantha J, PA-C   40 mg at 10/27/20 0503   phenol (CHLORASEPTIC) mouth spray 1 spray  1 spray Mouth/Throat PRN Rhyne, Samantha J, PA-C        Allergies as of 10/21/2020   (No Known Allergies)    Family History  Problem Relation Age of Onset   Diabetes Mother     Social History   Socioeconomic History   Marital status: Legally Separated    Spouse name: Not on file   Number of children: Not on file   Years of education: Not on file   Highest education level: Not on file  Occupational History   Not on file  Tobacco Use   Smoking status: Former    Packs/day: 0.50    Years: 45.00    Pack years: 22.50    Types: Cigarettes   Smokeless tobacco: Never   Tobacco comments:    4 cigarettes per day  Vaping Use   Vaping Use:  Never  used  Substance and Sexual Activity   Alcohol use: No   Drug use: No   Sexual activity: Not on file  Other Topics Concern   Not on file  Social History Narrative   Divorced since 2011.Lives with brother.Retired,previously maintenance work for [employer]   Social Determinants of Health   Financial Resource Strain: Low Risk    Difficulty of Paying Living Expenses: Not very hard  Food Insecurity: No Food Insecurity   Worried About Charity fundraiser in the Last Year: Never true   Arboriculturist in the Last Year: Never true  Transportation Needs: Public librarian (Medical): Yes   Lack of Transportation (Non-Medical): Yes  Physical Activity: Not on file  Stress: Not on file  Social Connections: Socially Isolated   Frequency of Communication with Friends and Family: More than three times a week   Frequency of Social Gatherings with Friends and Family: More than three times a week   Attends Religious Services: Never   Marine scientist or Organizations: No   Attends Music therapist: Never   Marital Status: Separated  Intimate Partner Violence: Not At Risk   Fear of Current or Ex-Partner: No   Emotionally Abused: No   Physically Abused: No   Sexually Abused: No    Review of Systems: Gen: Denies fever, sweats or chills. No weight loss.  CV: Denies chest pain, palpitations or edema. Resp: Denies cough, shortness of breath of hemoptysis.  GI: Denies heartburn, dysphagia, stomach or lower abdominal pain. No diarrhea or constipation. No rectal bleeding or melena.   GU : Denies urinary burning, blood in urine, increased urinary frequency or incontinence. MS: Denies joint pain, muscles aches or weakness. Derm: Denies rash, itchiness, skin lesions or unhealing ulcers. Psych: Denies depression, anxiety, memory loss, suicidal ideation or confusion. Heme: Denies easy bruising, bleeding. Neuro:  Denies headaches, dizziness  or paresthesias. Endo:  Denies any problems with DM, thyroid or adrenal function.  Physical Exam: Vital signs in last 24 hours: Temp:  [98.2 F (36.8 C)-98.8 F (37.1 C)] 98.8 F (37.1 C) (07/21 0909) Pulse Rate:  [75-87] 75 (07/21 0909) Resp:  [16-17] 16 (07/21 0909) BP: (98-140)/(47-70) 98/51 (07/21 0909) SpO2:  [92 %-95 %] 95 % (07/21 0909) Last BM Date: 10/26/20 General:  Alert ill-appearing 80 year old male in no acute distress. Head:  Normocephalic and atraumatic. Eyes:  No scleral icterus. Conjunctiva pink. Ears:  Normal auditory acuity. Nose:  No deformity, discharge or lesions. Mouth: Absent dentition.  No ulcers or lesions.  Neck:  Supple. No lymphadenopathy or thyromegaly.  Lungs: Breath sounds clear in the right lung fields, crackles to the left base. Heart: Regular rate and rhythm, no murmurs. Abdomen: Soft, nondistended.  Nontender.  Positive bowel sounds all 4 quadrants. Rectal: Deferred. FOBT +. BM at 5am documented as mushy and black in color.  Musculoskeletal:  Bilateral BKA, right BKA drsg intact.  Pulses:  Normal pulses noted. Extremities:  Without clubbing or edema. Neurologic:  Alert and  oriented x4. No focal deficits.  Skin:  Intact without significant lesions or rashes. Psych:  Alert and cooperative. Normal mood and affect.  Intake/Output from previous day: 07/20 0701 - 07/21 0700 In: 2228.8 [P.O.:480; I.V.:1748.8] Out: 325 [Urine:325] Intake/Output this shift: Total I/O In: 480 [P.O.:480] Out: 0   Lab Results: Recent Labs    10/25/20 0413 10/25/20 2003 10/26/20 0556 10/27/20 0531  WBC 12.3*  --  12.3* 13.3*  HGB 6.3* 9.1* 10.3* 9.0*  HCT 21.9* 28.4* 32.0* 28.6*  PLT 381  --  447* 428*   BMET Recent Labs    10/25/20 0413 10/26/20 0556 10/27/20 0531  NA 139 137 137  K 3.8 3.7 3.6  CL 109 102 105  CO2 $Re'23 30 28  'aGp$ GLUCOSE 110* 125* 102*  BUN $Re'13 12 8  'OPS$ CREATININE 0.49* 0.49* 0.45*  CALCIUM 7.6* 8.0* 8.1*   LFT No results for  input(s): PROT, ALBUMIN, AST, ALT, ALKPHOS, BILITOT, BILIDIR, IBILI in the last 72 hours. PT/INR No results for input(s): LABPROT, INR in the last 72 hours. Hepatitis Panel No results for input(s): HEPBSAG, HCVAB, HEPAIGM, HEPBIGM in the last 72 hours.   Studies/Results: No results found.  IMPRESSION/PLAN:  75.  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 Hg dropped from 7.4 -> 6.3 on 7/19. Transfused 2 units of PRBCs. Post transfusion Hg 9.1 -> Hg 10.3 on 7/20 -> today Hg 9.0.  BUN 8.  He is hemodynamically stable. -Transfuse for hemoglobin less than 7 -Repeat CBC in a.m. -Pantoprazole 40 mg po twice daily -EGD benefits and risks discussed including risk with sedation, risk of bleeding, perforation and infection.  Patient currently declines to proceed with an EGD at this time, he stated he wants time to think about it. -He does not wish to consider a colonoscopy  2. 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 S/P right AKA on 10/23/2020 on Cefepime and Vanco followed by vascular surgery.   3. Lung adenocarcinoma with metastasis to the brain  4. Pulmonary embolus. Status post IVC filter 07/05/2020.    Patrecia Pour Kennedy-Smith  10/27/2020, 1:22 PM

## 2020-10-27 NOTE — Consult Note (Signed)
   [facility] Inpatient Consult   10/27/2020  Thomas Garcia 1940/07/26 [id number]  [place] Organization [ACO] Patient: [employer]  Primary Care Provider:  Doree Albee, MD and Embedded provider with Chronic Care Management  [CCM] team   Patient is active prior to admission with the Embedded CCM RN from previous admission and referral.  Patient will have the transition of care call conducted by the primary care provider. Medical record review of progress notes reveals patient is being recommended for a skilled nursing level of care for post hospital transitional care needs.  Plan: Will send notification sto the [place] and made aware of above recommendations for post hospital transition. Continue to follow until disposition is final.   Please contact for further questions,  Natividad Brood, RN BSN [facility] Liaison  [phone] business mobile phone Toll free office [phone]  Fax number: [phone] [email or web address]

## 2020-10-27 NOTE — Progress Notes (Signed)
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  The last BKA was done on 09/28/2020 by Dr. Carlis Abbott.  He was recovering well until about 2 weeks ago he noticed some drainage from the incision site.  Patient has been admitted with wound infection after recent surgery.  He was transferred to [facility] to be evaluated by vascular surgery.  Patient underwent above-knee amputation on 7/17.   Assessment & Plan:   Principal Problem:   Wound infection after surgery Active Problems:   Benign essential HTN   Essential hypertension   Diabetes mellitus type 2 in nonobese (HCC)   History of pulmonary embolism   Malignant neoplasm of upper lobe of right lung (HCC)  Sepsis secondary to necrotic nonhealing BKA - questionable concurrent infection, POA: -X ray: Soft tissue swelling and gas about the surgical site, status post below the knee a amputation. Findings suggest cellulitis and possible necrotizing fasciitis. No gross osseous destruction to confirm recurrent osteomyelitis. -Discontinue IV vancomycin,Cefepime, flagyl -per vascular team -Patient underwent subsequent AKA on 7/17 - staple removal in 3-4 weeks. -Follow WBC trend.    Anemia; likely chronic iron deficiency, POA Rule out concurrent acute blood loss anemia  - FOBT incidentally positive, black stool noted, will discuss with GI for formal consult versus sideline pending repeat hemoglobin (downtrending slowly overnight) - Status post 1 unit PRBC postoperatively -Patient denies ever having colonoscopy or endoscopy despite being 80 years old, he states he had been sent a Cologuard test in the past but declines ever having completed it.  Type 2 Diabetes,  non-insulin-dependent, well controlled Continue to hold home metformin. Continue with correction insulin.   Hold Levemir due to hypoglycemia and poor oral intake.  Follow CBGs   Hstory of PE:  Noted to have a small peripheral PEs in the past and patient has declined anticoagulation previously.   Neoplasm of the right lung with metastatic disease: Need to follow-up with his oncologist   Obesity Estimated body mass index is 31.82 kg/m as calculated from the following: - Height as of this encounter: 4\' 9"  (1.448 m). - Weight as of this encounter: 66.7 kg.   DVT prophylaxis: Hold Heparin due to drop in hb/black stool Code Status: DNR Family Communication: Care discussed with patient  Status is: Inpatient  Dispo: The patient is from: Home              Anticipated d/c is to: SNF - patient agreeable for SNF               Anticipated d/c date is: 48 to 72 hours              Patient currently not medically stable for discharge  Consultants:  Vascular surgery  Procedures:  Right AKA 10/23/2020  Antimicrobials:  Discontinued  Subjective: No acute issues or events overnight denies nausea vomiting diarrhea constipation headache fevers or chills  Objective: Vitals:   10/26/20 0510 10/26/20 0902 10/26/20 2134 10/27/20 0438  BP: (!) 149/59 (!) 103/43 140/70 (!) 119/47  Pulse: 73 66 87 80  Resp: 18 17 17 16   Temp: 98.2 F (36.8 C) 98 F (36.7 C) 98.2 F (36.8 C) 98.4 F (36.9 C)  TempSrc:  Oral  Oral  SpO2: 98%  96% 92% 95%  Weight:      Height:        Intake/Output Summary (Last 24 hours) at 10/27/2020 0823 Last data filed at 10/27/2020 9323 Gross per 24 hour  Intake 2468.75 ml  Output 325 ml  Net 2143.75 ml    Filed Weights   10/22/20 1337 10/22/20 2103 10/25/20 0508  Weight: 74.7 kg 74.7 kg 66.7 kg    Examination:  General:  Pleasantly resting in bed, No acute distress. HEENT:  Normocephalic atraumatic.  Sclerae nonicteric, noninjected.  Extraocular movements  intact bilaterally. Neck:  Without mass or deformity.  Trachea is midline. Lungs:  Clear to auscultate bilaterally without rhonchi, wheeze, or rales. Heart:  Regular rate and rhythm.  Without murmurs, rubs, or gallops. Abdomen:  Soft, nontender, nondistended.  Without guarding or rebound. Extremities: Right AKA dressing clean dry intact.  Chronic left BKA with postsurgical changes. Skin:  Warm and dry, no erythema, no ulcerations.   Data Reviewed: I have personally reviewed following labs and imaging studies  CBC: Recent Labs  Lab 10/21/20 1237 10/22/20 0620 10/23/20 0433 10/24/20 1123 10/25/20 0413 10/25/20 2003 10/26/20 0556 10/27/20 0531  WBC 16.5*   < > 15.8* 19.4* 12.3*  --  12.3* 13.3*  NEUTROABS 13.7*  --   --   --   --   --   --   --   HGB 9.3*   < > 8.5* 7.4* 6.3* 9.1* 10.3* 9.0*  HCT 31.0*   < > 28.0* 24.5* 21.9* 28.4* 32.0* 28.6*  MCV 87.8   < > 86.4 86.6 89.8  --  85.3 86.7  PLT 660*   < > 565* 457* 381  --  447* 428*   < > = values in this interval not displayed.    Basic Metabolic Panel: Recent Labs  Lab 10/23/20 0433 10/24/20 0644 10/25/20 0413 10/26/20 0556 10/27/20 0531  NA 134* 137 139 137 137  K 4.0 4.7 3.8 3.7 3.6  CL 99 105 109 102 105  CO2 29 24 23 30 28   GLUCOSE 97 114* 110* 125* 102*  BUN 12 12 13 12 8   CREATININE 0.66 0.61 0.49* 0.49* 0.45*  CALCIUM 8.4* 8.1* 7.6* 8.0* 8.1*  MG 1.9  --   --   --   --     GFR: Estimated Creatinine Clearance: 56.5 mL/min (A) (by C-G formula based on SCr of 0.45 mg/dL (L)). Liver Function Tests: Recent Labs  Lab 10/21/20 1237  AST 10*  ALT 9  ALKPHOS 97  BILITOT 0.4  PROT 6.6  ALBUMIN 2.8*    No results for input(s): LIPASE, AMYLASE in the last 168 hours. No results for input(s): AMMONIA in the last 168 hours. Coagulation Profile: No results for input(s): INR, PROTIME in the last 168 hours. Cardiac Enzymes: No results for input(s): CKTOTAL, CKMB, CKMBINDEX, TROPONINI in the last 168  hours. BNP (last 3 results) No results for input(s): PROBNP in the last 8760 hours. HbA1C: No results for input(s): HGBA1C in the last 72 hours. CBG: Recent Labs  Lab 10/26/20 0704 10/26/20 1135 10/26/20 1653 10/26/20 2134 10/27/20 0644  GLUCAP 127* 137* 125* 115* 109*    Lipid Profile: No results for input(s): CHOL, HDL, LDLCALC, TRIG, CHOLHDL, LDLDIRECT in the last 72 hours. Thyroid Function Tests: No results for input(s): TSH, T4TOTAL, FREET4, T3FREE, THYROIDAB in the last 72 hours. Anemia Panel: No results for input(s): VITAMINB12, FOLATE, FERRITIN, TIBC, IRON, RETICCTPCT in the last 72 hours. Sepsis Labs: No results  for input(s): PROCALCITON, LATICACIDVEN in the last 168 hours.  Recent Results (from the past 240 hour(s))  Blood culture (routine x 2)     Status: None   Collection Time: 10/21/20 12:38 PM   Specimen: Left Antecubital; Blood  Result Value Ref Range Status   Specimen Description   Final    LEFT ANTECUBITAL BOTTLES DRAWN AEROBIC AND ANAEROBIC   Special Requests Blood Culture adequate volume  Final   Culture   Final    NO GROWTH 5 DAYS Performed at [facility], [address] [place], [place] [address]    Report Status 10/26/2020 FINAL  Final  Blood culture (routine x 2)     Status: None   Collection Time: 10/21/20  1:52 PM   Specimen: Right Antecubital; Blood  Result Value Ref Range Status   Specimen Description   Final    RIGHT ANTECUBITAL BOTTLES DRAWN AEROBIC AND ANAEROBIC   Special Requests Blood Culture adequate volume  Final   Culture   Final    NO GROWTH 5 DAYS Performed at [facility], [address] [place], [place] [address]    Report Status 10/26/2020 FINAL  Final  Surgical pcr screen     Status: Abnormal   Collection Time: 10/22/20 10:40 PM   Specimen: Nasal Mucosa; Nasal Swab  Result Value Ref Range Status   MRSA, PCR POSITIVE (A) NEGATIVE Final    Comment: RESULT CALLED TO, READ BACK BY AND VERIFIED WITH: E. CASTRO RN, AT [phone]  10/23/20 D. VANHOOK    Staphylococcus aureus POSITIVE (A) NEGATIVE Final    Comment: (NOTE) The Xpert SA Assay (FDA approved for NASAL specimens in patients 84 years of age and older), is one component of a comprehensive surveillance program. It is not intended to diagnose infection nor to guide or monitor treatment. Performed at [facility], [place] [address], [place], [place] [address]           Radiology Studies: No results found.      Scheduled Meds:  sodium chloride   Intravenous Once   atorvastatin  10 mg Oral q1800   docusate sodium  100 mg Oral Daily   feeding supplement  237 mL Oral BID BM   ferrous sulfate  324 mg Oral Q breakfast   insulin aspart  0-9 Units Subcutaneous TID WC   levETIRAcetam  500 mg Oral BID   mupirocin ointment  1 application Nasal BID   nicotine  7 mg Transdermal Daily   nystatin  5 mL Oral QID   oxyCODONE  10 mg Oral Q12H   pantoprazole  40 mg Oral Q0600   Continuous Infusions:  lactated ringers 75 mL/hr at 10/26/20 1953    LOS: 6 days   Time spent: 72min  [place] Hy C Ilah Boule, DO [facility]  If 7PM-7AM, please contact night-coverage [email or web address]  10/27/2020, 8:23 AM

## 2020-10-28 DIAGNOSIS — R195 Other fecal abnormalities: Secondary | ICD-10-CM | POA: Diagnosis not present

## 2020-10-28 DIAGNOSIS — E43 Unspecified severe protein-calorie malnutrition: Secondary | ICD-10-CM | POA: Insufficient documentation

## 2020-10-28 DIAGNOSIS — D649 Anemia, unspecified: Secondary | ICD-10-CM | POA: Diagnosis not present

## 2020-10-28 DIAGNOSIS — T8149XA Infection following a procedure, other surgical site, initial encounter: Secondary | ICD-10-CM | POA: Diagnosis not present

## 2020-10-28 LAB — CBC
HCT: 25.8 % — ABNORMAL LOW (ref 39.0–52.0)
Hemoglobin: 8.1 g/dL — ABNORMAL LOW (ref 13.0–17.0)
MCH: 27.7 pg (ref 26.0–34.0)
MCHC: 31.4 g/dL (ref 30.0–36.0)
MCV: 88.4 fL (ref 80.0–100.0)
Platelets: 416 10*3/uL — ABNORMAL HIGH (ref 150–400)
RBC: 2.92 MIL/uL — ABNORMAL LOW (ref 4.22–5.81)
RDW: 20.1 % — ABNORMAL HIGH (ref 11.5–15.5)
WBC: 12.6 10*3/uL — ABNORMAL HIGH (ref 4.0–10.5)
nRBC: 0 % (ref 0.0–0.2)

## 2020-10-28 LAB — GLUCOSE, CAPILLARY
Glucose-Capillary: 123 mg/dL — ABNORMAL HIGH (ref 70–99)
Glucose-Capillary: 184 mg/dL — ABNORMAL HIGH (ref 70–99)
Glucose-Capillary: 158 mg/dL — ABNORMAL HIGH (ref 70–99)
Glucose-Capillary: 130 mg/dL — ABNORMAL HIGH (ref 70–99)

## 2020-10-28 MED ORDER — ADULT MULTIVITAMIN W/MINERALS CH
1.0000 | ORAL_TABLET | Freq: Every day | ORAL | Status: DC
  Administered 2020-10-28 – 2020-11-01 (×5): 1 via ORAL
  Filled 2020-10-28 (×5): qty 1

## 2020-10-28 MED ORDER — PEG-KCL-NACL-NASULF-NA ASC-C 100 G PO SOLR
1.0000 | Freq: Once | ORAL | Status: AC
  Administered 2020-10-28: 200 g via ORAL
  Filled 2020-10-28: qty 1

## 2020-10-28 MED ORDER — PROSOURCE PLUS PO LIQD
30.0000 mL | Freq: Two times a day (BID) | ORAL | Status: DC
  Administered 2020-10-28 – 2020-11-01 (×8): 30 mL via ORAL
  Filled 2020-10-28 (×8): qty 30

## 2020-10-28 MED ORDER — BOOST / RESOURCE BREEZE PO LIQD CUSTOM
1.0000 | Freq: Three times a day (TID) | ORAL | Status: DC
Start: 2020-10-28 — End: 2020-11-02
  Administered 2020-10-28 – 2020-11-01 (×12): 1 via ORAL

## 2020-10-28 NOTE — Progress Notes (Signed)
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  The last BKA was done on 09/28/2020 by Dr. Carlis Abbott.  He was recovering well until about 2 weeks ago he noticed some drainage from the incision site.  Patient has been admitted with wound infection after recent surgery.  He was transferred to [facility] to be evaluated by vascular surgery.  Patient underwent above-knee amputation on 7/17.   Assessment & Plan:   Principal Problem:   Wound infection after surgery Active Problems:   Benign essential HTN   Essential hypertension   Diabetes mellitus type 2 in nonobese (HCC)   History of pulmonary embolism   Malignant neoplasm of upper lobe of right lung (HCC)   Anemia   Heme positive stool  Sepsis secondary to necrotic nonhealing BKA - questionable concurrent infection, POA: -X ray: Soft tissue swelling and gas about the surgical site, status post below the knee a amputation. Findings suggest cellulitis and possible necrotizing fasciitis. No gross osseous destruction to confirm recurrent osteomyelitis. -Discontinue IV vancomycin,Cefepime, flagyl -per vascular team -Patient underwent subsequent AKA on 7/17 - staple removal in 3-4 weeks. -Follow WBC trend.    Anemia; likely chronic iron deficiency, POA Rule out concurrent acute blood loss anemia  - FOBT positive, declines previous upper or lower endoscopy  - Status post 1 unit PRBC postoperatively - Pending upper and lower endoscopy with GI 10/29/2020, appreciate insight and recommendations.  Type 2 Diabetes, non-insulin-dependent, well controlled Continue to hold home metformin. Continue with correction insulin.   Hold Levemir due to hypoglycemia and poor oral  intake.  Follow CBGs   Hstory of PE:  Noted to have a small peripheral PEs in the past and patient has declined anticoagulation previously.   Neoplasm of the right lung with metastatic disease: Need to follow-up with his oncologist   Obesity Estimated body mass index is 31.82 kg/m as calculated from the following: - Height as of this encounter: 4\' 9"  (1.448 m). - Weight as of this encounter: 66.7 kg.   DVT prophylaxis: Hold Heparin due to drop in hb/black stool Code Status: DNR Family Communication: Care discussed with patient  Status is: Inpatient  Dispo: The patient is from: Home              Anticipated d/c is to: SNF - patient agreeable for SNF               Anticipated d/c date is: 48 to 72 hours              Patient currently not medically stable for discharge  Consultants:  Vascular surgery  Procedures:  Right AKA 10/23/2020  Antimicrobials:  Discontinued  Subjective: No acute issues or events overnight denies nausea vomiting diarrhea constipation headache fevers or chills  Objective: Vitals:   10/27/20 0909 10/27/20 1752 10/27/20 2110 10/28/20 0509  BP: (!) 98/51 101/67 (!) 122/54 (!) 141/52  Pulse: 75 77 74 77  Resp: 16 18 17 17   Temp: 98.8 F (37.1 C) 98.6 F (37 C) 98.4 F (36.9 C) 98.4 F (36.9 C)  TempSrc: Oral Oral  Oral  SpO2: 95% 96% 97% 92%  Weight:   66.7 kg   Height:        Intake/Output Summary (  Last 24 hours) at 10/28/2020 0803 Last data filed at 10/28/2020 0600 Gross per 24 hour  Intake 600 ml  Output 625 ml  Net -25 ml    Filed Weights   10/22/20 2103 10/25/20 0508 10/27/20 2110  Weight: 74.7 kg 66.7 kg 66.7 kg    Examination:  General:  Pleasantly resting in bed, No acute distress. HEENT:  Normocephalic atraumatic.  Sclerae nonicteric, noninjected.  Extraocular movements intact bilaterally. Neck:  Without mass or deformity.  Trachea is midline. Lungs:  Clear to auscultate bilaterally without rhonchi, wheeze, or rales. Heart:   Regular rate and rhythm.  Without murmurs, rubs, or gallops. Abdomen:  Soft, nontender, nondistended.  Without guarding or rebound. Extremities: Right AKA dressing clean dry intact.  Chronic left BKA with postsurgical changes. Skin:  Warm and dry, no erythema, no ulcerations.   Data Reviewed: I have personally reviewed following labs and imaging studies  CBC: Recent Labs  Lab 10/21/20 1237 10/22/20 0620 10/24/20 1123 10/25/20 0413 10/25/20 2003 10/26/20 0556 10/27/20 0531 10/28/20 0511  WBC 16.5*   < > 19.4* 12.3*  --  12.3* 13.3* 12.6*  NEUTROABS 13.7*  --   --   --   --   --   --   --   HGB 9.3*   < > 7.4* 6.3* 9.1* 10.3* 9.0* 8.1*  HCT 31.0*   < > 24.5* 21.9* 28.4* 32.0* 28.6* 25.8*  MCV 87.8   < > 86.6 89.8  --  85.3 86.7 88.4  PLT 660*   < > 457* 381  --  447* 428* 416*   < > = values in this interval not displayed.    Basic Metabolic Panel: Recent Labs  Lab 10/23/20 0433 10/24/20 0644 10/25/20 0413 10/26/20 0556 10/27/20 0531  NA 134* 137 139 137 137  K 4.0 4.7 3.8 3.7 3.6  CL 99 105 109 102 105  CO2 29 24 23 30 28   GLUCOSE 97 114* 110* 125* 102*  BUN 12 12 13 12 8   CREATININE 0.66 0.61 0.49* 0.49* 0.45*  CALCIUM 8.4* 8.1* 7.6* 8.0* 8.1*  MG 1.9  --   --   --   --     GFR: Estimated Creatinine Clearance: 56.5 mL/min (A) (by C-G formula based on SCr of 0.45 mg/dL (L)). Liver Function Tests: Recent Labs  Lab 10/21/20 1237  AST 10*  ALT 9  ALKPHOS 97  BILITOT 0.4  PROT 6.6  ALBUMIN 2.8*    No results for input(s): LIPASE, AMYLASE in the last 168 hours. No results for input(s): AMMONIA in the last 168 hours. Coagulation Profile: No results for input(s): INR, PROTIME in the last 168 hours. Cardiac Enzymes: No results for input(s): CKTOTAL, CKMB, CKMBINDEX, TROPONINI in the last 168 hours. BNP (last 3 results) No results for input(s): PROBNP in the last 8760 hours. HbA1C: No results for input(s): HGBA1C in the last 72 hours. CBG: Recent Labs   Lab 10/27/20 0644 10/27/20 1140 10/27/20 1635 10/27/20 2109 10/28/20 0642  GLUCAP 109* 120* 152* 119* 123*    Lipid Profile: No results for input(s): CHOL, HDL, LDLCALC, TRIG, CHOLHDL, LDLDIRECT in the last 72 hours. Thyroid Function Tests: No results for input(s): TSH, T4TOTAL, FREET4, T3FREE, THYROIDAB in the last 72 hours. Anemia Panel: No results for input(s): VITAMINB12, FOLATE, FERRITIN, TIBC, IRON, RETICCTPCT in the last 72 hours. Sepsis Labs: No results for input(s): PROCALCITON, LATICACIDVEN in the last 168 hours.  Recent Results (from the past 240 hour(s))  Blood culture (  routine x 2)     Status: None   Collection Time: 10/21/20 12:38 PM   Specimen: Left Antecubital; Blood  Result Value Ref Range Status   Specimen Description   Final    LEFT ANTECUBITAL BOTTLES DRAWN AEROBIC AND ANAEROBIC   Special Requests Blood Culture adequate volume  Final   Culture   Final    NO GROWTH 5 DAYS Performed at [facility], [address], [place], [place] [address]    Report Status 10/26/2020 FINAL  Final  Blood culture (routine x 2)     Status: None   Collection Time: 10/21/20  1:52 PM   Specimen: Right Antecubital; Blood  Result Value Ref Range Status   Specimen Description   Final    RIGHT ANTECUBITAL BOTTLES DRAWN AEROBIC AND ANAEROBIC   Special Requests Blood Culture adequate volume  Final   Culture   Final    NO GROWTH 5 DAYS Performed at [facility], [address] [place], [place] [address]    Report Status 10/26/2020 FINAL  Final  Surgical pcr screen     Status: Abnormal   Collection Time: 10/22/20 10:40 PM   Specimen: Nasal Mucosa; Nasal Swab  Result Value Ref Range Status   MRSA, PCR POSITIVE (A) NEGATIVE Final    Comment: RESULT CALLED TO, READ BACK BY AND VERIFIED WITH: E. CASTRO RN, AT [phone] 10/23/20 D. VANHOOK    Staphylococcus aureus POSITIVE (A) NEGATIVE Final    Comment: (NOTE) The Xpert SA Assay (FDA approved for NASAL specimens in patients 62 years  of age and older), is one component of a comprehensive surveillance program. It is not intended to diagnose infection nor to guide or monitor treatment. Performed at [facility], [place] [address] [place], [place] [address]           Radiology Studies: No results found.      Scheduled Meds:  sodium chloride   Intravenous Once   atorvastatin  10 mg Oral q1800   docusate sodium  100 mg Oral Daily   feeding supplement  237 mL Oral BID BM   ferrous sulfate  324 mg Oral Q breakfast   insulin aspart  0-9 Units Subcutaneous TID WC   levETIRAcetam  500 mg Oral BID   nicotine  7 mg Transdermal Daily   nystatin  5 mL Oral QID   oxyCODONE  10 mg Oral Q12H   pantoprazole  40 mg Oral Q0600   Continuous Infusions:    LOS: 7 days   Time spent: 92min  Ilayda Toda C Dewell Monnier, DO [facility]  If 7PM-7AM, please contact night-coverage [email or web address]  10/28/2020, 8:03 AM

## 2020-10-28 NOTE — Progress Notes (Signed)
[facility]  Subjective  -no complaints   Objective (!) 114/58 81 98 F (36.7 C) (Oral) 18 95%  Intake/Output Summary (Last 24 hours) at 10/28/2020 1127 Last data filed at 10/28/2020 0839 Gross per 24 hour  Intake 660 ml  Output 625 ml  Net 35 ml    Right AKA with staples healing  Laboratory Lab Results: Recent Labs    10/27/20 0531 10/28/20 0511  WBC 13.3* 12.6*  HGB 9.0* 8.1*  HCT 28.6* 25.8*  PLT 428* 416*   BMET Recent Labs    10/26/20 0556 10/27/20 0531  NA 137 137  K 3.7 3.6  CL 102 105  CO2 30 28  GLUCOSE 125* 102*  BUN 12 8  CREATININE 0.49* 0.45*  CALCIUM 8.0* 8.1*    COAG Lab Results  Component Value Date   INR 1.0 07/07/2020   INR 1.1 06/26/2020   INR 1.1 09/08/2018   No results found for: PTT   80 year old male status postconversion of right BKA to an AKA.  This looks good and is healing appropriately.  He has follow-up with [name] in about 3 weeks for staple removal.  Please call vascular over the weekend if questions or concerns.  Marty Heck 10/28/2020 11:27 AM --

## 2020-10-28 NOTE — Progress Notes (Signed)
      Progress Note   Subjective  Patient denies any overt bleeding, feels otherwise stable. Hgb has drifted again. He thought about procedures overnight and wishes to proceed after discussion. Otherwise feels the same    Objective   Vital signs in last 24 hours: Temp:  [98.4 F (36.9 C)-98.6 F (37 C)] 98.4 F (36.9 C) (07/22 0509) Pulse Rate:  [74-77] 77 (07/22 0509) Resp:  [17-18] 17 (07/22 0509) BP: (101-141)/(52-67) 141/52 (07/22 0509) SpO2:  [92 %-97 %] 92 % (07/22 0509) Weight:  [66.7 kg] 66.7 kg (07/21 2110) Last BM Date: 10/27/20 General:    male in NAD Abdomen:  Soft, nontender and nondistended.  Neurologic:  Alert and oriented,  grossly normal neurologically. Psych:  Cooperative. Normal mood and affect.  Intake/Output from previous day: 07/21 0701 - 07/22 0700 In: 600 [P.O.:600] Out: 625 [Urine:625] Intake/Output this shift: Total I/O In: 300 [P.O.:300] Out: -   Lab Results: Recent Labs    10/26/20 0556 10/27/20 0531 10/28/20 0511  WBC 12.3* 13.3* 12.6*  HGB 10.3* 9.0* 8.1*  HCT 32.0* 28.6* 25.8*  PLT 447* 428* 416*   BMET Recent Labs    10/26/20 0556 10/27/20 0531  NA 137 137  K 3.7 3.6  CL 102 105  CO2 30 28  GLUCOSE 125* 102*  BUN 12 8  CREATININE 0.49* 0.45*  CALCIUM 8.0* 8.1*   LFT No results for input(s): PROT, ALBUMIN, AST, ALT, ALKPHOS, BILITOT, BILIDIR, IBILI in the last 72 hours. PT/INR No results for input(s): LABPROT, INR in the last 72 hours.  Studies/Results: No results found.     Assessment / Plan:    80 y/o male with DM, lung CA with mets to brain s/p craniotomy and XRT, PE with IVC filter in place not on anticoagulation, recent hospitalization for osteomyelitis s/p left BKA and most recently R BKA, consulted for recurrent anemia and heme positive stool. He has endorsed some dark stools but also on iron. Our service previously saw him in June for this issue and he declined an EGD and colonoscopy. He has since had  recurrent anemia and needed a few units of blood. Dark stools but BUN has been normal.   Discussed endoscopic evaluation with patient and his friend last night again again with him this AM. His Hgb has again drifted today. Recommending EGD and colonoscopy to evaluated. I discussed what these procedures would entail with him, risks of anesthesia and the exams. He is agreeable to proceed. Clear liquid diet today, prep tonight, NPO after MN, EGD and colonoscopy tomorrow.  Plan: - clear liquid diet today, NPO after MN - prep tonight - EGD and colonoscopy tomorrow  - trend Hgb  Call with questions.  Jolly Mango, MD [facility]

## 2020-10-28 NOTE — H&P (View-Only) (Signed)
      Progress Note   Subjective  Patient denies any overt bleeding, feels otherwise stable. Hgb has drifted again. He thought about procedures overnight and wishes to proceed after discussion. Otherwise feels the same    Objective   Vital signs in last 24 hours: Temp:  [98.4 F (36.9 C)-98.6 F (37 C)] 98.4 F (36.9 C) (07/22 0509) Pulse Rate:  [74-77] 77 (07/22 0509) Resp:  [17-18] 17 (07/22 0509) BP: (101-141)/(52-67) 141/52 (07/22 0509) SpO2:  [92 %-97 %] 92 % (07/22 0509) Weight:  [66.7 kg] 66.7 kg (07/21 2110) Last BM Date: 10/27/20 General:    male in NAD Abdomen:  Soft, nontender and nondistended.  Neurologic:  Alert and oriented,  grossly normal neurologically. Psych:  Cooperative. Normal mood and affect.  Intake/Output from previous day: 07/21 0701 - 07/22 0700 In: 600 [P.O.:600] Out: 625 [Urine:625] Intake/Output this shift: Total I/O In: 300 [P.O.:300] Out: -   Lab Results: Recent Labs    10/26/20 0556 10/27/20 0531 10/28/20 0511  WBC 12.3* 13.3* 12.6*  HGB 10.3* 9.0* 8.1*  HCT 32.0* 28.6* 25.8*  PLT 447* 428* 416*   BMET Recent Labs    10/26/20 0556 10/27/20 0531  NA 137 137  K 3.7 3.6  CL 102 105  CO2 30 28  GLUCOSE 125* 102*  BUN 12 8  CREATININE 0.49* 0.45*  CALCIUM 8.0* 8.1*   LFT No results for input(s): PROT, ALBUMIN, AST, ALT, ALKPHOS, BILITOT, BILIDIR, IBILI in the last 72 hours. PT/INR No results for input(s): LABPROT, INR in the last 72 hours.  Studies/Results: No results found.     Assessment / Plan:    80 y/o male with DM, lung CA with mets to brain s/p craniotomy and XRT, PE with IVC filter in place not on anticoagulation, recent hospitalization for osteomyelitis s/p left BKA and most recently R BKA, consulted for recurrent anemia and heme positive stool. He has endorsed some dark stools but also on iron. Our service previously saw him in June for this issue and he declined an EGD and colonoscopy. He has since had  recurrent anemia and needed a few units of blood. Dark stools but BUN has been normal.   Discussed endoscopic evaluation with patient and his friend last night again again with him this AM. His Hgb has again drifted today. Recommending EGD and colonoscopy to evaluated. I discussed what these procedures would entail with him, risks of anesthesia and the exams. He is agreeable to proceed. Clear liquid diet today, prep tonight, NPO after MN, EGD and colonoscopy tomorrow.  Plan: - clear liquid diet today, NPO after MN - prep tonight - EGD and colonoscopy tomorrow  - trend Hgb  Call with questions.  Jolly Mango, MD [facility]

## 2020-10-28 NOTE — Progress Notes (Signed)
Physical Therapy Treatment Patient Details Name: Thomas Garcia MRN: [id number] DOB: Aug 12, 1940 Today's Date: 10/28/2020    History of Present Illness 80 y.o. male presented to [facility] on 7/15 with nonhealing necrotic R BKA. Transferred to [facility] for revision s/p R AKA 7/17. PMHx significant for lung cancer with brain mets, L BKA, Hx of PE and DM with neuropathy.    PT Comments    Pt supine in bed on entry agreeable to therapy, and a little apprehensive about endoscopy procedure. Pt very much wants to return home with his brother. Educated pt that he will need to be able to transfer with more independence to help his brother at home. Pt agrees and request that he be able to work with Suezanne Jacquet at [facility] because the have a good relationship. Pt is making good progress towards goals, he min guard for bed mobility and modA for [facility] transfer to chair. Pt would benefit from SNF level rehab but is motivated and with HHPT will likely be able to safely navigate his home environment especially with aid of hospital bed. PT will continue to follow acutely.   Follow Up Recommendations  Home health PT;Supervision for mobility/OOB     Equipment Recommendations  Hospital bed       Precautions / Restrictions Precautions Precautions: Fall Precaution Comments: R AKA and L BKA Restrictions Weight Bearing Restrictions: Yes RLE Weight Bearing: Non weight bearing LLE Weight Bearing: Non weight bearing    Mobility  Bed Mobility Overal bed mobility: Needs Assistance Bed Mobility: Rolling Rolling: Supervision   Supine to sit: HOB elevated;Min guard     General bed mobility comments: able to come to longsitting with use of bilateral rails, modA for scooting with pad to pivot for AP transfer to chair    Transfers Overall transfer level: Needs assistance Equipment used: None Transfers: Comptroller transfers: Mod assist   General transfer comment: modA for pad scoot to  assist in A-P transfer to chair, increased vc for weight shift from hip to hip and for hand placement for improved leverage         Balance Overall balance assessment: Needs assistance Sitting-balance support: Bilateral upper extremity supported Sitting balance-Leahy Scale: Fair Sitting balance - Comments: Min A to maintain static sitting at EOB with unilateral vs bilateral UE support on bed surface.       Standing balance comment: L AKA, R BKA                            Cognition Arousal/Alertness: Awake/alert Behavior During Therapy: WFL for tasks assessed/performed Overall Cognitive Status: Impaired/Different from baseline                               Problem Solving: Slow processing;Requires verbal cues;Requires tactile cues;Difficulty sequencing General Comments: pt requires increased time and cuing for mobility         General Comments General comments (skin integrity, edema, etc.): VSS on RA, pt not looking forward to procedure to look for GI bleed just wants to go home.      Pertinent Vitals/Pain Pain Assessment: Faces Faces Pain Scale: Hurts a little bit Pain Location: R residual limb Pain Descriptors / Indicators: Aching;Grimacing;Guarding;Sore Pain Intervention(s): Limited activity within patient's tolerance;Monitored during session;Repositioned     PT Goals (current goals can now be found in the care plan section) Acute Rehab  PT Goals Patient Stated Goal: To return home. PT Goal Formulation: With patient Time For Goal Achievement: 11/07/20 Potential to Achieve Goals: Poor Progress towards PT goals: Progressing toward goals    Frequency    Min 3X/week      PT Plan Discharge plan needs to be updated       AM-PAC PT "6 Clicks" Mobility   Outcome Measure  Help needed turning from your back to your side while in a flat bed without using bedrails?: None Help needed moving from lying on your back to sitting on the side of a  flat bed without using bedrails?: A Little Help needed moving to and from a bed to a chair (including a wheelchair)?: Total Help needed standing up from a chair using your arms (e.g., wheelchair or bedside chair)?: Total Help needed to walk in hospital room?: Total Help needed climbing 3-5 steps with a railing? : Total 6 Click Score: 11    End of Session   Activity Tolerance: Patient tolerated treatment well Patient left: in chair;with call bell/phone within reach;with nursing/sitter in room;with chair alarm set Nurse Communication: Mobility status PT Visit Diagnosis: Other abnormalities of gait and mobility (R26.89);Muscle weakness (generalized) (M62.81);Pain Pain - Right/Left: Right Pain - part of body: Leg     Time: 3143-8887 PT Time Calculation (min) (ACUTE ONLY): 29 min  Charges:  $Therapeutic Activity: 23-37 mins                     Harden Bramer B. Migdalia Dk PT, DPT Acute Rehabilitation Services Pager [phone] Office [phone]    [place] 10/28/2020, 10:18 AM

## 2020-10-28 NOTE — Progress Notes (Signed)
   Providing Compassionate, Quality Care - Together  NEUROSURGERY PROGRESS NOTE   S: No complaints.  Patient was unable to follow-up as an outpatient therefore I came to evaluate his cranial incision.  O: EXAM:  BP (!) 114/58 (BP Location: Right Arm)   Pulse 81   Temp 98 F (36.7 C) (Oral)   Resp 18   Ht 4\' 9"  (1.448 m)   Wt 66.7 kg   SpO2 95%   BMI 31.82 kg/m   Awake, alert, oriented  Speech fluent, appropriate  Moves all extremities. Bilateral lower extremity BKA PERRLA Right occipital incision well-healed.  ASSESSMENT:  80 y.o. male with   Metastatic adenocarcinoma of the lung  Status post right craniotomy in April 2022  PLAN: -Incision healed well -Follow-up as outpatient    Thank you for allowing me to participate in this patient's care.  Please do not hesitate to call with questions or concerns.   Elwin Sleight, [facility] Cell: [phone]

## 2020-10-28 NOTE — Progress Notes (Signed)
Initial Nutrition Assessment  DOCUMENTATION CODES:  Severe malnutrition in context of chronic illness  INTERVENTION:  Discontinue Ensure Enlive BID.  Add Boost Breeze po TID, each supplement provides 250 kcal and 9 grams of protein.  Add 30 ml ProSource Plus po BID, each supplement provides 100 kcal and 15 grams of protein.    Add MVI with minerals daily.  NUTRITION DIAGNOSIS:  Severe Malnutrition related to chronic illness, cancer and cancer related treatments as evidenced by severe muscle depletion, moderate fat depletion.  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 6/22 - R BKA 7/17 - R AKA  Pt with very variable intake. Per Epic, intake ranges from 0-100%, mostly 50-100%, however. Spoke with pt at bedside. Pt reports eating well at home - eating whatever his brother prepares for him.  He reports some weight gain recently, but no weight loss.   Admit wt: 74.7 kg Current wt: 66.7 kg Some of this weight was lost regarding pt's R BKA that became R AKA.  On exam, pt with moderate and severe depletions.  Given above information, pt is severely malnourished in the chronic setting.  Recommend discontinuing Ensure Enlive BID and adding Boost Breeze TID and ProSource Plus BID, as well as MVI with minerals.  Medications: reviewed; colace, EE BID, ferrous sulfate, SSI, Keppra BID, oxycodone BID, Protonix  Labs: reviewed; CBG 119-184 (H) HbA1c: 5.5% (09/2020)  NUTRITION - FOCUSED PHYSICAL EXAM: Flowsheet Row Most Recent Value  Orbital Region Moderate depletion  Upper Arm Region Severe depletion  Thoracic and  Lumbar Region Mild depletion  Buccal Region Moderate depletion  Temple Region Moderate depletion  Clavicle Bone Region Severe depletion  Clavicle and Acromion Bone Region Severe depletion  Scapular Bone Region Severe depletion  Dorsal Hand Moderate depletion  Patellar Region Mild depletion  Anterior Thigh Region No depletion  Posterior Calf Region Unable to assess  [bilateral BKA]  Edema (RD Assessment) Mild  [RLE]  Hair Reviewed  Eyes Reviewed  Mouth Reviewed  [Poor dentition]  Skin Reviewed  Nails Reviewed   Diet Order:   Diet Order             Diet NPO time specified  Diet effective midnight           Diet clear liquid Room service appropriate? Yes; Fluid consistency: Thin  Diet effective 0500                  EDUCATION NEEDS:  Education needs have been addressed  Skin:  Skin Assessment: Skin Integrity Issues: Other: R AKA on 7/17  Last BM:  10/27/20 - Type 6, black, medium  Height:  Ht Readings from Last 1 Encounters:  10/22/20 4\' 9"  (1.448 m)   Weight:  Wt Readings from Last 1 Encounters:  10/27/20 66.7 kg   BMI:  Body mass index is 31.82 kg/m.  Estimated Nutritional Needs:  Kcal:  1950-2150 Protein:  85-100 grams Fluid:  >2 L  Derrel Nip, RD, LDN (she/her/hers) Registered Dietitian I After-Hours/Weekend Pager # in [place]

## 2020-10-29 ENCOUNTER — Encounter (HOSPITAL_COMMUNITY)
Admission: EM | Disposition: A | Discharge status: Home Health | Payer: Self-pay | Source: Home / Self Care | Attending: Internal Medicine

## 2020-10-29 ENCOUNTER — Inpatient Hospital Stay (HOSPITAL_COMMUNITY): Payer: Medicare HMO | Admitting: Certified Registered"

## 2020-10-29 ENCOUNTER — Inpatient Hospital Stay (HOSPITAL_COMMUNITY): Payer: Medicare HMO

## 2020-10-29 ENCOUNTER — Encounter (HOSPITAL_COMMUNITY): Payer: Self-pay | Admitting: Anesthesiology

## 2020-10-29 DIAGNOSIS — T8149XA Infection following a procedure, other surgical site, initial encounter: Secondary | ICD-10-CM | POA: Diagnosis not present

## 2020-10-29 DIAGNOSIS — K31819 Angiodysplasia of stomach and duodenum without bleeding: Secondary | ICD-10-CM | POA: Diagnosis not present

## 2020-10-29 DIAGNOSIS — K6389 Other specified diseases of intestine: Secondary | ICD-10-CM

## 2020-10-29 DIAGNOSIS — K635 Polyp of colon: Secondary | ICD-10-CM | POA: Diagnosis not present

## 2020-10-29 DIAGNOSIS — K921 Melena: Secondary | ICD-10-CM | POA: Diagnosis not present

## 2020-10-29 HISTORY — PX: ESOPHAGOGASTRODUODENOSCOPY (EGD) WITH PROPOFOL: SHX5813

## 2020-10-29 HISTORY — PX: SUBMUCOSAL TATTOO INJECTION: SHX6856

## 2020-10-29 HISTORY — PX: COLONOSCOPY WITH PROPOFOL: SHX5780

## 2020-10-29 HISTORY — PX: BIOPSY: SHX5522

## 2020-10-29 LAB — GLUCOSE, CAPILLARY
Glucose-Capillary: 100 mg/dL — ABNORMAL HIGH (ref 70–99)
Glucose-Capillary: 120 mg/dL — ABNORMAL HIGH (ref 70–99)
Glucose-Capillary: 109 mg/dL — ABNORMAL HIGH (ref 70–99)
Glucose-Capillary: 174 mg/dL — ABNORMAL HIGH (ref 70–99)
Glucose-Capillary: 99 mg/dL (ref 70–99)

## 2020-10-29 LAB — BASIC METABOLIC PANEL
Anion gap: 6 (ref 5–15)
BUN: 5 mg/dL — ABNORMAL LOW (ref 8–23)
CO2: 26 mmol/L (ref 22–32)
Calcium: 8.1 mg/dL — ABNORMAL LOW (ref 8.9–10.3)
Chloride: 106 mmol/L (ref 98–111)
Creatinine, Ser: 0.36 mg/dL — ABNORMAL LOW (ref 0.61–1.24)
GFR, Estimated: >60 mL/min (ref >60)
Glucose, Bld: 125 mg/dL — ABNORMAL HIGH (ref 70–99)
Potassium: 3.7 mmol/L (ref 3.5–5.1)
Sodium: 138 mmol/L (ref 135–145)

## 2020-10-29 IMAGING — CT CT ABD-PELV W/ CM
1 series · 9 of 35 positions shown, 11 images · IV contrast (Omni 300)
Comparison: [DATE]

CLINICAL DATA: Metastatic disease evaluation

EXAM:
CT ABDOMEN AND PELVIS WITH CONTRAST
TECHNIQUE: Multidetector CT imaging of the abdomen and pelvis was performed
using the standard protocol following bolus administration of
intravenous contrast.
CONTRAST:  75mL OMNIPAQUE IOHEXOL 300 MG/ML  SOLN

[Series 7: a/p w/ sag · sagittal · 0.63mm/px · 9 of 193 slices shown, 11 images]
[im 19/193  soft-tissue]
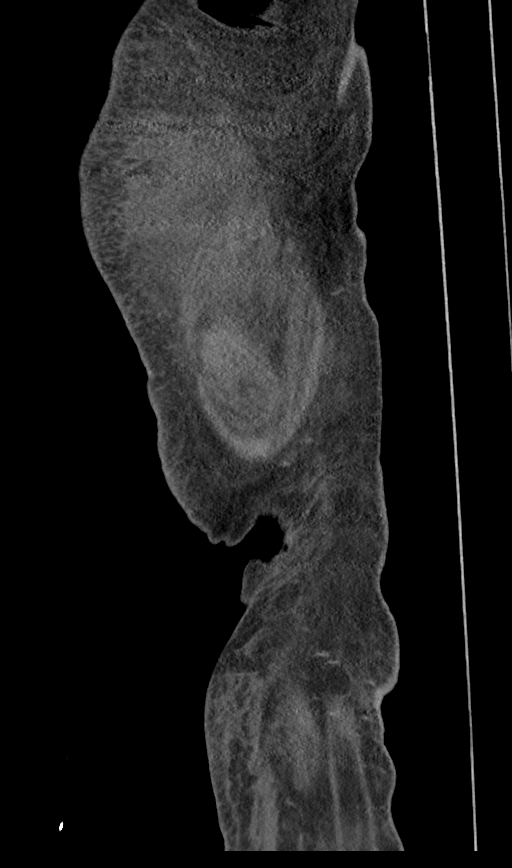
[im 19/193  bone]
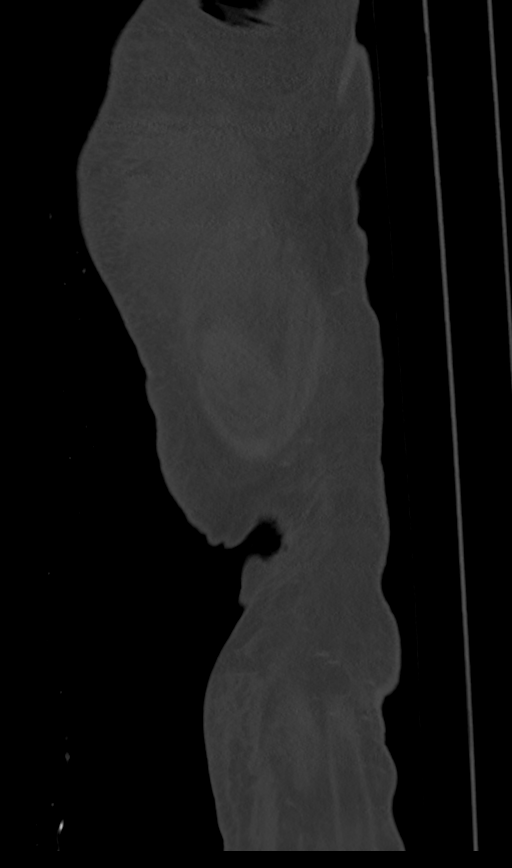
[im 50/193  soft-tissue]
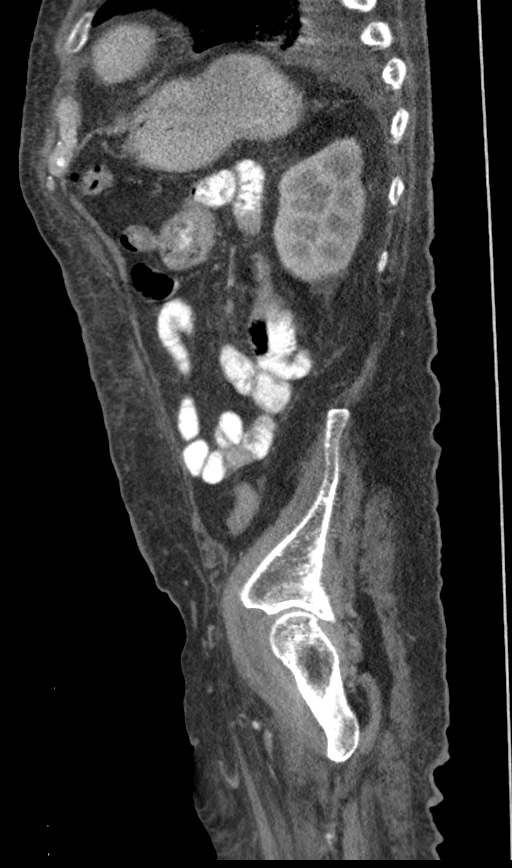
[im 75/193  soft-tissue]
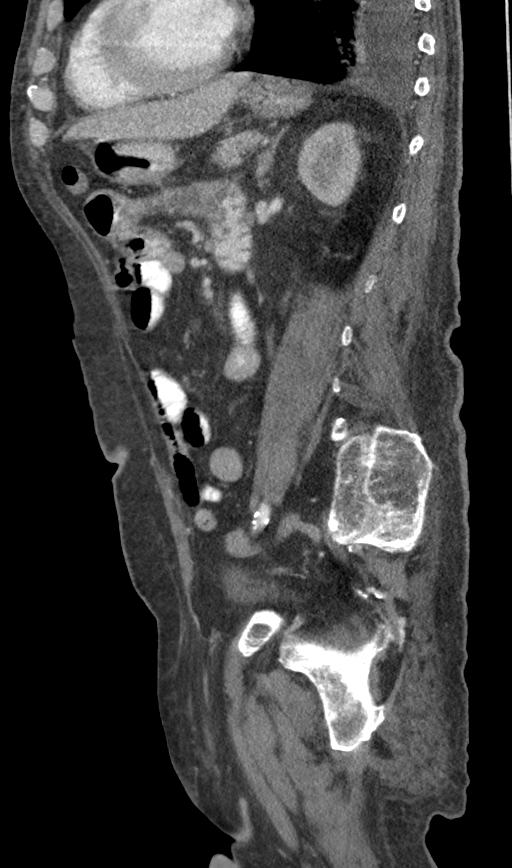
[im 112/193  soft-tissue]
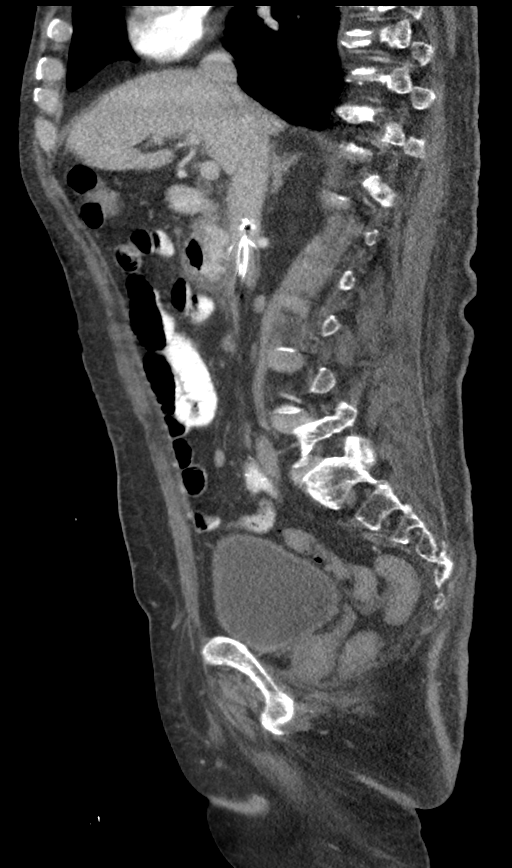
[im 137/193  soft-tissue]
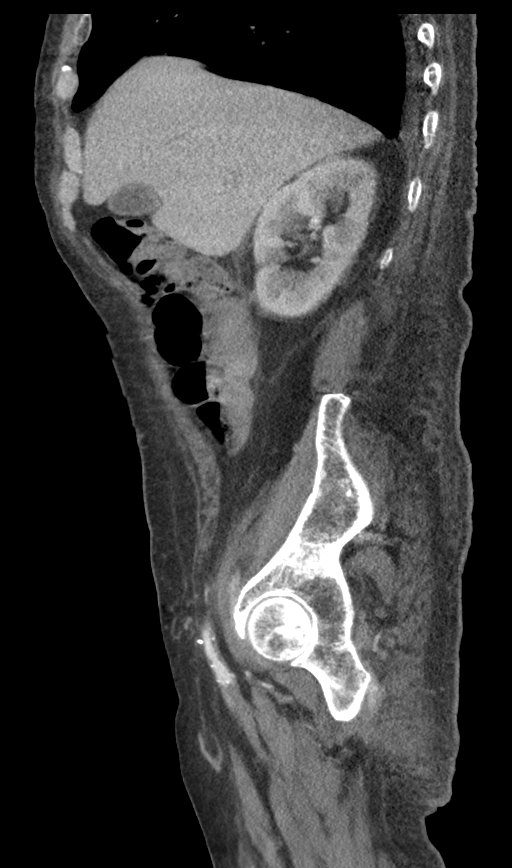
[im 168/193  lung]
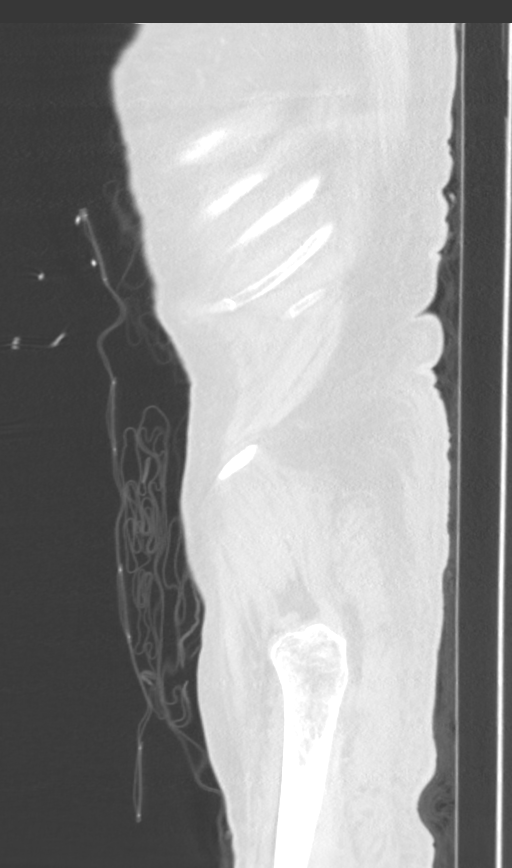
[im 174/193  soft-tissue]
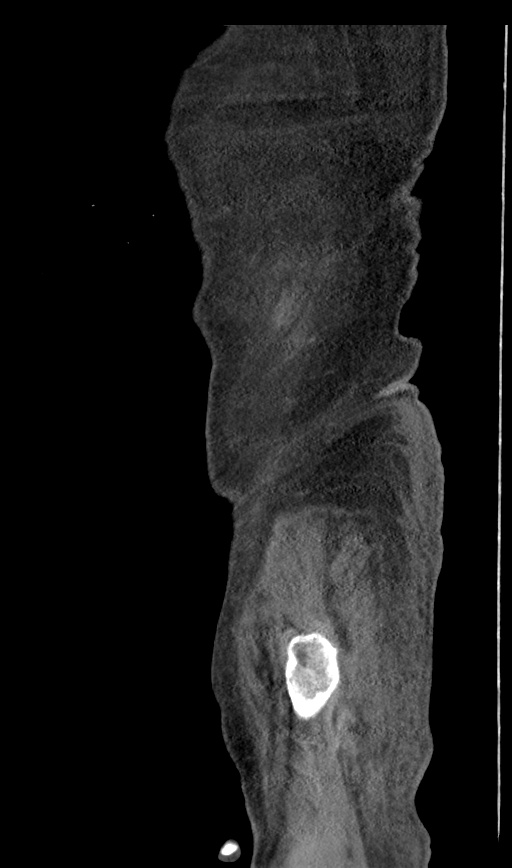
[im 174/193  lung]
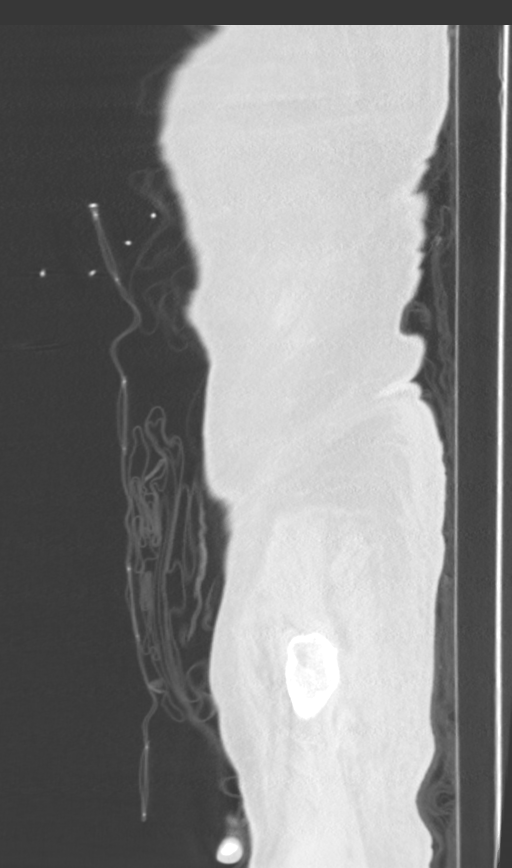
[im 180/193  lung]
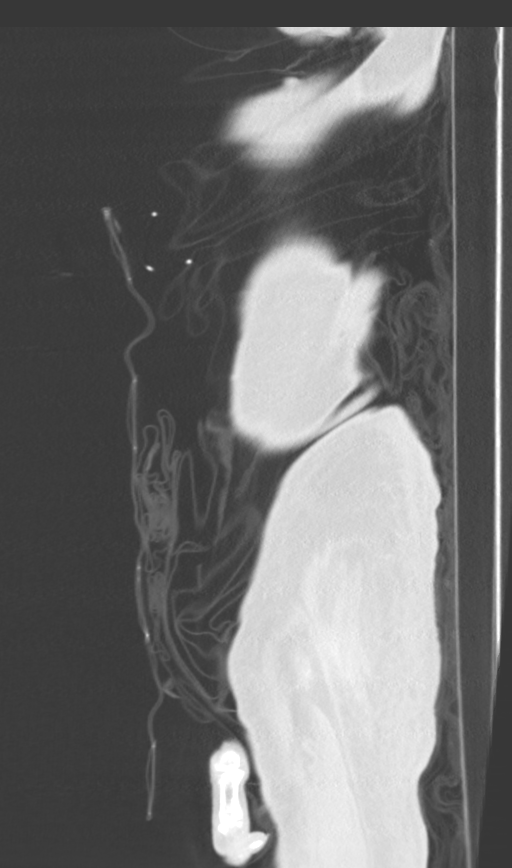
[im 186/193  lung]
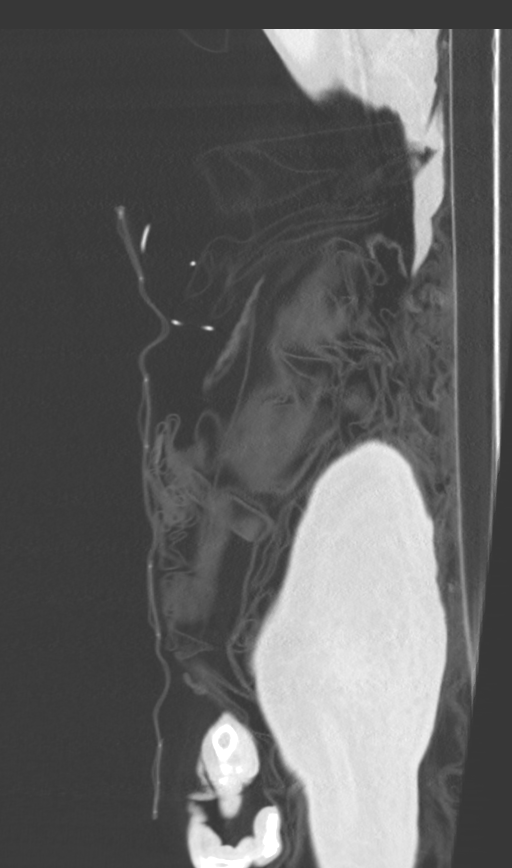

[9 of 35 positions shown; findings below may reference images not displayed]

FINDINGS: Lower chest: Small LEFT pleural effusion. Trace RIGHT pleural
effusion. New irregular consolidative opacity of the lingula. LEFT
basilar atelectasis. Unchanged ground-glass opacity of the RIGHT
lower lobe measuring 7 mm (series 4, image 10). Increasesd
centrilobular ground-glass within the RIGHT middle lobe.

Hepatobiliary: No focal liver abnormality is seen. No gallstones,
gallbladder wall thickening, or biliary dilatation.

Pancreas: Unremarkable. No pancreatic ductal dilatation or
surrounding inflammatory changes.

Spleen: Unchanged appearance of a partially calcified lobulated
spleen in the LEFT upper quadrant.

Adrenals/Urinary Tract: Nodular thickening of the LEFT adrenal
gland, unchanged. RIGHT adrenal gland is unremarkable. No
hydronephrosis. LEFT-sided renal cysts. Bladder stone.

Stomach/Bowel: In the third portion of the duodenum, there is focal
irregular bowel wall thickening anteriorly. It measures
approximately 2.6 x 1.1 cm (series 3, image 35). The folds of the
proximal jejunum are thickened in appearance with several areas of
additional focal irregular bowel wall thickening within the jejunum
(series 3, image 33 cyst with series 6, image 50-60; series 3, image
45 with series 6, image 85). Appendix is normal. No evidence of
bowel obstruction.

Vascular/Lymphatic: Atherosclerotic calcifications of the
nonaneurysmal aorta. IVC filter.

Reproductive: Prostatomegaly with marked hypertrophy of the median
lobe with associated calcification.

Other: Trace free fluid.  No free air.

Musculoskeletal: Osteopenia.  No aggressive osseous lesion.
IMPRESSION: 1. There is irregular bowel wall thickening within the third portion
of the duodenum. In addition, there is irregular bowel wall
thickening in several portions of the proximal jejunum. Findings are
concerning for malignancy with differential considerations including
multifocal small-bowel adenocarcinoma, lymphoma or metastatic
disease from known lung primary.
2. No evidence of bowel obstruction.
3. New irregular consolidation of the lingula and scattered
ground-glass opacities of the RIGHT middle lobe. Findings are likely
infectious or inflammatory in etiology. Recommend attention on
follow-up as per clinical protocol.
4. Small LEFT pleural effusion and trace RIGHT pleural effusion.
Small volume ascites.
5. Prostatomegaly with revisualization of a bladder stone.

Aortic Atherosclerosis ([5G]-[5G]).

## 2020-10-29 SURGERY — ESOPHAGOGASTRODUODENOSCOPY (EGD) WITH PROPOFOL
Anesthesia: Monitor Anesthesia Care

## 2020-10-29 MED ORDER — IOHEXOL 9 MG/ML PO SOLN
ORAL | Status: AC
  Administered 2020-10-29: 500 mL
  Filled 2020-10-29: qty 500

## 2020-10-29 MED ORDER — LACTATED RINGERS IV SOLN
INTRAVENOUS | Status: DC | PRN
Start: 2020-10-29 — End: 2020-10-29

## 2020-10-29 MED ORDER — IOHEXOL 300 MG/ML  SOLN
75.0000 mL | Freq: Once | INTRAMUSCULAR | Status: AC | PRN
  Administered 2020-10-29: 75 mL via INTRAVENOUS

## 2020-10-29 MED ORDER — PROPOFOL 500 MG/50ML IV EMUL
INTRAVENOUS | Status: DC | PRN
  Administered 2020-10-29: 100 ug/kg/min via INTRAVENOUS

## 2020-10-29 MED ORDER — SPOT INK MARKER SYRINGE KIT
PACK | SUBMUCOSAL | Status: DC | PRN
  Administered 2020-10-29: 5 mL via SUBMUCOSAL

## 2020-10-29 MED ORDER — SPOT INK MARKER SYRINGE KIT
PACK | SUBMUCOSAL | Status: AC
  Filled 2020-10-29: qty 5

## 2020-10-29 MED ORDER — PHENYLEPHRINE 40 MCG/ML (10ML) SYRINGE FOR IV PUSH (FOR BLOOD PRESSURE SUPPORT)
PREFILLED_SYRINGE | INTRAVENOUS | Status: DC | PRN
  Administered 2020-10-29 (×2): 80 ug via INTRAVENOUS

## 2020-10-29 SURGICAL SUPPLY — 25 items

## 2020-10-29 NOTE — Transfer of Care (Signed)
Immediate Anesthesia Transfer of Care Note  Patient: Thomas Garcia  Procedure(s) Performed: ESOPHAGOGASTRODUODENOSCOPY (EGD) WITH PROPOFOL COLONOSCOPY WITH PROPOFOL BIOPSY SUBMUCOSAL TATTOO INJECTION  Patient Location: Endoscopy Unit  Anesthesia Type:MAC  Level of Consciousness: awake, alert  and oriented  Airway & Oxygen Therapy: Patient Spontanous Breathing and Patient connected to nasal cannula oxygen  Post-op Assessment: Report given to RN and Post -op Vital signs reviewed and stable  Post vital signs: Reviewed and stable  Last Vitals:  Vitals Value Taken Time  BP 109/43 10/29/20 1021  Temp    Pulse 81 10/29/20 1022  Resp 24 10/29/20 1022  SpO2 99 % 10/29/20 1022  Vitals shown include unvalidated device data.  Last Pain:  Vitals:   10/29/20 0848  TempSrc: Oral  PainSc: 0-No pain      Patients Stated Pain Goal: 0 (60/60/04 5997)  Complications: No notable events documented.

## 2020-10-29 NOTE — Anesthesia Postprocedure Evaluation (Signed)
Anesthesia Post Note  Patient: Thomas Garcia  Procedure(s) Performed: ESOPHAGOGASTRODUODENOSCOPY (EGD) WITH PROPOFOL COLONOSCOPY WITH PROPOFOL BIOPSY SUBMUCOSAL TATTOO INJECTION     Patient location during evaluation: PACU Anesthesia Type: MAC Level of consciousness: awake and alert Pain management: pain level controlled Vital Signs Assessment: post-procedure vital signs reviewed and stable Respiratory status: spontaneous breathing, nonlabored ventilation, respiratory function stable and patient connected to nasal cannula oxygen Cardiovascular status: stable and blood pressure returned to baseline Postop Assessment: no apparent nausea or vomiting Anesthetic complications: no   No notable events documented.  Last Vitals:  Vitals:   10/29/20 1042 10/29/20 1100  BP: (!) 148/56 140/61  Pulse: 83 83  Resp: (!) 23 17  Temp:  36.7 C  SpO2: 94% 96%    Last Pain:  Vitals:   10/29/20 1100  TempSrc: Oral  PainSc:                  Tiajuana Amass

## 2020-10-29 NOTE — Progress Notes (Addendum)
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  Patient was admitted for wound infection after recent surgery. Patient seen and examined at bedside he denies any complaints he underwent EGD and colonoscopy this morning.  Assessment/Plan: Principal Problem:   Wound infection after surgery Active Problems:   Benign essential HTN   Essential hypertension   Diabetes mellitus type 2 in nonobese (HCC)   History of pulmonary embolism   Malignant neoplasm of upper lobe of right lung (HCC)   Anemia   Heme positive stool   Protein-calorie malnutrition, severe    Metastatic lung cancer (adenocarcinoma:) To the brain.  Patient underwent right craniotomy on July 27, 2008.  Anemia: With fecal occult blood positive and dark stool Status post 2unit packed RBC for anemia as low as 6.3 patient underwent EGD and colonoscopy today and he was diagnosed with muscle lesions.  And there is a recommendation for CT restaging and oncology consult by GI. Oncology has been consulted  CT restaging will be ordered  Sepsis due to necrotic nonhealing right BKA Patient completed IV vancomycin that was discontinued and then cefepime and Flagyl  Needed infected right below-knee amputated Stump status post right AKA on October 23, 2020  Status post right AKA.  Staple to be removed in 3 to 4 weeks  Leukocytosis WBC is 13,000 this morning  Code Status: DNR  Severity of Illness: The appropriate patient status for this patient is INPATIENT. Inpatient status is judged to be reasonable and necessary in  order to provide the required intensity of service to ensure the patient's safety. The patient's presenting symptoms, physical exam findings, and initial radiographic and laboratory data in the context of their chronic comorbidities is felt to place them at high risk for further clinical deterioration. Furthermore, it is not anticipated that the patient will be medically stable for discharge from the hospital within 2 midnights of admission. The following factors support the patient status of inpatient.   "   * I certify that at the point of admission it is my clinical judgment that the patient will require inpatient hospital care spanning beyond 2 midnights from the point of admission due to high intensity of service, high risk for further deterioration and high frequency of surveillance required.*   Family Communication: None at bedside  Disposition Plan:  Status is: Inpatient   Dispo: The patient is from: Home              Anticipated d/c is to:               Anticipated d/c date is:              Patient currently not medically stable for discharge  Consultants: Oncology Gastroenterology Neurosurgery Vascular  Procedures: Right AKA October 23, 2020 EGD/colonoscopy July 23/2022   Antimicrobials: None now status post Vancomycin Flagyl Cefepime  DVT prophylaxis: Heparin on hold due to GI bleed.   Patient is bilateral AKA's on SCD   Objective: Vitals:   10/28/20 1737 10/28/20 2201 10/29/20 0450 10/29/20 0848  BP: 113/84 (!) 143/61 (!) 145/62 (!) 145/52  Pulse:  79 74 94 93  Resp: 19 17 18 18   Temp: 98.7 F (37.1 C) 98 F (36.7 C) 97.9 F (36.6 C) 98.3 F (36.8 C)  TempSrc: Oral Oral Oral Oral  SpO2: 95% 95% 95% 95%  Weight:    66.7 kg  Height:    4\' 9"  (1.448 m)    Intake/Output Summary (Last 24 hours) at 10/29/2020 0900 Last data filed at 10/29/2020 0600 Gross per 24 hour  Intake 1440 ml  Output 425 ml  Net 1015 ml   Filed Weights   10/25/20 0508 10/27/20 2110  10/29/20 0848  Weight: 66.7 kg 66.7 kg 66.7 kg   Body mass index is 31.82 kg/m.  Exam:  General: 80 y.o. year-old male well developed well nourished in no acute distress.  Alert and oriented x3. Cardiovascular: Regular rate and rhythm with no rubs or gallops.  No thyromegaly or JVD noted.   Respiratory: Clear to auscultation with no wheezes or rales. Good inspiratory effort. Abdomen: Soft nontender nondistended with normal bowel sounds x4 quadrants. Musculoskeletal: Right AKA and left BKA Skin: No ulcerative lesions noted or rashes, Psychiatry: Mood is appropriate for condition and setting    Data Reviewed: CBC: Recent Labs  Lab 10/24/20 1123 10/25/20 0413 10/25/20 2003 10/26/20 0556 10/27/20 0531 10/28/20 0511  WBC 19.4* 12.3*  --  12.3* 13.3* 12.6*  HGB 7.4* 6.3* 9.1* 10.3* 9.0* 8.1*  HCT 24.5* 21.9* 28.4* 32.0* 28.6* 25.8*  MCV 86.6 89.8  --  85.3 86.7 88.4  PLT 457* 381  --  447* 428* 130*   Basic Metabolic Panel: Recent Labs  Lab 10/23/20 0433 10/24/20 0644 10/25/20 0413 10/26/20 0556 10/27/20 0531 10/29/20 0442  NA 134* 137 139 137 137 138  K 4.0 4.7 3.8 3.7 3.6 3.7  CL 99 105 109 102 105 106  CO2 29 24 23 30 28 26   GLUCOSE 97 114* 110* 125* 102* 125*  BUN 12 12 13 12 8  5*  CREATININE 0.66 0.61 0.49* 0.49* 0.45* 0.36*  CALCIUM 8.4* 8.1* 7.6* 8.0* 8.1* 8.1*  MG 1.9  --   --   --   --   --    GFR: Estimated Creatinine Clearance: 56.5 mL/min (A) (by C-G formula based on SCr of 0.36 mg/dL (L)). Liver Function Tests: No results for input(s): AST, ALT, ALKPHOS, BILITOT, PROT, ALBUMIN in the last 168 hours. No results for input(s): LIPASE, AMYLASE in the last 168 hours. No results for input(s): AMMONIA in the last 168 hours. Coagulation Profile: No results for input(s): INR, PROTIME in the last 168 hours. Cardiac Enzymes: No results for input(s): CKTOTAL, CKMB, CKMBINDEX, TROPONINI in the last 168 hours. BNP (last 3 results) No results for input(s):  PROBNP in the last 8760 hours. HbA1C: No results for input(s): HGBA1C in the last 72 hours. CBG: Recent Labs  Lab 10/28/20 0642 10/28/20 1141 10/28/20 1623 10/28/20 2159 10/29/20 0642  GLUCAP 123* 184* 158* 130* 100*   Lipid Profile: No results for input(s): CHOL, HDL, LDLCALC, TRIG, CHOLHDL, LDLDIRECT in the last 72 hours. Thyroid Function Tests: No results for input(s): TSH, T4TOTAL, FREET4, T3FREE, THYROIDAB in the last 72 hours. Anemia Panel: No results for input(s): VITAMINB12, FOLATE, FERRITIN, TIBC, IRON, RETICCTPCT in the last 72 hours. Urine analysis:    Component Value Date/Time   COLORURINE YELLOW 06/27/2020 0615   APPEARANCEUR CLEAR 06/27/2020 0615   LABSPEC 1.016 06/27/2020 0615   PHURINE 5.0 06/27/2020 0615   GLUCOSEU 50 (A) 06/27/2020 0615   HGBUR  NEGATIVE 06/27/2020 0615   HGBUR trace-intact 10/26/2008 0000   BILIRUBINUR NEGATIVE 06/27/2020 0615   KETONESUR 5 (A) 06/27/2020 0615   PROTEINUR 100 (A) 06/27/2020 0615   UROBILINOGEN 0.2 04/27/2012 1142   NITRITE NEGATIVE 06/27/2020 0615   LEUKOCYTESUR NEGATIVE 06/27/2020 0615   Sepsis Labs: @LABRCNTIP (procalcitonin:4,lacticidven:4)  ) Recent Results (from the past 240 hour(s))  Blood culture (routine x 2)     Status: None   Collection Time: 10/21/20 12:38 PM   Specimen: Left Antecubital; Blood  Result Value Ref Range Status   Specimen Description   Final    LEFT ANTECUBITAL BOTTLES DRAWN AEROBIC AND ANAEROBIC   Special Requests Blood Culture adequate volume  Final   Culture   Final    NO GROWTH 5 DAYS Performed at [facility], [address], [place], [place] [address]    Report Status 10/26/2020 FINAL  Final  Blood culture (routine x 2)     Status: None   Collection Time: 10/21/20  1:52 PM   Specimen: Right Antecubital; Blood  Result Value Ref Range Status   Specimen Description   Final    RIGHT ANTECUBITAL BOTTLES DRAWN AEROBIC AND ANAEROBIC   Special Requests Blood Culture adequate volume   Final   Culture   Final    NO GROWTH 5 DAYS Performed at [facility], [address], [place], [place] [address]    Report Status 10/26/2020 FINAL  Final  Surgical pcr screen     Status: Abnormal   Collection Time: 10/22/20 10:40 PM   Specimen: Nasal Mucosa; Nasal Swab  Result Value Ref Range Status   MRSA, PCR POSITIVE (A) NEGATIVE Final    Comment: RESULT CALLED TO, READ BACK BY AND VERIFIED WITH: E. CASTRO RN, AT [phone] 10/23/20 D. VANHOOK    Staphylococcus aureus POSITIVE (A) NEGATIVE Final    Comment: (NOTE) The Xpert SA Assay (FDA approved for NASAL specimens in patients 78 years of age and older), is one component of a comprehensive surveillance program. It is not intended to diagnose infection nor to guide or monitor treatment. Performed at [facility], [place] [address], [place], [place] [address]       Studies: No results found.  Scheduled Meds:  [MAR Hold] (feeding supplement) PROSource Plus  30 mL Oral BID BM   [MAR Hold] sodium chloride   Intravenous Once   [MAR Hold] atorvastatin  10 mg Oral q1800   [MAR Hold] docusate sodium  100 mg Oral Daily   [MAR Hold] feeding supplement  1 Container Oral TID BM   [MAR Hold] ferrous sulfate  324 mg Oral Q breakfast   [MAR Hold] insulin aspart  0-9 Units Subcutaneous TID WC   [MAR Hold] levETIRAcetam  500 mg Oral BID   [MAR Hold] multivitamin with minerals  1 tablet Oral Daily   [MAR Hold] nicotine  7 mg Transdermal Daily   [MAR Hold] nystatin  5 mL Oral QID   [MAR Hold] oxyCODONE  10 mg Oral Q12H   [MAR Hold] pantoprazole  40 mg Oral Q0600    Continuous Infusions:   LOS: 8 days     Cristal Deer, MD [facility]  To reach me or the doctor on call, go to: [email or web address] Password [email or web address]  10/29/2020, 9:00 AM

## 2020-10-29 NOTE — Anesthesia Procedure Notes (Signed)
Procedure Name: MAC Date/Time: 10/29/2020 9:32 AM Performed by: Kyung Rudd, CRNA Pre-anesthesia Checklist: Patient identified, Emergency Drugs available, Suction available and Patient being monitored Patient Re-evaluated:Patient Re-evaluated prior to induction Oxygen Delivery Method: Nasal cannula Preoxygenation: Pre-oxygenation with 100% oxygen Induction Type: IV induction Placement Confirmation: positive ETCO2

## 2020-10-29 NOTE — Op Note (Signed)
[facility] Patient Name: Thomas Garcia Procedure Date : 10/29/2020 MRN: [id number] Attending MD: Carlota Raspberry. Havery Moros , MD Date of Birth: [birth year] CSN: [id number] Age: 80 Admit Type: Inpatient Procedure:                Colonoscopy Indications:              Heme positive stool, Melena - recurrent anemia in                            need of transfusion. History of lung cancer, no                            prior colonoscopy Providers:                Remo Lipps P. Havery Moros, MD, Jeanella Cara,                            RN, Tyrone Apple, Technician, Clearnce Sorrel, CRNA Referring MD:              Medicines:                Monitored Anesthesia Care Complications:            No immediate complications. Estimated blood loss:                            None. Estimated Blood Loss:     Estimated blood loss: none. Procedure:                Pre-Anesthesia Assessment:                           - Prior to the procedure, a History and Physical                            was performed, and patient medications and                            allergies were reviewed. The patient's tolerance of                            previous anesthesia was also reviewed. The risks                            and benefits of the procedure and the sedation                            options and risks were discussed with the patient.                            All questions were answered, and informed consent                            was obtained. Prior Anticoagulants: The patient has  taken no previous anticoagulant or antiplatelet                            agents. ASA Grade Assessment: IV - A patient with                            severe systemic disease that is a constant threat                            to life. After reviewing the risks and benefits,                            the patient was deemed in satisfactory condition to                             undergo the procedure.                           After obtaining informed consent, the colonoscope                            was passed under direct vision. Throughout the                            procedure, the patient's blood pressure, pulse, and                            oxygen saturations were monitored continuously. The                            [id number] ([id number]) Olympus peds colonoscope was                            introduced through the anus and advanced to the the                            cecum, identified by appendiceal orifice and                            ileocecal valve. The colonoscopy was performed                            without difficulty. The patient tolerated the                            procedure well. The quality of the bowel                            preparation was fair. Scope In: 9:59:41 AM Scope Out: 10:13:17 AM Scope Withdrawal Time: 0 hours 9 minutes 56 seconds  Total Procedure Duration: 0 hours 13 minutes 36 seconds  Findings:      The perianal and digital rectal examinations were normal.      A large amount of semi-liquid stool was found in the  entire colon,       making visualization difficult. Lavage of the colon was performed using       copious amounts of sterile water, resulting in clearance with fair       visualization. Worst in right colon      Two sessile polyps were found in the transverse colon. The polyps were 5       to 10 mm in size.      A 5 mm polyp was found in the descending colon. The polyp was sessile.      A 4 mm polyp was found in the sigmoid colon. The polyp was sessile.      A few small-mouthed diverticula were found in the sigmoid colon.      The exam was otherwise without abnormality. Prep limited some views but       no obvious mass lesions or high risk pathology noted. Impression:               - Preparation of the colon was fair.                           - Stool in the entire examined colon.                            - Two 5 to 10 mm polyps in the transverse colon.                           - One 5 mm polyp in the descending colon.                           - One 4 mm polyp in the sigmoid colon.                           - Diverticulosis in the sigmoid colon.                           - The examination was otherwise normal.                           - No obvious mass lesions.                           Small bowel mass lesions are cause for the anemia,                            no colonic involvement appreciated. Polyps not                            removed given findings on EGD, unlikely to have                            clinical impact and would put him at risk for post                            polypectomy bleeding Recommendation:           - Return patient to  hospital ward for ongoing care.                           - Advance diet as tolerated.                           - Continue present medications.                           - Recommendations per EGD note Procedure Code(s):        --- Professional ---                           [phone], Colonoscopy, flexible; diagnostic, including                            collection of specimen(s) by brushing or washing,                            when performed (separate procedure) Diagnosis Code(s):        --- Professional ---                           K63.5, Polyp of colon                           R19.5, Other fecal abnormalities                           K92.1, Melena (includes Hematochezia)                           K57.30, Diverticulosis of large intestine without                            perforation or abscess without bleeding CPT copyright 2019 American [employer]. All rights reserved. The codes documented in this report are preliminary and upon coder review may  be revised to meet current compliance requirements. Remo Lipps P. Thomas Thielke, MD 10/29/2020 10:40:01 AM This report has been signed electronically. Number of Addenda: 0

## 2020-10-29 NOTE — Interval H&P Note (Signed)
History and Physical Interval Note: Here for EGD and colonoscopy today to evaluate anemia / heme positive stools. I have discussed risks / benefits of EGD and colonoscopy with him and he wishes to proceed. Further recommendations pending the results.   10/29/2020 9:14 AM  Thomas Garcia  has presented today for surgery, with the diagnosis of anemia, heme positive stool.  The various methods of treatment have been discussed with the patient and family. After consideration of risks, benefits and other options for treatment, the patient has consented to  Procedure(s): ESOPHAGOGASTRODUODENOSCOPY (EGD) WITH PROPOFOL (N/A) COLONOSCOPY WITH PROPOFOL (N/A) as a surgical intervention.  The patient's history has been reviewed, patient examined, no change in status, stable for surgery.  I have reviewed the patient's chart and labs.  Questions were answered to the patient's satisfaction.     [place]

## 2020-10-29 NOTE — Anesthesia Preprocedure Evaluation (Signed)
Anesthesia Evaluation  Patient identified by MRN, date of birth, ID band Patient awake    Reviewed: Allergy & Precautions, NPO status , Patient's Chart, lab work & pertinent test results  Airway Mallampati: II  TM Distance: >3 FB Neck ROM: Full    Dental  (+) Dental Advisory Given   Pulmonary Patient abstained from smoking., former smoker,    breath sounds clear to auscultation       Cardiovascular hypertension, Pt. on medications + Peripheral Vascular Disease   Rhythm:Regular Rate:Normal     Neuro/Psych  Neuromuscular disease    GI/Hepatic Neg liver ROS, GI bleed   Endo/Other  diabetes, Type 2  Renal/GU negative Renal ROS     Musculoskeletal   Abdominal   Peds  Hematology  (+) anemia ,   Anesthesia Other Findings   Reproductive/Obstetrics                             Anesthesia Physical Anesthesia Plan  ASA: 4  Anesthesia Plan: MAC   Post-op Pain Management:    Induction:   PONV Risk Score and Plan: 1 and Propofol infusion and Treatment may vary due to age or medical condition  Airway Management Planned: Natural Airway, Nasal Cannula and Simple Face Mask  Additional Equipment:   Intra-op Plan:   Post-operative Plan:   Informed Consent: I have reviewed the patients History and Physical, chart, labs and discussed the procedure including the risks, benefits and alternatives for the proposed anesthesia with the patient or authorized representative who has indicated his/her understanding and acceptance.       Plan Discussed with:   Anesthesia Plan Comments:         Anesthesia Quick Evaluation

## 2020-10-29 NOTE — Op Note (Signed)
[facility] Patient Name: Thomas Garcia Procedure Date : 10/29/2020 MRN: [id number] Attending MD: Carlota Raspberry. Havery Moros , MD Date of Birth: 01-07-1941 CSN: [id number] Age: 80 Admit Type: Inpatient Procedure:                Upper GI endoscopy Indications:              Heme positive stool, Melena, recurrent anemia.                            History of lung cancer Providers:                Carlota Raspberry. Havery Moros, MD, Jeanella Cara,                            RN, Tyrone Apple, Technician, Rhae Lerner, CRNA Referring MD:              Medicines:                Monitored Anesthesia Care Complications:            No immediate complications. Estimated blood loss:                            Minimal. Estimated Blood Loss:     Estimated blood loss was minimal. Procedure:                Pre-Anesthesia Assessment:                           - Prior to the procedure, a History and Physical                            was performed, and patient medications and                            allergies were reviewed. The patient's tolerance of                            previous anesthesia was also reviewed. The risks                            and benefits of the procedure and the sedation                            options and risks were discussed with the patient.                            All questions were answered, and informed consent                            was obtained. Prior Anticoagulants: The patient has                            taken no previous anticoagulant or antiplatelet  agents. ASA Grade Assessment: IV - A patient with                            severe systemic disease that is a constant threat                            to life. After reviewing the risks and benefits,                            the patient was deemed in satisfactory condition to                            undergo the procedure.                           After  obtaining informed consent, the endoscope was                            passed under direct vision. Throughout the                            procedure, the patient's blood pressure, pulse, and                            oxygen saturations were monitored continuously. The                            [id number] ([id number]) Olympus endoscope was introduced                            through the mouth, and advanced to the second part                            of duodenum. The upper GI endoscopy was                            accomplished without difficulty. The patient                            tolerated the procedure well. Scope In: Scope Out: Findings:      Esophagogastric landmarks were identified: the Z-line was found at 34       cm, the gastroesophageal junction was found at 34 cm and the upper       extent of the gastric folds was found at 36 cm from the incisors.      A 2 cm hiatal hernia was present.      The exam of the esophagus was otherwise normal.      A single diminutive angiodysplastic lesion with no bleeding was found in       the gastric body.      The exam of the stomach was otherwise normal.      A large ulcerated vascular appearing mass lesion with stigmata of recent       bleeding was found in the second portion of the duodenum. Biopsies were  taken with a cold forceps for histology. Area was tattooed with an       injection of Spot (carbon black). A smaller mass lesion with ulceration       was noted proximal to this in the duodenal sweep      A medium-sized ulcerated vacular appearing mass with stigmata of recent       bleeding was found in the third portion of the duodenum. Biopsies were       taken with a cold forceps for histology. Area was tattooed with an       injection of Spot (carbon black).      There was a diminutive AVM in the duodenal bulb. The exam of the       duodenum was otherwise normal. Impression:               - Esophagogastric landmarks  identified.                           - 2 cm hiatal hernia.                           - Normal esophagus otherwise                           - A single non-bleeding angiodysplastic lesion in                            the stomach.                           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                            Biopsied. Tattooed.                           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 Biopsied. Tattooed.                           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 Recommendation:           - Return patient to hospital ward for ongoing care.                           - Clear liquid diet, advance as tolerated.                           - Continue present medications.                           - Await pathology results.                           - CT chest abdomen pelvis to restage malignancy                           - Oncology consultation                           - Will discuss findings with pirmary team, patient,                            and his family Procedure Code(s):        --- Professional ---                           [phone], Esophagogastroduodenoscopy, flexible,                            transoral; with biopsy, single or multiple                           43236, 29, Esophagogastroduodenoscopy, flexible,                            transoral; with directed submucosal injection(s),                            any substance Diagnosis Code(s):        --- Professional ---                           K44.9, Diaphragmatic hernia without obstruction  or                            gangrene                           K31.819, Angiodysplasia of stomach and duodenum                            without bleeding                           K31.89, Other diseases of stomach and duodenum  R19.5, Other fecal abnormalities                           K92.1, Melena (includes Hematochezia) CPT copyright 2019 American Medical Association. All rights reserved. The codes documented in this report are preliminary and upon coder review may  be revised to meet current compliance requirements. Remo Lipps P. Saisha Hogue, MD 10/29/2020 10:33:02 AM This report has been signed electronically. Number of Addenda: 0

## 2020-10-30 DIAGNOSIS — D649 Anemia, unspecified: Secondary | ICD-10-CM | POA: Diagnosis not present

## 2020-10-30 DIAGNOSIS — K6389 Other specified diseases of intestine: Secondary | ICD-10-CM | POA: Diagnosis not present

## 2020-10-30 DIAGNOSIS — R195 Other fecal abnormalities: Secondary | ICD-10-CM | POA: Diagnosis not present

## 2020-10-30 LAB — CBC
HCT: 25.5 % — ABNORMAL LOW (ref 39.0–52.0)
Hemoglobin: 8 g/dL — ABNORMAL LOW (ref 13.0–17.0)
MCH: 27.7 pg (ref 26.0–34.0)
MCHC: 31.4 g/dL (ref 30.0–36.0)
MCV: 88.2 fL (ref 80.0–100.0)
Platelets: 419 10*3/uL — ABNORMAL HIGH (ref 150–400)
RBC: 2.89 MIL/uL — ABNORMAL LOW (ref 4.22–5.81)
RDW: 20.6 % — ABNORMAL HIGH (ref 11.5–15.5)
WBC: 17.1 10*3/uL — ABNORMAL HIGH (ref 4.0–10.5)
nRBC: 0 % (ref 0.0–0.2)

## 2020-10-30 LAB — GLUCOSE, CAPILLARY
Glucose-Capillary: 119 mg/dL — ABNORMAL HIGH (ref 70–99)
Glucose-Capillary: 120 mg/dL — ABNORMAL HIGH (ref 70–99)
Glucose-Capillary: 134 mg/dL — ABNORMAL HIGH (ref 70–99)
Glucose-Capillary: 195 mg/dL — ABNORMAL HIGH (ref 70–99)

## 2020-10-30 NOTE — Progress Notes (Signed)
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  Pathology pending. Will see patient tomorrow when pathology available. Plz consider CT chest if possible to complete staging.  Sullivan Lone MD

## 2020-10-30 NOTE — Progress Notes (Signed)
Progress Note   Subjective  Patient feeling okay, no bleeding reports. Hgb relatively stable. EGD / colonoscopy done yesterday. CT done as below   Objective   Vital signs in last 24 hours: Temp:  [97.5 F (36.4 C)-98.8 F (37.1 C)] 97.5 F (36.4 C) (07/24 0953) Pulse Rate:  [80-90] 90 (07/24 0953) Resp:  [18-20] 19 (07/24 0953) BP: (103-134)/(50-60) 103/50 (07/24 0953) SpO2:  [90 %-94 %] 90 % (07/24 0953) Last BM Date: 10/29/20 General:    male in NAD Abdomen:  Soft, nontender and nondistended.  Neurologic:  Alert and oriented,  grossly normal neurologically. Psych:  Cooperative. Normal mood and affect.  Intake/Output from previous day: 07/23 0701 - 07/24 0700 In: 300 [I.V.:300] Out: 1600 [Urine:1600] Intake/Output this shift: Total I/O In: 120 [P.O.:120] Out: -   Lab Results: Recent Labs    10/28/20 0511 10/30/20 0542  WBC 12.6* 17.1*  HGB 8.1* 8.0*  HCT 25.8* 25.5*  PLT 416* 419*   BMET Recent Labs    10/29/20 0442  NA 138  K 3.7  CL 106  CO2 26  GLUCOSE 125*  BUN 5*  CREATININE 0.36*  CALCIUM 8.1*   LFT No results for input(s): PROT, ALBUMIN, AST, ALT, ALKPHOS, BILITOT, BILIDIR, IBILI in the last 72 hours. PT/INR No results for input(s): LABPROT, INR in the last 72 hours.  Studies/Results: CT ABDOMEN PELVIS W CONTRAST  Result Date: 10/29/2020 CLINICAL DATA:  Metastatic disease evaluation EXAM: CT ABDOMEN AND PELVIS WITH CONTRAST TECHNIQUE: Multidetector CT imaging of the abdomen and pelvis was performed using the standard protocol following bolus administration of intravenous contrast. CONTRAST:  36mL OMNIPAQUE IOHEXOL 300 MG/ML  SOLN COMPARISON:  June 27, 2020 FINDINGS: Lower chest: Small LEFT pleural effusion. Trace RIGHT pleural effusion. New irregular consolidative opacity of the lingula. LEFT basilar atelectasis. Unchanged ground-glass opacity of the RIGHT lower lobe measuring 7 mm (series 4, image 10). Increasesd centrilobular  ground-glass within the RIGHT middle lobe. Hepatobiliary: No focal liver abnormality is seen. No gallstones, gallbladder wall thickening, or biliary dilatation. Pancreas: Unremarkable. No pancreatic ductal dilatation or surrounding inflammatory changes. Spleen: Unchanged appearance of a partially calcified lobulated spleen in the LEFT upper quadrant. Adrenals/Urinary Tract: Nodular thickening of the LEFT adrenal gland, unchanged. RIGHT adrenal gland is unremarkable. No hydronephrosis. LEFT-sided renal cysts. Bladder stone. Stomach/Bowel: In the third portion of the duodenum, there is focal irregular bowel wall thickening anteriorly. It measures approximately 2.6 x 1.1 cm (series 3, image 35). The folds of the proximal jejunum are thickened in appearance with several areas of additional focal irregular bowel wall thickening within the jejunum (series 3, image 33 cyst with series 6, image 50-60; series 3, image 45 with series 6, image 85). Appendix is normal. No evidence of bowel obstruction. Vascular/Lymphatic: Atherosclerotic calcifications of the nonaneurysmal aorta. IVC filter. Reproductive: Prostatomegaly with marked hypertrophy of the median lobe with associated calcification. Other: Trace free fluid.  No free air. Musculoskeletal: Osteopenia.  No aggressive osseous lesion. IMPRESSION: 1. There is irregular bowel wall thickening within the third portion of the duodenum. In addition, there is irregular bowel wall thickening in several portions of the proximal jejunum. Findings are concerning for malignancy with differential considerations including multifocal small-bowel adenocarcinoma, lymphoma or metastatic disease from known lung primary. 2. No evidence of bowel obstruction. 3. New irregular consolidation of the lingula and scattered ground-glass opacities of the RIGHT middle lobe. Findings are likely infectious or inflammatory in etiology. Recommend attention on follow-up as per  clinical protocol. 4. Small  LEFT pleural effusion and trace RIGHT pleural effusion. Small volume ascites. 5. Prostatomegaly with revisualization of a bladder stone. Aortic Atherosclerosis (ICD10-I70.0). 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 EGD and colonoscopy done yesterday. He had 3 mass lesions in his proximal small bowel - small implant duodenal sweep, masses noted in second and third portion. Biopsies done. Colonoscopy without any mass lesions or high risk lesions. Some small polyps noted but not removed due to bleeding risk and that they will have no clinical impact on his course. Follow up CT scan done as outlined. He has abnormalities in the duodenum and jejunum concerning for multiple sites of malignancy, concern for metastatic disease. Spoke with the patient and his brother today about the findings, that are very concerning. Awaiting pathology results and then oncology will provide recommendations on options. These sites are not amenable to endoscopic therapy. Prognosis quite poor given all of his medical problems.  Will see what final path shows and await oncology recommendations. Nothing further to add from GI perspective.   We will sign off for now, please call with questions.  Jolly Mango, MD [facility]

## 2020-10-30 NOTE — Progress Notes (Signed)
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  Patient was admitted for wound infection after recent surgery. Patient seen and examined at bedside he denies any complaints he underwent EGD and colonoscopy this morning.  October 30, 2020: Patient seen and examined at bedside Patient denies any complaints stated that his blood platelets are good today brother states.  He told me he was not seen oncology prior to coming to the hospital.  He also told me that the he will be cancer went into his brain and that they do surgery and they have was told to have remove the cancer of the scalp.  I advised him abdominal intestinal findings possible metastasis.  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  Patient underwent right craniotomy on July 27, 2008.  Anemia: With fecal occult blood positive and dark stool Status post 2unit packed RBC for anemia as low as 6.3 patient underwent EGD and colonoscopy today and he was diagnosed with muscle lesions.  And there is a recommendation for CT restaging and oncology consult by GI. Oncology has been consulted  CT restaging will be ordered  Sepsis due to  necrotic nonhealing right BKA Patient completed IV vancomycin that was discontinued and then cefepime and Flagyl  Needed infected right below-knee amputated Stump status post right AKA on October 23, 2020  Status post right AKA.  Staple to be removed in 3 to 4 weeks  Leukocytosis WBC is 13,000 this morning  Code Status: DNR  Severity of Illness: The appropriate patient status for this patient is INPATIENT. Inpatient status is judged to be reasonable and necessary in order to provide the required intensity of service to ensure the patient's safety. The patient's presenting symptoms, physical exam findings, and initial radiographic and laboratory data in the context of their chronic comorbidities is felt to place them at high risk for further clinical deterioration. Furthermore, it is not anticipated that the patient will be medically stable for discharge from the hospital within 2 midnights of admission. The following factors support the patient status of inpatient.   " Being worked up new finding of possible metastasis   * I certify that at the point of admission it is my clinical judgment that the patient will require inpatient hospital care spanning beyond 2 midnights from the point of admission due to high intensity of service, high risk for further deterioration and high frequency of surveillance required.*   Family Communication: None at bedside  Disposition Plan:  Status is: Inpatient   Dispo: The patient is from: Home              Anticipated d/c is to:  Anticipated d/c date is:              Patient currently not medically stable for discharge  Consultants: Oncology Gastroenterology Neurosurgery Vascular  Procedures: Right AKA October 23, 2020 EGD/colonoscopy July 23/2022   Antimicrobials: None now status post Vancomycin Flagyl Cefepime  DVT prophylaxis: Heparin on hold due to GI bleed.   Patient is bilateral AKA's on SCD   Objective: Vitals:    10/29/20 1836 10/29/20 2152 10/30/20 0507 10/30/20 0953  BP: 125/60 (!) 127/57 (!) 134/56 (!) 103/50  Pulse: 80 84 84 90  Resp: 18 18 20 19   Temp: 98.3 F (36.8 C) 98.8 F (37.1 C) 98.2 F (36.8 C) (!) 97.5 F (36.4 C)  TempSrc: Oral Oral Oral Oral  SpO2: 94% 94% 93% 90%  Weight:      Height:        Intake/Output Summary (Last 24 hours) at 10/30/2020 1247 Last data filed at 10/30/2020 1244 Gross per 24 hour  Intake 120 ml  Output 1600 ml  Net -1480 ml    Filed Weights   10/25/20 0508 10/27/20 2110 10/29/20 0848  Weight: 66.7 kg 66.7 kg 66.7 kg   Body mass index is 31.82 kg/m.  Exam:  General: 80 y.o. year-old male well developed well nourished in no acute distress.  Alert and oriented x3. Cardiovascular: Regular rate and rhythm with no rubs or gallops.  No thyromegaly or JVD noted.   Respiratory: Clear to auscultation with no wheezes or rales. Good inspiratory effort. Abdomen: Soft nontender nondistended with normal bowel sounds x4 quadrants. Musculoskeletal: Right AKA and left BKA Skin: No ulcerative lesions noted or rashes, Psychiatry: Mood is appropriate for condition and setting    Data Reviewed: CBC: Recent Labs  Lab 10/25/20 0413 10/25/20 2003 10/26/20 0556 10/27/20 0531 10/28/20 0511 10/30/20 0542  WBC 12.3*  --  12.3* 13.3* 12.6* 17.1*  HGB 6.3* 9.1* 10.3* 9.0* 8.1* 8.0*  HCT 21.9* 28.4* 32.0* 28.6* 25.8* 25.5*  MCV 89.8  --  85.3 86.7 88.4 88.2  PLT 381  --  447* 428* 416* 419*    Basic Metabolic Panel: Recent Labs  Lab 10/24/20 0644 10/25/20 0413 10/26/20 0556 10/27/20 0531 10/29/20 0442  NA 137 139 137 137 138  K 4.7 3.8 3.7 3.6 3.7  CL 105 109 102 105 106  CO2 24 23 30 28 26   GLUCOSE 114* 110* 125* 102* 125*  BUN 12 13 12 8  5*  CREATININE 0.61 0.49* 0.49* 0.45* 0.36*  CALCIUM 8.1* 7.6* 8.0* 8.1* 8.1*    GFR: Estimated Creatinine Clearance: 56.5 mL/min (A) (by C-G formula based on SCr of 0.36 mg/dL (L)). Liver Function Tests: No  results for input(s): AST, ALT, ALKPHOS, BILITOT, PROT, ALBUMIN in the last 168 hours. No results for input(s): LIPASE, AMYLASE in the last 168 hours. No results for input(s): AMMONIA in the last 168 hours. Coagulation Profile: No results for input(s): INR, PROTIME in the last 168 hours. Cardiac Enzymes: No results for input(s): CKTOTAL, CKMB, CKMBINDEX, TROPONINI in the last 168 hours. BNP (last 3 results) No results for input(s): PROBNP in the last 8760 hours. HbA1C: No results for input(s): HGBA1C in the last 72 hours. CBG: Recent Labs  Lab 10/29/20 1126 10/29/20 1620 10/29/20 2155 10/30/20 0627 10/30/20 1124  GLUCAP 109* 174* 99 119* 120*    Lipid Profile: No results for input(s): CHOL, HDL, LDLCALC, TRIG, CHOLHDL, LDLDIRECT in the last 72 hours. Thyroid Function Tests: No results for input(s): TSH, T4TOTAL, FREET4,  T3FREE, THYROIDAB in the last 72 hours. Anemia Panel: No results for input(s): VITAMINB12, FOLATE, FERRITIN, TIBC, IRON, RETICCTPCT in the last 72 hours. Urine analysis:    Component Value Date/Time   COLORURINE YELLOW 06/27/2020 0615   APPEARANCEUR CLEAR 06/27/2020 0615   LABSPEC 1.016 06/27/2020 0615   PHURINE 5.0 06/27/2020 0615   GLUCOSEU 50 (A) 06/27/2020 0615   HGBUR NEGATIVE 06/27/2020 0615   HGBUR trace-intact 10/26/2008 0000   BILIRUBINUR NEGATIVE 06/27/2020 0615   KETONESUR 5 (A) 06/27/2020 0615   PROTEINUR 100 (A) 06/27/2020 0615   UROBILINOGEN 0.2 04/27/2012 1142   NITRITE NEGATIVE 06/27/2020 0615   LEUKOCYTESUR NEGATIVE 06/27/2020 0615   Sepsis Labs: @LABRCNTIP (procalcitonin:4,lacticidven:4)  ) Recent Results (from the past 240 hour(s))  Blood culture (routine x 2)     Status: None   Collection Time: 10/21/20 12:38 PM   Specimen: Left Antecubital; Blood  Result Value Ref Range Status   Specimen Description   Final    LEFT ANTECUBITAL BOTTLES DRAWN AEROBIC AND ANAEROBIC   Special Requests Blood Culture adequate volume  Final    Culture   Final    NO GROWTH 5 DAYS Performed at [facility], [address] [place], [place] [address]    Report Status 10/26/2020 FINAL  Final  Blood culture (routine x 2)     Status: None   Collection Time: 10/21/20  1:52 PM   Specimen: Right Antecubital; Blood  Result Value Ref Range Status   Specimen Description   Final    RIGHT ANTECUBITAL BOTTLES DRAWN AEROBIC AND ANAEROBIC   Special Requests Blood Culture adequate volume  Final   Culture   Final    NO GROWTH 5 DAYS Performed at [facility], [address], [place], [place] [address]    Report Status 10/26/2020 FINAL  Final  Surgical pcr screen     Status: Abnormal   Collection Time: 10/22/20 10:40 PM   Specimen: Nasal Mucosa; Nasal Swab  Result Value Ref Range Status   MRSA, PCR POSITIVE (A) NEGATIVE Final    Comment: RESULT CALLED TO, READ BACK BY AND VERIFIED WITH: E. CASTRO RN, AT [phone] 10/23/20 D. VANHOOK    Staphylococcus aureus POSITIVE (A) NEGATIVE Final    Comment: (NOTE) The Xpert SA Assay (FDA approved for NASAL specimens in patients 61 years of age and older), is one component of a comprehensive surveillance program. It is not intended to diagnose infection nor to guide or monitor treatment. Performed at [facility], [place] [address], [place], [place] [address]       Studies: CT ABDOMEN PELVIS W CONTRAST  Result Date: 10/29/2020 CLINICAL DATA:  Metastatic disease evaluation EXAM: CT ABDOMEN AND PELVIS WITH CONTRAST TECHNIQUE: Multidetector CT imaging of the abdomen and pelvis was performed using the standard protocol following bolus administration of intravenous contrast. CONTRAST:  34mL OMNIPAQUE IOHEXOL 300 MG/ML  SOLN COMPARISON:  June 27, 2020 FINDINGS: Lower chest: Small LEFT pleural effusion. Trace RIGHT pleural effusion. New irregular consolidative opacity of the lingula. LEFT basilar atelectasis. Unchanged ground-glass opacity of the RIGHT lower lobe measuring 7 mm (series 4, image 10).  Increasesd centrilobular ground-glass within the RIGHT middle lobe. Hepatobiliary: No focal liver abnormality is seen. No gallstones, gallbladder wall thickening, or biliary dilatation. Pancreas: Unremarkable. No pancreatic ductal dilatation or surrounding inflammatory changes. Spleen: Unchanged appearance of a partially calcified lobulated spleen in the LEFT upper quadrant. Adrenals/Urinary Tract: Nodular thickening of the LEFT adrenal gland, unchanged. RIGHT adrenal gland is unremarkable. No hydronephrosis.  LEFT-sided renal cysts. Bladder stone. Stomach/Bowel: In the third portion of the duodenum, there is focal irregular bowel wall thickening anteriorly. It measures approximately 2.6 x 1.1 cm (series 3, image 35). The folds of the proximal jejunum are thickened in appearance with several areas of additional focal irregular bowel wall thickening within the jejunum (series 3, image 33 cyst with series 6, image 50-60; series 3, image 45 with series 6, image 85). Appendix is normal. No evidence of bowel obstruction. Vascular/Lymphatic: Atherosclerotic calcifications of the nonaneurysmal aorta. IVC filter. Reproductive: Prostatomegaly with marked hypertrophy of the median lobe with associated calcification. Other: Trace free fluid.  No free air. Musculoskeletal: Osteopenia.  No aggressive osseous lesion. IMPRESSION: 1. There is irregular bowel wall thickening within the third portion of the duodenum. In addition, there is irregular bowel wall thickening in several portions of the proximal jejunum. Findings are concerning for malignancy with differential considerations including multifocal small-bowel adenocarcinoma, lymphoma or metastatic disease from known lung primary. 2. No evidence of bowel obstruction. 3. New irregular consolidation of the lingula and scattered ground-glass opacities of the RIGHT middle lobe. Findings are likely infectious or inflammatory in etiology. Recommend attention on follow-up as per  clinical protocol. 4. Small LEFT pleural effusion and trace RIGHT pleural effusion. Small volume ascites. 5. Prostatomegaly with revisualization of a bladder stone. Aortic Atherosclerosis (ICD10-I70.0). Electronically Signed   By: Valentino Saxon MD   On: 10/29/2020 20:08    Scheduled Meds:  (feeding supplement) PROSource Plus  30 mL Oral BID BM   sodium chloride   Intravenous Once   atorvastatin  10 mg Oral q1800   docusate sodium  100 mg Oral Daily   feeding supplement  1 Container Oral TID BM   ferrous sulfate  324 mg Oral Q breakfast   insulin aspart  0-9 Units Subcutaneous TID WC   levETIRAcetam  500 mg Oral BID   multivitamin with minerals  1 tablet Oral Daily   nicotine  7 mg Transdermal Daily   nystatin  5 mL Oral QID   oxyCODONE  10 mg Oral Q12H   pantoprazole  40 mg Oral Q0600    Continuous Infusions:   LOS: 9 days     Cristal Deer, MD [facility]  To reach me or the doctor on call, go to: [email or web address] Password [email or web address]  10/30/2020, 12:47 PM

## 2020-10-31 ENCOUNTER — Telehealth (INDEPENDENT_AMBULATORY_CARE_PROVIDER_SITE_OTHER): Payer: Self-pay

## 2020-10-31 ENCOUNTER — Inpatient Hospital Stay (HOSPITAL_COMMUNITY): Payer: Medicare HMO

## 2020-10-31 ENCOUNTER — Encounter (HOSPITAL_COMMUNITY): Payer: Self-pay | Admitting: Family Medicine

## 2020-10-31 DIAGNOSIS — I70221 Atherosclerosis of native arteries of extremities with rest pain, right leg: Secondary | ICD-10-CM | POA: Diagnosis not present

## 2020-10-31 DIAGNOSIS — Z89511 Acquired absence of right leg below knee: Secondary | ICD-10-CM | POA: Diagnosis not present

## 2020-10-31 DIAGNOSIS — C7931 Secondary malignant neoplasm of brain: Secondary | ICD-10-CM | POA: Diagnosis not present

## 2020-10-31 DIAGNOSIS — Z4781 Encounter for orthopedic aftercare following surgical amputation: Secondary | ICD-10-CM | POA: Diagnosis not present

## 2020-10-31 DIAGNOSIS — C3411 Malignant neoplasm of upper lobe, right bronchus or lung: Secondary | ICD-10-CM | POA: Diagnosis not present

## 2020-10-31 DIAGNOSIS — E1151 Type 2 diabetes mellitus with diabetic peripheral angiopathy without gangrene: Secondary | ICD-10-CM | POA: Diagnosis not present

## 2020-10-31 DIAGNOSIS — T8149XA Infection following a procedure, other surgical site, initial encounter: Secondary | ICD-10-CM | POA: Diagnosis not present

## 2020-10-31 DIAGNOSIS — I10 Essential (primary) hypertension: Secondary | ICD-10-CM | POA: Diagnosis not present

## 2020-10-31 DIAGNOSIS — D630 Anemia in neoplastic disease: Secondary | ICD-10-CM | POA: Diagnosis not present

## 2020-10-31 DIAGNOSIS — D509 Iron deficiency anemia, unspecified: Secondary | ICD-10-CM | POA: Diagnosis not present

## 2020-10-31 LAB — CBC WITH DIFFERENTIAL/PLATELET
Abs Immature Granulocytes: 0.16 10*3/uL — ABNORMAL HIGH (ref 0.00–0.07)
Basophils Absolute: 0.1 10*3/uL (ref 0.0–0.1)
Basophils Relative: 1 %
Eosinophils Absolute: 0.4 10*3/uL (ref 0.0–0.5)
Eosinophils Relative: 3 %
HCT: 25.5 % — ABNORMAL LOW (ref 39.0–52.0)
Hemoglobin: 8.1 g/dL — ABNORMAL LOW (ref 13.0–17.0)
Immature Granulocytes: 1 %
Lymphocytes Relative: 10 %
Lymphs Abs: 1.3 10*3/uL (ref 0.7–4.0)
MCH: 28.3 pg (ref 26.0–34.0)
MCHC: 31.8 g/dL (ref 30.0–36.0)
MCV: 89.2 fL (ref 80.0–100.0)
Monocytes Absolute: 1 10*3/uL (ref 0.1–1.0)
Monocytes Relative: 7 %
Neutro Abs: 10.2 10*3/uL — ABNORMAL HIGH (ref 1.7–7.7)
Neutrophils Relative %: 78 %
Platelets: 449 10*3/uL — ABNORMAL HIGH (ref 150–400)
RBC: 2.86 MIL/uL — ABNORMAL LOW (ref 4.22–5.81)
RDW: 21.2 % — ABNORMAL HIGH (ref 11.5–15.5)
WBC: 13.1 10*3/uL — ABNORMAL HIGH (ref 4.0–10.5)
nRBC: 0 % (ref 0.0–0.2)

## 2020-10-31 LAB — GLUCOSE, CAPILLARY
Glucose-Capillary: 147 mg/dL — ABNORMAL HIGH (ref 70–99)
Glucose-Capillary: 225 mg/dL — ABNORMAL HIGH (ref 70–99)
Glucose-Capillary: 169 mg/dL — ABNORMAL HIGH (ref 70–99)
Glucose-Capillary: 204 mg/dL — ABNORMAL HIGH (ref 70–99)

## 2020-10-31 LAB — BASIC METABOLIC PANEL
Anion gap: 4 — ABNORMAL LOW (ref 5–15)
BUN: 7 mg/dL — ABNORMAL LOW (ref 8–23)
CO2: 29 mmol/L (ref 22–32)
Calcium: 7.7 mg/dL — ABNORMAL LOW (ref 8.9–10.3)
Chloride: 103 mmol/L (ref 98–111)
Creatinine, Ser: 0.45 mg/dL — ABNORMAL LOW (ref 0.61–1.24)
GFR, Estimated: >60 mL/min (ref >60)
Glucose, Bld: 144 mg/dL — ABNORMAL HIGH (ref 70–99)
Potassium: 3.3 mmol/L — ABNORMAL LOW (ref 3.5–5.1)
Sodium: 136 mmol/L (ref 135–145)

## 2020-10-31 IMAGING — CT CT CHEST W/ CM
2 of 3 series · 15 of 36 positions shown, 18 images · IV contrast (APPLIED)
Comparison: CT abdomen pelvis, [DATE], CT chest abdomen pelvis,
[DATE]

CLINICAL DATA: Colorectal cancer staging

EXAM:
CT CHEST WITH CONTRAST
TECHNIQUE: Multidetector CT imaging of the chest was performed during
intravenous contrast administration.
CONTRAST:  75mL OMNIPAQUE IOHEXOL 300 MG/ML  SOLN

[Series 3: thorax 2.0 i31f 2 · axial · 0.83mm/px · z∈[+1112,+1356]mm · 12 of 144 slices shown, 15 images]
[im 11/144  mediastinal]
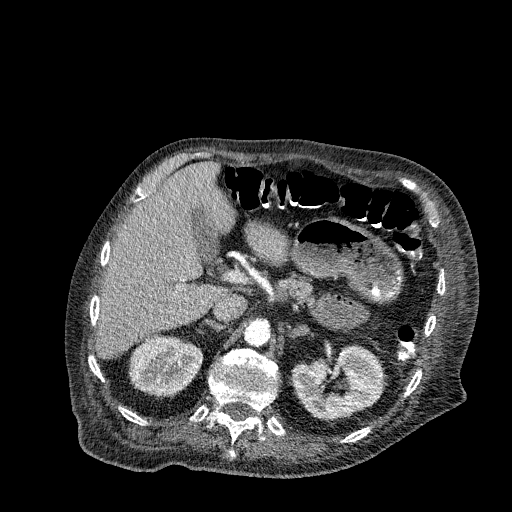
[im 11/144  lung]
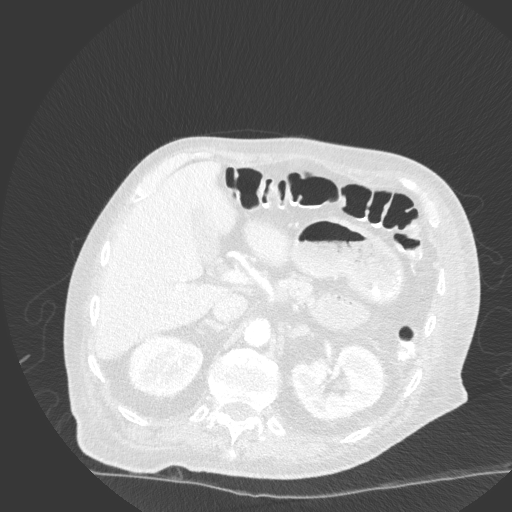
[im 22/144  lung]
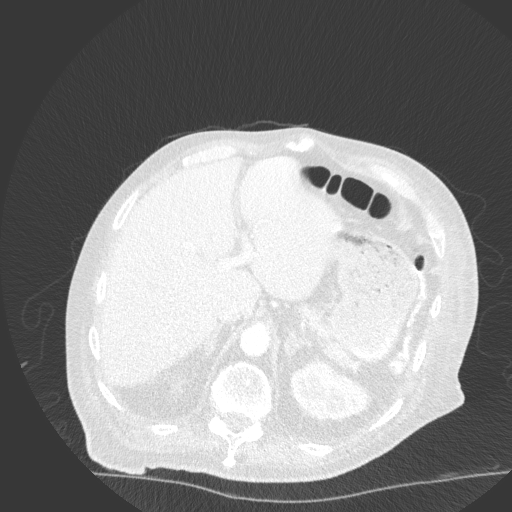
[im 32/144  lung]
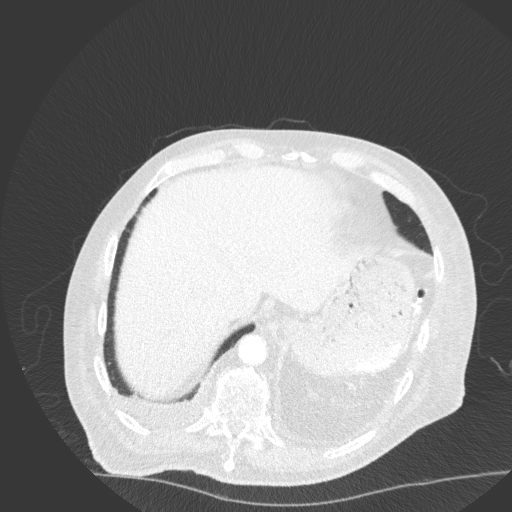
[im 43/144  lung]
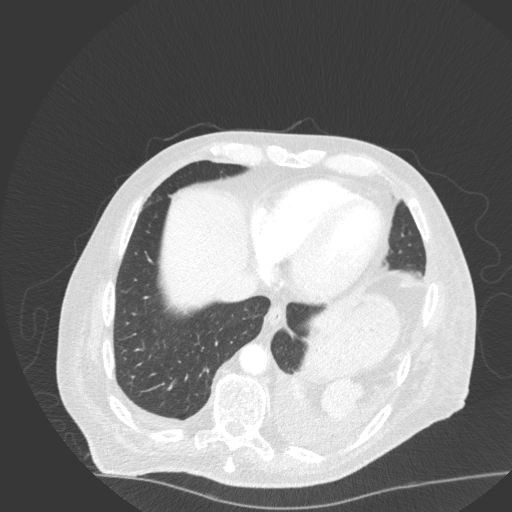
[im 53/144  mediastinal]
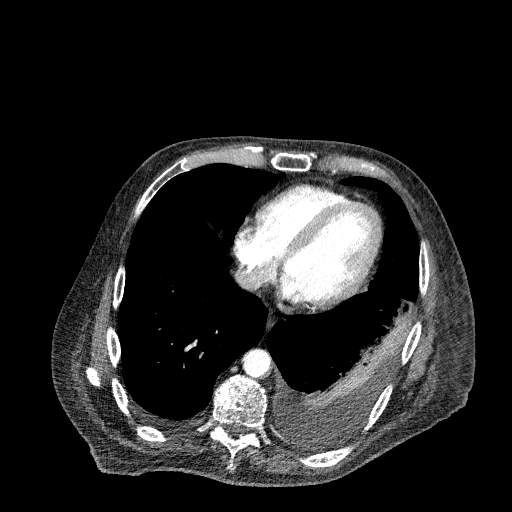
[im 53/144  lung]
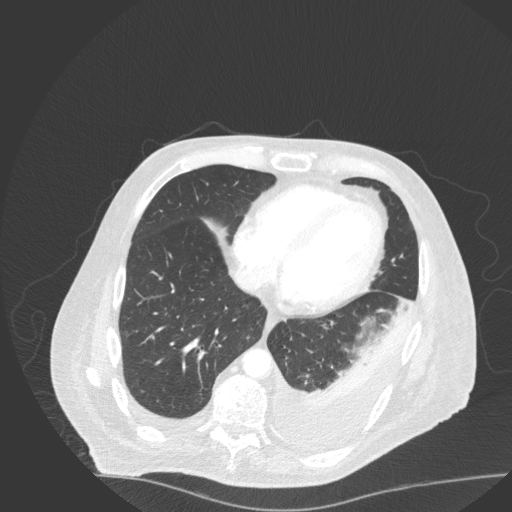
[im 64/144  lung]
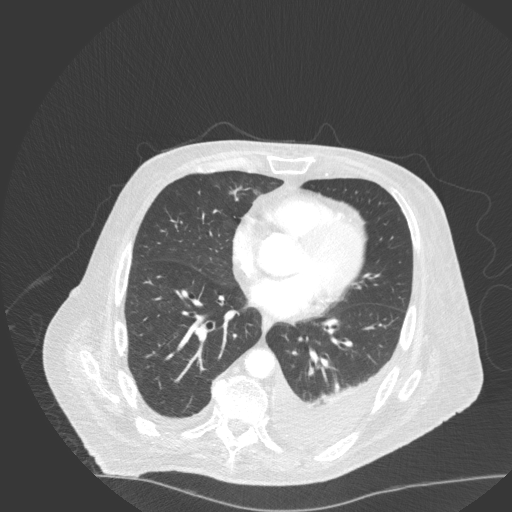
[im 80/144  lung]
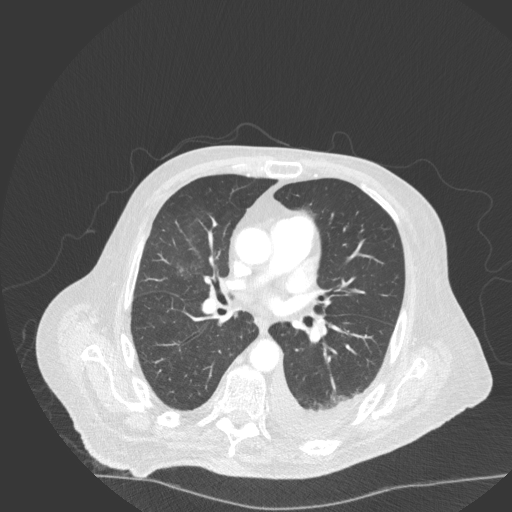
[im 91/144  lung]
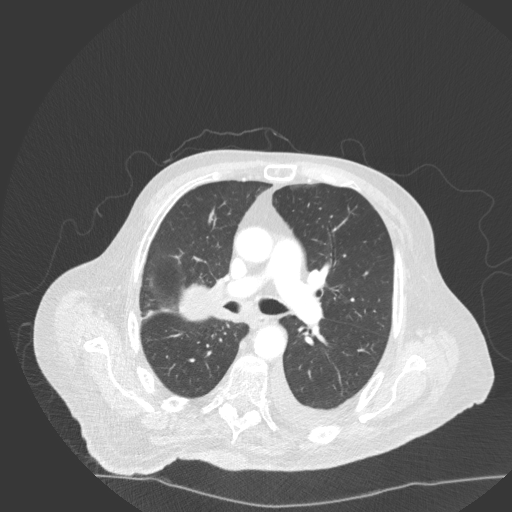
[im 101/144  mediastinal]
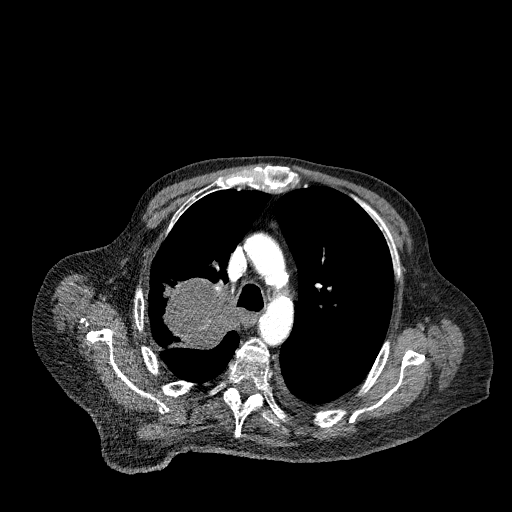
[im 101/144  lung]
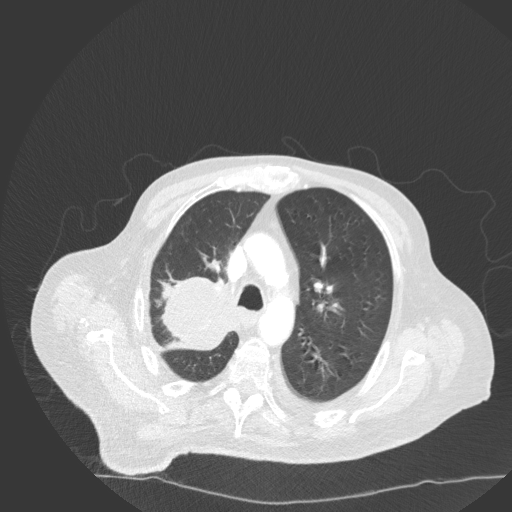
[im 112/144  lung]
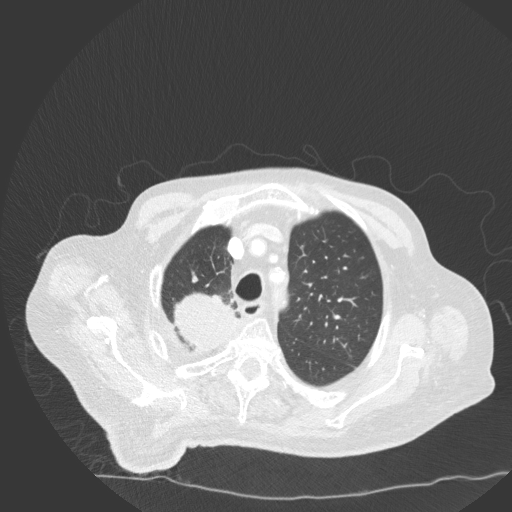
[im 122/144  lung]
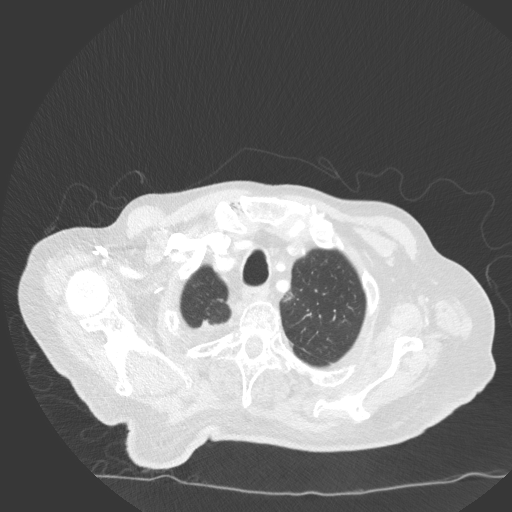
[im 133/144  lung]
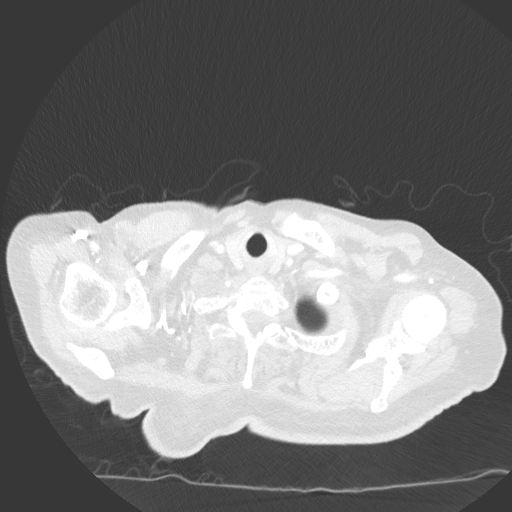

[Series 6: coronal · coronal · 0.60mm/px · 3 of 151 slices shown]
[im 31/151  lung]
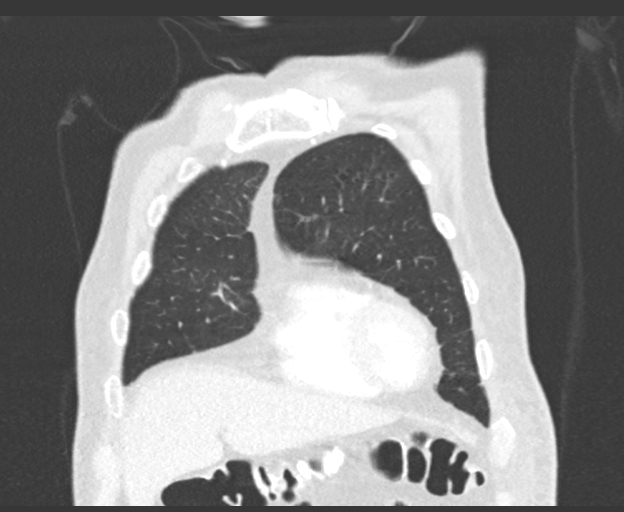
[im 61/151  lung]
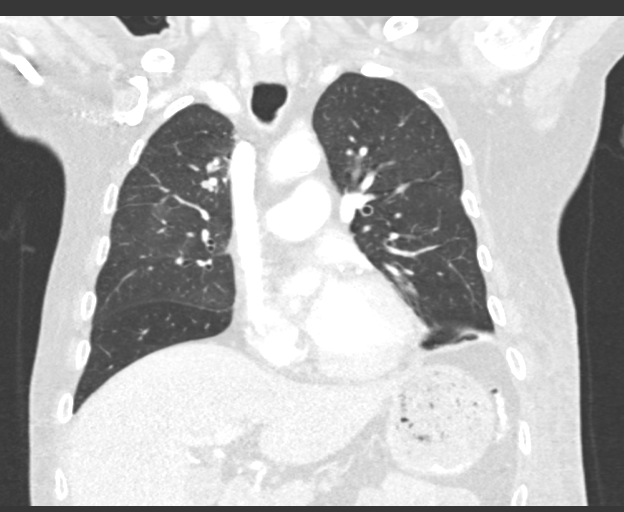
[im 91/151  lung]
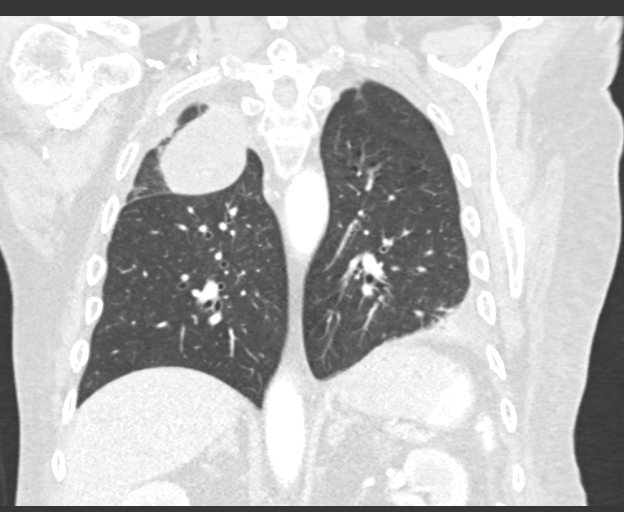

[15 of 36 positions shown; findings below may reference images not displayed]

FINDINGS: Cardiovascular: Incidental note of segmental to subsegmental
pulmonary embolus in the left lung (series 3, image 68, 59).).
Normal heart size. Left coronary artery calcifications. No
pericardial effusion. Aortic atherosclerosis.

Mediastinum/Nodes: Enlarged right hilar/subcarinal lymph nodes are
diminished in size, largest node measuring 2.6 x 1.5 cm, previously
3.3 x 2.3 cm (series 3, image 50). Thyroid gland, trachea, and
esophagus demonstrate no significant findings.

Lungs/Pleura: Large right upper lobe mass is somewhat diminished in
size compared to prior staging examination dated [DATE],
measuring approximately 6.0 x 5.4 cm, previously 8.0 x 6.4 cm
(series 5, image 44). New moderate left, small right pleural
effusions and associated atelectasis or consolidation.

Upper Abdomen: No acute abnormality.

Musculoskeletal: No chest wall mass or suspicious bone lesions
identified.
IMPRESSION: 1. Large right upper lobe mass is somewhat diminished in size
compared to prior staging examination dated [DATE], measuring
approximately 6.0 x 5.4 cm, previously 8.0 x 6.4 cm.
2. Enlarged right hilar/subcarinal lymph nodes are diminished in
size, largest node measuring 2.6 x 1.5 cm, previously 3.3 x 2.3 cm.
3. Incidental note of segmental to subsegmental pulmonary embolus in
the left lung.
4. New moderate left, small right pleural effusions and associated
atelectasis or consolidation. Left-sided effusion is perhaps
reactive to pulmonary embolus.
5. Coronary artery disease.

These results will be called to the ordering clinician or
representative by the Radiologist Assistant, and communication
documented in the PACS or [REDACTED].

Aortic Atherosclerosis ([GX]-[GX]).

## 2020-10-31 MED ORDER — POTASSIUM CHLORIDE CRYS ER 20 MEQ PO TBCR
40.0000 meq | EXTENDED_RELEASE_TABLET | Freq: Once | ORAL | Status: AC
  Administered 2020-10-31: 40 meq via ORAL
  Filled 2020-10-31: qty 2

## 2020-10-31 MED ORDER — IOHEXOL 300 MG/ML  SOLN
75.0000 mL | Freq: Once | INTRAMUSCULAR | Status: AC | PRN
  Administered 2020-10-31: 75 mL via INTRAVENOUS

## 2020-10-31 NOTE — Telephone Encounter (Signed)
Manuela Schwartz a nurse case manager for [facility] called in regards to the hospital bed for the patient. [phone] I let Manuela Schwartz know that I found out this morning that [facility] is not able to deliver the hospital bed to the [facility] area as it is out of their service area.  Manuela Schwartz gave me a number to [facility] [phone] and the fax is [phone] and I spoke to Angie and faxed the required documentation with Attn: Manuela Schwartz as they do accept this insurance and stated that they have the bed, trapeze bar, and slide bar in stock.  (Also [employer] as another resource if needed [phone].)  Manuela Schwartz is also checking with [facility].

## 2020-10-31 NOTE — Progress Notes (Signed)
Physical Therapy Treatment Patient Details Name: Thomas Garcia MRN: [id number] DOB: 04-08-1941 Today's Date: 10/31/2020    History of Present Illness 80 y.o. male presented to [facility] on 7/15 with nonhealing necrotic R BKA. Transferred to [facility] for revision s/p R AKA 7/17. PMHx significant for lung cancer with brain mets, L BKA, Hx of PE and DM with neuropathy.    PT Comments    Pt longsitting in bed on entry, reports he will not get out of bed today because he does not like it when staff does not get him back to bed in a timely manner. Pt overall frustrated with being hospitalized for so long. Pt agreeable to bed level exercise. With moving to sidelying pt condom cath found to be off and pad under him was wet. Pt rolls with supervision and assist for clean pad placement. After which pt performs sidelying and supine exercises. D/c plan remains appropriate. PT will continue to follow acutely.     Follow Up Recommendations  Home health PT;Supervision for mobility/OOB     Equipment Recommendations  Hospital bed       Precautions / Restrictions Precautions Precautions: Fall Precaution Comments: R AKA and L BKA Restrictions Weight Bearing Restrictions: Yes RLE Weight Bearing: Non weight bearing LLE Weight Bearing: Non weight bearing    Mobility  Bed Mobility Overal bed mobility: Needs Assistance Bed Mobility: Rolling Rolling: Supervision   Supine to sit: HOB elevated;Supervision     General bed mobility comments: able to come to longsitting with use of bilateral rails, and roll bilaterally for placement of clean pad    Transfers Overall transfer level: Needs assistance Equipment used: None Transfers: Comptroller transfers: Mod assist   General transfer comment: refused OOB today reporting "noone gets me back when I want to go back to bed"      Balance Overall balance assessment: Needs assistance Sitting-balance support: Bilateral  upper extremity supported Sitting balance-Leahy Scale: Fair Sitting balance - Comments: Min A to maintain static sitting in longsitting with unilateral vs bilateral UE support on bed surface.       Standing balance comment: L AKA, R BKA                            Cognition Arousal/Alertness: Awake/alert Behavior During Therapy: WFL for tasks assessed/performed Overall Cognitive Status: Impaired/Different from baseline                               Problem Solving: Slow processing;Requires verbal cues;Requires tactile cues;Difficulty sequencing General Comments: pt requires increased time and cuing for exercises today      Exercises Amputee Exercises Hip Extension: AROM;Both;Sidelying Hip ABduction/ADduction: AROM;Both;Sidelying Hip Flexion/Marching: AROM;Both;Sidelying Knee Flexion: AROM;Right;10 reps;Supine Knee Extension: AROM;Right;Supine Straight Leg Raises: AROM;Both;Supine    General Comments General comments (skin integrity, edema, etc.): VSS on RA, RN in room giving medication at endo ofsession      Pertinent Vitals/Pain Pain Assessment: Faces Faces Pain Scale: Hurts a little bit Pain Location: R residual limb Pain Descriptors / Indicators: Aching;Grimacing;Guarding;Sore Pain Intervention(s): Limited activity within patient's tolerance;Monitored during session;Repositioned     PT Goals (current goals can now be found in the care plan section) Acute Rehab PT Goals Patient Stated Goal: To return home. PT Goal Formulation: With patient Time For Goal Achievement: 11/07/20 Potential to Achieve Goals: Poor Progress towards PT goals: Not  progressing toward goals - comment (self limiting, frustrated with long term hospitalization)    Frequency    Min 3X/week      PT Plan Discharge plan needs to be updated       AM-PAC PT "6 Clicks" Mobility   Outcome Measure  Help needed turning from your back to your side while in a flat bed  without using bedrails?: None Help needed moving from lying on your back to sitting on the side of a flat bed without using bedrails?: A Little Help needed moving to and from a bed to a chair (including a wheelchair)?: Total Help needed standing up from a chair using your arms (e.g., wheelchair or bedside chair)?: Total Help needed to walk in hospital room?: Total Help needed climbing 3-5 steps with a railing? : Total 6 Click Score: 11    End of Session   Activity Tolerance: Patient tolerated treatment well;Other (comment) (self limiting in mobility today) Patient left: with call bell/phone within reach;with nursing/sitter in room;in bed;with bed alarm set Nurse Communication: Mobility status PT Visit Diagnosis: Other abnormalities of gait and mobility (R26.89);Muscle weakness (generalized) (M62.81);Pain Pain - Right/Left: Right Pain - part of body: Leg     Time: 0981-1914 PT Time Calculation (min) (ACUTE ONLY): 21 min  Charges:  $Therapeutic Exercise: 8-22 mins                     Rodnisha Blomgren B. Migdalia Dk PT, DPT Acute Rehabilitation Services Pager [phone] Office [phone]    [place] 10/31/2020, 9:26 AM

## 2020-10-31 NOTE — Telephone Encounter (Signed)
Patients brother who is his care giver called and stated that they are not able to get the hospital bed. Reaching out to [facility] to see if they can help.

## 2020-10-31 NOTE — Consult Note (Signed)
Time  [phone] Fax:([phone]   MEDICAL ONCOLOGY - INITIAL CONSULTATION   Referral MD: Dr. Shelly Coss  Reason for Referral: Duodenal lesions, metastatic lung cancer  HPI: Thomas Garcia is a 80 year old male with a past medical history significant for hypertension, type 2 diabetes mellitus, PAD, history of splenectomy, metastatic lung cancer.  I previously consulted on this patient while he was hospitalized earlier this year.  Please see my consult note from 07/11/2020 for details.  Briefly, during that hospitalization he was found to have a lung mass and brain lesion.  He underwent right craniotomy for resection on 07/08/2020.  At the time that I saw him in April 2022, the pathology was pending.  Outpatient follow-up was scheduled for him at the [facility].  I see an appointment scheduled for 09/29/2020, but the patient canceled this appointment.  He is now rescheduled to see Dr. Delton Coombes on 11/07/2020.  The patient received SRS for his brain mets 08/23/2020 through 09/16/2020.  There is mention of giving radiation to his lung mass, but I cannot tell from the notes if he actually received this yet.  Since I last saw him, he underwent right below the knee amputation on 09/28/2020.  He presented to the hospital with concerns for right leg stump infection.  He was having some drainage from the incision site.  On admission, his WBC was 16,000.  Vascular surgery was consulted and he was transferred to [facility] and started on antibiotics.  He developed worsening anemia this admission with a hemoglobin down to 6.3.  He received 2 units PRBCs.  He had heme positive stools.  He had a CT of the abdomen/pelvis with contrast performed on 10/29/2020 which showed an irregular bowel wall thickening within the third portion of the duodenum, in addition there is irregular bowel wall thickening in several portions of the proximal jejunum, no evidence of bowel obstruction.   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  Overall endoscopic findings were concerning for small bowel implants/metastatic disease.  Colonoscopy was also performed on 10/29/2020 which showed several polyps.  The biopsy results of the duodenal lesions are currently pending.  I met with the patient today in his hospital room.  His brother was at the bedside.  The patient tells me that he is feeling better with regards to his right BKA.  He has no longer having any drainage from this area.  He reports a poor appetite and has lost weight recently.  He currently denies chest pain and shortness of breath.  He reports that he has had hemoptysis in the past but has not had any hemoptysis in the past several weeks.  He denies abdominal pain, nausea, vomiting, constipation, diarrhea.  The patient is planning to discharge to home with his brother.  They are currently in the process of arranging durable medical equipment.  He tells me that he still smokes about half a pack of cigarettes per day.  The patient recalls receiving radiation to his brain.  He does not recall having had medical oncology follow-up scheduled for him.  He seems to lack some insight into his underlying medical conditions. Medical oncology was asked see the patient for recommendations regarding his duodenal lesions.  Past  Medical History:  Diagnosis Date   Diabetes mellitus without complication ([place])    Kidney stones    Lung cancer ([place]) 2022   PONV (postoperative nausea and vomiting)   :   Past Surgical History:  Procedure Laterality Date   ABDOMINAL AORTOGRAM W/LOWER EXTREMITY N/A 09/17/2018   Procedure: ABDOMINAL AORTOGRAM W/LOWER EXTREMITY;  Surgeon: Marty Heck, MD;  Location: [place] [facility];  Service: Cardiovascular;  Laterality: N/A;   AMPUTATION Left 09/22/2018   Procedure: AMPUTATION BELOW KNEE LEFT;  Surgeon: Marty Heck, MD;  Location: [place];  Service: Vascular;  Laterality: Left;   AMPUTATION Right 09/28/2020   Procedure: RIGHT BELOW KNEE AMPUTATION;  Surgeon: Marty Heck, MD;  Location: [place];  Service: Vascular;  Laterality: Right;   AMPUTATION Right 10/23/2020   Procedure: REVISION OF RIGHT  ABOVE KNEE AMPUTATION;  Surgeon: Marty Heck, MD;  Location: [place];  Service: Vascular;  Laterality: Right;   AMPUTATION TOE Left 09/10/2018   Procedure: AMPUTATION OF THIRD TOE LEFT FOOT, DEBRIDEMENT OF ULCERS ON SECOND TOE AND PARTIAL AMPUTATION SECOND TOE ON LEFT FOOT;  Surgeon: Virl Cagey, MD;  Location: [facility];  Service: General;  Laterality: Left;   APPLICATION OF CRANIAL NAVIGATION N/A 07/08/2020   Procedure: APPLICATION OF CRANIAL NAVIGATION;  Surgeon: Karsten Ro, DO;  Location: [place];  Service: Neurosurgery;  Laterality: N/A;   APPLICATION OF WOUND VAC Left 09/18/2018   Procedure: APPLICATION OF WOUND VAC;  Surgeon: Marty Heck, MD;  Location: [place];  Service: Vascular;  Laterality: Left;   CRANIOTOMY Right 07/08/2020   Procedure: Right Occipital craniotomy for tumor resection with brainlab;  Surgeon: Dawley, Theodoro Doing, DO;  Location: [place];  Service: Neurosurgery;  Laterality: Right;   CYSTOSCOPY  05/08/2012   Procedure: CYSTOSCOPY;  Surgeon: Marissa Nestle, MD;  Location: [facility];  Service: Urology;  Laterality: N/A;  Evacuation of clots   IR IVC FILTER PLMT / S&I /IMG GUID/MOD SED  07/05/2020   KIDNEY SURGERY     sutures    PERIPHERAL VASCULAR BALLOON ANGIOPLASTY  09/17/2018   Procedure: PERIPHERAL VASCULAR BALLOON ANGIOPLASTY;  Surgeon: Marty Heck, MD;  Location: [place] [facility];  Service: Cardiovascular;;  LT SFA and AT   RADIOLOGY WITH ANESTHESIA N/A 06/30/2020   Procedure:  MRI WITH ANESTHESIA   BRAIN WITH AND WITHOUT CONTRAST;  Surgeon: Radiologist, Medication, MD;  Location: [place];  Service: Radiology;  Laterality: N/A;   SPLENECTOMY     TRANSMETATARSAL AMPUTATION Left 09/18/2018   Procedure: TRANSMETATARSAL AMPUTATION LEFT FOOT;  Surgeon: Marty Heck, MD;  Location: [place];  Service: Vascular;  Laterality: Left;   TRANSURETHRAL RESECTION OF PROSTATE  05/14/2012   Procedure: TRANSURETHRAL RESECTION OF THE PROSTATE (TURP);  Surgeon: Marissa Nestle, MD;  Location: [facility];  Service: Urology;  Laterality: N/A;  :   Current Facility-Administered Medications  Medication Dose Route Frequency Provider Last Rate Last Admin   (feeding supplement) PROSource Plus liquid 30 mL  30 mL Oral BID BM Little Ishikawa, MD   30 mL at 10/31/20 0903   0.9 %  sodium chloride infusion (Manually program via Guardrails IV Fluids)   Intravenous Once Chotiner, Yevonne Aline, MD       atorvastatin (LIPITOR) tablet 10 mg  10 mg Oral q1800 Rhyne, Samantha J, PA-C   10 mg at 10/30/20 1800   docusate sodium (COLACE) capsule 100 mg  100 mg Oral Daily Rhyne, Aldona Bar  J, PA-C   100 mg at 10/31/20 0900   feeding supplement (BOOST / RESOURCE BREEZE) liquid 1 Container  1 Container Oral TID BM Little Ishikawa, MD   1 Container at 10/30/20 2021   ferrous sulfate tablet 324 mg  324 mg Oral Q breakfast Gabriel Earing, PA-C   324 mg at 10/31/20 6222   insulin aspart (novoLOG) injection 0-9 Units  0-9 Units Subcutaneous TID WC RhyneHulen Shouts, PA-C   1 Units at 10/31/20 9798   levETIRAcetam (KEPPRA) tablet 500 mg  500 mg Oral BID Gabriel Earing, PA-C   500 mg at 10/31/20 0901   methocarbamol (ROBAXIN) tablet 500 mg  500 mg Oral Q6H PRN Gabriel Earing, PA-C   500 mg at 10/31/20 9211   multivitamin with minerals tablet 1 tablet  1 tablet Oral Daily Little Ishikawa, MD   1 tablet at 10/31/20 0900   nicotine (NICODERM CQ - dosed in mg/24 hr) patch 7 mg  7 mg Transdermal Daily  Rhyne, Samantha J, PA-C   7 mg at 10/31/20 9417   nystatin (MYCOSTATIN) 100000 UNIT/ML suspension 500,000 Units  5 mL Oral QID Regalado, Belkys A, MD   500,000 Units at 10/30/20 2139   ondansetron (ZOFRAN) tablet 4 mg  4 mg Oral Q6H PRN Rhyne, Samantha J, PA-C       Or   ondansetron (ZOFRAN) injection 4 mg  4 mg Intravenous Q6H PRN Rhyne, Samantha J, PA-C   4 mg at 10/26/20 4081   oxyCODONE (OXYCONTIN) 12 hr tablet 10 mg  10 mg Oral Q12H Rhyne, Samantha J, PA-C   10 mg at 10/31/20 4481   oxyCODONE-acetaminophen (PERCOCET/ROXICET) 5-325 MG per tablet 2 tablet  2 tablet Oral Q4H PRN Gabriel Earing, PA-C   2 tablet at 10/30/20 8563   pantoprazole (PROTONIX) EC tablet 40 mg  40 mg Oral Q0600 Rhyne, Hulen Shouts, PA-C   40 mg at 10/31/20 0520   phenol (CHLORASEPTIC) mouth spray 1 spray  1 spray Mouth/Throat PRN Rhyne, Samantha J, PA-C         No Known Allergies:   Family History  Problem Relation Age of Onset   Diabetes Mother   :   Social History   Socioeconomic History   Marital status: Legally Separated    Spouse name: Not on file   Number of children: Not on file   Years of education: Not on file   Highest education level: Not on file  Occupational History   Not on file  Tobacco Use   Smoking status: Former    Packs/day: 0.50    Years: 45.00    Pack years: 22.50    Types: Cigarettes   Smokeless tobacco: Never   Tobacco comments:    4 cigarettes per day  Vaping Use   Vaping Use: Never used  Substance and Sexual Activity   Alcohol use: No   Drug use: No   Sexual activity: Not on file  Other Topics Concern   Not on file  Social History Narrative   Divorced since 2011.Lives with brother.Retired,previously maintenance work for [employer]   Social Determinants of Health   Financial Resource Strain: Low Risk    Difficulty of Paying Living Expenses: Not very hard  Food Insecurity: No Food Insecurity   Worried About Charity fundraiser in the Last Year: Never true    Ran Out of Food in the Last Year: Never true  Transportation Needs: Unmet Transportation Needs  Lack of Transportation (Medical): Yes   Lack of Transportation (Non-Medical): Yes  Physical Activity: Not on file  Stress: Not on file  Social Connections: Socially Isolated   Frequency of Communication with Friends and Family: More than three times a week   Frequency of Social Gatherings with Friends and Family: More than three times a week   Attends Religious Services: Never   Marine scientist or Organizations: No   Attends Music therapist: Never   Marital Status: Separated  Intimate Partner Violence: Not At Risk   Fear of Current or Ex-Partner: No   Emotionally Abused: No   Physically Abused: No   Sexually Abused: No  :  Review of Systems: A comprehensive 14 point review of systems was negative except as noted in the HPI.  Exam: Patient Vitals for the past 24 hrs:  BP Temp Temp src Pulse Resp SpO2 Weight  10/31/20 0947 (!) 104/44 98.7 F (37.1 C) Oral 83 18 97 % --  10/31/20 0448 125/65 98.3 F (36.8 C) Oral 94 18 97 % 67.6 kg  10/30/20 2033 121/63 98.2 F (36.8 C) Oral 80 18 98 % --  10/30/20 1743 (!) 137/43 98.1 F (36.7 C) Oral 85 18 98 % --    General: Chronically ill-appearing male, no distress. Eyes:  no scleral icterus.   ENT: Poor dentition, there were no oropharyngeal lesions.    Lymphatics:  Negative cervical, supraclavicular or axillary adenopathy.   Respiratory: Rhonchi. Cardiovascular:  Regular rate and rhythm, S1/S2, without murmur, rub or gallop.     GI:  abdomen was soft, flat, nontender, nondistended, without organomegaly.   Musculoskeletal: Bilateral BKA.  Staples intact to right BKA. Skin exam was without echymosis, petichae.   Neuro exam was nonfocal. Patient was alert and oriented.    Lab Results  Component Value Date   WBC 13.1 (H) 10/31/2020   HGB 8.1 (L) 10/31/2020   HCT 25.5 (L) 10/31/2020   PLT 449 (H) 10/31/2020    GLUCOSE 144 (H) 10/31/2020   CHOL 109 04/14/2020   TRIG 85 04/14/2020   HDL 49 04/14/2020   LDLCALC 43 04/14/2020   ALT 9 10/21/2020   AST 10 (L) 10/21/2020   NA 136 10/31/2020   K 3.3 (L) 10/31/2020   CL 103 10/31/2020   CREATININE 0.45 (L) 10/31/2020   BUN 7 (L) 10/31/2020   CO2 29 10/31/2020    DG Knee 1-2 Views Right  Result Date: 10/21/2020 CLINICAL DATA:  Postop for below the knee amputation with cellulitis. EXAM: RIGHT KNEE - 1-2 VIEW COMPARISON:  09/22/2020 CT FINDINGS: Interval below the knee amputation with surgical clips. Soft tissue swelling and gas about the surgical site. No gross osseous destruction. Bone fragment anterior and lateral to the distal fibula. Degenerative changes of the mediolateral compartments of the knee. Vascular calcifications. IMPRESSION: Soft tissue swelling and gas about the surgical site, status post below the knee a amputation. Findings suggest cellulitis and possible necrotizing fasciitis. No gross osseous destruction to confirm recurrent osteomyelitis. Electronically Signed   By: Abigail Miyamoto M.D.   On: 10/21/2020 13:28   CT ABDOMEN PELVIS W CONTRAST  Result Date: 10/29/2020 CLINICAL DATA:  Metastatic disease evaluation EXAM: CT ABDOMEN AND PELVIS WITH CONTRAST TECHNIQUE: Multidetector CT imaging of the abdomen and pelvis was performed using the standard protocol following bolus administration of intravenous contrast. CONTRAST:  68mL OMNIPAQUE IOHEXOL 300 MG/ML  SOLN COMPARISON:  June 27, 2020 FINDINGS: Lower chest: Small LEFT pleural effusion. Trace  RIGHT pleural effusion. New irregular consolidative opacity of the lingula. LEFT basilar atelectasis. Unchanged ground-glass opacity of the RIGHT lower lobe measuring 7 mm (series 4, image 10). Increasesd centrilobular ground-glass within the RIGHT middle lobe. Hepatobiliary: No focal liver abnormality is seen. No gallstones, gallbladder wall thickening, or biliary dilatation. Pancreas: Unremarkable. No  pancreatic ductal dilatation or surrounding inflammatory changes. Spleen: Unchanged appearance of a partially calcified lobulated spleen in the LEFT upper quadrant. Adrenals/Urinary Tract: Nodular thickening of the LEFT adrenal gland, unchanged. RIGHT adrenal gland is unremarkable. No hydronephrosis. LEFT-sided renal cysts. Bladder stone. Stomach/Bowel: In the third portion of the duodenum, there is focal irregular bowel wall thickening anteriorly. It measures approximately 2.6 x 1.1 cm (series 3, image 35). The folds of the proximal jejunum are thickened in appearance with several areas of additional focal irregular bowel wall thickening within the jejunum (series 3, image 33 cyst with series 6, image 50-60; series 3, image 45 with series 6, image 85). Appendix is normal. No evidence of bowel obstruction. Vascular/Lymphatic: Atherosclerotic calcifications of the nonaneurysmal aorta. IVC filter. Reproductive: Prostatomegaly with marked hypertrophy of the median lobe with associated calcification. Other: Trace free fluid.  No free air. Musculoskeletal: Osteopenia.  No aggressive osseous lesion. IMPRESSION: 1. There is irregular bowel wall thickening within the third portion of the duodenum. In addition, there is irregular bowel wall thickening in several portions of the proximal jejunum. Findings are concerning for malignancy with differential considerations including multifocal small-bowel adenocarcinoma, lymphoma or metastatic disease from known lung primary. 2. No evidence of bowel obstruction. 3. New irregular consolidation of the lingula and scattered ground-glass opacities of the RIGHT middle lobe. Findings are likely infectious or inflammatory in etiology. Recommend attention on follow-up as per clinical protocol. 4. Small LEFT pleural effusion and trace RIGHT pleural effusion. Small volume ascites. 5. Prostatomegaly with revisualization of a bladder stone. Aortic Atherosclerosis (ICD10-I70.0). Electronically  Signed   By: Valentino Saxon MD   On: 10/29/2020 20:08     DG Knee 1-2 Views Right  Result Date: 10/21/2020 CLINICAL DATA:  Postop for below the knee amputation with cellulitis. EXAM: RIGHT KNEE - 1-2 VIEW COMPARISON:  09/22/2020 CT FINDINGS: Interval below the knee amputation with surgical clips. Soft tissue swelling and gas about the surgical site. No gross osseous destruction. Bone fragment anterior and lateral to the distal fibula. Degenerative changes of the mediolateral compartments of the knee. Vascular calcifications. IMPRESSION: Soft tissue swelling and gas about the surgical site, status post below the knee a amputation. Findings suggest cellulitis and possible necrotizing fasciitis. No gross osseous destruction to confirm recurrent osteomyelitis. Electronically Signed   By: Abigail Miyamoto M.D.   On: 10/21/2020 13:28   CT ABDOMEN PELVIS W CONTRAST  Result Date: 10/29/2020 CLINICAL DATA:  Metastatic disease evaluation EXAM: CT ABDOMEN AND PELVIS WITH CONTRAST TECHNIQUE: Multidetector CT imaging of the abdomen and pelvis was performed using the standard protocol following bolus administration of intravenous contrast. CONTRAST:  50mL OMNIPAQUE IOHEXOL 300 MG/ML  SOLN COMPARISON:  June 27, 2020 FINDINGS: Lower chest: Small LEFT pleural effusion. Trace RIGHT pleural effusion. New irregular consolidative opacity of the lingula. LEFT basilar atelectasis. Unchanged ground-glass opacity of the RIGHT lower lobe measuring 7 mm (series 4, image 10). Increasesd centrilobular ground-glass within the RIGHT middle lobe. Hepatobiliary: No focal liver abnormality is seen. No gallstones, gallbladder wall thickening, or biliary dilatation. Pancreas: Unremarkable. No pancreatic ductal dilatation or surrounding inflammatory changes. Spleen: Unchanged appearance of a partially calcified lobulated spleen in the LEFT  upper quadrant. Adrenals/Urinary Tract: Nodular thickening of the LEFT adrenal gland, unchanged. RIGHT  adrenal gland is unremarkable. No hydronephrosis. LEFT-sided renal cysts. Bladder stone. Stomach/Bowel: In the third portion of the duodenum, there is focal irregular bowel wall thickening anteriorly. It measures approximately 2.6 x 1.1 cm (series 3, image 35). The folds of the proximal jejunum are thickened in appearance with several areas of additional focal irregular bowel wall thickening within the jejunum (series 3, image 33 cyst with series 6, image 50-60; series 3, image 45 with series 6, image 85). Appendix is normal. No evidence of bowel obstruction. Vascular/Lymphatic: Atherosclerotic calcifications of the nonaneurysmal aorta. IVC filter. Reproductive: Prostatomegaly with marked hypertrophy of the median lobe with associated calcification. Other: Trace free fluid.  No free air. Musculoskeletal: Osteopenia.  No aggressive osseous lesion. IMPRESSION: 1. There is irregular bowel wall thickening within the third portion of the duodenum. In addition, there is irregular bowel wall thickening in several portions of the proximal jejunum. Findings are concerning for malignancy with differential considerations including multifocal small-bowel adenocarcinoma, lymphoma or metastatic disease from known lung primary. 2. No evidence of bowel obstruction. 3. New irregular consolidation of the lingula and scattered ground-glass opacities of the RIGHT middle lobe. Findings are likely infectious or inflammatory in etiology. Recommend attention on follow-up as per clinical protocol. 4. Small LEFT pleural effusion and trace RIGHT pleural effusion. Small volume ascites. 5. Prostatomegaly with revisualization of a bladder stone. Aortic Atherosclerosis (ICD10-I70.0). Electronically Signed   By: Valentino Saxon MD   On: 10/29/2020 20:08    Pathology:  SURGICAL PATHOLOGY  CASE: [id number]  PATIENT: Thomas Garcia  Surgical Pathology Report   Clinical History: Brain mass (cm)   FINAL MICROSCOPIC DIAGNOSIS:   AB.  BRAIN TUMOR, RIGHT OCCIPITAL, EXCISION:  - Metastatic carcinoma.   COMMENT:   Immunohistochemistry for Pancytokeratin, CK7 and TTF-1 is positive.  CK20, CDX-2, PAX 8, GATA-3,  CA-IX, S-100 and Melan-A are negative.  The immunophenotype is  compatible with pulmonary adenocarcinoma.  Results reported to Dr. Pieter Partridge  Dawley on 07/13/2020.  If applicable, there is likely sufficient  material for ancillary studies.  Dr. Saralyn Pilar reviewed the case.   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  5) type 2 diabetes mellitus  6) hypertension  PLAN: -Discussed prior biopsy of his brain lesion, prior imaging studies, current imaging studies, and findings from EGD with the patient and his brother. -We discussed that he received treatment to his brain lesions in the form of surgery followed by [facility].  However, he did not receive systemic treatment for his metastatic lung cancer. -We further discussed that the findings and his duodenum may be related to metastatic disease versus a second primary.  Will await pathology report.  Further treatment recommendations pending complete work-up. -Recommend proceeding with a CT of the chest to complete his staging work-up. -We will also recheck iron studies in the a.m. given recent GI bleed.  He has received Feraheme in the past and may benefit from a repeat dose prior to hospital  discharge. -The patient already has a new patient appointment with Dr. Delton Coombes scheduled for 11/07/2020.  I have advised the patient and his brother to keep this appointment.  Thank you for this referral.   Mikey Bussing, DNP, AGPCNP-BC, AOCNP

## 2020-10-31 NOTE — TOC Progression Note (Signed)
Transition of Care [facility]) - Progression Note    Patient Details  Name: Thomas Garcia MRN: [id number] Date of Birth: 1940-09-28  Transition of [facility]) CM/SW Contact  Loletha Grayer Beverely Pace, RN Phone Number: 10/31/2020, 4:25 PM  Clinical Narrative:   1400: Patient had brother, [name], pay his bill of $253.21 with [employer] via telephone. . 1610: Megan updated CM that she has had no further communication from [place], Since patient has cleared his account with [employer] CM has asked that we provide them with the orders for Hospital Bed, Trapeze Bar and slide Board, so we can facilitate patient in preparing for discharge. CM contacted Freda Munro with [employer]), due to the lateness of the day , bed will likely be delivered tomorrow. Case Manager will update patient's brother, [name].    Expected Discharge Plan: [place] Barriers to Discharge: Equipment Delay  Expected Discharge Plan and Services Expected Discharge Plan: [place] In-house Referral: Clinical Social Work Discharge Planning Services: CM Consult Post Acute Care Choice: Durable Medical Equipment, Home Health Living arrangements for the past 2 months: Single Family Home Expected Discharge Date: 10/28/20               DME Arranged: Hospital bed, Trapeze, Other see comment (transfer/slide board) DME Agency: [employer] Date DME Agency Contacted: 10/31/20 Time DME Agency Contacted: 0160 Representative spoke with at DME Agency: Freda Munro HH Arranged: PT, OT [place] Agency: [place]     Representative spoke with at [place]: Tommi Rumps. PAtient is Active with [place]   Social Determinants of Health (SDOH) Interventions    Readmission Risk Interventions No flowsheet data found.

## 2020-10-31 NOTE — Progress Notes (Signed)
PROGRESS NOTE    Thomas Garcia  YTK:[id number] DOB: 04-27-1940 DOA: 10/21/2020 PCP: Doree Albee, MD   Chief Complain: Right foot infection.  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  Findings are Stefanie Libel of cellulitis/possible necrotizing fasciitis.  He was started on broad-spectrum antibiotics and he underwent AKA on the right side on 7/17 by vascular surgery.  Hospital course also remarkable for anemia, FOBT positive, GI consulted and he underwent EGD and colonoscopy with finding of 3 mass lesions in the proximal small bowel.  Biopsies pending.  Oncology following.  Assessment & Plan:   Principal Problem:   Wound infection after surgery Active Problems:   Benign essential HTN   Essential hypertension   Diabetes mellitus type 2 in nonobese (HCC)   History of pulmonary embolism   Malignant neoplasm of upper lobe of right lung (HCC)   Anemia   Heme positive stool   Protein-calorie malnutrition, severe   Small bowel mass  Proximal small bowel lesions: GI was consulted.  Underwent EGD/colonoscopy with finding of 3 mass lesions in the proximal small bowel, masses noted in the second and third portion of the duodenum.  Colonoscopy did not show any mass lesions.  Oncology following. Abdomen/pelvis CT showed irregular bowel wall thickening within the third portion of the duodenum,irregular bowel wall thickening in several portions of the proximal jejunum. Findings are concerning for malignancy . CT chest also ordered for staging purpose. Patient has history of lung cancer with mets to the brain.  He is noncompliant and has missed appointments.  He will  follow-up with Dr. Delton Coombes in the future  Heme positive stool/normocytic anemia: GI was consulted.  Currently hemoglobin stable.  Status post 2 units of PRBC transfusion.  Necrotic nonhealing right FTD:DUKGURK of bilateral BKA on September 28, 2020 presented with drainage from the incision site on the right BKA wound.  Findings are Stefanie Libel of cellulitis/possible necrotizing fasciitis.  He was started on broad-spectrum antibiotics and he underwent AKA on the right side on 7/17 by vascular surgery. He completed antibiotics course Follow up recommended with vascular surgery in 3 to 4 weeks for suture removal  Leukocytosis: Improving  Hypokalemia: Supplemented with potassium.  Nutrition Problem: Severe Malnutrition Etiology: chronic illness, cancer and cancer related treatments      DVT prophylaxis:SCD Code Status: DNR Family Communication: Brother at bedside Status is: Inpatient  Remains inpatient appropriate because:Unsafe d/c plan  Dispo: The patient is from: Home              Anticipated d/c is to: Home              Patient currently is not medically stable to d/c.   Difficult to place patient No     Consultants: Oncology,GI  Procedures:EGD,colonoscopy  Antimicrobials:  Anti-infectives (From admission, onward)    Start     Dose/Rate Route Frequency Ordered Stop   10/25/20 2200  ceFEPIme (MAXIPIME) 2 g in sodium chloride 0.9 % 100 mL IVPB  Status:  Discontinued        2 g 200 mL/hr over 30 Minutes Intravenous Every 12 hours 10/25/20 0741 10/26/20 1005   10/24/20 1400  ceFEPIme (MAXIPIME) 2 g in sodium chloride  0.9 % 100 mL IVPB  Status:  Discontinued        2 g 200 mL/hr over 30 Minutes Intravenous Every 8 hours 10/24/20 1146 10/25/20 0741   10/24/20 1245  metroNIDAZOLE (FLAGYL) IVPB 500 mg  Status:  Discontinued        500 mg 100 mL/hr over 60 Minutes Intravenous Every 12 hours 10/24/20 1146 10/24/20 1148   10/24/20 1245  metroNIDAZOLE (FLAGYL) IVPB 500 mg  Status:   Discontinued        500 mg 100 mL/hr over 60 Minutes Intravenous Every 8 hours 10/24/20 1148 10/26/20 1005   10/23/20 1400  ceFAZolin (ANCEF) IVPB 1 g/50 mL premix        1 g 100 mL/hr over 30 Minutes Intravenous Every 8 hours 10/23/20 1010 10/23/20 2137   10/22/20 0600  vancomycin (VANCOREADY) IVPB 1250 mg/250 mL  Status:  Discontinued        1,250 mg 166.7 mL/hr over 90 Minutes Intravenous Every 24 hours 10/21/20 1641 10/26/20 1005   10/21/20 2200  piperacillin-tazobactam (ZOSYN) IVPB 3.375 g  Status:  Discontinued        3.375 g 12.5 mL/hr over 240 Minutes Intravenous Every 8 hours 10/21/20 1638 10/24/20 1146   10/21/20 1245  vancomycin (VANCOCIN) IVPB 1000 mg/200 mL premix        1,000 mg 200 mL/hr over 60 Minutes Intravenous  Once 10/21/20 1237 10/21/20 1500   10/21/20 1245  piperacillin-tazobactam (ZOSYN) IVPB 3.375 g        3.375 g 100 mL/hr over 30 Minutes Intravenous  Once 10/21/20 1237 10/21/20 1508       Subjective: Patient seen and examined at bedside this morning.  Comfortable.  Eating his lunch.  Brother at the bedside.  Denies any complaints.  Very eager to go home  Objective: Vitals:   10/30/20 1743 10/30/20 2033 10/31/20 0448 10/31/20 0947  BP: (!) 137/43 121/63 125/65 (!) 104/44  Pulse: 85 80 94 83  Resp: 18 18 18 18   Temp: 98.1 F (36.7 C) 98.2 F (36.8 C) 98.3 F (36.8 C) 98.7 F (37.1 C)  TempSrc: Oral Oral Oral Oral  SpO2: 98% 98% 97% 97%  Weight:   67.6 kg   Height:        Intake/Output Summary (Last 24 hours) at 10/31/2020 1341 Last data filed at 10/31/2020 0840 Gross per 24 hour  Intake 360 ml  Output 0 ml  Net 360 ml   Filed Weights   10/27/20 2110 10/29/20 0848 10/31/20 0448  Weight: 66.7 kg 66.7 kg 67.6 kg    Examination:  General exam: Overall comfortable, not in distress HEENT: PERRL Respiratory system:  no wheezes or crackles  Cardiovascular system: S1 & S2 heard, RRR.  Gastrointestinal system: Abdomen is nondistended, soft and  nontender. Central nervous system: Alert and oriented Extremities: Right AKA, left BKA Skin: No rashes, no ulcers,no icterus       Data Reviewed: I have personally reviewed following labs and imaging studies  CBC: Recent Labs  Lab 10/26/20 0556 10/27/20 0531 10/28/20 0511 10/30/20 0542 10/31/20 0708  WBC 12.3* 13.3* 12.6* 17.1* 13.1*  NEUTROABS  --   --   --   --  10.2*  HGB 10.3* 9.0* 8.1* 8.0* 8.1*  HCT 32.0* 28.6* 25.8* 25.5* 25.5*  MCV 85.3 86.7 88.4 88.2 89.2  PLT 447* 428* 416* 419* 643*   Basic Metabolic Panel: Recent Labs  Lab 10/25/20 0413 10/26/20 0556 10/27/20 0531 10/29/20 0442 10/31/20 0708  NA  139 137 137 138 136  K 3.8 3.7 3.6 3.7 3.3*  CL 109 102 105 106 103  CO2 23 30 28 26 29   GLUCOSE 110* 125* 102* 125* 144*  BUN 13 12 8  5* 7*  CREATININE 0.49* 0.49* 0.45* 0.36* 0.45*  CALCIUM 7.6* 8.0* 8.1* 8.1* 7.7*   GFR: Estimated Creatinine Clearance: 56.9 mL/min (A) (by C-G formula based on SCr of 0.45 mg/dL (L)). Liver Function Tests: No results for input(s): AST, ALT, ALKPHOS, BILITOT, PROT, ALBUMIN in the last 168 hours. No results for input(s): LIPASE, AMYLASE in the last 168 hours. No results for input(s): AMMONIA in the last 168 hours. Coagulation Profile: No results for input(s): INR, PROTIME in the last 168 hours. Cardiac Enzymes: No results for input(s): CKTOTAL, CKMB, CKMBINDEX, TROPONINI in the last 168 hours. BNP (last 3 results) No results for input(s): PROBNP in the last 8760 hours. HbA1C: No results for input(s): HGBA1C in the last 72 hours. CBG: Recent Labs  Lab 10/30/20 1124 10/30/20 1637 10/30/20 2133 10/31/20 0630 10/31/20 1154  GLUCAP 120* 134* 195* 147* 225*   Lipid Profile: No results for input(s): CHOL, HDL, LDLCALC, TRIG, CHOLHDL, LDLDIRECT in the last 72 hours. Thyroid Function Tests: No results for input(s): TSH, T4TOTAL, FREET4, T3FREE, THYROIDAB in the last 72 hours. Anemia Panel: No results for input(s):  VITAMINB12, FOLATE, FERRITIN, TIBC, IRON, RETICCTPCT in the last 72 hours. Sepsis Labs: No results for input(s): PROCALCITON, LATICACIDVEN in the last 168 hours.  Recent Results (from the past 240 hour(s))  Blood culture (routine x 2)     Status: None   Collection Time: 10/21/20  1:52 PM   Specimen: Right Antecubital; Blood  Result Value Ref Range Status   Specimen Description   Final    RIGHT ANTECUBITAL BOTTLES DRAWN AEROBIC AND ANAEROBIC   Special Requests Blood Culture adequate volume  Final   Culture   Final    NO GROWTH 5 DAYS Performed at [facility], [address] [place], [place] [address]    Report Status 10/26/2020 FINAL  Final  Surgical pcr screen     Status: Abnormal   Collection Time: 10/22/20 10:40 PM   Specimen: Nasal Mucosa; Nasal Swab  Result Value Ref Range Status   MRSA, PCR POSITIVE (A) NEGATIVE Final    Comment: RESULT CALLED TO, READ BACK BY AND VERIFIED WITH: E. CASTRO RN, AT [phone] 10/23/20 D. VANHOOK    Staphylococcus aureus POSITIVE (A) NEGATIVE Final    Comment: (NOTE) The Xpert SA Assay (FDA approved for NASAL specimens in patients 21 years of age and older), is one component of a comprehensive surveillance program. It is not intended to diagnose infection nor to guide or monitor treatment. Performed at [facility], [place] [address], [place], [place] [address]          Radiology Studies: CT ABDOMEN PELVIS W CONTRAST  Result Date: 10/29/2020 CLINICAL DATA:  Metastatic disease evaluation EXAM: CT ABDOMEN AND PELVIS WITH CONTRAST TECHNIQUE: Multidetector CT imaging of the abdomen and pelvis was performed using the standard protocol following bolus administration of intravenous contrast. CONTRAST:  10mL OMNIPAQUE IOHEXOL 300 MG/ML  SOLN COMPARISON:  June 27, 2020 FINDINGS: Lower chest: Small LEFT pleural effusion. Trace RIGHT pleural effusion. New irregular consolidative opacity of the lingula. LEFT basilar atelectasis. Unchanged ground-glass  opacity of the RIGHT lower lobe measuring 7 mm (series 4, image 10). Increasesd centrilobular ground-glass within the RIGHT middle lobe. Hepatobiliary: No focal liver abnormality is seen. No gallstones,  gallbladder wall thickening, or biliary dilatation. Pancreas: Unremarkable. No pancreatic ductal dilatation or surrounding inflammatory changes. Spleen: Unchanged appearance of a partially calcified lobulated spleen in the LEFT upper quadrant. Adrenals/Urinary Tract: Nodular thickening of the LEFT adrenal gland, unchanged. RIGHT adrenal gland is unremarkable. No hydronephrosis. LEFT-sided renal cysts. Bladder stone. Stomach/Bowel: In the third portion of the duodenum, there is focal irregular bowel wall thickening anteriorly. It measures approximately 2.6 x 1.1 cm (series 3, image 35). The folds of the proximal jejunum are thickened in appearance with several areas of additional focal irregular bowel wall thickening within the jejunum (series 3, image 33 cyst with series 6, image 50-60; series 3, image 45 with series 6, image 85). Appendix is normal. No evidence of bowel obstruction. Vascular/Lymphatic: Atherosclerotic calcifications of the nonaneurysmal aorta. IVC filter. Reproductive: Prostatomegaly with marked hypertrophy of the median lobe with associated calcification. Other: Trace free fluid.  No free air. Musculoskeletal: Osteopenia.  No aggressive osseous lesion. IMPRESSION: 1. There is irregular bowel wall thickening within the third portion of the duodenum. In addition, there is irregular bowel wall thickening in several portions of the proximal jejunum. Findings are concerning for malignancy with differential considerations including multifocal small-bowel adenocarcinoma, lymphoma or metastatic disease from known lung primary. 2. No evidence of bowel obstruction. 3. New irregular consolidation of the lingula and scattered ground-glass opacities of the RIGHT middle lobe. Findings are likely infectious or  inflammatory in etiology. Recommend attention on follow-up as per clinical protocol. 4. Small LEFT pleural effusion and trace RIGHT pleural effusion. Small volume ascites. 5. Prostatomegaly with revisualization of a bladder stone. Aortic Atherosclerosis (ICD10-I70.0). Electronically Signed   By: Valentino Saxon MD   On: 10/29/2020 20:08        Scheduled Meds:  (feeding supplement) PROSource Plus  30 mL Oral BID BM   sodium chloride   Intravenous Once   atorvastatin  10 mg Oral q1800   docusate sodium  100 mg Oral Daily   feeding supplement  1 Container Oral TID BM   ferrous sulfate  324 mg Oral Q breakfast   insulin aspart  0-9 Units Subcutaneous TID WC   levETIRAcetam  500 mg Oral BID   multivitamin with minerals  1 tablet Oral Daily   nicotine  7 mg Transdermal Daily   nystatin  5 mL Oral QID   oxyCODONE  10 mg Oral Q12H   pantoprazole  40 mg Oral Q0600   Continuous Infusions:   LOS: 10 days    Time spent: 25 mins.More than 50% of that time was spent in counseling and/or coordination of care.      Shelly Coss, MD [facility] P7/25/2022, 1:41 PM

## 2020-10-31 NOTE — TOC Progression Note (Addendum)
Transition of Care [facility]) - Progression Note    Patient Details  Name: ISIAAH CUERVO MRN: [id number] Date of Birth: [birth year]  [facility] CM/SW Contact  Loletha Grayer Beverely Pace, RN Phone Number: 10/31/2020, 10:04 AM  Clinical Narrative:  Case manager contacted patient's brother- Vikram Tillett [phone], to discuss DME. [name] states that he has spoken with someone at Dr. Elyn Peers who is assisting with getting hospital bed with trapeze, and a slide board. CM called the office, [phone] to confirm and had to leave a VM. Case manager contacted [place] to see if they had orders for patient, and was informed that patient has outstanding balance of $253.21 with them and they will not be able to provide equipment unless paid. CM waiting for return call from [facility] @ MD office.  1045: CM spoke with Jinny Blossom at Dr. Lanice Shirts office, she states she has contacted DME companies and no one has bed available. CM gave her update from [facility]. CM contacted [facility] again and ws told that if patient pays outstanding balance they can deliver DME. CM contacted patient's brother, [name] and provided him this information and contact number to call with payment.  1400: Patient had brother pay bill with [facility]. 1610: Megan updated CM that she has had no further communication from [place], Since patient has cleared his account with [facility] CM has asked that we provide them with the orders for Hospital Bed, Trapeze Bar and slide Board, so we can facilitate patient in preparing for discharge. CM contacted Freda Munro with [facility]), due to the late hour, bed will likely be delivered tomorrow. Case Manager will update patient's brother, [name].    Expected Discharge Plan: [place] Barriers to Discharge: Continued Medical Work up  Expected Discharge Plan and Services Expected Discharge Plan: [place] In-house Referral: Clinical  Social Work Discharge Planning Services: CM Consult Post Acute Care Choice: Home Health, Durable Medical Equipment Living arrangements for the past 2 months: [place] Expected Discharge Date: 10/28/20               DME Arranged:  (Patient reported talking with his doctor who is getting a hospital bed  and transfer bench for himr) DME Agency: [facility] (Patient reported receving HH services through [place])                   Social Determinants of Health (SDOH) Interventions    Readmission Risk Interventions No flowsheet data found.

## 2020-10-31 NOTE — Telephone Encounter (Signed)
Called [facility] at [phone] and spoke to Angie and canceled the hospital bed order. Spoke to Manuela Schwartz Nurse Case Manager with [facility] and patient paid the bill with [facility] and they are going to proceed with hospital bed order. Angie verbalized an understanding and will have order canceled.

## 2020-11-01 ENCOUNTER — Inpatient Hospital Stay (HOSPITAL_COMMUNITY): Payer: Medicare HMO

## 2020-11-01 ENCOUNTER — Encounter (HOSPITAL_COMMUNITY): Payer: Self-pay | Admitting: Gastroenterology

## 2020-11-01 DIAGNOSIS — I2699 Other pulmonary embolism without acute cor pulmonale: Secondary | ICD-10-CM

## 2020-11-01 DIAGNOSIS — M79605 Pain in left leg: Secondary | ICD-10-CM

## 2020-11-01 DIAGNOSIS — T8149XA Infection following a procedure, other surgical site, initial encounter: Secondary | ICD-10-CM | POA: Diagnosis not present

## 2020-11-01 LAB — GLUCOSE, CAPILLARY
Glucose-Capillary: 204 mg/dL — ABNORMAL HIGH (ref 70–99)
Glucose-Capillary: 155 mg/dL — ABNORMAL HIGH (ref 70–99)
Glucose-Capillary: 240 mg/dL — ABNORMAL HIGH (ref 70–99)

## 2020-11-01 LAB — CBC
HCT: 24.3 % — ABNORMAL LOW (ref 39.0–52.0)
Hemoglobin: 7.5 g/dL — ABNORMAL LOW (ref 13.0–17.0)
MCH: 28.1 pg (ref 26.0–34.0)
MCHC: 30.9 g/dL (ref 30.0–36.0)
MCV: 91 fL (ref 80.0–100.0)
Platelets: 454 10*3/uL — ABNORMAL HIGH (ref 150–400)
RBC: 2.67 MIL/uL — ABNORMAL LOW (ref 4.22–5.81)
RDW: 21.2 % — ABNORMAL HIGH (ref 11.5–15.5)
WBC: 11 10*3/uL — ABNORMAL HIGH (ref 4.0–10.5)
nRBC: 0 % (ref 0.0–0.2)

## 2020-11-01 LAB — IRON AND TIBC
Iron: 16 ug/dL — ABNORMAL LOW (ref 45–182)
Saturation Ratios: 11 % — ABNORMAL LOW (ref 17.9–39.5)
TIBC: 148 ug/dL — ABNORMAL LOW (ref 250–450)
UIBC: 132 ug/dL

## 2020-11-01 LAB — FERRITIN: Ferritin: 146 ng/mL (ref 24–336)

## 2020-11-01 MED ORDER — SODIUM CHLORIDE 0.9 % IV SOLN
510.0000 mg | Freq: Once | INTRAVENOUS | Status: AC
  Administered 2020-11-01: 510 mg via INTRAVENOUS
  Filled 2020-11-01: qty 17

## 2020-11-01 NOTE — Plan of Care (Signed)
  Problem: Clinical Measurements: Goal: Will remain free from infection Outcome: Progressing   Problem: Activity: Goal: Risk for activity intolerance will decrease Outcome: Progressing   Problem: Pain Managment: Goal: General experience of comfort will improve Outcome: Progressing   

## 2020-11-01 NOTE — TOC Transition Note (Signed)
Transition of Care [facility]) - CM/SW Discharge Note   Patient Details  Name: Thomas Garcia MRN: [id number] Date of Birth: 03-24-1941  Transition of Care [facility]) CM/SW Contact:  [name], Renea Ee, RN Phone Number: 11/01/2020, 4:44 PM   Clinical Narrative:    1500 Georgina Snell from [place] notified that patient is medically ready for discharge and will be going home today. 15:50- Case Manager contacted brother Jerrald Doverspike about delivery of equipments.Spoke with brother and he stated bed and sliding board has been delivered but the trapeze will be delivered at a later time.Brother agreed for patient to be transported home at this time. Brother confirmed address for transport which is [address] [place], [place] [address] . Gold DNR, Face sheet and Medical Necessity placed in the chart. [facility] called and got patient on the list for transport. Nurse made aware. No further needs from Case Management at this time.   Final next level of care: [place] Barriers to Discharge: Barriers Resolved   Patient Goals and CMS Choice Patient states their goals for this hospitalization and ongoing recovery are:: To go home CMS Medicare.gov Compare Post Acute Care list provided to:: Other (Comment Required) (Not needed at this time. Patient indicated [place] used before) Choice offered to / list presented to : NA  Discharge Placement                  Name of family member notified: Farron Watrous - Brother Patient and family notified of of transfer: 11/01/20  Discharge Plan and Services In-house Referral: Clinical Social Work Discharge Planning Services: CM Consult Post Acute Care Choice: Home Health          DME Arranged: Hospital bed, Trapeze, Other see comment (transfer/slide board) DME Agency: [facility] Date DME Agency Contacted: 10/31/20 Time DME Agency Contacted: [phone] Representative spoke with at DME Agency: Freda Munro HH Arranged: PT, OT [place] Agency: [place] Date [place]: 11/01/20 Time [place]: 1500 Representative spoke with at [place]: [place] ([place]) Interventions     Readmission Risk Interventions Readmission Risk Prevention Plan 11/01/2020  Transportation Screening Complete  Medication Review [employer]) Complete  PCP or Specialist appointment within 3-5 days of discharge Complete  HRI or [place] Complete  SW Recovery Care/Counseling Consult Complete  [place] Not Applicable  Some recent data might be hidden

## 2020-11-01 NOTE — Care Management Important Message (Signed)
Important Message  Patient Details  Name: Thomas Garcia MRN: [id number] Date of Birth: 1940-07-05   Medicare Important Message Given:  Yes - Important Message mailed due to current National Emergency   Verbal consent obtained due to current National Emergency  Relationship to patient: Self Contact Name: Fedor Call Date: 11/01/20  Time: 1255 Phone: [phone] Outcome: No Answer/Busy Important Message mailed to: Patient address on file    Delorse Lek 11/01/2020, 12:56 PM

## 2020-11-01 NOTE — Discharge Summary (Addendum)
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  Findings were consistent with cellulitis/possible necrotizing fasciitis.  He was started on broad-spectrum antibiotics and he underwent AKA on the right side on 7/17 by vascular surgery.  Hospital course also remarkable for anemia, FOBT positive, GI consulted and he underwent EGD and colonoscopy with finding of 3 mass lesions in the proximal small bowel.  Biopsies pending.  Oncology were following. Screening CT chest done for staging purpose also showed Large right upper lobe mass ,Incidental note of segmental to subsegmental pulmonary embolus in the left lung. Likely old emboli. Venous Doppler of bilateral lower extremities showed non obstructing chronic deep vein thrombosis involving the left common femoral vein, and left femoral vein.  There was no DVT as per ultrasound on 3/22.  Since patient is not a candidate for anticoagulation, oncology recommended IVC filter placement, IR consulted for IVC filter but we found that he already has an IVC filter placed in 06/2020.  Patient is medically stable for discharge today to home.  He will follow-up with Dr. Delton Coombes, oncology for further management of his malignancy.  Following problems were addressed during his hospitalization:   Proximal small bowel lesions: GI was consulted.  Underwent  EGD/colonoscopy with finding of 3 mass lesions in the proximal small bowel, masses noted in the second and third portion of the duodenum.  Colonoscopy did not show any mass lesions.  Oncology following. Abdomen/pelvis CT showed irregular bowel wall thickening within the third portion of the duodenum,irregular bowel wall thickening in several portions of the proximal jejunum. Findings are concerning for malignancy . CT chest also ordered for staging purpose:Large right upper lobe mass  Patient has history of lung cancer with mets to the brain.  He is noncompliant and has missed appointments.  He will follow-up with Dr. Delton Coombes in the future   Heme positive stool/normocytic anemia:   Hb in the range of 7.  Status post 2 units of PRBC transfusion during this hospitalization. Iron studies showed iron deficiency.  Given a dose of IV iron.  He needs to continue oral supplementation on discharge. We recommend to follow-up with PCP in a week to check hemoglobin.  PE:He has history of pulmonary embolism not on anticoagulation because he had declined treatment at that time of diagnosis. CT chest done for staging purpose also showed Large right upper lobe mass ,Incidental note of segmental to subsegmental pulmonary embolus in the left lung. Likely old emboli. Venous Doppler of bilateral lower extremities showed non obstructing chronic deep vein thrombosis involving the left common femoral vein, and left femoral vein.  There was no DVT as per ultrasound on 3/22.  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 He presented with drainage from the incision  site on the right BKA wound.  Findings are consistent of cellulitis/possible necrotizing fasciitis.  He was started on broad-spectrum  antibiotics and he underwent AKA on the right side on 7/17 by vascular surgery. He completed antibiotics course Follow up recommended with vascular surgery in 2-3 weeks for suture removal. Will continue Plavix on discharge because of history of peripheral vascular disease.   Leukocytosis: Improved   Hypokalemia: Supplemented with potassium.  Disposition: PT recommended home health, OT recommended skilled nursing facility.  Patient is very eager to go home with home health.  His brother will pick him up    Discharge Diagnoses:  Principal Problem:   Wound infection after surgery Active Problems:   Benign essential HTN   Essential hypertension   Diabetes mellitus type 2 in nonobese (HCC)   History of pulmonary embolism   Malignant neoplasm of upper lobe of right lung (HCC)   Anemia   Heme positive stool   Protein-calorie malnutrition, severe   Small bowel mass    Discharge Instructions  Discharge Instructions     Diet general   Complete by: As directed    Discharge instructions   Complete by: As directed    1)Please follow up with oncology as an outpatient.  2)Follow up with your PCP in a week. Do a CBC test in a week to check Hb   Increase activity slowly   Complete by: As directed    No wound care   Complete by: As directed       Allergies as of 11/01/2020   No Known Allergies      Medication List     TAKE these medications    acetaminophen 325 MG tablet Commonly known as: TYLENOL Take 2 tablets (650 mg total) by mouth every 4 (four) hours as needed for headache or mild pain. What changed: when to take this   atorvastatin 10 MG tablet Commonly known as: LIPITOR Take 1 tablet (10 mg total) by mouth daily at 6 PM.   clopidogrel 75 MG tablet Commonly known as: PLAVIX Take 1 tablet (75 mg total) by mouth daily.   ferrous sulfate 324 MG Tbec Take 324 mg by mouth daily with breakfast.   levETIRAcetam 500 MG tablet Commonly known as: KEPPRA Take 1  tablet (500 mg total) by mouth 2 (two) times daily.   loperamide 2 MG capsule Commonly known as: IMODIUM Take 1 capsule (2 mg total) by mouth as needed for diarrhea or loose stools.   metFORMIN 500 MG tablet Commonly known as: GLUCOPHAGE Take 1 tablet (500 mg total) by mouth 2 (two) times daily with a meal.   methocarbamol 500 MG tablet Commonly known as: ROBAXIN Take 1 tablet (500 mg total) by mouth every 6 (six) hours as needed for muscle spasms.   Narcan 4 MG/0.1ML Liqd nasal spray kit Generic drug: naloxone SMARTSIG:Spray(s) Both Nares   oxyCODONE 10 mg 12 hr tablet Commonly known as: OxyCONTIN Take 1 tablet (10 mg total) by mouth every 12 (twelve) hours.   oxyCODONE-acetaminophen 10-325 MG tablet Commonly known as: PERCOCET Take 1 tablet by mouth every 4 (four) hours as needed.   pantoprazole 40 MG tablet Commonly known as: PROTONIX Take 1 tablet (40 mg total) by mouth daily at 6 (six) AM.   Rolaids Advanced 1000-200-40 MG Chew Generic drug: Cal Carb-Mag Hydrox-Simeth Chew 1 tablet by mouth daily as needed (for indigestion).   traZODone 50 MG tablet Commonly known as: DESYREL Take 1 tablet (50 mg total) by mouth at  bedtime.               Durable Medical Equipment  (From admission, onward)           Start     Ordered   10/31/20 1614  For home use only DME Hospital bed  Once       Question Answer Comment  Length of Need Lifetime   Bed type Semi-electric      10/31/20 1614   10/31/20 1609  For home use only DME Other see comment  Once       Comments: Patient needs transfer/slide board  Question:  Length of Need  Answer:  Lifetime   10/31/20 1609   10/31/20 1608  For home use only DME Hospital bed  Once       Question Answer Comment  Length of Need 12 Months   Patient has (list medical condition): s/p Right AKA, generalized weakness   The above medical condition requires: Patient requires the ability to reposition frequently   Head must be  elevated greater than: 45 degrees   Bed type Semi-electric   Trapeze Bar Yes      10/31/20 1609            Follow-up Information     Marty Heck, MD Follow up.   Specialty: Vascular Surgery Why: office will call you to arrange your appt (sent) Contact information: [place] [address] [place], [facility] Follow up.   Specialty: [place] Why: A representative from [facility] will contact you to arrange start date and time for your therapy. Contact information: [place] [address] [phone]         Derek Jack, MD. Schedule an appointment as soon as possible for a visit in 2 week(s).   Specialty: Hematology Why: You will be called for appointment Contact information: [place] [address] [phone]                No Known Allergies  Consultations: Vascular surgery,oncology   Procedures/Studies: DG Knee 1-2 Views Right  Result Date: 10/21/2020 CLINICAL DATA:  Postop for below the knee amputation with cellulitis. EXAM: RIGHT KNEE - 1-2 VIEW COMPARISON:  09/22/2020 CT FINDINGS: Interval below the knee amputation with surgical clips. Soft tissue swelling and gas about the surgical site. No gross osseous destruction. Bone fragment anterior and lateral to the distal fibula. Degenerative changes of the mediolateral compartments of the knee. Vascular calcifications. IMPRESSION: Soft tissue swelling and gas about the surgical site, status post below the knee a amputation. Findings suggest cellulitis and possible necrotizing fasciitis. No gross osseous destruction to confirm recurrent osteomyelitis. Electronically Signed   By: Abigail Miyamoto M.D.   On: 10/21/2020 13:28   CT CHEST W CONTRAST  Result Date: 10/31/2020 CLINICAL DATA:  Colorectal cancer staging EXAM: CT CHEST WITH CONTRAST TECHNIQUE: Multidetector CT imaging of the chest was  performed during intravenous contrast administration. CONTRAST:  90mL OMNIPAQUE IOHEXOL 300 MG/ML  SOLN COMPARISON:  CT abdomen pelvis, 10/29/2020, CT chest abdomen pelvis, 06/28/2020 FINDINGS: Cardiovascular: Incidental note of segmental to subsegmental pulmonary embolus in the left lung (series 3, image 68, 59).). Normal heart size. Left coronary artery calcifications. No pericardial effusion. Aortic atherosclerosis. Mediastinum/Nodes: Enlarged right hilar/subcarinal lymph nodes are diminished in size, largest node measuring 2.6 x 1.5 cm, previously 3.3 x 2.3 cm (series 3, image 50). Thyroid gland,  trachea, and esophagus demonstrate no significant findings. Lungs/Pleura: Large right upper lobe mass is somewhat diminished in size compared to prior staging examination dated 06/27/2020, measuring approximately 6.0 x 5.4 cm, previously 8.0 x 6.4 cm (series 5, image 44). New moderate left, small right pleural effusions and associated atelectasis or consolidation. Upper Abdomen: No acute abnormality. Musculoskeletal: No chest wall mass or suspicious bone lesions identified. IMPRESSION: 1. Large right upper lobe mass is somewhat diminished in size compared to prior staging examination dated 06/27/2020, measuring approximately 6.0 x 5.4 cm, previously 8.0 x 6.4 cm. 2. Enlarged right hilar/subcarinal lymph nodes are diminished in size, largest node measuring 2.6 x 1.5 cm, previously 3.3 x 2.3 cm. 3. Incidental note of segmental to subsegmental pulmonary embolus in the left lung. 4. New moderate left, small right pleural effusions and associated atelectasis or consolidation. Left-sided effusion is perhaps reactive to pulmonary embolus. 5. Coronary artery disease. These results will be called to the ordering clinician or representative by the Radiologist Assistant, and communication documented in the PACS or [employer]. Aortic Atherosclerosis (ICD10-I70.0). Electronically Signed   By: Lauralyn Primes M.D.   On: 10/31/2020  17:41   CT ABDOMEN PELVIS W CONTRAST  Result Date: 10/29/2020 CLINICAL DATA:  Metastatic disease evaluation EXAM: CT ABDOMEN AND PELVIS WITH CONTRAST TECHNIQUE: Multidetector CT imaging of the abdomen and pelvis was performed using the standard protocol following bolus administration of intravenous contrast. CONTRAST:  5mL OMNIPAQUE IOHEXOL 300 MG/ML  SOLN COMPARISON:  June 27, 2020 FINDINGS: Lower chest: Small LEFT pleural effusion. Trace RIGHT pleural effusion. New irregular consolidative opacity of the lingula. LEFT basilar atelectasis. Unchanged ground-glass opacity of the RIGHT lower lobe measuring 7 mm (series 4, image 10). Increasesd centrilobular ground-glass within the RIGHT middle lobe. Hepatobiliary: No focal liver abnormality is seen. No gallstones, gallbladder wall thickening, or biliary dilatation. Pancreas: Unremarkable. No pancreatic ductal dilatation or surrounding inflammatory changes. Spleen: Unchanged appearance of a partially calcified lobulated spleen in the LEFT upper quadrant. Adrenals/Urinary Tract: Nodular thickening of the LEFT adrenal gland, unchanged. RIGHT adrenal gland is unremarkable. No hydronephrosis. LEFT-sided renal cysts. Bladder stone. Stomach/Bowel: In the third portion of the duodenum, there is focal irregular bowel wall thickening anteriorly. It measures approximately 2.6 x 1.1 cm (series 3, image 35). The folds of the proximal jejunum are thickened in appearance with several areas of additional focal irregular bowel wall thickening within the jejunum (series 3, image 33 cyst with series 6, image 50-60; series 3, image 45 with series 6, image 85). Appendix is normal. No evidence of bowel obstruction. Vascular/Lymphatic: Atherosclerotic calcifications of the nonaneurysmal aorta. IVC filter. Reproductive: Prostatomegaly with marked hypertrophy of the median lobe with associated calcification. Other: Trace free fluid.  No free air. Musculoskeletal: Osteopenia.  No  aggressive osseous lesion. IMPRESSION: 1. There is irregular bowel wall thickening within the third portion of the duodenum. In addition, there is irregular bowel wall thickening in several portions of the proximal jejunum. Findings are concerning for malignancy with differential considerations including multifocal small-bowel adenocarcinoma, lymphoma or metastatic disease from known lung primary. 2. No evidence of bowel obstruction. 3. New irregular consolidation of the lingula and scattered ground-glass opacities of the RIGHT middle lobe. Findings are likely infectious or inflammatory in etiology. Recommend attention on follow-up as per clinical protocol. 4. Small LEFT pleural effusion and trace RIGHT pleural effusion. Small volume ascites. 5. Prostatomegaly with revisualization of a bladder stone. Aortic Atherosclerosis (ICD10-I70.0). Electronically Signed   By: Meda Klinefelter MD  On: 10/29/2020 20:08   VAS [name] LOWER EXTREMITY VENOUS (DVT)  Result Date: 11/01/2020  Lower Venous DVT Study Patient Name:  Thomas Garcia  Date of Exam:   11/01/2020 Medical Rec #: [id number]        Accession #:    [id number] Date of Birth: [birth year]        Patient Gender: M Patient Age:   80Y Exam Location:  [place] Procedure:      VAS [name] LOWER EXTREMITY VENOUS (DVT) Referring Phys: [id number] Khamani Fairley --------------------------------------------------------------------------------  Indications: Pulmonary embolism, and Pain in left leg.  Risk Factors: Surgery Right AKA on 10/23/20, Left BKA 09/22/18. Comparison Study: 06/28/20- LEV, negative. Performing Technologist: Oda Cogan RDMS, RVT  Examination Guidelines: A complete evaluation includes B-mode imaging, spectral Doppler, color Doppler, and power Doppler as needed of all accessible portions of each vessel. Bilateral testing is considered an integral part of a complete examination. Limited examinations for reoccurring indications may be performed as noted. The  reflux portion of the exam is performed with the patient in reverse Trendelenburg.  +---------+---------------+---------+-----------+----------+--------------+ RIGHT    CompressibilityPhasicitySpontaneityPropertiesThrombus Aging +---------+---------------+---------+-----------+----------+--------------+ CFV      Full           Yes      Yes                                 +---------+---------------+---------+-----------+----------+--------------+ SFJ      Full                                                        +---------+---------------+---------+-----------+----------+--------------+ FV Prox  Full                                                        +---------+---------------+---------+-----------+----------+--------------+ FV Mid   Full                                                        +---------+---------------+---------+-----------+----------+--------------+ FV DistalFull           Yes      Yes                                 +---------+---------------+---------+-----------+----------+--------------+ PFV      Full                                                        +---------+---------------+---------+-----------+----------+--------------+ POP  AKA            +---------+---------------+---------+-----------+----------+--------------+   +---------+---------------+---------+-----------+----------+--------------+ LEFT     CompressibilityPhasicitySpontaneityPropertiesThrombus Aging +---------+---------------+---------+-----------+----------+--------------+ CFV      Partial        Yes      Yes                  Chronic        +---------+---------------+---------+-----------+----------+--------------+ SFJ      Partial                                      Chronic        +---------+---------------+---------+-----------+----------+--------------+ FV Prox  Full                                                         +---------+---------------+---------+-----------+----------+--------------+ FV Mid   Full                                                        +---------+---------------+---------+-----------+----------+--------------+ FV DistalFull                                                        +---------+---------------+---------+-----------+----------+--------------+ PFV      Full                                                        +---------+---------------+---------+-----------+----------+--------------+ POP      Full           Yes      Yes                                 +---------+---------------+---------+-----------+----------+--------------+ PTV                                                   BKA            +---------+---------------+---------+-----------+----------+--------------+     Summary: RIGHT: - There is no evidence of deep vein thrombosis in the lower extremity.  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  *See table(s) above for measurements and observations.    Preliminary       Subjective: Patient seen and examined the bedside this morning.  Hemodynamically stable.  Eager to go home.  I called the brother to inform about the discharge planning, call not received   Discharge Exam: Vitals:   11/01/20 0536 11/01/20 0923  BP: 128/61 (!) 106/52  Pulse: 79 84  Resp:  16 18  Temp: 98.2 F (36.8 C) 98.5 F (36.9 C)  SpO2: 94% 100%   Vitals:   10/31/20 1806 10/31/20 2122 11/01/20 0536 11/01/20 0923  BP: (!) 110/41 (!) 103/46 128/61 (!) 106/52  Pulse: 96 81 79 84  Resp: $Remo'18 19 16 18  'LJhCN$ Temp: 98.8 F (37.1 C) 98.2 F (36.8 C) 98.2 F (36.8 C) 98.5 F (36.9 C)  TempSrc: Oral Oral Oral Oral  SpO2: 100% 93% 94% 100%  Weight:      Height:        General: Pt is alert, awake, not in acute distress, chronically  looking Cardiovascular: RRR, S1/S2 +, no rubs, no gallops Respiratory: CTA bilaterally, no wheezing, no rhonchi Abdominal: Soft, NT, ND, bowel sounds + Extremities: no edema, no cyanosis, left BKA, right AKA    The results of significant diagnostics from this hospitalization (including imaging, microbiology, ancillary and laboratory) are listed below for reference.     Microbiology: Recent Results (from the past 240 hour(s))  Surgical pcr screen     Status: Abnormal   Collection Time: 10/22/20 10:40 PM   Specimen: Nasal Mucosa; Nasal Swab  Result Value Ref Range Status   MRSA, PCR POSITIVE (A) NEGATIVE Final    Comment: RESULT CALLED TO, READ BACK BY AND VERIFIED WITH: E. CASTRO RN, AT [phone] 10/23/20 D. VANHOOK    Staphylococcus aureus POSITIVE (A) NEGATIVE Final    Comment: (NOTE) The Xpert SA Assay (FDA approved for NASAL specimens in patients 54 years of age and older), is one component of a comprehensive surveillance program. It is not intended to diagnose infection nor to guide or monitor treatment. Performed at [facility], [place] [address] [place], [place] [address]      Labs: BNP (last 3 results) No results for input(s): BNP in the last 8760 hours. Basic Metabolic Panel: Recent Labs  Lab 10/26/20 0556 10/27/20 0531 10/29/20 0442 10/31/20 0708  NA 137 137 138 136  K 3.7 3.6 3.7 3.3*  CL 102 105 106 103  CO2 $Re'30 28 26 29  'UaJ$ GLUCOSE 125* 102* 125* 144*  BUN 12 8 5* 7*  CREATININE 0.49* 0.45* 0.36* 0.45*  CALCIUM 8.0* 8.1* 8.1* 7.7*   Liver Function Tests: No results for input(s): AST, ALT, ALKPHOS, BILITOT, PROT, ALBUMIN in the last 168 hours. No results for input(s): LIPASE, AMYLASE in the last 168 hours. No results for input(s): AMMONIA in the last 168 hours. CBC: Recent Labs  Lab 10/27/20 0531 10/28/20 0511 10/30/20 0542 10/31/20 0708 11/01/20 0542  WBC 13.3* 12.6* 17.1* 13.1* 11.0*  NEUTROABS  --   --   --  10.2*  --   HGB 9.0* 8.1* 8.0* 8.1*  7.5*  HCT 28.6* 25.8* 25.5* 25.5* 24.3*  MCV 86.7 88.4 88.2 89.2 91.0  PLT 428* 416* 419* 449* 454*   Cardiac Enzymes: No results for input(s): CKTOTAL, CKMB, CKMBINDEX, TROPONINI in the last 168 hours. BNP: Invalid input(s): POCBNP CBG: Recent Labs  Lab 10/31/20 1154 10/31/20 1621 10/31/20 2123 11/01/20 0655 11/01/20 1139  GLUCAP 225* 169* 204* 204* 155*   D-Dimer No results for input(s): DDIMER in the last 72 hours. Hgb A1c No results for input(s): HGBA1C in the last 72 hours. Lipid Profile No results for input(s): CHOL, HDL, LDLCALC, TRIG, CHOLHDL, LDLDIRECT in the last 72 hours. Thyroid function studies No results for input(s): TSH, T4TOTAL, T3FREE, THYROIDAB in the last 72 hours.  Invalid input(s): FREET3 Anemia work up [employer]    11/01/20  0542  FERRITIN 146  TIBC 148*  IRON 16*   Urinalysis    Component Value Date/Time   COLORURINE YELLOW 06/27/2020 0615   APPEARANCEUR CLEAR 06/27/2020 0615   LABSPEC 1.016 06/27/2020 0615   PHURINE 5.0 06/27/2020 0615   GLUCOSEU 50 (A) 06/27/2020 0615   HGBUR NEGATIVE 06/27/2020 0615   HGBUR trace-intact 10/26/2008 0000   BILIRUBINUR NEGATIVE 06/27/2020 0615   KETONESUR 5 (A) 06/27/2020 0615   PROTEINUR 100 (A) 06/27/2020 0615   UROBILINOGEN 0.2 04/27/2012 1142   NITRITE NEGATIVE 06/27/2020 0615   LEUKOCYTESUR NEGATIVE 06/27/2020 0615   Sepsis Labs Invalid input(s): PROCALCITONIN,  WBC,  LACTICIDVEN Microbiology Recent Results (from the past 240 hour(s))  Surgical pcr screen     Status: Abnormal   Collection Time: 10/22/20 10:40 PM   Specimen: Nasal Mucosa; Nasal Swab  Result Value Ref Range Status   MRSA, PCR POSITIVE (A) NEGATIVE Final    Comment: RESULT CALLED TO, READ BACK BY AND VERIFIED WITH: E. CASTRO RN, AT [phone] 10/23/20 D. VANHOOK    Staphylococcus aureus POSITIVE (A) NEGATIVE Final    Comment: (NOTE) The Xpert SA Assay (FDA approved for NASAL specimens in patients 74 years of age and older), is  one component of a comprehensive surveillance program. It is not intended to diagnose infection nor to guide or monitor treatment. Performed at [facility], [place] [address], [place], [place] [address]     Please note: You were cared for by a hospitalist during your hospital stay. Once you are discharged, your primary care physician will handle any further medical issues. Please note that NO REFILLS for any discharge medications will be authorized once you are discharged, as it is imperative that you return to your primary care physician (or establish a relationship with a primary care physician if you do not have one) for your post hospital discharge needs so that they can reassess your need for medications and monitor your lab values.    Time coordinating discharge: 40 minutes  SIGNED:   Shelly Coss, MD  [facility] 11/01/2020, 2:39 PM Pager [phone]  If 7PM-7AM, please contact night-coverage [email or web address] Password [email or web address]

## 2020-11-01 NOTE — Progress Notes (Signed)
Occupational Therapy Treatment Patient Details Name: Thomas Garcia MRN: [id number] DOB: 1941/03/27 Today's Date: 11/01/2020    History of present illness 80 y.o. male presented to [facility] on 7/15 with nonhealing necrotic R BKA. 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 PMHx significant for lung cancer with brain mets, L BKA, Hx of PE and DM with neuropathy.   OT comments  Pt making progress with functional goals. Session focused on AP transfer to recliner, UB dressing while seated, simulated LB bathing leaning side to side, grooming. Pt required increased time to initiate tasks due to being hyperverbose and easily distracted, however pt easily redirected, pleasant and cooperative. OT will continue to follow acutely to maximize level of function and safety  Follow Up Recommendations  SNF;Other (comment);Supervision/Assistance - 24 hour (max HH services if refusing SNF)    Equipment Recommendations  Hospital bed    Recommendations for Other Services      Precautions / Restrictions Precautions Precautions: Fall Precaution Comments: R AKA and L BKA Restrictions Weight Bearing Restrictions: Yes RLE Weight Bearing: Non weight bearing LLE Weight Bearing: Non weight bearing       Mobility Bed Mobility Overal bed mobility: Needs Assistance       Supine to sit: HOB elevated;Supervision     General bed mobility comments: able to come to longsitting with use of bilateral rails, and roll bilaterally for placement of clean pad, increasd time and effort for long sitting and to rotate hips to scoot to EOB posteriorly, required therapist assisting by using pads underneath pt    Transfers Overall transfer level: Needs assistance Equipment used: None Transfers: Comptroller transfers: Mod assist   General transfer comment: mod A to used pads to assist pt scooting hips back onto recliner    Balance Overall balance assessment: Needs  assistance Sitting-balance support: Bilateral upper extremity supported Sitting balance-Leahy Scale: Fair         Standing balance comment: L AKA, R BKA                           ADL either performed or assessed with clinical judgement   ADL Overall ADL's : Needs assistance/impaired Eating/Feeding: Sitting;Set up   Grooming: Wash/dry hands;Wash/dry face;Set up;Sitting       Lower Body Bathing: Moderate assistance;Minimal assistance;Sitting/lateral leans Lower Body Bathing Details (indicate cue type and reason): simulated Upper Body Dressing : Set up;Supervision/safety;Sitting       Toilet Transfer: Moderate assistance Toilet Transfer Details (indicate cue type and reason): simulated by AP transfer to recliner                 Vision Baseline Vision/History: Wears glasses Patient Visual Report: No change from baseline     Perception     Praxis      Cognition Arousal/Alertness: Awake/alert Behavior During Therapy: WFL for tasks assessed/performed Overall Cognitive Status: Impaired/Different from baseline Area of Impairment: Problem solving;Attention                       Following Commands: Follows multi-step commands with increased time Safety/Judgement: Decreased awareness of safety   Problem Solving: Slow processing;Requires verbal cues;Requires tactile cues;Difficulty sequencing General Comments: pt requires increased time to initate AP transfer        Exercises     Shoulder Instructions       General Comments      Pertinent Vitals/ Pain  Pain Assessment: Faces Faces Pain Scale: Hurts a little bit Pain Location: R residual limb Pain Descriptors / Indicators: Grimacing;Guarding;Sore Pain Intervention(s): Monitored during session;Repositioned  Home Living                                          Prior Functioning/Environment              Frequency  Min 2X/week        Progress Toward  Goals  OT Goals(current goals can now be found in the care plan section)  Progress towards OT goals: Progressing toward goals  Acute Rehab OT Goals Patient Stated Goal: To return home.  Plan Discharge plan remains appropriate;Frequency remains appropriate    Co-evaluation                 AM-PAC OT "6 Clicks" Daily Activity     Outcome Measure   Help from another person eating meals?: None Help from another person taking care of personal grooming?: A Little Help from another person toileting, which includes using toliet, bedpan, or urinal?: A Lot Help from another person bathing (including washing, rinsing, drying)?: A Lot Help from another person to put on and taking off regular upper body clothing?: A Little Help from another person to put on and taking off regular lower body clothing?: A Lot 6 Click Score: 16    End of Session    OT Visit Diagnosis: Other abnormalities of gait and mobility (R26.89);Muscle weakness (generalized) (M62.81);Pain Pain - Right/Left: Right Pain - part of body: Leg   Activity Tolerance Patient tolerated treatment well   Patient Left with call bell/phone within reach;in chair;with chair alarm set   Nurse Communication          Time: 4010-2725 OT Time Calculation (min): 23 min  Charges: OT General Charges $OT Visit: 1 Visit OT Treatments $Self Care/Home Management : 8-22 mins $Therapeutic Activity: 8-22 mins     Britt Bottom 11/01/2020, 2:15 PM

## 2020-11-01 NOTE — Progress Notes (Signed)
Oncology short note  Patient is being discharged home per hospital medicine today. 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  Sch 11/07/2020  8:15 AM NEW PATIENT 40 40 min [facility] Thomas Jack, MD

## 2020-11-01 NOTE — Progress Notes (Signed)
Physical Therapy Treatment Patient Details Name: Thomas Garcia MRN: [id number] DOB: [birth year] Today's Date: 11/01/2020    History of Present Illness 80 y.o. male presented to [facility] on 7/15 with nonhealing necrotic R BKA. 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 PMHx significant for lung cancer with brain mets, L BKA, Hx of PE and DM with neuropathy.    PT Comments    Pt is in recliner head hanging forward asleep on entry. Rouses easily however reports confusion due to not knowing discharge plans. Requires increased cuing for getting back to bed via A-P transfer from recliner, maximal multimodal cuing and maxAx2 required to return to bed. Once back in bed assisted pt with calling home to speak with his brother. D/c plan remains appropriate. PT will continue to follow acutely.    Follow Up Recommendations  Home health PT;Supervision for mobility/OOB     Equipment Recommendations  Hospital bed       Precautions / Restrictions Precautions Precautions: Fall Precaution Comments: R AKA and L BKA Restrictions Weight Bearing Restrictions: Yes RLE Weight Bearing: Non weight bearing LLE Weight Bearing: Non weight bearing    Mobility  Bed Mobility Overal bed mobility: Needs Assistance Bed Mobility: Rolling;Sit to Supine Rolling: Supervision   Supine to sit: HOB elevated;Supervision Sit to supine: Supervision;HOB elevated   General bed mobility comments: able to layback in bed with increased cuing    Transfers Overall transfer level: Needs assistance Equipment used: None Transfers: Comptroller transfers: Max assist;+2 physical assistance   General transfer comment: maxAx2 for pad scoot, vc for hand placement to assist in scooting back to bed,        Balance Overall balance assessment: Needs assistance Sitting-balance support: Bilateral upper extremity supported Sitting balance-Leahy Scale: Fair Sitting balance - Comments:  Min A to maintain static sitting at EOB with unilateral vs bilateral UE support on bed surface.       Standing balance comment: L AKA, R BKA                            Cognition Arousal/Alertness: Awake/alert Behavior During Therapy: WFL for tasks assessed/performed Overall Cognitive Status: Impaired/Different from baseline Area of Impairment: Problem solving;Attention                       Following Commands: Follows multi-step commands with increased time Safety/Judgement: Decreased awareness of safety   Problem Solving: Slow processing;Requires verbal cues;Requires tactile cues;Difficulty sequencing General Comments: pt requires increased time and cuing for mobility. Pt report he feels more confused and is not sure what is happening with his discharge plans. Requires assist to problem solve using his cell phone to call his brother.             Pertinent Vitals/Pain Pain Assessment: Faces Faces Pain Scale: Hurts a little bit Pain Location: R residual limb Pain Descriptors / Indicators: Aching;Grimacing;Guarding;Sore Pain Intervention(s): Limited activity within patient's tolerance;Monitored during session;Repositioned     PT Goals (current goals can now be found in the care plan section) Acute Rehab PT Goals Patient Stated Goal: To return home. PT Goal Formulation: With patient Time For Goal Achievement: 11/07/20 Potential to Achieve Goals: Poor Progress towards PT goals: Progressing toward goals    Frequency    Min 3X/week      PT Plan Current plan remains appropriate       AM-PAC PT "6  Clicks" Mobility   Outcome Measure  Help needed turning from your back to your side while in a flat bed without using bedrails?: None Help needed moving from lying on your back to sitting on the side of a flat bed without using bedrails?: A Little Help needed moving to and from a bed to a chair (including a wheelchair)?: Total Help needed standing up  from a chair using your arms (e.g., wheelchair or bedside chair)?: Total Help needed to walk in hospital room?: Total Help needed climbing 3-5 steps with a railing? : Total 6 Click Score: 11    End of Session   Activity Tolerance: Patient tolerated treatment well Patient left: in bed;with bed alarm set;with call bell/phone within reach Nurse Communication: Mobility status PT Visit Diagnosis: Other abnormalities of gait and mobility (R26.89);Muscle weakness (generalized) (M62.81);Pain Pain - Right/Left: Right Pain - part of body: Leg     Time: 1447-1501 PT Time Calculation (min) (ACUTE ONLY): 14 min  Charges:  $Therapeutic Activity: 8-22 mins                     Cam Dauphin B. Migdalia Dk PT, DPT Acute Rehabilitation Services Pager [phone] Office [phone]    [place] 11/01/2020, 3:19 PM

## 2020-11-01 NOTE — Plan of Care (Signed)
  Problem: Clinical Measurements: Goal: Ability to maintain clinical measurements within normal limits will improve Outcome: Adequate for Discharge Goal: Will remain free from infection 11/01/2020 1514 by Dolores Hoose, RN Outcome: Adequate for Discharge 11/01/2020 0802 by Dolores Hoose, RN Outcome: Progressing   Problem: Activity: Goal: Risk for activity intolerance will decrease 11/01/2020 1514 by Dolores Hoose, RN Outcome: Adequate for Discharge 11/01/2020 0802 by Dolores Hoose, RN Outcome: Progressing   Problem: Pain Managment: Goal: General experience of comfort will improve 11/01/2020 1514 by Dolores Hoose, RN Outcome: Adequate for Discharge 11/01/2020 0802 by Dolores Hoose, RN Outcome: Progressing   Problem: Safety: Goal: Ability to remain free from injury will improve Outcome: Adequate for Discharge   Problem: Skin Integrity: Goal: Risk for impaired skin integrity will decrease Outcome: Adequate for Discharge   Problem: Activity: Goal: Ability to perform//tolerate increased activity and mobilize with assistive devices will improve Outcome: Adequate for Discharge   Problem: Clinical Measurements: Goal: Postoperative complications will be avoided or minimized Outcome: Adequate for Discharge   Problem: Self-Care: Goal: Ability to meet self-care needs will improve Outcome: Adequate for Discharge   Problem: Self-Concept: Goal: Ability to maintain and perform role responsibilities to the fullest extent possible will improve Outcome: Adequate for Discharge   Problem: Pain Management: Goal: Pain level will decrease with appropriate interventions Outcome: Adequate for Discharge   Problem: Skin Integrity: Goal: Demonstration of wound healing without infection will improve Outcome: Adequate for Discharge   Problem: Acute Rehab OT Goals (only OT should resolve) Goal: Pt. Will Perform Eating Outcome: Adequate for Discharge Goal: Pt. Will Perform  Grooming Outcome: Adequate for Discharge Goal: Pt. Will Perform Upper Body Bathing Outcome: Adequate for Discharge Goal: Pt. Will Perform Lower Body Bathing Outcome: Adequate for Discharge Goal: Pt. Will Perform Upper Body Dressing Outcome: Adequate for Discharge Goal: Pt. Will Perform Lower Body Dressing Outcome: Adequate for Discharge Goal: Pt. Will Transfer To Toilet Outcome: Adequate for Discharge Goal: Pt. Will Perform Toileting-Clothing Manipulation Outcome: Adequate for Discharge Goal: OT Additional ADL Goal #1 Outcome: Adequate for Discharge   Problem: Acute Rehab PT Goals(only PT should resolve) Goal: Pt Will Go Supine/Side To Sit Outcome: Adequate for Discharge Goal: Pt Will Go Sit To Supine/Side Outcome: Adequate for Discharge Goal: Patient Will Perform Sitting Balance Outcome: Adequate for Discharge Goal: Pt Will Transfer Bed To Chair/Chair To Bed Outcome: Adequate for Discharge   Problem: Malnutrition  (NI-5.2) Goal: Food and/or nutrient delivery Description: Individualized approach for food/nutrient provision. Outcome: Adequate for Discharge

## 2020-11-01 NOTE — Progress Notes (Signed)
Lower ext venous duplex  has been completed. Refer to [facility] under chart review to view preliminary results.   11/01/2020  12:02 PM Jolicia Delira, Bonnye Fava

## 2020-11-02 DIAGNOSIS — Z4781 Encounter for orthopedic aftercare following surgical amputation: Secondary | ICD-10-CM | POA: Diagnosis not present

## 2020-11-02 DIAGNOSIS — S81802D Unspecified open wound, left lower leg, subsequent encounter: Secondary | ICD-10-CM | POA: Diagnosis not present

## 2020-11-02 DIAGNOSIS — D630 Anemia in neoplastic disease: Secondary | ICD-10-CM | POA: Diagnosis not present

## 2020-11-02 DIAGNOSIS — I70221 Atherosclerosis of native arteries of extremities with rest pain, right leg: Secondary | ICD-10-CM | POA: Diagnosis not present

## 2020-11-02 DIAGNOSIS — E1151 Type 2 diabetes mellitus with diabetic peripheral angiopathy without gangrene: Secondary | ICD-10-CM | POA: Diagnosis not present

## 2020-11-02 DIAGNOSIS — D509 Iron deficiency anemia, unspecified: Secondary | ICD-10-CM | POA: Diagnosis not present

## 2020-11-02 DIAGNOSIS — Z89511 Acquired absence of right leg below knee: Secondary | ICD-10-CM | POA: Diagnosis not present

## 2020-11-02 DIAGNOSIS — C3411 Malignant neoplasm of upper lobe, right bronchus or lung: Secondary | ICD-10-CM | POA: Diagnosis not present

## 2020-11-02 DIAGNOSIS — C7931 Secondary malignant neoplasm of brain: Secondary | ICD-10-CM | POA: Diagnosis not present

## 2020-11-02 LAB — SURGICAL PATHOLOGY

## 2020-11-02 NOTE — Progress Notes (Signed)
DISCHARGE NOTE   Thomas Garcia to be discharged Home per MD order. Patient verbalized understanding.  Skin clean, dry and intact without evidence of skin break down, no evidence of skin tears noted. IV catheter discontinued intact. Site without signs and symptoms of complications. Dressing and pressure applied. Pt denies pain at the site currently. No complaints noted.  Patient free of lines, drains, and wounds.   Discharge packet assembled. An After Visit Summary (AVS) was printed and given to the EMS personnel. Patient escorted via stretcher and discharged to home via ambulance.   Babs Sciara, RN

## 2020-11-04 ENCOUNTER — Telehealth: Payer: Self-pay

## 2020-11-04 ENCOUNTER — Telehealth: Payer: Self-pay | Admitting: General Practice

## 2020-11-04 DIAGNOSIS — Z89611 Acquired absence of right leg above knee: Secondary | ICD-10-CM | POA: Diagnosis not present

## 2020-11-04 DIAGNOSIS — C784 Secondary malignant neoplasm of small intestine: Secondary | ICD-10-CM | POA: Diagnosis not present

## 2020-11-04 DIAGNOSIS — C7931 Secondary malignant neoplasm of brain: Secondary | ICD-10-CM | POA: Diagnosis not present

## 2020-11-04 DIAGNOSIS — E1140 Type 2 diabetes mellitus with diabetic neuropathy, unspecified: Secondary | ICD-10-CM | POA: Diagnosis not present

## 2020-11-04 DIAGNOSIS — I70221 Atherosclerosis of native arteries of extremities with rest pain, right leg: Secondary | ICD-10-CM | POA: Diagnosis not present

## 2020-11-04 DIAGNOSIS — C3411 Malignant neoplasm of upper lobe, right bronchus or lung: Secondary | ICD-10-CM | POA: Diagnosis not present

## 2020-11-04 DIAGNOSIS — T8743 Infection of amputation stump, right lower extremity: Secondary | ICD-10-CM | POA: Diagnosis not present

## 2020-11-04 DIAGNOSIS — D630 Anemia in neoplastic disease: Secondary | ICD-10-CM | POA: Diagnosis not present

## 2020-11-04 DIAGNOSIS — E1151 Type 2 diabetes mellitus with diabetic peripheral angiopathy without gangrene: Secondary | ICD-10-CM | POA: Diagnosis not present

## 2020-11-04 NOTE — Telephone Encounter (Signed)
[facility] CSW Progress Notes  Call from [place] [phone]).  Wanted to verify that patient can use [facility] for rides to/from [facility].  Verified that he is enrolled in the program - Transport set up wheelchair ride for upcoming appt at [facility] on Aug 8 - they do need to verify his address and whether he has a wheelchair ramp.  Transport will reach out to patient to discuss these questions.  VM left for [place], [place] Social Worker Phone:  [phone]

## 2020-11-04 NOTE — Telephone Encounter (Signed)
Spoke with [employer], Debbie. Verified mutual patient. Debbie shared concerns. Relayed info to primary palliative team.

## 2020-11-07 ENCOUNTER — Ambulatory Visit (HOSPITAL_COMMUNITY): Payer: Medicare HMO | Admitting: Hematology

## 2020-11-07 DIAGNOSIS — E1140 Type 2 diabetes mellitus with diabetic neuropathy, unspecified: Secondary | ICD-10-CM | POA: Diagnosis not present

## 2020-11-07 DIAGNOSIS — I70221 Atherosclerosis of native arteries of extremities with rest pain, right leg: Secondary | ICD-10-CM | POA: Diagnosis not present

## 2020-11-07 DIAGNOSIS — E1151 Type 2 diabetes mellitus with diabetic peripheral angiopathy without gangrene: Secondary | ICD-10-CM | POA: Diagnosis not present

## 2020-11-07 DIAGNOSIS — T8743 Infection of amputation stump, right lower extremity: Secondary | ICD-10-CM | POA: Diagnosis not present

## 2020-11-07 DIAGNOSIS — D630 Anemia in neoplastic disease: Secondary | ICD-10-CM | POA: Diagnosis not present

## 2020-11-07 DIAGNOSIS — C3411 Malignant neoplasm of upper lobe, right bronchus or lung: Secondary | ICD-10-CM | POA: Diagnosis not present

## 2020-11-07 DIAGNOSIS — C7931 Secondary malignant neoplasm of brain: Secondary | ICD-10-CM | POA: Diagnosis not present

## 2020-11-07 DIAGNOSIS — C784 Secondary malignant neoplasm of small intestine: Secondary | ICD-10-CM | POA: Diagnosis not present

## 2020-11-07 DIAGNOSIS — Z89611 Acquired absence of right leg above knee: Secondary | ICD-10-CM | POA: Diagnosis not present

## 2020-11-09 DIAGNOSIS — C7931 Secondary malignant neoplasm of brain: Secondary | ICD-10-CM | POA: Diagnosis not present

## 2020-11-09 DIAGNOSIS — Z89611 Acquired absence of right leg above knee: Secondary | ICD-10-CM | POA: Diagnosis not present

## 2020-11-09 DIAGNOSIS — E1151 Type 2 diabetes mellitus with diabetic peripheral angiopathy without gangrene: Secondary | ICD-10-CM | POA: Diagnosis not present

## 2020-11-09 DIAGNOSIS — I70221 Atherosclerosis of native arteries of extremities with rest pain, right leg: Secondary | ICD-10-CM | POA: Diagnosis not present

## 2020-11-09 DIAGNOSIS — C3411 Malignant neoplasm of upper lobe, right bronchus or lung: Secondary | ICD-10-CM | POA: Diagnosis not present

## 2020-11-09 DIAGNOSIS — T8743 Infection of amputation stump, right lower extremity: Secondary | ICD-10-CM | POA: Diagnosis not present

## 2020-11-09 DIAGNOSIS — E1140 Type 2 diabetes mellitus with diabetic neuropathy, unspecified: Secondary | ICD-10-CM | POA: Diagnosis not present

## 2020-11-09 DIAGNOSIS — D630 Anemia in neoplastic disease: Secondary | ICD-10-CM | POA: Diagnosis not present

## 2020-11-09 DIAGNOSIS — C784 Secondary malignant neoplasm of small intestine: Secondary | ICD-10-CM | POA: Diagnosis not present

## 2020-11-09 NOTE — Progress Notes (Signed)
This encounter was created in error - please disregard.

## 2020-11-09 NOTE — Addendum Note (Signed)
Addended by: Clerance Lav on: 11/09/2020 07:52 AM   Modules accepted: Orders, Level of Service, SmartSet

## 2020-11-10 ENCOUNTER — Telehealth: Payer: Self-pay | Admitting: Licensed Clinical Social Worker

## 2020-11-10 NOTE — Telephone Encounter (Signed)
TC from home health [facility] to discuss concerns. Wants to get extra support for pt and brother but brother declining hospice currently. Jackelyn Poling will try to have them apply for Medicaid to potentially then get PCS services.    Christeen Douglas, LCSW

## 2020-11-11 DIAGNOSIS — C784 Secondary malignant neoplasm of small intestine: Secondary | ICD-10-CM | POA: Diagnosis not present

## 2020-11-11 DIAGNOSIS — C3411 Malignant neoplasm of upper lobe, right bronchus or lung: Secondary | ICD-10-CM | POA: Diagnosis not present

## 2020-11-11 DIAGNOSIS — E1140 Type 2 diabetes mellitus with diabetic neuropathy, unspecified: Secondary | ICD-10-CM | POA: Diagnosis not present

## 2020-11-11 DIAGNOSIS — D630 Anemia in neoplastic disease: Secondary | ICD-10-CM | POA: Diagnosis not present

## 2020-11-11 DIAGNOSIS — C7931 Secondary malignant neoplasm of brain: Secondary | ICD-10-CM | POA: Diagnosis not present

## 2020-11-11 DIAGNOSIS — Z89611 Acquired absence of right leg above knee: Secondary | ICD-10-CM | POA: Diagnosis not present

## 2020-11-11 DIAGNOSIS — I70221 Atherosclerosis of native arteries of extremities with rest pain, right leg: Secondary | ICD-10-CM | POA: Diagnosis not present

## 2020-11-11 DIAGNOSIS — E1151 Type 2 diabetes mellitus with diabetic peripheral angiopathy without gangrene: Secondary | ICD-10-CM | POA: Diagnosis not present

## 2020-11-11 DIAGNOSIS — T8743 Infection of amputation stump, right lower extremity: Secondary | ICD-10-CM | POA: Diagnosis not present

## 2020-11-14 ENCOUNTER — Inpatient Hospital Stay (HOSPITAL_COMMUNITY): Payer: Medicare HMO | Admitting: Hematology

## 2020-11-15 ENCOUNTER — Telehealth: Payer: Self-pay

## 2020-11-15 DIAGNOSIS — E1151 Type 2 diabetes mellitus with diabetic peripheral angiopathy without gangrene: Secondary | ICD-10-CM | POA: Diagnosis not present

## 2020-11-15 DIAGNOSIS — Z89611 Acquired absence of right leg above knee: Secondary | ICD-10-CM | POA: Diagnosis not present

## 2020-11-15 DIAGNOSIS — C3411 Malignant neoplasm of upper lobe, right bronchus or lung: Secondary | ICD-10-CM | POA: Diagnosis not present

## 2020-11-15 DIAGNOSIS — I70221 Atherosclerosis of native arteries of extremities with rest pain, right leg: Secondary | ICD-10-CM | POA: Diagnosis not present

## 2020-11-15 DIAGNOSIS — T8743 Infection of amputation stump, right lower extremity: Secondary | ICD-10-CM | POA: Diagnosis not present

## 2020-11-15 DIAGNOSIS — C784 Secondary malignant neoplasm of small intestine: Secondary | ICD-10-CM | POA: Diagnosis not present

## 2020-11-15 DIAGNOSIS — D630 Anemia in neoplastic disease: Secondary | ICD-10-CM | POA: Diagnosis not present

## 2020-11-15 DIAGNOSIS — C7931 Secondary malignant neoplasm of brain: Secondary | ICD-10-CM | POA: Diagnosis not present

## 2020-11-15 DIAGNOSIS — E1140 Type 2 diabetes mellitus with diabetic neuropathy, unspecified: Secondary | ICD-10-CM | POA: Diagnosis not present

## 2020-11-15 NOTE — Telephone Encounter (Signed)
410 pm.  Phone call made to patient to schedule a home visit.  No answer but message has been left requesting a call back.

## 2020-11-16 ENCOUNTER — Ambulatory Visit: Payer: Self-pay | Admitting: *Deleted

## 2020-11-16 ENCOUNTER — Telehealth (INDEPENDENT_AMBULATORY_CARE_PROVIDER_SITE_OTHER): Payer: Medicare HMO

## 2020-11-16 DIAGNOSIS — C784 Secondary malignant neoplasm of small intestine: Secondary | ICD-10-CM | POA: Diagnosis not present

## 2020-11-16 DIAGNOSIS — T8743 Infection of amputation stump, right lower extremity: Secondary | ICD-10-CM | POA: Diagnosis not present

## 2020-11-16 DIAGNOSIS — I70221 Atherosclerosis of native arteries of extremities with rest pain, right leg: Secondary | ICD-10-CM | POA: Diagnosis not present

## 2020-11-16 DIAGNOSIS — C7931 Secondary malignant neoplasm of brain: Secondary | ICD-10-CM | POA: Diagnosis not present

## 2020-11-16 DIAGNOSIS — E1151 Type 2 diabetes mellitus with diabetic peripheral angiopathy without gangrene: Secondary | ICD-10-CM | POA: Diagnosis not present

## 2020-11-16 DIAGNOSIS — E1140 Type 2 diabetes mellitus with diabetic neuropathy, unspecified: Secondary | ICD-10-CM | POA: Diagnosis not present

## 2020-11-16 DIAGNOSIS — Z89611 Acquired absence of right leg above knee: Secondary | ICD-10-CM | POA: Diagnosis not present

## 2020-11-16 DIAGNOSIS — D630 Anemia in neoplastic disease: Secondary | ICD-10-CM | POA: Diagnosis not present

## 2020-11-16 DIAGNOSIS — C3411 Malignant neoplasm of upper lobe, right bronchus or lung: Secondary | ICD-10-CM | POA: Diagnosis not present

## 2020-11-16 NOTE — Progress Notes (Signed)
Patient discharged from services due to primary care provider passed away.  Patient transferred to central telephonic team- [employer] case manager to continue to follow.  Jacqlyn Larsen [facility], BSN RN Case Manager [phone]

## 2020-11-17 ENCOUNTER — Other Ambulatory Visit: Payer: Self-pay | Admitting: Physician Assistant

## 2020-11-17 ENCOUNTER — Encounter (HOSPITAL_COMMUNITY): Payer: Self-pay | Admitting: General Practice

## 2020-11-17 ENCOUNTER — Other Ambulatory Visit: Payer: Self-pay

## 2020-11-17 NOTE — Patient Outreach (Signed)
[facility]) Care Management  11/17/2020  Thomas Garcia 1940/06/08 [id number]   Telephone call to patient for follow up from referral from Embedded Nurse at Dr. Lanice Shirts office. Spoke with patient and his brother. Patient states he is doing ok and brother states that things are about the same as when he went to the hospital.  Brother verifies that [name] is coming out to the home for services.  Brother immediately interrupts CM and says he cannot talk as he is busy and ended the call.    Plan: RN CM will attempt again within 4 business days and send letter.    Jone Baseman, RN, MSN [facility] Care Management Coordinator Direct Line [phone] Cell [phone] Toll Free: [phone]  Fax: [phone]

## 2020-11-17 NOTE — Progress Notes (Signed)
[facility] CSW Progress Notes  Call from [place] [facility]  Per [facility] SW, patient missed appointment as "there was no transportation" despite this being previously arranged.  [employer] who confirmed that Betsy Pries was at his location ([place]).  She has clarified directions ("in back of mobile home on the back") with [facility] who will relay to whichever transportation provider has been assigned.  HH SW offered family assistance in setting up VMs to encourage better communication with medical team, family declined this help.    Edwyna Shell, LCSW Clinical Social Worker Phone:  [phone]

## 2020-11-18 DIAGNOSIS — T8743 Infection of amputation stump, right lower extremity: Secondary | ICD-10-CM | POA: Diagnosis not present

## 2020-11-18 DIAGNOSIS — E1151 Type 2 diabetes mellitus with diabetic peripheral angiopathy without gangrene: Secondary | ICD-10-CM | POA: Diagnosis not present

## 2020-11-18 DIAGNOSIS — C784 Secondary malignant neoplasm of small intestine: Secondary | ICD-10-CM | POA: Diagnosis not present

## 2020-11-18 DIAGNOSIS — I70221 Atherosclerosis of native arteries of extremities with rest pain, right leg: Secondary | ICD-10-CM | POA: Diagnosis not present

## 2020-11-18 DIAGNOSIS — E1140 Type 2 diabetes mellitus with diabetic neuropathy, unspecified: Secondary | ICD-10-CM | POA: Diagnosis not present

## 2020-11-18 DIAGNOSIS — Z89611 Acquired absence of right leg above knee: Secondary | ICD-10-CM | POA: Diagnosis not present

## 2020-11-18 DIAGNOSIS — C7931 Secondary malignant neoplasm of brain: Secondary | ICD-10-CM | POA: Diagnosis not present

## 2020-11-18 DIAGNOSIS — C3411 Malignant neoplasm of upper lobe, right bronchus or lung: Secondary | ICD-10-CM | POA: Diagnosis not present

## 2020-11-18 DIAGNOSIS — D630 Anemia in neoplastic disease: Secondary | ICD-10-CM | POA: Diagnosis not present

## 2020-11-21 ENCOUNTER — Other Ambulatory Visit: Payer: Self-pay

## 2020-11-21 ENCOUNTER — Telehealth: Payer: Self-pay

## 2020-11-21 NOTE — Telephone Encounter (Signed)
840 am.  Phone call made to patient to schedule a Palliative Care appt.  Brother Eduard Clos answered and is agreeable to a visit tomorrow at 11 am.

## 2020-11-21 NOTE — Patient Outreach (Signed)
[facility]) Care Management  11/21/2020  Thomas Garcia Nov 11, 1940 [id number]   Spoke with brother Thomas Garcia.  He states patient is kind of in and out and eating but not much.  He acknowledged patient decline recently.  Discussed care needs with brother.  He states that they do need some help.  Discussed palliative care consult and listening to the nurse when she comes.  He is agreeable.  Discussed need for PCP.  He states yes he and his brother used to see Dr. Anastasio Champion.  Information given for [facility]. He states he will call today.  Discussed with Thomas Garcia that we would wait until after palliative care consult if further outreach from CM will be needed.  He is agreeable.    Spoke with Nurse Vance Gather from palliative care. She states he is scheduled for visit tomorrow. Discussed that patient and his brother needs some help and that brother acknowledged that patient has declined some.  Almyra Free will call CM tomorrow after the visit.     Plan: RN CM will wait for update from Palliative care.    Jone Baseman, RN, MSN [place] [facility] Care Management Coordinator Direct Line [phone] Cell [phone] Toll Free: [phone]  Fax: [phone]

## 2020-11-22 ENCOUNTER — Other Ambulatory Visit: Payer: Self-pay

## 2020-11-22 ENCOUNTER — Telehealth: Payer: Self-pay

## 2020-11-22 VITALS — BP 108/48 | HR 92 | Temp 97.7°F | Resp 32

## 2020-11-22 DIAGNOSIS — C3411 Malignant neoplasm of upper lobe, right bronchus or lung: Secondary | ICD-10-CM | POA: Diagnosis not present

## 2020-11-22 DIAGNOSIS — C784 Secondary malignant neoplasm of small intestine: Secondary | ICD-10-CM | POA: Diagnosis not present

## 2020-11-22 DIAGNOSIS — Z89611 Acquired absence of right leg above knee: Secondary | ICD-10-CM | POA: Diagnosis not present

## 2020-11-22 DIAGNOSIS — Z515 Encounter for palliative care: Secondary | ICD-10-CM

## 2020-11-22 DIAGNOSIS — I70221 Atherosclerosis of native arteries of extremities with rest pain, right leg: Secondary | ICD-10-CM | POA: Diagnosis not present

## 2020-11-22 DIAGNOSIS — C7931 Secondary malignant neoplasm of brain: Secondary | ICD-10-CM | POA: Diagnosis not present

## 2020-11-22 DIAGNOSIS — E1140 Type 2 diabetes mellitus with diabetic neuropathy, unspecified: Secondary | ICD-10-CM | POA: Diagnosis not present

## 2020-11-22 DIAGNOSIS — D630 Anemia in neoplastic disease: Secondary | ICD-10-CM | POA: Diagnosis not present

## 2020-11-22 DIAGNOSIS — E1151 Type 2 diabetes mellitus with diabetic peripheral angiopathy without gangrene: Secondary | ICD-10-CM | POA: Diagnosis not present

## 2020-11-22 DIAGNOSIS — T8743 Infection of amputation stump, right lower extremity: Secondary | ICD-10-CM | POA: Diagnosis not present

## 2020-11-22 NOTE — Progress Notes (Signed)
COMMUNITY PALLIATIVE CARE SW NOTE  PATIENT NAME: Thomas Garcia DOB: [birth year] MRN: [id number]  PRIMARY CARE PROVIDER: No primary care provider on file.  RESPONSIBLE PARTY:  Acct ID - Guarantor Home Phone Work Phone Relationship Acct Type  [id number] Bernadene [phone]  Self P/F     [place], [place], [place] [address]     PLAN OF CARE and INTERVENTIONS:             GOALS OF CARE/ ADVANCE CARE PLANNING:  Patient is a DNR. Patient's goal is to remain comfortable in his home   2.         SOCIAL/EMOTIONAL/SPIRITUAL ASSESSMENT/ INTERVENTIONS:  SW and RN met with patient for initial palliative care visit. Patient lives in one level mobile home with his brother.    Patient and brother updated team on medical condition/changes. Patient suffers from tumors/lesions on stomach, brain and other areas in his body. Patient is scheduled to see oncologist Thu 8/18, for initial appointment to discuss stage of cancer and treatment options.   Patient shares that today he feels tired and has pain on the right side his stomach, where the tumor is located. Brother shares patients appetite has declined since his hospitalization at the end of July. Patient has visibly lost weight, per brother, and eats half of his meals. Patient drinks one supplement drink, ensure, a day. Patient is sleeping 80% of the day.    RN reviewed and checked vitals and medications. Patient has oxycodone for pain, however, patient has not been taking it regularly. Patient has been taking Tylenol instead to address pain, but has not been effective. RN discussed pain management with patient and brother and try cutting the $RemoveBef'10mg'kcQOLaVcKJ$  oxycodone in half, if the $Remo'10mg'DQtRH$  is too strong for patient.   [name] RN present during visit and shared that [facility] services will end next week, as patient no longer meets the needs for their services, and feels patient is more hospice appropriate. [name] RN shared that she will outreach vascular  surgeon about possibly removing patients staples from R leg stump (AKA).   Psychosocial needs addressed. Patient has some apparent cognitive deficits, brother shares that he feels patients memory has decreased since hospitalization in July. Patient has adequate food supply. [facility] for transportation to medical appointments. Patients brother lives in the home and has a vehicle. Brother assist with meal prep and ADL's.  Patient in need of new PCP, Brother has contact information to [place] [place] [facility] and shares that he will be outreaching them this week to set up new patient appointments for both him and patient. SW discussed Medicaid with brother. SW will begin application on patient behalf, however brother is aware that additional information, such has patients overall monthly income is needed to complete/submit the application - patient does not remember income and brother is unsure, brother to gather this info and update SW.  PC discussed goals and reviewed care plan. PC discussed patients desires for her care, patient shares that he wants to be comfortable. PC discussed and reviewed Hospice services with patient and brother. Patient wants to attend oncology appointment this Thu and follow up with PC afterwards to review recommendations and plan of care moving forward. SW provided emotional support, used active and reflective listening to patient and brother. Brother appears to be overwhelmed with patients care and overall decline, PC will continue to provide support.  PC will continue to follow and discuss GOC and long term planning.  3.         PATIENT/CAREGIVER EDUCATION/ COPING:  Patient A&O x2 and engaged in discussion. Patient denies any feelings of depression at this time, but states he knows he does have these feeling sometimes. Brother is supportive. Patient is an active smoker and shares that he smokes 5-10 cigarettes a day.   4.         PERSONAL EMERGENCY PLAN:   Patient to call 911 for emergencies.   5.         COMMUNITY RESOURCES COORDINATION/ HEALTH CARE NAVIGATION:  Brother manages care.    6.         FINANCIAL/LEGAL CONCERNS/INTERVENTIONS:  None.      SOCIAL HX:  Social History   Tobacco Use   Smoking status: Former    Packs/day: 0.50    Years: 45.00    Pack years: 22.50    Types: Cigarettes   Smokeless tobacco: Never   Tobacco comments:    4 cigarettes per day  Substance Use Topics   Alcohol use: No    CODE STATUS: DNR ADVANCED DIRECTIVES: N MOST FORM COMPLETE:  N HOSPICE EDUCATION PROVIDED: Y  PPS: Patient is MOD-MAX A with all ADL's. Patient is able to feedself.       Doreene Eland, [place]

## 2020-11-22 NOTE — Progress Notes (Signed)
PATIENT NAME: Thomas Garcia DOB: 14-Feb-1941 MRN: [id number]  PRIMARY CARE PROVIDER: No primary care provider on file.  RESPONSIBLE PARTY:  Acct ID - Guarantor Home Phone Work Phone Relationship Acct Type  [id number] Bernadene [phone]  Self P/F     [address], [place], [place] [id number]    PLAN OF CARE and INTERVENTIONS:               1.  GOALS OF CARE/ ADVANCE CARE PLANNING:  Patient is currently a DNR with form posted on his bedroom door.                 2.  PATIENT/CAREGIVER EDUCATION:  Hospice vs Palliative Care.                4. PERSONAL EMERGENCY PLAN:  Activate 911 for emergencies.               5.  DISEASE STATUS:  Joint visit completed with [place] Perry-SW, Betty-RN with [name], Charlie-brother and patient.  Patient is found in the bed leaning on the left side.  Difficult to awake initially but once awake, patient engages in conversation easily.   Patient will have an initial visit with oncology Thursday.  Brother is working to get patient established with [employer] as previous PCP passed away unexpectedly.  Patient reports pain to his right lower quad.  He states the pain is "intense" at times.  We discussed the use of current pain medication that is ordered. Currently has oxycodone/acetaminophen 10/325 mg present in the home.  Patient has only taken 1 tablet since this was filled.  Reviewed medications and patient does not have oxycodone 10 mg that is ordered q 12 hours.  Brother states there was concern that the amount of opioids prescribed would cause to much sedation.  They also have narcan in the home.  Education provided on use of pain medication to provide pain management for patient.  Brother is concerned if medication should run out then there would be no one to refill this.  Advised we could reach out to oncology regarding refills if they were unable to assist, I would reach out to a provider with Palliative Care.  Brother voiced  understanding.  Patient reports a poor appetite.  Po intake in typically 10-15 bites of food per brother.  Fluid intake is generally good.  Patient is consuming a supplement drink daily.  [facility] RN states they will be discharging patient next week.  She will reach out to surgeon's office regarding staple removal of right aka.  RN also discussed with patient having hospice or palliative care begin to follow him.    Explained hospice vs palliative care to patient.  He feels he would need the support hospice can offer given his current situation.  Patient would like to follow up with oncology first to know the extent of his cancer diagnosis and if there are any treatment options.  Patient also discussed his sister's situation with cancer and how "people helped take the pain away".  Brother confirms their sister did pass.  Advised patient that palliative care would follow up with them after the oncology visit.  We will discuss how visit went and also options presented to patient.  We will also revisit hospice vs palliative care.     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  Patient is being followed by Palliative Care monthly and PRN.  CODE  STATUS: DNR ADVANCED DIRECTIVES: No MOST FORM: No PPS: 30%   PHYSICAL EXAM:   VITALS: Today's Vitals   11/22/20 1103  BP: (!) 108/48  Pulse: 92  Resp: (!) 32  Temp: 97.7 F (36.5 C)  SpO2: 94%  PainSc: 0-No pain    LUNGS: clear to auscultation  CARDIAC: Cor RRR}  EXTREMITIES: no edema present. SKIN: Skin color, texture, turgor normal. No rashes or lesions or staples in place to right aka.   NEURO: positive for gait problems and weakness       Lorenza Burton, RN

## 2020-11-22 NOTE — Telephone Encounter (Signed)
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 The patient has difficulty with transportation. RN says that site looks good - no redness or drainage. Patient had a palliative care/hospice consult today and they will know Thursday if patient will go to hospice. Odelia Gage that I would keep patient's appt for now, and reassess Thursday. If he goes to hospice, [facility] may remove the staples - if not - will ask provider if patient does need to keep appt. Verbalized understanding, Inez Catalina will call Thursday to update.

## 2020-11-23 NOTE — Progress Notes (Signed)
[facility] [address] [place] [address]   CLINIC:  Medical Oncology/Hematology  CONSULT NOTE  Patient Care Team: Pcp, No as PCP - General Brien Mates, RN as Oncology Nurse Navigator (Oncology) Jon Billings, RN as [facility] Derek Jack, MD as Medical Oncologist (Oncology)  CHIEF COMPLAINTS/PURPOSE OF CONSULTATION:  Evaluation of malignant neoplasm of upper lobe of right lung   HISTORY OF PRESENTING ILLNESS:  Mr. Thomas Garcia 80 y.o. male is here because of malignant neoplasm of upper lobe of right lung , at the request of Dr. Burr Medico.  Today he reports feeling fair. He spends most of the day in bed sleeping. His appetite is poor and he eats 1 small meal a day. 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 He under went right leg amputation on 09/28/20 below the knee, then above the knee on 10/23/20. He underwent radiation therapy to the right lung and brain from 08/23/20 to 09/16/20. He reports headaches and double vision, and he denies cough.   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 He is a smoker and has smoked 1 ppd for 40-50 years. He had 2 maternal aunts with unspecified cancers.   MEDICAL HISTORY:  Past Medical History:  Diagnosis Date   Diabetes mellitus without complication ([place])    Kidney stones    Lung cancer ([place]) 2022   PONV (postoperative nausea and vomiting)     SURGICAL HISTORY: Past Surgical History:  Procedure Laterality Date   ABDOMINAL AORTOGRAM W/LOWER EXTREMITY N/A 09/17/2018   Procedure: ABDOMINAL AORTOGRAM W/LOWER EXTREMITY;  Surgeon: Marty Heck, MD;  Location: [facility];  Service: Cardiovascular;  Laterality: N/A;   AMPUTATION Left 09/22/2018   Procedure: AMPUTATION BELOW KNEE LEFT;  Surgeon: Marty Heck, MD;  Location: [facility];  Service: Vascular;  Laterality: Left;   AMPUTATION Right 09/28/2020    Procedure: RIGHT BELOW KNEE AMPUTATION;  Surgeon: Marty Heck, MD;  Location: [facility];  Service: Vascular;  Laterality: Right;   AMPUTATION Right 10/23/2020   Procedure: REVISION OF RIGHT  ABOVE KNEE AMPUTATION;  Surgeon: Marty Heck, MD;  Location: [facility];  Service: Vascular;  Laterality: Right;   AMPUTATION TOE Left 09/10/2018   Procedure: AMPUTATION OF THIRD TOE LEFT FOOT, DEBRIDEMENT OF ULCERS ON SECOND TOE AND PARTIAL AMPUTATION SECOND TOE ON LEFT FOOT;  Surgeon: Virl Cagey, MD;  Location: [facility];  Service: General;  Laterality: Left;   APPLICATION OF CRANIAL NAVIGATION N/A 07/08/2020   Procedure: APPLICATION OF CRANIAL NAVIGATION;  Surgeon: Karsten Ro, DO;  Location: [facility];  Service: Neurosurgery;  Laterality: N/A;   APPLICATION OF WOUND VAC Left 09/18/2018   Procedure: APPLICATION OF WOUND VAC;  Surgeon: Marty Heck, MD;  Location: [facility];  Service: Vascular;  Laterality: Left;   BIOPSY  10/29/2020   Procedure: BIOPSY;  Surgeon: Yetta Flock, MD;  Location: [facility];  Service: Gastroenterology;;   COLONOSCOPY WITH PROPOFOL N/A 10/29/2020   Procedure: COLONOSCOPY WITH PROPOFOL;  Surgeon: Yetta Flock, MD;  Location: [facility];  Service: Gastroenterology;  Laterality: N/A;   CRANIOTOMY Right 07/08/2020   Procedure: Right Occipital craniotomy for tumor resection with brainlab;  Surgeon: Dawley, Theodoro Doing, DO;  Location: [facility];  Service: Neurosurgery;  Laterality: Right;   CYSTOSCOPY  05/08/2012   Procedure: CYSTOSCOPY;  Surgeon: Marissa Nestle, MD;  [facility];  Service: Urology;  Laterality: N/A;  Evacuation of clots   ESOPHAGOGASTRODUODENOSCOPY (EGD) WITH PROPOFOL N/A 10/29/2020   Procedure: ESOPHAGOGASTRODUODENOSCOPY (EGD) WITH PROPOFOL;  Surgeon: Yetta Flock, MD;  Location: [facility];  Service: Gastroenterology;  Laterality: N/A;   IR IVC FILTER PLMT / S&I /IMG GUID/MOD SED  07/05/2020   KIDNEY SURGERY     sutures     PERIPHERAL VASCULAR BALLOON ANGIOPLASTY  09/17/2018   Procedure: PERIPHERAL VASCULAR BALLOON ANGIOPLASTY;  Surgeon: Marty Heck, MD;  Location: [facility];  Service: Cardiovascular;;  LT SFA and AT   RADIOLOGY WITH ANESTHESIA N/A 06/30/2020   Procedure: MRI WITH ANESTHESIA   BRAIN WITH AND WITHOUT CONTRAST;  Surgeon: Radiologist, Medication, MD;  Location: [facility];  Service: Radiology;  Laterality: N/A;   SPLENECTOMY     SUBMUCOSAL TATTOO INJECTION  10/29/2020   Procedure: SUBMUCOSAL TATTOO INJECTION;  Surgeon: Yetta Flock, MD;  [facility]: [facility];  Service: Gastroenterology;;   TRANSMETATARSAL AMPUTATION Left 09/18/2018   Procedure: TRANSMETATARSAL AMPUTATION LEFT FOOT;  Surgeon: Marty Heck, MD;  Location: [facility];  Service: Vascular;  Laterality: Left;   TRANSURETHRAL RESECTION OF PROSTATE  05/14/2012   Procedure: TRANSURETHRAL RESECTION OF THE PROSTATE (TURP);  Surgeon: Marissa Nestle, MD;  Location: [facility];  Service: Urology;  Laterality: N/A;    SOCIAL HISTORY: Social History   Socioeconomic History   Marital status: Legally Separated    Spouse name: Not on file   Number of children: Not on file   Years of education: Not on file   Highest education level: Not on file  Occupational History   Not on file  Tobacco Use   Smoking status: Former    Packs/day: 0.50    Years: 45.00    Pack years: 22.50    Types: Cigarettes   Smokeless tobacco: Never   Tobacco comments:    4 cigarettes per day  Vaping Use   Vaping Use: Never used  Substance and Sexual Activity   Alcohol use: No   Drug use: No   Sexual activity: Not on file  Other Topics Concern   Not on file  Social History Narrative   Divorced since 2011.Lives with brother.Retired,previously maintenance work for [employer]   Social Determinants of Health   Financial Resource Strain: Low Risk    Difficulty of Paying Living Expenses: Not very hard  Food Insecurity: No Food Insecurity    Worried About Charity fundraiser in the Last Year: Never true   Arboriculturist in the Last Year: Never true  Transportation Needs: Public librarian (Medical): Yes   Lack of Transportation (Non-Medical): Yes  Physical Activity: Inactive   Days of Exercise per Week: 0 days   Minutes of Exercise per Session: 0 min  Stress: No Stress Concern Present   Feeling of Stress : Not at all  Social Connections: Socially Isolated   Frequency of Communication with Friends and Family: More than three times a week   Frequency of Social Gatherings with Friends and Family: More than three times a week   Attends Religious Services: Never   Marine scientist or Organizations: No   Attends Archivist Meetings: Never   Marital Status: Separated  Intimate Partner Violence: Not At Risk   Fear of Current or Ex-Partner: No   Emotionally Abused: No   Physically Abused: No   Sexually Abused: No    FAMILY  HISTORY: Family History  Problem Relation Age of Onset   Diabetes Mother     ALLERGIES:  has No Known Allergies.  MEDICATIONS:  Current Outpatient Medications  Medication Sig Dispense Refill   atorvastatin (LIPITOR) 10 MG tablet Take 1 tablet (10 mg total) by mouth daily at 6 PM. 90 tablet 1   Cal Carb-Mag Hydrox-Simeth (ROLAIDS ADVANCED) 1000-200-40 MG CHEW Chew 1 tablet by mouth daily as needed (for indigestion).     clopidogrel (PLAVIX) 75 MG tablet Take 1 tablet (75 mg total) by mouth daily. 90 tablet 1   ferrous sulfate 324 MG TBEC Take 324 mg by mouth daily with breakfast.     levETIRAcetam (KEPPRA) 500 MG tablet Take 1 tablet (500 mg total) by mouth 2 (two) times daily. 60 tablet 2   metFORMIN (GLUCOPHAGE) 500 MG tablet Take 1 tablet (500 mg total) by mouth 2 (two) times daily with a meal. 60 tablet 2   NARCAN 4 MG/0.1ML LIQD nasal spray kit SMARTSIG:Spray(s) Both Nares     oxyCODONE (OXYCONTIN) 10 mg 12 hr tablet Take 1 tablet (10 mg total)  by mouth every 12 (twelve) hours. 60 tablet 0   oxyCODONE-acetaminophen (PERCOCET) 10-325 MG tablet Take 1 tablet by mouth every 4 (four) hours as needed.     pantoprazole (PROTONIX) 40 MG tablet Take 1 tablet (40 mg total) by mouth daily at 6 (six) AM. 30 tablet 0   traZODone (DESYREL) 50 MG tablet Take 1 tablet (50 mg total) by mouth at bedtime. 30 tablet 3   acetaminophen (TYLENOL) 325 MG tablet Take 2 tablets (650 mg total) by mouth every 4 (four) hours as needed for headache or mild pain. (Patient not taking: Reported on 11/24/2020)     loperamide (IMODIUM) 2 MG capsule Take 1 capsule (2 mg total) by mouth as needed for diarrhea or loose stools. (Patient not taking: Reported on 11/24/2020) 30 capsule 3   No current facility-administered medications for this visit.    REVIEW OF SYSTEMS:   Review of Systems  Constitutional:  Positive for appetite change and fatigue.  Eyes:  Positive for eye problems (double vision).  Respiratory:  Negative for cough.   Cardiovascular:  Positive for chest pain.  Gastrointestinal:  Positive for abdominal pain (7/10 stomach), constipation, diarrhea, nausea and vomiting.  Neurological:  Positive for headaches.  All other systems reviewed and are negative.   PHYSICAL EXAMINATION: ECOG PERFORMANCE STATUS: 3 - Symptomatic, >50% confined to bed  There were no vitals filed for this visit. There were no vitals filed for this visit. Physical Exam Vitals reviewed.  Constitutional:      Appearance: Normal appearance.  Cardiovascular:     Rate and Rhythm: Normal rate and regular rhythm.     Pulses: Normal pulses.     Heart sounds: Normal heart sounds.  Pulmonary:     Effort: Pulmonary effort is normal.     Breath sounds: Normal breath sounds.  Musculoskeletal:     Right Lower Extremity: Right leg is amputated above knee.     Left Lower Extremity: Left leg is amputated below knee.  Neurological:     General: No focal deficit present.     Mental Status: He  is alert and oriented to person, place, and time.  Psychiatric:        Mood and Affect: Mood normal.        Behavior: Behavior normal.     LABORATORY DATA:  I have reviewed the data as listed CBC Latest Ref  Rng & Units 11/01/2020 10/31/2020 10/30/2020  WBC 4.0 - 10.5 K/uL 11.0(H) 13.1(H) 17.1(H)  Hemoglobin 13.0 - 17.0 g/dL 7.5(L) 8.1(L) 8.0(L)  Hematocrit 39.0 - 52.0 % 24.3(L) 25.5(L) 25.5(L)  Platelets 150 - 400 K/uL 454(H) 449(H) 419(H)   CMP Latest Ref Rng & Units 10/31/2020 10/29/2020 10/27/2020  Glucose 70 - 99 mg/dL 144(H) 125(H) 102(H)  BUN 8 - 23 mg/dL 7(L) 5(L) 8  Creatinine 0.61 - 1.24 mg/dL 0.45(L) 0.36(L) 0.45(L)  Sodium 135 - 145 mmol/L 136 138 137  Potassium 3.5 - 5.1 mmol/L 3.3(L) 3.7 3.6  Chloride 98 - 111 mmol/L 103 106 105  CO2 22 - 32 mmol/L _0 Calcium 8.9 - 10.3 mg/dL 7.7(L) 8.1(L) 8.1(L)  Total Protein 6.5 - 8.1 g/dL - - -  Total Bilirubin 0.3 - 1.2 mg/dL - - -  Alkaline Phos 38 - 126 U/L - - -  AST 15 - 41 U/L - - -  ALT 0 - 44 U/L - - -    RADIOGRAPHIC STUDIES: I have personally reviewed the radiological images as listed and agreed with the findings in the report. CT CHEST W CONTRAST  Result Date: 10/31/2020 CLINICAL DATA:  Colorectal cancer staging EXAM: CT CHEST WITH CONTRAST TECHNIQUE: Multidetector CT imaging of the chest was performed during intravenous contrast administration. CONTRAST:  38m OMNIPAQUE IOHEXOL 300 MG/ML  SOLN COMPARISON:  CT abdomen pelvis, 10/29/2020, CT chest abdomen pelvis, 06/28/2020 FINDINGS: Cardiovascular: Incidental note of segmental to subsegmental pulmonary embolus in the left lung (series 3, image 68, 59).). Normal heart size. Left coronary artery calcifications. No pericardial effusion. Aortic atherosclerosis. Mediastinum/Nodes: Enlarged right hilar/subcarinal lymph nodes are diminished in size, largest node measuring 2.6 x 1.5 cm, previously 3.3 x 2.3 cm (series 3, image 50). Thyroid gland, trachea, and esophagus  demonstrate no significant findings. Lungs/Pleura: Large right upper lobe mass is somewhat diminished in size compared to prior staging examination dated 06/27/2020, measuring approximately 6.0 x 5.4 cm, previously 8.0 x 6.4 cm (series 5, image 44). New moderate left, small right pleural effusions and associated atelectasis or consolidation. Upper Abdomen: No acute abnormality. Musculoskeletal: No chest wall mass or suspicious bone lesions identified. IMPRESSION: 1. Large right upper lobe mass is somewhat diminished in size compared to prior staging examination dated 06/27/2020, measuring approximately 6.0 x 5.4 cm, previously 8.0 x 6.4 cm. 2. Enlarged right hilar/subcarinal lymph nodes are diminished in size, largest node measuring 2.6 x 1.5 cm, previously 3.3 x 2.3 cm. 3. Incidental note of segmental to subsegmental pulmonary embolus in the left lung. 4. New moderate left, small right pleural effusions and associated atelectasis or consolidation. Left-sided effusion is perhaps reactive to pulmonary embolus. 5. Coronary artery disease. These results will be called to the ordering clinician or representative by the Radiologist Assistant, and communication documented in the PACS or [employer] Aortic Atherosclerosis (ICD10-I70.0). Electronically Signed   By: AEddie CandleM.D.   On: 10/31/2020 17:41   CT ABDOMEN PELVIS W CONTRAST  Result Date: 10/29/2020 CLINICAL DATA:  Metastatic disease evaluation EXAM: CT ABDOMEN AND PELVIS WITH CONTRAST TECHNIQUE: Multidetector CT imaging of the abdomen and pelvis was performed using the standard protocol following bolus administration of intravenous contrast. CONTRAST:  777mOMNIPAQUE IOHEXOL 300 MG/ML  SOLN COMPARISON:  June 27, 2020 FINDINGS: Lower chest: Small LEFT pleural effusion. Trace RIGHT pleural effusion. New irregular consolidative opacity of the lingula. LEFT basilar atelectasis. Unchanged ground-glass opacity of the RIGHT lower lobe measuring 7 mm (series  4, image  10). Increasesd centrilobular ground-glass within the RIGHT middle lobe. Hepatobiliary: No focal liver abnormality is seen. No gallstones, gallbladder wall thickening, or biliary dilatation. Pancreas: Unremarkable. No pancreatic ductal dilatation or surrounding inflammatory changes. Spleen: Unchanged appearance of a partially calcified lobulated spleen in the LEFT upper quadrant. Adrenals/Urinary Tract: Nodular thickening of the LEFT adrenal gland, unchanged. RIGHT adrenal gland is unremarkable. No hydronephrosis. LEFT-sided renal cysts. Bladder stone. Stomach/Bowel: In the third portion of the duodenum, there is focal irregular bowel wall thickening anteriorly. It measures approximately 2.6 x 1.1 cm (series 3, image 35). The folds of the proximal jejunum are thickened in appearance with several areas of additional focal irregular bowel wall thickening within the jejunum (series 3, image 33 cyst with series 6, image 50-60; series 3, image 45 with series 6, image 85). Appendix is normal. No evidence of bowel obstruction. Vascular/Lymphatic: Atherosclerotic calcifications of the nonaneurysmal aorta. IVC filter. Reproductive: Prostatomegaly with marked hypertrophy of the median lobe with associated calcification. Other: Trace free fluid.  No free air. Musculoskeletal: Osteopenia.  No aggressive osseous lesion. IMPRESSION: 1. There is irregular bowel wall thickening within the third portion of the duodenum. In addition, there is irregular bowel wall thickening in several portions of the proximal jejunum. Findings are concerning for malignancy with differential considerations including multifocal small-bowel adenocarcinoma, lymphoma or metastatic disease from known lung primary. 2. No evidence of bowel obstruction. 3. New irregular consolidation of the lingula and scattered ground-glass opacities of the RIGHT middle lobe. Findings are likely infectious or inflammatory in etiology. Recommend attention on  follow-up as per clinical protocol. 4. Small LEFT pleural effusion and trace RIGHT pleural effusion. Small volume ascites. 5. Prostatomegaly with revisualization of a bladder stone. Aortic Atherosclerosis (ICD10-I70.0). Electronically Signed   By: Valentino Saxon MD   On: 10/29/2020 20:08   VAS [name] LOWER EXTREMITY VENOUS (DVT)  Result Date: 11/01/2020  Lower Venous DVT Study Patient Name:  NIJEL FLINK  Date of Exam:   11/01/2020 Medical Rec #: [id number]        Accession #:    [id number] Date of Birth: April 29, 1940        Patient Gender: M Patient Age:   80Y Exam Location:  [facility] Procedure:      VAS [name] LOWER EXTREMITY VENOUS (DVT) Referring Phys: [id number] AMRIT ADHIKARI --------------------------------------------------------------------------------  Indications: Pulmonary embolism, and Pain in left leg.  Risk Factors: Surgery Right AKA on 10/23/20, Left BKA 09/22/18. Comparison Study: 06/28/20- LEV, negative. Performing Technologist: Oda Cogan RDMS, RVT  Examination Guidelines: A complete evaluation includes B-mode imaging, spectral Doppler, color Doppler, and power Doppler as needed of all accessible portions of each vessel. Bilateral testing is considered an integral part of a complete examination. Limited examinations for reoccurring indications may be performed as noted. The reflux portion of the exam is performed with the patient in reverse Trendelenburg.  +---------+---------------+---------+-----------+----------+--------------+ RIGHT    CompressibilityPhasicitySpontaneityPropertiesThrombus Aging +---------+---------------+---------+-----------+----------+--------------+ CFV      Full           Yes      Yes                                 +---------+---------------+---------+-----------+----------+--------------+ SFJ      Full                                                        +---------+---------------+---------+-----------+----------+--------------+  FV Prox  Full                                                         +---------+---------------+---------+-----------+----------+--------------+ FV Mid   Full                                                        +---------+---------------+---------+-----------+----------+--------------+ FV DistalFull           Yes      Yes                                 +---------+---------------+---------+-----------+----------+--------------+ PFV      Full                                                        +---------+---------------+---------+-----------+----------+--------------+ POP                                                   AKA            +---------+---------------+---------+-----------+----------+--------------+   +---------+---------------+---------+-----------+----------+--------------+ LEFT     CompressibilityPhasicitySpontaneityPropertiesThrombus Aging +---------+---------------+---------+-----------+----------+--------------+ CFV      Partial        Yes      Yes                  Chronic        +---------+---------------+---------+-----------+----------+--------------+ SFJ      Partial                                      Chronic        +---------+---------------+---------+-----------+----------+--------------+ FV Prox  Full                                                        +---------+---------------+---------+-----------+----------+--------------+ FV Mid   Full                                                        +---------+---------------+---------+-----------+----------+--------------+ FV DistalFull                                                        +---------+---------------+---------+-----------+----------+--------------+ PFV      Full                                                        +---------+---------------+---------+-----------+----------+--------------+  POP      Full           Yes      Yes                                  +---------+---------------+---------+-----------+----------+--------------+ PTV                                                   BKA            +---------+---------------+---------+-----------+----------+--------------+     Summary: RIGHT: - There is no evidence of deep vein thrombosis in the lower extremity.  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  *See table(s) above for measurements and observations. Electronically signed by Deitra Mayo MD on 11/01/2020 at 5:55:23 PM.    Final     ASSESSMENT:  1.  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 - SRS to the brain lesions from 08/25/2020 through 08/31/2020. - Right lung radiation therapy from 08/13/2020 through 09/16/2020. - CT chest on 10/31/2020 with large right upper lobe mass measuring 6 x 5.4 cm.  Enlarged right hilar, subcarinal lymph nodes measuring 2.6 x 1.5 cm.  New moderate left and small right pleural effusions with associated atelectasis or consolidation. - CTAP on 10/29/2020 with irregular bowel wall thickening of the duodenum and proximal jejunum. - Distal small bowel biopsy on 10/29/2020 consistent with metastatic poorly differentiated carcinoma. - Caris testing showed TMB high, PD-L1 TPS 10%.  No other targetable mutations.  2.  Social/family history: - He is living with his brother at home.  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 - He is wheelchair dependent.  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 - Current active smoker, 1 pack/day for 40 to 50 years. - 2 maternal aunts died of cancer.   PLAN:  1.  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 - Patient spends  most part of the day in the bed and sleeps most of the time.  He has decreased appetite and eat once daily. - We had a prolonged discussion about his poor performance status.  Even though his TMB is high, I do not believe he is a good candidate for immunotherapy because of his very poor performance status. - We have talked about best supportive care in the form of hospice. - He and his brother are agreeable.  We will make a referral to hospice program.   All questions were answered. The patient knows to call the clinic with any problems, questions or concerns.   Derek Jack, MD, 11/24/20 6:38 PM  [facility] [phone]   I, Thana Ates, am acting as a scribe for Dr. Derek Jack.  I, Derek Jack MD, have reviewed the above documentation for accuracy and completeness, and I agree with the above.

## 2020-11-24 ENCOUNTER — Inpatient Hospital Stay (HOSPITAL_COMMUNITY): Payer: Medicare HMO | Attending: Hematology | Admitting: Hematology

## 2020-11-24 ENCOUNTER — Encounter (HOSPITAL_COMMUNITY): Payer: Self-pay | Admitting: Hematology

## 2020-11-24 ENCOUNTER — Other Ambulatory Visit: Payer: Self-pay

## 2020-11-24 DIAGNOSIS — M79605 Pain in left leg: Secondary | ICD-10-CM | POA: Diagnosis not present

## 2020-11-24 DIAGNOSIS — Z923 Personal history of irradiation: Secondary | ICD-10-CM | POA: Insufficient documentation

## 2020-11-24 DIAGNOSIS — I700 Atherosclerosis of aorta: Secondary | ICD-10-CM | POA: Diagnosis not present

## 2020-11-24 DIAGNOSIS — E119 Type 2 diabetes mellitus without complications: Secondary | ICD-10-CM | POA: Diagnosis not present

## 2020-11-24 DIAGNOSIS — C784 Secondary malignant neoplasm of small intestine: Secondary | ICD-10-CM | POA: Diagnosis not present

## 2020-11-24 DIAGNOSIS — I2693 Single subsegmental pulmonary embolism without acute cor pulmonale: Secondary | ICD-10-CM | POA: Diagnosis not present

## 2020-11-24 DIAGNOSIS — R079 Chest pain, unspecified: Secondary | ICD-10-CM | POA: Insufficient documentation

## 2020-11-24 DIAGNOSIS — R5383 Other fatigue: Secondary | ICD-10-CM | POA: Insufficient documentation

## 2020-11-24 DIAGNOSIS — C7931 Secondary malignant neoplasm of brain: Secondary | ICD-10-CM | POA: Diagnosis not present

## 2020-11-24 DIAGNOSIS — Z89512 Acquired absence of left leg below knee: Secondary | ICD-10-CM | POA: Diagnosis not present

## 2020-11-24 DIAGNOSIS — C3411 Malignant neoplasm of upper lobe, right bronchus or lung: Secondary | ICD-10-CM | POA: Diagnosis not present

## 2020-11-24 DIAGNOSIS — N281 Cyst of kidney, acquired: Secondary | ICD-10-CM | POA: Insufficient documentation

## 2020-11-24 DIAGNOSIS — I2510 Atherosclerotic heart disease of native coronary artery without angina pectoris: Secondary | ICD-10-CM | POA: Insufficient documentation

## 2020-11-24 DIAGNOSIS — Z79899 Other long term (current) drug therapy: Secondary | ICD-10-CM | POA: Insufficient documentation

## 2020-11-24 DIAGNOSIS — H532 Diplopia: Secondary | ICD-10-CM | POA: Insufficient documentation

## 2020-11-24 DIAGNOSIS — Z833 Family history of diabetes mellitus: Secondary | ICD-10-CM | POA: Insufficient documentation

## 2020-11-24 DIAGNOSIS — R188 Other ascites: Secondary | ICD-10-CM | POA: Diagnosis not present

## 2020-11-24 DIAGNOSIS — R519 Headache, unspecified: Secondary | ICD-10-CM | POA: Diagnosis not present

## 2020-11-24 DIAGNOSIS — K219 Gastro-esophageal reflux disease without esophagitis: Secondary | ICD-10-CM | POA: Insufficient documentation

## 2020-11-24 DIAGNOSIS — J90 Pleural effusion, not elsewhere classified: Secondary | ICD-10-CM | POA: Diagnosis not present

## 2020-11-24 DIAGNOSIS — M8580 Other specified disorders of bone density and structure, unspecified site: Secondary | ICD-10-CM | POA: Diagnosis not present

## 2020-11-24 DIAGNOSIS — N400 Enlarged prostate without lower urinary tract symptoms: Secondary | ICD-10-CM | POA: Insufficient documentation

## 2020-11-24 DIAGNOSIS — N210 Calculus in bladder: Secondary | ICD-10-CM | POA: Insufficient documentation

## 2020-11-24 DIAGNOSIS — Z89511 Acquired absence of right leg below knee: Secondary | ICD-10-CM | POA: Diagnosis not present

## 2020-11-24 DIAGNOSIS — Z89611 Acquired absence of right leg above knee: Secondary | ICD-10-CM | POA: Insufficient documentation

## 2020-11-24 DIAGNOSIS — R109 Unspecified abdominal pain: Secondary | ICD-10-CM | POA: Insufficient documentation

## 2020-11-24 DIAGNOSIS — Z9079 Acquired absence of other genital organ(s): Secondary | ICD-10-CM | POA: Insufficient documentation

## 2020-11-24 NOTE — Patient Instructions (Signed)
[place] at [facility] Discharge Instructions  You were seen and examined today by Dr. Delton Coombes. Dr. Delton Coombes is a medical oncologist, meaning he specializes in the management of cancer diagnoses. Dr. Delton Coombes discussed your past medical history, family history of cancer and the events that led to you being here today.  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 The lung cancer has spread beyond your lungs and is also present in your brain and small bowel. This means that the cancer cannot be cured. Dr. Delton Coombes discussed treatment options. It does not appear that you would be able to tolerate chemotherapy well. Dr. Delton Coombes discussed the option of immunotherapy Beryle Flock), however it is not clear that you would tolerate Keytruda well either because of your current functional status. It could make functioning day to day more difficult. Dr. Delton Coombes discussed Palliative Care, these would be in home services to help manage symptoms related to your cancer diagnoses, including pain and nausea. Palliative Care could also be helpful in transitioning to Hospice care, which solely focuses on your comfort at home, once your functional status declines further.  We will arrange a referral to Palliative Care.   Thank you for choosing [place] at [facility] to provide your oncology and hematology care.  To afford each patient quality time with our provider, please arrive at least 15 minutes before your scheduled appointment time.   If you have a lab appointment with the [facility] please come in thru the Main Entrance and check in at the main information desk.  You need to re-schedule your appointment should you arrive 10 or more minutes late.  We strive to give you quality time with our providers, and arriving late affects you and other patients whose appointments are after yours.  Also, if you no show three or more times for appointments  you may be dismissed from the clinic at the providers discretion.     Again, thank you for choosing [facility].  Our hope is that these requests will decrease the amount of time that you wait before being seen by our physicians.       _____________________________________________________________  Should you have questions after your visit to [facility], please contact our office at [phone] and follow the prompts.  Our office hours are 8:00 a.m. and 4:30 p.m. Monday - Friday.  Please note that voicemails left after 4:00 p.m. may not be returned until the following business day.  We are closed weekends and major holidays.  You do have access to a nurse 24-7, just call the main number to the clinic [phone] and do not press any options, hold on the line and a nurse will answer the phone.    For prescription refill requests, have your pharmacy contact our office and allow 72 hours.    Due to Covid, you will need to wear a mask upon entering the hospital. If you do not have a mask, a mask will be given to you at the Main Entrance upon arrival. For doctor visits, patients may have 1 support person age 41 or older with them. For treatment visits, patients can not have anyone with them due to social distancing guidelines and our immunocompromised population.

## 2020-11-25 ENCOUNTER — Telehealth: Payer: Self-pay

## 2020-11-25 NOTE — Telephone Encounter (Signed)
1124 am.  Follow up call made to brother Eduard Clos.  He confirmed visit with oncology yesterday.  Discussed Palliative Care vs Hospice care and brother states they would like to proceed with hospice care.  Advised that I would contact oncology for an order.  1138 am.  Case reviewed with Dr. Cleon Gustin and patient is appropriate for hospice.  1205 pm  Phone call made to oncology and message has been left with nurse navigator requesting an order for hospice and also if MD would serve as attending of record while patient is under hospice.  Response pending.

## 2020-11-25 NOTE — Telephone Encounter (Signed)
1218 pm.  Incoming call from [facility] nurse navigator for oncology.  Okay for hospice orders but MD does not serve as attending of record for hospice patients.  Advised that I would follow up with hospice to see if we could accommodate this for patient.

## 2020-11-28 ENCOUNTER — Other Ambulatory Visit: Payer: Self-pay

## 2020-11-28 NOTE — Patient Outreach (Signed)
[facility]) Care Management  11/28/2020  Thomas Garcia [birth year] [id number]   Received message from Vance Gather with hospice.  Patient will be hospice.    Plan: RN CM will close case.    Jone Baseman, RN, MSN [place] [facility] Care Management Coordinator Direct Line [phone] Cell [phone] Toll Free: [phone]  Fax: [phone]

## 2020-11-29 ENCOUNTER — Telehealth (INDEPENDENT_AMBULATORY_CARE_PROVIDER_SITE_OTHER): Payer: Self-pay

## 2020-11-29 NOTE — Telephone Encounter (Signed)
[facility] faxed a Rx refill request for Lisinopril 5 mg 1 tab QD.Qty:90 tablets. But I do not see a Rx in the list of medications currently. Do you still want to continue to refill? Please advise.

## 2020-11-29 NOTE — Telephone Encounter (Signed)
Renee from hospice calls today to report that patient was admitted to their service. Gave verbal permission for them to remove the staples.

## 2020-12-01 ENCOUNTER — Ambulatory Visit (INDEPENDENT_AMBULATORY_CARE_PROVIDER_SITE_OTHER): Payer: Medicare HMO | Admitting: Internal Medicine

## 2020-12-02 ENCOUNTER — Ambulatory Visit (HOSPITAL_COMMUNITY): Admission: RE | Admit: 2020-12-02 | Payer: Medicare HMO | Source: Ambulatory Visit

## 2020-12-02 DIAGNOSIS — C3411 Malignant neoplasm of upper lobe, right bronchus or lung: Secondary | ICD-10-CM | POA: Diagnosis not present

## 2020-12-02 DIAGNOSIS — I619 Nontraumatic intracerebral hemorrhage, unspecified: Secondary | ICD-10-CM | POA: Diagnosis not present

## 2020-12-02 DIAGNOSIS — E43 Unspecified severe protein-calorie malnutrition: Secondary | ICD-10-CM | POA: Diagnosis not present

## 2020-12-02 DIAGNOSIS — C7931 Secondary malignant neoplasm of brain: Secondary | ICD-10-CM | POA: Diagnosis not present

## 2020-12-02 DIAGNOSIS — R269 Unspecified abnormalities of gait and mobility: Secondary | ICD-10-CM | POA: Diagnosis not present

## 2020-12-06 ENCOUNTER — Ambulatory Visit: Payer: Medicare HMO | Admitting: Radiation Oncology

## 2020-12-08 NOTE — Telephone Encounter (Signed)
Lisinopril was discontinued while he was in the hospital so I will not renew this prescription at this time.

## 2020-12-08 DEATH — deceased
